# Patient Record
Sex: Female | Born: 1945
Health system: Southern US, Community
[De-identification: ages and names within clinical notes are randomized; demographics above are authoritative.]

## PROBLEM LIST (undated history)

## (undated) DIAGNOSIS — N051 Unspecified nephritic syndrome with focal and segmental glomerular lesions: Secondary | ICD-10-CM

## (undated) DIAGNOSIS — Z720 Tobacco use: Secondary | ICD-10-CM

## (undated) DIAGNOSIS — K5792 Diverticulitis of intestine, part unspecified, without perforation or abscess without bleeding: Secondary | ICD-10-CM

## (undated) DIAGNOSIS — C801 Malignant (primary) neoplasm, unspecified: Secondary | ICD-10-CM

## (undated) DIAGNOSIS — I10 Essential (primary) hypertension: Secondary | ICD-10-CM

## (undated) DIAGNOSIS — N289 Disorder of kidney and ureter, unspecified: Secondary | ICD-10-CM

## (undated) HISTORY — PX: TUBAL LIGATION: SHX77

---

## 2016-08-07 ENCOUNTER — Encounter (HOSPITAL_BASED_OUTPATIENT_CLINIC_OR_DEPARTMENT_OTHER): Payer: Self-pay | Admitting: Emergency Medicine

## 2016-08-07 ENCOUNTER — Emergency Department (HOSPITAL_BASED_OUTPATIENT_CLINIC_OR_DEPARTMENT_OTHER): Payer: Medicare Other

## 2016-08-07 ENCOUNTER — Inpatient Hospital Stay (HOSPITAL_BASED_OUTPATIENT_CLINIC_OR_DEPARTMENT_OTHER)
Admission: EM | Admit: 2016-08-07 | Discharge: 2016-08-20 | DRG: 675 | Disposition: A | Discharge status: Home / Self Care | Payer: Medicare Other | Attending: Internal Medicine | Admitting: Internal Medicine

## 2016-08-07 DIAGNOSIS — R140 Abdominal distension (gaseous): Secondary | ICD-10-CM | POA: Diagnosis not present

## 2016-08-07 DIAGNOSIS — R059 Cough, unspecified: Secondary | ICD-10-CM

## 2016-08-07 DIAGNOSIS — N269 Renal sclerosis, unspecified: Secondary | ICD-10-CM | POA: Diagnosis present

## 2016-08-07 DIAGNOSIS — E669 Obesity, unspecified: Secondary | ICD-10-CM | POA: Diagnosis present

## 2016-08-07 DIAGNOSIS — R05 Cough: Secondary | ICD-10-CM

## 2016-08-07 DIAGNOSIS — N049 Nephrotic syndrome with unspecified morphologic changes: Secondary | ICD-10-CM | POA: Diagnosis present

## 2016-08-07 DIAGNOSIS — Z720 Tobacco use: Secondary | ICD-10-CM | POA: Diagnosis present

## 2016-08-07 DIAGNOSIS — N179 Acute kidney failure, unspecified: Secondary | ICD-10-CM | POA: Diagnosis not present

## 2016-08-07 DIAGNOSIS — I10 Essential (primary) hypertension: Secondary | ICD-10-CM | POA: Diagnosis present

## 2016-08-07 DIAGNOSIS — R0609 Other forms of dyspnea: Secondary | ICD-10-CM

## 2016-08-07 DIAGNOSIS — Z6829 Body mass index (BMI) 29.0-29.9, adult: Secondary | ICD-10-CM | POA: Diagnosis not present

## 2016-08-07 DIAGNOSIS — R601 Generalized edema: Secondary | ICD-10-CM

## 2016-08-07 DIAGNOSIS — D649 Anemia, unspecified: Secondary | ICD-10-CM | POA: Diagnosis present

## 2016-08-07 DIAGNOSIS — R0600 Dyspnea, unspecified: Secondary | ICD-10-CM | POA: Diagnosis present

## 2016-08-07 DIAGNOSIS — F17210 Nicotine dependence, cigarettes, uncomplicated: Secondary | ICD-10-CM | POA: Diagnosis present

## 2016-08-07 DIAGNOSIS — R0602 Shortness of breath: Secondary | ICD-10-CM | POA: Diagnosis present

## 2016-08-07 DIAGNOSIS — I361 Nonrheumatic tricuspid (valve) insufficiency: Secondary | ICD-10-CM | POA: Diagnosis not present

## 2016-08-07 DIAGNOSIS — N19 Unspecified kidney failure: Secondary | ICD-10-CM

## 2016-08-07 DIAGNOSIS — Z79899 Other long term (current) drug therapy: Secondary | ICD-10-CM

## 2016-08-07 DIAGNOSIS — N170 Acute kidney failure with tubular necrosis: Principal | ICD-10-CM | POA: Diagnosis present

## 2016-08-07 HISTORY — DX: Tobacco use: Z72.0

## 2016-08-07 HISTORY — DX: Essential (primary) hypertension: I10

## 2016-08-07 LAB — HEPATIC FUNCTION PANEL
ALBUMIN: 1.9 g/dL — AB (ref 3.5–5.0)
ALT: 15 U/L (ref 14–54)
AST: 25 U/L (ref 15–41)
Alkaline Phosphatase: 84 U/L (ref 38–126)
BILIRUBIN DIRECT: 0.1 mg/dL (ref 0.1–0.5)
Indirect Bilirubin: 0.4 mg/dL (ref 0.3–0.9)
Total Bilirubin: 0.5 mg/dL (ref 0.3–1.2)
Total Protein: 5.4 g/dL — ABNORMAL LOW (ref 6.5–8.1)

## 2016-08-07 LAB — TROPONIN I

## 2016-08-07 LAB — CBC
HCT: 40.8 % (ref 36.0–46.0)
HEMOGLOBIN: 13.9 g/dL (ref 12.0–15.0)
MCH: 32.1 pg (ref 26.0–34.0)
MCHC: 34.1 g/dL (ref 30.0–36.0)
MCV: 94.2 fL (ref 78.0–100.0)
PLATELETS: 298 10*3/uL (ref 150–400)
RBC: 4.33 MIL/uL (ref 3.87–5.11)
RDW: 13.1 % (ref 11.5–15.5)
WBC: 7 10*3/uL (ref 4.0–10.5)

## 2016-08-07 LAB — BASIC METABOLIC PANEL
ANION GAP: 6 (ref 5–15)
BUN: 47 mg/dL — ABNORMAL HIGH (ref 6–20)
CO2: 23 mmol/L (ref 22–32)
CREATININE: 1.9 mg/dL — AB (ref 0.44–1.00)
Calcium: 7.7 mg/dL — ABNORMAL LOW (ref 8.9–10.3)
Chloride: 107 mmol/L (ref 101–111)
GFR, EST AFRICAN AMERICAN: 30 mL/min — AB (ref >60)
GFR, EST NON AFRICAN AMERICAN: 26 mL/min — AB (ref >60)
Glucose, Bld: 106 mg/dL — ABNORMAL HIGH (ref 65–99)
Potassium: 4.1 mmol/L (ref 3.5–5.1)
SODIUM: 136 mmol/L (ref 135–145)

## 2016-08-07 LAB — BRAIN NATRIURETIC PEPTIDE: B NATRIURETIC PEPTIDE 5: 42 pg/mL (ref 0.0–100.0)

## 2016-08-07 MED ORDER — CEFTRIAXONE SODIUM 1 G IJ SOLR
1.0000 g | Freq: Once | INTRAMUSCULAR | Status: AC
  Administered 2016-08-07: 1 g via INTRAVENOUS
  Filled 2016-08-07: qty 10

## 2016-08-07 MED ORDER — FUROSEMIDE 10 MG/ML IJ SOLN
40.0000 mg | Freq: Once | INTRAMUSCULAR | Status: AC
  Administered 2016-08-07: 40 mg via INTRAVENOUS
  Filled 2016-08-07: qty 4

## 2016-08-07 MED ORDER — AZITHROMYCIN 500 MG IV SOLR
INTRAVENOUS | Status: AC
  Filled 2016-08-07: qty 500

## 2016-08-07 MED ORDER — LEVOFLOXACIN IN D5W 500 MG/100ML IV SOLN: 500.0000 mg | Freq: Once | INTRAVENOUS | Status: DC

## 2016-08-07 MED ORDER — IPRATROPIUM-ALBUTEROL 0.5-2.5 (3) MG/3ML IN SOLN
3.0000 mL | Freq: Four times a day (QID) | RESPIRATORY_TRACT | Status: DC
  Administered 2016-08-07: 3 mL via RESPIRATORY_TRACT
  Filled 2016-08-07: qty 3

## 2016-08-07 MED ORDER — DEXTROSE 5 % IV SOLN
500.0000 mg | Freq: Once | INTRAVENOUS | Status: AC
  Administered 2016-08-07: 500 mg via INTRAVENOUS

## 2016-08-07 NOTE — ED Triage Notes (Addendum)
Patient reports that she has had swelling to her bilateral lower extremities x 1 week. Now has chest congestion into her throat and cough with nausea. Reports that she is more short of breath with laying down

## 2016-08-07 NOTE — ED Notes (Signed)
Patient transported to X-ray 

## 2016-08-07 NOTE — ED Provider Notes (Signed)
[facility] Provider Note   CSN: [id number] Arrival date & time: 08/07/16  1754   By signing my name below, I, Charolotte Eke, attest that this documentation has been prepared under the direction and in the presence of Charlesetta Shanks, MD. Electronically Signed: Charolotte Eke, Scribe. 08/07/16. 7:50 PM.    History   Chief Complaint Chief Complaint  Patient presents with  . Shortness of Breath  . Leg Swelling    HPI Mary Chapman is a 71 y.o. female with h/o HTN who presents to the Emergency Department complaining of moderate, persistent swelling in her ankle that began 7 days ago. Pt has never experienced anything like this before. Pt has associated SOB today, cough (multiple day, produces white foamy phlegm), congestion. Shortness of breath is exacerbated by walking. Pt states that she propped herself up on the sofa last night. PCP prescribed her 20 mg of Lasix 5 days ago and she was on it for 2 days, and then was increased to 40 for the last 3 days. Pt states that her SOB is affecting her daily activity. Pt denies CP.   The history is provided by the patient. No language interpreter was used.    Past Medical History:  Diagnosis Date  . Hypertension   . Tobacco abuse     Patient Active Problem List   Diagnosis Date Noted  . Essential hypertension 08/08/2016  . Dyspnea 08/08/2016  . Tobacco abuse   . AKI (acute kidney injury) ([place])   . Anasarca 08/07/2016    Past Surgical History:  Procedure Laterality Date  . TUBAL LIGATION      OB History    No data available       Home Medications    Prior to Admission medications   Medication Sig Start Date End Date Taking? Authorizing Provider  furosemide (LASIX) 40 MG tablet Take 40 mg by mouth 2 (two) times daily.    Yes Historical Provider, MD  hydrochlorothiazide (HYDRODIURIL) 25 MG tablet Take 25 mg by mouth daily.   Yes Historical Provider, MD    Family History Family History  Problem Relation Age of Onset    . Hypertension Mother   . Diabetes Mother   . Hypertension Father   . Diabetes Father   . Hypertension Sister   . Hypertension Brother     Social History Social History  Substance Use Topics  . Smoking status: Current Some Day Smoker    Packs/day: 0.50    Types: Cigarettes  . Smokeless tobacco: Never Used  . Alcohol use No     Allergies   Patient has no known allergies.   Review of Systems Review of Systems  Respiratory: Positive for shortness of breath.     A complete 10 system review of systems was obtained and all systems are negative except as noted in the HPI and PMH.   Physical Exam Updated Vital Signs BP 126/74 (BP Location: Right Arm)   Pulse 72   Temp 98.8 F (37.1 C) (Oral)   Resp 18   Ht 5\' 6"  (1.676 m)   Wt 190 lb 7.6 oz (86.4 kg)   SpO2 100%   BMI 30.74 kg/m   Physical Exam  Constitutional: She is oriented to person, place, and time. She appears well-developed and well-nourished.  HENT:  Head: Normocephalic and atraumatic.  Neck:  Positive hepatojugular reflex.  Cardiovascular: Normal rate, regular rhythm, normal heart sounds and intact distal pulses.  Exam reveals no gallop and no friction rub.  No murmur heard. Pulmonary/Chest: Effort normal. She has wheezes. She has rales.  Expiratory bilaterally wheezes. Rales at the bases.  Musculoskeletal: She exhibits edema.  Feet and legs 2-3+ pitting edema.  Neurological: She is alert and oriented to person, place, and time. No cranial nerve deficit. She exhibits normal muscle tone. Coordination normal.  Skin: Skin is warm and dry.  No signs of cellulitis.  Psychiatric: She has a normal mood and affect.  Nursing note and vitals reviewed.    ED Treatments / Results   DIAGNOSTIC STUDIES: Oxygen Saturation is 96% on room air, adequate by my interpretation.    COORDINATION OF CARE: 7:49 PM Discussed treatment plan with pt at bedside and pt agreed to plan, which includes labwork, potential  follow up with cardiologist.  9:49 PM Consult called to Dr Hal Hope.   Labs (all labs ordered are listed, but only abnormal results are displayed) Labs Reviewed  BASIC METABOLIC PANEL - Abnormal; Notable for the following:       Result Value   Glucose, Bld 106 (*)    BUN 47 (*)    Creatinine, Ser 1.90 (*)    Calcium 7.7 (*)    GFR calc non Af Amer 26 (*)    GFR calc Af Amer 30 (*)    All other components within normal limits  HEPATIC FUNCTION PANEL - Abnormal; Notable for the following:    Total Protein 5.4 (*)    Albumin 1.9 (*)    All other components within normal limits  BASIC METABOLIC PANEL - Abnormal; Notable for the following:    Glucose, Bld 129 (*)    BUN 45 (*)    Creatinine, Ser 1.83 (*)    Calcium 7.6 (*)    GFR calc non Af Amer 27 (*)    GFR calc Af Amer 31 (*)    All other components within normal limits  LIPID PANEL - Abnormal; Notable for the following:    Cholesterol 281 (*)    Triglycerides 196 (*)    LDL Cholesterol 185 (*)    All other components within normal limits  URINALYSIS, ROUTINE W REFLEX MICROSCOPIC - Abnormal; Notable for the following:    APPearance HAZY (*)    Glucose, UA 50 (*)    Hgb urine dipstick MODERATE (*)    Protein, ur >=300 (*)    Squamous Epithelial / LPF 0-5 (*)    All other components within normal limits  CBC - Abnormal; Notable for the following:    RBC 3.26 (*)    Hemoglobin 10.4 (*)    HCT 31.1 (*)    All other components within normal limits  COMPREHENSIVE METABOLIC PANEL - Abnormal; Notable for the following:    Glucose, Bld 104 (*)    BUN 43 (*)    Creatinine, Ser 1.68 (*)    Calcium 7.6 (*)    Total Protein 4.6 (*)    Albumin 2.3 (*)    ALT 10 (*)    Total Bilirubin 0.1 (*)    GFR calc non Af Amer 30 (*)    GFR calc Af Amer 34 (*)    All other components within normal limits  RENAL FUNCTION PANEL - Abnormal; Notable for the following:    Potassium 3.3 (*)    Glucose, Bld 100 (*)    BUN 34 (*)     Creatinine, Ser 1.22 (*)    Calcium 7.8 (*)    Phosphorus 5.0 (*)    Albumin 2.1 (*)  GFR calc non Af Amer 44 (*)    GFR calc Af Amer 51 (*)    All other components within normal limits  CBC - Abnormal; Notable for the following:    RBC 3.81 (*)    All other components within normal limits  PROTEIN ELECTROPHORESIS, SERUM - Abnormal; Notable for the following:    Total Protein ELP 4.5 (*)    Albumin ELP 2.0 (*)    All other components within normal limits  IRON AND TIBC - Abnormal; Notable for the following:    TIBC 115 (*)    All other components within normal limits  RETICULOCYTES - Abnormal; Notable for the following:    RBC. 3.81 (*)    All other components within normal limits  BASIC METABOLIC PANEL - Abnormal; Notable for the following:    BUN 32 (*)    Creatinine, Ser 1.36 (*)    Calcium 7.7 (*)    GFR calc non Af Amer 38 (*)    GFR calc Af Amer 45 (*)    All other components within normal limits  CBC - Abnormal; Notable for the following:    RBC 3.77 (*)    Hemoglobin 11.9 (*)    All other components within normal limits  RENAL FUNCTION PANEL - Abnormal; Notable for the following:    Glucose, Bld 110 (*)    BUN 42 (*)    Creatinine, Ser 1.87 (*)    Calcium 7.8 (*)    Phosphorus 5.3 (*)    Albumin 1.8 (*)    GFR calc non Af Amer 26 (*)    GFR calc Af Amer 30 (*)    All other components within normal limits  CULTURE, EXPECTORATED SPUTUM-ASSESSMENT  CULTURE, RESPIRATORY (NON-EXPECTORATED)  CBC  TROPONIN I  BRAIN NATRIURETIC PEPTIDE  CBC WITH DIFFERENTIAL/PLATELET  RAPID URINE DRUG SCREEN, HOSP PERFORMED  HIV ANTIBODY (ROUTINE TESTING)  PROTIME-INR  GLUCOSE, CAPILLARY  PROTEIN / CREATININE RATIO, URINE  ANTINUCLEAR ANTIBODIES, IFA  HEPATITIS PANEL, ACUTE  VITAMIN B12  FOLATE  FERRITIN  SURGICAL PATHOLOGY    EKG  EKG Interpretation  Date/Time:  Thursday August 07 2016 18:11:40 EDT Ventricular Rate:  83 PR Interval:  160 QRS Duration: 74 QT  Interval:  366 QTC Calculation: 430 R Axis:   33 Text Interpretation:  Normal sinus rhythm Normal ECG Confirmed by Johnney Killian, MD, Jeannie Done [phone]) on 08/07/2016 7:37:41 PM Also confirmed by Johnney Killian, [place] [phone]), editor [place]  CCT, [place] ([id number])  on 08/08/2016 6:41:21 AM       Radiology US Biopsy  Result Date: 08/11/2016 INDICATION: Necrotic center EXAM: ULTRASOUND-GUIDED BIOPSY RANDOM RENAL CORTEX CORE MEDICATIONS: None. ANESTHESIA/SEDATION: Fentanyl 50 mcg IV; Versed 1 mg IV Moderate Sedation Time:  10 The patient was continuously monitored during the procedure by the interventional radiology nurse under my direct supervision. FLUOROSCOPY TIME:  None. COMPLICATIONS: None immediate. PROCEDURE: Informed written consent was obtained from the patient after a thorough discussion of the procedural risks, benefits and alternatives. All questions were addressed. Maximal Sterile Barrier Technique was utilized including caps, mask, sterile gowns, sterile gloves, sterile drape, hand hygiene and skin antiseptic. A timeout was performed prior to the initiation of the procedure. The back was prepped with ChloraPrep in a sterile fashion, and a sterile drape was applied covering the operative field. A sterile gown and sterile gloves were used for the procedure. Under sonographic guidance, 2 16 gauge core biopsies of the left lower pole renal cortex were obtained. Final imaging was performed.  Patient tolerated the procedure well without complication. Vital sign monitoring by nursing staff during the procedure will continue as patient is in the special procedures unit for post procedure observation. FINDINGS: The images document guide needle placement within the left lower pole renal cortex. Post biopsy images demonstrate no hemorrhage. IMPRESSION: Successful ultrasound-guided core biopsy of the left lower pole renal cortex. Electronically Signed   By: Marybelle Killings M.D.   On: 08/11/2016 12:13     Procedures Procedures (including critical care time)  Medications Ordered in ED Medications  azithromycin (ZITHROMAX) 500 MG injection (not administered)  hydrALAZINE (APRESOLINE) injection 5 mg (not administered)  amLODipine (NORVASC) tablet 10 mg (10 mg Oral Given 08/12/16 0855)  albuterol (PROVENTIL) (2.5 MG/3ML) 0.083% nebulizer solution 2.5 mg (not administered)  enoxaparin (LOVENOX) injection 40 mg (40 mg Subcutaneous Given 08/12/16 0855)  sodium chloride flush (NS) 0.9 % injection 3 mL (3 mLs Intravenous Given 08/11/16 2143)  acetaminophen (TYLENOL) tablet 650 mg (650 mg Oral Given 08/09/16 1430)    Or  acetaminophen (TYLENOL) suppository 650 mg ( Rectal See Alternative 08/09/16 1430)  ondansetron (ZOFRAN) tablet 4 mg (not administered)    Or  ondansetron (ZOFRAN) injection 4 mg (not administered)  zolpidem (AMBIEN) tablet 5 mg (not administered)  nicotine (NICODERM CQ - dosed in mg/24 hours) patch 21 mg (21 mg Transdermal Patch Applied 08/12/16 1424)  azithromycin (ZITHROMAX) tablet 250 mg (250 mg Oral Given 08/12/16 1003)  ipratropium-albuterol (DUONEB) 0.5-2.5 (3) MG/3ML nebulizer solution 3 mL (3 mLs Nebulization Given 08/12/16 1320)  atorvastatin (LIPITOR) tablet 20 mg (20 mg Oral Given 08/11/16 1757)  metoprolol tartrate (LOPRESSOR) tablet 25 mg (25 mg Oral Given 08/12/16 0854)  fentaNYL (SUBLIMAZE) 100 MCG/2ML injection (not administered)  midazolam (VERSED) 2 MG/2ML injection (not administered)  lidocaine (XYLOCAINE) 1 % (with pres) injection (not administered)  torsemide (DEMADEX) tablet 100 mg (100 mg Oral Given 08/12/16 0854)  metolazone (ZAROXOLYN) tablet 5 mg (5 mg Oral Given 08/12/16 1422)  furosemide (LASIX) injection 40 mg (40 mg Intravenous Given 08/07/16 2219)  cefTRIAXone (ROCEPHIN) 1 g in dextrose 5 % 50 mL IVPB (0 g Intravenous Stopped 08/07/16 2307)  azithromycin (ZITHROMAX) 500 mg in dextrose 5 % 250 mL IVPB (0 mg Intravenous Stopped 08/08/16 0010)  potassium  chloride SA (K-DUR,KLOR-CON) CR tablet 40 mEq (40 mEq Oral Given 08/10/16 0938)  midazolam (VERSED) injection (1 mg Intravenous Given 08/11/16 1122)  fentaNYL (SUBLIMAZE) injection (50 mcg Intravenous Given 08/11/16 1122)     Initial Impression / Assessment and Plan / ED Course  I have reviewed the triage vital signs and the nursing notes.  Pertinent labs & imaging results that were available during my care of the patient were reviewed by me and considered in my medical decision making (see chart for details).     Consultation: [facility] hospitalist for admission. Dr. Rozann Lesches requests UA and IV lasix for probable nephrotic syndrome of unclear etiology.  Final Clinical Impressions(s) / ED Diagnoses   Final diagnoses:  AKI (acute kidney injury) ([place])  Anasarca  Dyspnea on exertion  Patient has renal insufficiency and diffuse edema consistent with anasarca. She has not had fever or chills. Chest x-ray and BMP do not suggest congestive heart failure. Patient is however experiencing exertional dyspnea and fatigue that are worsening despite treatment with Lasix as outpatient. Patient will be admitted for further diagnostic evaluation and increased diuresis as per discussion with Dr.Kakracandy and diagnostic evaluation for nephrotic syndrome.  New Prescriptions Current Discharge Medication List  Charlesetta Shanks, MD 08/12/16 [phone]

## 2016-08-08 ENCOUNTER — Inpatient Hospital Stay (HOSPITAL_COMMUNITY): Payer: Medicare Other

## 2016-08-08 ENCOUNTER — Encounter (HOSPITAL_COMMUNITY): Payer: Self-pay | Admitting: Internal Medicine

## 2016-08-08 DIAGNOSIS — R0600 Dyspnea, unspecified: Secondary | ICD-10-CM | POA: Diagnosis present

## 2016-08-08 DIAGNOSIS — R601 Generalized edema: Secondary | ICD-10-CM

## 2016-08-08 DIAGNOSIS — I10 Essential (primary) hypertension: Secondary | ICD-10-CM

## 2016-08-08 DIAGNOSIS — I361 Nonrheumatic tricuspid (valve) insufficiency: Secondary | ICD-10-CM

## 2016-08-08 DIAGNOSIS — N179 Acute kidney failure, unspecified: Secondary | ICD-10-CM | POA: Diagnosis present

## 2016-08-08 DIAGNOSIS — Z720 Tobacco use: Secondary | ICD-10-CM

## 2016-08-08 DIAGNOSIS — R0602 Shortness of breath: Secondary | ICD-10-CM

## 2016-08-08 LAB — EXPECTORATED SPUTUM ASSESSMENT W REFEX TO RESP CULTURE

## 2016-08-08 LAB — URINALYSIS, ROUTINE W REFLEX MICROSCOPIC
Bacteria, UA: NONE SEEN
Bilirubin Urine: NEGATIVE
Glucose, UA: 50 mg/dL — AB
KETONES UR: NEGATIVE mg/dL
Leukocytes, UA: NEGATIVE
Nitrite: NEGATIVE
Specific Gravity, Urine: 1.029 (ref 1.005–1.030)
pH: 5 (ref 5.0–8.0)

## 2016-08-08 LAB — BASIC METABOLIC PANEL
Anion gap: 9 (ref 5–15)
BUN: 45 mg/dL — AB (ref 6–20)
CHLORIDE: 102 mmol/L (ref 101–111)
CO2: 25 mmol/L (ref 22–32)
CREATININE: 1.83 mg/dL — AB (ref 0.44–1.00)
Calcium: 7.6 mg/dL — ABNORMAL LOW (ref 8.9–10.3)
GFR calc Af Amer: 31 mL/min — ABNORMAL LOW (ref >60)
GFR calc non Af Amer: 27 mL/min — ABNORMAL LOW (ref >60)
Glucose, Bld: 129 mg/dL — ABNORMAL HIGH (ref 65–99)
Potassium: 3.8 mmol/L (ref 3.5–5.1)
SODIUM: 136 mmol/L (ref 135–145)

## 2016-08-08 LAB — LIPID PANEL
CHOL/HDL RATIO: 4.9 ratio
Cholesterol: 281 mg/dL — ABNORMAL HIGH (ref 0–200)
HDL: 57 mg/dL (ref >40)
LDL CALC: 185 mg/dL — AB (ref 0–99)
Triglycerides: 196 mg/dL — ABNORMAL HIGH (ref <150)
VLDL: 39 mg/dL (ref 0–40)

## 2016-08-08 LAB — CBC WITH DIFFERENTIAL/PLATELET
Basophils Absolute: 0 10*3/uL (ref 0.0–0.1)
Basophils Relative: 0 %
EOS PCT: 2 %
Eosinophils Absolute: 0.1 10*3/uL (ref 0.0–0.7)
HCT: 39 % (ref 36.0–46.0)
Hemoglobin: 13 g/dL (ref 12.0–15.0)
LYMPHS PCT: 22 %
Lymphs Abs: 1.4 10*3/uL (ref 0.7–4.0)
MCH: 31.6 pg (ref 26.0–34.0)
MCHC: 33.3 g/dL (ref 30.0–36.0)
MCV: 94.9 fL (ref 78.0–100.0)
MONO ABS: 0.8 10*3/uL (ref 0.1–1.0)
Monocytes Relative: 12 %
Neutro Abs: 4.2 10*3/uL (ref 1.7–7.7)
Neutrophils Relative %: 64 %
PLATELETS: 292 10*3/uL (ref 150–400)
RBC: 4.11 MIL/uL (ref 3.87–5.11)
RDW: 13.3 % (ref 11.5–15.5)
WBC: 6.5 10*3/uL (ref 4.0–10.5)

## 2016-08-08 LAB — PROTEIN / CREATININE RATIO, URINE: CREATININE, URINE: 147.64 mg/dL

## 2016-08-08 LAB — EXPECTORATED SPUTUM ASSESSMENT W GRAM STAIN, RFLX TO RESP C

## 2016-08-08 LAB — RAPID URINE DRUG SCREEN, HOSP PERFORMED
AMPHETAMINES: NOT DETECTED
Barbiturates: NOT DETECTED
Benzodiazepines: NOT DETECTED
Cocaine: NOT DETECTED
Opiates: NOT DETECTED
Tetrahydrocannabinol: NOT DETECTED

## 2016-08-08 LAB — ECHOCARDIOGRAM COMPLETE
Height: 66 in
WEIGHTICAEL: 3156.99 [oz_av]

## 2016-08-08 LAB — GLUCOSE, CAPILLARY: Glucose-Capillary: 98 mg/dL (ref 65–99)

## 2016-08-08 LAB — PROTIME-INR
INR: 1.07
PROTHROMBIN TIME: 13.9 s (ref 11.4–15.2)

## 2016-08-08 LAB — HIV ANTIBODY (ROUTINE TESTING W REFLEX): HIV SCREEN 4TH GENERATION: NONREACTIVE

## 2016-08-08 MED ORDER — ATORVASTATIN CALCIUM 20 MG PO TABS
20.0000 mg | ORAL_TABLET | Freq: Every day | ORAL | Status: DC
  Administered 2016-08-08 – 2016-08-19 (×11): 20 mg via ORAL
  Filled 2016-08-08 (×11): qty 1

## 2016-08-08 MED ORDER — AZITHROMYCIN 250 MG PO TABS
250.0000 mg | ORAL_TABLET | Freq: Every day | ORAL | Status: DC
  Administered 2016-08-08 – 2016-08-14 (×7): 250 mg via ORAL
  Filled 2016-08-08 (×7): qty 1

## 2016-08-08 MED ORDER — ALBUMIN HUMAN 25 % IV SOLN
100.0000 g | Freq: Once | INTRAVENOUS | Status: DC
  Filled 2016-08-08: qty 400

## 2016-08-08 MED ORDER — IPRATROPIUM-ALBUTEROL 0.5-2.5 (3) MG/3ML IN SOLN
3.0000 mL | Freq: Four times a day (QID) | RESPIRATORY_TRACT | Status: DC
  Administered 2016-08-08 – 2016-08-14 (×23): 3 mL via RESPIRATORY_TRACT
  Filled 2016-08-08 (×25): qty 3

## 2016-08-08 MED ORDER — ACETAMINOPHEN 650 MG RE SUPP: 650.0000 mg | Freq: Four times a day (QID) | RECTAL | Status: DC | PRN

## 2016-08-08 MED ORDER — FUROSEMIDE 10 MG/ML IJ SOLN
160.0000 mg | Freq: Two times a day (BID) | INTRAMUSCULAR | Status: DC
  Administered 2016-08-08 – 2016-08-09 (×2): 160 mg via INTRAVENOUS
  Filled 2016-08-08 (×2): qty 16

## 2016-08-08 MED ORDER — ZOLPIDEM TARTRATE 5 MG PO TABS: 5.0000 mg | ORAL_TABLET | Freq: Every evening | ORAL | Status: DC | PRN

## 2016-08-08 MED ORDER — ALBUTEROL SULFATE (2.5 MG/3ML) 0.083% IN NEBU: 2.5000 mg | INHALATION_SOLUTION | RESPIRATORY_TRACT | Status: DC | PRN

## 2016-08-08 MED ORDER — ACETAMINOPHEN 325 MG PO TABS
650.0000 mg | ORAL_TABLET | Freq: Four times a day (QID) | ORAL | Status: DC | PRN
  Administered 2016-08-08 – 2016-08-16 (×6): 650 mg via ORAL
  Filled 2016-08-08 (×6): qty 2

## 2016-08-08 MED ORDER — ENOXAPARIN SODIUM 40 MG/0.4ML ~~LOC~~ SOLN
40.0000 mg | SUBCUTANEOUS | Status: DC
  Administered 2016-08-08 – 2016-08-14 (×6): 40 mg via SUBCUTANEOUS
  Filled 2016-08-08 (×6): qty 0.4

## 2016-08-08 MED ORDER — ONDANSETRON HCL 4 MG PO TABS: 4.0000 mg | ORAL_TABLET | Freq: Four times a day (QID) | ORAL | Status: DC | PRN

## 2016-08-08 MED ORDER — ONDANSETRON HCL 4 MG/2ML IJ SOLN: 4.0000 mg | Freq: Four times a day (QID) | INTRAMUSCULAR | Status: DC | PRN

## 2016-08-08 MED ORDER — NICOTINE 21 MG/24HR TD PT24
21.0000 mg | MEDICATED_PATCH | Freq: Every day | TRANSDERMAL | Status: DC
  Administered 2016-08-08 – 2016-08-15 (×7): 21 mg via TRANSDERMAL
  Filled 2016-08-08 (×10): qty 1

## 2016-08-08 MED ORDER — AMLODIPINE BESYLATE 10 MG PO TABS
10.0000 mg | ORAL_TABLET | Freq: Every day | ORAL | Status: DC
  Administered 2016-08-08 – 2016-08-15 (×8): 10 mg via ORAL
  Filled 2016-08-08 (×8): qty 1

## 2016-08-08 MED ORDER — HYDRALAZINE HCL 20 MG/ML IJ SOLN: 5.0000 mg | INTRAMUSCULAR | Status: DC | PRN

## 2016-08-08 MED ORDER — SODIUM CHLORIDE 0.9% FLUSH
3.0000 mL | Freq: Two times a day (BID) | INTRAVENOUS | Status: DC
  Administered 2016-08-09 – 2016-08-19 (×13): 3 mL via INTRAVENOUS

## 2016-08-08 NOTE — ED Notes (Signed)
Report given to Butch Penny, Therapist, sports at [facility].

## 2016-08-08 NOTE — Progress Notes (Signed)
  Echocardiogram 2D Echocardiogram has been performed.  Jennette Dubin 08/08/2016, 10:48 AM

## 2016-08-08 NOTE — Consult Note (Signed)
[facility] Renal Consultation Note  Requesting MD: Mardella Layman Indication for Consultation:  nephrotic syndrome  HPI:  Mary Chapman is a 71 y.o. female with past medical history significant for hypertension on the on HCTZ and tobacco use who apparently is quite healthy as has no records in the system including no lab work. She comes to the hospital with a one-week history of worsening lower extremity edema and shortness of breath. She also has a productive cough of clear sputum. She was seen by her PCP 4 days prior to admission and was started on low-dose Lasix without help at 20 mg, was then increase to 40 mg with still no help. Upon presentation she was found to have a creatinine level of 1.9 as well as an albumin of 1.9- again do not have any historical data. Urinalysis and protein creatinine ratio suggest significant proteinuria. She said that it really came on over night. She denies any new meds. Was able to get from her primary care physician creatinine of 0.9 in August 2017, creatinine 1.3 when she was seen on 08/02/16.  Albumin in September 2017 was 3.8 and then on 08/02/16 was 2.6  Creatinine, Ser  Date/Time Value Ref Range Status  08/08/2016 01:59 AM 1.83 (H) 0.44 - 1.00 mg/dL Final  08/07/2016 06:50 PM 1.90 (H) 0.44 - 1.00 mg/dL Final     PMHx:   Past Medical History:  Diagnosis Date  . Hypertension   . Tobacco abuse     Past Surgical History:  Procedure Laterality Date  . TUBAL LIGATION      Family Hx:  Family History  Problem Relation Age of Onset  . Hypertension Mother   . Diabetes Mother   . Hypertension Father   . Diabetes Father   . Hypertension Sister   . Hypertension Brother     Social History:  reports that she has been smoking Cigarettes.  She has been smoking about 0.50 packs per day. She has never used smokeless tobacco. She reports that she does not drink alcohol or use drugs.  Allergies: No Known Allergies  Medications: Prior to  Admission medications   Medication Sig Start Date End Date Taking? Authorizing Provider  furosemide (LASIX) 40 MG tablet Take 40 mg by mouth 2 (two) times daily.    Yes Historical Provider, MD  hydrochlorothiazide (HYDRODIURIL) 25 MG tablet Take 25 mg by mouth daily.   Yes Historical Provider, MD    I have reviewed the patient's current medications.  Labs:  Results for orders placed or performed during the hospital encounter of 08/07/16 (from the past 48 hour(s))  Basic metabolic panel     Status: Abnormal   Collection Time: 08/07/16  6:50 PM  Result Value Ref Range   Sodium 136 135 - 145 mmol/L   Potassium 4.1 3.5 - 5.1 mmol/L   Chloride 107 101 - 111 mmol/L   CO2 23 22 - 32 mmol/L   Glucose, Bld 106 (H) 65 - 99 mg/dL   BUN 47 (H) 6 - 20 mg/dL   Creatinine, Ser 1.90 (H) 0.44 - 1.00 mg/dL   Calcium 7.7 (L) 8.9 - 10.3 mg/dL   GFR calc non Af Amer 26 (L) >60 mL/min   GFR calc Af Amer 30 (L) >60 mL/min    Comment: (NOTE) The eGFR has been calculated using the CKD EPI equation. This calculation has not been validated in all clinical situations. eGFR's persistently <60 mL/min signify possible Chronic Kidney Disease.    Anion gap 6 5 -  15  CBC     Status: None   Collection Time: 08/07/16  6:50 PM  Result Value Ref Range   WBC 7.0 4.0 - 10.5 K/uL   RBC 4.33 3.87 - 5.11 MIL/uL   Hemoglobin 13.9 12.0 - 15.0 g/dL   HCT 40.8 36.0 - 46.0 %   MCV 94.2 78.0 - 100.0 fL   MCH 32.1 26.0 - 34.0 pg   MCHC 34.1 30.0 - 36.0 g/dL   RDW 13.1 11.5 - 15.5 %   Platelets 298 150 - 400 K/uL  Troponin I     Status: None   Collection Time: 08/07/16  6:50 PM  Result Value Ref Range   Troponin I <0.03 <0.03 ng/mL  Brain natriuretic peptide     Status: None   Collection Time: 08/07/16  6:50 PM  Result Value Ref Range   B Natriuretic Peptide 42.0 0.0 - 100.0 pg/mL  Hepatic function panel     Status: Abnormal   Collection Time: 08/07/16  6:50 PM  Result Value Ref Range   Total Protein 5.4 (L) 6.5 -  8.1 g/dL   Albumin 1.9 (L) 3.5 - 5.0 g/dL   AST 25 15 - 41 U/L   ALT 15 14 - 54 U/L   Alkaline Phosphatase 84 38 - 126 U/L   Total Bilirubin 0.5 0.3 - 1.2 mg/dL   Bilirubin, Direct 0.1 0.1 - 0.5 mg/dL   Indirect Bilirubin 0.4 0.3 - 0.9 mg/dL  CBC with Differential/Platelet     Status: None   Collection Time: 08/08/16  1:59 AM  Result Value Ref Range   WBC 6.5 4.0 - 10.5 K/uL   RBC 4.11 3.87 - 5.11 MIL/uL   Hemoglobin 13.0 12.0 - 15.0 g/dL   HCT 39.0 36.0 - 46.0 %   MCV 94.9 78.0 - 100.0 fL   MCH 31.6 26.0 - 34.0 pg   MCHC 33.3 30.0 - 36.0 g/dL   RDW 13.3 11.5 - 15.5 %   Platelets 292 150 - 400 K/uL   Neutrophils Relative % 64 %   Neutro Abs 4.2 1.7 - 7.7 K/uL   Lymphocytes Relative 22 %   Lymphs Abs 1.4 0.7 - 4.0 K/uL   Monocytes Relative 12 %   Monocytes Absolute 0.8 0.1 - 1.0 K/uL   Eosinophils Relative 2 %   Eosinophils Absolute 0.1 0.0 - 0.7 K/uL   Basophils Relative 0 %   Basophils Absolute 0.0 0.0 - 0.1 K/uL  HIV antibody     Status: None   Collection Time: 08/08/16  1:59 AM  Result Value Ref Range   HIV Screen 4th Generation wRfx Non Reactive Non Reactive    Comment: (NOTE) Performed At: [facility] [place], [place] [id number] Lindon Romp MD LG:[id number]   Basic metabolic panel     Status: Abnormal   Collection Time: 08/08/16  1:59 AM  Result Value Ref Range   Sodium 136 135 - 145 mmol/L   Potassium 3.8 3.5 - 5.1 mmol/L   Chloride 102 101 - 111 mmol/L   CO2 25 22 - 32 mmol/L   Glucose, Bld 129 (H) 65 - 99 mg/dL   BUN 45 (H) 6 - 20 mg/dL   Creatinine, Ser 1.83 (H) 0.44 - 1.00 mg/dL   Calcium 7.6 (L) 8.9 - 10.3 mg/dL   GFR calc non Af Amer 27 (L) >60 mL/min   GFR calc Af Amer 31 (L) >60 mL/min    Comment: (NOTE) The eGFR has been  calculated using the CKD EPI equation. This calculation has not been validated in all clinical situations. eGFR's persistently <60 mL/min signify possible Chronic Kidney Disease.    Anion gap 9 5 -  15  Protime-INR     Status: None   Collection Time: 08/08/16  1:59 AM  Result Value Ref Range   Prothrombin Time 13.9 11.4 - 15.2 seconds   INR 1.07   Lipid panel     Status: Abnormal   Collection Time: 08/08/16  1:59 AM  Result Value Ref Range   Cholesterol 281 (H) 0 - 200 mg/dL   Triglycerides 196 (H) <150 mg/dL   HDL 57 >40 mg/dL   Total CHOL/HDL Ratio 4.9 RATIO   VLDL 39 0 - 40 mg/dL   LDL Cholesterol 185 (H) 0 - 99 mg/dL    Comment:        Total Cholesterol/HDL:CHD Risk Coronary Heart Disease Risk Table                     Men   Women  1/2 Average Risk   3.4   3.3  Average Risk       5.0   4.4  2 X Average Risk   9.6   7.1  3 X Average Risk  23.4   11.0        Use the calculated Patient Ratio above and the CHD Risk Table to determine the patient's CHD Risk.        ATP III CLASSIFICATION (LDL):  <100     mg/dL   Optimal  100-129  mg/dL   Near or Above                    Optimal  130-159  mg/dL   Borderline  160-189  mg/dL   High  >190     mg/dL   Very High   Culture, expectorated sputum-assessment     Status: None   Collection Time: 08/08/16  4:02 AM  Result Value Ref Range   Specimen Description EXPECTORATED SPUTUM    Special Requests NONE    Sputum evaluation THIS SPECIMEN IS ACCEPTABLE FOR SPUTUM CULTURE    Report Status 08/08/2016 FINAL   Culture, respiratory (NON-Expectorated)     Status: None (Preliminary result)   Collection Time: 08/08/16  4:02 AM  Result Value Ref Range   Specimen Description EXPECTORATED SPUTUM    Special Requests NONE Reflexed from [id number]    Gram Stain      FEW SQUAMOUS EPITHELIAL CELLS PRESENT FEW WBC PRESENT, PREDOMINANTLY MONONUCLEAR RARE GRAM POSITIVE COCCI IN PAIRS    Culture PENDING    Report Status PENDING   Glucose, capillary     Status: None   Collection Time: 08/08/16  6:23 AM  Result Value Ref Range   Glucose-Capillary 98 65 - 99 mg/dL   Comment 1 Notify RN    Comment 2 Document in Chart   Rapid urine drug screen  (hospital performed)     Status: None   Collection Time: 08/08/16 11:28 AM  Result Value Ref Range   Opiates NONE DETECTED NONE DETECTED   Cocaine NONE DETECTED NONE DETECTED   Benzodiazepines NONE DETECTED NONE DETECTED   Amphetamines NONE DETECTED NONE DETECTED   Tetrahydrocannabinol NONE DETECTED NONE DETECTED   Barbiturates NONE DETECTED NONE DETECTED    Comment:        DRUG SCREEN FOR MEDICAL PURPOSES ONLY.  IF CONFIRMATION IS NEEDED FOR ANY PURPOSE, NOTIFY LAB WITHIN  5 DAYS.        LOWEST DETECTABLE LIMITS FOR URINE DRUG SCREEN Drug Class       Cutoff (ng/mL) Amphetamine      1000 Barbiturate      200 Benzodiazepine   419 Tricyclics       622 Opiates          300 Cocaine          300 THC              50   Protein / creatinine ratio, urine     Status: None   Collection Time: 08/08/16 11:28 AM  Result Value Ref Range   Creatinine, Urine 147.64 mg/dL   Total Protein, Urine >3,000.0 mg/dL    Comment: RESULTS CONFIRMED BY MANUAL DILUTION NO NORMAL RANGE ESTABLISHED FOR THIS TEST    Protein Creatinine Ratio        0.00 - 0.15 mg/mg[Cre]    Comment: RESULT BELOW REPORTABLE RANGE, UNABLE TO CALCULATE.   Urinalysis, Routine w reflex microscopic     Status: Abnormal   Collection Time: 08/08/16 11:28 AM  Result Value Ref Range   Color, Urine YELLOW YELLOW   APPearance HAZY (A) CLEAR   Specific Gravity, Urine 1.029 1.005 - 1.030   pH 5.0 5.0 - 8.0   Glucose, UA 50 (A) NEGATIVE mg/dL   Hgb urine dipstick MODERATE (A) NEGATIVE   Bilirubin Urine NEGATIVE NEGATIVE   Ketones, ur NEGATIVE NEGATIVE mg/dL   Protein, ur >=300 (A) NEGATIVE mg/dL   Nitrite NEGATIVE NEGATIVE   Leukocytes, UA NEGATIVE NEGATIVE   RBC / HPF 6-30 0 - 5 RBC/hpf   WBC, UA 0-5 0 - 5 WBC/hpf   Bacteria, UA NONE SEEN NONE SEEN   Squamous Epithelial / LPF 0-5 (A) NONE SEEN   Mucous PRESENT    Hyaline Casts, UA PRESENT    Granular Casts, UA PRESENT      ROS:  A comprehensive review of systems was  negative except for: Constitutional: positive for fatigue Respiratory: positive for dyspnea on exertion and wheezing Cardiovascular: positive for lower extremity edema  Physical Exam: Vitals:   08/08/16 0544 08/08/16 1430  BP: (!) 150/78 (!) 142/74  Pulse: 84 (!) 101  Resp: 18 18  Temp: 98.3 F (36.8 C) 99 F (37.2 C)     General: pleasant, c/o headache HEENT: Pupils are equally round and reactive to light, extraocular motions are intact, mucous membranes are moist Neck: Positive for JVD Heart: Tachycardic Lungs: Inspiratory and expiratory wheezes with also crackles bilaterally Abdomen: Obese, soft, nontender, nondistended Extremities: Pitting edema up to the level of the thighs Skin: Warm and dry Neuro: Alert and nonfocal  Assessment/Plan: 71 year old black female with very little past medical history presenting with what appears to be nephrotic syndrome with some acute kidney injury 1.Renal- normal creatinine in August 2017, creatinine of 1.3 last week, now creatinine of 1.9. Most likely result of this intrarenal process. Also with proteinuria which appears to be nephrotic associated with hypoalbuminemia. Patient will need a percutaneous kidney biopsy to make a diagnosis of the etiology of this.  Will also do a little bit of a serologic workup including hepatitis, HIV (was neg), ANA.  Also in the differential would be membranous GN which can sometimes be associated with malignancy so I did discuss that with the patient that she needs to have age appropriate cancer screening- she said she was behind on her mammogram, actually has a colonoscopy scheduled for next month but  has not had a Pap smear in quite some time.  Chest x-ray did not show any obvious abnormalities 2. Hypertension/volume  - volume overloaded in the setting of nephrotic syndrome. We'll give high-dose IV Lasix 3. Anemia  - not an issue at this time   Alica Shellhammer A 08/08/2016, 3:39 PM

## 2016-08-08 NOTE — Progress Notes (Signed)
[facility] PROGRESS NOTE  Mary Chapman ZDG:[id number] DOB: April 09, 1946 DOA: 08/07/2016  PCP: [facility]  Brief History/Interval Summary: 71 year old African-American female with a past medical history of hypertension who presented with complaints of shortness of breath and leg swelling. Her symptoms started about a week ago with leg swelling. She was given diuretics by her primary care provider with no improvement. She subsequently decided to come into the hospital. She was found to have elevated creatinine. She was hospitalized for further management.  Reason for Visit: Anasarca and possible acute renal failure.  Consultants: Nephrology  Procedures:   Transthoracic echocardiogram Study Conclusions  - Left ventricle: The cavity size was normal. Systolic function was   vigorous. The estimated ejection fraction was in the range of 65%   to 70%. Wall motion was normal; there were no regional wall   motion abnormalities. Doppler parameters are consistent with   abnormal left ventricular relaxation (grade 1 diastolic   dysfunction). There was no evidence of elevated ventricular   filling pressure by Doppler parameters. - Aortic valve: There was no regurgitation. - Aortic root: The aortic root was normal in size. - Mitral valve: There was mild regurgitation. - Right ventricle: Systolic function was normal. - Right atrium: The atrium was normal in size. - Tricuspid valve: There was mild regurgitation. - Pulmonic valve: There was no regurgitation. - Pulmonary arteries: Systolic pressure was within the normal   range. - Inferior vena cava: The vessel was dilated. The respirophasic   diameter changes were blunted (< 50%), consistent with elevated   central venous pressure. - Pericardium, extracardiac: There was no pericardial effusion.  Impressions: - Consider non-cardiac causes for anasarca and dyspnea.  Antibiotics: None  Subjective/Interval  History: Patient states that the leg edema has not improved any. Breathing is better. Denies any chest pain.  ROS: Denies any nausea or vomiting.  Objective:  Vital Signs  Vitals:   08/07/16 2236 08/08/16 0000 08/08/16 0116 08/08/16 0544  BP: (!) 159/86 (!) 150/84 (!) 157/76 (!) 150/78  Pulse: 83 86 82 84  Resp: 14 17 18 18   Temp: 98.5 F (36.9 C)  98.6 F (37 C) 98.3 F (36.8 C)  TempSrc: Oral  Oral Oral  SpO2: 97% 98% 97% 100%  Weight:   89.6 kg (197 lb 8 oz) 89.5 kg (197 lb 5 oz)  Height:   5\' 6"  (1.676 m)     Intake/Output Summary (Last 24 hours) at 08/08/16 1421 Last data filed at 08/08/16 1102  Gross per 24 hour  Intake              420 ml  Output                0 ml  Net              420 ml   Filed Weights   08/07/16 1804 08/08/16 0116 08/08/16 0544  Weight: 83.9 kg (185 lb) 89.6 kg (197 lb 8 oz) 89.5 kg (197 lb 5 oz)    General appearance: alert, cooperative, appears stated age and no distress Resp: Somewhat decreased air entry at the bases but no crackles, wheezing or rhonchi. Cardio: regular rate and rhythm, S1, S2 normal, no murmur, click, rub or gallop GI: soft, non-tender; bowel sounds normal; no masses,  no organomegaly Extremities: Pitting edema noted bilateral lower extremities Neurologic: No focal deficits.  Lab Results:  Data Reviewed: I have personally reviewed following labs and imaging studies  CBC:  Recent Labs  Lab 08/07/16 1850 08/08/16 0159  WBC 7.0 6.5  NEUTROABS  --  4.2  HGB 13.9 13.0  HCT 40.8 39.0  MCV 94.2 94.9  PLT 298 960    Basic Metabolic Panel:  Recent Labs Lab 08/07/16 1850 08/08/16 0159  NA 136 136  K 4.1 3.8  CL 107 102  CO2 23 25  GLUCOSE 106* 129*  BUN 47* 45*  CREATININE 1.90* 1.83*  CALCIUM 7.7* 7.6*    GFR: Estimated Creatinine Clearance: 32.2 mL/min (A) (by C-G formula based on SCr of 1.83 mg/dL (H)).  Liver Function Tests:  Recent Labs Lab 08/07/16 1850  AST 25  ALT 15  ALKPHOS 84   BILITOT 0.5  PROT 5.4*  ALBUMIN 1.9*    Coagulation Profile:  Recent Labs Lab 08/08/16 0159  INR 1.07    Cardiac Enzymes:  Recent Labs Lab 08/07/16 1850  TROPONINI <0.03    CBG:  Recent Labs Lab 08/08/16 0623  GLUCAP 98    Lipid Profile:  Recent Labs  08/08/16 0159  CHOL 281*  HDL 57  LDLCALC 185*  TRIG 196*  CHOLHDL 4.9     Recent Results (from the past 240 hour(s))  Culture, expectorated sputum-assessment     Status: None   Collection Time: 08/08/16  4:02 AM  Result Value Ref Range Status   Specimen Description EXPECTORATED SPUTUM  Final   Special Requests NONE  Final   Sputum evaluation THIS SPECIMEN IS ACCEPTABLE FOR SPUTUM CULTURE  Final   Report Status 08/08/2016 FINAL  Final  Culture, respiratory (NON-Expectorated)     Status: None (Preliminary result)   Collection Time: 08/08/16  4:02 AM  Result Value Ref Range Status   Specimen Description EXPECTORATED SPUTUM  Final   Special Requests NONE Reflexed from [id number]  Final   Gram Stain   Final    FEW SQUAMOUS EPITHELIAL CELLS PRESENT FEW WBC PRESENT, PREDOMINANTLY MONONUCLEAR RARE GRAM POSITIVE COCCI IN PAIRS    Culture PENDING  Incomplete   Report Status PENDING  Incomplete      Radiology Studies: Ct Abdomen Pelvis Wo Contrast  Result Date: 08/07/2016 CLINICAL DATA:  Swelling in the bilateral lower extremities with chest congestion EXAM: CT CHEST, ABDOMEN AND PELVIS WITHOUT CONTRAST TECHNIQUE: Multidetector CT imaging of the chest, abdomen and pelvis was performed following the standard protocol without IV contrast. COMPARISON:  Chest x-ray 08/07/2016 FINDINGS: CT CHEST FINDINGS Cardiovascular: Limited evaluation without intravenous contrast. Atherosclerotic calcifications. Non aneurysmal aorta. Coronary artery calcifications. The heart is nonenlarged. Trace pericardial effusion. Mediastinum/Nodes: Midline trachea. Thyroid grossly normal. The esophagus is within normal limits. Lungs/Pleura:  Mild to moderate emphysema. Small right-sided pleural effusion with suspected infiltrate in the right lower lobe. No pneumothorax. Musculoskeletal: No acute or suspicious bone lesion. CT ABDOMEN PELVIS FINDINGS Hepatobiliary: No focal hepatic abnormality. Calcified gallstones. No biliary dilatation Pancreas: Unremarkable. No pancreatic ductal dilatation or surrounding inflammatory changes. Spleen: Normal in size without focal abnormality. Adrenals/Urinary Tract: Adrenal glands are within normal limits. No hydronephrosis. Two adjacent stones in the mid left kidney, both measuring 6 mm. The bladder is unremarkable. Stomach/Bowel: Stomach is within normal limits. Appendix appears normal. No evidence of bowel wall thickening, distention, or inflammatory changes. Numerous colon diverticula without acute inflammation. Vascular/Lymphatic: Aortic atherosclerosis. No enlarged abdominal or pelvic lymph nodes. Reproductive: Uterus and bilateral adnexa are unremarkable. Other: Small amount of free fluid within the pelvis. Musculoskeletal: No acute or suspicious bone lesion. Degenerative changes. Mild diffuse subcutaneous edema. IMPRESSION: 1. Small right-sided pleural effusion with  suspected right lower lobe pneumonia. 2. Gallstones 3. Nonobstructing stones in the left kidney 4. Colon diverticular disease without acute inflammation 5. Small amount of free fluid in the pelvis. Mild diffuse subcutaneous edema. Electronically Signed   By: Donavan Foil M.D.   On: 08/07/2016 21:11   Dg Chest 2 View  Result Date: 08/07/2016 CLINICAL DATA:  Acute onset of shortness of breath, cough and chest congestion. Nausea. Bilateral lower extremity swelling. Initial encounter. EXAM: CHEST  2 VIEW COMPARISON:  None. FINDINGS: The lungs are well-aerated. A small right pleural effusion is noted. Mild bibasilar opacities could reflect minimal interstitial edema. No pneumothorax is seen. The heart is normal in size; the mediastinal contour is  within normal limits. No acute osseous abnormalities are seen. IMPRESSION: Small right pleural effusion. Mild bibasilar opacities could reflect minimal interstitial edema. Electronically Signed   By: Garald Balding M.D.   On: 08/07/2016 18:50   Ct Chest Wo Contrast  Result Date: 08/07/2016 CLINICAL DATA:  Swelling in the bilateral lower extremities with chest congestion EXAM: CT CHEST, ABDOMEN AND PELVIS WITHOUT CONTRAST TECHNIQUE: Multidetector CT imaging of the chest, abdomen and pelvis was performed following the standard protocol without IV contrast. COMPARISON:  Chest x-ray 08/07/2016 FINDINGS: CT CHEST FINDINGS Cardiovascular: Limited evaluation without intravenous contrast. Atherosclerotic calcifications. Non aneurysmal aorta. Coronary artery calcifications. The heart is nonenlarged. Trace pericardial effusion. Mediastinum/Nodes: Midline trachea. Thyroid grossly normal. The esophagus is within normal limits. Lungs/Pleura: Mild to moderate emphysema. Small right-sided pleural effusion with suspected infiltrate in the right lower lobe. No pneumothorax. Musculoskeletal: No acute or suspicious bone lesion. CT ABDOMEN PELVIS FINDINGS Hepatobiliary: No focal hepatic abnormality. Calcified gallstones. No biliary dilatation Pancreas: Unremarkable. No pancreatic ductal dilatation or surrounding inflammatory changes. Spleen: Normal in size without focal abnormality. Adrenals/Urinary Tract: Adrenal glands are within normal limits. No hydronephrosis. Two adjacent stones in the mid left kidney, both measuring 6 mm. The bladder is unremarkable. Stomach/Bowel: Stomach is within normal limits. Appendix appears normal. No evidence of bowel wall thickening, distention, or inflammatory changes. Numerous colon diverticula without acute inflammation. Vascular/Lymphatic: Aortic atherosclerosis. No enlarged abdominal or pelvic lymph nodes. Reproductive: Uterus and bilateral adnexa are unremarkable. Other: Small amount of free  fluid within the pelvis. Musculoskeletal: No acute or suspicious bone lesion. Degenerative changes. Mild diffuse subcutaneous edema. IMPRESSION: 1. Small right-sided pleural effusion with suspected right lower lobe pneumonia. 2. Gallstones 3. Nonobstructing stones in the left kidney 4. Colon diverticular disease without acute inflammation 5. Small amount of free fluid in the pelvis. Mild diffuse subcutaneous edema. Electronically Signed   By: Donavan Foil M.D.   On: 08/07/2016 21:11   US Renal  Result Date: 08/08/2016 CLINICAL DATA:  Acute renal injury. EXAM: RENAL / URINARY TRACT ULTRASOUND COMPLETE COMPARISON:  CT abdomen dated 08/07/2016. FINDINGS: Right Kidney: Length: 12.4 cm. Echogenicity within normal limits. No mass or hydronephrosis visualized. Left Kidney: Length: 12.4 cm. Echogenicity within normal limits. No mass or hydronephrosis visualized. 4 mm left renal stone, compatible with 1 of the 2 similar size stones seen within the left renal pelvis on a recent CT. Bladder: Bladder is decompressed. IMPRESSION: 1. Left nephrolithiasis. Left kidney is otherwise unremarkable. No hydronephrosis. 2. Right kidney appears normal. 3. Bladder is decompressed. Electronically Signed   By: Franki Cabot M.D.   On: 08/08/2016 12:23     Medications:  Scheduled: . albumin human  100 g Intravenous Once  . amLODipine  10 mg Oral Daily  . azithromycin  250 mg Oral Daily  .  enoxaparin (LOVENOX) injection  40 mg Subcutaneous Q24H  . ipratropium-albuterol  3 mL Nebulization QID  . nicotine  21 mg Transdermal Daily  . sodium chloride flush  3 mL Intravenous Q12H   Continuous:  JSE:[id number] **OR** acetaminophen, albuterol, hydrALAZINE, ondansetron **OR** ondansetron (ZOFRAN) IV, zolpidem  Assessment/Plan:  Principal Problem:   Anasarca Active Problems:   Tobacco abuse   Essential hypertension   AKI (acute kidney injury) ([place])   Dyspnea    Anasarca Patient's echocardiogram shows normal  systolic function. LFTs are normal. No history of liver disease. No history of alcohol intake. UA does show significant proteinuria. Anasarca could be due to renal disease. Nephrology has been consulted. She was given a dose of Lasix last night. Further decisions have been red for now. She was given albumin. Defer diuretics with nephrology at this time. Strict ins and outs. Daily weights.   AKI (acute kidney injury) No previously known history of kidney disease. She does have a history of hypertension but is on only one medication at home. Is followed regularly by her primary care provider. Creatinine noted to be elevated. UA shows proteinuria. Nephrology to see patient.  Essential hypertension  Monitor blood pressure. Started on amlodipine since her diuretics are being held.  Tobacco abuse Patient was counseled. Nicotine patch.   Dyspnea Etiology is not clear. Likely due to anasarca. Chest x-ray and CT of chest that showed possible right lower lobe infiltration, but patient does not have fever or leukocytosis. Clinically does not seem to have pneumonia. Patient received one dose of Rocephin and azithromycin by IV in ED. hold off on further doses and follow clinically for now. Dyspnea could be due to some element of fluid overload.  DVT Prophylaxis: Lovenox    Code Status: Full code  Family Communication: Discussed with the patient and her daughter  Disposition Plan: Management as outlined above.    LOS: 1 day   [facility] Pager [phone] 08/08/2016, 2:21 PM  If 7PM-7AM, please contact night-coverage at [email or web address], password [facility]

## 2016-08-08 NOTE — H&P (Signed)
History and Physical    Mary Chapman ION:[id number] DOB: 11-30-1945 DOA: 08/07/2016  Referring MD/NP/PA:   PCP: [facility]   Patient coming from:  The patient is coming from home.  At baseline, pt is independent for most of ADL  Chief Complaint: Shortness of breath, leg edema  HPI: Mary Chapman is a 71 y.o. female with medical history significant of hypertension, tobacco abuse, who presents with shortness of breath and leg edema.  Patient states that her shortness of breath started one week ago. She has cough with white and foamy mucus production. Denies chest pain, fever or chills. Patient states that she also has bilateral leg edema, which has been progressively getting worse, now extended to abdomen. She was seen by PCP on Saturday, started with low-dose Lasix 20 mg daily without help, then increase to 40 mg on Tuesday, still without significant help. Patient states she has decreased her urine output. No dysuria, burning on urination. No hematuria. Patient has nausea, but no vomiting, diarrhea, abdominal pain. No unilateral weakness. No yellow skin or eyes.  ED Course: pt was found to have BNP 42.0, negative troponin, normal liver transaminases, albeit take 1.9, acute renal injury with creatinine 1.90, pending urinalysis.  CXR: small right pleural effusion. Mild bibasilar opacities could reflect minimal interstitial edema  CT-abd/pelvis and CT-chest: 1. Small right-sided pleural effusion with suspected right lower lobe pneumonia. 2. Gallstones 3. Nonobstructing stones in the left kidney 4. Colon diverticular disease without acute inflammation 5. Small amount of free fluid in the pelvis. Mild diffuse subcutaneous edema.  Review of Systems:   General: no fevers, chills, no changes in body weight, has poor appetite, has fatigue HEENT: no blurry vision, hearing changes or sore throat Respiratory: has dyspnea, coughing, no wheezing CV: no chest pain, no palpitations GI: has  nausea, no vomiting, abdominal pain, diarrhea, constipation GU: no dysuria, burning on urination, increased urinary frequency, hematuria  Ext: has leg edema Neuro: no unilateral weakness, numbness, or tingling, no vision change or hearing loss Skin: no rash, no skin tear. MSK: No muscle spasm, no deformity, no limitation of range of movement in spin Heme: No easy bruising.  Travel history: No recent long distant travel.  Allergy: No Known Allergies  Past Medical History:  Diagnosis Date  . Hypertension   . Tobacco abuse     Past Surgical History:  Procedure Laterality Date  . TUBAL LIGATION      Social History:  reports that she has been smoking.  She has never used smokeless tobacco. She reports that she does not use drugs. Her alcohol history is not on file.  Family History:  Family History  Problem Relation Age of Onset  . Hypertension Mother   . Diabetes Mother   . Hypertension Father   . Diabetes Father   . Hypertension Sister   . Hypertension Brother      Prior to Admission medications   Medication Sig Start Date End Date Taking? Authorizing Provider  furosemide (LASIX) 40 MG tablet Take 40 mg by mouth.   Yes Historical Provider, MD  hydrochlorothiazide (HYDRODIURIL) 25 MG tablet Take 25 mg by mouth daily.   Yes Historical Provider, MD    Physical Exam: Vitals:   08/07/16 2007 08/07/16 2236 08/08/16 0000 08/08/16 0116  BP:  (!) 159/86 (!) 150/84 (!) 157/76  Pulse:  83 86 82  Resp:  14 17 18   Temp:  98.5 F (36.9 C)  98.6 F (37 C)  TempSrc:  Oral  Oral  SpO2: 98% 97% 98% 97%  Weight:    89.6 kg (197 lb 8 oz)  Height:    5\' 6"  (1.676 m)   General: Not in acute distress. Dry mucus and membrane. HEENT:       Eyes: PERRL, EOMI, no scleral icterus.       ENT: No discharge from the ears and nose, no pharynx injection, no tonsillar enlargement.        Neck: No JVD, no bruit, no mass felt. Heme: No neck lymph node enlargement. Cardiac: S1/S2, RRR, No  murmurs, No gallops or rubs. Respiratory: has rhonchi. No rales or rubs. GI: Soft, nondistended, nontender, no rebound pain, no organomegaly, BS present. GU: No hematuria Ext: 3+ pitting leg edema bilaterally. 2+DP/PT pulse bilaterally. Musculoskeletal: No joint deformities, No joint redness or warmth, no limitation of ROM in spin. Skin: No rashes.  Neuro: Alert, oriented X3, cranial nerves II-XII grossly intact, moves all extremities normally. Psych: Patient is not psychotic, no suicidal or hemocidal ideation.  Labs on Admission: I have personally reviewed following labs and imaging studies  CBC:  Recent Labs Lab 08/07/16 1850  WBC 7.0  HGB 13.9  HCT 40.8  MCV 94.2  PLT 782   Basic Metabolic Panel:  Recent Labs Lab 08/07/16 1850  NA 136  K 4.1  CL 107  CO2 23  GLUCOSE 106*  BUN 47*  CREATININE 1.90*  CALCIUM 7.7*   GFR: Estimated Creatinine Clearance: 31.1 mL/min (A) (by C-G formula based on SCr of 1.9 mg/dL (H)). Liver Function Tests:  Recent Labs Lab 08/07/16 1850  AST 25  ALT 15  ALKPHOS 84  BILITOT 0.5  PROT 5.4*  ALBUMIN 1.9*   No results for input(s): LIPASE, AMYLASE in the last 168 hours. No results for input(s): AMMONIA in the last 168 hours. Coagulation Profile: No results for input(s): INR, PROTIME in the last 168 hours. Cardiac Enzymes:  Recent Labs Lab 08/07/16 1850  TROPONINI <0.03   BNP (last 3 results) No results for input(s): PROBNP in the last 8760 hours. HbA1C: No results for input(s): HGBA1C in the last 72 hours. CBG: No results for input(s): GLUCAP in the last 168 hours. Lipid Profile: No results for input(s): CHOL, HDL, LDLCALC, TRIG, CHOLHDL, LDLDIRECT in the last 72 hours. Thyroid Function Tests: No results for input(s): TSH, T4TOTAL, FREET4, T3FREE, THYROIDAB in the last 72 hours. Anemia Panel: No results for input(s): VITAMINB12, FOLATE, FERRITIN, TIBC, IRON, RETICCTPCT in the last 72 hours. Urine analysis: No  results found for: COLORURINE, APPEARANCEUR, LABSPEC, PHURINE, GLUCOSEU, HGBUR, BILIRUBINUR, KETONESUR, PROTEINUR, UROBILINOGEN, NITRITE, LEUKOCYTESUR Sepsis Labs: @LABRCNTIP (procalcitonin:4,lacticidven:4) )No results found for this or any previous visit (from the past 240 hour(s)).   Radiological Exams on Admission: Ct Abdomen Pelvis Wo Contrast  Result Date: 08/07/2016 CLINICAL DATA:  Swelling in the bilateral lower extremities with chest congestion EXAM: CT CHEST, ABDOMEN AND PELVIS WITHOUT CONTRAST TECHNIQUE: Multidetector CT imaging of the chest, abdomen and pelvis was performed following the standard protocol without IV contrast. COMPARISON:  Chest x-ray 08/07/2016 FINDINGS: CT CHEST FINDINGS Cardiovascular: Limited evaluation without intravenous contrast. Atherosclerotic calcifications. Non aneurysmal aorta. Coronary artery calcifications. The heart is nonenlarged. Trace pericardial effusion. Mediastinum/Nodes: Midline trachea. Thyroid grossly normal. The esophagus is within normal limits. Lungs/Pleura: Mild to moderate emphysema. Small right-sided pleural effusion with suspected infiltrate in the right lower lobe. No pneumothorax. Musculoskeletal: No acute or suspicious bone lesion. CT ABDOMEN PELVIS FINDINGS Hepatobiliary: No focal hepatic abnormality. Calcified gallstones. No biliary dilatation Pancreas: Unremarkable. No  pancreatic ductal dilatation or surrounding inflammatory changes. Spleen: Normal in size without focal abnormality. Adrenals/Urinary Tract: Adrenal glands are within normal limits. No hydronephrosis. Two adjacent stones in the mid left kidney, both measuring 6 mm. The bladder is unremarkable. Stomach/Bowel: Stomach is within normal limits. Appendix appears normal. No evidence of bowel wall thickening, distention, or inflammatory changes. Numerous colon diverticula without acute inflammation. Vascular/Lymphatic: Aortic atherosclerosis. No enlarged abdominal or pelvic lymph nodes.  Reproductive: Uterus and bilateral adnexa are unremarkable. Other: Small amount of free fluid within the pelvis. Musculoskeletal: No acute or suspicious bone lesion. Degenerative changes. Mild diffuse subcutaneous edema. IMPRESSION: 1. Small right-sided pleural effusion with suspected right lower lobe pneumonia. 2. Gallstones 3. Nonobstructing stones in the left kidney 4. Colon diverticular disease without acute inflammation 5. Small amount of free fluid in the pelvis. Mild diffuse subcutaneous edema. Electronically Signed   By: Donavan Foil M.D.   On: 08/07/2016 21:11   Dg Chest 2 View  Result Date: 08/07/2016 CLINICAL DATA:  Acute onset of shortness of breath, cough and chest congestion. Nausea. Bilateral lower extremity swelling. Initial encounter. EXAM: CHEST  2 VIEW COMPARISON:  None. FINDINGS: The lungs are well-aerated. A small right pleural effusion is noted. Mild bibasilar opacities could reflect minimal interstitial edema. No pneumothorax is seen. The heart is normal in size; the mediastinal contour is within normal limits. No acute osseous abnormalities are seen. IMPRESSION: Small right pleural effusion. Mild bibasilar opacities could reflect minimal interstitial edema. Electronically Signed   By: Garald Balding M.D.   On: 08/07/2016 18:50   Ct Chest Wo Contrast  Result Date: 08/07/2016 CLINICAL DATA:  Swelling in the bilateral lower extremities with chest congestion EXAM: CT CHEST, ABDOMEN AND PELVIS WITHOUT CONTRAST TECHNIQUE: Multidetector CT imaging of the chest, abdomen and pelvis was performed following the standard protocol without IV contrast. COMPARISON:  Chest x-ray 08/07/2016 FINDINGS: CT CHEST FINDINGS Cardiovascular: Limited evaluation without intravenous contrast. Atherosclerotic calcifications. Non aneurysmal aorta. Coronary artery calcifications. The heart is nonenlarged. Trace pericardial effusion. Mediastinum/Nodes: Midline trachea. Thyroid grossly normal. The esophagus is  within normal limits. Lungs/Pleura: Mild to moderate emphysema. Small right-sided pleural effusion with suspected infiltrate in the right lower lobe. No pneumothorax. Musculoskeletal: No acute or suspicious bone lesion. CT ABDOMEN PELVIS FINDINGS Hepatobiliary: No focal hepatic abnormality. Calcified gallstones. No biliary dilatation Pancreas: Unremarkable. No pancreatic ductal dilatation or surrounding inflammatory changes. Spleen: Normal in size without focal abnormality. Adrenals/Urinary Tract: Adrenal glands are within normal limits. No hydronephrosis. Two adjacent stones in the mid left kidney, both measuring 6 mm. The bladder is unremarkable. Stomach/Bowel: Stomach is within normal limits. Appendix appears normal. No evidence of bowel wall thickening, distention, or inflammatory changes. Numerous colon diverticula without acute inflammation. Vascular/Lymphatic: Aortic atherosclerosis. No enlarged abdominal or pelvic lymph nodes. Reproductive: Uterus and bilateral adnexa are unremarkable. Other: Small amount of free fluid within the pelvis. Musculoskeletal: No acute or suspicious bone lesion. Degenerative changes. Mild diffuse subcutaneous edema. IMPRESSION: 1. Small right-sided pleural effusion with suspected right lower lobe pneumonia. 2. Gallstones 3. Nonobstructing stones in the left kidney 4. Colon diverticular disease without acute inflammation 5. Small amount of free fluid in the pelvis. Mild diffuse subcutaneous edema. Electronically Signed   By: Donavan Foil M.D.   On: 08/07/2016 21:11     EKG: Independently reviewed. Sinus rhythm, no ischemic change.  Assessment/Plan Principal Problem:   Anasarca Active Problems:   Tobacco abuse   Essential hypertension   AKI (acute kidney injury) ([place])  Dyspnea   Anasarca: Etiology is not clear. Patient's transaminases are normal, less likely to have liver failure. BNP 42, making congestive heart failure less likely the etiology though cannot  completely rule out this possibility since patient has obesity, which may cause falsely low BNP. Potential differential diagnosis is nephrotic syndrome given patient has acute renal injury and low albumin 1.9.   - will admit to tele bed - check UA, FLP - check UDS and HIV ab - get 2d echo - will hold diuretics, and f/u renal Fx in morning to decide if to continue direuretic - give 100 g of albumin now  AKI (acute kidney injury) ([place]): likely due to nephrotic syndrome as discussed above. -f/u Urinalysis -get renal US -check FeUrea -please consult renal in AM  HTN: -Hold HCTZ and lasix now. -start amlodipine 10 mg daily -IV hydralazine when necessary  Tobacco abuse: -Did counseling about importance of quitting smoking -Nicotine patch  Dyspnea: Etiology is not clear. Likely due to anasarca. Chest x-ray and CT of chest that showed possible right lower lobe infiltration, but patient does not have fever or leukocytosis. Clinically does not seem to have typical pneumonia.  -pt received one dose of Rocephin and azithromycin by IV in ED, will discontinue Rocephin, and continue oral azithromycin. -Sputum culture -DuoNebs, prn albuterol nebs -When necessary Mucinex for cough  DVT ppx: SQ Lovenox Code Status: Full code Family Communication: None at bed side.   Disposition Plan:  Anticipate discharge back to previous home environment Consults called:  none Admission status:  Inpatient/tele   Date of Service 08/08/2016    Ivor Costa [facility] Pager [phone]  If 7PM-7AM, please contact night-coverage [email or web address] Password [email or web address] 08/08/2016, 1:55 AM

## 2016-08-09 ENCOUNTER — Inpatient Hospital Stay (HOSPITAL_COMMUNITY): Payer: Medicare Other

## 2016-08-09 DIAGNOSIS — N049 Nephrotic syndrome with unspecified morphologic changes: Secondary | ICD-10-CM

## 2016-08-09 DIAGNOSIS — R0600 Dyspnea, unspecified: Secondary | ICD-10-CM

## 2016-08-09 LAB — COMPREHENSIVE METABOLIC PANEL
ALT: 10 U/L — ABNORMAL LOW (ref 14–54)
ANION GAP: 6 (ref 5–15)
AST: 16 U/L (ref 15–41)
Albumin: 2.3 g/dL — ABNORMAL LOW (ref 3.5–5.0)
Alkaline Phosphatase: 58 U/L (ref 38–126)
BUN: 43 mg/dL — ABNORMAL HIGH (ref 6–20)
CHLORIDE: 107 mmol/L (ref 101–111)
CO2: 25 mmol/L (ref 22–32)
Calcium: 7.6 mg/dL — ABNORMAL LOW (ref 8.9–10.3)
Creatinine, Ser: 1.68 mg/dL — ABNORMAL HIGH (ref 0.44–1.00)
GFR, EST AFRICAN AMERICAN: 34 mL/min — AB (ref >60)
GFR, EST NON AFRICAN AMERICAN: 30 mL/min — AB (ref >60)
Glucose, Bld: 104 mg/dL — ABNORMAL HIGH (ref 65–99)
POTASSIUM: 3.7 mmol/L (ref 3.5–5.1)
Sodium: 138 mmol/L (ref 135–145)
TOTAL PROTEIN: 4.6 g/dL — AB (ref 6.5–8.1)
Total Bilirubin: 0.1 mg/dL — ABNORMAL LOW (ref 0.3–1.2)

## 2016-08-09 LAB — CBC
HCT: 31.1 % — ABNORMAL LOW (ref 36.0–46.0)
Hemoglobin: 10.4 g/dL — ABNORMAL LOW (ref 12.0–15.0)
MCH: 31.9 pg (ref 26.0–34.0)
MCHC: 33.4 g/dL (ref 30.0–36.0)
MCV: 95.4 fL (ref 78.0–100.0)
PLATELETS: 225 10*3/uL (ref 150–400)
RBC: 3.26 MIL/uL — AB (ref 3.87–5.11)
RDW: 13.5 % (ref 11.5–15.5)
WBC: 6.4 10*3/uL (ref 4.0–10.5)

## 2016-08-09 MED ORDER — FUROSEMIDE 10 MG/ML IJ SOLN
160.0000 mg | Freq: Four times a day (QID) | INTRAVENOUS | Status: DC
  Administered 2016-08-09 – 2016-08-11 (×9): 160 mg via INTRAVENOUS
  Filled 2016-08-09 (×10): qty 16

## 2016-08-09 NOTE — Progress Notes (Signed)
[facility] PROGRESS NOTE  [name] YQM:[id number] DOB: Mar 25, 1946 DOA: 08/07/2016  PCP: [facility]  Brief History/Interval Summary: 71 year old African-American female with a past medical history of hypertension who presented with complaints of shortness of breath and leg swelling. Her symptoms started about a week ago with leg swelling. She was given diuretics by her primary care provider with no improvement. She subsequently decided to come into the hospital. She was found to have elevated creatinine. She was hospitalized for further management.  Reason for Visit: Nephrotic syndrome  Consultants: Nephrology. Interventional radiology.  Procedures:   Transthoracic echocardiogram Study Conclusions  - Left ventricle: The cavity size was normal. Systolic function was   vigorous. The estimated ejection fraction was in the range of 65%   to 70%. Wall motion was normal; there were no regional wall   motion abnormalities. Doppler parameters are consistent with   abnormal left ventricular relaxation (grade 1 diastolic   dysfunction). There was no evidence of elevated ventricular   filling pressure by Doppler parameters. - Aortic valve: There was no regurgitation. - Aortic root: The aortic root was normal in size. - Mitral valve: There was mild regurgitation. - Right ventricle: Systolic function was normal. - Right atrium: The atrium was normal in size. - Tricuspid valve: There was mild regurgitation. - Pulmonic valve: There was no regurgitation. - Pulmonary arteries: Systolic pressure was within the normal   range. - Inferior vena cava: The vessel was dilated. The respirophasic   diameter changes were blunted (< 50%), consistent with elevated   central venous pressure. - Pericardium, extracardiac: There was no pericardial effusion.  Impressions: - Consider non-cardiac causes for anasarca and dyspnea.  Renal biopsy is planned for  3/26  Antibiotics: None  Subjective/Interval History: Patient complains of shortness of breath this morning. She has been coughing up clear sputum. Denies any chest pain. Has not noted any improvement in leg swelling.   ROS: Denies any nausea or vomiting.  Objective:  Vital Signs  Vitals:   08/08/16 2017 08/08/16 2119 08/09/16 0455 08/09/16 0809  BP:  135/70 (!) 141/70 (!) 151/77  Pulse:  100 88 84  Resp:  18 18 20   Temp:  99.1 F (37.3 C) 99.8 F (37.7 C) 99.1 F (37.3 C)  TempSrc:  Oral Oral Oral  SpO2: 95% 99% 99% 95%  Weight:   89.2 kg (196 lb 9.6 oz)   Height:        Intake/Output Summary (Last 24 hours) at 08/09/16 1001 Last data filed at 08/09/16 0813  Gross per 24 hour  Intake              600 ml  Output             1370 ml  Net             -770 ml   Filed Weights   08/08/16 0116 08/08/16 0544 08/09/16 0455  Weight: 89.6 kg (197 lb 8 oz) 89.5 kg (197 lb 5 oz) 89.2 kg (196 lb 9.6 oz)    General appearance: alert, cooperative, appears stated age and no distress Resp: Decreased air entry bilaterally. Few crackles at the bases. No wheezing. Coarse breath sounds. Cardio: regular rate and rhythm, S1, S2 normal, no murmur, click, rub or gallop GI: soft, non-tender; bowel sounds normal; no masses,  no organomegaly Extremities: Pitting edema noted bilateral lower extremities. No improvement compared to yesterday. Neurologic: No focal deficits.  Lab Results:  Data Reviewed: I have personally reviewed  following labs and imaging studies  CBC:  Recent Labs Lab 08/07/16 1850 08/08/16 0159 08/09/16 0132  WBC 7.0 6.5 6.4  NEUTROABS  --  4.2  --   HGB 13.9 13.0 10.4*  HCT 40.8 39.0 31.1*  MCV 94.2 94.9 95.4  PLT 298 292 086    Basic Metabolic Panel:  Recent Labs Lab 08/07/16 1850 08/08/16 0159 08/09/16 0132  NA 136 136 138  K 4.1 3.8 3.7  CL 107 102 107  CO2 23 25 25   GLUCOSE 106* 129* 104*  BUN 47* 45* 43*  CREATININE 1.90* 1.83* 1.68*  CALCIUM  7.7* 7.6* 7.6*    GFR: Estimated Creatinine Clearance: 35.1 mL/min (A) (by C-G formula based on SCr of 1.68 mg/dL (H)).  Liver Function Tests:  Recent Labs Lab 08/07/16 1850 08/09/16 0132  AST 25 16  ALT 15 10*  ALKPHOS 84 58  BILITOT 0.5 0.1*  PROT 5.4* 4.6*  ALBUMIN 1.9* 2.3*    Coagulation Profile:  Recent Labs Lab 08/08/16 0159  INR 1.07    Cardiac Enzymes:  Recent Labs Lab 08/07/16 1850  TROPONINI <0.03    CBG:  Recent Labs Lab 08/08/16 0623  GLUCAP 98    Lipid Profile:  Recent Labs  08/08/16 0159  CHOL 281*  HDL 57  LDLCALC 185*  TRIG 196*  CHOLHDL 4.9     Recent Results (from the past 240 hour(s))  Culture, expectorated sputum-assessment     Status: None   Collection Time: 08/08/16  4:02 AM  Result Value Ref Range Status   Specimen Description EXPECTORATED SPUTUM  Final   Special Requests NONE  Final   Sputum evaluation THIS SPECIMEN IS ACCEPTABLE FOR SPUTUM CULTURE  Final   Report Status 08/08/2016 FINAL  Final  Culture, respiratory (NON-Expectorated)     Status: None (Preliminary result)   Collection Time: 08/08/16  4:02 AM  Result Value Ref Range Status   Specimen Description EXPECTORATED SPUTUM  Final   Special Requests NONE Reflexed from [id number]  Final   Gram Stain   Final    FEW SQUAMOUS EPITHELIAL CELLS PRESENT FEW WBC PRESENT, PREDOMINANTLY MONONUCLEAR RARE GRAM POSITIVE COCCI IN PAIRS    Culture PENDING  Incomplete   Report Status PENDING  Incomplete      Radiology Studies: Ct Abdomen Pelvis Wo Contrast  Result Date: 08/07/2016 CLINICAL DATA:  Swelling in the bilateral lower extremities with chest congestion EXAM: CT CHEST, ABDOMEN AND PELVIS WITHOUT CONTRAST TECHNIQUE: Multidetector CT imaging of the chest, abdomen and pelvis was performed following the standard protocol without IV contrast. COMPARISON:  Chest x-ray 08/07/2016 FINDINGS: CT CHEST FINDINGS Cardiovascular: Limited evaluation without intravenous contrast.  Atherosclerotic calcifications. Non aneurysmal aorta. Coronary artery calcifications. The heart is nonenlarged. Trace pericardial effusion. Mediastinum/Nodes: Midline trachea. Thyroid grossly normal. The esophagus is within normal limits. Lungs/Pleura: Mild to moderate emphysema. Small right-sided pleural effusion with suspected infiltrate in the right lower lobe. No pneumothorax. Musculoskeletal: No acute or suspicious bone lesion. CT ABDOMEN PELVIS FINDINGS Hepatobiliary: No focal hepatic abnormality. Calcified gallstones. No biliary dilatation Pancreas: Unremarkable. No pancreatic ductal dilatation or surrounding inflammatory changes. Spleen: Normal in size without focal abnormality. Adrenals/Urinary Tract: Adrenal glands are within normal limits. No hydronephrosis. Two adjacent stones in the mid left kidney, both measuring 6 mm. The bladder is unremarkable. Stomach/Bowel: Stomach is within normal limits. Appendix appears normal. No evidence of bowel wall thickening, distention, or inflammatory changes. Numerous colon diverticula without acute inflammation. Vascular/Lymphatic: Aortic atherosclerosis. No enlarged abdominal or pelvic  lymph nodes. Reproductive: Uterus and bilateral adnexa are unremarkable. Other: Small amount of free fluid within the pelvis. Musculoskeletal: No acute or suspicious bone lesion. Degenerative changes. Mild diffuse subcutaneous edema. IMPRESSION: 1. Small right-sided pleural effusion with suspected right lower lobe pneumonia. 2. Gallstones 3. Nonobstructing stones in the left kidney 4. Colon diverticular disease without acute inflammation 5. Small amount of free fluid in the pelvis. Mild diffuse subcutaneous edema. Electronically Signed   By: Donavan Foil M.D.   On: 08/07/2016 21:11   Dg Chest 2 View  Result Date: 08/07/2016 CLINICAL DATA:  Acute onset of shortness of breath, cough and chest congestion. Nausea. Bilateral lower extremity swelling. Initial encounter. EXAM: CHEST  2  VIEW COMPARISON:  None. FINDINGS: The lungs are well-aerated. A small right pleural effusion is noted. Mild bibasilar opacities could reflect minimal interstitial edema. No pneumothorax is seen. The heart is normal in size; the mediastinal contour is within normal limits. No acute osseous abnormalities are seen. IMPRESSION: Small right pleural effusion. Mild bibasilar opacities could reflect minimal interstitial edema. Electronically Signed   By: Garald Balding M.D.   On: 08/07/2016 18:50   Ct Chest Wo Contrast  Result Date: 08/07/2016 CLINICAL DATA:  Swelling in the bilateral lower extremities with chest congestion EXAM: CT CHEST, ABDOMEN AND PELVIS WITHOUT CONTRAST TECHNIQUE: Multidetector CT imaging of the chest, abdomen and pelvis was performed following the standard protocol without IV contrast. COMPARISON:  Chest x-ray 08/07/2016 FINDINGS: CT CHEST FINDINGS Cardiovascular: Limited evaluation without intravenous contrast. Atherosclerotic calcifications. Non aneurysmal aorta. Coronary artery calcifications. The heart is nonenlarged. Trace pericardial effusion. Mediastinum/Nodes: Midline trachea. Thyroid grossly normal. The esophagus is within normal limits. Lungs/Pleura: Mild to moderate emphysema. Small right-sided pleural effusion with suspected infiltrate in the right lower lobe. No pneumothorax. Musculoskeletal: No acute or suspicious bone lesion. CT ABDOMEN PELVIS FINDINGS Hepatobiliary: No focal hepatic abnormality. Calcified gallstones. No biliary dilatation Pancreas: Unremarkable. No pancreatic ductal dilatation or surrounding inflammatory changes. Spleen: Normal in size without focal abnormality. Adrenals/Urinary Tract: Adrenal glands are within normal limits. No hydronephrosis. Two adjacent stones in the mid left kidney, both measuring 6 mm. The bladder is unremarkable. Stomach/Bowel: Stomach is within normal limits. Appendix appears normal. No evidence of bowel wall thickening, distention, or  inflammatory changes. Numerous colon diverticula without acute inflammation. Vascular/Lymphatic: Aortic atherosclerosis. No enlarged abdominal or pelvic lymph nodes. Reproductive: Uterus and bilateral adnexa are unremarkable. Other: Small amount of free fluid within the pelvis. Musculoskeletal: No acute or suspicious bone lesion. Degenerative changes. Mild diffuse subcutaneous edema. IMPRESSION: 1. Small right-sided pleural effusion with suspected right lower lobe pneumonia. 2. Gallstones 3. Nonobstructing stones in the left kidney 4. Colon diverticular disease without acute inflammation 5. Small amount of free fluid in the pelvis. Mild diffuse subcutaneous edema. Electronically Signed   By: Donavan Foil M.D.   On: 08/07/2016 21:11   US Renal  Result Date: 08/08/2016 CLINICAL DATA:  Acute renal injury. EXAM: RENAL / URINARY TRACT ULTRASOUND COMPLETE COMPARISON:  CT abdomen dated 08/07/2016. FINDINGS: Right Kidney: Length: 12.4 cm. Echogenicity within normal limits. No mass or hydronephrosis visualized. Left Kidney: Length: 12.4 cm. Echogenicity within normal limits. No mass or hydronephrosis visualized. 4 mm left renal stone, compatible with 1 of the 2 similar size stones seen within the left renal pelvis on a recent CT. Bladder: Bladder is decompressed. IMPRESSION: 1. Left nephrolithiasis. Left kidney is otherwise unremarkable. No hydronephrosis. 2. Right kidney appears normal. 3. Bladder is decompressed. Electronically Signed   By: Cherlynn Kaiser  Enriqueta Shutter M.D.   On: 08/08/2016 12:23     Medications:  Scheduled: . albumin human  100 g Intravenous Once  . amLODipine  10 mg Oral Daily  . atorvastatin  20 mg Oral q1800  . azithromycin  250 mg Oral Daily  . enoxaparin (LOVENOX) injection  40 mg Subcutaneous Q24H  . furosemide  160 mg Intravenous Q6H  . ipratropium-albuterol  3 mL Nebulization QID  . nicotine  21 mg Transdermal Daily  . sodium chloride flush  3 mL Intravenous Q12H    Continuous:  IWO:EHOZYYQMGNOIB **OR** acetaminophen, albuterol, hydrALAZINE, ondansetron **OR** ondansetron (ZOFRAN) IV, zolpidem  Assessment/Plan:  Principal Problem:   Anasarca Active Problems:   Tobacco abuse   Essential hypertension   AKI (acute kidney injury) ([place])   Dyspnea    Anasarca Secondary to nephrotic syndrome Patient's echocardiogram shows normal systolic function. LFTs are normal. No history of liver disease. No history of alcohol intake. UA does show significant proteinuria. Patient started on high dose Lasix by nephrology. Continue to monitor ins and outs. Daily weights. Has had some diuresis, but not significantly so. Lasix dose has been increased.   AKI (acute kidney injury)/nephrotic syndrome No previously known history of kidney disease. She does have a history of hypertension but is on only one medication at home. Is followed regularly by her primary care provider. Nephrology has been consulted. Appreciate their input. Plan is for renal biopsy on Monday. Lasix as discussed above. Creatinine shows improvement. SPEP has been ordered.  Dyspnea Dyspnea is likely due to fluid overload. Chest x-ray, repeat this morning does not show any pulmonary edema or infiltrates. Use oxygen as needed. Lasix as discussed above. She was given ceftriaxone and azithromycin in the emergency Department. WBC is normal. She is noted to have low-grade fever. Could have some form of upper respiratory tract infection. Currently on oral azithromycin.  Essential hypertension  Started on amlodipine since her diuretics are being held. Blood pressure not yet optimally controlled. Continue current dose of amlodipine for now.  Tobacco abuse Patient was counseled. Nicotine patch.    DVT Prophylaxis: Lovenox    Code Status: Full code  Family Communication: Discussed with the patient Disposition Plan: Management as outlined above. Await improvement in lower extremity edema and further  evaluation of nephrotic syndrome.    LOS: 2 days   [facility] Pager [phone] 08/09/2016, 10:01 AM  If 7PM-7AM, please contact night-coverage at [email or web address], password [email or web address]

## 2016-08-09 NOTE — Progress Notes (Signed)
Subjective:  Had 1300 of urine- crt down some but still quite edematous Objective Vital signs in last 24 hours: Vitals:   08/08/16 2017 08/08/16 2119 08/09/16 0455 08/09/16 0809  BP:  135/70 (!) 141/70 (!) 151/77  Pulse:  100 88 84  Resp:  18 18 20   Temp:  99.1 F (37.3 C) 99.8 F (37.7 C) 99.1 F (37.3 C)  TempSrc:  Oral Oral Oral  SpO2: 95% 99% 99% 95%  Weight:   89.2 kg (196 lb 9.6 oz)   Height:       Weight change: 5.262 kg (11 lb 9.6 oz)  Intake/Output Summary (Last 24 hours) at 08/09/16 0843 Last data filed at 08/09/16 0813  Gross per 24 hour  Intake              360 ml  Output             1470 ml  Net            -1110 ml    Assessment/Plan: 71 year old black female with very little past medical history presenting with what appears to be nephrotic syndrome with some acute kidney injury 1.Renal- normal creatinine in August 2017, creatinine of 1.3 last week, now creatinine of 1.9-->1.6. Most likely result of this intrarenal process. Also with proteinuria which appears to be nephrotic associated with hypoalbuminemia. Patient will need a percutaneous kidney biopsy to make a diagnosis of the etiology of this.  Will also do a little bit of a serologic workup including hepatitis, HIV (was neg), ANA.  Also in the differential would be membranous GN which can sometimes be associated with malignancy so I did discuss that with the patient that she needs to have age appropriate cancer screening- she said she was behind on her mammogram, actually has a colonoscopy scheduled for next month but has not had a Pap smear in quite some time.  Chest x-ray did not show any obvious abnormalities 2. Hypertension/volume  - volume overloaded in the setting of nephrotic syndrome. We'll give high-dose IV Lasix- had some response but not that robust, will inc IV lasix to 160 q 6 3. Anemia  - not an issue at this time- looks like has dropped- will follow    Canon: Basic  Metabolic Panel:  Recent Labs Lab 08/07/16 1850 08/08/16 0159 08/09/16 0132  NA 136 136 138  K 4.1 3.8 3.7  CL 107 102 107  CO2 23 25 25   GLUCOSE 106* 129* 104*  BUN 47* 45* 43*  CREATININE 1.90* 1.83* 1.68*  CALCIUM 7.7* 7.6* 7.6*   Liver Function Tests:  Recent Labs Lab 08/07/16 1850 08/09/16 0132  AST 25 16  ALT 15 10*  ALKPHOS 84 58  BILITOT 0.5 0.1*  PROT 5.4* 4.6*  ALBUMIN 1.9* 2.3*   No results for input(s): LIPASE, AMYLASE in the last 168 hours. No results for input(s): AMMONIA in the last 168 hours. CBC:  Recent Labs Lab 08/07/16 1850 08/08/16 0159 08/09/16 0132  WBC 7.0 6.5 6.4  NEUTROABS  --  4.2  --   HGB 13.9 13.0 10.4*  HCT 40.8 39.0 31.1*  MCV 94.2 94.9 95.4  PLT 298 292 225   Cardiac Enzymes:  Recent Labs Lab 08/07/16 1850  TROPONINI <0.03   CBG:  Recent Labs Lab 08/08/16 0623  GLUCAP 98    Iron Studies: No results for input(s): IRON, TIBC, TRANSFERRIN, FERRITIN in the last 72 hours. Studies/Results: Ct Abdomen Pelvis Wo Contrast  Result Date: 08/07/2016 CLINICAL DATA:  Swelling in the bilateral lower extremities with chest congestion EXAM: CT CHEST, ABDOMEN AND PELVIS WITHOUT CONTRAST TECHNIQUE: Multidetector CT imaging of the chest, abdomen and pelvis was performed following the standard protocol without IV contrast. COMPARISON:  Chest x-ray 08/07/2016 FINDINGS: CT CHEST FINDINGS Cardiovascular: Limited evaluation without intravenous contrast. Atherosclerotic calcifications. Non aneurysmal aorta. Coronary artery calcifications. The heart is nonenlarged. Trace pericardial effusion. Mediastinum/Nodes: Midline trachea. Thyroid grossly normal. The esophagus is within normal limits. Lungs/Pleura: Mild to moderate emphysema. Small right-sided pleural effusion with suspected infiltrate in the right lower lobe. No pneumothorax. Musculoskeletal: No acute or suspicious bone lesion. CT ABDOMEN PELVIS FINDINGS Hepatobiliary: No focal hepatic  abnormality. Calcified gallstones. No biliary dilatation Pancreas: Unremarkable. No pancreatic ductal dilatation or surrounding inflammatory changes. Spleen: Normal in size without focal abnormality. Adrenals/Urinary Tract: Adrenal glands are within normal limits. No hydronephrosis. Two adjacent stones in the mid left kidney, both measuring 6 mm. The bladder is unremarkable. Stomach/Bowel: Stomach is within normal limits. Appendix appears normal. No evidence of bowel wall thickening, distention, or inflammatory changes. Numerous colon diverticula without acute inflammation. Vascular/Lymphatic: Aortic atherosclerosis. No enlarged abdominal or pelvic lymph nodes. Reproductive: Uterus and bilateral adnexa are unremarkable. Other: Small amount of free fluid within the pelvis. Musculoskeletal: No acute or suspicious bone lesion. Degenerative changes. Mild diffuse subcutaneous edema. IMPRESSION: 1. Small right-sided pleural effusion with suspected right lower lobe pneumonia. 2. Gallstones 3. Nonobstructing stones in the left kidney 4. Colon diverticular disease without acute inflammation 5. Small amount of free fluid in the pelvis. Mild diffuse subcutaneous edema. Electronically Signed   By: Donavan Foil M.D.   On: 08/07/2016 21:11   Dg Chest 2 View  Result Date: 08/07/2016 CLINICAL DATA:  Acute onset of shortness of breath, cough and chest congestion. Nausea. Bilateral lower extremity swelling. Initial encounter. EXAM: CHEST  2 VIEW COMPARISON:  None. FINDINGS: The lungs are well-aerated. A small right pleural effusion is noted. Mild bibasilar opacities could reflect minimal interstitial edema. No pneumothorax is seen. The heart is normal in size; the mediastinal contour is within normal limits. No acute osseous abnormalities are seen. IMPRESSION: Small right pleural effusion. Mild bibasilar opacities could reflect minimal interstitial edema. Electronically Signed   By: Garald Balding M.D.   On: 08/07/2016 18:50    Ct Chest Wo Contrast  Result Date: 08/07/2016 CLINICAL DATA:  Swelling in the bilateral lower extremities with chest congestion EXAM: CT CHEST, ABDOMEN AND PELVIS WITHOUT CONTRAST TECHNIQUE: Multidetector CT imaging of the chest, abdomen and pelvis was performed following the standard protocol without IV contrast. COMPARISON:  Chest x-ray 08/07/2016 FINDINGS: CT CHEST FINDINGS Cardiovascular: Limited evaluation without intravenous contrast. Atherosclerotic calcifications. Non aneurysmal aorta. Coronary artery calcifications. The heart is nonenlarged. Trace pericardial effusion. Mediastinum/Nodes: Midline trachea. Thyroid grossly normal. The esophagus is within normal limits. Lungs/Pleura: Mild to moderate emphysema. Small right-sided pleural effusion with suspected infiltrate in the right lower lobe. No pneumothorax. Musculoskeletal: No acute or suspicious bone lesion. CT ABDOMEN PELVIS FINDINGS Hepatobiliary: No focal hepatic abnormality. Calcified gallstones. No biliary dilatation Pancreas: Unremarkable. No pancreatic ductal dilatation or surrounding inflammatory changes. Spleen: Normal in size without focal abnormality. Adrenals/Urinary Tract: Adrenal glands are within normal limits. No hydronephrosis. Two adjacent stones in the mid left kidney, both measuring 6 mm. The bladder is unremarkable. Stomach/Bowel: Stomach is within normal limits. Appendix appears normal. No evidence of bowel wall thickening, distention, or inflammatory changes. Numerous colon diverticula without acute inflammation. Vascular/Lymphatic: Aortic atherosclerosis. No enlarged  abdominal or pelvic lymph nodes. Reproductive: Uterus and bilateral adnexa are unremarkable. Other: Small amount of free fluid within the pelvis. Musculoskeletal: No acute or suspicious bone lesion. Degenerative changes. Mild diffuse subcutaneous edema. IMPRESSION: 1. Small right-sided pleural effusion with suspected right lower lobe pneumonia. 2. Gallstones 3.  Nonobstructing stones in the left kidney 4. Colon diverticular disease without acute inflammation 5. Small amount of free fluid in the pelvis. Mild diffuse subcutaneous edema. Electronically Signed   By: Donavan Foil M.D.   On: 08/07/2016 21:11   US Renal  Result Date: 08/08/2016 CLINICAL DATA:  Acute renal injury. EXAM: RENAL / URINARY TRACT ULTRASOUND COMPLETE COMPARISON:  CT abdomen dated 08/07/2016. FINDINGS: Right Kidney: Length: 12.4 cm. Echogenicity within normal limits. No mass or hydronephrosis visualized. Left Kidney: Length: 12.4 cm. Echogenicity within normal limits. No mass or hydronephrosis visualized. 4 mm left renal stone, compatible with 1 of the 2 similar size stones seen within the left renal pelvis on a recent CT. Bladder: Bladder is decompressed. IMPRESSION: 1. Left nephrolithiasis. Left kidney is otherwise unremarkable. No hydronephrosis. 2. Right kidney appears normal. 3. Bladder is decompressed. Electronically Signed   By: Franki Cabot M.D.   On: 08/08/2016 12:23   Medications: Infusions:   Scheduled Medications: . albumin human  100 g Intravenous Once  . amLODipine  10 mg Oral Daily  . atorvastatin  20 mg Oral q1800  . azithromycin  250 mg Oral Daily  . enoxaparin (LOVENOX) injection  40 mg Subcutaneous Q24H  . furosemide  160 mg Intravenous Q12H  . ipratropium-albuterol  3 mL Nebulization QID  . nicotine  21 mg Transdermal Daily  . sodium chloride flush  3 mL Intravenous Q12H    have reviewed scheduled and prn medications.  Physical Exam: General: NAD- still c/o congestion Heart: RRR Lungs: dec BS at bases Abdomen: soft, non tender Extremities: 2-3+ pitting edema    08/09/2016,8:43 AM  LOS: 2 days

## 2016-08-09 NOTE — Plan of Care (Signed)
Problem: Fluid Volume: Goal: Ability to maintain a balanced intake and output will improve Outcome: Progressing Patient continues on IV Lasix and producing moderate amount of urine. LE edema decreasing. Will continue to monitor.

## 2016-08-09 NOTE — Progress Notes (Signed)
Chief Complaint: Patient was seen in consultation today for request for renal biopsy at the request of Dr. Corliss Parish  Referring Physician(s):  Dr. Corliss Parish  Supervising Physician: Markus Daft  Patient Status: [facility] - In-pt  History of Present Illness: Mary Chapman is a 71 y.o. female who was admitted with SOB, edema, and renal insufficiency. Her workup is concerning for nephrotic syndrome. Nephrology has been consulted and requests random renal biopsy. Chart, PMHx, meds, labs, imaging reviewed. She is feeling some better since admission. No family present at the time of this consult, but pt states daughter is well aware of plan.  Past Medical History:  Diagnosis Date  . Hypertension   . Tobacco abuse     Past Surgical History:  Procedure Laterality Date  . TUBAL LIGATION      Allergies: Patient has no known allergies.  Medications:  Current Facility-Administered Medications:  .  acetaminophen (TYLENOL) tablet 650 mg, 650 mg, Oral, Q6H PRN, 650 mg at 08/08/16 1756 **OR** acetaminophen (TYLENOL) suppository 650 mg, 650 mg, Rectal, Q6H PRN, Ivor Costa, MD .  albumin human 25 % solution 100 g, 100 g, Intravenous, Once, Ivor Costa, MD .  albuterol (PROVENTIL) (2.5 MG/3ML) 0.083% nebulizer solution 2.5 mg, 2.5 mg, Nebulization, Q4H PRN, Ivor Costa, MD .  amLODipine (NORVASC) tablet 10 mg, 10 mg, Oral, Daily, Ivor Costa, MD, 10 mg at 08/09/16 1034 .  atorvastatin (LIPITOR) tablet 20 mg, 20 mg, Oral, q1800, Corliss Parish, MD, 20 mg at 08/08/16 1732 .  azithromycin [facility]) tablet 250 mg, 250 mg, Oral, Daily, Ivor Costa, MD, 250 mg at 08/09/16 1034 .  enoxaparin (LOVENOX) injection 40 mg, 40 mg, Subcutaneous, Q24H, Ivor Costa, MD, 40 mg at 08/09/16 0751 .  furosemide (LASIX) 160 mg in dextrose 5 % 50 mL IVPB, 160 mg, Intravenous, Q6H, Corliss Parish, MD .  hydrALAZINE (APRESOLINE) injection 5 mg, 5 mg, Intravenous, Q2H PRN, Ivor Costa, MD .   ipratropium-albuterol (DUONEB) 0.5-2.5 (3) MG/3ML nebulizer solution 3 mL, 3 mL, Nebulization, QID, Ivor Costa, MD, 3 mL at 08/09/16 0820 .  nicotine (NICODERM CQ - dosed in mg/24 hours) patch 21 mg, 21 mg, Transdermal, Daily, Ivor Costa, MD, 21 mg at 08/09/16 1037 .  ondansetron (ZOFRAN) tablet 4 mg, 4 mg, Oral, Q6H PRN **OR** ondansetron (ZOFRAN) injection 4 mg, 4 mg, Intravenous, Q6H PRN, Ivor Costa, MD .  sodium chloride flush (NS) 0.9 % injection 3 mL, 3 mL, Intravenous, Q12H, Ivor Costa, MD .  zolpidem (AMBIEN) tablet 5 mg, 5 mg, Oral, QHS PRN, Ivor Costa, MD    Family History  Problem Relation Age of Onset  . Hypertension Mother   . Diabetes Mother   . Hypertension Father   . Diabetes Father   . Hypertension Sister   . Hypertension Brother     Social History   Social History  . Marital status: Widowed    Spouse name: N/A  . Number of children: N/A  . Years of education: N/A   Social History Main Topics  . Smoking status: Current Some Day Smoker    Packs/day: 0.50    Types: Cigarettes  . Smokeless tobacco: Never Used  . Alcohol use No  . Drug use: No  . Sexual activity: No   Other Topics Concern  . None   Social History Narrative  . None    Review of Systems: A 12 point ROS discussed and pertinent positives are indicated in the HPI above.  All other systems are  negative.  Review of Systems  Vital Signs: BP (!) 151/77 (BP Location: Left Arm)   Pulse 84   Temp 99.1 F (37.3 C) (Oral)   Resp 20   Ht 5\' 6"  (1.676 m)   Wt 196 lb 9.6 oz (89.2 kg)   SpO2 95%   BMI 31.73 kg/m   Physical Exam  Constitutional: She is oriented to person, place, and time. She appears well-developed and well-nourished. No distress.  HENT:  Head: Normocephalic.  Mouth/Throat: Oropharynx is clear and moist.  Neck: Normal range of motion. No JVD present. No tracheal deviation present.  Cardiovascular: Normal rate, regular rhythm and normal heart sounds.   Pulmonary/Chest: Effort  normal and breath sounds normal. No respiratory distress.  Abdominal: Soft. She exhibits no distension. There is no tenderness.  Musculoskeletal: She exhibits edema.  LE edema bilaterally  Neurological: She is alert and oriented to person, place, and time.  Skin: Skin is warm and dry. She is not diaphoretic.  Psychiatric: She has a normal mood and affect. Judgment normal.    Mallampati Score:  MD Evaluation Airway: WNL Heart: WNL Abdomen: WNL Chest/ Lungs: WNL ASA  Classification: 3 Mallampati/Airway Score: Two  Imaging: Ct Abdomen Pelvis Wo Contrast  Result Date: 08/07/2016 CLINICAL DATA:  Swelling in the bilateral lower extremities with chest congestion EXAM: CT CHEST, ABDOMEN AND PELVIS WITHOUT CONTRAST TECHNIQUE: Multidetector CT imaging of the chest, abdomen and pelvis was performed following the standard protocol without IV contrast. COMPARISON:  Chest x-ray 08/07/2016 FINDINGS: CT CHEST FINDINGS Cardiovascular: Limited evaluation without intravenous contrast. Atherosclerotic calcifications. Non aneurysmal aorta. Coronary artery calcifications. The heart is nonenlarged. Trace pericardial effusion. Mediastinum/Nodes: Midline trachea. Thyroid grossly normal. The esophagus is within normal limits. Lungs/Pleura: Mild to moderate emphysema. Small right-sided pleural effusion with suspected infiltrate in the right lower lobe. No pneumothorax. Musculoskeletal: No acute or suspicious bone lesion. CT ABDOMEN PELVIS FINDINGS Hepatobiliary: No focal hepatic abnormality. Calcified gallstones. No biliary dilatation Pancreas: Unremarkable. No pancreatic ductal dilatation or surrounding inflammatory changes. Spleen: Normal in size without focal abnormality. Adrenals/Urinary Tract: Adrenal glands are within normal limits. No hydronephrosis. Two adjacent stones in the mid left kidney, both measuring 6 mm. The bladder is unremarkable. Stomach/Bowel: Stomach is within normal limits. Appendix appears normal.  No evidence of bowel wall thickening, distention, or inflammatory changes. Numerous colon diverticula without acute inflammation. Vascular/Lymphatic: Aortic atherosclerosis. No enlarged abdominal or pelvic lymph nodes. Reproductive: Uterus and bilateral adnexa are unremarkable. Other: Small amount of free fluid within the pelvis. Musculoskeletal: No acute or suspicious bone lesion. Degenerative changes. Mild diffuse subcutaneous edema. IMPRESSION: 1. Small right-sided pleural effusion with suspected right lower lobe pneumonia. 2. Gallstones 3. Nonobstructing stones in the left kidney 4. Colon diverticular disease without acute inflammation 5. Small amount of free fluid in the pelvis. Mild diffuse subcutaneous edema. Electronically Signed   By: Donavan Foil M.D.   On: 08/07/2016 21:11   Dg Chest 2 View  Result Date: 08/07/2016 CLINICAL DATA:  Acute onset of shortness of breath, cough and chest congestion. Nausea. Bilateral lower extremity swelling. Initial encounter. EXAM: CHEST  2 VIEW COMPARISON:  None. FINDINGS: The lungs are well-aerated. A small right pleural effusion is noted. Mild bibasilar opacities could reflect minimal interstitial edema. No pneumothorax is seen. The heart is normal in size; the mediastinal contour is within normal limits. No acute osseous abnormalities are seen. IMPRESSION: Small right pleural effusion. Mild bibasilar opacities could reflect minimal interstitial edema. Electronically Signed   By: Garald Balding  M.D.   On: 08/07/2016 18:50   Ct Chest Wo Contrast  Result Date: 08/07/2016 CLINICAL DATA:  Swelling in the bilateral lower extremities with chest congestion EXAM: CT CHEST, ABDOMEN AND PELVIS WITHOUT CONTRAST TECHNIQUE: Multidetector CT imaging of the chest, abdomen and pelvis was performed following the standard protocol without IV contrast. COMPARISON:  Chest x-ray 08/07/2016 FINDINGS: CT CHEST FINDINGS Cardiovascular: Limited evaluation without intravenous contrast.  Atherosclerotic calcifications. Non aneurysmal aorta. Coronary artery calcifications. The heart is nonenlarged. Trace pericardial effusion. Mediastinum/Nodes: Midline trachea. Thyroid grossly normal. The esophagus is within normal limits. Lungs/Pleura: Mild to moderate emphysema. Small right-sided pleural effusion with suspected infiltrate in the right lower lobe. No pneumothorax. Musculoskeletal: No acute or suspicious bone lesion. CT ABDOMEN PELVIS FINDINGS Hepatobiliary: No focal hepatic abnormality. Calcified gallstones. No biliary dilatation Pancreas: Unremarkable. No pancreatic ductal dilatation or surrounding inflammatory changes. Spleen: Normal in size without focal abnormality. Adrenals/Urinary Tract: Adrenal glands are within normal limits. No hydronephrosis. Two adjacent stones in the mid left kidney, both measuring 6 mm. The bladder is unremarkable. Stomach/Bowel: Stomach is within normal limits. Appendix appears normal. No evidence of bowel wall thickening, distention, or inflammatory changes. Numerous colon diverticula without acute inflammation. Vascular/Lymphatic: Aortic atherosclerosis. No enlarged abdominal or pelvic lymph nodes. Reproductive: Uterus and bilateral adnexa are unremarkable. Other: Small amount of free fluid within the pelvis. Musculoskeletal: No acute or suspicious bone lesion. Degenerative changes. Mild diffuse subcutaneous edema. IMPRESSION: 1. Small right-sided pleural effusion with suspected right lower lobe pneumonia. 2. Gallstones 3. Nonobstructing stones in the left kidney 4. Colon diverticular disease without acute inflammation 5. Small amount of free fluid in the pelvis. Mild diffuse subcutaneous edema. Electronically Signed   By: Donavan Foil M.D.   On: 08/07/2016 21:11   US Renal  Result Date: 08/08/2016 CLINICAL DATA:  Acute renal injury. EXAM: RENAL / URINARY TRACT ULTRASOUND COMPLETE COMPARISON:  CT abdomen dated 08/07/2016. FINDINGS: Right Kidney: Length: 12.4 cm.  Echogenicity within normal limits. No mass or hydronephrosis visualized. Left Kidney: Length: 12.4 cm. Echogenicity within normal limits. No mass or hydronephrosis visualized. 4 mm left renal stone, compatible with 1 of the 2 similar size stones seen within the left renal pelvis on a recent CT. Bladder: Bladder is decompressed. IMPRESSION: 1. Left nephrolithiasis. Left kidney is otherwise unremarkable. No hydronephrosis. 2. Right kidney appears normal. 3. Bladder is decompressed. Electronically Signed   By: Franki Cabot M.D.   On: 08/08/2016 12:23   Dg Chest Port 1 View  Result Date: 08/09/2016 CLINICAL DATA:  Shortness of breath and cough for 1 week EXAM: PORTABLE CHEST 1 VIEW COMPARISON:  August 07, 2016 chest radiograph and chest CT FINDINGS: There is no edema or consolidation. The heart size and pulmonary vascularity are normal. No adenopathy. No bone lesions. IMPRESSION: No edema or consolidation. Electronically Signed   By: [name] III M.D.   On: 08/09/2016 10:30    Labs:  CBC:  Recent Labs  08/07/16 1850 08/08/16 0159 08/09/16 0132  WBC 7.0 6.5 6.4  HGB 13.9 13.0 10.4*  HCT 40.8 39.0 31.1*  PLT 298 292 225    COAGS:  Recent Labs  08/08/16 0159  INR 1.07    BMP:  Recent Labs  08/07/16 1850 08/08/16 0159 08/09/16 0132  NA 136 136 138  K 4.1 3.8 3.7  CL 107 102 107  CO2 23 25 25   GLUCOSE 106* 129* 104*  BUN 47* 45* 43*  CALCIUM 7.7* 7.6* 7.6*  CREATININE 1.90* 1.83* 1.68*  GFRNONAA 26* 27* 30*  GFRAA 30* 31* 34*    LIVER FUNCTION TESTS:  Recent Labs  08/07/16 1850 08/09/16 0132  BILITOT 0.5 0.1*  AST 25 16  ALT 15 10*  ALKPHOS 84 58  PROT 5.4* 4.6*  ALBUMIN 1.9* 2.3*    TUMOR MARKERS: No results for input(s): AFPTM, CEA, CA199, CHROMGRNA in the last 8760 hours.  Assessment and Plan: AKI, possible nephrotic syndrome Plan for US guided random renal biopsy on Mon 3/26 Labs reviewed, ok SBP slightly high, hopefully will improve by Mon  am. Hold 3/26 Lovenox, NPO p MN for Mon am Risks and Benefits discussed with the patient including, but not limited to bleeding, infection, damage to adjacent structures or low yield requiring additional tests. All of the patient's questions were answered, patient is agreeable to proceed. Consent signed and in chart.     Thank you for this interesting consult.  I greatly enjoyed meeting Mary Chapman and look forward to participating in their care.  A copy of this report was sent to the requesting provider on this date.  Electronically Signed: Ascencion Dike 08/09/2016, 10:36 AM   I spent a total of 20 minutes in face to face in clinical consultation, greater than 50% of which was counseling/coordinating care for renal biopsy

## 2016-08-10 LAB — IRON AND TIBC
Iron: 29 ug/dL (ref 28–170)
SATURATION RATIOS: 25 % (ref 10.4–31.8)
TIBC: 115 ug/dL — ABNORMAL LOW (ref 250–450)
UIBC: 86 ug/dL

## 2016-08-10 LAB — CULTURE, RESPIRATORY W GRAM STAIN: Culture: NORMAL

## 2016-08-10 LAB — HEPATITIS PANEL, ACUTE
HEP A IGM: NEGATIVE
HEP B C IGM: NEGATIVE
HEP B S AG: NEGATIVE

## 2016-08-10 LAB — RETICULOCYTES
RBC.: 3.81 MIL/uL — AB (ref 3.87–5.11)
Retic Count, Absolute: 30.5 10*3/uL (ref 19.0–186.0)
Retic Ct Pct: 0.8 % (ref 0.4–3.1)

## 2016-08-10 LAB — RENAL FUNCTION PANEL
Albumin: 2.1 g/dL — ABNORMAL LOW (ref 3.5–5.0)
Anion gap: 7 (ref 5–15)
BUN: 34 mg/dL — AB (ref 6–20)
CALCIUM: 7.8 mg/dL — AB (ref 8.9–10.3)
CO2: 28 mmol/L (ref 22–32)
CREATININE: 1.22 mg/dL — AB (ref 0.44–1.00)
Chloride: 106 mmol/L (ref 101–111)
GFR calc Af Amer: 51 mL/min — ABNORMAL LOW (ref >60)
GFR calc non Af Amer: 44 mL/min — ABNORMAL LOW (ref >60)
GLUCOSE: 100 mg/dL — AB (ref 65–99)
Phosphorus: 5 mg/dL — ABNORMAL HIGH (ref 2.5–4.6)
Potassium: 3.3 mmol/L — ABNORMAL LOW (ref 3.5–5.1)
Sodium: 141 mmol/L (ref 135–145)

## 2016-08-10 LAB — CBC
HCT: 36.4 % (ref 36.0–46.0)
Hemoglobin: 12.1 g/dL (ref 12.0–15.0)
MCH: 31.8 pg (ref 26.0–34.0)
MCHC: 33.2 g/dL (ref 30.0–36.0)
MCV: 95.5 fL (ref 78.0–100.0)
PLATELETS: 281 10*3/uL (ref 150–400)
RBC: 3.81 MIL/uL — ABNORMAL LOW (ref 3.87–5.11)
RDW: 13.5 % (ref 11.5–15.5)
WBC: 5.5 10*3/uL (ref 4.0–10.5)

## 2016-08-10 LAB — FERRITIN: FERRITIN: 206 ng/mL (ref 11–307)

## 2016-08-10 LAB — FOLATE: Folate: 6.5 ng/mL (ref >5.9)

## 2016-08-10 LAB — VITAMIN B12: VITAMIN B 12: 272 pg/mL (ref 180–914)

## 2016-08-10 MED ORDER — METOPROLOL TARTRATE 25 MG PO TABS
25.0000 mg | ORAL_TABLET | Freq: Two times a day (BID) | ORAL | Status: DC
  Administered 2016-08-10 – 2016-08-20 (×19): 25 mg via ORAL
  Filled 2016-08-10 (×19): qty 1

## 2016-08-10 MED ORDER — POTASSIUM CHLORIDE CRYS ER 20 MEQ PO TBCR
40.0000 meq | EXTENDED_RELEASE_TABLET | Freq: Once | ORAL | Status: AC
  Administered 2016-08-10: 40 meq via ORAL
  Filled 2016-08-10: qty 2

## 2016-08-10 NOTE — Progress Notes (Signed)
[facility] PROGRESS NOTE  Mary Chapman QMV:[id number] DOB: Aug 19, 1945 DOA: 08/07/2016  PCP: [facility]  Brief History/Interval Summary: 71 year old African-American female with a past medical history of hypertension who presented with complaints of shortness of breath and leg swelling. Her symptoms started about a week ago with leg swelling. She was given diuretics by her primary care provider with no improvement. She subsequently decided to come into the hospital. She was found to have elevated creatinine. She was hospitalized for further management.  Reason for Visit: Nephrotic syndrome  Consultants: Nephrology. Interventional radiology.  Procedures:   Transthoracic echocardiogram Study Conclusions  - Left ventricle: The cavity size was normal. Systolic function was   vigorous. The estimated ejection fraction was in the range of 65%   to 70%. Wall motion was normal; there were no regional wall   motion abnormalities. Doppler parameters are consistent with   abnormal left ventricular relaxation (grade 1 diastolic   dysfunction). There was no evidence of elevated ventricular   filling pressure by Doppler parameters. - Aortic valve: There was no regurgitation. - Aortic root: The aortic root was normal in size. - Mitral valve: There was mild regurgitation. - Right ventricle: Systolic function was normal. - Right atrium: The atrium was normal in size. - Tricuspid valve: There was mild regurgitation. - Pulmonic valve: There was no regurgitation. - Pulmonary arteries: Systolic pressure was within the normal   range. - Inferior vena cava: The vessel was dilated. The respirophasic   diameter changes were blunted (< 50%), consistent with elevated   central venous pressure. - Pericardium, extracardiac: There was no pericardial effusion.  Impressions: - Consider non-cardiac causes for anasarca and dyspnea.  Renal biopsy is planned for  3/26  Antibiotics: None  Subjective/Interval History: Patient states that she is feeling much better this morning. Breathing has improved. She feels that that leg swelling is improving as well. She is making more urine.   ROS: Denies any nausea or vomiting.  Objective:  Vital Signs  Vitals:   08/09/16 1916 08/09/16 1959 08/10/16 0438 08/10/16 0544  BP: (!) 148/78   (!) 159/86  Pulse: 92   90  Resp: 18   16  Temp: 98.4 F (36.9 C)   98.7 F (37.1 C)  TempSrc: Oral   Oral  SpO2: 97% 99%  97%  Weight:   86.9 kg (191 lb 9.6 oz)   Height:        Intake/Output Summary (Last 24 hours) at 08/10/16 0853 Last data filed at 08/10/16 0446  Gross per 24 hour  Intake              360 ml  Output             2070 ml  Net            -1710 ml   Filed Weights   08/08/16 0544 08/09/16 0455 08/10/16 0438  Weight: 89.5 kg (197 lb 5 oz) 89.2 kg (196 lb 9.6 oz) 86.9 kg (191 lb 9.6 oz)    General appearance: alert, cooperative, appears stated age and no distress Resp: Improved air entry. Continues to have crackles at the bases. No wheezing.  Cardio: regular rate and rhythm, S1, S2 normal, no murmur, click, rub or gallop GI: soft, non-tender; bowel sounds normal; no masses,  no organomegaly Extremities: Pitting edema noted bilateral lower extremities. Not much improvement compared to yesterday. Neurologic: No focal deficits.  Lab Results:  Data Reviewed: I have personally reviewed following labs and  imaging studies  CBC:  Recent Labs Lab 08/07/16 1850 08/08/16 0159 08/09/16 0132 08/10/16 0204  WBC 7.0 6.5 6.4 5.5  NEUTROABS  --  4.2  --   --   HGB 13.9 13.0 10.4* 12.1  HCT 40.8 39.0 31.1* 36.4  MCV 94.2 94.9 95.4 95.5  PLT 298 292 225 081    Basic Metabolic Panel:  Recent Labs Lab 08/07/16 1850 08/08/16 0159 08/09/16 0132 08/10/16 0204  NA 136 136 138 141  K 4.1 3.8 3.7 3.3*  CL 107 102 107 106  CO2 23 25 25 28   GLUCOSE 106* 129* 104* 100*  BUN 47* 45* 43* 34*   CREATININE 1.90* 1.83* 1.68* 1.22*  CALCIUM 7.7* 7.6* 7.6* 7.8*  PHOS  --   --   --  5.0*    GFR: Estimated Creatinine Clearance: 47.6 mL/min (A) (by C-G formula based on SCr of 1.22 mg/dL (H)).  Liver Function Tests:  Recent Labs Lab 08/07/16 1850 08/09/16 0132 08/10/16 0204  AST 25 16  --   ALT 15 10*  --   ALKPHOS 84 58  --   BILITOT 0.5 0.1*  --   PROT 5.4* 4.6*  --   ALBUMIN 1.9* 2.3* 2.1*    Coagulation Profile:  Recent Labs Lab 08/08/16 0159  INR 1.07    Cardiac Enzymes:  Recent Labs Lab 08/07/16 1850  TROPONINI <0.03    CBG:  Recent Labs Lab 08/08/16 0623  GLUCAP 98    Lipid Profile:  Recent Labs  08/08/16 0159  CHOL 281*  HDL 57  LDLCALC 185*  TRIG 196*  CHOLHDL 4.9     Recent Results (from the past 240 hour(s))  Culture, expectorated sputum-assessment     Status: None   Collection Time: 08/08/16  4:02 AM  Result Value Ref Range Status   Specimen Description EXPECTORATED SPUTUM  Final   Special Requests NONE  Final   Sputum evaluation THIS SPECIMEN IS ACCEPTABLE FOR SPUTUM CULTURE  Final   Report Status 08/08/2016 FINAL  Final  Culture, respiratory (NON-Expectorated)     Status: None (Preliminary result)   Collection Time: 08/08/16  4:02 AM  Result Value Ref Range Status   Specimen Description EXPECTORATED SPUTUM  Final   Special Requests NONE Reflexed from [id number]  Final   Gram Stain   Final    FEW SQUAMOUS EPITHELIAL CELLS PRESENT FEW WBC PRESENT, PREDOMINANTLY MONONUCLEAR RARE GRAM POSITIVE COCCI IN PAIRS    Culture CULTURE REINCUBATED FOR BETTER GROWTH  Final   Report Status PENDING  Incomplete      Radiology Studies: US Renal  Result Date: 08/08/2016 CLINICAL DATA:  Acute renal injury. EXAM: RENAL / URINARY TRACT ULTRASOUND COMPLETE COMPARISON:  CT abdomen dated 08/07/2016. FINDINGS: Right Kidney: Length: 12.4 cm. Echogenicity within normal limits. No mass or hydronephrosis visualized. Left Kidney: Length: 12.4 cm.  Echogenicity within normal limits. No mass or hydronephrosis visualized. 4 mm left renal stone, compatible with 1 of the 2 similar size stones seen within the left renal pelvis on a recent CT. Bladder: Bladder is decompressed. IMPRESSION: 1. Left nephrolithiasis. Left kidney is otherwise unremarkable. No hydronephrosis. 2. Right kidney appears normal. 3. Bladder is decompressed. Electronically Signed   By: Franki Cabot M.D.   On: 08/08/2016 12:23   Dg Chest Port 1 View  Result Date: 08/09/2016 CLINICAL DATA:  Shortness of breath and cough for 1 week EXAM: PORTABLE CHEST 1 VIEW COMPARISON:  August 07, 2016 chest radiograph and chest CT FINDINGS: There  is no edema or consolidation. The heart size and pulmonary vascularity are normal. No adenopathy. No bone lesions. IMPRESSION: No edema or consolidation. Electronically Signed   By: [name] III M.D.   On: 08/09/2016 10:30     Medications:  Scheduled: . albumin human  100 g Intravenous Once  . amLODipine  10 mg Oral Daily  . atorvastatin  20 mg Oral q1800  . azithromycin  250 mg Oral Daily  . enoxaparin (LOVENOX) injection  40 mg Subcutaneous Q24H  . furosemide  160 mg Intravenous Q6H  . ipratropium-albuterol  3 mL Nebulization QID  . nicotine  21 mg Transdermal Daily  . sodium chloride flush  3 mL Intravenous Q12H   Continuous:  QMG:[id number] **OR** acetaminophen, albuterol, hydrALAZINE, ondansetron **OR** ondansetron (ZOFRAN) IV, zolpidem  Assessment/Plan:  Principal Problem:   Anasarca Active Problems:   Tobacco abuse   Essential hypertension   AKI (acute kidney injury) ([employer])   Dyspnea    Anasarca Secondary to nephrotic syndrome Patient's echocardiogram shows normal systolic function. LFTs are normal. No history of liver disease. No history of alcohol intake. UA does show significant proteinuria. Patient started on high dose Lasix by nephrology. Continue to monitor ins and outs. Daily weights. Urine output has picked  up.   AKI (acute kidney injury)/nephrotic syndrome No previously known history of kidney disease. She does have a history of hypertension but is on only one medication at home. Is followed regularly by her primary care provider. Nephrology is following. Plan is for renal biopsy on Monday. Lasix as discussed above. Creatinine shows improvement. SPEP has been ordered.  Dyspnea Dyspnea is likely due to fluid overload. Chest x-ray did not show any pulmonary edema or infiltrates. Dyspnea has improved this morning. She was given ceftriaxone and azithromycin in the emergency Department. WBC is normal. She is noted to have low-grade fever. Could have some form of upper respiratory tract infection. Currently on oral azithromycin.  Essential hypertension  Started on amlodipine. Blood pressure not yet optimally controlled. Add beta blocker. Continue current dose of amlodipine for now.  Tobacco abuse Patient was counseled. Nicotine patch.    DVT Prophylaxis: Lovenox    Code Status: Full code  Family Communication: Discussed with the patient Disposition Plan: Management as outlined above. Await improvement in lower extremity edema and further evaluation of nephrotic syndrome.    LOS: 3 days   [facility] Pager [phone] 08/10/2016, 8:53 AM  If 7PM-7AM, please contact night-coverage at [email or web address], password [email or web address]

## 2016-08-10 NOTE — Progress Notes (Signed)
Subjective:  Had 2100 of urine- crt down more but still quite edematous "I feel so much better"  Objective Vital signs in last 24 hours: Vitals:   08/09/16 1916 08/09/16 1959 08/10/16 0438 08/10/16 0544  BP: (!) 148/78   (!) 159/86  Pulse: 92   90  Resp: 18   16  Temp: 98.4 F (36.9 C)   98.7 F (37.1 C)  TempSrc: Oral   Oral  SpO2: 97% 99%  97%  Weight:   86.9 kg (191 lb 9.6 oz)   Height:       Weight change: -2.268 kg (-5 lb)  Intake/Output Summary (Last 24 hours) at 08/10/16 7867 Last data filed at 08/10/16 0446  Gross per 24 hour  Intake              360 ml  Output             2070 ml  Net            -1710 ml    Assessment/Plan: 71 year old black female with very little past medical history presenting with what appears to be nephrotic syndrome with some acute kidney injury 1.Renal- normal creatinine in August 2017, creatinine of 1.3 last week, now creatinine of 1.9-->1.6-->1.22. Most likely result of this intrarenal process. Also with proteinuria which appears to be nephrotic associated with hypoalbuminemia. Patient will need a percutaneous kidney biopsy to make a diagnosis of the etiology of this- tentatively scheduled for tomorrow.  Will also do a little bit of a serologic workup including hepatitis (neg), HIV (was neg), ANA and SPEP pending.  Also in the differential would be membranous GN which can sometimes be associated with malignancy so I did discuss that with the patient that she needs to have age appropriate cancer screening- she said she was behind on her mammogram, actually has a colonoscopy scheduled for next month but has not had a Pap smear in quite some time.  Chest x-ray did not show any obvious abnormalities 2. Hypertension/volume  - volume overloaded in the setting of nephrotic syndrome. Giving high-dose IV Lasix- better on 160 q 6 3. Anemia  - not an issue at this time-   Pharrah Rottman A    Labs: Basic Metabolic Panel:  Recent Labs Lab  08/08/16 0159 08/09/16 0132 08/10/16 0204  NA 136 138 141  K 3.8 3.7 3.3*  CL 102 107 106  CO2 25 25 28   GLUCOSE 129* 104* 100*  BUN 45* 43* 34*  CREATININE 1.83* 1.68* 1.22*  CALCIUM 7.6* 7.6* 7.8*  PHOS  --   --  5.0*   Liver Function Tests:  Recent Labs Lab 08/07/16 1850 08/09/16 0132 08/10/16 0204  AST 25 16  --   ALT 15 10*  --   ALKPHOS 84 58  --   BILITOT 0.5 0.1*  --   PROT 5.4* 4.6*  --   ALBUMIN 1.9* 2.3* 2.1*   No results for input(s): LIPASE, AMYLASE in the last 168 hours. No results for input(s): AMMONIA in the last 168 hours. CBC:  Recent Labs Lab 08/07/16 1850 08/08/16 0159 08/09/16 0132 08/10/16 0204  WBC 7.0 6.5 6.4 5.5  NEUTROABS  --  4.2  --   --   HGB 13.9 13.0 10.4* 12.1  HCT 40.8 39.0 31.1* 36.4  MCV 94.2 94.9 95.4 95.5  PLT 298 292 225 281   Cardiac Enzymes:  Recent Labs Lab 08/07/16 1850  TROPONINI <0.03   CBG:  Recent Labs Lab 08/08/16 [phone]  GLUCAP 98    Iron Studies:   Recent Labs  08/10/16 0204  IRON 29  TIBC 115*  FERRITIN 206   Studies/Results: US Renal  Result Date: 08/08/2016 CLINICAL DATA:  Acute renal injury. EXAM: RENAL / URINARY TRACT ULTRASOUND COMPLETE COMPARISON:  CT abdomen dated 08/07/2016. FINDINGS: Right Kidney: Length: 12.4 cm. Echogenicity within normal limits. No mass or hydronephrosis visualized. Left Kidney: Length: 12.4 cm. Echogenicity within normal limits. No mass or hydronephrosis visualized. 4 mm left renal stone, compatible with 1 of the 2 similar size stones seen within the left renal pelvis on a recent CT. Bladder: Bladder is decompressed. IMPRESSION: 1. Left nephrolithiasis. Left kidney is otherwise unremarkable. No hydronephrosis. 2. Right kidney appears normal. 3. Bladder is decompressed. Electronically Signed   By: Franki Cabot M.D.   On: 08/08/2016 12:23   Dg Chest Port 1 View  Result Date: 08/09/2016 CLINICAL DATA:  Shortness of breath and cough for 1 week EXAM: PORTABLE CHEST 1  VIEW COMPARISON:  August 07, 2016 chest radiograph and chest CT FINDINGS: There is no edema or consolidation. The heart size and pulmonary vascularity are normal. No adenopathy. No bone lesions. IMPRESSION: No edema or consolidation. Electronically Signed   By: [name] III M.D.   On: 08/09/2016 10:30   Medications: Infusions:   Scheduled Medications: . albumin human  100 g Intravenous Once  . amLODipine  10 mg Oral Daily  . atorvastatin  20 mg Oral q1800  . azithromycin  250 mg Oral Daily  . enoxaparin (LOVENOX) injection  40 mg Subcutaneous Q24H  . furosemide  160 mg Intravenous Q6H  . ipratropium-albuterol  3 mL Nebulization QID  . nicotine  21 mg Transdermal Daily  . sodium chloride flush  3 mL Intravenous Q12H    have reviewed scheduled and prn medications.  Physical Exam: General: NAD- still c/o congestion Heart: RRR Lungs: dec BS at bases Abdomen: soft, non tender Extremities: 2-3+ pitting edema    08/10/2016,8:26 AM  LOS: 3 days

## 2016-08-11 ENCOUNTER — Inpatient Hospital Stay (HOSPITAL_COMMUNITY): Payer: Medicare Other

## 2016-08-11 LAB — BASIC METABOLIC PANEL
Anion gap: 7 (ref 5–15)
BUN: 32 mg/dL — ABNORMAL HIGH (ref 6–20)
CALCIUM: 7.7 mg/dL — AB (ref 8.9–10.3)
CO2: 27 mmol/L (ref 22–32)
CREATININE: 1.36 mg/dL — AB (ref 0.44–1.00)
Chloride: 106 mmol/L (ref 101–111)
GFR, EST AFRICAN AMERICAN: 45 mL/min — AB (ref >60)
GFR, EST NON AFRICAN AMERICAN: 38 mL/min — AB (ref >60)
Glucose, Bld: 98 mg/dL (ref 65–99)
Potassium: 3.7 mmol/L (ref 3.5–5.1)
Sodium: 140 mmol/L (ref 135–145)

## 2016-08-11 LAB — PROTEIN ELECTROPHORESIS, SERUM
A/G RATIO SPE: 0.8 (ref 0.7–1.7)
ALBUMIN ELP: 2 g/dL — AB (ref 2.9–4.4)
ALPHA-2-GLOBULIN: 0.9 g/dL (ref 0.4–1.0)
Alpha-1-Globulin: 0.2 g/dL (ref 0.0–0.4)
BETA GLOBULIN: 0.7 g/dL (ref 0.7–1.3)
Gamma Globulin: 0.7 g/dL (ref 0.4–1.8)
Globulin, Total: 2.5 g/dL (ref 2.2–3.9)
Total Protein ELP: 4.5 g/dL — ABNORMAL LOW (ref 6.0–8.5)

## 2016-08-11 LAB — ANTINUCLEAR ANTIBODIES, IFA: ANA Ab, IFA: NEGATIVE

## 2016-08-11 MED ORDER — MIDAZOLAM HCL 2 MG/2ML IJ SOLN
INTRAMUSCULAR | Status: AC | PRN
  Administered 2016-08-11: 1 mg via INTRAVENOUS

## 2016-08-11 MED ORDER — FENTANYL CITRATE (PF) 100 MCG/2ML IJ SOLN
INTRAMUSCULAR | Status: AC
  Filled 2016-08-11: qty 2

## 2016-08-11 MED ORDER — TORSEMIDE 20 MG PO TABS
100.0000 mg | ORAL_TABLET | Freq: Two times a day (BID) | ORAL | Status: DC
  Administered 2016-08-11 – 2016-08-13 (×4): 100 mg via ORAL
  Filled 2016-08-11 (×4): qty 5

## 2016-08-11 MED ORDER — MIDAZOLAM HCL 2 MG/2ML IJ SOLN
INTRAMUSCULAR | Status: AC
  Filled 2016-08-11: qty 2

## 2016-08-11 MED ORDER — LIDOCAINE HCL 1 % IJ SOLN
INTRAMUSCULAR | Status: AC
  Filled 2016-08-11: qty 20

## 2016-08-11 MED ORDER — FENTANYL CITRATE (PF) 100 MCG/2ML IJ SOLN
INTRAMUSCULAR | Status: AC | PRN
  Administered 2016-08-11: 50 ug via INTRAVENOUS

## 2016-08-11 NOTE — Sedation Documentation (Signed)
Patient is resting comfortably. 

## 2016-08-11 NOTE — Care Management Important Message (Signed)
Important Message  Patient Details  Name: Mary Chapman MRN: [id number] Date of Birth: 04-Jan-1946   Medicare Important Message Given:  Yes    Nathen May 08/11/2016, 2:24 PM

## 2016-08-11 NOTE — Plan of Care (Signed)
Problem: Safety: Goal: Ability to remain free from injury will improve Outcome: Progressing Pt  Remained free of injury during shift.   Problem: Pain Managment: Goal: General experience of comfort will improve Outcome: Progressing Pt complained of no pain during shift.

## 2016-08-11 NOTE — Progress Notes (Signed)
[facility] PROGRESS NOTE  Mary Chapman TGG:[id number] DOB: Jan 29, 1946 DOA: 08/07/2016  PCP: [facility]  Brief History/Interval Summary: 71 year old African-American female with a past medical history of hypertension who presented with complaints of shortness of breath and leg swelling. Her symptoms started about a week ago with leg swelling. She was given diuretics by her primary care provider with no improvement. She subsequently decided to come into the hospital. She was found to have elevated creatinine. She was hospitalized for further management.  Reason for Visit: Nephrotic syndrome  Consultants: Nephrology. Interventional radiology.  Procedures:   Transthoracic echocardiogram Study Conclusions  - Left ventricle: The cavity size was normal. Systolic function was   vigorous. The estimated ejection fraction was in the range of 65%   to 70%. Wall motion was normal; there were no regional wall   motion abnormalities. Doppler parameters are consistent with   abnormal left ventricular relaxation (grade 1 diastolic   dysfunction). There was no evidence of elevated ventricular   filling pressure by Doppler parameters. - Aortic valve: There was no regurgitation. - Aortic root: The aortic root was normal in size. - Mitral valve: There was mild regurgitation. - Right ventricle: Systolic function was normal. - Right atrium: The atrium was normal in size. - Tricuspid valve: There was mild regurgitation. - Pulmonic valve: There was no regurgitation. - Pulmonary arteries: Systolic pressure was within the normal   range. - Inferior vena cava: The vessel was dilated. The respirophasic   diameter changes were blunted (< 50%), consistent with elevated   central venous pressure. - Pericardium, extracardiac: There was no pericardial effusion.  Impressions: - Consider non-cardiac causes for anasarca and dyspnea.  Renal biopsy is planned for  3/26  Antibiotics: None  Subjective/Interval History: Patient feels well. Denies any worsening in her breathing. No cough. Denies any chest pain. Feels that her leg swelling has improved.   ROS: Denies any nausea or vomiting.  Objective:  Vital Signs  Vitals:   08/10/16 2025 08/10/16 2041 08/11/16 0523 08/11/16 0720  BP: 138/76  (!) 146/77   Pulse: 97  69   Resp: 18  18   Temp: 99.1 F (37.3 C)  98.1 F (36.7 C)   TempSrc: Oral  Oral   SpO2: 98% 98% 98% 94%  Weight:   86 kg (189 lb 11.2 oz)   Height:        Intake/Output Summary (Last 24 hours) at 08/11/16 0928 Last data filed at 08/11/16 0521  Gross per 24 hour  Intake             1008 ml  Output              501 ml  Net              507 ml   Filed Weights   08/09/16 0455 08/10/16 0438 08/11/16 0523  Weight: 89.2 kg (196 lb 9.6 oz) 86.9 kg (191 lb 9.6 oz) 86 kg (189 lb 11.2 oz)    General appearance: alert, cooperative, appears stated age and no distress Resp: Improved air entry. Continues to have crackles at the bases. No wheezing.  Cardio: regular rate and rhythm, S1, S2 normal, no murmur, click, rub or gallop GI: soft, non-tender; bowel sounds normal; no masses,  no organomegaly Extremities: Pitting edema noted bilateral lower extremities. Perhaps a slight improvement. Neurologic: No focal deficits.  Lab Results:  Data Reviewed: I have personally reviewed following labs and imaging studies  CBC:  Recent Labs  Lab 08/07/16 1850 08/08/16 0159 08/09/16 0132 08/10/16 0204  WBC 7.0 6.5 6.4 5.5  NEUTROABS  --  4.2  --   --   HGB 13.9 13.0 10.4* 12.1  HCT 40.8 39.0 31.1* 36.4  MCV 94.2 94.9 95.4 95.5  PLT 298 292 225 222    Basic Metabolic Panel:  Recent Labs Lab 08/07/16 1850 08/08/16 0159 08/09/16 0132 08/10/16 0204 08/11/16 0218  NA 136 136 138 141 140  K 4.1 3.8 3.7 3.3* 3.7  CL 107 102 107 106 106  CO2 23 25 25 28 27   GLUCOSE 106* 129* 104* 100* 98  BUN 47* 45* 43* 34* 32*  CREATININE  1.90* 1.83* 1.68* 1.22* 1.36*  CALCIUM 7.7* 7.6* 7.6* 7.8* 7.7*  PHOS  --   --   --  5.0*  --     GFR: Estimated Creatinine Clearance: 42.5 mL/min (A) (by C-G formula based on SCr of 1.36 mg/dL (H)).  Liver Function Tests:  Recent Labs Lab 08/07/16 1850 08/09/16 0132 08/10/16 0204  AST 25 16  --   ALT 15 10*  --   ALKPHOS 84 58  --   BILITOT 0.5 0.1*  --   PROT 5.4* 4.6*  --   ALBUMIN 1.9* 2.3* 2.1*    Coagulation Profile:  Recent Labs Lab 08/08/16 0159  INR 1.07    Cardiac Enzymes:  Recent Labs Lab 08/07/16 1850  TROPONINI <0.03    CBG:  Recent Labs Lab 08/08/16 0623  GLUCAP 98    Lipid Profile: No results for input(s): CHOL, HDL, LDLCALC, TRIG, CHOLHDL, LDLDIRECT in the last 72 hours.   Recent Results (from the past 240 hour(s))  Culture, expectorated sputum-assessment     Status: None   Collection Time: 08/08/16  4:02 AM  Result Value Ref Range Status   Specimen Description EXPECTORATED SPUTUM  Final   Special Requests NONE  Final   Sputum evaluation THIS SPECIMEN IS ACCEPTABLE FOR SPUTUM CULTURE  Final   Report Status 08/08/2016 FINAL  Final  Culture, respiratory (NON-Expectorated)     Status: None   Collection Time: 08/08/16  4:02 AM  Result Value Ref Range Status   Specimen Description EXPECTORATED SPUTUM  Final   Special Requests NONE Reflexed from [id number]  Final   Gram Stain   Final    FEW SQUAMOUS EPITHELIAL CELLS PRESENT FEW WBC PRESENT, PREDOMINANTLY MONONUCLEAR RARE GRAM POSITIVE COCCI IN PAIRS    Culture Consistent with normal respiratory flora.  Final   Report Status 08/10/2016 FINAL  Final      Radiology Studies: Dg Chest Port 1 View  Result Date: 08/09/2016 CLINICAL DATA:  Shortness of breath and cough for 1 week EXAM: PORTABLE CHEST 1 VIEW COMPARISON:  August 07, 2016 chest radiograph and chest CT FINDINGS: There is no edema or consolidation. The heart size and pulmonary vascularity are normal. No adenopathy. No bone lesions.  IMPRESSION: No edema or consolidation. Electronically Signed   By: [name] III M.D.   On: 08/09/2016 10:30     Medications:  Scheduled: . amLODipine  10 mg Oral Daily  . atorvastatin  20 mg Oral q1800  . azithromycin  250 mg Oral Daily  . enoxaparin (LOVENOX) injection  40 mg Subcutaneous Q24H  . furosemide  160 mg Intravenous Q6H  . ipratropium-albuterol  3 mL Nebulization QID  . metoprolol tartrate  25 mg Oral BID  . nicotine  21 mg Transdermal Daily  . sodium chloride flush  3 mL Intravenous Q12H  Continuous:  BJS:[id number] **OR** acetaminophen, albuterol, hydrALAZINE, ondansetron **OR** ondansetron (ZOFRAN) IV, zolpidem  Assessment/Plan:  Principal Problem:   Anasarca Active Problems:   Tobacco abuse   Essential hypertension   AKI (acute kidney injury) ([place])   Dyspnea    Anasarca Secondary to nephrotic syndrome Patient's echocardiogram shows normal systolic function. LFTs are normal. No history of liver disease. No history of alcohol intake. UA does show significant proteinuria. Patient started on high dose Lasix by nephrology. Continue to monitor ins and outs. Daily weights. Urine output has picked up. She is diuresing. Weight is decreasing.  AKI (acute kidney injury)/nephrotic syndrome No previously known history of kidney disease. She does have a history of hypertension but is on only one medication at home. Followed regularly by her primary care provider. Nephrology is following. Plan is for renal biopsy on Monday. Lasix as discussed above. Creatinine is stable. SPEP has been ordered.  Dyspnea Dyspnea was likely due to fluid overload. Chest x-ray did not show any pulmonary edema or infiltrates. Dyspnea has improved. She was given ceftriaxone and azithromycin in the emergency Department. WBC is normal. She is noted to have low-grade fever. Could have some form of upper respiratory tract infection. Currently on oral azithromycin.  Essential hypertension   Continue amlodipine and metoprolol. Blood pressure has improved. Continue to monitor for now.  Tobacco abuse Patient was counseled. Nicotine patch.    DVT Prophylaxis: Lovenox    Code Status: Full code  Family Communication: Discussed with the patient Disposition Plan: Management as outlined above.    LOS: 4 days   [facility] Pager [phone] 08/11/2016, 9:28 AM  If 7PM-7AM, please contact night-coverage at [email or web address], password [facility]

## 2016-08-11 NOTE — Progress Notes (Addendum)
[facility] Rounding Note  Subjective:   Had renal biopsy this AM Denies any back or flank pain or bloody urine Still has rather impressive anasarca Says fluid situation is better (legs, breathing) though UOP not fully recorded, weight not changing much Not requiring O2 Says would like to be able to go home, followup as outpt  Objective Vital signs in last 24 hours: Vitals:   08/11/16 1123 08/11/16 1126 08/11/16 1131 08/11/16 1138  BP: (!) 142/82 (!) 142/75 (!) 141/77 140/80  Pulse: 77 76 85 85  Resp: 18 18 16 18   Temp:      TempSrc:      SpO2: 97% 100% 97% 95%  Weight:      Height:       Weight change: -0.862 kg (-1 lb 14.4 oz)  Intake/Output Summary (Last 24 hours) at 08/11/16 1343 Last data filed at 08/11/16 0521  Gross per 24 hour  Intake              768 ml  Output              300 ml  Net              468 ml    Physical Exam: Very nice older AAF VS as noted + 6 cm JVD Bilat soft exp wheezes, few crackles esp L Abd no focal tenderness 2-3+ pitting edema to the hips   Recent Labs Lab 08/09/16 0132 08/10/16 0204 08/11/16 0218  NA 138 141 140  K 3.7 3.3* 3.7  CL 107 106 106  CO2 25 28 27   GLUCOSE 104* 100* 98  BUN 43* 34* 32*  CREATININE 1.68* 1.22* 1.36*  CALCIUM 7.6* 7.8* 7.7*  PHOS  --  5.0*  --     Recent Labs Lab 08/07/16 1850 08/09/16 0132 08/10/16 0204  AST 25 16  --   ALT 15 10*  --   ALKPHOS 84 58  --   BILITOT 0.5 0.1*  --   PROT 5.4* 4.6*  --   ALBUMIN 1.9* 2.3* 2.1*    Recent Labs Lab 08/07/16 1850 08/08/16 0159 08/09/16 0132 08/10/16 0204  WBC 7.0 6.5 6.4 5.5  NEUTROABS  --  4.2  --   --   HGB 13.9 13.0 10.4* 12.1  HCT 40.8 39.0 31.1* 36.4  MCV 94.2 94.9 95.4 95.5  PLT 298 292 225 281    Recent Labs Lab 08/07/16 1850  TROPONINI <0.03    Recent Labs Lab 08/08/16 0623  GLUCAP 98    Recent Labs  08/10/16 0204  IRON 29  TIBC 115*  FERRITIN 206   Studies/Results: US Biopsy  Result Date: 08/11/2016 INDICATION:  Necrotic center EXAM: ULTRASOUND-GUIDED BIOPSY RANDOM RENAL CORTEX CORE MEDICATIONS: None. ANESTHESIA/SEDATION: Fentanyl 50 mcg IV; Versed 1 mg IV Moderate Sedation Time:  10 The patient was continuously monitored during the procedure by the interventional radiology nurse under my direct supervision. FLUOROSCOPY TIME:  None. COMPLICATIONS: None immediate. PROCEDURE: Informed written consent was obtained from the patient after a thorough discussion of the procedural risks, benefits and alternatives. All questions were addressed. Maximal Sterile Barrier Technique was utilized including caps, mask, sterile gowns, sterile gloves, sterile drape, hand hygiene and skin antiseptic. A timeout was performed prior to the initiation of the procedure. The back was prepped with ChloraPrep in a sterile fashion, and a sterile drape was applied covering the operative field. A sterile gown and sterile gloves were used for the procedure. Under sonographic guidance, 2 16  gauge core biopsies of the left lower pole renal cortex were obtained. Final imaging was performed. Patient tolerated the procedure well without complication. Vital sign monitoring by nursing staff during the procedure will continue as patient is in the special procedures unit for post procedure observation. FINDINGS: The images document guide needle placement within the left lower pole renal cortex. Post biopsy images demonstrate no hemorrhage. IMPRESSION: Successful ultrasound-guided core biopsy of the left lower pole renal cortex. Electronically Signed   By: Marybelle Killings M.D.   On: 08/11/2016 12:13    Scheduled Medications: . amLODipine  10 mg Oral Daily  . atorvastatin  20 mg Oral q1800  . azithromycin  250 mg Oral Daily  . enoxaparin (LOVENOX) injection  40 mg Subcutaneous Q24H  . fentaNYL      . furosemide  160 mg Intravenous Q6H  . ipratropium-albuterol  3 mL Nebulization QID  . lidocaine      . metoprolol tartrate  25 mg Oral BID  . midazolam       . nicotine  21 mg Transdermal Daily  . sodium chloride flush  3 mL Intravenous Q12H   Assessment/Plan:   71 year old black female with very little past medical history presenting with what appears to be nephrotic syndrome with some acute kidney injury. Outpt eval notable for neg HIV, neg ANA, Hep serologies all neg, UPC ?>20 gm proteinuria, UA with 6-10 RBC's. Kidneys 12.4 cm, normal echogenicity. SPEP pending. Renal bx done 08/11/16  1. Renal- normal creatinine in August 2017, creatinine of 1.3 last week, now creatinine of 1.9->1.6->1.22->1.36. Most likely result of this intrarenal process. Also with proteinuria - nephrotic range by UPC (and low albumin). Diagnostic renal biopsy was done today. In the DDX - membranous (can be assoc w/malig - no PAP or mammo in some time, due for colonoscopy next month, CXR neg), adult minimal change, FSG. 2. Hypertension/volume  - volume overloaded in the setting of nephrotic syndrome. Giving high-dose IV Lasix- on 160 q 6 hr. Unclear how well diuresing as UOP not getting recorded and weights not changing much.  I have no problem changing to a po diuretic regimen. For absorption purposes torsemide probably superior to furosemide - will stop IV->transition to torsemide 100 po BID.  If volume and resp at least remain stable, then could prob f/u with Dr. Moshe Cipro as out pt. Reassess in the AM. 3. Anemia  - not an issue at this time   Jamal Maes, MD [place] Pager 08/11/2016, 2:06 PM

## 2016-08-11 NOTE — Procedures (Signed)
Random Renal Core Bx 16 g L renal lower pole cortex No comp/EBL

## 2016-08-12 LAB — CBC
HCT: 36.8 % (ref 36.0–46.0)
Hemoglobin: 11.9 g/dL — ABNORMAL LOW (ref 12.0–15.0)
MCH: 31.6 pg (ref 26.0–34.0)
MCHC: 32.3 g/dL (ref 30.0–36.0)
MCV: 97.6 fL (ref 78.0–100.0)
PLATELETS: 309 10*3/uL (ref 150–400)
RBC: 3.77 MIL/uL — ABNORMAL LOW (ref 3.87–5.11)
RDW: 14 % (ref 11.5–15.5)
WBC: 6.4 10*3/uL (ref 4.0–10.5)

## 2016-08-12 LAB — RENAL FUNCTION PANEL
ALBUMIN: 1.8 g/dL — AB (ref 3.5–5.0)
Anion gap: 10 (ref 5–15)
BUN: 42 mg/dL — ABNORMAL HIGH (ref 6–20)
CHLORIDE: 102 mmol/L (ref 101–111)
CO2: 28 mmol/L (ref 22–32)
CREATININE: 1.87 mg/dL — AB (ref 0.44–1.00)
Calcium: 7.8 mg/dL — ABNORMAL LOW (ref 8.9–10.3)
GFR calc Af Amer: 30 mL/min — ABNORMAL LOW (ref >60)
GFR, EST NON AFRICAN AMERICAN: 26 mL/min — AB (ref >60)
Glucose, Bld: 110 mg/dL — ABNORMAL HIGH (ref 65–99)
Phosphorus: 5.3 mg/dL — ABNORMAL HIGH (ref 2.5–4.6)
Potassium: 3.7 mmol/L (ref 3.5–5.1)
SODIUM: 140 mmol/L (ref 135–145)

## 2016-08-12 MED ORDER — METOLAZONE 5 MG PO TABS
5.0000 mg | ORAL_TABLET | Freq: Two times a day (BID) | ORAL | Status: DC
  Administered 2016-08-12 – 2016-08-13 (×3): 5 mg via ORAL
  Filled 2016-08-12 (×3): qty 1

## 2016-08-12 NOTE — Progress Notes (Signed)
[facility] PROGRESS NOTE  Mary Chapman ZYS:[id number] DOB: [birth year] DOA: 08/07/2016  PCP: [place] [facility]  Brief History/Interval Summary: 71 year old African-American female with a past medical history of hypertension who presented with complaints of shortness of breath and leg swelling. Her symptoms started about a week ago with leg swelling. She was given diuretics by her primary care provider with no improvement. She subsequently decided to come into the hospital. She was found to have elevated creatinine. She was hospitalized for further management.  Reason for Visit: Nephrotic syndrome  Consultants: Nephrology. Interventional radiology.  Procedures:   Transthoracic echocardiogram Study Conclusions  - Left ventricle: The cavity size was normal. Systolic function was   vigorous. The estimated ejection fraction was in the range of 65%   to 70%. Wall motion was normal; there were no regional wall   motion abnormalities. Doppler parameters are consistent with   abnormal left ventricular relaxation (grade 1 diastolic   dysfunction). There was no evidence of elevated ventricular   filling pressure by Doppler parameters. - Aortic valve: There was no regurgitation. - Aortic root: The aortic root was normal in size. - Mitral valve: There was mild regurgitation. - Right ventricle: Systolic function was normal. - Right atrium: The atrium was normal in size. - Tricuspid valve: There was mild regurgitation. - Pulmonic valve: There was no regurgitation. - Pulmonary arteries: Systolic pressure was within the normal   range. - Inferior vena cava: The vessel was dilated. The respirophasic   diameter changes were blunted (< 50%), consistent with elevated   central venous pressure. - Pericardium, extracardiac: There was no pericardial effusion.  Impressions: - Consider non-cardiac causes for anasarca and dyspnea.  Renal biopsy  3/26  Antibiotics: None  Subjective/Interval History: Patient feels poorly this morning. Complains of more shortness of breath today. Leg swelling is about the same. She didn't make much urine yesterday.   ROS: Denies any nausea or vomiting.  Objective:  Vital Signs  Vitals:   08/11/16 2055 08/12/16 0331 08/12/16 0801 08/12/16 0854  BP: (!) 143/76 122/68  125/68  Pulse: 79   78  Resp: 18 18    Temp: 98.5 F (36.9 C) 98.8 F (37.1 C)    TempSrc: Oral Oral    SpO2: 96% 95% 93%   Weight:  86.4 kg (190 lb 7.6 oz)    Height:        Intake/Output Summary (Last 24 hours) at 08/12/16 0953 Last data filed at 08/12/16 0331  Gross per 24 hour  Intake                0 ml  Output              700 ml  Net             -700 ml   Filed Weights   08/10/16 0438 08/11/16 0523 08/12/16 0331  Weight: 86.9 kg (191 lb 9.6 oz) 86 kg (189 lb 11.2 oz) 86.4 kg (190 lb 7.6 oz)    General appearance: alert, cooperative, appears stated age and no distress Resp: Worsening breath sounds today. Few crackles at the bases. Mild tachypnea.   Cardio: regular rate and rhythm, S1, S2 normal, no murmur, click, rub or gallop GI: soft, non-tender; bowel sounds normal; no masses,  no organomegaly Extremities: Pitting edema noted bilateral lower extremities. Hasn't had any significant improvement over the last 2 days. Neurologic: No focal deficits.  Lab Results:  Data Reviewed: I have personally reviewed following labs and  imaging studies  CBC:  Recent Labs Lab 08/07/16 1850 08/08/16 0159 08/09/16 0132 08/10/16 0204 08/12/16 0422  WBC 7.0 6.5 6.4 5.5 6.4  NEUTROABS  --  4.2  --   --   --   HGB 13.9 13.0 10.4* 12.1 11.9*  HCT 40.8 39.0 31.1* 36.4 36.8  MCV 94.2 94.9 95.4 95.5 97.6  PLT 298 292 225 281 710    Basic Metabolic Panel:  Recent Labs Lab 08/08/16 0159 08/09/16 0132 08/10/16 0204 August 27, 2016 0218 08/12/16 0423  NA 136 138 141 140 140  K 3.8 3.7 3.3* 3.7 3.7  CL 102 107 106 106  102  CO2 25 25 28 27 28   GLUCOSE 129* 104* 100* 98 110*  BUN 45* 43* 34* 32* 42*  CREATININE 1.83* 1.68* 1.22* 1.36* 1.87*  CALCIUM 7.6* 7.6* 7.8* 7.7* 7.8*  PHOS  --   --  5.0*  --  5.3*    GFR: Estimated Creatinine Clearance: 31 mL/min (A) (by C-G formula based on SCr of 1.87 mg/dL (H)).  Liver Function Tests:  Recent Labs Lab 08/07/16 1850 08/09/16 0132 08/10/16 0204 08/12/16 0423  AST 25 16  --   --   ALT 15 10*  --   --   ALKPHOS 84 58  --   --   BILITOT 0.5 0.1*  --   --   PROT 5.4* 4.6*  --   --   ALBUMIN 1.9* 2.3* 2.1* 1.8*    Coagulation Profile:  Recent Labs Lab 08/08/16 0159  INR 1.07    Cardiac Enzymes:  Recent Labs Lab 08/07/16 1850  TROPONINI <0.03    CBG:  Recent Labs Lab 08/08/16 0623  GLUCAP 98    Lipid Profile: No results for input(s): CHOL, HDL, LDLCALC, TRIG, CHOLHDL, LDLDIRECT in the last 72 hours.   Recent Results (from the past 240 hour(s))  Culture, expectorated sputum-assessment     Status: None   Collection Time: 08/08/16  4:02 AM  Result Value Ref Range Status   Specimen Description EXPECTORATED SPUTUM  Final   Special Requests NONE  Final   Sputum evaluation THIS SPECIMEN IS ACCEPTABLE FOR SPUTUM CULTURE  Final   Report Status 08/08/2016 FINAL  Final  Culture, respiratory (NON-Expectorated)     Status: None   Collection Time: 08/08/16  4:02 AM  Result Value Ref Range Status   Specimen Description EXPECTORATED SPUTUM  Final   Special Requests NONE Reflexed from [id number]  Final   Gram Stain   Final    FEW SQUAMOUS EPITHELIAL CELLS PRESENT FEW WBC PRESENT, PREDOMINANTLY MONONUCLEAR RARE GRAM POSITIVE COCCI IN PAIRS    Culture Consistent with normal respiratory flora.  Final   Report Status 08/10/2016 FINAL  Final      Radiology Studies: US Biopsy  Result Date: 08/27/2016 INDICATION: Necrotic center EXAM: ULTRASOUND-GUIDED BIOPSY RANDOM RENAL CORTEX CORE MEDICATIONS: None. ANESTHESIA/SEDATION: Fentanyl 50 mcg IV;  Versed 1 mg IV Moderate Sedation Time:  10 The patient was continuously monitored during the procedure by the interventional radiology nurse under my direct supervision. FLUOROSCOPY TIME:  None. COMPLICATIONS: None immediate. PROCEDURE: Informed written consent was obtained from the patient after a thorough discussion of the procedural risks, benefits and alternatives. All questions were addressed. Maximal Sterile Barrier Technique was utilized including caps, mask, sterile gowns, sterile gloves, sterile drape, hand hygiene and skin antiseptic. A timeout was performed prior to the initiation of the procedure. The back was prepped with ChloraPrep in a sterile fashion, and a sterile drape  was applied covering the operative field. A sterile gown and sterile gloves were used for the procedure. Under sonographic guidance, 2 16 gauge core biopsies of the left lower pole renal cortex were obtained. Final imaging was performed. Patient tolerated the procedure well without complication. Vital sign monitoring by nursing staff during the procedure will continue as patient is in the special procedures unit for post procedure observation. FINDINGS: The images document guide needle placement within the left lower pole renal cortex. Post biopsy images demonstrate no hemorrhage. IMPRESSION: Successful ultrasound-guided core biopsy of the left lower pole renal cortex. Electronically Signed   By: Marybelle Killings M.D.   On: 08/11/2016 12:13     Medications:  Scheduled: . amLODipine  10 mg Oral Daily  . atorvastatin  20 mg Oral q1800  . azithromycin  250 mg Oral Daily  . enoxaparin (LOVENOX) injection  40 mg Subcutaneous Q24H  . ipratropium-albuterol  3 mL Nebulization QID  . metoprolol tartrate  25 mg Oral BID  . nicotine  21 mg Transdermal Daily  . sodium chloride flush  3 mL Intravenous Q12H  . torsemide  100 mg Oral BID   Continuous:  LKG:[id number] **OR** acetaminophen, albuterol, hydrALAZINE, ondansetron **OR**  ondansetron (ZOFRAN) IV, zolpidem  Assessment/Plan:  Principal Problem:   Anasarca Active Problems:   Tobacco abuse   Essential hypertension   AKI (acute kidney injury) ([place])   Dyspnea    Anasarca Secondary to nephrotic syndrome Patient's echocardiogram shows normal systolic function. LFTs are normal. No history of liver disease. No history of alcohol intake. UA does show significant proteinuria. Patient started on high dose Lasix by nephrology. She did diurese some. She was changed over to torsemide yesterday. Urine output has dropped significantly. She has gained some weight as well. Discussed with Dr. Lorrene Reid today. Recommends adding metolazone for now. Continue to monitor ins and outs. Daily weights.  AKI (acute kidney injury)/nephrotic syndrome No previously known history of kidney disease. She does have a history of hypertension but is on only one medication at home. Followed regularly by her primary care provider. Patient underwent renal biopsy on 3/26. Her results are pending. Currently on torsemide and metolazone. Nephrology is following. Rise in creatinine noted. SPEP does not show any monoclonal protein.   Dyspnea Dyspnea was likely due to fluid overload. Chest x-ray did not show any pulmonary edema or infiltrates. She was given diuretics. Dyspnea has improved slightly. She was given ceftriaxone and azithromycin in the emergency Department. WBC is normal. She was noted to have low-grade fever. Could have some form of upper respiratory tract infection. Currently on oral azithromycin. She likely has an element of chronic lung disease considering history of smoking.  Essential hypertension  Continue amlodipine and metoprolol. Blood pressure has improved. Continue to monitor for now.  Tobacco abuse Patient was counseled. Nicotine patch.    DVT Prophylaxis: Lovenox    Code Status: Full code  Family Communication: Discussed with the patient Disposition Plan: Management as  outlined above.    LOS: 5 days   [facility] Pager [phone] 08/12/2016, 9:53 AM  If 7PM-7AM, please contact night-coverage at [email or web address], password [facility]

## 2016-08-12 NOTE — Progress Notes (Signed)
[facility] Rounding Note  Subjective:   Had renal biopsy 3/26 - no results as of yet Denies any back or flank pain or bloody urine Still has rather impressive anasarca Breathing/swelling no better (in reality worse - getting breathing TMT now) Changed IV lasix to po torsemide yesterday Weight is up 0.4 kg, UOP down past 2 days, even before the change from IV to po  Objective Vital signs in last 24 hours: Vitals:   08/11/16 2055 08/12/16 0331 08/12/16 0801 08/12/16 0854  BP: (!) 143/76 122/68  125/68  Pulse: 79   78  Resp: 18 18    Temp: 98.5 F (36.9 C) 98.8 F (37.1 C)    TempSrc: Oral Oral    SpO2: 96% 95% 93%   Weight:  86.4 kg (190 lb 7.6 oz)    Height:       Weight change: 0.353 kg (12.4 oz)  Intake/Output Summary (Last 24 hours) at 08/12/16 1315 Last data filed at 08/12/16 0331  Gross per 24 hour  Intake                0 ml  Output              700 ml  Net             -700 ml    Physical Exam: Very nice older AAF VS as noted + 6 cm JVD Bilat soft exp wheezes, few crackles esp L Abd no focal tenderness 2-3+ pitting edema to the hips   Recent Labs Lab 08/10/16 0204 08/11/16 0218 08/12/16 0423  NA 141 140 140  K 3.3* 3.7 3.7  CL 106 106 102  CO2 28 27 28   GLUCOSE 100* 98 110*  BUN 34* 32* 42*  CREATININE 1.22* 1.36* 1.87*  CALCIUM 7.8* 7.7* 7.8*  PHOS 5.0*  --  5.3*    Recent Labs Lab 08/07/16 1850 08/09/16 0132 08/10/16 0204 08/12/16 0423  AST 25 16  --   --   ALT 15 10*  --   --   ALKPHOS 84 58  --   --   BILITOT 0.5 0.1*  --   --   PROT 5.4* 4.6*  --   --   ALBUMIN 1.9* 2.3* 2.1* 1.8*    Recent Labs Lab 08/07/16 1850 08/08/16 0159 08/09/16 0132 08/10/16 0204 08/12/16 0422  WBC 7.0 6.5 6.4 5.5 6.4  NEUTROABS  --  4.2  --   --   --   HGB 13.9 13.0 10.4* 12.1 11.9*  HCT 40.8 39.0 31.1* 36.4 36.8  MCV 94.2 94.9 95.4 95.5 97.6  PLT 298 292 225 281 309    Recent Labs Lab 08/07/16 1850  TROPONINI <0.03    Recent Labs Lab  08/08/16 0623  GLUCAP 98    Recent Labs  08/10/16 0204  IRON 29  TIBC 115*  FERRITIN 206   Studies/Results: US Biopsy  Result Date: 08/11/2016 INDICATION: Necrotic center EXAM: ULTRASOUND-GUIDED BIOPSY RANDOM RENAL CORTEX CORE MEDICATIONS: None. ANESTHESIA/SEDATION: Fentanyl 50 mcg IV; Versed 1 mg IV Moderate Sedation Time:  10 The patient was continuously monitored during the procedure by the interventional radiology nurse under my direct supervision. FLUOROSCOPY TIME:  None. COMPLICATIONS: None immediate. PROCEDURE: Informed written consent was obtained from the patient after a thorough discussion of the procedural risks, benefits and alternatives. All questions were addressed. Maximal Sterile Barrier Technique was utilized including caps, mask, sterile gowns, sterile gloves, sterile drape, hand hygiene and skin antiseptic. A timeout was  performed prior to the initiation of the procedure. The back was prepped with ChloraPrep in a sterile fashion, and a sterile drape was applied covering the operative field. A sterile gown and sterile gloves were used for the procedure. Under sonographic guidance, 2 16 gauge core biopsies of the left lower pole renal cortex were obtained. Final imaging was performed. Patient tolerated the procedure well without complication. Vital sign monitoring by nursing staff during the procedure will continue as patient is in the special procedures unit for post procedure observation. FINDINGS: The images document guide needle placement within the left lower pole renal cortex. Post biopsy images demonstrate no hemorrhage. IMPRESSION: Successful ultrasound-guided core biopsy of the left lower pole renal cortex. Electronically Signed   By: Marybelle Killings M.D.   On: 08/11/2016 12:13    Scheduled Medications: . amLODipine  10 mg Oral Daily  . atorvastatin  20 mg Oral q1800  . azithromycin  250 mg Oral Daily  . enoxaparin (LOVENOX) injection  40 mg Subcutaneous Q24H  .  ipratropium-albuterol  3 mL Nebulization QID  . metolazone  5 mg Oral BID  . metoprolol tartrate  25 mg Oral BID  . nicotine  21 mg Transdermal Daily  . sodium chloride flush  3 mL Intravenous Q12H  . torsemide  100 mg Oral BID   Assessment/Plan:   71 year old black female with very little past medical history presenting with what appears to be nephrotic syndrome with some acute kidney injury. Outpt eval notable for neg HIV, neg ANA, Hep serologies all neg, UPC ?>20 gm proteinuria, UA with 6-10 RBC's. Kidneys 12.4 cm, normal echogenicity. SPEP no M-spike. Renal bx done 08/11/16  1. Renal- normal creatinine in August 2017, creatinine of 1.3 1 week PTA, and since hospitalization - creatinines:  1.9->1.6->1.22->1.36->1.8. Nephrotic range by UPC - >20 gm (w/ low albumin). Diagnostic renal biopsy was done 3/26. In the DDX - membranous (can be assoc w/malig - no PAP or mammo in some time, due for colonoscopy next month, CXR neg), adult minimal change, FSG. Hope for prelim result in next 24 hours from the light microscopy 2. Hypertension/volume  - volume overloaded in the setting of nephrotic syndrome. Changed from IV lasix to po torsemide yesterday. UOP not great. Add metolazone 5 mg BID. Anticipate there will be further worsening of kidney function if we are able to adequately diurese her... 3. Anemia  - not an issue at this time and Hb stable post renal bx.   Jamal Maes, MD [facility] [phone] Pager 08/12/2016, 1:15 PM

## 2016-08-13 DIAGNOSIS — R140 Abdominal distension (gaseous): Secondary | ICD-10-CM

## 2016-08-13 DIAGNOSIS — R05 Cough: Secondary | ICD-10-CM

## 2016-08-13 DIAGNOSIS — R0609 Other forms of dyspnea: Secondary | ICD-10-CM

## 2016-08-13 LAB — BASIC METABOLIC PANEL
Anion gap: 10 (ref 5–15)
BUN: 52 mg/dL — AB (ref 6–20)
CALCIUM: 7.7 mg/dL — AB (ref 8.9–10.3)
CO2: 27 mmol/L (ref 22–32)
Chloride: 103 mmol/L (ref 101–111)
Creatinine, Ser: 2.07 mg/dL — ABNORMAL HIGH (ref 0.44–1.00)
GFR calc Af Amer: 27 mL/min — ABNORMAL LOW (ref >60)
GFR, EST NON AFRICAN AMERICAN: 23 mL/min — AB (ref >60)
GLUCOSE: 105 mg/dL — AB (ref 65–99)
POTASSIUM: 3.8 mmol/L (ref 3.5–5.1)
Sodium: 140 mmol/L (ref 135–145)

## 2016-08-13 LAB — CBC
HEMATOCRIT: 33.7 % — AB (ref 36.0–46.0)
Hemoglobin: 11 g/dL — ABNORMAL LOW (ref 12.0–15.0)
MCH: 31.3 pg (ref 26.0–34.0)
MCHC: 32.6 g/dL (ref 30.0–36.0)
MCV: 95.7 fL (ref 78.0–100.0)
PLATELETS: 313 10*3/uL (ref 150–400)
RBC: 3.52 MIL/uL — ABNORMAL LOW (ref 3.87–5.11)
RDW: 13.5 % (ref 11.5–15.5)
WBC: 7.2 10*3/uL (ref 4.0–10.5)

## 2016-08-13 MED ORDER — DEXTROSE 5 % IV SOLN
160.0000 mg | Freq: Four times a day (QID) | INTRAVENOUS | Status: DC
  Administered 2016-08-13 – 2016-08-15 (×8): 160 mg via INTRAVENOUS
  Filled 2016-08-13 (×10): qty 16

## 2016-08-13 MED ORDER — METOLAZONE 5 MG PO TABS
10.0000 mg | ORAL_TABLET | Freq: Two times a day (BID) | ORAL | Status: DC
  Administered 2016-08-13 – 2016-08-15 (×4): 10 mg via ORAL
  Filled 2016-08-13 (×3): qty 2
  Filled 2016-08-13 (×2): qty 4

## 2016-08-13 MED ORDER — SODIUM CHLORIDE 0.9 % IV SOLN
500.0000 mg | INTRAVENOUS | Status: DC
  Administered 2016-08-13: 500 mg via INTRAVENOUS
  Filled 2016-08-13 (×2): qty 4

## 2016-08-13 NOTE — Progress Notes (Addendum)
[facility] Rounding Note  Subjective:   Had renal biopsy 3/26 - no results as of yet Denies any back or flank pain or bloody urine Poor diuretic response to torsemide + metolazone No bx results yet (I have called)  Objective Vital signs in last 24 hours: Vitals:   08/13/16 0522 08/13/16 0824 08/13/16 0855 08/13/16 1129  BP: 125/70  136/71   Pulse: 72  81   Resp: 20     Temp: 99.1 F (37.3 C)     TempSrc: Oral     SpO2: 100% 94%  93%  Weight: 87.5 kg (193 lb)     Height:       Weight change: 1.144 kg (2 lb 8.4 oz)  Intake/Output Summary (Last 24 hours) at 08/13/16 1243 Last data filed at 08/13/16 0830  Gross per 24 hour  Intake              240 ml  Output              100 ml  Net              140 ml    Physical Exam: Very nice older AAF VS as noted + 6 cm JVD Bilat soft exp wheezes, few crackles esp L Abd no focal tenderness 2-3+ pitting edema to the hips   Recent Labs Lab 08/10/16 0204 08/11/16 0218 08/12/16 0423 08/13/16 0237  NA 141 140 140 140  K 3.3* 3.7 3.7 3.8  CL 106 106 102 103  CO2 28 27 28 27   GLUCOSE 100* 98 110* 105*  BUN 34* 32* 42* 52*  CREATININE 1.22* 1.36* 1.87* 2.07*  CALCIUM 7.8* 7.7* 7.8* 7.7*  PHOS 5.0*  --  5.3*  --     Recent Labs Lab 08/07/16 1850 08/09/16 0132 08/10/16 0204 08/12/16 0423  AST 25 16  --   --   ALT 15 10*  --   --   ALKPHOS 84 58  --   --   BILITOT 0.5 0.1*  --   --   PROT 5.4* 4.6*  --   --   ALBUMIN 1.9* 2.3* 2.1* 1.8*    Recent Labs Lab 08/08/16 0159 08/09/16 0132 08/10/16 0204 08/12/16 0422 08/13/16 0237  WBC 6.5 6.4 5.5 6.4 7.2  NEUTROABS 4.2  --   --   --   --   HGB 13.0 10.4* 12.1 11.9* 11.0*  HCT 39.0 31.1* 36.4 36.8 33.7*  MCV 94.9 95.4 95.5 97.6 95.7  PLT 292 225 281 309 313    Recent Labs Lab 08/07/16 1850  TROPONINI <0.03    Recent Labs Lab 08/08/16 0623  GLUCAP 98    Scheduled Medications: . amLODipine  10 mg Oral Daily  . atorvastatin  20 mg Oral q1800  . azithromycin   250 mg Oral Daily  . enoxaparin (LOVENOX) injection  40 mg Subcutaneous Q24H  . ipratropium-albuterol  3 mL Nebulization QID  . metolazone  5 mg Oral BID  . metoprolol tartrate  25 mg Oral BID  . nicotine  21 mg Transdermal Daily  . sodium chloride flush  3 mL Intravenous Q12H  . torsemide  100 mg Oral BID   Assessment/Plan:   71 year old black female with very little past medical history presenting with what appears to be nephrotic syndrome with some acute kidney injury. Outpt eval notable for neg HIV, neg ANA, Hep serologies all neg, UPC ?>20 gm proteinuria, UA with 6-10 RBC's. Kidneys 12.4 cm, normal echogenicity. SPEP  no M-spike. Renal bx done 08/11/16  1. Nephrotic syndrome with acute kidney injury 1. Normal creatinine in August 2017, creatinine of 1.3 1 week PTA, and since hospitalization - creatinines:  1.9->1.6->1.22->1.36->1.8->2.  2. Nephrotic range proteinuria by UPC - >20 gm (w/ low albumin).  3. Diagnostic renal biopsy done 3/26 - still no results (I called) 4. In the DDX - membranous (can be assoc w/malig - no PAP or mammo in some time, due for colonoscopy next month, CXR neg), adult minimal change, FSG.  5. With continued worsening of renal function and no dx yet will start empiric high dose steroids 2. Hypertension/volume  1. Poor response to torsemide + metolazone 2. Change back to IV lasix 3. Increase metolazone to 10 mg BID 4. If renal function continues to worsen and/or remains diuretic unresponsive will have no option other that proceeding with HD  3. Anemia  - not an issue at this time and Hb stable post renal bx.   Jamal Maes, MD [facility] [phone] Pager 08/13/2016, 12:43 PM

## 2016-08-13 NOTE — Care Management Note (Signed)
Case Management Note Marvetta Gibbons RN, BSN Unit 2W-Case Manager [phone]  Patient Details  Name: Mary Chapman MRN: [id number] Date of Birth: [birth year]  Subjective/Objective:  Pt admitted with anasarca, nephrotic syndrome- s/p renal bx                  Action/Plan: PTA pt lived at home- independent, plan to return home- CM to follow  Expected Discharge Date:                  Expected Discharge Plan:  Home/Self Care  In-House Referral:     Discharge planning Services  CM Consult  Post Acute Care Choice:  NA Choice offered to:  NA  DME Arranged:    DME Agency:     HH Arranged:    [place] Agency:     Status of Service:  In process, will continue to follow  If discussed at Long Length of Stay Meetings, dates discussed:    Additional Comments:  Dawayne Patricia, RN 08/13/2016, 12:13 PM

## 2016-08-13 NOTE — Progress Notes (Signed)
[facility] PROGRESS NOTE  Mary Chapman TDH:[id number] DOB: [birth year] DOA: 08/07/2016  PCP: [facility]  Subjective/Interval History: Denies shortness of breath, reported not much of urine output this morning but this before she was taken her Demadex.  Urine output charted only had 100 mL/yesterday, weight increased from 190 yesterday to 193 pounds  Brief History/Interval Summary: 71 year old African-American female with a past medical history of hypertension who presented with complaints of shortness of breath and leg swelling. Her symptoms started about a week ago with leg swelling. She was given diuretics by her primary care provider with no improvement. She subsequently decided to come into the hospital. She was found to have elevated creatinine. She was hospitalized for further management.  Reason for Visit: Nephrotic syndrome  Consultants: Nephrology. Interventional radiology.  Procedures:   Transthoracic echocardiogram Study Conclusions  - Left ventricle: The cavity size was normal. Systolic function was   vigorous. The estimated ejection fraction was in the range of 65%   to 70%. Wall motion was normal; there were no regional wall   motion abnormalities. Doppler parameters are consistent with   abnormal left ventricular relaxation (grade 1 diastolic   dysfunction). There was no evidence of elevated ventricular   filling pressure by Doppler parameters. - Aortic valve: There was no regurgitation. - Aortic root: The aortic root was normal in size. - Mitral valve: There was mild regurgitation. - Right ventricle: Systolic function was normal. - Right atrium: The atrium was normal in size. - Tricuspid valve: There was mild regurgitation. - Pulmonic valve: There was no regurgitation. - Pulmonary arteries: Systolic pressure was within the normal   range. - Inferior vena cava: The vessel was dilated. The respirophasic   diameter changes were blunted (< 50%),  consistent with elevated   central venous pressure. - Pericardium, extracardiac: There was no pericardial effusion.  Impressions: - Consider non-cardiac causes for anasarca and dyspnea.  Renal biopsy 3/26  Antibiotics: None   ROS: Denies any nausea or vomiting.  Objective:  Vital Signs  Vitals:   08/12/16 2117 08/13/16 0522 08/13/16 0824 08/13/16 0855  BP:  125/70  136/71  Pulse:  72  81  Resp:  20    Temp:  99.1 F (37.3 C)    TempSrc:  Oral    SpO2: 95% 100% 94%   Weight:  87.5 kg (193 lb)    Height:        Intake/Output Summary (Last 24 hours) at 08/13/16 0954 Last data filed at 08/13/16 0830  Gross per 24 hour  Intake              240 ml  Output              100 ml  Net              140 ml   Filed Weights   08/11/16 0523 08/12/16 0331 08/13/16 0522  Weight: 86 kg (189 lb 11.2 oz) 86.4 kg (190 lb 7.6 oz) 87.5 kg (193 lb)    General appearance: alert, cooperative, appears stated age and no distress Resp: Worsening breath sounds today. Few crackles at the bases. Mild tachypnea.   Cardio: regular rate and rhythm, S1, S2 normal, no murmur, click, rub or gallop GI: soft, non-tender; bowel sounds normal; no masses,  no organomegaly Extremities: Pitting edema noted bilateral lower extremities. Hasn't had any significant improvement over the last 2 days. Neurologic: No focal deficits.  Lab Results:  Data Reviewed: I have personally reviewed  following labs and imaging studies  CBC:  Recent Labs Lab 08/08/16 0159 08/09/16 0132 08/10/16 0204 08/12/16 0422 08/13/16 0237  WBC 6.5 6.4 5.5 6.4 7.2  NEUTROABS 4.2  --   --   --   --   HGB 13.0 10.4* 12.1 11.9* 11.0*  HCT 39.0 31.1* 36.4 36.8 33.7*  MCV 94.9 95.4 95.5 97.6 95.7  PLT 292 225 281 309 166    Basic Metabolic Panel:  Recent Labs Lab 08/09/16 0132 08/10/16 0204 Sep 07, 2016 0218 08/12/16 0423 08/13/16 0237  NA 138 141 140 140 140  K 3.7 3.3* 3.7 3.7 3.8  CL 107 106 106 102 103  CO2 25 28 27  28 27   GLUCOSE 104* 100* 98 110* 105*  BUN 43* 34* 32* 42* 52*  CREATININE 1.68* 1.22* 1.36* 1.87* 2.07*  CALCIUM 7.6* 7.8* 7.7* 7.8* 7.7*  PHOS  --  5.0*  --  5.3*  --     GFR: Estimated Creatinine Clearance: 28.2 mL/min (A) (by C-G formula based on SCr of 2.07 mg/dL (H)).  Liver Function Tests:  Recent Labs Lab 08/07/16 1850 08/09/16 0132 08/10/16 0204 08/12/16 0423  AST 25 16  --   --   ALT 15 10*  --   --   ALKPHOS 84 58  --   --   BILITOT 0.5 0.1*  --   --   PROT 5.4* 4.6*  --   --   ALBUMIN 1.9* 2.3* 2.1* 1.8*    Coagulation Profile:  Recent Labs Lab 08/08/16 0159  INR 1.07    Cardiac Enzymes:  Recent Labs Lab 08/07/16 1850  TROPONINI <0.03    CBG:  Recent Labs Lab 08/08/16 0623  GLUCAP 98    Lipid Profile: No results for input(s): CHOL, HDL, LDLCALC, TRIG, CHOLHDL, LDLDIRECT in the last 72 hours.   Recent Results (from the past 240 hour(s))  Culture, expectorated sputum-assessment     Status: None   Collection Time: 08/08/16  4:02 AM  Result Value Ref Range Status   Specimen Description EXPECTORATED SPUTUM  Final   Special Requests NONE  Final   Sputum evaluation THIS SPECIMEN IS ACCEPTABLE FOR SPUTUM CULTURE  Final   Report Status 08/08/2016 FINAL  Final  Culture, respiratory (NON-Expectorated)     Status: None   Collection Time: 08/08/16  4:02 AM  Result Value Ref Range Status   Specimen Description EXPECTORATED SPUTUM  Final   Special Requests NONE Reflexed from [id number]  Final   Gram Stain   Final    FEW SQUAMOUS EPITHELIAL CELLS PRESENT FEW WBC PRESENT, PREDOMINANTLY MONONUCLEAR RARE GRAM POSITIVE COCCI IN PAIRS    Culture Consistent with normal respiratory flora.  Final   Report Status 08/10/2016 FINAL  Final      Radiology Studies: US Biopsy  Result Date: 07-Sep-2016 INDICATION: Necrotic center EXAM: ULTRASOUND-GUIDED BIOPSY RANDOM RENAL CORTEX CORE MEDICATIONS: None. ANESTHESIA/SEDATION: Fentanyl 50 mcg IV; Versed 1 mg IV  Moderate Sedation Time:  10 The patient was continuously monitored during the procedure by the interventional radiology nurse under my direct supervision. FLUOROSCOPY TIME:  None. COMPLICATIONS: None immediate. PROCEDURE: Informed written consent was obtained from the patient after a thorough discussion of the procedural risks, benefits and alternatives. All questions were addressed. Maximal Sterile Barrier Technique was utilized including caps, mask, sterile gowns, sterile gloves, sterile drape, hand hygiene and skin antiseptic. A timeout was performed prior to the initiation of the procedure. The back was prepped with ChloraPrep in a sterile fashion, and  a sterile drape was applied covering the operative field. A sterile gown and sterile gloves were used for the procedure. Under sonographic guidance, 2 16 gauge core biopsies of the left lower pole renal cortex were obtained. Final imaging was performed. Patient tolerated the procedure well without complication. Vital sign monitoring by nursing staff during the procedure will continue as patient is in the special procedures unit for post procedure observation. FINDINGS: The images document guide needle placement within the left lower pole renal cortex. Post biopsy images demonstrate no hemorrhage. IMPRESSION: Successful ultrasound-guided core biopsy of the left lower pole renal cortex. Electronically Signed   By: Marybelle Killings M.D.   On: 08/11/2016 12:13     Medications:  Scheduled: . amLODipine  10 mg Oral Daily  . atorvastatin  20 mg Oral q1800  . azithromycin  250 mg Oral Daily  . enoxaparin (LOVENOX) injection  40 mg Subcutaneous Q24H  . ipratropium-albuterol  3 mL Nebulization QID  . metolazone  5 mg Oral BID  . metoprolol tartrate  25 mg Oral BID  . nicotine  21 mg Transdermal Daily  . sodium chloride flush  3 mL Intravenous Q12H  . torsemide  100 mg Oral BID   Continuous:  [id number] **OR** acetaminophen, albuterol, hydrALAZINE,  ondansetron **OR** ondansetron (ZOFRAN) IV, zolpidem  Assessment/Plan:  Principal Problem:   Anasarca Active Problems:   Tobacco abuse   Essential hypertension   AKI (acute kidney injury) ([place])   Dyspnea    Anasarca Secondary to nephrotic syndrome Patient's echocardiogram shows normal systolic function. LFTs are normal. No history of liver disease. No history of alcohol intake. UA does show significant proteinuria. She was on high dose of Lasix, switched to 100 mg of Demadex, urine output dropped, weight increased. Metolazone added on 3/27 without much of urine output. Likely needs higher dose of diuretics, await nephrology recommendation.  AKI (acute kidney injury)/nephrotic syndrome No previously known history of kidney disease. She does have a history of hypertension but is on only one medication at home. Followed regularly by her primary care provider. Patient underwent renal biopsy on 3/26. Her results are pending. Currently on torsemide and metolazone. Nephrology is following. Rise in creatinine noted. SPEP does not show any monoclonal protein.  Biopsy results pending, await nephrology recommendations.  Dyspnea Dyspnea was likely due to fluid overload. Chest x-ray did not show any pulmonary edema or infiltrates. She was given diuretics. Dyspnea has improved slightly. She was given ceftriaxone and azithromycin in the emergency Department. WBC is normal. She was noted to have low-grade fever. Could have some form of upper respiratory tract infection. Currently on oral azithromycin. She likely has an element of chronic lung disease considering history of smoking.  Essential hypertension  Continue amlodipine and metoprolol. Blood pressure has improved. Continue to monitor for now.  Tobacco abuse Patient was counseled. Nicotine patch.    DVT Prophylaxis: Lovenox    Code Status: Full code  Family Communication: Discussed with the patient Disposition Plan: Management as  outlined above.    LOS: 6 days   [facility] Pager [phone] 08/13/2016, 9:54 AM  If 7PM-7AM, please contact night-coverage at [email or web address], password [email or web address]

## 2016-08-14 ENCOUNTER — Encounter (HOSPITAL_COMMUNITY): Payer: Self-pay | Admitting: General Surgery

## 2016-08-14 LAB — RENAL FUNCTION PANEL
ANION GAP: 12 (ref 5–15)
Albumin: 1.6 g/dL — ABNORMAL LOW (ref 3.5–5.0)
BUN: 60 mg/dL — ABNORMAL HIGH (ref 6–20)
CO2: 24 mmol/L (ref 22–32)
Calcium: 7.9 mg/dL — ABNORMAL LOW (ref 8.9–10.3)
Chloride: 102 mmol/L (ref 101–111)
Creatinine, Ser: 2.73 mg/dL — ABNORMAL HIGH (ref 0.44–1.00)
GFR calc non Af Amer: 17 mL/min — ABNORMAL LOW (ref >60)
GFR, EST AFRICAN AMERICAN: 19 mL/min — AB (ref >60)
GLUCOSE: 212 mg/dL — AB (ref 65–99)
Phosphorus: 6.1 mg/dL — ABNORMAL HIGH (ref 2.5–4.6)
Potassium: 3.4 mmol/L — ABNORMAL LOW (ref 3.5–5.1)
Sodium: 138 mmol/L (ref 135–145)

## 2016-08-14 MED ORDER — PREDNISONE 20 MG PO TABS
60.0000 mg | ORAL_TABLET | Freq: Every day | ORAL | Status: DC
  Administered 2016-08-14 – 2016-08-20 (×7): 60 mg via ORAL
  Filled 2016-08-14 (×8): qty 3

## 2016-08-14 MED ORDER — IPRATROPIUM-ALBUTEROL 0.5-2.5 (3) MG/3ML IN SOLN
3.0000 mL | Freq: Three times a day (TID) | RESPIRATORY_TRACT | Status: DC
  Administered 2016-08-14 – 2016-08-15 (×3): 3 mL via RESPIRATORY_TRACT
  Filled 2016-08-14 (×3): qty 3

## 2016-08-14 MED ORDER — TACROLIMUS 1 MG PO CAPS
3.0000 mg | ORAL_CAPSULE | Freq: Two times a day (BID) | ORAL | Status: DC
  Administered 2016-08-14 – 2016-08-20 (×12): 3 mg via ORAL
  Filled 2016-08-14 (×12): qty 3

## 2016-08-14 MED ORDER — ENOXAPARIN SODIUM 30 MG/0.3ML ~~LOC~~ SOLN: 30.0000 mg | SUBCUTANEOUS | Status: DC

## 2016-08-14 NOTE — Care Management Important Message (Signed)
Important Message  Patient Details  Name: Mary Chapman MRN: [id number] Date of Birth: [birth year]   Medicare Important Message Given:  Yes    Nathen May 08/14/2016, 3:27 PM

## 2016-08-14 NOTE — Progress Notes (Signed)
[facility] PROGRESS NOTE  Mary Chapman ZOX:[id number] DOB: 1945-06-11 DOA: 08/07/2016  PCP: [facility]  Subjective/Interval History: I appreciate Dr. Lorrene Reid held, appears to be collapsing FSGS, doses of diuretics. Continues to have swelling, complained about hand swelling today.  Brief History/Interval Summary: 71 year old African-American female with a past medical history of hypertension who presented with complaints of shortness of breath and leg swelling. Her symptoms started about a week ago with leg swelling. She was given diuretics by her primary care provider with no improvement. She subsequently decided to come into the hospital. She was found to have elevated creatinine. She was hospitalized for further management.  Reason for Visit: Nephrotic syndrome  Consultants: Nephrology. Interventional radiology.  Procedures:   Transthoracic echocardiogram Study Conclusions  - Left ventricle: The cavity size was normal. Systolic function was   vigorous. The estimated ejection fraction was in the range of 65%   to 70%. Wall motion was normal; there were no regional wall   motion abnormalities. Doppler parameters are consistent with   abnormal left ventricular relaxation (grade 1 diastolic   dysfunction). There was no evidence of elevated ventricular   filling pressure by Doppler parameters. - Aortic valve: There was no regurgitation. - Aortic root: The aortic root was normal in size. - Mitral valve: There was mild regurgitation. - Right ventricle: Systolic function was normal. - Right atrium: The atrium was normal in size. - Tricuspid valve: There was mild regurgitation. - Pulmonic valve: There was no regurgitation. - Pulmonary arteries: Systolic pressure was within the normal   range. - Inferior vena cava: The vessel was dilated. The respirophasic   diameter changes were blunted (< 50%), consistent with elevated   central venous pressure. - Pericardium,  extracardiac: There was no pericardial effusion.  Impressions: - Consider non-cardiac causes for anasarca and dyspnea.  Renal biopsy 3/26  Antibiotics: None   ROS: Denies any nausea or vomiting.  Objective:  Vital Signs  Vitals:   08/13/16 2048 08/14/16 0300 08/14/16 0845 08/14/16 0932  BP:  135/81  131/78  Pulse: 78 78    Resp: 18 18    Temp:  97.7 F (36.5 C)    TempSrc:  Oral    SpO2: 96% 98% 95%   Weight:  88.2 kg (194 lb 7.1 oz)    Height:        Intake/Output Summary (Last 24 hours) at 08/14/16 1056 Last data filed at 08/14/16 0900  Gross per 24 hour  Intake              918 ml  Output              450 ml  Net              468 ml   Filed Weights   08/12/16 0331 08/13/16 0522 08/14/16 0300  Weight: 86.4 kg (190 lb 7.6 oz) 87.5 kg (193 lb) 88.2 kg (194 lb 7.1 oz)    General appearance: alert, cooperative, appears stated age and no distress Resp: Worsening breath sounds today. Few crackles at the bases. Mild tachypnea.   Cardio: regular rate and rhythm, S1, S2 normal, no murmur, click, rub or gallop GI: soft, non-tender; bowel sounds normal; no masses,  no organomegaly Extremities: Pitting edema noted bilateral lower extremities. Hasn't had any significant improvement over the last 2 days. Neurologic: No focal deficits.  Lab Results:  Data Reviewed: I have personally reviewed following labs and imaging studies  CBC:  Recent Labs Lab 08/08/16  2725 08/09/16 0132 08/10/16 0204 08/12/16 0422 08/13/16 0237  WBC 6.5 6.4 5.5 6.4 7.2  NEUTROABS 4.2  --   --   --   --   HGB 13.0 10.4* 12.1 11.9* 11.0*  HCT 39.0 31.1* 36.4 36.8 33.7*  MCV 94.9 95.4 95.5 97.6 95.7  PLT 292 225 281 309 366    Basic Metabolic Panel:  Recent Labs Lab 08/10/16 0204 08/11/16 0218 08/12/16 0423 08/13/16 0237 08/14/16 0313  NA 141 140 140 140 138  K 3.3* 3.7 3.7 3.8 3.4*  CL 106 106 102 103 102  CO2 28 27 28 27 24   GLUCOSE 100* 98 110* 105* 212*  BUN 34* 32* 42*  52* 60*  CREATININE 1.22* 1.36* 1.87* 2.07* 2.73*  CALCIUM 7.8* 7.7* 7.8* 7.7* 7.9*  PHOS 5.0*  --  5.3*  --  6.1*    GFR: Estimated Creatinine Clearance: 21.5 mL/min (A) (by C-G formula based on SCr of 2.73 mg/dL (H)).  Liver Function Tests:  Recent Labs Lab 08/07/16 1850 08/09/16 0132 08/10/16 0204 08/12/16 0423 08/14/16 0313  AST 25 16  --   --   --   ALT 15 10*  --   --   --   ALKPHOS 84 58  --   --   --   BILITOT 0.5 0.1*  --   --   --   PROT 5.4* 4.6*  --   --   --   ALBUMIN 1.9* 2.3* 2.1* 1.8* 1.6*    Coagulation Profile:  Recent Labs Lab 08/08/16 0159  INR 1.07    Cardiac Enzymes:  Recent Labs Lab 08/07/16 1850  TROPONINI <0.03    CBG:  Recent Labs Lab 08/08/16 0623  GLUCAP 98    Lipid Profile: No results for input(s): CHOL, HDL, LDLCALC, TRIG, CHOLHDL, LDLDIRECT in the last 72 hours.   Recent Results (from the past 240 hour(s))  Culture, expectorated sputum-assessment     Status: None   Collection Time: 08/08/16  4:02 AM  Result Value Ref Range Status   Specimen Description EXPECTORATED SPUTUM  Final   Special Requests NONE  Final   Sputum evaluation THIS SPECIMEN IS ACCEPTABLE FOR SPUTUM CULTURE  Final   Report Status 08/08/2016 FINAL  Final  Culture, respiratory (NON-Expectorated)     Status: None   Collection Time: 08/08/16  4:02 AM  Result Value Ref Range Status   Specimen Description EXPECTORATED SPUTUM  Final   Special Requests NONE Reflexed from [id number]  Final   Gram Stain   Final    FEW SQUAMOUS EPITHELIAL CELLS PRESENT FEW WBC PRESENT, PREDOMINANTLY MONONUCLEAR RARE GRAM POSITIVE COCCI IN PAIRS    Culture Consistent with normal respiratory flora.  Final   Report Status 08/10/2016 FINAL  Final      Radiology Studies: No results found.   Medications:  Scheduled: . amLODipine  10 mg Oral Daily  . atorvastatin  20 mg Oral q1800  . azithromycin  250 mg Oral Daily  . furosemide  160 mg Intravenous Q6H  .  ipratropium-albuterol  3 mL Nebulization QID  . metolazone  10 mg Oral BID  . metoprolol tartrate  25 mg Oral BID  . nicotine  21 mg Transdermal Daily  . predniSONE  60 mg Oral Q breakfast  . sodium chloride flush  3 mL Intravenous Q12H  . tacrolimus  3 mg Oral BID   Continuous:  QQV:[id number] **OR** acetaminophen, albuterol, hydrALAZINE, ondansetron **OR** ondansetron (ZOFRAN) IV, zolpidem  Assessment/Plan:  Principal  Problem:   Anasarca Active Problems:   Tobacco abuse   Essential hypertension   AKI (acute kidney injury) ([place])   Dyspnea    Anasarca Secondary to nephrotic syndrome Patient's echocardiogram shows normal systolic function. LFTs are normal. No history of liver disease. No history of alcohol intake. UA does show significant proteinuria. She was on high dose of Lasix, switched to 100 mg of Demadex, urine output dropped, weight increased. On high dose of Lasix, volume is not controlled despite high doses, per nephrology will need HD. She will be switched to prednisone, started on Prograf  AKI (acute kidney injury)/nephrotic syndrome No previously known history of kidney disease. She does have a history of hypertension but is on only one medication at home. Followed regularly by her primary care provider. Patient underwent renal biopsy on 3/26. Her results are pending. Currently on torsemide and metolazone. Nephrology is following. Rise in creatinine noted. SPEP does not show any monoclonal protein.  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 Chest x-ray did not show any pulmonary edema or infiltrates. She was given diuretics. Dyspnea has improved slightly. She was given ceftriaxone and azithromycin in the emergency Department. WBC is normal. She was noted to have low-grade fever. Could have some form of upper respiratory tract infection.  Currently on oral azithromycin. She likely has an element of  chronic lung disease considering history of smoking.  Essential hypertension  Continue amlodipine and metoprolol. Blood pressure has improved. Continue to monitor for now.  Tobacco abuse Patient was counseled. Nicotine patch.    DVT Prophylaxis: Lovenox    Code Status: Full code  Family Communication: Discussed with the patient Disposition Plan: Management as outlined above.    LOS: 7 days   [facility]  [facility] Pager [phone] 08/14/2016, 10:56 AM  If 7PM-7AM, please contact night-coverage at [email or web address], password [facility]

## 2016-08-14 NOTE — Progress Notes (Addendum)
[facility] Rounding Note  Subjective:   Preliminary on renal biopsy is collapsing FSGS (very aggressive, no proven treatments) Remains diuretic unresponsive (UOP 100 cc) and creatinine rapidly rising Remains SOB, edematous, weight continues to go up despite ceiling doses of diuretics  Objective Vital signs in last 24 hours: Vitals:   08/13/16 2048 08/14/16 0300 08/14/16 0845 08/14/16 0932  BP:  135/81  131/78  Pulse: 78 78    Resp: 18 18    Temp:  97.7 F (36.5 C)    TempSrc:  Oral    SpO2: 96% 98% 95%   Weight:  88.2 kg (194 lb 7.1 oz)    Height:       Weight change: 0.656 kg (1 lb 7.1 oz)  Intake/Output Summary (Last 24 hours) at 08/14/16 1025 Last data filed at 08/14/16 0300  Gross per 24 hour  Intake              678 ml  Output              200 ml  Net              478 ml    Physical Exam: Very nice older AAF VS as noted + 6 cm JVD Bilat soft exp wheezes, few crackles esp L Abd no focal tenderness 2-3+ pitting edema to the hips   Recent Labs Lab 08/10/16 0204  08/12/16 0423 08/13/16 0237 08/14/16 0313  NA 141  < > 140 140 138  K 3.3*  < > 3.7 3.8 3.4*  CL 106  < > 102 103 102  CO2 28  < > 28 27 24   GLUCOSE 100*  < > 110* 105* 212*  BUN 34*  < > 42* 52* 60*  CREATININE 1.22*  < > 1.87* 2.07* 2.73*  CALCIUM 7.8*  < > 7.8* 7.7* 7.9*  PHOS 5.0*  --  5.3*  --  6.1*  < > = values in this interval not displayed.  Recent Labs Lab 08/07/16 1850 08/09/16 0132 08/10/16 0204 08/12/16 0423 08/14/16 0313  AST 25 16  --   --   --   ALT 15 10*  --   --   --   ALKPHOS 84 58  --   --   --   BILITOT 0.5 0.1*  --   --   --   PROT 5.4* 4.6*  --   --   --   ALBUMIN 1.9* 2.3* 2.1* 1.8* 1.6*    Recent Labs Lab 08/08/16 0159 08/09/16 0132 08/10/16 0204 08/12/16 0422 08/13/16 0237  WBC 6.5 6.4 5.5 6.4 7.2  NEUTROABS 4.2  --   --   --   --   HGB 13.0 10.4* 12.1 11.9* 11.0*  HCT 39.0 31.1* 36.4 36.8 33.7*  MCV 94.9 95.4 95.5 97.6 95.7  PLT 292 225 281 309 313     Recent Labs Lab 08/07/16 1850  TROPONINI <0.03    Recent Labs Lab 08/08/16 0623  GLUCAP 98    Scheduled Medications: . amLODipine  10 mg Oral Daily  . atorvastatin  20 mg Oral q1800  . azithromycin  250 mg Oral Daily  . [START ON 08/16/2016] enoxaparin (LOVENOX) injection  30 mg Subcutaneous Q24H  . furosemide  160 mg Intravenous Q6H  . ipratropium-albuterol  3 mL Nebulization QID  . methylPREDNISolone (SOLU-MEDROL) injection  500 mg Intravenous Q24H  . metolazone  10 mg Oral BID  . metoprolol tartrate  25 mg Oral BID  .  nicotine  21 mg Transdermal Daily  . sodium chloride flush  3 mL Intravenous Q12H   Assessment/Plan:   71 year old black female with very little past medical history presenting with what appears to be nephrotic syndrome with some acute kidney injury. Outpt eval notable for neg HIV, neg ANA, Hep serologies all neg, UPC ?>20 gm proteinuria, UA with 6-10 RBC's. Kidneys 12.4 cm, normal echogenicity. SPEP no M-spike. Renal bx done 08/11/16  1. Nephrotic syndrome with acute kidney injury 2/2 biopsy proven collapsing FSGS which is very aggressive and there are no proven treatments (some random case reports of successful rx with steroids, prograf) 1. She is losing GFR rapidly and has completely failed ceiling doses of diuretics. 2. Will need to move ahead with dialysis 3. For [facility] in IR tomorrow and then initiate dialysis  4. Can try and add prograf but at least for now will need HD to control volume and progressive renal failure  5. Keep diuretics on until HD starts 6. Stop solumedrol, use prednisone 60mg /day and add prograf 3 mg BID (sl less that the 0.1 mg/kg dosing recommended in some case reports - b/c I think her EDW is much lower than actual - so as not to overdo it) 7. Trial of prograf  2. Hypertension/volume - no response at all to ceiling dose diuretics 3. Anemia  - not an issue at this time and Hb stable post renal bx. 4. DVT prophylaxis - stop Lovenox  (for procedure, and b/c just had renal bx so bleeding risk). SCD's   Jamal Maes, MD [place] [phone] Pager 08/14/2016, 10:25 AM

## 2016-08-14 NOTE — Progress Notes (Signed)
Patient ID: Mary Chapman, female   DOB: 1945/10/27, 71 y.o.   MRN: [id number]    Referring Physician(s): Dr. Jamal Maes  Supervising Physician: Corrie Mckusick  Patient Status: [facility] - In-pt  Chief Complaint: Renal failure  Subjective: Patient is known to IR service recently for a renal biopsy.  She presented with some leg swelling and SOB.  She was given diuretics with no improvement.  She was ultimately admitted.  She underwent a renal biopsy of which pathology is still pending.  Her urine output has not responded to diuretic therapy and her creatinine remains relatively unchanged.  Renal has requested a tunneled HD cath for possible HD if she does not respond soon to current therapies.   Allergies: Patient has no known allergies.  Medications: Prior to Admission medications   Medication Sig Start Date End Date Taking? Authorizing Provider  furosemide (LASIX) 40 MG tablet Take 40 mg by mouth 2 (two) times daily.    Yes Historical Provider, MD  hydrochlorothiazide (HYDRODIURIL) 25 MG tablet Take 25 mg by mouth daily.   Yes Historical Provider, MD    Vital Signs: BP 131/78   Pulse 78   Temp 97.7 F (36.5 C) (Oral)   Resp 18   Ht 5\' 6"  (1.676 m)   Wt 194 lb 7.1 oz (88.2 kg)   SpO2 95%   BMI 31.38 kg/m   Physical Exam: Gen: Pleasant, NAD Heart: regular LungS: CTAB Abd: soft, Nt, ND, +BS  Imaging: US Biopsy  Result Date: 08/11/2016 INDICATION: Necrotic center EXAM: ULTRASOUND-GUIDED BIOPSY RANDOM RENAL CORTEX CORE MEDICATIONS: None. ANESTHESIA/SEDATION: Fentanyl 50 mcg IV; Versed 1 mg IV Moderate Sedation Time:  10 The patient was continuously monitored during the procedure by the interventional radiology nurse under my direct supervision. FLUOROSCOPY TIME:  None. COMPLICATIONS: None immediate. PROCEDURE: Informed written consent was obtained from the patient after a thorough discussion of the procedural risks, benefits and alternatives. All questions were addressed.  Maximal Sterile Barrier Technique was utilized including caps, mask, sterile gowns, sterile gloves, sterile drape, hand hygiene and skin antiseptic. A timeout was performed prior to the initiation of the procedure. The back was prepped with ChloraPrep in a sterile fashion, and a sterile drape was applied covering the operative field. A sterile gown and sterile gloves were used for the procedure. Under sonographic guidance, 2 16 gauge core biopsies of the left lower pole renal cortex were obtained. Final imaging was performed. Patient tolerated the procedure well without complication. Vital sign monitoring by nursing staff during the procedure will continue as patient is in the special procedures unit for post procedure observation. FINDINGS: The images document guide needle placement within the left lower pole renal cortex. Post biopsy images demonstrate no hemorrhage. IMPRESSION: Successful ultrasound-guided core biopsy of the left lower pole renal cortex. Electronically Signed   By: Marybelle Killings M.D.   On: 08/11/2016 12:13    Labs:  CBC:  Recent Labs  08/09/16 0132 08/10/16 0204 08/12/16 0422 08/13/16 0237  WBC 6.4 5.5 6.4 7.2  HGB 10.4* 12.1 11.9* 11.0*  HCT 31.1* 36.4 36.8 33.7*  PLT 225 281 309 313    COAGS:  Recent Labs  08/08/16 0159  INR 1.07    BMP:  Recent Labs  08/11/16 0218 08/12/16 0423 08/13/16 0237 08/14/16 0313  NA 140 140 140 138  K 3.7 3.7 3.8 3.4*  CL 106 102 103 102  CO2 27 28 27 24   GLUCOSE 98 110* 105* 212*  BUN 32* 42* 52* 60*  CALCIUM 7.7* 7.8* 7.7* 7.9*  CREATININE 1.36* 1.87* 2.07* 2.73*  GFRNONAA 38* 26* 23* 17*  GFRAA 45* 30* 27* 19*    LIVER FUNCTION TESTS:  Recent Labs  08/07/16 1850 08/09/16 0132 08/10/16 0204 08/12/16 0423 08/14/16 0313  BILITOT 0.5 0.1*  --   --   --   AST 25 16  --   --   --   ALT 15 10*  --   --   --   ALKPHOS 84 58  --   --   --   PROT 5.4* 4.6*  --   --   --   ALBUMIN 1.9* 2.3* 2.1* 1.8* 1.6*     Assessment and Plan: 1.  Renal failure We will plan to proceed with tunneled HD cath placement tomorrow as she has eaten today and received lovenox at 0930.  She will be NPO p MN and lovenox has actually been stopped after today's dose.  Labs and vitals have been reviewed. Risks and Benefits discussed with the patient including, but not limited to bleeding, infection, vascular injury, pneumothorax which may require chest tube placement, air embolism or even death All of the patient's questions were answered, patient is agreeable to proceed. Consent signed and in chart.  Electronically Signed: Henreitta Cea 08/14/2016, 11:16 AM   I spent a total of 25 Minutes at the the patient's bedside AND on the patient's hospital floor or unit, greater than 50% of which was counseling/coordinating care for renal failure

## 2016-08-15 ENCOUNTER — Inpatient Hospital Stay (HOSPITAL_COMMUNITY): Payer: Medicare Other

## 2016-08-15 ENCOUNTER — Encounter (HOSPITAL_COMMUNITY): Payer: Self-pay | Admitting: Interventional Radiology

## 2016-08-15 HISTORY — PX: IR GENERIC HISTORICAL: IMG1180011

## 2016-08-15 LAB — RENAL FUNCTION PANEL
Albumin: 1.8 g/dL — ABNORMAL LOW (ref 3.5–5.0)
Anion gap: 10 (ref 5–15)
BUN: 79 mg/dL — AB (ref 6–20)
CALCIUM: 7.7 mg/dL — AB (ref 8.9–10.3)
CHLORIDE: 104 mmol/L (ref 101–111)
CO2: 24 mmol/L (ref 22–32)
CREATININE: 3.39 mg/dL — AB (ref 0.44–1.00)
GFR calc non Af Amer: 13 mL/min — ABNORMAL LOW (ref >60)
GFR, EST AFRICAN AMERICAN: 15 mL/min — AB (ref >60)
Glucose, Bld: 150 mg/dL — ABNORMAL HIGH (ref 65–99)
PHOSPHORUS: 6.6 mg/dL — AB (ref 2.5–4.6)
Potassium: 3.9 mmol/L (ref 3.5–5.1)
Sodium: 138 mmol/L (ref 135–145)

## 2016-08-15 LAB — ALT: ALT: 35 U/L (ref 14–54)

## 2016-08-15 LAB — CBC
HEMATOCRIT: 32.3 % — AB (ref 36.0–46.0)
HEMOGLOBIN: 10.8 g/dL — AB (ref 12.0–15.0)
MCH: 31.2 pg (ref 26.0–34.0)
MCHC: 33.4 g/dL (ref 30.0–36.0)
MCV: 93.4 fL (ref 78.0–100.0)
Platelets: 323 10*3/uL (ref 150–400)
RBC: 3.46 MIL/uL — ABNORMAL LOW (ref 3.87–5.11)
RDW: 13.1 % (ref 11.5–15.5)
WBC: 16.1 10*3/uL — ABNORMAL HIGH (ref 4.0–10.5)

## 2016-08-15 MED ORDER — ALBUMIN HUMAN 25 % IV SOLN
25.0000 g | Freq: Once | INTRAVENOUS | Status: AC
  Administered 2016-08-15: 25 g via INTRAVENOUS
  Administered 2016-08-16: 12.5 g via INTRAVENOUS

## 2016-08-15 MED ORDER — MIDAZOLAM HCL 2 MG/2ML IJ SOLN
INTRAMUSCULAR | Status: AC | PRN
  Administered 2016-08-15: 0.5 mg via INTRAVENOUS
  Administered 2016-08-15: 1 mg via INTRAVENOUS

## 2016-08-15 MED ORDER — AMLODIPINE BESYLATE 5 MG PO TABS: 5.0000 mg | ORAL_TABLET | Freq: Every day | ORAL | Status: DC

## 2016-08-15 MED ORDER — ALBUMIN HUMAN 25 % IV SOLN
INTRAVENOUS | Status: AC
Start: 2016-08-15 — End: 2016-08-15
  Filled 2016-08-15: qty 100

## 2016-08-15 MED ORDER — MIDAZOLAM HCL 2 MG/2ML IJ SOLN
INTRAMUSCULAR | Status: AC
  Filled 2016-08-15: qty 2

## 2016-08-15 MED ORDER — LIDOCAINE HCL 1 % IJ SOLN
INTRAMUSCULAR | Status: AC
Start: 2016-08-15 — End: 2016-08-16
  Filled 2016-08-15: qty 20

## 2016-08-15 MED ORDER — ALBUMIN HUMAN 25 % IV SOLN
25.0000 g | Freq: Once | INTRAVENOUS | Status: AC
  Administered 2016-08-15: 25 g via INTRAVENOUS

## 2016-08-15 MED ORDER — FENTANYL CITRATE (PF) 100 MCG/2ML IJ SOLN
INTRAMUSCULAR | Status: AC
  Filled 2016-08-15: qty 2

## 2016-08-15 MED ORDER — HEPARIN SODIUM (PORCINE) 1000 UNIT/ML IJ SOLN
INTRAMUSCULAR | Status: AC
  Filled 2016-08-15: qty 1

## 2016-08-15 MED ORDER — SODIUM CHLORIDE 0.9 % IV SOLN
INTRAVENOUS | Status: AC | PRN
  Administered 2016-08-15: 10 mL/h via INTRAVENOUS

## 2016-08-15 MED ORDER — CEFAZOLIN SODIUM-DEXTROSE 2-4 GM/100ML-% IV SOLN
INTRAVENOUS | Status: AC
Start: 2016-08-15 — End: 2016-08-16
  Filled 2016-08-15: qty 100

## 2016-08-15 MED ORDER — CEFAZOLIN SODIUM-DEXTROSE 2-4 GM/100ML-% IV SOLN
2.0000 g | Freq: Once | INTRAVENOUS | Status: AC
  Administered 2016-08-15: 2 g via INTRAVENOUS

## 2016-08-15 MED ORDER — IPRATROPIUM-ALBUTEROL 0.5-2.5 (3) MG/3ML IN SOLN
3.0000 mL | Freq: Two times a day (BID) | RESPIRATORY_TRACT | Status: DC
  Administered 2016-08-16 – 2016-08-20 (×8): 3 mL via RESPIRATORY_TRACT
  Filled 2016-08-15 (×9): qty 3

## 2016-08-15 MED ORDER — ALBUMIN HUMAN 25 % IV SOLN
INTRAVENOUS | Status: AC
  Filled 2016-08-15: qty 100

## 2016-08-15 MED ORDER — FENTANYL CITRATE (PF) 100 MCG/2ML IJ SOLN
INTRAMUSCULAR | Status: AC | PRN
  Administered 2016-08-15: 25 ug via INTRAVENOUS
  Administered 2016-08-15: 50 ug via INTRAVENOUS

## 2016-08-15 NOTE — Procedures (Signed)
R IJ tunneled HD catheter placement No complication No blood loss. See complete dictation in [facility].

## 2016-08-15 NOTE — Progress Notes (Signed)
[facility] PROGRESS NOTE  Mary Chapman WGN:[id number] DOB: November 05, 1945 DOA: 08/07/2016  PCP: [facility]  Subjective/Interval History: I appreciate Dr. Sanda Klein help, plans for HD catheter and to start hemodialysis noted. Biopsy back with collapsing FSGS.  Brief History/Interval Summary: 72 year old African-American female with a past medical history of hypertension who presented with complaints of shortness of breath and leg swelling. Her symptoms started about a week ago with leg swelling. She was given diuretics by her primary care provider with no improvement. She subsequently decided to come into the hospital. She was found to have elevated creatinine. She was hospitalized for further management.  Reason for Visit: Nephrotic syndrome  Consultants: Nephrology. Interventional radiology.  Procedures:   Transthoracic echocardiogram Study Conclusions  - Left ventricle: The cavity size was normal. Systolic function was   vigorous. The estimated ejection fraction was in the range of 65%   to 70%. Wall motion was normal; there were no regional wall   motion abnormalities. Doppler parameters are consistent with   abnormal left ventricular relaxation (grade 1 diastolic   dysfunction). There was no evidence of elevated ventricular   filling pressure by Doppler parameters. - Aortic valve: There was no regurgitation. - Aortic root: The aortic root was normal in size. - Mitral valve: There was mild regurgitation. - Right ventricle: Systolic function was normal. - Right atrium: The atrium was normal in size. - Tricuspid valve: There was mild regurgitation. - Pulmonic valve: There was no regurgitation. - Pulmonary arteries: Systolic pressure was within the normal   range. - Inferior vena cava: The vessel was dilated. The respirophasic   diameter changes were blunted (< 50%), consistent with elevated   central venous pressure. - Pericardium, extracardiac: There was no  pericardial effusion.  Impressions: - Consider non-cardiac causes for anasarca and dyspnea.  Renal biopsy 3/26  Antibiotics: None   ROS: Denies any nausea or vomiting.  Objective:  Vital Signs  Vitals:   08/14/16 2042 08/14/16 2108 08/15/16 0428 08/15/16 0725  BP: 137/75  127/72   Pulse: 87  76   Resp: 18  18   Temp: 98.5 F (36.9 C)  97.7 F (36.5 C)   TempSrc: Oral  Oral   SpO2: 96% 94% 95% 95%  Weight:   89.1 kg (196 lb 8 oz)   Height:        Intake/Output Summary (Last 24 hours) at 08/15/16 1114 Last data filed at 08/15/16 0730  Gross per 24 hour  Intake              760 ml  Output              400 ml  Net              360 ml   Filed Weights   08/13/16 0522 08/14/16 0300 08/15/16 0428  Weight: 87.5 kg (193 lb) 88.2 kg (194 lb 7.1 oz) 89.1 kg (196 lb 8 oz)    General appearance: alert, cooperative, appears stated age and no distress Resp: Worsening breath sounds today. Few crackles at the bases. Mild tachypnea.   Cardio: regular rate and rhythm, S1, S2 normal, no murmur, click, rub or gallop GI: soft, non-tender; bowel sounds normal; no masses,  no organomegaly Extremities: Pitting edema noted bilateral lower extremities. Hasn't had any significant improvement over the last 2 days. Neurologic: No focal deficits.  Lab Results:  Data Reviewed: I have personally reviewed following labs and imaging studies  CBC:  Recent Labs Lab 08/09/16  3790 08/10/16 2409 08/12/16 0422 08/13/16 0237 08/15/16 0651  WBC 6.4 5.5 6.4 7.2 16.1*  HGB 10.4* 12.1 11.9* 11.0* 10.8*  HCT 31.1* 36.4 36.8 33.7* 32.3*  MCV 95.4 95.5 97.6 95.7 93.4  PLT 225 281 309 313 735    Basic Metabolic Panel:  Recent Labs Lab 08/10/16 0204 08/11/16 0218 08/12/16 0423 08/13/16 0237 08/14/16 0313 08/15/16 0224  NA 141 140 140 140 138 138  K 3.3* 3.7 3.7 3.8 3.4* 3.9  CL 106 106 102 103 102 104  CO2 28 27 28 27 24 24   GLUCOSE 100* 98 110* 105* 212* 150*  BUN 34* 32* 42* 52*  60* 79*  CREATININE 1.22* 1.36* 1.87* 2.07* 2.73* 3.39*  CALCIUM 7.8* 7.7* 7.8* 7.7* 7.9* 7.7*  PHOS 5.0*  --  5.3*  --  6.1* 6.6*    GFR: Estimated Creatinine Clearance: 17.4 mL/min (A) (by C-G formula based on SCr of 3.39 mg/dL (H)).  Liver Function Tests:  Recent Labs Lab 08/09/16 0132 08/10/16 0204 08/12/16 0423 08/14/16 0313 08/15/16 0224  AST 16  --   --   --   --   ALT 10*  --   --   --   --   ALKPHOS 58  --   --   --   --   BILITOT 0.1*  --   --   --   --   PROT 4.6*  --   --   --   --   ALBUMIN 2.3* 2.1* 1.8* 1.6* 1.8*    Coagulation Profile: No results for input(s): INR, PROTIME in the last 168 hours.  Cardiac Enzymes: No results for input(s): CKTOTAL, CKMB, CKMBINDEX, TROPONINI in the last 168 hours.  CBG: No results for input(s): GLUCAP in the last 168 hours.  Lipid Profile: No results for input(s): CHOL, HDL, LDLCALC, TRIG, CHOLHDL, LDLDIRECT in the last 72 hours.   Recent Results (from the past 240 hour(s))  Culture, expectorated sputum-assessment     Status: None   Collection Time: 08/08/16  4:02 AM  Result Value Ref Range Status   Specimen Description EXPECTORATED SPUTUM  Final   Special Requests NONE  Final   Sputum evaluation THIS SPECIMEN IS ACCEPTABLE FOR SPUTUM CULTURE  Final   Report Status 08/08/2016 FINAL  Final  Culture, respiratory (NON-Expectorated)     Status: None   Collection Time: 08/08/16  4:02 AM  Result Value Ref Range Status   Specimen Description EXPECTORATED SPUTUM  Final   Special Requests NONE Reflexed from [id number]  Final   Gram Stain   Final    FEW SQUAMOUS EPITHELIAL CELLS PRESENT FEW WBC PRESENT, PREDOMINANTLY MONONUCLEAR RARE GRAM POSITIVE COCCI IN PAIRS    Culture Consistent with normal respiratory flora.  Final   Report Status 08/10/2016 FINAL  Final      Radiology Studies: No results found.   Medications:  Scheduled: . amLODipine  10 mg Oral Daily  . atorvastatin  20 mg Oral q1800  . furosemide  160 mg  Intravenous Q6H  . ipratropium-albuterol  3 mL Nebulization TID  . metolazone  10 mg Oral BID  . metoprolol tartrate  25 mg Oral BID  . nicotine  21 mg Transdermal Daily  . predniSONE  60 mg Oral Q breakfast  . sodium chloride flush  3 mL Intravenous Q12H  . tacrolimus  3 mg Oral BID   Continuous:  QAS:[id number] **OR** acetaminophen, albuterol, hydrALAZINE, ondansetron **OR** ondansetron (ZOFRAN) IV, zolpidem  Assessment/Plan:  Principal Problem:  Anasarca Active Problems:   Tobacco abuse   Essential hypertension   AKI (acute kidney injury) ([place])   Dyspnea    Anasarca Secondary to nephrotic syndrome Patient's echocardiogram shows normal systolic function. LFTs are normal. No history of liver disease. No history of alcohol intake. UA does show significant proteinuria. She was on high dose of Lasix, switched to 100 mg of Demadex, urine output dropped, weight increased. Back on high dose of Lasix, volume is not controlled despite high doses, per nephrology will need HD. Continue prednisone and Prograf. Head HD catheter to be placed and hemodialysis to be started ASAP.  AKI (acute kidney injury)/nephrotic syndrome No previously known history of kidney disease. She does have a history of hypertension but is on only one medication at home. Followed regularly by her primary care provider. Patient underwent renal biopsy on 3/26. Her results are pending. Currently on torsemide and metolazone. Nephrology is following. Rise in creatinine noted. SPEP does not show any monoclonal protein.  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  Dyspnea Dyspnea was likely due to fluid overload. Chest x-ray did not show any pulmonary edema or infiltrates. She was given diuretics. Dyspnea has improved slightly. She was given ceftriaxone and azithromycin in the emergency Department. WBC is normal. She was noted to have low-grade fever. Could have some form  of upper respiratory tract infection.  Completed 8 days course of azithromycin.  Essential hypertension  Continue amlodipine and metoprolol. Blood pressure has improved. Continue to monitor for now.  Tobacco abuse Patient was counseled. Nicotine patch.    DVT Prophylaxis: Lovenox    Code Status: Full code  Family Communication: Discussed with the patient Disposition Plan: Management as outlined above.    LOS: 8 days   [facility]  [facility] Pager [phone] 08/15/2016, 11:14 AM  If 7PM-7AM, please contact night-coverage at [email or web address], password [facility]

## 2016-08-15 NOTE — Progress Notes (Signed)
[facility] Rounding Note  Subjective:   Preliminary on renal biopsy is collapsing FSGS (very aggressive, no proven treatments, case reports of response with steroids/prograf) Waiting on the final 650 cc UOP past 24 hours on ceiling doses of diuretics Continued rapid rise in creatinine up to 3.3 today Says "just feel so swollen"  Says lives in [place] - reassured her that if chronic HD required could go to the [facility]  Objective Vital signs in last 24 hours: Vitals:   08/14/16 2042 08/14/16 2108 08/15/16 0428 08/15/16 0725  BP: 137/75  127/72   Pulse: 87  76   Resp: 18  18   Temp: 98.5 F (36.9 C)  97.7 F (36.5 C)   TempSrc: Oral  Oral   SpO2: 96% 94% 95% 95%  Weight:   89.1 kg (196 lb 8 oz)   Height:       Weight change: 0.932 kg (2 lb 0.9 oz)  Intake/Output Summary (Last 24 hours) at 08/15/16 1120 Last data filed at 08/15/16 0730  Gross per 24 hour  Intake              760 ml  Output              400 ml  Net              360 ml    Physical Exam: Very nice older AAF Sitting by the window Lots of questions about her kidney disease VS as noted + 6 cm JVD Bilat soft exp wheezes, few crackles esp L Abd no focal tenderness 2-3+ pitting edema to the hips   Recent Labs Lab 08/12/16 0423 08/13/16 0237 08/14/16 0313 08/15/16 0224  NA 140 140 138 138  K 3.7 3.8 3.4* 3.9  CL 102 103 102 104  CO2 28 27 24 24   GLUCOSE 110* 105* 212* 150*  BUN 42* 52* 60* 79*  CREATININE 1.87* 2.07* 2.73* 3.39*  CALCIUM 7.8* 7.7* 7.9* 7.7*  PHOS 5.3*  --  6.1* 6.6*    Recent Labs Lab 08/09/16 0132  08/12/16 0423 08/14/16 0313 08/15/16 0224  AST 16  --   --   --   --   ALT 10*  --   --   --   --   ALKPHOS 58  --   --   --   --   BILITOT 0.1*  --   --   --   --   PROT 4.6*  --   --   --   --   ALBUMIN 2.3*  < > 1.8* 1.6* 1.8*  < > = values in this interval not displayed.  Recent Labs Lab 08/09/16 0132 08/10/16 0204 08/12/16 0422 08/13/16 0237 08/15/16 0651   WBC 6.4 5.5 6.4 7.2 16.1*  HGB 10.4* 12.1 11.9* 11.0* 10.8*  HCT 31.1* 36.4 36.8 33.7* 32.3*  MCV 95.4 95.5 97.6 95.7 93.4  PLT 225 281 309 313 323    Scheduled Medications: . amLODipine  10 mg Oral Daily  . atorvastatin  20 mg Oral q1800  . furosemide  160 mg Intravenous Q6H  . ipratropium-albuterol  3 mL Nebulization TID  . metolazone  10 mg Oral BID  . metoprolol tartrate  25 mg Oral BID  . nicotine  21 mg Transdermal Daily  . predniSONE  60 mg Oral Q breakfast  . sodium chloride flush  3 mL Intravenous Q12H  . tacrolimus  3 mg Oral BID   Assessment/Plan:  71 year old black female with very little past medical history presenting with what appears to be nephrotic syndrome with some acute kidney injury. Outpt eval notable for neg HIV, neg ANA, Hep serologies all neg, UPC ?>20 gm proteinuria, UA with 6-10 RBC's. Kidneys 12.4 cm, normal echogenicity. SPEP no M-spike. Renal bx done 08/11/16 shows collapsing FSGS  1. Nephrotic syndrome with acute kidney injury 2/2 biopsy proven collapsing FSGS which is very aggressive and there are no proven treatments (some random case reports of successful rx with steroids, prograf).  She is losing GFR rapidly and has completely failed ceiling doses of diuretics.  Will need to move ahead with dialysis  For [facility] in IR today, orders in for first treatment today, 2nd tomorrow. IV albumin with HD.  Initiated steroids and prograf but skeptical will have any impact. Needs HD for volume control more urgently, and ultimately for renal failure 2. Hypertension/volume - no response to ceiling dose diuretics. Dialysis. Stop diuretics. Back off on BP meds to allow for UF.  3. Anemia  - not an issue at this time and Hb overall stable post renal bx (no acute fall but drifting down some). 4. DVT prophylaxis - stopped Lovenox (for procedure, and b/c just had renal bx so bleeding risk). SCD's   Jamal Maes, MD [facility] [phone] Pager 08/15/2016,  11:20 AM

## 2016-08-15 NOTE — Procedures (Signed)
I have personally attended this patient's dialysis session.   1st HD today primarily for volume overload refractory to diuretics 3 liter goal tolerating so far. Plan another tmt tomorrow  Jamal Maes, MD [place] Pager 08/15/2016, 3:44 PM

## 2016-08-16 DIAGNOSIS — N170 Acute kidney failure with tubular necrosis: Principal | ICD-10-CM

## 2016-08-16 LAB — RENAL FUNCTION PANEL
ALBUMIN: 2.5 g/dL — AB (ref 3.5–5.0)
Albumin: 2.4 g/dL — ABNORMAL LOW (ref 3.5–5.0)
Anion gap: 10 (ref 5–15)
Anion gap: 13 (ref 5–15)
BUN: 66 mg/dL — ABNORMAL HIGH (ref 6–20)
BUN: 69 mg/dL — ABNORMAL HIGH (ref 6–20)
CALCIUM: 7.7 mg/dL — AB (ref 8.9–10.3)
CHLORIDE: 102 mmol/L (ref 101–111)
CO2: 26 mmol/L (ref 22–32)
CO2: 24 mmol/L (ref 22–32)
CREATININE: 3.03 mg/dL — AB (ref 0.44–1.00)
CREATININE: 3.26 mg/dL — AB (ref 0.44–1.00)
Calcium: 7.4 mg/dL — ABNORMAL LOW (ref 8.9–10.3)
Chloride: 100 mmol/L — ABNORMAL LOW (ref 101–111)
GFR calc Af Amer: 15 mL/min — ABNORMAL LOW (ref >60)
GFR calc non Af Amer: 13 mL/min — ABNORMAL LOW (ref >60)
GFR, EST AFRICAN AMERICAN: 17 mL/min — AB (ref >60)
GFR, EST NON AFRICAN AMERICAN: 15 mL/min — AB (ref >60)
GLUCOSE: 129 mg/dL — AB (ref 65–99)
Glucose, Bld: 105 mg/dL — ABNORMAL HIGH (ref 65–99)
PHOSPHORUS: 5.6 mg/dL — AB (ref 2.5–4.6)
POTASSIUM: 4 mmol/L (ref 3.5–5.1)
Phosphorus: 5.9 mg/dL — ABNORMAL HIGH (ref 2.5–4.6)
Potassium: 3.7 mmol/L (ref 3.5–5.1)
SODIUM: 137 mmol/L (ref 135–145)
Sodium: 138 mmol/L (ref 135–145)

## 2016-08-16 LAB — CBC
HCT: 31.4 % — ABNORMAL LOW (ref 36.0–46.0)
HEMOGLOBIN: 10.5 g/dL — AB (ref 12.0–15.0)
MCH: 31.5 pg (ref 26.0–34.0)
MCHC: 33.4 g/dL (ref 30.0–36.0)
MCV: 94.3 fL (ref 78.0–100.0)
PLATELETS: 317 10*3/uL (ref 150–400)
RBC: 3.33 MIL/uL — AB (ref 3.87–5.11)
RDW: 13.2 % (ref 11.5–15.5)
WBC: 12.5 10*3/uL — ABNORMAL HIGH (ref 4.0–10.5)

## 2016-08-16 LAB — HEPATITIS B SURFACE ANTIGEN: Hepatitis B Surface Ag: NEGATIVE

## 2016-08-16 LAB — HEPATITIS B SURFACE ANTIBODY,QUALITATIVE: HEP B S AB: REACTIVE

## 2016-08-16 LAB — HEPATITIS B CORE ANTIBODY, TOTAL: Hep B Core Total Ab: NEGATIVE

## 2016-08-16 MED ORDER — SODIUM CHLORIDE 0.9 % IV SOLN
510.0000 mg | INTRAVENOUS | Status: AC
  Administered 2016-08-16: 510 mg via INTRAVENOUS
  Filled 2016-08-16: qty 17

## 2016-08-16 MED ORDER — ALBUMIN HUMAN 25 % IV SOLN
INTRAVENOUS | Status: AC
Start: 2016-08-16 — End: 2016-08-16
  Administered 2016-08-16: 12.5 g via INTRAVENOUS
  Filled 2016-08-16: qty 50

## 2016-08-16 MED ORDER — ALBUMIN HUMAN 25 % IV SOLN
INTRAVENOUS | Status: AC
  Administered 2016-08-16: 12.5 g via INTRAVENOUS
  Filled 2016-08-16: qty 50

## 2016-08-16 MED ORDER — ALBUMIN HUMAN 25 % IV SOLN
12.5000 g | Freq: Once | INTRAVENOUS | Status: AC
  Administered 2016-08-16: 12.5 g via INTRAVENOUS

## 2016-08-16 MED ORDER — DARBEPOETIN ALFA 60 MCG/0.3ML IJ SOSY
PREFILLED_SYRINGE | INTRAMUSCULAR | Status: AC
  Filled 2016-08-16: qty 0.3

## 2016-08-16 MED ORDER — DARBEPOETIN ALFA 60 MCG/0.3ML IJ SOSY
60.0000 ug | PREFILLED_SYRINGE | INTRAMUSCULAR | Status: DC
  Administered 2016-08-16: 60 ug via INTRAVENOUS

## 2016-08-16 NOTE — Procedures (Signed)
I have personally attended this patient's dialysis session.  2nd HD today UF goal 3 liters if BP allows Albumin for BP support 4K bath  Jamal Maes, MD [facility] [phone] Pager 08/16/2016, 11:08 AM

## 2016-08-16 NOTE — Progress Notes (Signed)
[facility] PROGRESS NOTE  Signe Tackitt WJX:[id number] DOB: 06-22-1945 DOA: 08/07/2016  PCP: [facility]  Subjective/Interval History: Patient feels better, had dialysis yesterday with removal of 3 L of fluids. Plans for dialysis again today, with UF goal of removal of 3 L of fluids..  Brief History/Interval Summary: 71 year old African-American female with a past medical history of hypertension who presented with complaints of shortness of breath and leg swelling. Her symptoms started about a week ago with leg swelling. She was given diuretics by her primary care provider with no improvement. She subsequently decided to come into the hospital. She was found to have elevated creatinine. She was hospitalized for further management.  Reason for Visit: Nephrotic syndrome  Consultants: Nephrology. Interventional radiology.  Procedures:   Transthoracic echocardiogram Study Conclusions  - Left ventricle: The cavity size was normal. Systolic function was   vigorous. The estimated ejection fraction was in the range of 65%   to 70%. Wall motion was normal; there were no regional wall   motion abnormalities. Doppler parameters are consistent with   abnormal left ventricular relaxation (grade 1 diastolic   dysfunction). There was no evidence of elevated ventricular   filling pressure by Doppler parameters. - Aortic valve: There was no regurgitation. - Aortic root: The aortic root was normal in size. - Mitral valve: There was mild regurgitation. - Right ventricle: Systolic function was normal. - Right atrium: The atrium was normal in size. - Tricuspid valve: There was mild regurgitation. - Pulmonic valve: There was no regurgitation. - Pulmonary arteries: Systolic pressure was within the normal   range. - Inferior vena cava: The vessel was dilated. The respirophasic   diameter changes were blunted (< 50%), consistent with elevated   central venous pressure. -  Pericardium, extracardiac: There was no pericardial effusion.  Impressions: - Consider non-cardiac causes for anasarca and dyspnea.  Renal biopsy 3/26  Antibiotics: None   ROS: Denies any nausea or vomiting.  Objective:  Vital Signs  Vitals:   08/16/16 1030 08/16/16 1100 08/16/16 1130 08/16/16 1200  BP: 125/83 124/78 129/77 128/80  Pulse: 70 68 71 70  Resp:      Temp:      TempSrc:      SpO2:      Weight:      Height:        Intake/Output Summary (Last 24 hours) at 08/16/16 1222 Last data filed at 08/15/16 1759  Gross per 24 hour  Intake                0 ml  Output             3000 ml  Net            -3000 ml   Filed Weights   08/15/16 1759 08/16/16 0427 08/16/16 0900  Weight: 86.6 kg (190 lb 14.7 oz) 86.5 kg (190 lb 12.8 oz) 87.2 kg (192 lb 3.9 oz)    General appearance: alert, cooperative, appears stated age and no distress Resp: Worsening breath sounds today. Few crackles at the bases. Mild tachypnea.   Cardio: regular rate and rhythm, S1, S2 normal, no murmur, click, rub or gallop GI: soft, non-tender; bowel sounds normal; no masses,  no organomegaly Extremities: Pitting edema noted bilateral lower extremities. Hasn't had any significant improvement over the last 2 days. Neurologic: No focal deficits.  Lab Results:  Data Reviewed: I have personally reviewed following labs and imaging studies  CBC:  Recent Labs Lab 08/10/16 0204  08/12/16 0422 08/13/16 0237 08/15/16 0651 08/16/16 0938  WBC 5.5 6.4 7.2 16.1* 12.5*  HGB 12.1 11.9* 11.0* 10.8* 10.5*  HCT 36.4 36.8 33.7* 32.3* 31.4*  MCV 95.5 97.6 95.7 93.4 94.3  PLT 281 309 313 323 440    Basic Metabolic Panel:  Recent Labs Lab 08/12/16 0423 08/13/16 0237 08/14/16 0313 08/15/16 0224 08/16/16 0213 08/16/16 0939  NA 140 140 138 138 138 137  K 3.7 3.8 3.4* 3.9 4.0 3.7  CL 102 103 102 104 102 100*  CO2 28 27 24 24 26 24   GLUCOSE 110* 105* 212* 150* 105* 129*  BUN 42* 52* 60* 79* 66* 69*    CREATININE 1.87* 2.07* 2.73* 3.39* 3.03* 3.26*  CALCIUM 7.8* 7.7* 7.9* 7.7* 7.4* 7.7*  PHOS 5.3*  --  6.1* 6.6* 5.9* 5.6*    GFR: Estimated Creatinine Clearance: 17.9 mL/min (A) (by C-G formula based on SCr of 3.26 mg/dL (H)).  Liver Function Tests:  Recent Labs Lab 08/12/16 0423 08/14/16 0313 08/15/16 0224 08/15/16 1601 08/16/16 0213 08/16/16 0939  ALT  --   --   --  35  --   --   ALBUMIN 1.8* 1.6* 1.8*  --  2.4* 2.5*    Coagulation Profile: No results for input(s): INR, PROTIME in the last 168 hours.  Cardiac Enzymes: No results for input(s): CKTOTAL, CKMB, CKMBINDEX, TROPONINI in the last 168 hours.  CBG: No results for input(s): GLUCAP in the last 168 hours.  Lipid Profile: No results for input(s): CHOL, HDL, LDLCALC, TRIG, CHOLHDL, LDLDIRECT in the last 72 hours.   Recent Results (from the past 240 hour(s))  Culture, expectorated sputum-assessment     Status: None   Collection Time: 08/08/16  4:02 AM  Result Value Ref Range Status   Specimen Description EXPECTORATED SPUTUM  Final   Special Requests NONE  Final   Sputum evaluation THIS SPECIMEN IS ACCEPTABLE FOR SPUTUM CULTURE  Final   Report Status 08/08/2016 FINAL  Final  Culture, respiratory (NON-Expectorated)     Status: None   Collection Time: 08/08/16  4:02 AM  Result Value Ref Range Status   Specimen Description EXPECTORATED SPUTUM  Final   Special Requests NONE Reflexed from [id number]  Final   Gram Stain   Final    FEW SQUAMOUS EPITHELIAL CELLS PRESENT FEW WBC PRESENT, PREDOMINANTLY MONONUCLEAR RARE GRAM POSITIVE COCCI IN PAIRS    Culture Consistent with normal respiratory flora.  Final   Report Status 08/10/2016 FINAL  Final      Radiology Studies: Ir Fluoro Guide Cv Line Right  Result Date: 08/15/2016 CLINICAL DATA:  Renal failure, needs durable venous access for hemodialysis EXAM: TUNNELED HEMODIALYSIS CATHETER PLACEMENT WITH ULTRASOUND AND FLUOROSCOPIC GUIDANCE TECHNIQUE: The procedure,  risks, benefits, and alternatives were explained to the patient. Questions regarding the procedure were encouraged and answered. The patient understands and consents to the procedure. As antibiotic prophylaxis, cefazolin 2 g was ordered pre-procedure and administered intravenously within one hour of incision.Patency of the right IJ vein was confirmed with ultrasound with image documentation. An appropriate skin site was determined. Region was prepped using maximum barrier technique including cap and mask, sterile gown, sterile gloves, large sterile sheet, and Chlorhexidine as cutaneous antisepsis. The region was infiltrated locally with 1% lidocaine. Intravenous Fentanyl and Versed were administered as conscious sedation during continuous monitoring of the patient's level of consciousness and physiological / cardiorespiratory status by the radiology RN, with a total moderate sedation time of 15 minutes. Under real-time ultrasound guidance,  the right IJ vein was accessed with a 21 gauge micropuncture needle; the needle tip within the vein was confirmed with ultrasound image documentation. Needle exchanged over the 018 guidewire for transitional dilator, which allowed advancement of a Benson wire into the IVC. Over this, an MPA catheter was advanced. A Palindrome 23 hemodialysis catheter was tunneled from the right anterior chest wall approach to the right IJ dermatotomy site. The MPA catheter was exchanged over an Amplatz wire for serial vascular dilators which allow placement of a peel-away sheath, through which the catheter was advanced under intermittent fluoroscopy, positioned with its tips in the proximal and midright atrium. Spot chest radiograph confirms good catheter position. No pneumothorax. Catheter was flushed and primed per protocol. Catheter secured externally with O Prolene sutures. The right IJ dermatotomy site was closed with Dermabond. COMPLICATIONS: COMPLICATIONS None immediate FLUOROSCOPY TIME:   0.2 minute (86 uGym2 DAP) COMPARISON:  None IMPRESSION: 1. Technically successful placement of tunneled right IJ hemodialysis catheter with ultrasound and fluoroscopic guidance. Ready for routine use. ACCESS: Remains approachable for percutaneous intervention as needed. Electronically Signed   By: Lucrezia Europe M.D.   On: 08/15/2016 15:45   Ir US Guide Vasc Access Right  Result Date: 08/15/2016 CLINICAL DATA:  Renal failure, needs durable venous access for hemodialysis EXAM: TUNNELED HEMODIALYSIS CATHETER PLACEMENT WITH ULTRASOUND AND FLUOROSCOPIC GUIDANCE TECHNIQUE: The procedure, risks, benefits, and alternatives were explained to the patient. Questions regarding the procedure were encouraged and answered. The patient understands and consents to the procedure. As antibiotic prophylaxis, cefazolin 2 g was ordered pre-procedure and administered intravenously within one hour of incision.Patency of the right IJ vein was confirmed with ultrasound with image documentation. An appropriate skin site was determined. Region was prepped using maximum barrier technique including cap and mask, sterile gown, sterile gloves, large sterile sheet, and Chlorhexidine as cutaneous antisepsis. The region was infiltrated locally with 1% lidocaine. Intravenous Fentanyl and Versed were administered as conscious sedation during continuous monitoring of the patient's level of consciousness and physiological / cardiorespiratory status by the radiology RN, with a total moderate sedation time of 15 minutes. Under real-time ultrasound guidance, the right IJ vein was accessed with a 21 gauge micropuncture needle; the needle tip within the vein was confirmed with ultrasound image documentation. Needle exchanged over the 018 guidewire for transitional dilator, which allowed advancement of a Benson wire into the IVC. Over this, an MPA catheter was advanced. A Palindrome 23 hemodialysis catheter was tunneled from the right anterior chest wall  approach to the right IJ dermatotomy site. The MPA catheter was exchanged over an Amplatz wire for serial vascular dilators which allow placement of a peel-away sheath, through which the catheter was advanced under intermittent fluoroscopy, positioned with its tips in the proximal and midright atrium. Spot chest radiograph confirms good catheter position. No pneumothorax. Catheter was flushed and primed per protocol. Catheter secured externally with O Prolene sutures. The right IJ dermatotomy site was closed with Dermabond. COMPLICATIONS: COMPLICATIONS None immediate FLUOROSCOPY TIME:  0.2 minute (86 uGym2 DAP) COMPARISON:  None IMPRESSION: 1. Technically successful placement of tunneled right IJ hemodialysis catheter with ultrasound and fluoroscopic guidance. Ready for routine use. ACCESS: Remains approachable for percutaneous intervention as needed. Electronically Signed   By: Lucrezia Europe M.D.   On: 08/15/2016 15:45     Medications:  Scheduled: . atorvastatin  20 mg Oral q1800  . Darbepoetin Alfa      . darbepoetin (ARANESP) injection - DIALYSIS  60 mcg Intravenous Q Sat-HD  .  ipratropium-albuterol  3 mL Nebulization BID  . metoprolol tartrate  25 mg Oral BID  . nicotine  21 mg Transdermal Daily  . predniSONE  60 mg Oral Q breakfast  . sodium chloride flush  3 mL Intravenous Q12H  . tacrolimus  3 mg Oral BID   Continuous:  GHW:EXHBZJIRCVELF **OR** acetaminophen, albuterol, hydrALAZINE, ondansetron **OR** ondansetron (ZOFRAN) IV, zolpidem  Assessment/Plan:  Principal Problem:   Anasarca Active Problems:   Tobacco abuse   Essential hypertension   AKI (acute kidney injury) ([place])   Dyspnea    Anasarca Secondary to nephrotic syndrome Patient's echocardiogram shows normal systolic function. LFTs are normal. No history of liver disease. No history of alcohol intake. UA does show significant proteinuria. She was on high dose of Lasix, switched to 100 mg of Demadex, urine output dropped,  weight increased. Back on high dose of Lasix, volume is not controlled despite high doses, per nephrology will need HD. Continue prednisone and Prograf. Started on dialysis, this is her second day.  AKI (acute kidney injury)/nephrotic syndrome No previously known history of kidney disease. She does have a history of hypertension but is on only one medication at home. Followed regularly by her primary care provider. Patient underwent renal biopsy on 3/26. Her results are pending. Currently on torsemide and metolazone. Nephrology is following. Rise in creatinine noted. SPEP does not show any monoclonal protein.  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  Dyspnea Dyspnea was likely due to fluid overload. Chest x-ray did not show any pulmonary edema or infiltrates. She was given diuretics. Dyspnea has improved slightly. She was given ceftriaxone and azithromycin in the emergency Department. WBC is normal. She was noted to have low-grade fever. Could have some form of upper respiratory tract infection.  Completed 8 days course of azithromycin.  Essential hypertension  Continue amlodipine and metoprolol. Blood pressure has improved. Continue to monitor for now.  Tobacco abuse Patient was counseled. Nicotine patch.    DVT Prophylaxis: Lovenox    Code Status: Full code  Family Communication: Discussed with the patient Disposition Plan: Management as outlined above.    LOS: 9 days   [facility] Pager [phone] 08/16/2016, 12:22 PM  If 7PM-7AM, please contact night-coverage at [email or web address], password [facility]

## 2016-08-16 NOTE — Progress Notes (Signed)
[facility] Rounding Note  Subjective:   Preliminary on renal biopsy is collapsing FSGS (very aggressive, no proven treatments, case reports of response with steroids/prograf) Waiting on the final Max 650 cc UOP on ceiling doses of diuretics/continued rapid creatinine rise->TDC placed 3/30 and had HD/UF last PM with 3 kg off Getting 2nd TMT this AM  Lives in [facility] - reassured her that if chronic HD required could go to the [facility]  Objective Vital signs in last 24 hours: Vitals:   08/15/16 2035 08/16/16 0427 08/16/16 0900 08/16/16 0910  BP: 121/61 126/76 134/75 127/74  Pulse: 80 70 78 75  Resp: 18 18 20    Temp: 98.6 F (37 C) 98.6 F (37 C) 97.9 F (36.6 C)   TempSrc: Oral Oral Oral   SpO2: 98% 99% 96%   Weight:  86.5 kg (190 lb 12.8 oz) 87.2 kg (192 lb 3.9 oz)   Height:       Weight change: 0.468 kg (1 lb 0.5 oz)  Intake/Output Summary (Last 24 hours) at 08/16/16 1057 Last data filed at 08/15/16 1759  Gross per 24 hour  Intake                0 ml  Output             3000 ml  Net            -3000 ml    Physical Exam: Very nice older AAF Seen on dialysis Comfortable/NAD VS as noted + 6 cm JVD Anteriorly clear lung fields Abd no focal tenderness 2-3+ pitting edema to the hips   Recent Labs Lab 08/15/16 0224 08/16/16 0213 08/16/16 0939  NA 138 138 137  K 3.9 4.0 3.7  CL 104 102 100*  CO2 24 26 24   GLUCOSE 150* 105* 129*  BUN 79* 66* 69*  CREATININE 3.39* 3.03* 3.26*  CALCIUM 7.7* 7.4* 7.7*  PHOS 6.6* 5.9* 5.6*    Recent Labs Lab 08/15/16 0224 08/15/16 1601 08/16/16 0213 08/16/16 0939  ALT  --  35  --   --   ALBUMIN 1.8*  --  2.4* 2.5*    Recent Labs Lab 08/10/16 0204 08/12/16 0422 08/13/16 0237 08/15/16 0651 08/16/16 0938  WBC 5.5 6.4 7.2 16.1* 12.5*  HGB 12.1 11.9* 11.0* 10.8* 10.5*  HCT 36.4 36.8 33.7* 32.3* 31.4*  MCV 95.5 97.6 95.7 93.4 94.3  PLT 281 309 313 323 317    Scheduled Medications: . amLODipine  5 mg Oral QHS  .  atorvastatin  20 mg Oral q1800  . ipratropium-albuterol  3 mL Nebulization BID  . metoprolol tartrate  25 mg Oral BID  . nicotine  21 mg Transdermal Daily  . predniSONE  60 mg Oral Q breakfast  . sodium chloride flush  3 mL Intravenous Q12H  . tacrolimus  3 mg Oral BID   Assessment/Plan:   71 year old black female with very little past medical history presenting with what appears to be nephrotic syndrome with some acute kidney injury. Outpt eval notable for neg HIV, neg ANA, Hep serologies all neg, UPC ?>20 gm proteinuria, UA with 6-10 RBC's. Kidneys 12.4 cm, normal echogenicity. SPEP no M-spike. Renal bx 08/11/16 shows collapsing FSGS.  Failed ceiling doses of diuretics and with rapidly rising creatinine ->dialysis initiated 08/15/16 via [place] placed by IR.   1. Nephrotic syndrome with AKI 2/2 biopsy proven collapsing FSGS which is very aggressive and there are no proven treatments (some random case reports of successful rx  with steroids, prograf).  Had rapid loss GFR and failed ceiling doses of diuretics. [place] placed in IR 3/30 and HD initiated. 2nd tmt today. Using albumin for BP support and to mobilize 3rd spp fluids. Initiated steroids and prograf but skeptical will have any impact.  2. Hypertension/volume - no response to ceiling dose diuretics. Dialysis. Stopped diuretics. Backing off on BP meds to allow for UF. Stop amlodipine 3. Anemia  - Hb has drifted from 12->10.5. TSat 25% but total serum iron only 29. Will dose w/Feraheme X 1. Add low dose of Aranesp. No heparin with HD (or otherwise) for at least a month post biopsy 4. DVT prophylaxis - stopped Lovenox (for procedure, and b/c just had renal bx so bleeding risk). SCD's   Jamal Maes, MD [place] Pager 08/16/2016, 10:57 AM

## 2016-08-17 LAB — RENAL FUNCTION PANEL
Albumin: 2.5 g/dL — ABNORMAL LOW (ref 3.5–5.0)
Anion gap: 9 (ref 5–15)
BUN: 44 mg/dL — ABNORMAL HIGH (ref 6–20)
CHLORIDE: 99 mmol/L — AB (ref 101–111)
CO2: 27 mmol/L (ref 22–32)
CREATININE: 2.64 mg/dL — AB (ref 0.44–1.00)
Calcium: 7.7 mg/dL — ABNORMAL LOW (ref 8.9–10.3)
GFR, EST AFRICAN AMERICAN: 20 mL/min — AB (ref >60)
GFR, EST NON AFRICAN AMERICAN: 17 mL/min — AB (ref >60)
Glucose, Bld: 102 mg/dL — ABNORMAL HIGH (ref 65–99)
PHOSPHORUS: 4 mg/dL (ref 2.5–4.6)
Potassium: 3.8 mmol/L (ref 3.5–5.1)
Sodium: 135 mmol/L (ref 135–145)

## 2016-08-17 NOTE — Progress Notes (Signed)
[facility] PROGRESS NOTE  Nayelis Bonito BTD:[id number] DOB: [birth year] DOA: 08/07/2016  PCP: [facility]  Subjective/Interval History: No complaints today, overall feels much better after dialysis started.  Brief History/Interval Summary: 71 year old African-American female with a past medical history of hypertension who presented with complaints of shortness of breath and leg swelling. Her symptoms started about a week ago with leg swelling. She was given diuretics by her primary care provider with no improvement. She subsequently decided to come into the hospital. She was found to have elevated creatinine. She was hospitalized for further management.  Reason for Visit: Nephrotic syndrome  Consultants: Nephrology. Interventional radiology.  Procedures:   Transthoracic echocardiogram Study Conclusions  - Left ventricle: The cavity size was normal. Systolic function was   vigorous. The estimated ejection fraction was in the range of 65%   to 70%. Wall motion was normal; there were no regional wall   motion abnormalities. Doppler parameters are consistent with   abnormal left ventricular relaxation (grade 1 diastolic   dysfunction). There was no evidence of elevated ventricular   filling pressure by Doppler parameters. - Aortic valve: There was no regurgitation. - Aortic root: The aortic root was normal in size. - Mitral valve: There was mild regurgitation. - Right ventricle: Systolic function was normal. - Right atrium: The atrium was normal in size. - Tricuspid valve: There was mild regurgitation. - Pulmonic valve: There was no regurgitation. - Pulmonary arteries: Systolic pressure was within the normal   range. - Inferior vena cava: The vessel was dilated. The respirophasic   diameter changes were blunted (< 50%), consistent with elevated   central venous pressure. - Pericardium, extracardiac: There was no pericardial effusion.  Impressions: - Consider  non-cardiac causes for anasarca and dyspnea.  Renal biopsy 3/26  Antibiotics: None   ROS: Denies any nausea or vomiting.  Objective:  Vital Signs  Vitals:   08/16/16 2049 08/16/16 2110 08/17/16 0513 08/17/16 1008  BP: 133/67 134/61 132/65   Pulse: 85 86 71   Resp: 17 17 17    Temp: 99 F (37.2 C) 98.2 F (36.8 C) 98.9 F (37.2 C)   TempSrc: Oral Oral Oral   SpO2: 94% 98% 95% 95%  Weight:      Height:        Intake/Output Summary (Last 24 hours) at 08/17/16 1114 Last data filed at 08/16/16 2111  Gross per 24 hour  Intake              118 ml  Output             4000 ml  Net            -3882 ml   Filed Weights   08/16/16 0427 08/16/16 0900 08/16/16 1244  Weight: 86.5 kg (190 lb 12.8 oz) 87.2 kg (192 lb 3.9 oz) 83.8 kg (184 lb 11.9 oz)    General appearance: alert, cooperative, appears stated age and no distress Resp: Worsening breath sounds today. Few crackles at the bases. Mild tachypnea.   Cardio: regular rate and rhythm, S1, S2 normal, no murmur, click, rub or gallop GI: soft, non-tender; bowel sounds normal; no masses,  no organomegaly Extremities: Pitting edema noted bilateral lower extremities. Hasn't had any significant improvement over the last 2 days. Neurologic: No focal deficits.  Lab Results:  Data Reviewed: I have personally reviewed following labs and imaging studies  CBC:  Recent Labs Lab 08/12/16 0422 08/13/16 0237 08/15/16 0651 08/16/16 0938  WBC 6.4 7.2 16.1*  12.5*  HGB 11.9* 11.0* 10.8* 10.5*  HCT 36.8 33.7* 32.3* 31.4*  MCV 97.6 95.7 93.4 94.3  PLT 309 313 323 161    Basic Metabolic Panel:  Recent Labs Lab 08/14/16 0313 08/15/16 0224 08/16/16 0213 08/16/16 0939 08/17/16 0151  NA 138 138 138 137 135  K 3.4* 3.9 4.0 3.7 3.8  CL 102 104 102 100* 99*  CO2 24 24 26 24 27   GLUCOSE 212* 150* 105* 129* 102*  BUN 60* 79* 66* 69* 44*  CREATININE 2.73* 3.39* 3.03* 3.26* 2.64*  CALCIUM 7.9* 7.7* 7.4* 7.7* 7.7*  PHOS 6.1* 6.6*  5.9* 5.6* 4.0    GFR: Estimated Creatinine Clearance: 21.6 mL/min (A) (by C-G formula based on SCr of 2.64 mg/dL (H)).  Liver Function Tests:  Recent Labs Lab 08/14/16 0313 08/15/16 0224 08/15/16 1601 08/16/16 0213 08/16/16 0939 08/17/16 0151  ALT  --   --  35  --   --   --   ALBUMIN 1.6* 1.8*  --  2.4* 2.5* 2.5*    Coagulation Profile: No results for input(s): INR, PROTIME in the last 168 hours.  Cardiac Enzymes: No results for input(s): CKTOTAL, CKMB, CKMBINDEX, TROPONINI in the last 168 hours.  CBG: No results for input(s): GLUCAP in the last 168 hours.  Lipid Profile: No results for input(s): CHOL, HDL, LDLCALC, TRIG, CHOLHDL, LDLDIRECT in the last 72 hours.   Recent Results (from the past 240 hour(s))  Culture, expectorated sputum-assessment     Status: None   Collection Time: 08/08/16  4:02 AM  Result Value Ref Range Status   Specimen Description EXPECTORATED SPUTUM  Final   Special Requests NONE  Final   Sputum evaluation THIS SPECIMEN IS ACCEPTABLE FOR SPUTUM CULTURE  Final   Report Status 08/08/2016 FINAL  Final  Culture, respiratory (NON-Expectorated)     Status: None   Collection Time: 08/08/16  4:02 AM  Result Value Ref Range Status   Specimen Description EXPECTORATED SPUTUM  Final   Special Requests NONE Reflexed from [id number]  Final   Gram Stain   Final    FEW SQUAMOUS EPITHELIAL CELLS PRESENT FEW WBC PRESENT, PREDOMINANTLY MONONUCLEAR RARE GRAM POSITIVE COCCI IN PAIRS    Culture Consistent with normal respiratory flora.  Final   Report Status 08/10/2016 FINAL  Final      Radiology Studies: Ir Fluoro Guide Cv Line Right  Result Date: 08/15/2016 CLINICAL DATA:  Renal failure, needs durable venous access for hemodialysis EXAM: TUNNELED HEMODIALYSIS CATHETER PLACEMENT WITH ULTRASOUND AND FLUOROSCOPIC GUIDANCE TECHNIQUE: The procedure, risks, benefits, and alternatives were explained to the patient. Questions regarding the procedure were encouraged  and answered. The patient understands and consents to the procedure. As antibiotic prophylaxis, cefazolin 2 g was ordered pre-procedure and administered intravenously within one hour of incision.Patency of the right IJ vein was confirmed with ultrasound with image documentation. An appropriate skin site was determined. Region was prepped using maximum barrier technique including cap and mask, sterile gown, sterile gloves, large sterile sheet, and Chlorhexidine as cutaneous antisepsis. The region was infiltrated locally with 1% lidocaine. Intravenous Fentanyl and Versed were administered as conscious sedation during continuous monitoring of the patient's level of consciousness and physiological / cardiorespiratory status by the radiology RN, with a total moderate sedation time of 15 minutes. Under real-time ultrasound guidance, the right IJ vein was accessed with a 21 gauge micropuncture needle; the needle tip within the vein was confirmed with ultrasound image documentation. Needle exchanged over the 018 guidewire for transitional  dilator, which allowed advancement of a Benson wire into the IVC. Over this, an MPA catheter was advanced. A Palindrome 23 hemodialysis catheter was tunneled from the right anterior chest wall approach to the right IJ dermatotomy site. The MPA catheter was exchanged over an Amplatz wire for serial vascular dilators which allow placement of a peel-away sheath, through which the catheter was advanced under intermittent fluoroscopy, positioned with its tips in the proximal and midright atrium. Spot chest radiograph confirms good catheter position. No pneumothorax. Catheter was flushed and primed per protocol. Catheter secured externally with O Prolene sutures. The right IJ dermatotomy site was closed with Dermabond. COMPLICATIONS: COMPLICATIONS None immediate FLUOROSCOPY TIME:  0.2 minute (86 uGym2 DAP) COMPARISON:  None IMPRESSION: 1. Technically successful placement of tunneled right IJ  hemodialysis catheter with ultrasound and fluoroscopic guidance. Ready for routine use. ACCESS: Remains approachable for percutaneous intervention as needed. Electronically Signed   By: Lucrezia Europe M.D.   On: 08/15/2016 15:45   Ir US Guide Vasc Access Right  Result Date: 08/15/2016 CLINICAL DATA:  Renal failure, needs durable venous access for hemodialysis EXAM: TUNNELED HEMODIALYSIS CATHETER PLACEMENT WITH ULTRASOUND AND FLUOROSCOPIC GUIDANCE TECHNIQUE: The procedure, risks, benefits, and alternatives were explained to the patient. Questions regarding the procedure were encouraged and answered. The patient understands and consents to the procedure. As antibiotic prophylaxis, cefazolin 2 g was ordered pre-procedure and administered intravenously within one hour of incision.Patency of the right IJ vein was confirmed with ultrasound with image documentation. An appropriate skin site was determined. Region was prepped using maximum barrier technique including cap and mask, sterile gown, sterile gloves, large sterile sheet, and Chlorhexidine as cutaneous antisepsis. The region was infiltrated locally with 1% lidocaine. Intravenous Fentanyl and Versed were administered as conscious sedation during continuous monitoring of the patient's level of consciousness and physiological / cardiorespiratory status by the radiology RN, with a total moderate sedation time of 15 minutes. Under real-time ultrasound guidance, the right IJ vein was accessed with a 21 gauge micropuncture needle; the needle tip within the vein was confirmed with ultrasound image documentation. Needle exchanged over the 018 guidewire for transitional dilator, which allowed advancement of a Benson wire into the IVC. Over this, an MPA catheter was advanced. A Palindrome 23 hemodialysis catheter was tunneled from the right anterior chest wall approach to the right IJ dermatotomy site. The MPA catheter was exchanged over an Amplatz wire for serial vascular  dilators which allow placement of a peel-away sheath, through which the catheter was advanced under intermittent fluoroscopy, positioned with its tips in the proximal and midright atrium. Spot chest radiograph confirms good catheter position. No pneumothorax. Catheter was flushed and primed per protocol. Catheter secured externally with O Prolene sutures. The right IJ dermatotomy site was closed with Dermabond. COMPLICATIONS: COMPLICATIONS None immediate FLUOROSCOPY TIME:  0.2 minute (86 uGym2 DAP) COMPARISON:  None IMPRESSION: 1. Technically successful placement of tunneled right IJ hemodialysis catheter with ultrasound and fluoroscopic guidance. Ready for routine use. ACCESS: Remains approachable for percutaneous intervention as needed. Electronically Signed   By: Lucrezia Europe M.D.   On: 08/15/2016 15:45     Medications:  Scheduled: . atorvastatin  20 mg Oral q1800  . darbepoetin (ARANESP) injection - DIALYSIS  60 mcg Intravenous Q Sat-HD  . ipratropium-albuterol  3 mL Nebulization BID  . metoprolol tartrate  25 mg Oral BID  . nicotine  21 mg Transdermal Daily  . predniSONE  60 mg Oral Q breakfast  . sodium chloride flush  3 mL Intravenous Q12H  . tacrolimus  3 mg Oral BID   Continuous:  NKN:[id number] **OR** acetaminophen, albuterol, hydrALAZINE, ondansetron **OR** ondansetron (ZOFRAN) IV, zolpidem  Assessment/Plan:  Principal Problem:   Anasarca Active Problems:   Tobacco abuse   Essential hypertension   AKI (acute kidney injury) ([place])   Dyspnea    Anasarca Secondary to nephrotic syndrome Patient's echocardiogram shows normal systolic function. LFTs are normal. No history of liver disease. No history of alcohol intake. UA does show significant proteinuria. She was on high dose of Lasix, switched to 100 mg of Demadex, urine output dropped, weight increased. Back on high dose of Lasix, volume is not controlled despite high doses, per nephrology will need HD. Continue prednisone  and Prograf. No dialysis today.  AKI (acute kidney injury)/nephrotic syndrome No previously known history of kidney disease. She does have a history of hypertension but is on only one medication at home. Followed regularly by her primary care provider. Patient underwent renal biopsy on 3/26. Her results are pending. Currently on torsemide and metolazone. Nephrology is following. Rise in creatinine noted. SPEP does not show any monoclonal protein.  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 This is now changing into ESRD, currently on dialysis.  Dyspnea Dyspnea was likely due to fluid overload. Chest x-ray did not show any pulmonary edema or infiltrates. She was given diuretics. Dyspnea has improved slightly. She was given ceftriaxone and azithromycin in the emergency Department. WBC is normal. She was noted to have low-grade fever. Could have some form of upper respiratory tract infection.  Completed 8 days course of azithromycin.  Essential hypertension  Continue amlodipine and metoprolol. Blood pressure has improved. Continue to monitor for now.  Tobacco abuse Patient was counseled. Nicotine patch.    DVT Prophylaxis: Lovenox    Code Status: Full code  Family Communication: Discussed with the patient Disposition Plan: Management as outlined above.    LOS: 10 days   [facility]  [facility] Pager [phone] 08/17/2016, 11:14 AM  If 7PM-7AM, please contact night-coverage at [email or web address], password [facility]

## 2016-08-17 NOTE — Progress Notes (Signed)
[facility] Rounding Note  Subjective:    s/p HD #2 on 3/31 with 3 more liters off (6 kg down total) Well tolerated and feels much better, still some SOB  Put out 1000 cc urine yesterday SPONTANEOUSLY!!  Daughter in with her today - course of hosp reviewed with her    Objective Vital signs in last 24 hours: Vitals:   08/16/16 2049 08/16/16 2110 08/17/16 0513 08/17/16 1008  BP: 133/67 134/61 132/65   Pulse: 85 86 71   Resp: 17 17 17    Temp: 99 F (37.2 C) 98.2 F (36.8 C) 98.9 F (37.2 C)   TempSrc: Oral Oral Oral   SpO2: 94% 98% 95% 95%  Weight:      Height:       Weight change: -2.4 kg (-5 lb 4.7 oz)  Intake/Output Summary (Last 24 hours) at 08/17/16 1031 Last data filed at 08/16/16 2111  Gross per 24 hour  Intake              118 ml  Output             4000 ml  Net            -3882 ml    Physical Exam: Very nice older AAF Weight down from 197-->184 post HD X2 Comfortable/NAD VS as noted JVD still present but down 5 cm or so Anteriorly coarse BS, some wheezing Abd no focal tenderness 2+ LE edema still to hips but some improvement   Recent Labs Lab 08/16/16 0213 08/16/16 0939 08/17/16 0151  NA 138 137 135  K 4.0 3.7 3.8  CL 102 100* 99*  CO2 26 24 27   GLUCOSE 105* 129* 102*  BUN 66* 69* 44*  CREATININE 3.03* 3.26* 2.64*  CALCIUM 7.4* 7.7* 7.7*  PHOS 5.9* 5.6* 4.0    Recent Labs Lab 08/15/16 1601 08/16/16 0213 08/16/16 0939 08/17/16 0151  ALT 35  --   --   --   ALBUMIN  --  2.4* 2.5* 2.5*    Recent Labs Lab 08/12/16 0422 08/13/16 0237 08/15/16 0651 08/16/16 0938  WBC 6.4 7.2 16.1* 12.5*  HGB 11.9* 11.0* 10.8* 10.5*  HCT 36.8 33.7* 32.3* 31.4*  MCV 97.6 95.7 93.4 94.3  PLT 309 313 323 317    Scheduled Medications: . atorvastatin  20 mg Oral q1800  . darbepoetin (ARANESP) injection - DIALYSIS  60 mcg Intravenous Q Sat-HD  . ipratropium-albuterol  3 mL Nebulization BID  . metoprolol tartrate  25 mg Oral BID  . nicotine  21 mg  Transdermal Daily  . predniSONE  60 mg Oral Q breakfast  . sodium chloride flush  3 mL Intravenous Q12H  . tacrolimus  3 mg Oral BID   Assessment/Plan:   71 year old black female with very little past medical history presenting with what appears to be nephrotic syndrome with some acute kidney injury. Outpt eval notable for neg HIV, neg ANA, Hep serologies all neg, UPC ?>20 gm proteinuria, UA with 6-10 RBC's. Kidneys 12.4 cm, normal echogenicity. SPEP no M-spike. Renal bx 08/11/16 -> collapsing FSGS.  Failed ceiling doses of diuretics and with rapidly rising creatinine ->dialysis initiated 08/15/16 via [place] placed by IR.   1. Nephrotic syndrome with AKI 2/2 biopsy ->collapsing FSGS ( very aggressive; no proven treatments; some random case reports of successful rx with steroids, prograf so started those).  Rapid loss GFR and failed ceiling doses of diuretics. [place] placed in IR 3/30 and s/p HD X 2 with 6 kg  total off. Albumin for BP support and to mobilize 3rd spp fluids. Initiated steroids and prograf on 3/29. Trend labs. Follow [facility]OP. Reassess dialysis need in AM. MADE A LITER OF URINE SPONTANEOUSLY PAST 24 HOURS - MOST SHE HAS MADE SINCE ADMISSION 2. HTN/volume - had no response to high dose lasix/metolazone. Dialysis/vol off. Stopped diuretics. On less BP meds 3. Anemia  - Hb drifted 12->10.5. TSat 25% but total serum iron only 29. s/p Feraheme X 1. Added low dose of Aranesp. No heparin with HD (or otherwise) for at least a month post renal biopsy 4. DVT prophylaxis - stopped Lovenox (for procedure, and b/c just had renal bx so bleeding risk). SCD's  (Lives in [place] - reassured her that if chronic HD required could go to our [facility])  Jamal Maes, MD [place] Pager 08/17/2016, 10:31 AM

## 2016-08-18 LAB — RENAL FUNCTION PANEL
Albumin: 2.2 g/dL — ABNORMAL LOW (ref 3.5–5.0)
Anion gap: 7 (ref 5–15)
BUN: 69 mg/dL — ABNORMAL HIGH (ref 6–20)
CO2: 27 mmol/L (ref 22–32)
Calcium: 7.8 mg/dL — ABNORMAL LOW (ref 8.9–10.3)
Chloride: 102 mmol/L (ref 101–111)
Creatinine, Ser: 2.73 mg/dL — ABNORMAL HIGH (ref 0.44–1.00)
GFR calc Af Amer: 19 mL/min — ABNORMAL LOW
GFR calc non Af Amer: 17 mL/min — ABNORMAL LOW
Glucose, Bld: 106 mg/dL — ABNORMAL HIGH (ref 65–99)
Phosphorus: 4.2 mg/dL (ref 2.5–4.6)
Potassium: 3.6 mmol/L (ref 3.5–5.1)
Sodium: 136 mmol/L (ref 135–145)

## 2016-08-18 MED ORDER — FUROSEMIDE 80 MG PO TABS
160.0000 mg | ORAL_TABLET | Freq: Three times a day (TID) | ORAL | Status: DC
  Administered 2016-08-18 – 2016-08-20 (×7): 160 mg via ORAL
  Filled 2016-08-18: qty 4
  Filled 2016-08-18 (×8): qty 2

## 2016-08-18 MED ORDER — RENA-VITE PO TABS
1.0000 | ORAL_TABLET | Freq: Every day | ORAL | Status: DC
  Administered 2016-08-18 – 2016-08-19 (×2): 1 via ORAL
  Filled 2016-08-18 (×2): qty 1

## 2016-08-18 NOTE — Progress Notes (Signed)
[facility] PROGRESS NOTE  Mary Chapman WHQ:[id number] DOB: 1945/09/22 DOA: 08/07/2016  PCP: [facility]  Subjective/Interval History: Denies complaints this morning, output not charted that she reported she urinated 3-4 times yesterday. I appreciate nephrology's help, hold on dialysis for now restarted on Lasix.  Brief History/Interval Summary: 71 year old African-American female with a past medical history of hypertension who presented with complaints of shortness of breath and leg swelling. Her symptoms started about a week ago with leg swelling. She was given diuretics by her primary care provider with no improvement. She subsequently decided to come into the hospital. She was found to have elevated creatinine. She was hospitalized for further management.  Reason for Visit: Nephrotic syndrome  Consultants: Nephrology. Interventional radiology.  Procedures:   Transthoracic echocardiogram Study Conclusions  - Left ventricle: The cavity size was normal. Systolic function was   vigorous. The estimated ejection fraction was in the range of 65%   to 70%. Wall motion was normal; there were no regional wall   motion abnormalities. Doppler parameters are consistent with   abnormal left ventricular relaxation (grade 1 diastolic   dysfunction). There was no evidence of elevated ventricular   filling pressure by Doppler parameters. - Aortic valve: There was no regurgitation. - Aortic root: The aortic root was normal in size. - Mitral valve: There was mild regurgitation. - Right ventricle: Systolic function was normal. - Right atrium: The atrium was normal in size. - Tricuspid valve: There was mild regurgitation. - Pulmonic valve: There was no regurgitation. - Pulmonary arteries: Systolic pressure was within the normal   range. - Inferior vena cava: The vessel was dilated. The respirophasic   diameter changes were blunted (< 50%), consistent with elevated   central  venous pressure. - Pericardium, extracardiac: There was no pericardial effusion.  Impressions: - Consider non-cardiac causes for anasarca and dyspnea.  Renal biopsy 3/26  Antibiotics: None   ROS: Denies any nausea or vomiting.  Objective:  Vital Signs  Vitals:   08/17/16 2051 08/18/16 0418 08/18/16 0731 08/18/16 0956  BP: 134/68 (!) 148/77  135/70  Pulse: 79 67  72  Resp: 17 17    Temp: 98.7 F (37.1 C) 98.5 F (36.9 C)    TempSrc: Oral Oral    SpO2: 94% 96% 92%   Weight:      Height:        Intake/Output Summary (Last 24 hours) at 08/18/16 1132 Last data filed at 08/18/16 1000  Gross per 24 hour  Intake              300 ml  Output              600 ml  Net             -300 ml   Filed Weights   08/16/16 0427 08/16/16 0900 08/16/16 1244  Weight: 86.5 kg (190 lb 12.8 oz) 87.2 kg (192 lb 3.9 oz) 83.8 kg (184 lb 11.9 oz)    General appearance: alert, cooperative, appears stated age and no distress Resp: Worsening breath sounds today. Few crackles at the bases. Mild tachypnea.   Cardio: regular rate and rhythm, S1, S2 normal, no murmur, click, rub or gallop GI: soft, non-tender; bowel sounds normal; no masses,  no organomegaly Extremities: Pitting edema noted bilateral lower extremities. Hasn't had any significant improvement over the last 2 days. Neurologic: No focal deficits.  Lab Results:  Data Reviewed: I have personally reviewed following labs and imaging studies  CBC:  Recent Labs Lab 08/12/16 0422 08/13/16 0237 08/15/16 0651 08/16/16 0938  WBC 6.4 7.2 16.1* 12.5*  HGB 11.9* 11.0* 10.8* 10.5*  HCT 36.8 33.7* 32.3* 31.4*  MCV 97.6 95.7 93.4 94.3  PLT 309 313 323 782    Basic Metabolic Panel:  Recent Labs Lab 08/15/16 0224 08/16/16 0213 08/16/16 0939 08/17/16 0151 08/18/16 0159  NA 138 138 137 135 136  K 3.9 4.0 3.7 3.8 3.6  CL 104 102 100* 99* 102  CO2 24 26 24 27 27   GLUCOSE 150* 105* 129* 102* 106*  BUN 79* 66* 69* 44* 69*    CREATININE 3.39* 3.03* 3.26* 2.64* 2.73*  CALCIUM 7.7* 7.4* 7.7* 7.7* 7.8*  PHOS 6.6* 5.9* 5.6* 4.0 4.2    GFR: Estimated Creatinine Clearance: 20.9 mL/min (A) (by C-G formula based on SCr of 2.73 mg/dL (H)).  Liver Function Tests:  Recent Labs Lab 08/15/16 0224 08/15/16 1601 08/16/16 0213 08/16/16 0939 08/17/16 0151 08/18/16 0159  ALT  --  35  --   --   --   --   ALBUMIN 1.8*  --  2.4* 2.5* 2.5* 2.2*    Coagulation Profile: No results for input(s): INR, PROTIME in the last 168 hours.  Cardiac Enzymes: No results for input(s): CKTOTAL, CKMB, CKMBINDEX, TROPONINI in the last 168 hours.  CBG: No results for input(s): GLUCAP in the last 168 hours.  Lipid Profile: No results for input(s): CHOL, HDL, LDLCALC, TRIG, CHOLHDL, LDLDIRECT in the last 72 hours.   No results found for this or any previous visit (from the past 240 hour(s)).    Radiology Studies: No results found.   Medications:  Scheduled: . atorvastatin  20 mg Oral q1800  . darbepoetin (ARANESP) injection - DIALYSIS  60 mcg Intravenous Q Sat-HD  . furosemide  160 mg Oral TID  . ipratropium-albuterol  3 mL Nebulization BID  . metoprolol tartrate  25 mg Oral BID  . multivitamin  1 tablet Oral QHS  . nicotine  21 mg Transdermal Daily  . predniSONE  60 mg Oral Q breakfast  . sodium chloride flush  3 mL Intravenous Q12H  . tacrolimus  3 mg Oral BID   Continuous:  UMP:[id number] **OR** acetaminophen, albuterol, hydrALAZINE, ondansetron **OR** ondansetron (ZOFRAN) IV, zolpidem  Assessment/Plan:  Principal Problem:   Anasarca Active Problems:   Tobacco abuse   Essential hypertension   AKI (acute kidney injury) ([place])   Dyspnea    Anasarca Secondary to nephrotic syndrome Patient's echo shows normal systolic function. LFTs are normal. No history of liver disease. No history of alcohol intake.  UA does show significant proteinuria. Continue prednisone and Prograf. No dialysis today. Diuretics  restarted.  AKI (acute kidney injury)/nephrotic syndrome No previously known history of kidney disease. She does have a history of hypertension but is on only one medication at home. Followed regularly by her primary care provider. Patient underwent renal biopsy on 3/26. Her results are pending. Currently on torsemide and metolazone. Nephrology is following. Rise in creatinine noted. SPEP does not show any monoclonal protein.  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  Dyspnea Dyspnea was likely due to fluid overload. Chest x-ray did not show any pulmonary edema or infiltrates. She was given diuretics. Dyspnea has improved slightly. She was given ceftriaxone and azithromycin in the emergency Department. WBC is normal. She was noted to have low-grade fever. Could have some form of upper respiratory tract infection.  Completed 8 days course of azithromycin.  Essential hypertension  Continue amlodipine and metoprolol. Blood pressure has improved. Continue to monitor for now.  Tobacco abuse Patient was counseled. Nicotine patch.    DVT Prophylaxis: Lovenox    Code Status: Full code  Family Communication: Discussed with the patient Disposition Plan: Management as outlined above.    LOS: 11 days   [facility] Pager [phone] 08/18/2016, 11:32 AM  If 7PM-7AM, please contact night-coverage at [email or web address], password [facility]

## 2016-08-18 NOTE — Progress Notes (Signed)
Subjective: Interval History: voided x 4 yest, and once this am.  Fair amts  Objective: Vital signs in last 24 hours: Temp:  [98.5 F (36.9 C)-98.7 F (37.1 C)] 98.5 F (36.9 C) (04/02 0418) Pulse Rate:  [67-79] 67 (04/02 0418) Resp:  [17] 17 (04/02 0418) BP: (134-148)/(68-77) 148/77 (04/02 0418) SpO2:  [92 %-96 %] 92 % (04/02 0731) Weight change:   Intake/Output from previous day: 04/01 0701 - 04/02 0700 In: 300 [P.O.:300] Out: -  Intake/Output this shift: No intake/output data recorded.  General appearance: alert, cooperative, no distress and mildly obese Resp: rales bibasilar Cardio: S1, S2 normal and systolic murmur: holosystolic 2/6, blowing at apex GI: obese, pos bs, liver down 5 cm Extremities: edema 4+  Lab Results:  Recent Labs  08/16/16 0938  WBC 12.5*  HGB 10.5*  HCT 31.4*  PLT 317   BMET:  Recent Labs  08/17/16 0151 08/18/16 0159  NA 135 136  K 3.8 3.6  CL 99* 102  CO2 27 27  GLUCOSE 102* 106*  BUN 44* 69*  CREATININE 2.64* 2.73*  CALCIUM 7.7* 7.8*   No results for input(s): PTH in the last 72 hours. Iron Studies: No results for input(s): IRON, TIBC, TRANSFERRIN, FERRITIN in the last 72 hours.  Studies/Results: No results found.  I have reviewed the patient's current medications.  Assessment/Plan: 1 AKI with collapsing FSG. Presumably ATN.  Now making urine ?vol.  Will restart Lasix and see if can get by with out HD. IF not HD in am 2 Anemia check Fe 3 FSG 4 Edema 5 HTn  P Lasix , follow vol,     LOS: 11 days   Camron Essman L 08/18/2016,8:39 AM

## 2016-08-18 NOTE — Care Management Important Message (Signed)
Important Message  Patient Details  Name: Mary Chapman MRN: [id number] Date of Birth: 1946-03-31   Medicare Important Message Given:  Yes    Orbie Pyo 08/18/2016, 4:05 PM

## 2016-08-19 LAB — CBC
HEMATOCRIT: 32.1 % — AB (ref 36.0–46.0)
HEMOGLOBIN: 10.6 g/dL — AB (ref 12.0–15.0)
MCH: 31.1 pg (ref 26.0–34.0)
MCHC: 33 g/dL (ref 30.0–36.0)
MCV: 94.1 fL (ref 78.0–100.0)
Platelets: 287 10*3/uL (ref 150–400)
RBC: 3.41 MIL/uL — ABNORMAL LOW (ref 3.87–5.11)
RDW: 13.1 % (ref 11.5–15.5)
WBC: 13.7 10*3/uL — ABNORMAL HIGH (ref 4.0–10.5)

## 2016-08-19 LAB — RENAL FUNCTION PANEL
ALBUMIN: 2 g/dL — AB (ref 3.5–5.0)
Anion gap: 7 (ref 5–15)
BUN: 69 mg/dL — AB (ref 6–20)
CO2: 27 mmol/L (ref 22–32)
CREATININE: 1.93 mg/dL — AB (ref 0.44–1.00)
Calcium: 8.1 mg/dL — ABNORMAL LOW (ref 8.9–10.3)
Chloride: 104 mmol/L (ref 101–111)
GFR calc Af Amer: 29 mL/min — ABNORMAL LOW (ref >60)
GFR, EST NON AFRICAN AMERICAN: 25 mL/min — AB (ref >60)
GLUCOSE: 111 mg/dL — AB (ref 65–99)
PHOSPHORUS: 4.2 mg/dL (ref 2.5–4.6)
Potassium: 3.2 mmol/L — ABNORMAL LOW (ref 3.5–5.1)
SODIUM: 138 mmol/L (ref 135–145)

## 2016-08-19 MED ORDER — POTASSIUM CHLORIDE CRYS ER 20 MEQ PO TBCR
40.0000 meq | EXTENDED_RELEASE_TABLET | Freq: Two times a day (BID) | ORAL | Status: DC
  Administered 2016-08-19 – 2016-08-20 (×3): 40 meq via ORAL
  Filled 2016-08-19 (×3): qty 2

## 2016-08-19 MED ORDER — ENOXAPARIN SODIUM 30 MG/0.3ML ~~LOC~~ SOLN
30.0000 mg | SUBCUTANEOUS | Status: DC
  Administered 2016-08-19: 30 mg via SUBCUTANEOUS
  Filled 2016-08-19: qty 0.3

## 2016-08-19 MED ORDER — METOLAZONE 5 MG PO TABS
5.0000 mg | ORAL_TABLET | Freq: Every day | ORAL | Status: DC
  Administered 2016-08-19 – 2016-08-20 (×2): 5 mg via ORAL
  Filled 2016-08-19 (×2): qty 1

## 2016-08-19 NOTE — Progress Notes (Signed)
Subjective: Interval History: has no complaint , feels much better.  Objective: Vital signs in last 24 hours: Temp:  [98.1 F (36.7 C)-98.3 F (36.8 C)] 98.3 F (36.8 C) (04/03 0550) Pulse Rate:  [71-73] 73 (04/03 0550) Resp:  [18] 18 (04/03 0550) BP: (135-151)/(70-90) 150/90 (04/03 0550) SpO2:  [94 %-98 %] 98 % (04/03 0550) Weight:  [82.7 kg (182 lb 6.4 oz)] 82.7 kg (182 lb 6.4 oz) (04/03 0550) Weight change:   Intake/Output from previous day: 04/02 0701 - 04/03 0700 In: 600 [P.O.:600] Out: 1500 [Urine:1500] Intake/Output this shift: No intake/output data recorded.  General appearance: alert, cooperative and no distress Resp: diminished breath sounds bilaterally Chest wall: RIJ PC Cardio: S1, S2 normal and systolic murmur: holosystolic 2/6, blowing at apex GI: pos bs, liver down 6 cm Extremities: edema 3+  Lab Results:  Recent Labs  08/16/16 0938 08/19/16 0327  WBC 12.5* 13.7*  HGB 10.5* 10.6*  HCT 31.4* 32.1*  PLT 317 287   BMET:  Recent Labs  08/18/16 0159 08/19/16 0327  NA 136 138  K 3.6 3.2*  CL 102 104  CO2 27 27  GLUCOSE 106* 111*  BUN 69* 69*  CREATININE 2.73* 1.93*  CALCIUM 7.8* 8.1*   No results for input(s): PTH in the last 72 hours. Iron Studies: No results for input(s): IRON, TIBC, TRANSFERRIN, FERRITIN in the last 72 hours.  Studies/Results: No results found.  I have reviewed the patient's current medications.  Assessment/Plan: 1 AKI/CKD diuresing, Cr falling off HD.  Suspect had ATN with FSG and now better.  Will cont off HD, and cont diuretics 2 FSG  Prograf/Pred consider anticoag 3 HTN lower 4 Anemia esa P lasix, metol, K, Prograf, pred    LOS: 12 days   Maicy Filip L 08/19/2016,8:32 AM

## 2016-08-19 NOTE — Progress Notes (Signed)
[facility] PROGRESS NOTE  Mary Chapman IRC:[id number] DOB: [birth year] DOA: 08/07/2016  PCP: [facility]  Subjective/Interval History: She is feeling much better, 1.5 L of urine output charted for yesterday. Her creatinine is improving as well.  Brief History/Interval Summary: 71 year old African-American female with a past medical history of hypertension who presented with complaints of shortness of breath and leg swelling. Her symptoms started about a week ago with leg swelling. She was given diuretics by her primary care provider with no improvement. She subsequently decided to come into the hospital. She was found to have elevated creatinine. Treated initially with high-dose of Lasix, then she underwent 2 sessions of dialysis, currently off of dialysis her creatinine is improving and started to make urine, will follow nephrology recommendation  Reason for Visit: Nephrotic syndrome  Consultants: Nephrology. Interventional radiology.  Procedures:   Transthoracic echocardiogram Study Conclusions  - Left ventricle: The cavity size was normal. Systolic function was   vigorous. The estimated ejection fraction was in the range of 65%   to 70%. Wall motion was normal; there were no regional wall   motion abnormalities. Doppler parameters are consistent with   abnormal left ventricular relaxation (grade 1 diastolic   dysfunction). There was no evidence of elevated ventricular   filling pressure by Doppler parameters. - Aortic valve: There was no regurgitation. - Aortic root: The aortic root was normal in size. - Mitral valve: There was mild regurgitation. - Right ventricle: Systolic function was normal. - Right atrium: The atrium was normal in size. - Tricuspid valve: There was mild regurgitation. - Pulmonic valve: There was no regurgitation. - Pulmonary arteries: Systolic pressure was within the normal   range. - Inferior vena cava: The vessel was dilated. The  respirophasic   diameter changes were blunted (< 50%), consistent with elevated   central venous pressure. - Pericardium, extracardiac: There was no pericardial effusion.  Impressions: - Consider non-cardiac causes for anasarca and dyspnea.  Renal biopsy 3/26  Antibiotics: None   ROS: Denies any nausea or vomiting.  Objective:  Vital Signs  Vitals:   08/18/16 2026 08/19/16 0550 08/19/16 0845 08/19/16 0954  BP: (!) 151/71 (!) 150/90  (!) 141/72  Pulse: 73 73  71  Resp: 18 18    Temp: 98.1 F (36.7 C) 98.3 F (36.8 C)    TempSrc: Oral Oral    SpO2: 94% 98% 95%   Weight:  82.7 kg (182 lb 6.4 oz)    Height:        Intake/Output Summary (Last 24 hours) at 08/19/16 1141 Last data filed at 08/19/16 0800  Gross per 24 hour  Intake              600 ml  Output              900 ml  Net             -300 ml   Filed Weights   08/16/16 0900 08/16/16 1244 08/19/16 0550  Weight: 87.2 kg (192 lb 3.9 oz) 83.8 kg (184 lb 11.9 oz) 82.7 kg (182 lb 6.4 oz)    General appearance: alert, cooperative, appears stated age and no distress Resp: Worsening breath sounds today. Few crackles at the bases. Mild tachypnea.   Cardio: regular rate and rhythm, S1, S2 normal, no murmur, click, rub or gallop GI: soft, non-tender; bowel sounds normal; no masses,  no organomegaly Extremities: Pitting edema noted bilateral lower extremities. Hasn't had any significant improvement over the last  2 days. Neurologic: No focal deficits.  Lab Results:  Data Reviewed: I have personally reviewed following labs and imaging studies  CBC:  Recent Labs Lab 08/13/16 0237 08/15/16 0651 08/16/16 0938 08/19/16 0327  WBC 7.2 16.1* 12.5* 13.7*  HGB 11.0* 10.8* 10.5* 10.6*  HCT 33.7* 32.3* 31.4* 32.1*  MCV 95.7 93.4 94.3 94.1  PLT 313 323 317 295    Basic Metabolic Panel:  Recent Labs Lab 08/16/16 0213 08/16/16 0939 08/17/16 0151 08/18/16 0159 08/19/16 0327  NA 138 137 135 136 138  K 4.0 3.7 3.8  3.6 3.2*  CL 102 100* 99* 102 104  CO2 26 24 27 27 27   GLUCOSE 105* 129* 102* 106* 111*  BUN 66* 69* 44* 69* 69*  CREATININE 3.03* 3.26* 2.64* 2.73* 1.93*  CALCIUM 7.4* 7.7* 7.7* 7.8* 8.1*  PHOS 5.9* 5.6* 4.0 4.2 4.2    GFR: Estimated Creatinine Clearance: 29.4 mL/min (A) (by C-G formula based on SCr of 1.93 mg/dL (H)).  Liver Function Tests:  Recent Labs Lab 08/15/16 1601 08/16/16 0213 08/16/16 0939 08/17/16 0151 08/18/16 0159 08/19/16 0327  ALT 35  --   --   --   --   --   ALBUMIN  --  2.4* 2.5* 2.5* 2.2* 2.0*    Coagulation Profile: No results for input(s): INR, PROTIME in the last 168 hours.  Cardiac Enzymes: No results for input(s): CKTOTAL, CKMB, CKMBINDEX, TROPONINI in the last 168 hours.  CBG: No results for input(s): GLUCAP in the last 168 hours.  Lipid Profile: No results for input(s): CHOL, HDL, LDLCALC, TRIG, CHOLHDL, LDLDIRECT in the last 72 hours.   No results found for this or any previous visit (from the past 240 hour(s)).    Radiology Studies: No results found.   Medications:  Scheduled: . atorvastatin  20 mg Oral q1800  . darbepoetin (ARANESP) injection - DIALYSIS  60 mcg Intravenous Q Sat-HD  . furosemide  160 mg Oral TID  . ipratropium-albuterol  3 mL Nebulization BID  . metolazone  5 mg Oral Daily  . metoprolol tartrate  25 mg Oral BID  . multivitamin  1 tablet Oral QHS  . nicotine  21 mg Transdermal Daily  . potassium chloride  40 mEq Oral BID  . predniSONE  60 mg Oral Q breakfast  . sodium chloride flush  3 mL Intravenous Q12H  . tacrolimus  3 mg Oral BID   Continuous:  AOZ:HYQMVHQIONGEX **OR** acetaminophen, albuterol, hydrALAZINE, ondansetron **OR** ondansetron (ZOFRAN) IV, zolpidem  Assessment/Plan:  Principal Problem:   Anasarca Active Problems:   Tobacco abuse   Essential hypertension   AKI (acute kidney injury) ([place])   Dyspnea    Anasarca Secondary to nephrotic syndrome Patient's echo shows normal systolic  function. LFTs are normal. No history of liver disease. No history of alcohol intake.  UA does show significant proteinuria. Continue prednisone and Prograf. Had HD catheter in place, 2 sessions of dialysis on March 30/31, currently off of dialysis. On Lasix 160 mg TID and metolazone per nephrology, urine output improving and creatinine as well.  AKI (acute kidney injury)/nephrotic syndrome No previously known history of kidney disease. She does have a history of hypertension but is on only one medication at home. Followed regularly by her primary care provider. Patient underwent renal biopsy on 3/26.  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  Dyspnea Dyspnea was likely due to fluid overload. Chest x-ray did not show any pulmonary edema or infiltrates. She  was given diuretics. Dyspnea has improved slightly. She was given ceftriaxone and azithromycin in the emergency Department. WBC is normal. She was noted to have low-grade fever. Could have some form of upper respiratory tract infection.  Completed 8 days course of azithromycin.  Essential hypertension  Continue amlodipine and metoprolol. Blood pressure has improved. Continue to monitor for now.  Tobacco abuse Patient was counseled. Nicotine patch.    DVT Prophylaxis: Lovenox    Code Status: Full code  Family Communication: Discussed with the patient Disposition Plan: Management as outlined above.    LOS: 12 days   [facility] Pager [phone] 08/19/2016, 11:41 AM  If 7PM-7AM, please contact night-coverage at [email or web address], password [facility]

## 2016-08-20 DIAGNOSIS — N049 Nephrotic syndrome with unspecified morphologic changes: Secondary | ICD-10-CM

## 2016-08-20 LAB — RENAL FUNCTION PANEL
ALBUMIN: 1.9 g/dL — AB (ref 3.5–5.0)
ANION GAP: 7 (ref 5–15)
BUN: 68 mg/dL — AB (ref 6–20)
CHLORIDE: 108 mmol/L (ref 101–111)
CO2: 26 mmol/L (ref 22–32)
Calcium: 8.1 mg/dL — ABNORMAL LOW (ref 8.9–10.3)
Creatinine, Ser: 1.87 mg/dL — ABNORMAL HIGH (ref 0.44–1.00)
GFR calc Af Amer: 30 mL/min — ABNORMAL LOW (ref >60)
GFR calc non Af Amer: 26 mL/min — ABNORMAL LOW (ref >60)
GLUCOSE: 122 mg/dL — AB (ref 65–99)
PHOSPHORUS: 3.6 mg/dL (ref 2.5–4.6)
POTASSIUM: 3.8 mmol/L (ref 3.5–5.1)
Sodium: 141 mmol/L (ref 135–145)

## 2016-08-20 MED ORDER — METOLAZONE 5 MG PO TABS: 5.0000 mg | ORAL_TABLET | Freq: Every day | ORAL | 30 refills | Status: DC

## 2016-08-20 MED ORDER — POTASSIUM CHLORIDE CRYS ER 20 MEQ PO TBCR: 40.0000 meq | EXTENDED_RELEASE_TABLET | Freq: Two times a day (BID) | ORAL | 0 refills | Status: DC

## 2016-08-20 MED ORDER — PREDNISONE 20 MG PO TABS: 60.0000 mg | ORAL_TABLET | Freq: Every day | ORAL | 0 refills | Status: DC

## 2016-08-20 MED ORDER — FUROSEMIDE 80 MG PO TABS: 160.0000 mg | ORAL_TABLET | Freq: Three times a day (TID) | ORAL | 0 refills | Status: DC

## 2016-08-20 MED ORDER — ATORVASTATIN CALCIUM 20 MG PO TABS: 20.0000 mg | ORAL_TABLET | Freq: Every day | ORAL | Status: DC

## 2016-08-20 MED ORDER — METOPROLOL TARTRATE 25 MG PO TABS: 25.0000 mg | ORAL_TABLET | Freq: Two times a day (BID) | ORAL | 60 refills | Status: DC

## 2016-08-20 MED ORDER — TACROLIMUS 1 MG PO CAPS: 3.0000 mg | ORAL_CAPSULE | Freq: Two times a day (BID) | ORAL | 0 refills | Status: DC

## 2016-08-20 MED ORDER — NICOTINE 21 MG/24HR TD PT24: 21.0000 mg | MEDICATED_PATCH | Freq: Every day | TRANSDERMAL | 0 refills | Status: DC

## 2016-08-20 MED ORDER — RENA-VITE PO TABS: 1.0000 | ORAL_TABLET | Freq: Every day | ORAL | 3 refills | Status: DC

## 2016-08-20 NOTE — Progress Notes (Signed)
Subjective: Interval History: has complaints wants to go home.  Objective: Vital signs in last 24 hours: Temp:  [98.2 F (36.8 C)-98.3 F (36.8 C)] 98.3 F (36.8 C) (04/04 0554) Pulse Rate:  [71-78] 73 (04/04 0554) Resp:  [18] 18 (04/04 0554) BP: (141-156)/(66-96) 156/96 (04/04 0554) SpO2:  [93 %-98 %] 94 % (04/04 0723) Weight:  [82.6 kg (182 lb)] 82.6 kg (182 lb) (04/04 0554) Weight change: -0.181 kg (-6.4 oz)  Intake/Output from previous day: 04/03 0701 - 04/04 0700 In: 840 [P.O.:840] Out: 1551 [Urine:1550; Stool:1] Intake/Output this shift: No intake/output data recorded.  General appearance: alert, cooperative and no distress Resp: diminished breath sounds bilaterally Chest wall: RIJ PC Cardio: S1, S2 normal and systolic murmur: holosystolic 2/6, blowing at apex GI: pos bs, liver down 6 cm Extremities: edema 3-4+  Lab Results:  Recent Labs  08/19/16 0327  WBC 13.7*  HGB 10.6*  HCT 32.1*  PLT 287   BMET:  Recent Labs  08/19/16 0327 08/20/16 0401  NA 138 141  K 3.2* 3.8  CL 104 108  CO2 27 26  GLUCOSE 111* 122*  BUN 69* 68*  CREATININE 1.93* 1.87*  CALCIUM 8.1* 8.1*   No results for input(s): PTH in the last 72 hours. Iron Studies: No results for input(s): IRON, TIBC, TRANSFERRIN, FERRITIN in the last 72 hours.  Studies/Results: No results found.  I have reviewed the patient's current medications.  Assessment/Plan: 1AKI/ anasarca FSG with ATN.  Improving vol and Cr.  Will cont diuretics and f/u outpatient. 2 FSG  On Prograf /Pred 3 Anemia 4 Smoking off P cont diuretics, f/u outpatient in 5-7 D , diet, cont Prograf and Pred.  Keep PC in , care in Short stay, remove if ok in 2 wk    LOS: 13 days   Josalin Carneiro L 08/20/2016,8:36 AM

## 2016-08-20 NOTE — Care Management Note (Signed)
Case Management Note Marvetta Gibbons RN, BSN Unit 2W-Case Manager [phone]  Patient Details  Name: Mary Chapman MRN: [id number] Date of Birth: 1945-12-21  Subjective/Objective:  Pt admitted with anasarca, nephrotic syndrome- s/p renal bx                  Action/Plan: PTA pt lived at home- independent, plan to return home- CM to follow  Expected Discharge Date:  08/20/16               Expected Discharge Plan:  Home/Self Care  In-House Referral:     Discharge planning Services  CM Consult  Post Acute Care Choice:  NA Choice offered to:  NA  DME Arranged:    DME Agency:     HH Arranged:    [place] Agency:     Status of Service:  In process, will continue to follow  If discussed at Long Length of Stay Meetings, dates discussed:  4/3  Discharge Disposition: home/self care   Additional Comments:  08/20/16- 1130- Marvetta Gibbons RN, CM- pt for d/c home today- no CM needs noted for discharge.   Dawayne Patricia, RN 08/20/2016, 11:36 AM

## 2016-08-20 NOTE — Discharge Summary (Signed)
Physician Discharge Summary  Mary Chapman TDD:[id number] DOB: 03/06/1946 DOA: 08/07/2016  PCP: [place] date: 08/07/2016 Discharge date: 08/20/2016  Admitted From: Home Discharge disposition: Home   Recommendations for Outpatient Follow-Up:   1. The patient has close follow-up scheduled with nephrology. Repeat labs will be drawn within one week including a renal profile. She will follow-up at the day center for care of her dialysis catheter.   Discharge Diagnosis:   Principal Problem:    AKI (acute kidney injury) ([name]) Active Problems:    Anasarca    Tobacco abuse    Essential hypertension    Dyspnea    Nephrotic syndrome  Discharge Condition: Improved.  Diet recommendation: Low sodium, heart healthy.    History of Present Illness:   Mary Chapman is an 71 y.o. female with a past medical history of hypertension who was admitted 08/07/16 for evaluation of a one-week history of complaints of shortness of breath and leg swelling. She was given diuretics by her primary care provider with no improvement. She subsequently decided to come into the hospital. She was found to have elevated creatinine. Treated initially with high-dose of Lasix, then she underwent 2 sessions of dialysis, currently off of dialysis her creatinine is improving and started to make urine, will follow nephrology recommendation.   Hospital Course by Problem:   Principal Problem:   AKI (acute kidney injury) ([name]) with anasarca/nephrotic syndrome secondary to collapsing FSG with ATN Creatinine was 1.9 on admission. Baseline creatinine was 0.9 in August 2017. Albumin was also 1.9 on admission. Noted to have nephrotic range proteinuria and nephrology consultation suggested percutaneous kidney biopsy which was done 08/11/16 (showed collapsing FSGS) and also recommended a serologic workup including hepatitis, HIV, and ANA studies. Anasarca was treated with high-dose Lasix. She did undergo  hemodialysis 2. ATN with FSG is the current working diagnosis. Placed on Prograf/prednisone per nephrology recommendations, And close follow-up arranged.  Active Problems:   Tobacco abuse Counseled, continue nicotine patch.    Essential hypertension Continue amlodipine and metoprolol.    Dyspnea Secondary to volume overload secondary to acute renal process. Improved with dialysis and diuretics.    Medical Consultants:    Nephrology  Interventional Radiology   Discharge Exam:   Vitals:   08/20/16 0554 08/20/16 1017  BP: (!) 156/96 135/71  Pulse: 73 70  Resp: 18   Temp: 98.3 F (36.8 C)    Vitals:   08/19/16 2153 08/20/16 0554 08/20/16 0723 08/20/16 1017  BP: (!) 156/74 (!) 156/96  135/71  Pulse: 74 73  70  Resp: 18 18    Temp: 98.2 F (36.8 C) 98.3 F (36.8 C)    TempSrc: Oral Oral    SpO2: 96% 98% 94%   Weight:  82.6 kg (182 lb)    Height:        General exam: Appears calm and comfortable.  Respiratory system: Clear to auscultation. Respiratory effort normal. Cardiovascular system: S1 & S2 heard, RRR. No JVD,  rubs, gallops or clicks. No murmurs. Gastrointestinal system: Abdomen is nondistended, soft and nontender. No organomegaly or masses felt. Normal bowel sounds heard. Central nervous system: Alert and oriented. No focal neurological deficits. Extremities: No clubbing,  or cyanosis. 3+ edema. Skin: No rashes, lesions or ulcers. Psychiatry: Judgement and insight appear normal. Mood & affect appropriate.    The results of significant diagnostics from this hospitalization (including imaging, microbiology, ancillary and laboratory) are listed below for reference.     Procedures and Diagnostic Studies:  Ct Abdomen Pelvis Wo Contrast  Result Date: 08/07/2016 CLINICAL DATA:  Swelling in the bilateral lower extremities with chest congestion EXAM: CT CHEST, ABDOMEN AND PELVIS WITHOUT CONTRAST TECHNIQUE: Multidetector CT imaging of the chest, abdomen and  pelvis was performed following the standard protocol without IV contrast. COMPARISON:  Chest x-ray 08/07/2016 FINDINGS: CT CHEST FINDINGS Cardiovascular: Limited evaluation without intravenous contrast. Atherosclerotic calcifications. Non aneurysmal aorta. Coronary artery calcifications. The heart is nonenlarged. Trace pericardial effusion. Mediastinum/Nodes: Midline trachea. Thyroid grossly normal. The esophagus is within normal limits. Lungs/Pleura: Mild to moderate emphysema. Small right-sided pleural effusion with suspected infiltrate in the right lower lobe. No pneumothorax. Musculoskeletal: No acute or suspicious bone lesion. CT ABDOMEN PELVIS FINDINGS Hepatobiliary: No focal hepatic abnormality. Calcified gallstones. No biliary dilatation Pancreas: Unremarkable. No pancreatic ductal dilatation or surrounding inflammatory changes. Spleen: Normal in size without focal abnormality. Adrenals/Urinary Tract: Adrenal glands are within normal limits. No hydronephrosis. Two adjacent stones in the mid left kidney, both measuring 6 mm. The bladder is unremarkable. Stomach/Bowel: Stomach is within normal limits. Appendix appears normal. No evidence of bowel wall thickening, distention, or inflammatory changes. Numerous colon diverticula without acute inflammation. Vascular/Lymphatic: Aortic atherosclerosis. No enlarged abdominal or pelvic lymph nodes. Reproductive: Uterus and bilateral adnexa are unremarkable. Other: Small amount of free fluid within the pelvis. Musculoskeletal: No acute or suspicious bone lesion. Degenerative changes. Mild diffuse subcutaneous edema. IMPRESSION: 1. Small right-sided pleural effusion with suspected right lower lobe pneumonia. 2. Gallstones 3. Nonobstructing stones in the left kidney 4. Colon diverticular disease without acute inflammation 5. Small amount of free fluid in the pelvis. Mild diffuse subcutaneous edema. Electronically Signed   By: Donavan Foil M.D.   On: 08/07/2016 21:11    Dg Chest 2 View  Result Date: 08/07/2016 CLINICAL DATA:  Acute onset of shortness of breath, cough and chest congestion. Nausea. Bilateral lower extremity swelling. Initial encounter. EXAM: CHEST  2 VIEW COMPARISON:  None. FINDINGS: The lungs are well-aerated. A small right pleural effusion is noted. Mild bibasilar opacities could reflect minimal interstitial edema. No pneumothorax is seen. The heart is normal in size; the mediastinal contour is within normal limits. No acute osseous abnormalities are seen. IMPRESSION: Small right pleural effusion. Mild bibasilar opacities could reflect minimal interstitial edema. Electronically Signed   By: Garald Balding M.D.   On: 08/07/2016 18:50   Ct Chest Wo Contrast  Result Date: 08/07/2016 CLINICAL DATA:  Swelling in the bilateral lower extremities with chest congestion EXAM: CT CHEST, ABDOMEN AND PELVIS WITHOUT CONTRAST TECHNIQUE: Multidetector CT imaging of the chest, abdomen and pelvis was performed following the standard protocol without IV contrast. COMPARISON:  Chest x-ray 08/07/2016 FINDINGS: CT CHEST FINDINGS Cardiovascular: Limited evaluation without intravenous contrast. Atherosclerotic calcifications. Non aneurysmal aorta. Coronary artery calcifications. The heart is nonenlarged. Trace pericardial effusion. Mediastinum/Nodes: Midline trachea. Thyroid grossly normal. The esophagus is within normal limits. Lungs/Pleura: Mild to moderate emphysema. Small right-sided pleural effusion with suspected infiltrate in the right lower lobe. No pneumothorax. Musculoskeletal: No acute or suspicious bone lesion. CT ABDOMEN PELVIS FINDINGS Hepatobiliary: No focal hepatic abnormality. Calcified gallstones. No biliary dilatation Pancreas: Unremarkable. No pancreatic ductal dilatation or surrounding inflammatory changes. Spleen: Normal in size without focal abnormality. Adrenals/Urinary Tract: Adrenal glands are within normal limits. No hydronephrosis. Two adjacent stones  in the mid left kidney, both measuring 6 mm. The bladder is unremarkable. Stomach/Bowel: Stomach is within normal limits. Appendix appears normal. No evidence of bowel wall thickening, distention, or inflammatory changes. Numerous colon diverticula without  acute inflammation. Vascular/Lymphatic: Aortic atherosclerosis. No enlarged abdominal or pelvic lymph nodes. Reproductive: Uterus and bilateral adnexa are unremarkable. Other: Small amount of free fluid within the pelvis. Musculoskeletal: No acute or suspicious bone lesion. Degenerative changes. Mild diffuse subcutaneous edema. IMPRESSION: 1. Small right-sided pleural effusion with suspected right lower lobe pneumonia. 2. Gallstones 3. Nonobstructing stones in the left kidney 4. Colon diverticular disease without acute inflammation 5. Small amount of free fluid in the pelvis. Mild diffuse subcutaneous edema. Electronically Signed   By: Donavan Foil M.D.   On: 08/07/2016 21:11   US Renal  Result Date: 08/08/2016 CLINICAL DATA:  Acute renal injury. EXAM: RENAL / URINARY TRACT ULTRASOUND COMPLETE COMPARISON:  CT abdomen dated 08/07/2016. FINDINGS: Right Kidney: Length: 12.4 cm. Echogenicity within normal limits. No mass or hydronephrosis visualized. Left Kidney: Length: 12.4 cm. Echogenicity within normal limits. No mass or hydronephrosis visualized. 4 mm left renal stone, compatible with 1 of the 2 similar size stones seen within the left renal pelvis on a recent CT. Bladder: Bladder is decompressed. IMPRESSION: 1. Left nephrolithiasis. Left kidney is otherwise unremarkable. No hydronephrosis. 2. Right kidney appears normal. 3. Bladder is decompressed. Electronically Signed   By: Franki Cabot M.D.   On: 08/08/2016 12:23     Labs:   Basic Metabolic Panel:  Recent Labs Lab 08/16/16 0939 08/17/16 0151 08/18/16 0159 08/19/16 0327 08/20/16 0401  NA 137 135 136 138 141  K 3.7 3.8 3.6 3.2* 3.8  CL 100* 99* 102 104 108  CO2 24 27 27 27 26   GLUCOSE  129* 102* 106* 111* 122*  BUN 69* 44* 69* 69* 68*  CREATININE 3.26* 2.64* 2.73* 1.93* 1.87*  CALCIUM 7.7* 7.7* 7.8* 8.1* 8.1*  PHOS 5.6* 4.0 4.2 4.2 3.6   GFR Estimated Creatinine Clearance: 30.3 mL/min (A) (by C-G formula based on SCr of 1.87 mg/dL (H)). Liver Function Tests:  Recent Labs Lab 08/15/16 1601  08/16/16 0939 08/17/16 0151 08/18/16 0159 08/19/16 0327 08/20/16 0401  ALT 35  --   --   --   --   --   --   ALBUMIN  --   < > 2.5* 2.5* 2.2* 2.0* 1.9*  < > = values in this interval not displayed. No results for input(s): LIPASE, AMYLASE in the last 168 hours. No results for input(s): AMMONIA in the last 168 hours. Coagulation profile No results for input(s): INR, PROTIME in the last 168 hours.  CBC:  Recent Labs Lab 08/15/16 0651 08/16/16 0938 08/19/16 0327  WBC 16.1* 12.5* 13.7*  HGB 10.8* 10.5* 10.6*  HCT 32.3* 31.4* 32.1*  MCV 93.4 94.3 94.1  PLT 323 317 287   Cardiac Enzymes: No results for input(s): CKTOTAL, CKMB, CKMBINDEX, TROPONINI in the last 168 hours. BNP: Invalid input(s): POCBNP CBG: No results for input(s): GLUCAP in the last 168 hours. D-Dimer No results for input(s): DDIMER in the last 72 hours. Hgb A1c No results for input(s): HGBA1C in the last 72 hours. Lipid Profile No results for input(s): CHOL, HDL, LDLCALC, TRIG, CHOLHDL, LDLDIRECT in the last 72 hours. Thyroid function studies No results for input(s): TSH, T4TOTAL, T3FREE, THYROIDAB in the last 72 hours.  Invalid input(s): FREET3 Anemia work up No results for input(s): VITAMINB12, FOLATE, FERRITIN, TIBC, IRON, RETICCTPCT in the last 72 hours. Microbiology No results found for this or any previous visit (from the past 240 hour(s)).   Discharge Instructions:   Discharge Instructions    Call MD for:  extreme fatigue  Complete by:  As directed    Call MD for:  persistant dizziness or light-headedness    Complete by:  As directed    Call MD for:  persistant nausea and  vomiting    Complete by:  As directed    Diet - low sodium heart healthy    Complete by:  As directed    Limit fluids to less than 1.5 liters.   Increase activity slowly    Complete by:  As directed      Allergies as of 08/20/2016   No Known Allergies     Medication List    STOP taking these medications   hydrochlorothiazide 25 MG tablet Commonly known as:  HYDRODIURIL     TAKE these medications   atorvastatin 20 MG tablet Commonly known as:  LIPITOR Take 1 tablet (20 mg total) by mouth daily at 6 PM.   furosemide 80 MG tablet Commonly known as:  LASIX Take 2 tablets (160 mg total) by mouth 3 (three) times daily. What changed:  medication strength  how much to take  when to take this   metolazone 5 MG tablet Commonly known as:  ZAROXOLYN Take 1 tablet (5 mg total) by mouth daily. Start taking on:  08/21/2016   metoprolol tartrate 25 MG tablet Commonly known as:  LOPRESSOR Take 1 tablet (25 mg total) by mouth 2 (two) times daily.   multivitamin Tabs tablet Take 1 tablet by mouth at bedtime.   nicotine 21 mg/24hr patch Commonly known as:  NICODERM CQ - dosed in mg/24 hours Place 1 patch (21 mg total) onto the skin daily.   potassium chloride SA 20 MEQ tablet Commonly known as:  K-DUR,KLOR-CON Take 2 tablets (40 mEq total) by mouth 2 (two) times daily.   predniSONE 20 MG tablet Commonly known as:  DELTASONE Take 3 tablets (60 mg total) by mouth daily with breakfast. Start taking on:  08/21/2016   tacrolimus 1 MG capsule Commonly known as:  PROGRAF Take 3 capsules (3 mg total) by mouth 2 (two) times daily.      Follow-up Information    DETERDING,JAMES L, MD. Schedule an appointment as soon as possible for a visit in 1 week(s).   Specialty:  Nephrology Contact information: [place] [address] [phone]        [facility]. Schedule an appointment as soon as possible for a visit in 2 week(s).   Contact information: [place] [phone]            Time coordinating discharge: 35 minutes.  Signed:  Augustus Zurawski  Pager [phone] [facility] 08/20/2016, 4:07 PM

## 2016-09-03 ENCOUNTER — Encounter (HOSPITAL_COMMUNITY): Payer: Self-pay

## 2016-09-05 ENCOUNTER — Encounter (HOSPITAL_COMMUNITY): Payer: Self-pay

## 2016-09-23 ENCOUNTER — Emergency Department (HOSPITAL_COMMUNITY): Payer: Medicare Other

## 2016-09-23 ENCOUNTER — Emergency Department (HOSPITAL_COMMUNITY)
Admission: EM | Admit: 2016-09-23 | Discharge: 2016-09-23 | Disposition: A | Discharge status: Home / Self Care | Payer: Medicare Other | Attending: Emergency Medicine | Admitting: Emergency Medicine

## 2016-09-23 ENCOUNTER — Encounter (HOSPITAL_COMMUNITY): Payer: Self-pay | Admitting: Emergency Medicine

## 2016-09-23 DIAGNOSIS — R55 Syncope and collapse: Secondary | ICD-10-CM | POA: Diagnosis present

## 2016-09-23 DIAGNOSIS — E876 Hypokalemia: Secondary | ICD-10-CM

## 2016-09-23 DIAGNOSIS — F17210 Nicotine dependence, cigarettes, uncomplicated: Secondary | ICD-10-CM | POA: Diagnosis not present

## 2016-09-23 DIAGNOSIS — Z79899 Other long term (current) drug therapy: Secondary | ICD-10-CM | POA: Diagnosis not present

## 2016-09-23 DIAGNOSIS — R42 Dizziness and giddiness: Secondary | ICD-10-CM | POA: Insufficient documentation

## 2016-09-23 DIAGNOSIS — I10 Essential (primary) hypertension: Secondary | ICD-10-CM | POA: Insufficient documentation

## 2016-09-23 HISTORY — DX: Unspecified nephritic syndrome with focal and segmental glomerular lesions: N05.1

## 2016-09-23 LAB — URINALYSIS, ROUTINE W REFLEX MICROSCOPIC
Bilirubin Urine: NEGATIVE
GLUCOSE, UA: 150 mg/dL — AB
Hgb urine dipstick: NEGATIVE
Ketones, ur: NEGATIVE mg/dL
LEUKOCYTES UA: NEGATIVE
Nitrite: NEGATIVE
Specific Gravity, Urine: 1.015 (ref 1.005–1.030)
pH: 7 (ref 5.0–8.0)

## 2016-09-23 LAB — CBC WITH DIFFERENTIAL/PLATELET
Basophils Absolute: 0 10*3/uL (ref 0.0–0.1)
Basophils Relative: 0 %
EOS PCT: 1 %
Eosinophils Absolute: 0.1 10*3/uL (ref 0.0–0.7)
HEMATOCRIT: 37.4 % (ref 36.0–46.0)
HEMOGLOBIN: 12.3 g/dL (ref 12.0–15.0)
LYMPHS ABS: 1.4 10*3/uL (ref 0.7–4.0)
LYMPHS PCT: 16 %
MCH: 32.2 pg (ref 26.0–34.0)
MCHC: 32.9 g/dL (ref 30.0–36.0)
MCV: 97.9 fL (ref 78.0–100.0)
Monocytes Absolute: 0.4 10*3/uL (ref 0.1–1.0)
Monocytes Relative: 5 %
NEUTROS ABS: 6.8 10*3/uL (ref 1.7–7.7)
Neutrophils Relative %: 78 %
PLATELETS: 174 10*3/uL (ref 150–400)
RBC: 3.82 MIL/uL — AB (ref 3.87–5.11)
RDW: 14.6 % (ref 11.5–15.5)
WBC: 8.8 10*3/uL (ref 4.0–10.5)

## 2016-09-23 LAB — COMPREHENSIVE METABOLIC PANEL
ALBUMIN: 1.8 g/dL — AB (ref 3.5–5.0)
ALT: 24 U/L (ref 14–54)
AST: 27 U/L (ref 15–41)
Alkaline Phosphatase: 47 U/L (ref 38–126)
Anion gap: 9 (ref 5–15)
BUN: 22 mg/dL — AB (ref 6–20)
CHLORIDE: 98 mmol/L — AB (ref 101–111)
CO2: 32 mmol/L (ref 22–32)
Calcium: 7.8 mg/dL — ABNORMAL LOW (ref 8.9–10.3)
Creatinine, Ser: 1.53 mg/dL — ABNORMAL HIGH (ref 0.44–1.00)
GFR calc Af Amer: 39 mL/min — ABNORMAL LOW (ref >60)
GFR, EST NON AFRICAN AMERICAN: 33 mL/min — AB (ref >60)
GLUCOSE: 144 mg/dL — AB (ref 65–99)
POTASSIUM: 2.7 mmol/L — AB (ref 3.5–5.1)
Sodium: 139 mmol/L (ref 135–145)
Total Bilirubin: 0.4 mg/dL (ref 0.3–1.2)
Total Protein: 4.9 g/dL — ABNORMAL LOW (ref 6.5–8.1)

## 2016-09-23 LAB — APTT: APTT: 31 s (ref 24–36)

## 2016-09-23 LAB — PROTIME-INR
INR: 1.04
Prothrombin Time: 13.7 seconds (ref 11.4–15.2)

## 2016-09-23 LAB — TROPONIN I: Troponin I: <0.03 ng/mL (ref <0.03)

## 2016-09-23 MED ORDER — SODIUM CHLORIDE 0.9 % IV SOLN
100.0000 mL/h | INTRAVENOUS | Status: DC
  Administered 2016-09-23: 100 mL/h via INTRAVENOUS

## 2016-09-23 MED ORDER — SODIUM CHLORIDE 0.9 % IV BOLUS (SEPSIS)
500.0000 mL | Freq: Once | INTRAVENOUS | Status: AC
  Administered 2016-09-23: 500 mL via INTRAVENOUS

## 2016-09-23 MED ORDER — MECLIZINE HCL 25 MG PO TABS: 25.0000 mg | ORAL_TABLET | Freq: Once | ORAL | Status: DC

## 2016-09-23 MED ORDER — POTASSIUM CHLORIDE CRYS ER 20 MEQ PO TBCR
60.0000 meq | EXTENDED_RELEASE_TABLET | Freq: Once | ORAL | Status: AC
  Administered 2016-09-23: 60 meq via ORAL
  Filled 2016-09-23: qty 3

## 2016-09-23 NOTE — ED Triage Notes (Addendum)
Patient from doctors office for near syncope, patient states she was seated on exam table when she suddenly felt very weak.  Facility reports systolic BP of 70 during episode.  Patient normotensive on EMS arrival to doctor's office.  No complaints at this time, patient alert and oriented and in no apparent distress. Non-pitting edema to bilateral lower extremities, patient states present edema is better than normal.

## 2016-09-23 NOTE — Discharge Instructions (Signed)
As discussed, your evaluation today has been largely reassuring.  But, it is important that you monitor your condition carefully, and do not hesitate to return to the ED if you develop new, or concerning changes in your condition.  Otherwise, please follow-up with your physician for appropriate ongoing care.  Please be sure to have your electrolytes checked within the next 2 or 3 days.

## 2016-09-23 NOTE — ED Notes (Signed)
Pt ambulated well to the RR 

## 2016-09-23 NOTE — ED Provider Notes (Signed)
[facility] DEPT Provider Note   CSN: [id number] Arrival date & time: 09/23/16  0950     History   Chief Complaint Chief Complaint  Patient presents with  . Near Syncope    HPI Mary Chapman is a 71 y.o. female.  HPI Patient presents with after an episode of near-syncope.  She acknowledges multiple medical issues including ongoing therapy for nephrotic syndrome for which she takes prednisone, Prograf. Today the patient was at a evaluation with her nephrologist. Patient recalls being flushed, warm, lightheaded, almost losing consciousness. She recovered with no complete syncope, no pain, and on my evaluation states that she feels better, both in regards to earlier today, as well as in regards to her overall condition. She denies any ongoing pain anywhere, lightheadedness, fever, nausea. No recent medication changes. I discussed the patient's event with her nephrologist via telephone prior to arrival of the patient. Reportedly the patient was also hypotensive during the episode.   Past Medical History:  Diagnosis Date  . Focal segmental glomerulosclerosis   . Hypertension   . Tobacco abuse     Patient Active Problem List   Diagnosis Date Noted  . Nephrotic syndrome 08/20/2016  . Essential hypertension 08/08/2016  . Dyspnea 08/08/2016  . Tobacco abuse   . AKI (acute kidney injury) ([place])   . Anasarca 08/07/2016    Past Surgical History:  Procedure Laterality Date  . IR GENERIC HISTORICAL  08/15/2016   IR US GUIDE VASC ACCESS RIGHT 08/15/2016 Arne Cleveland, MD [facility]  . IR GENERIC HISTORICAL  08/15/2016   IR FLUORO GUIDE CV LINE RIGHT 08/15/2016 Arne Cleveland, MD [facility]  . TUBAL LIGATION      OB History    No data available       Home Medications    Prior to Admission medications   Medication Sig Start Date End Date Taking? Authorizing Provider  atorvastatin (LIPITOR) 20 MG tablet Take 1 tablet (20 mg total) by mouth daily at 6 PM. 08/20/16  Yes  Rama, Venetia Maxon, MD  furosemide (LASIX) 80 MG tablet Take 2 tablets (160 mg total) by mouth 3 (three) times daily. 08/20/16  Yes Rama, Venetia Maxon, MD  metolazone (ZAROXOLYN) 5 MG tablet Take 1 tablet (5 mg total) by mouth daily. 08/21/16  Yes Rama, Venetia Maxon, MD  metoprolol tartrate (LOPRESSOR) 25 MG tablet Take 1 tablet (25 mg total) by mouth 2 (two) times daily. 08/20/16  Yes Rama, Venetia Maxon, MD  multivitamin (RENA-VIT) TABS tablet Take 1 tablet by mouth at bedtime. 08/20/16  Yes Rama, Venetia Maxon, MD  nicotine (NICODERM CQ - DOSED IN MG/24 HOURS) 21 mg/24hr patch Place 1 patch (21 mg total) onto the skin daily. 08/20/16  Yes Rama, Venetia Maxon, MD  potassium chloride SA (K-DUR,KLOR-CON) 20 MEQ tablet Take 2 tablets (40 mEq total) by mouth 2 (two) times daily. 08/20/16  Yes Rama, Venetia Maxon, MD  predniSONE (DELTASONE) 20 MG tablet Take 3 tablets (60 mg total) by mouth daily with breakfast. 08/21/16  Yes Rama, Venetia Maxon, MD  rosuvastatin (CRESTOR) 20 MG tablet Take 20 mg by mouth daily. 09/11/16  Yes [provider]  tacrolimus (PROGRAF) 1 MG capsule Take 3 capsules (3 mg total) by mouth 2 (two) times daily. 08/20/16  Yes Rama, Venetia Maxon, MD    Family History Family History  Problem Relation Age of Onset  . Hypertension Mother   . Diabetes Mother   . Hypertension Father   . Diabetes Father   . Hypertension  Sister   . Hypertension Brother     Social History Social History  Substance Use Topics  . Smoking status: Current Some Day Smoker    Packs/day: 0.50    Types: Cigarettes  . Smokeless tobacco: Never Used  . Alcohol use No     Allergies   Patient has no known allergies.   Review of Systems Review of Systems  Constitutional:       Per HPI, otherwise negative  HENT:       Per HPI, otherwise negative  Respiratory:       Per HPI, otherwise negative  Cardiovascular:       Per HPI, otherwise negative  Gastrointestinal: Negative for vomiting.  Endocrine:       Negative  aside from HPI  Genitourinary:       Neg aside from HPI   Musculoskeletal:       Per HPI, otherwise negative  Skin: Negative.   Allergic/Immunologic: Positive for immunocompromised state.  Neurological: Positive for light-headedness. Negative for syncope.     Physical Exam Updated Vital Signs BP 124/72   Pulse 88   Temp 99.4 F (37.4 C) (Oral)   Resp 19   SpO2 96%   Physical Exam  Constitutional: She is oriented to person, place, and time. She appears well-developed and well-nourished. No distress.  HENT:  Head: Normocephalic and atraumatic.  Eyes: Conjunctivae and EOM are normal.  Cardiovascular: Normal rate and regular rhythm.   Pulmonary/Chest: Effort normal and breath sounds normal. No stridor. No respiratory distress.  Abdominal: She exhibits no distension.  Musculoskeletal:  Diffuse, mild LE edema - patient notes that is slightly better than it has been  Neurological: She is alert and oriented to person, place, and time. No cranial nerve deficit.  Skin: Skin is warm and dry.  Psychiatric: She has a normal mood and affect.  Nursing note and vitals reviewed.    ED Treatments / Results  Labs (all labs ordered are listed, but only abnormal results are displayed) Labs Reviewed  COMPREHENSIVE METABOLIC PANEL - Abnormal; Notable for the following:       Result Value   Potassium 2.7 (*)    Chloride 98 (*)    Glucose, Bld 144 (*)    BUN 22 (*)    Creatinine, Ser 1.53 (*)    Calcium 7.8 (*)    Total Protein 4.9 (*)    Albumin 1.8 (*)    GFR calc non Af Amer 33 (*)    GFR calc Af Amer 39 (*)    All other components within normal limits  CBC WITH DIFFERENTIAL/PLATELET - Abnormal; Notable for the following:    RBC 3.82 (*)    All other components within normal limits  URINALYSIS, ROUTINE W REFLEX MICROSCOPIC - Abnormal; Notable for the following:    APPearance HAZY (*)    Glucose, UA 150 (*)    Protein, ur >=300 (*)    Bacteria, UA FEW (*)    Squamous Epithelial  / LPF 0-5 (*)    All other components within normal limits  APTT  TROPONIN I  PROTIME-INR    EKG  EKG Interpretation  Date/Time:  Tuesday Sep 23 2016 09:55:29 EDT Ventricular Rate:  91 PR Interval:    QRS Duration: 88 QT Interval:  361 QTC Calculation: 445 R Axis:   48 Text Interpretation:  Sinus rhythm Ventricular premature complex T wave abnormality Artifact Abnormal ekg Confirmed by Carmin Muskrat  MD ([id number]) on 09/23/2016 10:39:22 AM  Radiology Dg Chest 2 View  Result Date: 09/23/2016 CLINICAL DATA:  Dyspnea and near syncopal episode today. EXAM: CHEST  2 VIEW COMPARISON:  None. FINDINGS: The heart size and mediastinal contours are within normal limits. Both lungs are clear. The visualized skeletal structures are unremarkable. IMPRESSION: No active cardiopulmonary disease. Electronically Signed   By: Earle Gell M.D.   On: 09/23/2016 11:24    Procedures Procedures (including critical care time)  Medications Ordered in ED Medications  sodium chloride 0.9 % bolus 500 mL (500 mLs Intravenous New Bag/Given 09/23/16 1053)    Followed by  0.9 %  sodium chloride infusion (not administered)     Initial Impression / Assessment and Plan / ED Course  I have reviewed the triage vital signs and the nursing notes.  Pertinent labs & imaging results that were available during my care of the patient were reviewed by me and considered in my medical decision making (see chart for details).     Initial labs notable for hypokalemia.  12:22 PM Patient awake and alert, states that she feels better, and after lengthy conversation about all findings, including generally reassuring results, with no evidence for ongoing arrhythmia, ACS, no evidence for infection, pneumonia, and with repletion of her hypokalemia, the patient was discharged in stable condition to follow-up with her primary care physician within the next 2 or 3 days for repeat laboratory evaluation, follow-up.    Final  Clinical Impressions(s) / ED Diagnoses  Near-syncope Hypokalemia   Carmin Muskrat, MD 09/23/16 1223

## 2017-03-10 ENCOUNTER — Other Ambulatory Visit: Payer: Self-pay

## 2017-03-10 DIAGNOSIS — N185 Chronic kidney disease, stage 5: Secondary | ICD-10-CM

## 2017-03-10 DIAGNOSIS — Z01810 Encounter for preprocedural cardiovascular examination: Secondary | ICD-10-CM

## 2017-03-14 ENCOUNTER — Encounter (HOSPITAL_BASED_OUTPATIENT_CLINIC_OR_DEPARTMENT_OTHER): Payer: Self-pay | Admitting: Emergency Medicine

## 2017-03-14 ENCOUNTER — Emergency Department (HOSPITAL_BASED_OUTPATIENT_CLINIC_OR_DEPARTMENT_OTHER): Payer: Medicare Other

## 2017-03-14 ENCOUNTER — Inpatient Hospital Stay (HOSPITAL_BASED_OUTPATIENT_CLINIC_OR_DEPARTMENT_OTHER)
Admission: EM | Admit: 2017-03-14 | Discharge: 2017-03-21 | DRG: 674 | Disposition: A | Discharge status: Home / Self Care | Payer: Medicare Other | Attending: Internal Medicine | Admitting: Internal Medicine

## 2017-03-14 DIAGNOSIS — Z9889 Other specified postprocedural states: Secondary | ICD-10-CM

## 2017-03-14 DIAGNOSIS — E8770 Fluid overload, unspecified: Secondary | ICD-10-CM | POA: Diagnosis present

## 2017-03-14 DIAGNOSIS — I120 Hypertensive chronic kidney disease with stage 5 chronic kidney disease or end stage renal disease: Secondary | ICD-10-CM | POA: Diagnosis present

## 2017-03-14 DIAGNOSIS — E785 Hyperlipidemia, unspecified: Secondary | ICD-10-CM

## 2017-03-14 DIAGNOSIS — Z79899 Other long term (current) drug therapy: Secondary | ICD-10-CM

## 2017-03-14 DIAGNOSIS — E669 Obesity, unspecified: Secondary | ICD-10-CM | POA: Diagnosis present

## 2017-03-14 DIAGNOSIS — Z8249 Family history of ischemic heart disease and other diseases of the circulatory system: Secondary | ICD-10-CM

## 2017-03-14 DIAGNOSIS — D539 Nutritional anemia, unspecified: Secondary | ICD-10-CM | POA: Diagnosis present

## 2017-03-14 DIAGNOSIS — E875 Hyperkalemia: Secondary | ICD-10-CM | POA: Diagnosis present

## 2017-03-14 DIAGNOSIS — R748 Abnormal levels of other serum enzymes: Secondary | ICD-10-CM | POA: Diagnosis present

## 2017-03-14 DIAGNOSIS — Z9851 Tubal ligation status: Secondary | ICD-10-CM

## 2017-03-14 DIAGNOSIS — R0600 Dyspnea, unspecified: Secondary | ICD-10-CM | POA: Diagnosis present

## 2017-03-14 DIAGNOSIS — Z09 Encounter for follow-up examination after completed treatment for conditions other than malignant neoplasm: Secondary | ICD-10-CM

## 2017-03-14 DIAGNOSIS — N179 Acute kidney failure, unspecified: Secondary | ICD-10-CM | POA: Diagnosis not present

## 2017-03-14 DIAGNOSIS — Z6833 Body mass index (BMI) 33.0-33.9, adult: Secondary | ICD-10-CM

## 2017-03-14 DIAGNOSIS — Z992 Dependence on renal dialysis: Secondary | ICD-10-CM

## 2017-03-14 DIAGNOSIS — R7989 Other specified abnormal findings of blood chemistry: Secondary | ICD-10-CM

## 2017-03-14 DIAGNOSIS — N049 Nephrotic syndrome with unspecified morphologic changes: Secondary | ICD-10-CM

## 2017-03-14 DIAGNOSIS — I10 Essential (primary) hypertension: Secondary | ICD-10-CM | POA: Diagnosis present

## 2017-03-14 DIAGNOSIS — R0602 Shortness of breath: Secondary | ICD-10-CM

## 2017-03-14 DIAGNOSIS — E8809 Other disorders of plasma-protein metabolism, not elsewhere classified: Secondary | ICD-10-CM | POA: Diagnosis present

## 2017-03-14 DIAGNOSIS — Z452 Encounter for adjustment and management of vascular access device: Secondary | ICD-10-CM

## 2017-03-14 DIAGNOSIS — R601 Generalized edema: Secondary | ICD-10-CM | POA: Diagnosis present

## 2017-03-14 DIAGNOSIS — Z833 Family history of diabetes mellitus: Secondary | ICD-10-CM

## 2017-03-14 DIAGNOSIS — R778 Other specified abnormalities of plasma proteins: Secondary | ICD-10-CM

## 2017-03-14 DIAGNOSIS — F17210 Nicotine dependence, cigarettes, uncomplicated: Secondary | ICD-10-CM | POA: Diagnosis present

## 2017-03-14 DIAGNOSIS — E8889 Other specified metabolic disorders: Secondary | ICD-10-CM | POA: Diagnosis present

## 2017-03-14 DIAGNOSIS — N186 End stage renal disease: Secondary | ICD-10-CM | POA: Diagnosis present

## 2017-03-14 DIAGNOSIS — Z720 Tobacco use: Secondary | ICD-10-CM | POA: Diagnosis present

## 2017-03-14 DIAGNOSIS — N269 Renal sclerosis, unspecified: Secondary | ICD-10-CM | POA: Diagnosis present

## 2017-03-14 DIAGNOSIS — N051 Unspecified nephritic syndrome with focal and segmental glomerular lesions: Secondary | ICD-10-CM | POA: Diagnosis present

## 2017-03-14 DIAGNOSIS — D631 Anemia in chronic kidney disease: Secondary | ICD-10-CM | POA: Diagnosis present

## 2017-03-14 DIAGNOSIS — N189 Chronic kidney disease, unspecified: Secondary | ICD-10-CM

## 2017-03-14 LAB — COMPREHENSIVE METABOLIC PANEL
ALK PHOS: 58 U/L (ref 38–126)
ALT: 11 U/L — AB (ref 14–54)
AST: 19 U/L (ref 15–41)
Albumin: 1.7 g/dL — ABNORMAL LOW (ref 3.5–5.0)
Anion gap: 4 — ABNORMAL LOW (ref 5–15)
BUN: 79 mg/dL — AB (ref 6–20)
CALCIUM: 8 mg/dL — AB (ref 8.9–10.3)
CHLORIDE: 117 mmol/L — AB (ref 101–111)
CO2: 14 mmol/L — AB (ref 22–32)
CREATININE: 4.31 mg/dL — AB (ref 0.44–1.00)
GFR, EST AFRICAN AMERICAN: 11 mL/min — AB (ref >60)
GFR, EST NON AFRICAN AMERICAN: 10 mL/min — AB (ref >60)
Glucose, Bld: 99 mg/dL (ref 65–99)
Potassium: 6.7 mmol/L (ref 3.5–5.1)
SODIUM: 135 mmol/L (ref 135–145)
Total Bilirubin: 0.2 mg/dL — ABNORMAL LOW (ref 0.3–1.2)
Total Protein: 4.7 g/dL — ABNORMAL LOW (ref 6.5–8.1)

## 2017-03-14 LAB — CBC WITH DIFFERENTIAL/PLATELET
BASOS ABS: 0 10*3/uL (ref 0.0–0.1)
Basophils Relative: 0 %
Eosinophils Absolute: 0.2 10*3/uL (ref 0.0–0.7)
Eosinophils Relative: 2 %
HCT: 31 % — ABNORMAL LOW (ref 36.0–46.0)
HEMOGLOBIN: 10 g/dL — AB (ref 12.0–15.0)
LYMPHS ABS: 1.1 10*3/uL (ref 0.7–4.0)
LYMPHS PCT: 11 %
MCH: 32.7 pg (ref 26.0–34.0)
MCHC: 32.3 g/dL (ref 30.0–36.0)
MCV: 101.3 fL — ABNORMAL HIGH (ref 78.0–100.0)
Monocytes Absolute: 0.9 10*3/uL (ref 0.1–1.0)
Monocytes Relative: 9 %
NEUTROS ABS: 8.1 10*3/uL — AB (ref 1.7–7.7)
Neutrophils Relative %: 78 %
Platelets: 279 10*3/uL (ref 150–400)
RBC: 3.06 MIL/uL — AB (ref 3.87–5.11)
RDW: 13.5 % (ref 11.5–15.5)
WBC: 10.3 10*3/uL (ref 4.0–10.5)

## 2017-03-14 LAB — URINALYSIS, ROUTINE W REFLEX MICROSCOPIC
Bilirubin Urine: NEGATIVE
GLUCOSE, UA: 100 mg/dL — AB
Ketones, ur: NEGATIVE mg/dL
Leukocytes, UA: NEGATIVE
Nitrite: NEGATIVE
Protein, ur: >300 mg/dL — AB
SPECIFIC GRAVITY, URINE: 1.02 (ref 1.005–1.030)
pH: 6 (ref 5.0–8.0)

## 2017-03-14 LAB — URINALYSIS, MICROSCOPIC (REFLEX)

## 2017-03-14 LAB — TROPONIN I: TROPONIN I: 0.03 ng/mL — AB (ref <0.03)

## 2017-03-14 LAB — BRAIN NATRIURETIC PEPTIDE: B Natriuretic Peptide: 141.1 pg/mL — ABNORMAL HIGH (ref 0.0–100.0)

## 2017-03-14 MED ORDER — SODIUM CHLORIDE 0.9 % IV SOLN
1.0000 g | Freq: Once | INTRAVENOUS | Status: AC
  Administered 2017-03-14: 1 g via INTRAVENOUS
  Filled 2017-03-14: qty 10

## 2017-03-14 MED ORDER — FUROSEMIDE 10 MG/ML IJ SOLN
INTRAMUSCULAR | Status: AC
  Filled 2017-03-14: qty 16

## 2017-03-14 MED ORDER — FUROSEMIDE 10 MG/ML IJ SOLN
160.0000 mg | Freq: Once | INTRAVENOUS | Status: DC
  Filled 2017-03-14: qty 16

## 2017-03-14 NOTE — ED Triage Notes (Signed)
PT presents with c/o shortness of breath  Since Thursday night. PT thinks her abdomin is swelling with fluid. PT has history of renal failure . Noticeable swelling to feet and ankle.

## 2017-03-14 NOTE — ED Notes (Signed)
Troponin level 0.03 called by Clare Gandy in the lab. Dr. Thomasene Lot aware

## 2017-03-14 NOTE — ED Notes (Signed)
K level is 6.7 per Ted in the lab. Dr. Thomasene Lot aware

## 2017-03-14 NOTE — ED Provider Notes (Signed)
[facility] Provider Note   CSN: [id number] Arrival date & time: 03/14/17  2028     History   Chief Complaint Chief Complaint  Patient presents with  . Shortness of Breath    HPI Mary Chapman is a 71 y.o. female.  HPI  Patient 71 year old female presenting with anasarca.  Patient had increase in swelling.  Patient was recently saw her renal physician Dr. Moshe Salisbury on the 18th of this month.  Was switched from her 80 mg 3 times a day of furosemide 200 mg twice a day of torsemide.  Is noticed increased swelling from her legs all the way into her abdomen.  Has noticed increased shortness of breath.  She is noticed that she is urinating less frequently than usual.  She reports that her renal physician thinks that she may have to go on dialysis soon.  No dietary indiscretions.   Past Medical History:  Diagnosis Date  . Focal segmental glomerulosclerosis   . Hypertension   . Tobacco abuse     Patient Active Problem List   Diagnosis Date Noted  . Nephrotic syndrome 08/20/2016  . Essential hypertension 08/08/2016  . Dyspnea 08/08/2016  . Tobacco abuse   . AKI (acute kidney injury) ([place])   . Anasarca 08/07/2016    Past Surgical History:  Procedure Laterality Date  . IR GENERIC HISTORICAL  08/15/2016   IR US GUIDE VASC ACCESS RIGHT 08/15/2016 Arne Cleveland, MD [facility]  . IR GENERIC HISTORICAL  08/15/2016   IR FLUORO GUIDE CV LINE RIGHT 08/15/2016 Arne Cleveland, MD [facility]  . TUBAL LIGATION      OB History    No data available       Home Medications    Prior to Admission medications   Medication Sig Start Date End Date Taking? Authorizing Provider  atorvastatin (LIPITOR) 20 MG tablet Take 1 tablet (20 mg total) by mouth daily at 6 PM. 08/20/16   Rama, Venetia Maxon, MD  furosemide (LASIX) 80 MG tablet Take 2 tablets (160 mg total) by mouth 3 (three) times daily. 08/20/16   Rama, Venetia Maxon, MD  metolazone (ZAROXOLYN) 5 MG tablet  Take 1 tablet (5 mg total) by mouth daily. 08/21/16   Rama, Venetia Maxon, MD  metoprolol tartrate (LOPRESSOR) 25 MG tablet Take 1 tablet (25 mg total) by mouth 2 (two) times daily. 08/20/16   Rama, Venetia Maxon, MD  multivitamin (RENA-VIT) TABS tablet Take 1 tablet by mouth at bedtime. 08/20/16   Rama, Venetia Maxon, MD  nicotine (NICODERM CQ - DOSED IN MG/24 HOURS) 21 mg/24hr patch Place 1 patch (21 mg total) onto the skin daily. 08/20/16   Rama, Venetia Maxon, MD  potassium chloride SA (K-DUR,KLOR-CON) 20 MEQ tablet Take 2 tablets (40 mEq total) by mouth 2 (two) times daily. 08/20/16   Rama, Venetia Maxon, MD  predniSONE (DELTASONE) 20 MG tablet Take 3 tablets (60 mg total) by mouth daily with breakfast. 08/21/16   Rama, Venetia Maxon, MD  rosuvastatin (CRESTOR) 20 MG tablet Take 20 mg by mouth daily. 09/11/16   [provider]  tacrolimus (PROGRAF) 1 MG capsule Take 3 capsules (3 mg total) by mouth 2 (two) times daily. 08/20/16   Rama, Venetia Maxon, MD    Family History Family History  Problem Relation Age of Onset  . Hypertension Mother   . Diabetes Mother   . Hypertension Father   . Diabetes Father   . Hypertension Sister   . Hypertension Brother  Social History Social History  Substance Use Topics  . Smoking status: Current Some Day Smoker    Packs/day: 0.50    Types: Cigarettes  . Smokeless tobacco: Never Used  . Alcohol use No     Allergies   Patient has no known allergies.   Review of Systems Review of Systems  Constitutional: Positive for fatigue. Negative for activity change and fever.  Respiratory: Positive for shortness of breath.   Cardiovascular: Negative for chest pain.  Gastrointestinal: Negative for abdominal pain.     Physical Exam Updated Vital Signs BP (!) 167/87 (BP Location: Left Arm)   Pulse 79   Temp 98.7 F (37.1 C) (Oral)   Resp (!) 30   SpO2 100%   Physical Exam  Constitutional: She is oriented to person, place, and time. She appears well-developed  and well-nourished.  HENT:  Head: Normocephalic and atraumatic.  Eyes: Right eye exhibits no discharge. Left eye exhibits no discharge.  Cardiovascular: Normal rate.   Pulmonary/Chest: No respiratory distress. She has no wheezes.  Mild crackles bilaterally.  Tachypnea.  Abdominal: Soft. She exhibits no distension. There is no tenderness.  +2 pitting edema to mid abdomen.  Musculoskeletal: She exhibits edema.  +3-4 pitting edema bilateral lower extremity's.  Neurological: She is oriented to person, place, and time.  Skin: Skin is warm and dry. She is not diaphoretic.  Psychiatric: She has a normal mood and affect.  Nursing note and vitals reviewed.    ED Treatments / Results  Labs (all labs ordered are listed, but only abnormal results are displayed) Labs Reviewed  CBC WITH DIFFERENTIAL/PLATELET  COMPREHENSIVE METABOLIC PANEL  BRAIN NATRIURETIC PEPTIDE  URINALYSIS, ROUTINE W REFLEX MICROSCOPIC  TROPONIN I    EKG  EKG Interpretation  Date/Time:  Saturday March 14 2017 20:43:28 EDT Ventricular Rate:  80 PR Interval:  160 QRS Duration: 68 QT Interval:  346 QTC Calculation: 399 R Axis:   36 Text Interpretation:  Normal sinus rhythm t waves more pronounced than previous tracings, question hyperkalemia Reconfirmed by Virgel Manifold [phone]) on 03/14/2017 11:21:58 PM       Radiology Dg Chest 2 View  Result Date: 03/14/2017 CLINICAL DATA:  71 year old female with shortness of breath. EXAM: CHEST  2 VIEW COMPARISON:  Chest radiograph dated 09/23/2016 FINDINGS: Minimal bibasilar densities may represent atelectatic changes. Clinical correlation is recommended. There is no focal consolidation. No large pleural effusion or pneumothorax. The cardiac silhouette is within normal limits. No acute osseous pathology. IMPRESSION: Minimal bibasilar atelectasis.  No focal consolidation. Electronically Signed   By: Anner Crete M.D.   On: 03/14/2017 21:41    Procedures Procedures  (including critical care time)  CRITICAL CARE Performed by: Gardiner Sleeper Total critical care time: 60 minutes Critical care time was exclusive of separately billable procedures and treating other patients. Critical care was necessary to treat or prevent imminent or life-threatening deterioration. Critical care was time spent personally by me on the following activities: development of treatment plan with patient and/or surrogate as well as nursing, discussions with consultants, evaluation of patient's response to treatment, examination of patient, obtaining history from patient or surrogate, ordering and performing treatments and interventions, ordering and review of laboratory studies, ordering and review of radiographic studies, pulse oximetry and re-evaluation of patient's condition.   Medications Ordered in ED Medications  furosemide (LASIX) 160 mg in dextrose 5 % 50 mL IVPB (not administered)     Initial Impression / Assessment and Plan / ED Course  I have  reviewed the triage vital signs and the nursing notes.  Pertinent labs & imaging results that were available during my care of the patient were reviewed by me and considered in my medical decision making (see chart for details).    Patient 71 year old female presenting with anasarca.  Patient had increase in swelling.  Patient was recently saw her renal physician Dr. Gwenyth Allegra on the 18th of this month.  Was switched from her 80 mg 3 times a day of furosemide 200 mg twice a day of torsemide.  Is noticed increased swelling from her legs all the way into her abdomen.  Has noticed increased shortness of breath.  She is noticed that she is urinating less frequently than usual.  She reports that her renal physician thinks that she may have to go on dialysis soon.  No dietary indiscretions.  12:13 AM   Patient has elevated potassium.  As well as evidence of anasarca.  Her creatinine is very much elevated from her last.  Discussed  with renal. Will admit. Give kaceylate, turosemide, and continue to evaluation.    Final Clinical Impressions(s) / ED Diagnoses   Final diagnoses:  None    New Prescriptions New Prescriptions   No medications on file     Macarthur Critchley, MD 03/15/17 [phone]

## 2017-03-15 ENCOUNTER — Other Ambulatory Visit: Payer: Self-pay

## 2017-03-15 DIAGNOSIS — R0602 Shortness of breath: Secondary | ICD-10-CM | POA: Diagnosis not present

## 2017-03-15 DIAGNOSIS — R601 Generalized edema: Secondary | ICD-10-CM | POA: Diagnosis not present

## 2017-03-15 DIAGNOSIS — Z992 Dependence on renal dialysis: Secondary | ICD-10-CM | POA: Diagnosis not present

## 2017-03-15 DIAGNOSIS — D539 Nutritional anemia, unspecified: Secondary | ICD-10-CM | POA: Diagnosis present

## 2017-03-15 DIAGNOSIS — N178 Other acute kidney failure: Secondary | ICD-10-CM

## 2017-03-15 DIAGNOSIS — N179 Acute kidney failure, unspecified: Secondary | ICD-10-CM | POA: Diagnosis present

## 2017-03-15 DIAGNOSIS — E875 Hyperkalemia: Secondary | ICD-10-CM | POA: Diagnosis present

## 2017-03-15 DIAGNOSIS — E785 Hyperlipidemia, unspecified: Secondary | ICD-10-CM | POA: Diagnosis present

## 2017-03-15 DIAGNOSIS — E8809 Other disorders of plasma-protein metabolism, not elsewhere classified: Secondary | ICD-10-CM | POA: Diagnosis present

## 2017-03-15 DIAGNOSIS — N186 End stage renal disease: Secondary | ICD-10-CM | POA: Diagnosis present

## 2017-03-15 DIAGNOSIS — F17210 Nicotine dependence, cigarettes, uncomplicated: Secondary | ICD-10-CM | POA: Diagnosis present

## 2017-03-15 DIAGNOSIS — Z6833 Body mass index (BMI) 33.0-33.9, adult: Secondary | ICD-10-CM | POA: Diagnosis not present

## 2017-03-15 DIAGNOSIS — Z9851 Tubal ligation status: Secondary | ICD-10-CM | POA: Diagnosis not present

## 2017-03-15 DIAGNOSIS — R748 Abnormal levels of other serum enzymes: Secondary | ICD-10-CM | POA: Diagnosis present

## 2017-03-15 DIAGNOSIS — Z452 Encounter for adjustment and management of vascular access device: Secondary | ICD-10-CM | POA: Diagnosis not present

## 2017-03-15 DIAGNOSIS — N189 Chronic kidney disease, unspecified: Secondary | ICD-10-CM | POA: Diagnosis not present

## 2017-03-15 DIAGNOSIS — Z8249 Family history of ischemic heart disease and other diseases of the circulatory system: Secondary | ICD-10-CM | POA: Diagnosis not present

## 2017-03-15 DIAGNOSIS — N051 Unspecified nephritic syndrome with focal and segmental glomerular lesions: Secondary | ICD-10-CM | POA: Diagnosis not present

## 2017-03-15 DIAGNOSIS — N269 Renal sclerosis, unspecified: Secondary | ICD-10-CM | POA: Diagnosis present

## 2017-03-15 DIAGNOSIS — Z79899 Other long term (current) drug therapy: Secondary | ICD-10-CM | POA: Diagnosis not present

## 2017-03-15 DIAGNOSIS — Z9889 Other specified postprocedural states: Secondary | ICD-10-CM | POA: Diagnosis not present

## 2017-03-15 DIAGNOSIS — D631 Anemia in chronic kidney disease: Secondary | ICD-10-CM | POA: Diagnosis present

## 2017-03-15 DIAGNOSIS — I10 Essential (primary) hypertension: Secondary | ICD-10-CM | POA: Diagnosis not present

## 2017-03-15 DIAGNOSIS — Z833 Family history of diabetes mellitus: Secondary | ICD-10-CM | POA: Diagnosis not present

## 2017-03-15 DIAGNOSIS — I120 Hypertensive chronic kidney disease with stage 5 chronic kidney disease or end stage renal disease: Secondary | ICD-10-CM | POA: Diagnosis present

## 2017-03-15 DIAGNOSIS — N049 Nephrotic syndrome with unspecified morphologic changes: Secondary | ICD-10-CM | POA: Diagnosis present

## 2017-03-15 DIAGNOSIS — Z720 Tobacco use: Secondary | ICD-10-CM | POA: Diagnosis not present

## 2017-03-15 DIAGNOSIS — R0600 Dyspnea, unspecified: Secondary | ICD-10-CM | POA: Diagnosis not present

## 2017-03-15 DIAGNOSIS — N185 Chronic kidney disease, stage 5: Secondary | ICD-10-CM | POA: Diagnosis not present

## 2017-03-15 DIAGNOSIS — Z09 Encounter for follow-up examination after completed treatment for conditions other than malignant neoplasm: Secondary | ICD-10-CM | POA: Diagnosis not present

## 2017-03-15 DIAGNOSIS — E669 Obesity, unspecified: Secondary | ICD-10-CM | POA: Diagnosis present

## 2017-03-15 DIAGNOSIS — E8889 Other specified metabolic disorders: Secondary | ICD-10-CM | POA: Diagnosis present

## 2017-03-15 DIAGNOSIS — E8770 Fluid overload, unspecified: Secondary | ICD-10-CM | POA: Diagnosis present

## 2017-03-15 LAB — BASIC METABOLIC PANEL
Anion gap: 8 (ref 5–15)
BUN: 81 mg/dL — ABNORMAL HIGH (ref 6–20)
CHLORIDE: 119 mmol/L — AB (ref 101–111)
CO2: 14 mmol/L — AB (ref 22–32)
Calcium: 8.3 mg/dL — ABNORMAL LOW (ref 8.9–10.3)
Creatinine, Ser: 4.08 mg/dL — ABNORMAL HIGH (ref 0.44–1.00)
GFR calc non Af Amer: 10 mL/min — ABNORMAL LOW (ref >60)
GFR, EST AFRICAN AMERICAN: 12 mL/min — AB (ref >60)
Glucose, Bld: 102 mg/dL — ABNORMAL HIGH (ref 65–99)
POTASSIUM: 7 mmol/L — AB (ref 3.5–5.1)
SODIUM: 141 mmol/L (ref 135–145)

## 2017-03-15 LAB — CBC
HEMATOCRIT: 29.6 % — AB (ref 36.0–46.0)
HEMOGLOBIN: 9.9 g/dL — AB (ref 12.0–15.0)
MCH: 33.1 pg (ref 26.0–34.0)
MCHC: 33.4 g/dL (ref 30.0–36.0)
MCV: 99 fL (ref 78.0–100.0)
Platelets: 292 10*3/uL (ref 150–400)
RBC: 2.99 MIL/uL — ABNORMAL LOW (ref 3.87–5.11)
RDW: 13.9 % (ref 11.5–15.5)
WBC: 8.8 10*3/uL (ref 4.0–10.5)

## 2017-03-15 LAB — IRON AND TIBC
IRON: 92 ug/dL (ref 28–170)
Saturation Ratios: 53 % — ABNORMAL HIGH (ref 10.4–31.8)
TIBC: 175 ug/dL — ABNORMAL LOW (ref 250–450)
UIBC: 83 ug/dL

## 2017-03-15 LAB — GLUCOSE, CAPILLARY: GLUCOSE-CAPILLARY: 88 mg/dL (ref 65–99)

## 2017-03-15 LAB — TROPONIN I: Troponin I: <0.03 ng/mL (ref <0.03)

## 2017-03-15 LAB — TSH: TSH: 2.893 u[IU]/mL (ref 0.350–4.500)

## 2017-03-15 MED ORDER — METOLAZONE 10 MG PO TABS
10.0000 mg | ORAL_TABLET | Freq: Two times a day (BID) | ORAL | Status: DC
  Administered 2017-03-15 – 2017-03-21 (×12): 10 mg via ORAL
  Filled 2017-03-15 (×2): qty 1
  Filled 2017-03-15: qty 2
  Filled 2017-03-15 (×4): qty 1
  Filled 2017-03-15: qty 2
  Filled 2017-03-15 (×7): qty 1

## 2017-03-15 MED ORDER — ACETAMINOPHEN 650 MG RE SUPP: 650.0000 mg | Freq: Four times a day (QID) | RECTAL | Status: DC | PRN

## 2017-03-15 MED ORDER — PREDNISONE 5 MG PO TABS
5.0000 mg | ORAL_TABLET | Freq: Every day | ORAL | Status: DC
  Administered 2017-03-15 – 2017-03-21 (×7): 5 mg via ORAL
  Filled 2017-03-15 (×7): qty 1

## 2017-03-15 MED ORDER — FUROSEMIDE 10 MG/ML IJ SOLN
INTRAMUSCULAR | Status: AC
  Filled 2017-03-15: qty 16

## 2017-03-15 MED ORDER — CALCIUM GLUCONATE 10 % IV SOLN: 1.0000 g | Freq: Once | INTRAVENOUS | Status: DC

## 2017-03-15 MED ORDER — DEXTROSE 50 % IV SOLN
1.0000 | Freq: Once | INTRAVENOUS | Status: AC
  Administered 2017-03-15: 50 mL via INTRAVENOUS
  Filled 2017-03-15: qty 50

## 2017-03-15 MED ORDER — SODIUM POLYSTYRENE SULFONATE 15 GM/60ML PO SUSP
30.0000 g | Freq: Once | ORAL | Status: AC
  Administered 2017-03-15: 30 g via ORAL
  Filled 2017-03-15: qty 120

## 2017-03-15 MED ORDER — ACETAMINOPHEN 325 MG PO TABS
650.0000 mg | ORAL_TABLET | Freq: Four times a day (QID) | ORAL | Status: DC | PRN
  Administered 2017-03-17 – 2017-03-18 (×2): 650 mg via ORAL
  Filled 2017-03-15 (×2): qty 2

## 2017-03-15 MED ORDER — ROSUVASTATIN CALCIUM 20 MG PO TABS
20.0000 mg | ORAL_TABLET | Freq: Every day | ORAL | Status: DC
Start: 2017-03-15 — End: 2017-03-15

## 2017-03-15 MED ORDER — ALBUTEROL SULFATE (2.5 MG/3ML) 0.083% IN NEBU
10.0000 mg | INHALATION_SOLUTION | Freq: Once | RESPIRATORY_TRACT | Status: DC
  Filled 2017-03-15: qty 12

## 2017-03-15 MED ORDER — METOPROLOL TARTRATE 50 MG PO TABS
50.0000 mg | ORAL_TABLET | Freq: Two times a day (BID) | ORAL | Status: DC
  Administered 2017-03-15 – 2017-03-20 (×12): 50 mg via ORAL
  Filled 2017-03-15 (×12): qty 1

## 2017-03-15 MED ORDER — FUROSEMIDE 10 MG/ML IJ SOLN
160.0000 mg | Freq: Once | INTRAVENOUS | Status: AC
  Administered 2017-03-15: 160 mg via INTRAVENOUS
  Filled 2017-03-15: qty 16

## 2017-03-15 MED ORDER — SODIUM BICARBONATE 650 MG PO TABS
1950.0000 mg | ORAL_TABLET | Freq: Three times a day (TID) | ORAL | Status: DC
  Administered 2017-03-15 – 2017-03-21 (×14): 1950 mg via ORAL
  Filled 2017-03-15 (×14): qty 3

## 2017-03-15 MED ORDER — IPRATROPIUM-ALBUTEROL 0.5-2.5 (3) MG/3ML IN SOLN
3.0000 mL | RESPIRATORY_TRACT | Status: DC | PRN
  Administered 2017-03-15: 3 mL via RESPIRATORY_TRACT
  Filled 2017-03-15: qty 3

## 2017-03-15 MED ORDER — INSULIN ASPART 100 UNIT/ML IV SOLN
10.0000 [IU] | Freq: Once | INTRAVENOUS | Status: AC
  Administered 2017-03-15: 10 [IU] via INTRAVENOUS

## 2017-03-15 MED ORDER — FUROSEMIDE 10 MG/ML IJ SOLN
160.0000 mg | Freq: Four times a day (QID) | INTRAVENOUS | Status: DC
  Administered 2017-03-15 – 2017-03-20 (×19): 160 mg via INTRAVENOUS
  Filled 2017-03-15 (×3): qty 16
  Filled 2017-03-15: qty 10
  Filled 2017-03-15: qty 16
  Filled 2017-03-15: qty 10
  Filled 2017-03-15 (×2): qty 16
  Filled 2017-03-15: qty 10
  Filled 2017-03-15: qty 16
  Filled 2017-03-15 (×2): qty 10
  Filled 2017-03-15: qty 16
  Filled 2017-03-15: qty 10
  Filled 2017-03-15 (×3): qty 16
  Filled 2017-03-15: qty 10
  Filled 2017-03-15 (×3): qty 16
  Filled 2017-03-15: qty 10
  Filled 2017-03-15: qty 16
  Filled 2017-03-15 (×2): qty 10
  Filled 2017-03-15: qty 4
  Filled 2017-03-15: qty 16

## 2017-03-15 MED ORDER — TACROLIMUS 1 MG PO CAPS
3.0000 mg | ORAL_CAPSULE | Freq: Two times a day (BID) | ORAL | Status: DC
  Administered 2017-03-15 – 2017-03-21 (×10): 3 mg via ORAL
  Filled 2017-03-15 (×11): qty 3

## 2017-03-15 MED ORDER — ONDANSETRON HCL 4 MG/2ML IJ SOLN: 4.0000 mg | Freq: Four times a day (QID) | INTRAMUSCULAR | Status: DC | PRN

## 2017-03-15 MED ORDER — ATORVASTATIN CALCIUM 20 MG PO TABS
20.0000 mg | ORAL_TABLET | Freq: Every day | ORAL | Status: DC
  Administered 2017-03-15 – 2017-03-21 (×6): 20 mg via ORAL
  Filled 2017-03-15 (×6): qty 1

## 2017-03-15 MED ORDER — ONDANSETRON HCL 4 MG PO TABS: 4.0000 mg | ORAL_TABLET | Freq: Four times a day (QID) | ORAL | Status: DC | PRN

## 2017-03-15 MED ORDER — SODIUM CHLORIDE 0.9 % IV SOLN
1.0000 g | INTRAVENOUS | Status: AC
  Administered 2017-03-15: 1 g via INTRAVENOUS
  Filled 2017-03-15: qty 10

## 2017-03-15 MED ORDER — SODIUM POLYSTYRENE SULFONATE 15 GM/60ML PO SUSP
60.0000 g | Freq: Once | ORAL | Status: AC
  Administered 2017-03-15: 60 g via ORAL
  Filled 2017-03-15: qty 240

## 2017-03-15 MED ORDER — HEPARIN SODIUM (PORCINE) 5000 UNIT/ML IJ SOLN
5000.0000 [IU] | Freq: Three times a day (TID) | INTRAMUSCULAR | Status: DC
  Administered 2017-03-15 – 2017-03-17 (×7): 5000 [IU] via SUBCUTANEOUS
  Filled 2017-03-15 (×7): qty 1

## 2017-03-15 MED ORDER — TACROLIMUS 1 MG PO CAPS
5.0000 mg | ORAL_CAPSULE | Freq: Two times a day (BID) | ORAL | Status: DC
  Administered 2017-03-15: 5 mg via ORAL
  Filled 2017-03-15: qty 5

## 2017-03-15 NOTE — Progress Notes (Signed)
Pt admitted after midnight. Please refer to admission note done 03/15/2017. Pt admitted with anasarca. Has history of FSGN, has had 3 HD treatments back in 07/2016. Now comes in with worsening Cr, anasarca and hyperkalemia. She has been given calcium gluconate, kayexalate, insulin for potassium of 7 and next potassium level is pending. Will transfer to [facility] and spoke with nephrology who agrees with transfer.  Leisa Lenz [facility] [phone]

## 2017-03-15 NOTE — Consult Note (Signed)
Reason for Consult:CKD4, AKI, volume overload Referring Physician: Dr. Felicity Pellegrini is an 71 y.o. female.  HPI: 72 yr female with hx of FSGS , collapsing variant dx in 3/18 when she presented with anasarca, AKI from ATN of NS, required transient HD then recovered.  Followed by Dr. Lorrene Reid and maintained on Prograf, Cellcept, and Pred.  Has been worsening.  Prob getting meds recurrently and with diet/fluid intake.  Taking high dose diuretics at home.Clarnce Flock Dr. Lorrene Reid on 10/18 with worsening edema and adjustments were made to meds. To get HD access on 11/1 for eval.  Still now with progressive LE edema, abdm swelling , PND, DOE, orthop. Worse since her office visit but started the past 4-6 wk.  Cr baseline on 10/18 was 2.8, and now 4.3.  K ^ and has been taking her K replacement tid.  No hematuria, flank pain, but N, D.  No NSAIDs or ACEI Constitutional: as above, doesn"t feel good Eyes: negative Ears, nose, mouth, throat, and face: drymouth Respiratory: SOB, no cough or sputum, has been wheezing Cardiovascular: as above Gastrointestinal: V about 1x/d, D about 4 x/d Genitourinary:less Noct,  Integument/breast: negative Musculoskeletal:negative Neurological: negative Endocrine: negative Allergic/Immunologic: negative    Primary Nephrologist Lorrene Reid.   Past Medical History:  Diagnosis Date  . Focal segmental glomerulosclerosis   . Hypertension   . Tobacco abuse     Past Surgical History:  Procedure Laterality Date  . IR GENERIC HISTORICAL  08/15/2016   IR US GUIDE VASC ACCESS RIGHT 08/15/2016 Arne Cleveland, MD [facility]  . IR GENERIC HISTORICAL  08/15/2016   IR FLUORO GUIDE CV LINE RIGHT 08/15/2016 Arne Cleveland, MD [facility]  . TUBAL LIGATION      Family History  Problem Relation Age of Onset  . Hypertension Mother   . Diabetes Mother   . Hypertension Father   . Diabetes Father   . Hypertension Sister   . Hypertension Brother     Social History:  reports that  she has been smoking Cigarettes.  She has been smoking about 0.50 packs per day. She has never used smokeless tobacco. She reports that she does not drink alcohol or use drugs.  Allergies: No Known Allergies  Medications:  I have reviewed the patient's current medications. Prior to Admission:  Prescriptions Prior to Admission  Medication Sig Dispense Refill Last Dose  . atorvastatin (LIPITOR) 20 MG tablet Take 1 tablet (20 mg total) by mouth daily at 6 PM.   03/13/2017  . metolazone (ZAROXOLYN) 10 MG tablet Take 10 mg by mouth 2 (two) times daily.  5 03/14/2017 at Unknown time  . metoprolol tartrate (LOPRESSOR) 50 MG tablet Take 50 mg by mouth 2 (two) times daily.  5 03/14/2017 at 1100  . multivitamin (RENA-VIT) TABS tablet Take 1 tablet by mouth at bedtime. 30 tablet 3 03/13/2017  . potassium chloride SA (K-DUR,KLOR-CON) 20 MEQ tablet Take 2 tablets (40 mEq total) by mouth 2 (two) times daily. 120 tablet 0 03/13/2017 at Unknown time  . predniSONE (DELTASONE) 5 MG tablet Take 5 mg by mouth daily.  5 03/14/2017 at Unknown time  . rosuvastatin (CRESTOR) 20 MG tablet Take 20 mg by mouth at bedtime.    03/13/2017 at Unknown time  . tacrolimus (PROGRAF) 5 MG capsule Take 5 mg by mouth 2 (two) times daily.  5 03/14/2017 at Unknown time  . torsemide (DEMADEX) 100 MG tablet Take 100 mg by mouth 2 (two) times daily.  5 03/14/2017 at  Unknown time  . Vitamin D, Ergocalciferol, (DRISDOL) 50000 units CAPS capsule Take 50,000 Units by mouth once a week.  5 03/12/2017  . furosemide (LASIX) 80 MG tablet Take 2 tablets (160 mg total) by mouth 3 (three) times daily. (Patient not taking: Reported on 03/15/2017) 60 tablet 0 Not Taking at Unknown time  . metolazone (ZAROXOLYN) 5 MG tablet Take 1 tablet (5 mg total) by mouth daily. (Patient not taking: Reported on 03/15/2017) 30 tablet 30 Not Taking at Unknown time  . metoprolol tartrate (LOPRESSOR) 25 MG tablet Take 1 tablet (25 mg total) by mouth 2 (two) times  daily. (Patient not taking: Reported on 03/15/2017) 60 tablet 60 Not Taking at Unknown time  . nicotine (NICODERM CQ - DOSED IN MG/24 HOURS) 21 mg/24hr patch Place 1 patch (21 mg total) onto the skin daily. (Patient not taking: Reported on 03/15/2017) 28 patch 0 Not Taking at Unknown time  . predniSONE (DELTASONE) 20 MG tablet Take 3 tablets (60 mg total) by mouth daily with breakfast. (Patient not taking: Reported on 03/15/2017) 90 tablet 0 Completed Course at Unknown time  . tacrolimus (PROGRAF) 1 MG capsule Take 3 capsules (3 mg total) by mouth 2 (two) times daily. (Patient not taking: Reported on 03/15/2017) 60 capsule 0 Not Taking at Unknown time    Results for orders placed or performed during the hospital encounter of 03/14/17 (from the past 48 hour(s))  CBC with Differential/Platelet     Status: Abnormal   Collection Time: 03/14/17 10:00 PM  Result Value Ref Range   WBC 10.3 4.0 - 10.5 K/uL   RBC 3.06 (L) 3.87 - 5.11 MIL/uL   Hemoglobin 10.0 (L) 12.0 - 15.0 g/dL   HCT 31.0 (L) 36.0 - 46.0 %   MCV 101.3 (H) 78.0 - 100.0 fL   MCH 32.7 26.0 - 34.0 pg   MCHC 32.3 30.0 - 36.0 g/dL   RDW 13.5 11.5 - 15.5 %   Platelets 279 150 - 400 K/uL   Neutrophils Relative % 78 %   Lymphocytes Relative 11 %   Monocytes Relative 9 %   Eosinophils Relative 2 %   Basophils Relative 0 %   Neutro Abs 8.1 (H) 1.7 - 7.7 K/uL   Lymphs Abs 1.1 0.7 - 4.0 K/uL   Monocytes Absolute 0.9 0.1 - 1.0 K/uL   Eosinophils Absolute 0.2 0.0 - 0.7 K/uL   Basophils Absolute 0.0 0.0 - 0.1 K/uL   Smear Review MORPHOLOGY UNREMARKABLE   Comprehensive metabolic panel     Status: Abnormal   Collection Time: 03/14/17 10:00 PM  Result Value Ref Range   Sodium 135 135 - 145 mmol/L   Potassium 6.7 (HH) 3.5 - 5.1 mmol/L    Comment: NO VISIBLE HEMOLYSIS CRITICAL RESULT CALLED TO, READ BACK BY AND VERIFIED WITH: MAYNARD,C AT 2336 ON [id number] BY CHERESNOWSKY,T    Chloride 117 (H) 101 - 111 mmol/L   CO2 14 (L) 22 - 32 mmol/L    Glucose, Bld 99 65 - 99 mg/dL   BUN 79 (H) 6 - 20 mg/dL   Creatinine, Ser 4.31 (H) 0.44 - 1.00 mg/dL   Calcium 8.0 (L) 8.9 - 10.3 mg/dL   Total Protein 4.7 (L) 6.5 - 8.1 g/dL   Albumin 1.7 (L) 3.5 - 5.0 g/dL   AST 19 15 - 41 U/L   ALT 11 (L) 14 - 54 U/L   Alkaline Phosphatase 58 38 - 126 U/L   Total Bilirubin 0.2 (L) 0.3 - 1.2 mg/dL  GFR calc non Af Amer 10 (L) >60 mL/min   GFR calc Af Amer 11 (L) >60 mL/min    Comment: (NOTE) The eGFR has been calculated using the CKD EPI equation. This calculation has not been validated in all clinical situations. eGFR's persistently <60 mL/min signify possible Chronic Kidney Disease.    Anion gap 4 (L) 5 - 15  Brain natriuretic peptide     Status: Abnormal   Collection Time: 03/14/17 10:00 PM  Result Value Ref Range   B Natriuretic Peptide 141.1 (H) 0.0 - 100.0 pg/mL  Troponin I     Status: Abnormal   Collection Time: 03/14/17 10:00 PM  Result Value Ref Range   Troponin I 0.03 (HH) <0.03 ng/mL    Comment: CRITICAL RESULT CALLED TO, READ BACK BY AND VERIFIED WITH: MAYNARD,C AT 2348 ON [id number] BY CHERESNOWSKY,T   Urinalysis, Routine w reflex microscopic     Status: Abnormal   Collection Time: 03/14/17 11:30 PM  Result Value Ref Range   Color, Urine YELLOW YELLOW   APPearance CLOUDY (A) CLEAR   Specific Gravity, Urine 1.020 1.005 - 1.030   pH 6.0 5.0 - 8.0   Glucose, UA 100 (A) NEGATIVE mg/dL   Hgb urine dipstick MODERATE (A) NEGATIVE   Bilirubin Urine NEGATIVE NEGATIVE   Ketones, ur NEGATIVE NEGATIVE mg/dL   Protein, ur >300 (A) NEGATIVE mg/dL   Nitrite NEGATIVE NEGATIVE   Leukocytes, UA NEGATIVE NEGATIVE  Urinalysis, Microscopic (reflex)     Status: Abnormal   Collection Time: 03/14/17 11:30 PM  Result Value Ref Range   RBC / HPF 0-5 0 - 5 RBC/hpf   WBC, UA 0-5 0 - 5 WBC/hpf   Bacteria, UA FEW (A) NONE SEEN   Squamous Epithelial / LPF 6-30 (A) NONE SEEN   Hyaline Casts, UA PRESENT    Granular Casts, UA PRESENT   Troponin I      Status: None   Collection Time: 03/15/17  5:13 AM  Result Value Ref Range   Troponin I <0.03 <0.03 ng/mL  CBC     Status: Abnormal   Collection Time: 03/15/17  5:13 AM  Result Value Ref Range   WBC 8.8 4.0 - 10.5 K/uL   RBC 2.99 (L) 3.87 - 5.11 MIL/uL   Hemoglobin 9.9 (L) 12.0 - 15.0 g/dL   HCT 29.6 (L) 36.0 - 46.0 %   MCV 99.0 78.0 - 100.0 fL   MCH 33.1 26.0 - 34.0 pg   MCHC 33.4 30.0 - 36.0 g/dL   RDW 13.9 11.5 - 15.5 %   Platelets 292 150 - 400 K/uL  TSH     Status: None   Collection Time: 03/15/17  5:13 AM  Result Value Ref Range   TSH 2.893 0.350 - 4.500 uIU/mL    Comment: Performed by a 3rd Generation assay with a functional sensitivity of <=0.01 uIU/mL.  Basic metabolic panel     Status: Abnormal   Collection Time: 03/15/17  5:13 AM  Result Value Ref Range   Sodium 141 135 - 145 mmol/L   Potassium 7.0 (HH) 3.5 - 5.1 mmol/L    Comment: CRITICAL RESULT CALLED TO, READ BACK BY AND VERIFIED WITH: S KLESH,RN _0  03/15/17 MKELLY    Chloride 119 (H) 101 - 111 mmol/L   CO2 14 (L) 22 - 32 mmol/L   Glucose, Bld 102 (H) 65 - 99 mg/dL   BUN 81 (H) 6 - 20 mg/dL   Creatinine, Ser 4.08 (H) 0.44 - 1.00 mg/dL  Calcium 8.3 (L) 8.9 - 10.3 mg/dL   GFR calc non Af Amer 10 (L) >60 mL/min   GFR calc Af Amer 12 (L) >60 mL/min    Comment: (NOTE) The eGFR has been calculated using the CKD EPI equation. This calculation has not been validated in all clinical situations. eGFR's persistently <60 mL/min signify possible Chronic Kidney Disease.    Anion gap 8 5 - 15  Glucose, capillary     Status: None   Collection Time: 03/15/17  8:32 AM  Result Value Ref Range   Glucose-Capillary 88 65 - 99 mg/dL  Troponin I     Status: None   Collection Time: 03/15/17 11:50 AM  Result Value Ref Range   Troponin I <0.03 <0.03 ng/mL    Dg Chest 2 View  Result Date: 03/14/2017 CLINICAL DATA:  71 year old female with shortness of breath. EXAM: CHEST  2 VIEW COMPARISON:  Chest radiograph dated  09/23/2016 FINDINGS: Minimal bibasilar densities may represent atelectatic changes. Clinical correlation is recommended. There is no focal consolidation. No large pleural effusion or pneumothorax. The cardiac silhouette is within normal limits. No acute osseous pathology. IMPRESSION: Minimal bibasilar atelectasis.  No focal consolidation. Electronically Signed   By: Anner Crete M.D.   On: 03/14/2017 21:41    ROS Blood pressure (!) 156/94, pulse 79, temperature 98.8 F (37.1 C), temperature source Oral, resp. rate 20, height _0  (1.676 m), weight 93 kg (205 lb 0.4 oz), SpO2 95 %. Physical Exam Physical Examination: General appearance - alert, well appearing, and in no distress, overweight and ill-appearing Mental status - alert, oriented to person, place, and time Eyes - funduscopic exam normal, discs flat and sharp Mouth - mucous membranes moist, pharynx normal without lesions Neck - adenopathy noted PCL Lymphatics - posterior cervical nodes Chest - diffuse wheezes, rales to mid lungs Heart - S1 and S2 normal, S4 present, systolic murmur OI7/1 at 2nd left intercostal space Abdomen - obese, pos bs,liver down 4 cm Musculoskeletal - no joint tenderness, deformity or swelling Extremities - pedal edema 3-4+ + Skin - moles, acrocorsons, scars, shiny on legs  Assessment/Plan: 1 CKD 4 from FSGS 2 AKI from Vol xs, ? Hypoperfusion, ? ATN form NS , ? Prograf.   Needs diuresis, control of K, acid base  Will try diuretics high dose and if fails, HD.  3 Hypertension: vol xs 4. Anemia of ESRD: eval 5. Metabolic Bone Disease: eval 6 Vol xs 7 Obesity  P High dose Lasix, move to [facility], PTH, Fe , lower Prograf , check level,   Ladanian Kelter L 03/15/2017, 1:20 PM

## 2017-03-15 NOTE — Progress Notes (Signed)
CRITICAL VALUE ALERT  Critical Value:  K+ = 7.0  Date & Time Notied: 03/15/17 0740  Provider Notified: Charlies Silvers, A  Orders Received/Actions taken:  Ca+ gluconate 1 G, kayexalate 30

## 2017-03-15 NOTE — ED Notes (Signed)
Pt placed on cardiac monitor 

## 2017-03-15 NOTE — Progress Notes (Signed)
Patient ID: Mary Chapman, female   DOB: [birth year], 71 y.o.   MRN: [id number]   71 yo female with h/o FSGS, HTN, CKD being transferred from [facility] to [facility] with AKI on CKD and anasarca which is worsening despite being changed from Lasix 80 TID to torsemide BID.  Additionally while at [facility] she was treated with kayexalate and calcium for hyperkalemia. EDP spoke with renal who will consult.

## 2017-03-15 NOTE — H&P (Signed)
History and Physical    Mary Chapman ASN:[id number] DOB: 05-10-1946 DOA: 03/14/2017  Referring MD/NP/PA: Dr. Thomasene Lot PCP: [facility] [place]  Patient coming from: Transfer from [facility].  Chief Complaint: Shortness of breath on exertion  HPI: Mary Chapman is a 71 y.o. female with medical history significant of HTN, tobacco abuse, and focal segmental glomerularsclerosis; who presents with complaints of shortness of breath on exertion and worsening lower extremity swelling. Back in 07/2016 the patient was admitted to the hospital with anasarca found to be related to collapsing focal segmental glomerular sclerosis after kidney biopsy and ATN. She was treated with high-dose Lasix and required two sessions of hemodialysis before return of her kidney function. Since that time she had been being followed by Dr.  Waynette Buttery of nephrology. Patient notes that her Torsemide dose was decreased from 80 mg TID down to 100 mg twice daily on October 18 due to with the patient reported as decreased kidney function. Since her change in dosage patient notes being significantly short of breath with any kind of activity. Associated symptoms include Colace, wheezing, nausea, and loose watery stools.  Her and her nephrologist had recently discussed her likely need of being placed on hemodialysis for which she was scheduled to have an appointment with vascular surgery on November 1 for vascular access.   ED Course: Upon admission to the emergency department patient was noted to be afebrile, pulse 7985, respirations 16-30, and all other vital signs maintained. Labs revealed WBC 10.3, hemoglobin 10, sodium 135, potassium 6.7, chloride 117, CO2 14, BUN 75, 24.31, calcium 8, and troponin 0.03. Patient's case was discussed with renal who recommended TRH to admit and they will consult. Patient was given 30 g of Kayexalate, 1 g of calcium gluconate, and 160 mg of Lasix IV.  Review of Systems  Constitutional: Positive for  malaise/fatigue.  HENT: Negative for ear pain and tinnitus.   Eyes: Negative for photophobia and pain.  Respiratory: Positive for shortness of breath.   Cardiovascular: Positive for leg swelling. Negative for chest pain.  Gastrointestinal: Negative for abdominal pain, diarrhea and vomiting.  Genitourinary: Negative for dysuria and frequency.  Musculoskeletal: Positive for back pain. Negative for falls.  Skin: Negative for itching and rash.  Neurological: Positive for weakness. Negative for sensory change and speech change.  Endo/Heme/Allergies: Negative for polydipsia.  Psychiatric/Behavioral: Negative for substance abuse and suicidal ideas.    Past Medical History:  Diagnosis Date  . Focal segmental glomerulosclerosis   . Hypertension   . Tobacco abuse     Past Surgical History:  Procedure Laterality Date  . IR GENERIC HISTORICAL  08/15/2016   IR US GUIDE VASC ACCESS RIGHT 08/15/2016 Arne Cleveland, MD [facility]  . IR GENERIC HISTORICAL  08/15/2016   IR FLUORO GUIDE CV LINE RIGHT 08/15/2016 Arne Cleveland, MD [facility]  . TUBAL LIGATION       reports that she has been smoking Cigarettes.  She has been smoking about 0.50 packs per day. She has never used smokeless tobacco. She reports that she does not drink alcohol or use drugs.  No Known Allergies  Family History  Problem Relation Age of Onset  . Hypertension Mother   . Diabetes Mother   . Hypertension Father   . Diabetes Father   . Hypertension Sister   . Hypertension Brother     Prior to Admission medications   Medication Sig Start Date End Date Taking? Authorizing Provider  atorvastatin (LIPITOR) 20 MG tablet Take 1 tablet (20 mg total)  by mouth daily at 6 PM. 08/20/16   Rama, Venetia Maxon, MD  furosemide (LASIX) 80 MG tablet Take 2 tablets (160 mg total) by mouth 3 (three) times daily. 08/20/16   Rama, Venetia Maxon, MD  metolazone (ZAROXOLYN) 5 MG tablet Take 1 tablet (5 mg total) by mouth daily. 08/21/16   Rama,  Venetia Maxon, MD  metoprolol tartrate (LOPRESSOR) 25 MG tablet Take 1 tablet (25 mg total) by mouth 2 (two) times daily. 08/20/16   Rama, Venetia Maxon, MD  multivitamin (RENA-VIT) TABS tablet Take 1 tablet by mouth at bedtime. 08/20/16   Rama, Venetia Maxon, MD  nicotine (NICODERM CQ - DOSED IN MG/24 HOURS) 21 mg/24hr patch Place 1 patch (21 mg total) onto the skin daily. 08/20/16   Rama, Venetia Maxon, MD  potassium chloride SA (K-DUR,KLOR-CON) 20 MEQ tablet Take 2 tablets (40 mEq total) by mouth 2 (two) times daily. 08/20/16   Rama, Venetia Maxon, MD  predniSONE (DELTASONE) 20 MG tablet Take 3 tablets (60 mg total) by mouth daily with breakfast. 08/21/16   Rama, Venetia Maxon, MD  rosuvastatin (CRESTOR) 20 MG tablet Take 20 mg by mouth daily. 09/11/16   [provider]  tacrolimus (PROGRAF) 1 MG capsule Take 3 capsules (3 mg total) by mouth 2 (two) times daily. 08/20/16   Rama, Venetia Maxon, MD    Physical Exam:  Constitutional: Elderly female who appears to be in mild discomfort Vitals:   03/15/17 0030 03/15/17 0100 03/15/17 0130 03/15/17 0233  BP: (!) 165/98 (!) 159/94 (!) 146/80 (!) 142/88  Pulse: 84 84 85 80  Resp: 18 (!) 23 16   Temp:    98.7 F (37.1 C)  TempSrc:    Oral  SpO2: 99% 100% 100% 100%  Weight:    93 kg (205 lb 0.4 oz)  Height:    5\' 6"  (1.676 m)   Eyes: PERRL, lids and conjunctivae normal ENMT: Mucous membranes are moist. Posterior pharynx clear of any exudate or lesions.  Neck: normal, supple, no masses, no thyromegaly Respiratory: Normal respiratory effort with expiratory wheeze present. Cardiovascular: Regular rate and rhythm, no murmurs / rubs / gallops. 3+ pitting lower extremity edema. 2+ pedal pulses. No carotid bruits.  Abdomen: Abdominal swelling with positive fluid wave, no masses palpated. No hepatosplenomegaly. Bowel sounds positive.  Musculoskeletal: no clubbing / cyanosis. No joint deformity upper and lower extremities. Good ROM, no contractures. Normal muscle tone.    Skin: no rashes, lesions, ulcers. No induration Neurologic: CN 2-12 grossly intact. Sensation intact, DTR normal. Strength 5/5 in all 4.  Psychiatric: Normal judgment and insight. Alert and oriented x 3. Normal mood.     Labs on Admission: I have personally reviewed following labs and imaging studies  CBC:  Recent Labs Lab 03/14/17 2200  WBC 10.3  NEUTROABS 8.1*  HGB 10.0*  HCT 31.0*  MCV 101.3*  PLT 742   Basic Metabolic Panel:  Recent Labs Lab 03/14/17 2200  NA 135  K 6.7*  CL 117*  CO2 14*  GLUCOSE 99  BUN 79*  CREATININE 4.31*  CALCIUM 8.0*   GFR: Estimated Creatinine Clearance: 14 mL/min (A) (by C-G formula based on SCr of 4.31 mg/dL (H)). Liver Function Tests:  Recent Labs Lab 03/14/17 2200  AST 19  ALT 11*  ALKPHOS 58  BILITOT 0.2*  PROT 4.7*  ALBUMIN 1.7*   No results for input(s): LIPASE, AMYLASE in the last 168 hours. No results for input(s): AMMONIA in the last 168 hours. Coagulation Profile:  No results for input(s): INR, PROTIME in the last 168 hours. Cardiac Enzymes:  Recent Labs Lab 03/14/17 2200  TROPONINI 0.03*   BNP (last 3 results) No results for input(s): PROBNP in the last 8760 hours. HbA1C: No results for input(s): HGBA1C in the last 72 hours. CBG: No results for input(s): GLUCAP in the last 168 hours. Lipid Profile: No results for input(s): CHOL, HDL, LDLCALC, TRIG, CHOLHDL, LDLDIRECT in the last 72 hours. Thyroid Function Tests: No results for input(s): TSH, T4TOTAL, FREET4, T3FREE, THYROIDAB in the last 72 hours. Anemia Panel: No results for input(s): VITAMINB12, FOLATE, FERRITIN, TIBC, IRON, RETICCTPCT in the last 72 hours. Urine analysis:    Component Value Date/Time   COLORURINE YELLOW 03/14/2017 2330   APPEARANCEUR CLOUDY (A) 03/14/2017 2330   LABSPEC 1.020 03/14/2017 2330   PHURINE 6.0 03/14/2017 2330   GLUCOSEU 100 (A) 03/14/2017 2330   HGBUR MODERATE (A) 03/14/2017 2330   BILIRUBINUR NEGATIVE 03/14/2017  2330   KETONESUR NEGATIVE 03/14/2017 2330   PROTEINUR >300 (A) 03/14/2017 2330   NITRITE NEGATIVE 03/14/2017 2330   LEUKOCYTESUR NEGATIVE 03/14/2017 2330   Sepsis Labs: No results found for this or any previous visit (from the past 240 hour(s)).   Radiological Exams on Admission: Dg Chest 2 View  Result Date: 03/14/2017 CLINICAL DATA:  71 year old female with shortness of breath. EXAM: CHEST  2 VIEW COMPARISON:  Chest radiograph dated 09/23/2016 FINDINGS: Minimal bibasilar densities may represent atelectatic changes. Clinical correlation is recommended. There is no focal consolidation. No large pleural effusion or pneumothorax. The cardiac silhouette is within normal limits. No acute osseous pathology. IMPRESSION: Minimal bibasilar atelectasis.  No focal consolidation. Electronically Signed   By: Anner Crete M.D.   On: 03/14/2017 21:41    EKG: Independently reviewed. Normal sinus rhythm with peaked T waves  Assessment/Plan Anasarca 2/2 Focal segmental glomerulosclerosis, Acute renal failure superimposed on chronic kidney disease: Patient presents with complaints of lower extremity edema and shortness of breath with exertion. On physical exam patient with 3+ pitting edema lab work revealed creatinine 4.31 and BUN 75. Patient followed by Dr. Ebbie Latus nephrology and noted on the need to likely go on hemodialysis in the near future. - Admit to telemetry bed - Strict ins and outs and daily weights - Continue Prograf, metolazone, and prednisone  - Appreciate nephrology consultative services, follow-up for further recommendations   Hyperkalemia: Acute. Potassium noted. Elevated up to 6.7 on admission with EKG changes noted. Patient given Kayexalate, calcium gluconate, and Lasix in the ED. - Hold potassium supplementation - Recheck BMP this a.m. and treat needed  Elevated troponin: Acute. Initial troponin noted to be 0.03. - Trend cardiac troponins  Macrocytic anemia: Hemoglobin  noted to be 10 on admission with elevated MCV and MCH.. - Check vitamin B12 and folate   Essential hypertension - Continue metoprolol  Hyperlipidemia: Patient appears to be on 2 statin medications at this time. She notes that she may have been prescribed one from her nephrologist and one from her primary doctor. - Address which medication patient is supposed to be continued on  Tobacco abuse: Patient reports quitting smoking at some point following her last hospitalization.  DVT prophylaxis: heparin Code Status: Full Family Communication: no family present  Disposition Plan: Likely discharged home in 2-4 days.  Consults called: Nephrology  Admission status: inpatient  Norval Morton MD [facility] Pager [phone]   If 7PM-7AM, please contact night-coverage [email or web address] Password [email or web address]  03/15/2017, 2:42 AM

## 2017-03-16 DIAGNOSIS — R778 Other specified abnormalities of plasma proteins: Secondary | ICD-10-CM

## 2017-03-16 DIAGNOSIS — Z720 Tobacco use: Secondary | ICD-10-CM

## 2017-03-16 DIAGNOSIS — E785 Hyperlipidemia, unspecified: Secondary | ICD-10-CM

## 2017-03-16 DIAGNOSIS — R7989 Other specified abnormal findings of blood chemistry: Secondary | ICD-10-CM

## 2017-03-16 DIAGNOSIS — R0600 Dyspnea, unspecified: Secondary | ICD-10-CM

## 2017-03-16 DIAGNOSIS — N049 Nephrotic syndrome with unspecified morphologic changes: Secondary | ICD-10-CM

## 2017-03-16 DIAGNOSIS — N179 Acute kidney failure, unspecified: Principal | ICD-10-CM

## 2017-03-16 DIAGNOSIS — R0602 Shortness of breath: Secondary | ICD-10-CM

## 2017-03-16 DIAGNOSIS — R748 Abnormal levels of other serum enzymes: Secondary | ICD-10-CM

## 2017-03-16 LAB — RENAL FUNCTION PANEL
Albumin: 1.4 g/dL — ABNORMAL LOW (ref 3.5–5.0)
Anion gap: 8 (ref 5–15)
BUN: 68 mg/dL — AB (ref 6–20)
CALCIUM: 7.7 mg/dL — AB (ref 8.9–10.3)
CO2: 15 mmol/L — AB (ref 22–32)
CREATININE: 3.97 mg/dL — AB (ref 0.44–1.00)
Chloride: 117 mmol/L — ABNORMAL HIGH (ref 101–111)
GFR calc Af Amer: 12 mL/min — ABNORMAL LOW (ref >60)
GFR calc non Af Amer: 11 mL/min — ABNORMAL LOW (ref >60)
GLUCOSE: 102 mg/dL — AB (ref 65–99)
PHOSPHORUS: 10.2 mg/dL — AB (ref 2.5–4.6)
Potassium: 4.2 mmol/L (ref 3.5–5.1)
SODIUM: 140 mmol/L (ref 135–145)

## 2017-03-16 LAB — CBC WITH DIFFERENTIAL/PLATELET
BASOS ABS: 0 10*3/uL (ref 0.0–0.1)
BASOS PCT: 0 %
Eosinophils Absolute: 0.2 10*3/uL (ref 0.0–0.7)
Eosinophils Relative: 3 %
HEMATOCRIT: 28.6 % — AB (ref 36.0–46.0)
Hemoglobin: 9.3 g/dL — ABNORMAL LOW (ref 12.0–15.0)
Lymphocytes Relative: 21 %
Lymphs Abs: 1.4 10*3/uL (ref 0.7–4.0)
MCH: 32.3 pg (ref 26.0–34.0)
MCHC: 32.5 g/dL (ref 30.0–36.0)
MCV: 99.3 fL (ref 78.0–100.0)
MONO ABS: 0.9 10*3/uL (ref 0.1–1.0)
Monocytes Relative: 13 %
NEUTROS ABS: 4.2 10*3/uL (ref 1.7–7.7)
NEUTROS PCT: 63 %
PLATELETS: 247 10*3/uL (ref 150–400)
RBC: 2.88 MIL/uL — ABNORMAL LOW (ref 3.87–5.11)
RDW: 14.3 % (ref 11.5–15.5)
WBC: 6.7 10*3/uL (ref 4.0–10.5)

## 2017-03-16 LAB — HEPATIC FUNCTION PANEL
ALBUMIN: 1.4 g/dL — AB (ref 3.5–5.0)
ALK PHOS: 54 U/L (ref 38–126)
ALT: 12 U/L — ABNORMAL LOW (ref 14–54)
AST: 15 U/L (ref 15–41)
BILIRUBIN TOTAL: 0.2 mg/dL — AB (ref 0.3–1.2)
Total Protein: 4 g/dL — ABNORMAL LOW (ref 6.5–8.1)

## 2017-03-16 LAB — HEPATITIS C ANTIBODY (REFLEX)

## 2017-03-16 LAB — MAGNESIUM: Magnesium: 1.9 mg/dL (ref 1.7–2.4)

## 2017-03-16 LAB — HCV COMMENT:

## 2017-03-16 LAB — HEPATITIS B SURFACE ANTIBODY,QUALITATIVE: Hep B S Ab: REACTIVE

## 2017-03-16 LAB — HEPATITIS B SURFACE ANTIGEN: Hepatitis B Surface Ag: NEGATIVE

## 2017-03-16 LAB — PARATHYROID HORMONE, INTACT (NO CA): PTH: 61 pg/mL (ref 15–65)

## 2017-03-16 NOTE — Progress Notes (Signed)
  [facility] Progress Note    Assessment/ Plan:   1 CKD 4 from FSGS- followed by Dr Lorrene Reid.  Was having talks to get vasc access and prep for HD per pt. Checking prograf level.  Ordering vein mapping today. 2 AKI from Vol xs, ? Hypoperfusion, ? ATN form NS , ? Prograf.  On Lasix 160 IV q 6.  Labs pending. 3 Hypertension: vol xs 4. Anemia of ESRD: eval 5. Metabolic Bone Disease: eval 6 Vol xs 7 Obesity  Subjective:    Got Kayexelate yesterday and was "running to the bathroom" all day yesterday.  No repeat labs yet.     Objective:   BP (!) 155/80 (BP Location: Right Arm)   Pulse 75   Temp 98.6 F (37 C) (Oral)   Resp 17   Ht 5\' 6"  (1.676 m)   Wt 93 kg (205 lb 0.4 oz)   SpO2 100%   BMI 33.09 kg/m   Intake/Output Summary (Last 24 hours) at 03/16/17 1018 Last data filed at 03/16/17 0305  Gross per 24 hour  Intake              546 ml  Output              100 ml  Net              446 ml   Weight change:   Physical Exam: GEN NAD, lying in bed HEENT sclerae anicteric NECK no JVD PULM normal WOB, bibasilar crackles CV RRR systolic murmur ABD + mildly distended EXT 3+ LE edema NEURO AAO x 3  Imaging: Dg Chest 2 View  Result Date: 03/14/2017 CLINICAL DATA:  71 year old female with shortness of breath. EXAM: CHEST  2 VIEW COMPARISON:  Chest radiograph dated 09/23/2016 FINDINGS: Minimal bibasilar densities may represent atelectatic changes. Clinical correlation is recommended. There is no focal consolidation. No large pleural effusion or pneumothorax. The cardiac silhouette is within normal limits. No acute osseous pathology. IMPRESSION: Minimal bibasilar atelectasis.  No focal consolidation. Electronically Signed   By: Anner Crete M.D.   On: 03/14/2017 21:41    Labs: BMET  Recent Labs Lab 03/14/17 2200 03/15/17 0513  NA 135 141  K 6.7* 7.0*  CL 117* 119*  CO2 14* 14*  GLUCOSE 99 102*  BUN 79* 81*  CREATININE 4.31* 4.08*  CALCIUM 8.0* 8.3*    CBC  Recent Labs Lab 03/14/17 2200 03/15/17 0513  WBC 10.3 8.8  NEUTROABS 8.1*  --   HGB 10.0* 9.9*  HCT 31.0* 29.6*  MCV 101.3* 99.0  PLT 279 292    Medications:    . atorvastatin  20 mg Oral q1800  . heparin  5,000 Units Subcutaneous Q8H  . metolazone  10 mg Oral BID  . metoprolol tartrate  50 mg Oral BID  . predniSONE  5 mg Oral Q breakfast  . sodium bicarbonate  1,950 mg Oral TID  . tacrolimus  3 mg Oral BID      Madelon Lips MD [facility] pgr [phone] 03/16/2017, 10:18 AM

## 2017-03-16 NOTE — Progress Notes (Signed)
PROGRESS NOTE    Mary Chapman  XIP:[id number] DOB: [birth year] DOA: 03/14/2017 PCP: [facility]   Brief Narrative:  Mary Chapman is a 71 y.o. female with medical history significant of HTN, tobacco abuse, and focal segmental glomerularsclerosis; who presents with complaints of shortness of breath on exertion and worsening lower extremity swelling. Back in 07/2016 the patient was admitted to the hospital with anasarca found to be related to collapsing focal segmental glomerular sclerosis after kidney biopsy and ATN. She was treated with high-dose Lasix and required two sessions of hemodialysis before return of her kidney function. Since that time she had been being followed by Dr. Waynette Buttery of nephrology. Patient notes that her Torsemide dose was decreased from 80 mg TID down to 100 mg twice daily on October 18 due to with the patient reported as decreased kidney function. Since her change in dosage patient notes being significantly short of breath with any kind of activity. Associated symptoms include wheezing, nausea, and loose watery stools.  Her and her Nephrologist had recently discussed her likely need of being placed on hemodialysis for which she was scheduled to have an appointment with vascular surgery on November 1 for vascular access. Patient was given 30 g of Kayexalate, 1 g of calcium gluconate, and 160 mg of Lasix IV on admission and transferred to [facility] for further evaluation. Nephrology consulted and are aggressively diuresing the patient.  Assessment & Plan:   Principal Problem:   Acute renal failure superimposed on chronic kidney disease ([place]) Active Problems:   Anasarca   Tobacco abuse   Essential hypertension   Dyspnea   Nephrotic syndrome   Acute kidney injury superimposed on chronic kidney disease ([facility])   Focal segmental glomerulosclerosis   Elevated troponin   Hyperlipidemia   SOB (shortness of breath)  Acute Kidney Injury on CKD Stage 4 in the setting of  FSGS, Collapsing Varient but unclear Etiology  -Has Hx of 3 Hemodialysis Treatments -Had Worsening Cr and Anasarca in the setting of Hypoalbuminemia -BUN/Cr went from 79/4.31 -> 81/4.08 -> 68/3.97 -Nephrology Consulted Appreciate Recc's -Nephrology ordering Vein Mapping and Checking Prograf Level -C/w Furosemide IV 160 mg q6h and with Metolazone 10 mg po BID -C/w Tacrolimuss 3 mg po BID, Prednisone 5 mg po Daily,  -Repeat CMP in AM  -Strict I's/O's, Daily Weights -Hepatitis B and C Negative -Intact PTH 61 -Urinalysis showing Cloudy Urine, Moderate Hgb, >300 Protein, Few Bacteria, Granular and Hyaline Casts -Nephrology having Dialysis talks with the patient  -Sees Dr. Buelah Manis and will need to follow up with her after Discharge  SOB 2/2 Volume Overload and Above -CXR shows Minimal bibasilar densities may represent atelectatic changes. Clinical correlation is recommended. There is no focal consolidation. No large pleural effusion or pneumothorax. The cardiac silhouette is within normal limits. No acute osseous pathology. -C/w IV Diuresis as above  -C/w DuoNeb 3 mL q4hprn for Wheezing/SOB  Anasarca -See Above -C/w Diuresis  Hyperkalemia, improved -Patient's K+ was 7.0 in the setting of Renal Failure -Given Sodium Polystyrene 60 grams po once yesterday along with IV Calcium Gluconate and Insulin 10 units IV -Hold Potassium Supplementation  -Repeat K+ was 4.2 -Continue to Monitor and Repeat CMP in AM  Elevated Troponin -Acute. Initial troponin noted to be 0.03. -Trended cardiac Troponin's and repeat were <0.03 x2 -Denies Chest Pain  Macrocytic Anemia and Anemia of Chronic Kidney Disease -Anemia Panel showed Iron of 92, UIBC of 83, TIBC of 175, Saturations of 53 -Hb/Hct went from 10.0/31.0 ->  9.9/29.6 -> 9.3/28.6 -Hemoglobin noted to be 10 on admission with elevated MCV and MCH. -Check Vtamin B12 and Folate   Essential Hypertension -Continue Metoprolol 50 mg po BID -When  Necessary Add IV Hydralazine prn  Hyperlipidemia -Check Lipid Panel in AM -Patient appears to be on 2 statin medications at this time. She notes that she may have been prescribed one from her nephrologist and one from her primary doctor. -C/w Atorvastatin 20 mg po qHS  Hx of Tobacco Abuse -Patient reports quitting smoking at some point following her last hospitalization.  DVT prophylaxis: Heparin 5,000 units sq q8h Code Status: FULL CODE Family Communication: No famil Disposition Plan:   Consultants:   Nephrology Dr. Deterding/Dr. Hollie Salk   Procedures:  Vein Mapping    Antimicrobials:  Anti-infectives    None     Subjective: Seen and examined and states SOB was slightly better. Still feels swollen and states after the Kayexalate she had to use the bathroom multiple times.   Objective: Vitals:   03/15/17 2123 03/16/17 0431 03/16/17 1018 03/16/17 1326  BP:  (!) 155/80 (!) 144/72 (!) 156/88  Pulse:  75 88 83  Resp: 16 17 18    Temp:  98.6 F (37 C) 98.4 F (36.9 C) 98.5 F (36.9 C)  TempSrc:  Oral Oral Oral  SpO2:  100% 100% 100%  Weight:      Height:        Intake/Output Summary (Last 24 hours) at 03/16/17 1741 Last data filed at 03/16/17 1400  Gross per 24 hour  Intake              266 ml  Output              100 ml  Net              166 ml   Filed Weights   03/15/17 0233  Weight: 93 kg (205 lb 0.4 oz)   Examination: Physical Exam:  Constitutional: WN/WD obese AAF in NAD and appears calm  Eyes: Lids and conjunctivae normal, sclerae anicteric  ENMT: External Ears, Nose appear normal. Grossly normal hearing. Mucous membranes are moist.  Neck: Appears normal, supple, no cervical masses, normal ROM, no appreciable thyromegaly; No appreciable JVD Respiratory: Diminished to auscultation bilaterally especially at the bases and has some crackles. No wheezing, rales, rhonchi  Normal respiratory effort and patient is not tachypenic. No accessory muscle use.    Cardiovascular: RRR, Has 2/6 Systolic Murmur.. S1 and S2 auscultated. 3+  LE edema.  Abdomen: Soft, non-tender, Distended due to body habitus and swelling. No masses palpated. No appreciable hepatosplenomegaly. Bowel sounds positive.  GU: Deferred. Musculoskeletal: No clubbing / cyanosis of digits/nails. No joint deformity upper and lower extremities. Skin: No rashes, lesions, ulcers on a limited skin evaluation. No induration; Warm and dry.  Neurologic: CN 2-12 grossly intact with no focal deficits. Strength 5/5 in all 4. Romberg sign cerebellar reflexes not assessed.  Psychiatric: Normal judgment and insight. Alert and oriented x 3. Normal mood and appropriate affect.   Data Reviewed: I have personally reviewed following labs and imaging studies  CBC:  Recent Labs Lab 03/14/17 2200 03/15/17 0513 03/16/17 1025  WBC 10.3 8.8 6.7  NEUTROABS 8.1*  --  4.2  HGB 10.0* 9.9* 9.3*  HCT 31.0* 29.6* 28.6*  MCV 101.3* 99.0 99.3  PLT 279 292 947   Basic Metabolic Panel:  Recent Labs Lab 03/14/17 2200 03/15/17 0513 03/16/17 1025  NA 135 141 140  K 6.7*  7.0* 4.2  CL 117* 119* 117*  CO2 14* 14* 15*  GLUCOSE 99 102* 102*  BUN 79* 81* 68*  CREATININE 4.31* 4.08* 3.97*  CALCIUM 8.0* 8.3* 7.7*  MG  --   --  1.9  PHOS  --   --  10.2*   GFR: Estimated Creatinine Clearance: 15.2 mL/min (A) (by C-G formula based on SCr of 3.97 mg/dL (H)). Liver Function Tests:  Recent Labs Lab 03/14/17 2200 03/16/17 1025  AST 19 15  ALT 11* 12*  ALKPHOS 58 54  BILITOT 0.2* 0.2*  PROT 4.7* 4.0*  ALBUMIN 1.7* 1.4*  1.4*   No results for input(s): LIPASE, AMYLASE in the last 168 hours. No results for input(s): AMMONIA in the last 168 hours. Coagulation Profile: No results for input(s): INR, PROTIME in the last 168 hours. Cardiac Enzymes:  Recent Labs Lab 03/14/17 2200 03/15/17 0513 03/15/17 1150  TROPONINI 0.03* <0.03 <0.03   BNP (last 3 results) No results for input(s): PROBNP in the  last 8760 hours. HbA1C: No results for input(s): HGBA1C in the last 72 hours. CBG:  Recent Labs Lab 03/15/17 0832  GLUCAP 88   Lipid Profile: No results for input(s): CHOL, HDL, LDLCALC, TRIG, CHOLHDL, LDLDIRECT in the last 72 hours. Thyroid Function Tests:  Recent Labs  03/15/17 0513  TSH 2.893   Anemia Panel:  Recent Labs  03/15/17 1331  TIBC 175*  IRON 92   Sepsis Labs: No results for input(s): PROCALCITON, LATICACIDVEN in the last 168 hours.  No results found for this or any previous visit (from the past 240 hour(s)).   Radiology Studies: Dg Chest 2 View  Result Date: 03/14/2017 CLINICAL DATA:  71 year old female with shortness of breath. EXAM: CHEST  2 VIEW COMPARISON:  Chest radiograph dated 09/23/2016 FINDINGS: Minimal bibasilar densities may represent atelectatic changes. Clinical correlation is recommended. There is no focal consolidation. No large pleural effusion or pneumothorax. The cardiac silhouette is within normal limits. No acute osseous pathology. IMPRESSION: Minimal bibasilar atelectasis.  No focal consolidation. Electronically Signed   By: Anner Crete M.D.   On: 03/14/2017 21:41   Scheduled Meds: . atorvastatin  20 mg Oral q1800  . heparin  5,000 Units Subcutaneous Q8H  . metolazone  10 mg Oral BID  . metoprolol tartrate  50 mg Oral BID  . predniSONE  5 mg Oral Q breakfast  . sodium bicarbonate  1,950 mg Oral TID  . tacrolimus  3 mg Oral BID   Continuous Infusions: . furosemide Stopped (03/16/17 1655)    LOS: 1 day   Kerney Elbe, DO [facility] Pager [phone]  If 7PM-7AM, please contact night-coverage [email or web address] Password [email or web address] 03/16/2017, 5:41 PM

## 2017-03-17 ENCOUNTER — Inpatient Hospital Stay (HOSPITAL_COMMUNITY): Payer: Medicare Other

## 2017-03-17 DIAGNOSIS — N179 Acute kidney failure, unspecified: Secondary | ICD-10-CM

## 2017-03-17 DIAGNOSIS — N185 Chronic kidney disease, stage 5: Secondary | ICD-10-CM

## 2017-03-17 DIAGNOSIS — I10 Essential (primary) hypertension: Secondary | ICD-10-CM

## 2017-03-17 LAB — CBC WITH DIFFERENTIAL/PLATELET
Basophils Absolute: 0 10*3/uL (ref 0.0–0.1)
Basophils Relative: 0 %
EOS PCT: 2 %
Eosinophils Absolute: 0.2 10*3/uL (ref 0.0–0.7)
HEMATOCRIT: 31.8 % — AB (ref 36.0–46.0)
Hemoglobin: 10.5 g/dL — ABNORMAL LOW (ref 12.0–15.0)
LYMPHS ABS: 2.8 10*3/uL (ref 0.7–4.0)
LYMPHS PCT: 34 %
MCH: 32.3 pg (ref 26.0–34.0)
MCHC: 33 g/dL (ref 30.0–36.0)
MCV: 97.8 fL (ref 78.0–100.0)
MONO ABS: 1 10*3/uL (ref 0.1–1.0)
Monocytes Relative: 12 %
NEUTROS ABS: 4.3 10*3/uL (ref 1.7–7.7)
Neutrophils Relative %: 52 %
Platelets: 265 10*3/uL (ref 150–400)
RBC: 3.25 MIL/uL — ABNORMAL LOW (ref 3.87–5.11)
RDW: 13.7 % (ref 11.5–15.5)
WBC: 8.2 10*3/uL (ref 4.0–10.5)

## 2017-03-17 LAB — COMPREHENSIVE METABOLIC PANEL
ALT: 12 U/L — AB (ref 14–54)
AST: 18 U/L (ref 15–41)
Albumin: 1.6 g/dL — ABNORMAL LOW (ref 3.5–5.0)
Alkaline Phosphatase: 57 U/L (ref 38–126)
Anion gap: 11 (ref 5–15)
BILIRUBIN TOTAL: 0.6 mg/dL (ref 0.3–1.2)
BUN: 66 mg/dL — ABNORMAL HIGH (ref 6–20)
CHLORIDE: 115 mmol/L — AB (ref 101–111)
CO2: 14 mmol/L — ABNORMAL LOW (ref 22–32)
CREATININE: 3.89 mg/dL — AB (ref 0.44–1.00)
Calcium: 7.5 mg/dL — ABNORMAL LOW (ref 8.9–10.3)
GFR, EST AFRICAN AMERICAN: 13 mL/min — AB (ref >60)
GFR, EST NON AFRICAN AMERICAN: 11 mL/min — AB (ref >60)
Glucose, Bld: 122 mg/dL — ABNORMAL HIGH (ref 65–99)
POTASSIUM: 3.9 mmol/L (ref 3.5–5.1)
Sodium: 140 mmol/L (ref 135–145)
TOTAL PROTEIN: 4.5 g/dL — AB (ref 6.5–8.1)

## 2017-03-17 LAB — PHOSPHORUS: PHOSPHORUS: 10.2 mg/dL — AB (ref 2.5–4.6)

## 2017-03-17 LAB — MAGNESIUM: MAGNESIUM: 1.8 mg/dL (ref 1.7–2.4)

## 2017-03-17 MED ORDER — LIDOCAINE-PRILOCAINE 2.5-2.5 % EX CREA: 1.0000 "application " | TOPICAL_CREAM | CUTANEOUS | Status: DC | PRN

## 2017-03-17 MED ORDER — SODIUM CHLORIDE 0.9 % IV SOLN: 100.0000 mL | INTRAVENOUS | Status: DC | PRN

## 2017-03-17 MED ORDER — CEFAZOLIN SODIUM-DEXTROSE 1-4 GM/50ML-% IV SOLN
1.0000 g | INTRAVENOUS | Status: AC
  Administered 2017-03-18: 1 g via INTRAVENOUS
  Filled 2017-03-17 (×2): qty 50

## 2017-03-17 MED ORDER — HEPARIN SODIUM (PORCINE) 1000 UNIT/ML DIALYSIS: 1000.0000 [IU] | INTRAMUSCULAR | Status: DC | PRN

## 2017-03-17 MED ORDER — HEPARIN SODIUM (PORCINE) 5000 UNIT/ML IJ SOLN
5000.0000 [IU] | Freq: Three times a day (TID) | INTRAMUSCULAR | Status: AC
  Administered 2017-03-17: 5000 [IU] via SUBCUTANEOUS
  Filled 2017-03-17 (×2): qty 1

## 2017-03-17 MED ORDER — PENTAFLUOROPROP-TETRAFLUOROETH EX AERO: 1.0000 "application " | INHALATION_SPRAY | CUTANEOUS | Status: DC | PRN

## 2017-03-17 MED ORDER — ALTEPLASE 2 MG IJ SOLR: 2.0000 mg | Freq: Once | INTRAMUSCULAR | Status: DC | PRN

## 2017-03-17 MED ORDER — LIDOCAINE HCL (PF) 1 % IJ SOLN: 5.0000 mL | INTRAMUSCULAR | Status: DC | PRN

## 2017-03-17 MED ORDER — HEPARIN SODIUM (PORCINE) 5000 UNIT/ML IJ SOLN
5000.0000 [IU] | Freq: Three times a day (TID) | INTRAMUSCULAR | Status: DC
  Administered 2017-03-19 – 2017-03-21 (×6): 5000 [IU] via SUBCUTANEOUS
  Filled 2017-03-17 (×6): qty 1

## 2017-03-17 NOTE — Progress Notes (Signed)
  Bilateral upper extremity vein mapping complete, please seen CV Proc or Results Review tab for preliminary results. Incidentally noted: Superficial vein thrombosis throughout the right cephalic vein.  Preliminary results called to patients nurse @ 10:20.  Everrett Coombe 03/17/2017, 10:23 AM

## 2017-03-17 NOTE — Consult Note (Signed)
Hospital Consult    Reason for Consult:  [facility] and permanent HD access Requesting Physician:  Hollie Salk MRN #:  [id number]  History of Present Illness: This is a 71 y.o. female who presents with c/o shortness of breath and worsening of BLE swelling.  In March 2018, she was admitted to the hospital with anasarca and found to be related to collapsing focal segmental glomerular sclerosis after kidney biopsy and ATN.  She was treated with lasix and two dialysis sessions before return of her kidney function.  She is followed by Dr. Lorrene Reid.  The pt states she was placed on Prograf to help her kidney function and does not have a transplant.    The pt is on a statin for cholesterol management.  She is on a beta blocker for blood pressure management.  She does smoke 1/2 ppd of cigarettes.   Past Medical History:  Diagnosis Date  . Focal segmental glomerulosclerosis   . Hypertension   . Tobacco abuse     Past Surgical History:  Procedure Laterality Date  . IR GENERIC HISTORICAL  08/15/2016   IR US GUIDE VASC ACCESS RIGHT 08/15/2016 Arne Cleveland, MD [facility]  . IR GENERIC HISTORICAL  08/15/2016   IR FLUORO GUIDE CV LINE RIGHT 08/15/2016 Arne Cleveland, MD [facility]  . TUBAL LIGATION      No Known Allergies  Prior to Admission medications   Medication Sig Start Date End Date Taking? Authorizing Provider  metolazone (ZAROXOLYN) 10 MG tablet Take 10 mg by mouth 2 (two) times daily. 03/05/17  Yes [provider]  metoprolol tartrate (LOPRESSOR) 50 MG tablet Take 50 mg by mouth 2 (two) times daily. 03/05/17  Yes [provider]  multivitamin (RENA-VIT) TABS tablet Take 1 tablet by mouth at bedtime. 08/20/16  Yes Rama, Venetia Maxon, MD  potassium chloride SA (K-DUR,KLOR-CON) 20 MEQ tablet Take 2 tablets (40 mEq total) by mouth 2 (two) times daily. 08/20/16  Yes Rama, Venetia Maxon, MD  predniSONE (DELTASONE) 5 MG tablet Take 5 mg by mouth daily. 03/05/17  Yes [provider]    rosuvastatin (CRESTOR) 20 MG tablet Take 20 mg by mouth at bedtime.  09/11/16  Yes [provider]  tacrolimus (PROGRAF) 5 MG capsule Take 5 mg by mouth 2 (two) times daily. 02/06/17  Yes [provider]  torsemide (DEMADEX) 100 MG tablet Take 100 mg by mouth 2 (two) times daily. 02/27/17  Yes [provider]  Vitamin D, Ergocalciferol, (DRISDOL) 50000 units CAPS capsule Take 50,000 Units by mouth once a week. 01/16/17  Yes [provider]  furosemide (LASIX) 80 MG tablet Take 2 tablets (160 mg total) by mouth 3 (three) times daily. Patient not taking: Reported on 03/15/2017 08/20/16   Rama, Venetia Maxon, MD  nicotine (NICODERM CQ - DOSED IN MG/24 HOURS) 21 mg/24hr patch Place 1 patch (21 mg total) onto the skin daily. Patient not taking: Reported on 03/15/2017 08/20/16   Rama, Venetia Maxon, MD    Social History   Social History  . Marital status: Widowed    Spouse name: N/A  . Number of children: N/A  . Years of education: N/A   Occupational History  . Not on file.   Social History Main Topics  . Smoking status: Current Some Day Smoker    Packs/day: 0.50    Types: Cigarettes  . Smokeless tobacco: Never Used  . Alcohol use No  . Drug use: No  . Sexual activity: No   Other Topics  Concern  . Not on file   Social History Narrative  . No narrative on file     Family History  Problem Relation Age of Onset  . Hypertension Mother   . Diabetes Mother   . Hypertension Father   . Diabetes Father   . Hypertension Sister   . Hypertension Brother     ROS: [x]  Positive   [ ]  Negative   [ ]  All sytems reviewed and are negative  Cardiac: []  chest pain/pressure []  palpitations []  SOB lying flat []  DOE  Vascular: []  pain in legs while walking []  pain in legs at rest []  pain in legs at night []  non-healing ulcers []  hx of DVT [x]  swelling in legs  Pulmonary: []  productive cough []  asthma/wheezing []  home O2  Neurologic: []  weakness in []   arms []  legs []  numbness in []  arms []  legs []  hx of CVA []  mini stroke [] difficulty speaking or slurred speech []  temporary loss of vision in one eye []  dizziness  Hematologic: []  hx of cancer []  bleeding problems []  problems with blood clotting easily [x]  anemia of chronic disease  Endocrine:   []  diabetes []  thyroid disease  GI []  vomiting blood []  blood in stool  GU: [x]  CKD/renal failure []  HD--[]  M/W/F or []  T/T/S []  burning with urination []  blood in urine  Psychiatric: []  anxiety []  depression  Musculoskeletal: []  arthritis []  joint pain  Integumentary: []  rashes []  ulcers  Constitutional: []  fever []  chills   Physical Examination  Vitals:   03/17/17 0421 03/17/17 0900  BP: (!) 150/84 137/90  Pulse: 78 76  Resp: 15 18  Temp: 98 F (36.7 C) 98 F (36.7 C)  SpO2: 100% 100%   Body mass index is 33.34 kg/m.  General:  WDWN in NAD Gait: Not observed HENT: WNL, normocephalic Pulmonary: normal non-labored breathing Cardiac: regular Skin: without rashes Vascular Exam/Pulses: 2+ left radial pulse Extremities:  Swelling BLE Musculoskeletal: no muscle wasting or atrophy  Neurologic: A&O X 3;  No focal weakness or paresthesias are detected; speech is fluent/normal Psychiatric:  The pt has Normal affect.   CBC    Component Value Date/Time   WBC 8.2 03/17/2017 0730   RBC 3.25 (L) 03/17/2017 0730   HGB 10.5 (L) 03/17/2017 0730   HCT 31.8 (L) 03/17/2017 0730   PLT 265 03/17/2017 0730   MCV 97.8 03/17/2017 0730   MCH 32.3 03/17/2017 0730   MCHC 33.0 03/17/2017 0730   RDW 13.7 03/17/2017 0730   LYMPHSABS 2.8 03/17/2017 0730   MONOABS 1.0 03/17/2017 0730   EOSABS 0.2 03/17/2017 0730   BASOSABS 0.0 03/17/2017 0730    BMET    Component Value Date/Time   NA 140 03/17/2017 0730   K 3.9 03/17/2017 0730   CL 115 (H) 03/17/2017 0730   CO2 14 (L) 03/17/2017 0730   GLUCOSE 122 (H) 03/17/2017 0730   BUN 66 (H) 03/17/2017 0730   CREATININE  3.89 (H) 03/17/2017 0730   CALCIUM 7.5 (L) 03/17/2017 0730   GFRNONAA 11 (L) 03/17/2017 0730   GFRAA 13 (L) 03/17/2017 0730    COAGS: Lab Results  Component Value Date   INR 1.04 09/23/2016   INR 1.07 08/08/2016     Non-Invasive Vascular Imaging:   Vein mapping 03/17/17:  Adequate for left arm cephalic vein fistula  Statin:  Yes.   Beta Blocker:  Yes.   Aspirin:  No. ACEI:  No. ARB:  No. CCB use:  No Other antiplatelets/anticoagulants:  Yes.  SQ heparin   ASSESSMENT/PLAN: This is a 70 y.o. female with AKI on CKD 4 in need of dialysis access (permanent and TDC).   -vein mapping reveals left arm cephalic vein is adequate for fistula.  Restrict left arm. -will schedule for tomorrow afternoon, there is a chance that this will need to be rescheduled for later in the week.  Pt understands and wishes to proceed.  Npo/consent/labs. -hold SQ heparin after this evening's dose.   Leontine Locket, PA-C [facility] [phone]   I agree with the above.  I have seen and evaluated the patient.  She is scheduled to start dialysis.  We discussed placing a tunneled catheter.  I have also reviewed her vein mapping.  She appears to have an adequate left cephalic vein.  She is right-handed.  She has a palpable left brachial and radial pulse.I discussed proceeding with a left radiocephalic or brachiocephalic fistula.  I discussed the risks and benefits of the operation with the patient including the risk of non-maturity, the need for future interventions, as well as the risk of steal syndrome.  She understands this and is willing to proceed.  This was scheduled for tomorrow October 31.  Annamarie Major

## 2017-03-17 NOTE — Progress Notes (Addendum)
PROGRESS NOTE    Mary Chapman  OZH:[id number] DOB: [birth year] DOA: 03/14/2017 PCP: [facility]   Brief Narrative:  Mary Chapman is a 71 y.o. female with medical history significant of HTN, tobacco abuse, and focal segmental glomerularsclerosis; who presents with complaints of shortness of breath on exertion and worsening lower extremity swelling. Back in 07/2016 the patient was admitted to the hospital with anasarca found to be related to collapsing focal segmental glomerular sclerosis after kidney biopsy and ATN. She was treated with high-dose Lasix and required two sessions of hemodialysis before return of her kidney function. Since that time she had been being followed by Dr. Waynette Buttery of nephrology. Patient notes that her Torsemide dose was decreased from 80 mg TID down to 100 mg twice daily on October 18 due to with the patient reported as decreased kidney function. Since her change in dosage patient notes being significantly short of breath with any kind of activity. Associated symptoms include wheezing, nausea, and loose watery stools.  Her and her Nephrologist had recently discussed her likely need of being placed on hemodialysis for which she was scheduled to have an appointment with vascular surgery on November 1 for vascular access. Patient was given 30 g of Kayexalate, 1 g of calcium gluconate, and 160 mg of Lasix IV on admission and transferred to [facility] for further evaluation.   Nephrology consulted and are aggressively diuresing the patient. Patient's Labs not improving much so patient will need Dialysis Access [facility] and permanent HD Access) so Vascular Surgery consulted. Patient underwent vein mapping and patient was found to have Superficial Vein Thrombus throughout the Right Cephalic Vein so Left Arm Cephalic Vein will be used for fistula. Plan is for Dialysis after [facility] and Fistula placement.   Assessment & Plan:   Principal Problem:   Acute renal failure superimposed on  chronic kidney disease (HCC) Active Problems:   Anasarca   Tobacco abuse   Essential hypertension   AKI (acute kidney injury) ([place])   Dyspnea   Nephrotic syndrome   Acute kidney injury superimposed on chronic kidney disease (HCC)   Focal segmental glomerulosclerosis   Elevated troponin   Hyperlipidemia   SOB (shortness of breath)  Acute Kidney Injury on CKD Stage 4 in the setting of FSGS, Collapsing Varient but unclear Etiology now likely requiring Dialysis  -Has Hx of 3 Hemodialysis Treatments -Had Worsening Cr and Anasarca in the setting of Hypoalbuminemia -BUN/Cr went from 79/4.31 -> 81/4.08 -> 68/3.97 -> 66/3.89 -Nephrology Consulted Appreciate Recc's -Nephrology ordering Vein Mapping and Checking Prograf Level -C/w Furosemide IV 160 mg q6h and with Metolazone 10 mg po BID -C/w Tacrolimuss 3 mg po BID, Prednisone 5 mg po Daily,  -Repeat CMP in AM  -Strict I's/O's, Daily Weights -Hepatitis B and C Negative -Intact PTH 61 -Urinalysis showing Cloudy Urine, Moderate Hgb, >300 Protein, Few Bacteria, Granular and Hyaline Casts -Nephrology having Dialysis talks with the patient and because not improving will likely need it -Patient underwent Vein Mapping and was found to have Superficial Vein Thrombus throughout the Right Cephalic Vein so Left Arm Cephalic Vein will be used for fistula.  -Vascular Surgery consulted and have scheduled permanent and TDC for tomorrow but per them there could be a chance that this will be rescheduled for later in the week; Patient will be NPO after midnight and will hold sq heparin after tonight's dose -Sees Dr. Buelah Manis and will need to follow up with her after Discharge  SOB 2/2 Volume Overload and Above -  Continues to complain of SOB -CXR shows Minimal bibasilar densities may represent atelectatic changes. Clinical correlation is recommended. There is no focal consolidation. No large pleural effusion or pneumothorax. The cardiac silhouette is within  normal limits. No acute osseous pathology. -C/w IV Diuresis as above; Patient to likely start diuresis after TDC and Fistula Placement.  -C/w DuoNeb 3 mL q4hprn for Wheezing/SOB  Anasarca -See Above; Still remains quite edematous  -C/w Diuresis  Hyperkalemia, improved -Patient's K+ was 7.0 in the setting of Renal Failure -Given Sodium Polystyrene 60 grams po once yesterday along with IV Calcium Gluconate and Insulin 10 units IV -Hold Potassium Supplementation  -Repeat K+ was 3.9 -Continue to Monitor and Repeat CMP in AM  Elevated Troponin -Acute. Initial troponin noted to be 0.03. -Trended cardiac Troponin's and repeat were <0.03 x2 -Denies Chest Pain  Macrocytic Anemia and Anemia of Chronic Kidney Disease -Anemia Panel showed Iron of 92, UIBC of 83, TIBC of 175, Saturations of 53 -Hb/Hct went from 10.0/31.0 -> 9.9/29.6 -> 9.3/28.6 -> 10.5/31.8 -Hemoglobin noted to be 10 on admission with elevated MCV and MCH. -Check Vtamin B12 and Folate   Essential Hypertension -Continue Metoprolol 50 mg po BID -When Necessary Add IV Hydralazine prn  Hyperlipidemia -Check Lipid Panel in AM -Patient appears to be on 2 statin medications at this time. She notes that she may have been prescribed one from her nephrologist and one from her primary doctor. -C/w Atorvastatin 20 mg po qHS  Hx of Tobacco Abuse -Patient reports quitting smoking at some point following her last hospitalization.  Hyperphosphatemia -Patient's Phos was 10.2 this AM -Continue to Monitor and will likely be corrected in Dialysis   DVT prophylaxis: Heparin 5,000 units sq q8h Code Status: FULL CODE Family Communication: No family present at bedside Disposition Plan: Remain Inpatient for Dialysis Access  Consultants:   Nephrology Dr. Deterding/Dr. Hollie Salk  Vascular Surgery    Procedures:  Vein Mapping    Antimicrobials:  Anti-infectives    Start     Dose/Rate Route Frequency Ordered Stop   03/18/17 0600   ceFAZolin (ANCEF) IVPB 1 g/50 mL premix    Comments:  Send with pt to OR   1 g 100 mL/hr over 30 Minutes Intravenous To Surgery 03/17/17 1436 03/19/17 0600     Subjective: Seen and examined at beside after Vein Mapping and stated she was still SOB. No N/V. States she is still quite swollen. No CP. No other concerns or complaints at this time.   Objective: Vitals:   03/16/17 2207 03/17/17 0332 03/17/17 0421 03/17/17 0900  BP:   (!) 150/84 137/90  Pulse:   78 76  Resp:   15 18  Temp:   98 F (36.7 C) 98 F (36.7 C)  TempSrc:    Oral  SpO2:   100% 100%  Weight: 93.7 kg (206 lb 9.6 oz) 93.7 kg (206 lb 9.1 oz)    Height:        Intake/Output Summary (Last 24 hours) at 03/17/17 1526 Last data filed at 03/17/17 1359  Gross per 24 hour  Intake              756 ml  Output              100 ml  Net              656 ml   Filed Weights   03/15/17 0233 03/16/17 2207 03/17/17 0332  Weight: 93 kg (205 lb 0.4 oz) 93.7 kg (  206 lb 9.6 oz) 93.7 kg (206 lb 9.1 oz)   Examination: Physical Exam:  Constitutional: WN/WD obese AAF in NAD and appears calm.  Eyes: Sclerae anicteric. Lids normal ENMT: External Ears and nose appear normal Neck: Appears supple with no appreciable masses. No JVD Respiratory: Diminished to auscultation bibasilar breath sounds. Has some crackles but no appreciable wheezing/rales/rhonchi. Patient was not tachypenic or using any accessory muscles to breathe Cardiovascular: RRR; Has a 2/6 Systolic Murmur. S1 S2. Has 3+ LE Edema Abdomen: Soft, NT, Distended due to body habitus. Has some abdominal swelling. No appreciable masses GU: Deferred Musculoskeletal: No contractures. No cyanosis Skin: No rashes or lesions on a limited skin eval. Has significant 3+ LE edema Neurologic: CN 2-12 grossly intact. No appreciable focal deficits.  Psychiatric: Normal mood and affect. Intact judgement and insight.  Data Reviewed: I have personally reviewed following labs and imaging  studies  CBC:  Recent Labs Lab 03/14/17 2200 03/15/17 0513 03/16/17 1025 03/17/17 0730  WBC 10.3 8.8 6.7 8.2  NEUTROABS 8.1*  --  4.2 4.3  HGB 10.0* 9.9* 9.3* 10.5*  HCT 31.0* 29.6* 28.6* 31.8*  MCV 101.3* 99.0 99.3 97.8  PLT 279 292 247 341   Basic Metabolic Panel:  Recent Labs Lab 03/14/17 2200 03/15/17 0513 03/16/17 1025 03/17/17 0730  NA 135 141 140 140  K 6.7* 7.0* 4.2 3.9  CL 117* 119* 117* 115*  CO2 14* 14* 15* 14*  GLUCOSE 99 102* 102* 122*  BUN 79* 81* 68* 66*  CREATININE 4.31* 4.08* 3.97* 3.89*  CALCIUM 8.0* 8.3* 7.7* 7.5*  MG  --   --  1.9 1.8  PHOS  --   --  10.2* 10.2*   GFR: Estimated Creatinine Clearance: 15.5 mL/min (A) (by C-G formula based on SCr of 3.89 mg/dL (H)). Liver Function Tests:  Recent Labs Lab 03/14/17 2200 03/16/17 1025 03/17/17 0730  AST 19 15 18   ALT 11* 12* 12*  ALKPHOS 58 54 57  BILITOT 0.2* 0.2* 0.6  PROT 4.7* 4.0* 4.5*  ALBUMIN 1.7* 1.4*  1.4* 1.6*   No results for input(s): LIPASE, AMYLASE in the last 168 hours. No results for input(s): AMMONIA in the last 168 hours. Coagulation Profile: No results for input(s): INR, PROTIME in the last 168 hours. Cardiac Enzymes:  Recent Labs Lab 03/14/17 2200 03/15/17 0513 03/15/17 1150  TROPONINI 0.03* <0.03 <0.03   BNP (last 3 results) No results for input(s): PROBNP in the last 8760 hours. HbA1C: No results for input(s): HGBA1C in the last 72 hours. CBG:  Recent Labs Lab 03/15/17 0832  GLUCAP 88   Lipid Profile: No results for input(s): CHOL, HDL, LDLCALC, TRIG, CHOLHDL, LDLDIRECT in the last 72 hours. Thyroid Function Tests:  Recent Labs  03/15/17 0513  TSH 2.893   Anemia Panel:  Recent Labs  03/15/17 1331  TIBC 175*  IRON 92   Sepsis Labs: No results for input(s): PROCALCITON, LATICACIDVEN in the last 168 hours.  No results found for this or any previous visit (from the past 240 hour(s)).   Radiology Studies: Dg Chest Port 1 View  Result  Date: 03/17/2017 CLINICAL DATA:  71 year old female with shortness of Breath on exertion. Anasarca, glomerulosclerosis, acute on chronic renal failure. EXAM: PORTABLE CHEST 1 VIEW COMPARISON:  03/14/2017 and earlier. FINDINGS: Portable AP semi upright view at 0630 hours. Stable mild cardiomegaly and mediastinal contours. Mildly lower lung volumes. No pneumothorax. Stable pulmonary vascular congestion without overt edema. Possible trace fluid along the right minor fissure,  versus mild atelectasis. Small if any layering pleural effusions. No other confluent opacity. Visualized tracheal air column is within normal limits. IMPRESSION: Mildly lower lung volumes. Stable vascular congestion without overt edema. Possible small effusions. Electronically Signed   By: Genevie Ann M.D.   On: 03/17/2017 08:52   Scheduled Meds: . atorvastatin  20 mg Oral q1800  . heparin  5,000 Units Subcutaneous Q8H  . [START ON 03/19/2017] heparin subcutaneous  5,000 Units Subcutaneous Q8H  . metolazone  10 mg Oral BID  . metoprolol tartrate  50 mg Oral BID  . predniSONE  5 mg Oral Q breakfast  . sodium bicarbonate  1,950 mg Oral TID  . tacrolimus  3 mg Oral BID   Continuous Infusions: . [START ON 03/18/2017]  ceFAZolin (ANCEF) IV    . furosemide 160 mg (03/17/17 1457)    LOS: 2 days   Kerney Elbe, DO [facility] Pager [phone]  If 7PM-7AM, please contact night-coverage [email or web address] Password [email or web address] 03/17/2017, 3:26 PM

## 2017-03-17 NOTE — Care Management Note (Signed)
Case Management Note  Patient Details  Name: Mary Chapman MRN: [id number] Date of Birth: 1945/10/25  Subjective/Objective:                 Spoke w patient at the bedside. Discussed CM consult for trouble affording medications. Patient clarified that she does not have trouble affording medications, she thought she was accepting resources admission nurse was offering to lower cost of medications. Upon reviewing medications and insurance there are none available and this was explained to patient. Patient independent pta. No CM needs identified at this time. Will continue to follow.    Action/Plan:   Expected Discharge Date:  03/21/17               Expected Discharge Plan:  Home/Self Care  In-House Referral:     Discharge planning Services  CM Consult  Post Acute Care Choice:    Choice offered to:     DME Arranged:    DME Agency:     HH Arranged:    HH Agency:     Status of Service:  In process, will continue to follow  If discussed at Long Length of Stay Meetings, dates discussed:    Additional Comments:  Carles Collet, RN 03/17/2017, 11:21 AM

## 2017-03-17 NOTE — Progress Notes (Signed)
[facility] Progress Note    Assessment/ Plan:   1. AKI on CKD 4 from FSGS- followed by Dr Lorrene Reid.  Was having talks to get vasc access and prep for HD per pt. Vein mapping complete, c/s VVS, will need to initiate HD for vol removal. AKI possibly hemodyamically mediated vs progression of disease.  Continue prednisone 5 mg and prograf 3 mg BID.     2. Hypertension: improving; on lopressor and high-dose Lasix with metolazone  3. Anemia of ESRD: Hgb 10.5, will initiate ESA when appropriate  4. Metabolic Bone Disease: PTH 61  Subjective:    High-dose Lasix not helping vol overload.  Discussed dialysis- willing to proceed.     Objective:   BP 137/90 (BP Location: Right Wrist)   Pulse 76   Temp 98 F (36.7 C) (Oral)   Resp 18   Ht 5\' 6"  (1.676 m)   Wt 93.7 kg (206 lb 9.1 oz)   SpO2 100%   BMI 33.34 kg/m   Intake/Output Summary (Last 24 hours) at 03/17/17 1602 Last data filed at 03/17/17 1359  Gross per 24 hour  Intake              756 ml  Output              100 ml  Net              656 ml   Weight change:   Physical Exam: GEN NAD, lying in bed HEENT sclerae anicteric NECK no JVD PULM normal WOB, bibasilar crackles CV RRR systolic murmur ABD + mildly distended EXT 3+ LE edema NEURO AAO x 3  Imaging: Dg Chest Port 1 View  Result Date: 03/17/2017 CLINICAL DATA:  71 year old female with shortness of Breath on exertion. Anasarca, glomerulosclerosis, acute on chronic renal failure. EXAM: PORTABLE CHEST 1 VIEW COMPARISON:  03/14/2017 and earlier. FINDINGS: Portable AP semi upright view at 0630 hours. Stable mild cardiomegaly and mediastinal contours. Mildly lower lung volumes. No pneumothorax. Stable pulmonary vascular congestion without overt edema. Possible trace fluid along the right minor fissure, versus mild atelectasis. Small if any layering pleural effusions. No other confluent opacity. Visualized tracheal air column is within normal limits. IMPRESSION:  Mildly lower lung volumes. Stable vascular congestion without overt edema. Possible small effusions. Electronically Signed   By: Genevie Ann M.D.   On: 03/17/2017 08:52    Labs: BMET  Recent Labs Lab 03/14/17 2200 03/15/17 0513 03/16/17 1025 03/17/17 0730  NA 135 141 140 140  K 6.7* 7.0* 4.2 3.9  CL 117* 119* 117* 115*  CO2 14* 14* 15* 14*  GLUCOSE 99 102* 102* 122*  BUN 79* 81* 68* 66*  CREATININE 4.31* 4.08* 3.97* 3.89*  CALCIUM 8.0* 8.3* 7.7* 7.5*  PHOS  --   --  10.2* 10.2*   CBC  Recent Labs Lab 03/14/17 2200 03/15/17 0513 03/16/17 1025 03/17/17 0730  WBC 10.3 8.8 6.7 8.2  NEUTROABS 8.1*  --  4.2 4.3  HGB 10.0* 9.9* 9.3* 10.5*  HCT 31.0* 29.6* 28.6* 31.8*  MCV 101.3* 99.0 99.3 97.8  PLT 279 292 247 265    Medications:    . atorvastatin  20 mg Oral q1800  . heparin  5,000 Units Subcutaneous Q8H  . [START ON 03/19/2017] heparin subcutaneous  5,000 Units Subcutaneous Q8H  . metolazone  10 mg Oral BID  . metoprolol tartrate  50 mg Oral BID  . predniSONE  5 mg Oral Q breakfast  . sodium  bicarbonate  1,950 mg Oral TID  . tacrolimus  3 mg Oral BID      Madelon Lips MD [facility] pgr [phone] 03/17/2017, 4:02 PM

## 2017-03-17 NOTE — Progress Notes (Signed)
Text paged M.D about the superficial thrombus.

## 2017-03-18 ENCOUNTER — Encounter (HOSPITAL_COMMUNITY): Payer: Self-pay | Admitting: Certified Registered"

## 2017-03-18 ENCOUNTER — Inpatient Hospital Stay (HOSPITAL_COMMUNITY): Payer: Medicare Other

## 2017-03-18 ENCOUNTER — Telehealth: Payer: Self-pay | Admitting: Vascular Surgery

## 2017-03-18 ENCOUNTER — Inpatient Hospital Stay (HOSPITAL_COMMUNITY): Payer: Medicare Other | Admitting: Anesthesiology

## 2017-03-18 ENCOUNTER — Encounter (HOSPITAL_COMMUNITY)
Admission: EM | Disposition: A | Discharge status: Home / Self Care | Payer: Self-pay | Source: Home / Self Care | Attending: Internal Medicine

## 2017-03-18 DIAGNOSIS — Z09 Encounter for follow-up examination after completed treatment for conditions other than malignant neoplasm: Secondary | ICD-10-CM

## 2017-03-18 HISTORY — PX: AV FISTULA PLACEMENT: SHX1204

## 2017-03-18 HISTORY — PX: INSERTION OF DIALYSIS CATHETER: SHX1324

## 2017-03-18 LAB — CBC WITH DIFFERENTIAL/PLATELET
BASOS PCT: 0 %
Basophils Absolute: 0 10*3/uL (ref 0.0–0.1)
Eosinophils Absolute: 0.1 10*3/uL (ref 0.0–0.7)
Eosinophils Relative: 2 %
HEMATOCRIT: 26.7 % — AB (ref 36.0–46.0)
HEMOGLOBIN: 9 g/dL — AB (ref 12.0–15.0)
LYMPHS PCT: 34 %
Lymphs Abs: 2.2 10*3/uL (ref 0.7–4.0)
MCH: 32.3 pg (ref 26.0–34.0)
MCHC: 33.7 g/dL (ref 30.0–36.0)
MCV: 95.7 fL (ref 78.0–100.0)
MONO ABS: 0.7 10*3/uL (ref 0.1–1.0)
MONOS PCT: 10 %
NEUTROS ABS: 3.6 10*3/uL (ref 1.7–7.7)
NEUTROS PCT: 54 %
Platelets: 230 10*3/uL (ref 150–400)
RBC: 2.79 MIL/uL — ABNORMAL LOW (ref 3.87–5.11)
RDW: 13.8 % (ref 11.5–15.5)
WBC: 6.7 10*3/uL (ref 4.0–10.5)

## 2017-03-18 LAB — MAGNESIUM: Magnesium: 1.7 mg/dL (ref 1.7–2.4)

## 2017-03-18 LAB — COMPREHENSIVE METABOLIC PANEL
ALK PHOS: 48 U/L (ref 38–126)
ALT: 10 U/L — ABNORMAL LOW (ref 14–54)
ANION GAP: 10 (ref 5–15)
AST: 15 U/L (ref 15–41)
Albumin: 1.4 g/dL — ABNORMAL LOW (ref 3.5–5.0)
BILIRUBIN TOTAL: 0.4 mg/dL (ref 0.3–1.2)
BUN: 68 mg/dL — ABNORMAL HIGH (ref 6–20)
CALCIUM: 7.1 mg/dL — AB (ref 8.9–10.3)
CO2: 15 mmol/L — ABNORMAL LOW (ref 22–32)
CREATININE: 3.84 mg/dL — AB (ref 0.44–1.00)
Chloride: 115 mmol/L — ABNORMAL HIGH (ref 101–111)
GFR, EST AFRICAN AMERICAN: 13 mL/min — AB (ref >60)
GFR, EST NON AFRICAN AMERICAN: 11 mL/min — AB (ref >60)
Glucose, Bld: 94 mg/dL (ref 65–99)
Potassium: 3.7 mmol/L (ref 3.5–5.1)
SODIUM: 140 mmol/L (ref 135–145)
TOTAL PROTEIN: 3.9 g/dL — AB (ref 6.5–8.1)

## 2017-03-18 LAB — PHOSPHORUS: PHOSPHORUS: 9.7 mg/dL — AB (ref 2.5–4.6)

## 2017-03-18 LAB — LIPID PANEL
CHOLESTEROL: 219 mg/dL — AB (ref 0–200)
HDL: 63 mg/dL (ref >40)
LDL Cholesterol: 110 mg/dL — ABNORMAL HIGH (ref 0–99)
TRIGLYCERIDES: 229 mg/dL — AB (ref <150)
Total CHOL/HDL Ratio: 3.5 RATIO
VLDL: 46 mg/dL — AB (ref 0–40)

## 2017-03-18 LAB — PROTIME-INR
INR: 0.97
PROTHROMBIN TIME: 12.8 s (ref 11.4–15.2)

## 2017-03-18 LAB — SURGICAL PCR SCREEN
MRSA, PCR: NEGATIVE
Staphylococcus aureus: NEGATIVE

## 2017-03-18 LAB — TACROLIMUS LEVEL: TACROLIMUS (FK506) - LABCORP: 1.4 ng/mL — AB (ref 2.0–20.0)

## 2017-03-18 SURGERY — ARTERIOVENOUS (AV) FISTULA CREATION
Anesthesia: Monitor Anesthesia Care | Site: Arm Upper

## 2017-03-18 MED ORDER — SODIUM CHLORIDE 0.9 % IV SOLN
INTRAVENOUS | Status: DC | PRN
  Administered 2017-03-18: 500 mL

## 2017-03-18 MED ORDER — LIDOCAINE HCL (PF) 1 % IJ SOLN
INTRAMUSCULAR | Status: DC | PRN
  Administered 2017-03-18: 20 mL

## 2017-03-18 MED ORDER — DEXAMETHASONE SODIUM PHOSPHATE 10 MG/ML IJ SOLN
INTRAMUSCULAR | Status: AC
  Filled 2017-03-18: qty 1

## 2017-03-18 MED ORDER — HEPARIN SODIUM (PORCINE) 1000 UNIT/ML IJ SOLN
INTRAMUSCULAR | Status: DC | PRN
  Administered 2017-03-18: 3000 [IU] via INTRAVENOUS

## 2017-03-18 MED ORDER — HEPARIN SODIUM (PORCINE) 1000 UNIT/ML IJ SOLN
INTRAMUSCULAR | Status: AC
  Filled 2017-03-18: qty 1

## 2017-03-18 MED ORDER — 0.9 % SODIUM CHLORIDE (POUR BTL) OPTIME
TOPICAL | Status: DC | PRN
  Administered 2017-03-18: 1000 mL

## 2017-03-18 MED ORDER — HEPARIN SODIUM (PORCINE) 1000 UNIT/ML IJ SOLN
INTRAMUSCULAR | Status: DC | PRN
  Administered 2017-03-18: 1000 [IU]

## 2017-03-18 MED ORDER — DEXAMETHASONE SODIUM PHOSPHATE 10 MG/ML IJ SOLN
INTRAMUSCULAR | Status: DC | PRN
  Administered 2017-03-18: 10 mg via INTRAVENOUS

## 2017-03-18 MED ORDER — FENTANYL CITRATE (PF) 100 MCG/2ML IJ SOLN: 25.0000 ug | INTRAMUSCULAR | Status: DC | PRN

## 2017-03-18 MED ORDER — FENTANYL CITRATE (PF) 250 MCG/5ML IJ SOLN
INTRAMUSCULAR | Status: AC
  Filled 2017-03-18: qty 5

## 2017-03-18 MED ORDER — ONDANSETRON HCL 4 MG/2ML IJ SOLN: 4.0000 mg | Freq: Once | INTRAMUSCULAR | Status: DC | PRN

## 2017-03-18 MED ORDER — GLYCOPYRROLATE 0.2 MG/ML IV SOSY
PREFILLED_SYRINGE | INTRAVENOUS | Status: DC | PRN
  Administered 2017-03-18: .2 mg via INTRAVENOUS

## 2017-03-18 MED ORDER — ONDANSETRON HCL 4 MG/2ML IJ SOLN
INTRAMUSCULAR | Status: AC
  Filled 2017-03-18: qty 2

## 2017-03-18 MED ORDER — PHENYLEPHRINE HCL 10 MG/ML IJ SOLN
INTRAVENOUS | Status: DC | PRN
  Administered 2017-03-18: 5 ug/min via INTRAVENOUS

## 2017-03-18 MED ORDER — LIDOCAINE 2% (20 MG/ML) 5 ML SYRINGE
INTRAMUSCULAR | Status: DC | PRN
  Administered 2017-03-18: 80 mg via INTRAVENOUS

## 2017-03-18 MED ORDER — FENTANYL CITRATE (PF) 100 MCG/2ML IJ SOLN
INTRAMUSCULAR | Status: DC | PRN
  Administered 2017-03-18: 25 ug via INTRAVENOUS

## 2017-03-18 MED ORDER — LIDOCAINE HCL (PF) 1 % IJ SOLN
INTRAMUSCULAR | Status: AC
  Filled 2017-03-18: qty 30

## 2017-03-18 MED ORDER — PROPOFOL 500 MG/50ML IV EMUL
INTRAVENOUS | Status: DC | PRN
  Administered 2017-03-18: 150 ug/kg/min via INTRAVENOUS

## 2017-03-18 MED ORDER — ONDANSETRON HCL 4 MG/2ML IJ SOLN
INTRAMUSCULAR | Status: DC | PRN
  Administered 2017-03-18: 4 mg via INTRAVENOUS

## 2017-03-18 MED ORDER — SODIUM CHLORIDE 0.9 % IV SOLN
INTRAVENOUS | Status: DC
  Administered 2017-03-18: 10:00:00 via INTRAVENOUS

## 2017-03-18 MED ORDER — LIDOCAINE 2% (20 MG/ML) 5 ML SYRINGE
INTRAMUSCULAR | Status: AC
  Filled 2017-03-18: qty 5

## 2017-03-18 SURGICAL SUPPLY — 54 items
ARMBAND PINK RESTRICT EXTREMIT (MISCELLANEOUS) ×6 IMPLANT
BAG DECANTER FOR FLEXI CONT (MISCELLANEOUS) ×3 IMPLANT
BIOPATCH RED 1 DISK 7.0 (GAUZE/BANDAGES/DRESSINGS) ×3 IMPLANT
CANISTER SUCT 3000ML PPV (MISCELLANEOUS) ×3 IMPLANT
CANNULA VESSEL 3MM 2 BLNT TIP (CANNULA) ×3 IMPLANT
CATH PALINDROME RT-P 15FX19CM (CATHETERS) IMPLANT
CATH PALINDROME RT-P 15FX23CM (CATHETERS) ×3 IMPLANT
CATH PALINDROME RT-P 15FX28CM (CATHETERS) IMPLANT
CATH PALINDROME RT-P 15FX55CM (CATHETERS) IMPLANT
CATH STRAIGHT 5FR 65CM (CATHETERS) IMPLANT
CHLORAPREP W/TINT 26ML (MISCELLANEOUS) ×3 IMPLANT
CLIP VESOCCLUDE MED 6/CT (CLIP) ×3 IMPLANT
CLIP VESOCCLUDE SM WIDE 6/CT (CLIP) ×3 IMPLANT
COVER PROBE W GEL 5X96 (DRAPES) IMPLANT
COVER SURGICAL LIGHT HANDLE (MISCELLANEOUS) ×3 IMPLANT
DECANTER SPIKE VIAL GLASS SM (MISCELLANEOUS) ×3 IMPLANT
DERMABOND ADVANCED (GAUZE/BANDAGES/DRESSINGS) ×2
DERMABOND ADVANCED .7 DNX12 (GAUZE/BANDAGES/DRESSINGS) ×4 IMPLANT
DRAIN PENROSE 1/4X12 LTX STRL (WOUND CARE) ×3 IMPLANT
DRAPE C-ARM 42X72 X-RAY (DRAPES) ×3 IMPLANT
DRAPE CHEST BREAST 15X10 FENES (DRAPES) ×3 IMPLANT
DRSG COVADERM 4X6 (GAUZE/BANDAGES/DRESSINGS) ×3 IMPLANT
ELECT REM PT RETURN 9FT ADLT (ELECTROSURGICAL) ×3
ELECTRODE REM PT RTRN 9FT ADLT (ELECTROSURGICAL) ×2 IMPLANT
GAUZE SPONGE 4X4 16PLY XRAY LF (GAUZE/BANDAGES/DRESSINGS) ×3 IMPLANT
GLOVE BIO SURGEON STRL SZ7.5 (GLOVE) ×3 IMPLANT
GOWN STRL REUS W/ TWL LRG LVL3 (GOWN DISPOSABLE) ×6 IMPLANT
GOWN STRL REUS W/TWL LRG LVL3 (GOWN DISPOSABLE) ×3
KIT BASIN OR (CUSTOM PROCEDURE TRAY) ×3 IMPLANT
KIT ROOM TURNOVER OR (KITS) ×3 IMPLANT
LOOP VESSEL MINI RED (MISCELLANEOUS) IMPLANT
NEEDLE 18GX1X1/2 (RX/OR ONLY) (NEEDLE) ×3 IMPLANT
NEEDLE HYPO 25GX1X1/2 BEV (NEEDLE) ×3 IMPLANT
NS IRRIG 1000ML POUR BTL (IV SOLUTION) ×3 IMPLANT
PACK CV ACCESS (CUSTOM PROCEDURE TRAY) ×3 IMPLANT
PACK SURGICAL SETUP 50X90 (CUSTOM PROCEDURE TRAY) ×3 IMPLANT
PAD ARMBOARD 7.5X6 YLW CONV (MISCELLANEOUS) ×6 IMPLANT
SET MICROPUNCTURE 5F STIFF (MISCELLANEOUS) IMPLANT
SPONGE SURGIFOAM ABS GEL 100 (HEMOSTASIS) IMPLANT
SUT ETHILON 3 0 PS 1 (SUTURE) ×3 IMPLANT
SUT PROLENE 6 0 BV (SUTURE) ×3 IMPLANT
SUT PROLENE 7 0 BV 1 (SUTURE) ×3 IMPLANT
SUT VIC AB 3-0 SH 27 (SUTURE) ×1
SUT VIC AB 3-0 SH 27X BRD (SUTURE) ×2 IMPLANT
SUT VICRYL 4-0 PS2 18IN ABS (SUTURE) ×3 IMPLANT
SYR 10ML LL (SYRINGE) ×3 IMPLANT
SYR 20CC LL (SYRINGE) ×6 IMPLANT
SYR 5ML LL (SYRINGE) ×3 IMPLANT
SYR CONTROL 10ML LL (SYRINGE) ×3 IMPLANT
TOWEL GREEN STERILE (TOWEL DISPOSABLE) ×3 IMPLANT
TOWEL GREEN STERILE FF (TOWEL DISPOSABLE) ×3 IMPLANT
UNDERPAD 30X30 (UNDERPADS AND DIAPERS) ×3 IMPLANT
WATER STERILE IRR 1000ML POUR (IV SOLUTION) ×3 IMPLANT
WIRE AMPLATZ SS-J .035X180CM (WIRE) IMPLANT

## 2017-03-18 NOTE — Progress Notes (Signed)
Pt stable in PACU No hematoma + bruit in fistula Chest xray catheter in good position  Ruta Hinds, MD [facility] [place] Office: [phone] Pager: [phone]

## 2017-03-18 NOTE — Anesthesia Preprocedure Evaluation (Addendum)
Anesthesia Evaluation  Patient identified by MRN, date of birth, ID band Patient awake    Reviewed: Allergy & Precautions, NPO status , Patient's Chart, lab work & pertinent test results, reviewed documented beta blocker date and time   Airway Mallampati: II  TM Distance: >3 FB Neck ROM: Full    Dental  (+) Dental Advisory Given, Partial Upper, Partial Lower   Pulmonary former smoker,    Pulmonary exam normal breath sounds clear to auscultation       Cardiovascular hypertension, Pt. on home beta blockers Normal cardiovascular exam+ Valvular Problems/Murmurs MR  Rhythm:Regular Rate:Normal  TTE 2018 - Ejection fraction was in the range of 65%   to 70%. Grade 1 diastolic dysfunction. Mild MR and TR.    Neuro/Psych negative neurological ROS  negative psych ROS   GI/Hepatic negative GI ROS, Neg liver ROS,   Endo/Other  Obesity  Renal/GU ESRFRenal disease  negative genitourinary   Musculoskeletal negative musculoskeletal ROS (+)   Abdominal (+) + obese,   Peds  Hematology  (+) anemia ,   Anesthesia Other Findings   Reproductive/Obstetrics                            Anesthesia Physical Anesthesia Plan  ASA: III  Anesthesia Plan: MAC   Post-op Pain Management:    Induction: Intravenous  PONV Risk Score and Plan: Propofol infusion and Treatment may vary due to age or medical condition  Airway Management Planned: Simple Face Mask  Additional Equipment: None  Intra-op Plan:   Post-operative Plan:   Informed Consent: I have reviewed the patients History and Physical, chart, labs and discussed the procedure including the risks, benefits and alternatives for the proposed anesthesia with the patient or authorized representative who has indicated his/her understanding and acceptance.   Dental advisory given  Plan Discussed with: CRNA  Anesthesia Plan Comments:         Anesthesia  Quick Evaluation

## 2017-03-18 NOTE — Discharge Instructions (Signed)
° °  [facility]  Discharge Instructions  AV Fistula or Graft Surgery for Dialysis Access  Please refer to the following instructions for your post-procedure care. Your surgeon or physician assistant will discuss any changes with you.  Activity  You may drive the day following your surgery, if you are comfortable and no longer taking prescription pain medication. Resume full activity as the soreness in your incision resolves.  Bathing/Showering  You may shower after you go home. Keep your incision dry for 48 hours. Do not soak in a bathtub, hot tub, or swim until the incision heals completely. You may not shower if you have a hemodialysis catheter.  Incision Care  Clean your incision with mild soap and water after 48 hours. Pat the area dry with a clean towel. You do not need a bandage unless otherwise instructed. Do not apply any ointments or creams to your incision. You may have skin glue on your incision. Do not peel it off. It will come off on its own in about one week. Your arm may swell a bit after surgery. To reduce swelling use pillows to elevate your arm so it is above your heart. Your doctor will tell you if you need to lightly wrap your arm with an ACE bandage.  Diet  Resume your normal diet. There are not special food restrictions following this procedure. In order to heal from your surgery, it is CRITICAL to get adequate nutrition. Your body requires vitamins, minerals, and protein. Vegetables are the best source of vitamins and minerals. Vegetables also provide the perfect balance of protein. Processed food has little nutritional value, so try to avoid this.  Medications  Resume taking all of your medications. If your incision is causing pain, you may take over-the counter pain relievers such as acetaminophen (Tylenol). If you were prescribed a stronger pain medication, please be aware these medications can cause nausea and constipation. Prevent  nausea by taking the medication with a snack or meal. Avoid constipation by drinking plenty of fluids and eating foods with high amount of fiber, such as fruits, vegetables, and grains. Do not take Tylenol if you are taking prescription pain medications.  Follow up Your surgeon may want to see you in the office following your access surgery. If so, this will be arranged at the time of your surgery.  Please call us immediately for any of the following conditions:  Increased pain, redness, drainage (pus) from your incision site Fever of 101 degrees or higher Severe or worsening pain at your incision site Hand pain or numbness.  Reduce your risk of vascular disease:  Stop smoking. If you would like help, call [facility] at 1-800-QUIT-NOW 979-225-7108) or [place] [facility] [place] your cholesterol Maintain a desired weight Control your diabetes Keep your blood pressure down  Dialysis  It will take several weeks to several months for your new dialysis access to be ready for use. Your surgeon will determine when it is OK to use it. Your nephrologist will continue to direct your dialysis. You can continue to use your Permcath until your new access is ready for use.   03/18/2017 Mary Chapman [id number] 1946-01-11  Surgeon(s): Fields, Jessy Oto, MD  Procedure(s): Brachiocephalic ARTERIOVENOUS (AV) FISTULA CREATION left arm INSERTION OF DIALYSIS CATHETER  x Do not stick fistula for 12 weeks    If you have any questions, please call the office at [phone].

## 2017-03-18 NOTE — H&P (View-Only) (Signed)
Hospital Consult    Reason for Consult:  [facility] and permanent HD access Requesting Physician:  Hollie Salk MRN #:  [id number]  History of Present Illness: This is a 71 y.o. female who presents with c/o shortness of breath and worsening of BLE swelling.  In March 2018, she was admitted to the hospital with anasarca and found to be related to collapsing focal segmental glomerular sclerosis after kidney biopsy and ATN.  She was treated with lasix and two dialysis sessions before return of her kidney function.  She is followed by Dr. Lorrene Reid.  The pt states she was placed on Prograf to help her kidney function and does not have a transplant.    The pt is on a statin for cholesterol management.  She is on a beta blocker for blood pressure management.  She does smoke 1/2 ppd of cigarettes.   Past Medical History:  Diagnosis Date  . Focal segmental glomerulosclerosis   . Hypertension   . Tobacco abuse     Past Surgical History:  Procedure Laterality Date  . IR GENERIC HISTORICAL  08/15/2016   IR US GUIDE VASC ACCESS RIGHT 08/15/2016 Arne Cleveland, MD [facility]  . IR GENERIC HISTORICAL  08/15/2016   IR FLUORO GUIDE CV LINE RIGHT 08/15/2016 Arne Cleveland, MD [facility]  . TUBAL LIGATION      No Known Allergies  Prior to Admission medications   Medication Sig Start Date End Date Taking? Authorizing Provider  metolazone (ZAROXOLYN) 10 MG tablet Take 10 mg by mouth 2 (two) times daily. 03/05/17  Yes [provider]  metoprolol tartrate (LOPRESSOR) 50 MG tablet Take 50 mg by mouth 2 (two) times daily. 03/05/17  Yes [provider]  multivitamin (RENA-VIT) TABS tablet Take 1 tablet by mouth at bedtime. 08/20/16  Yes Rama, Venetia Maxon, MD  potassium chloride SA (K-DUR,KLOR-CON) 20 MEQ tablet Take 2 tablets (40 mEq total) by mouth 2 (two) times daily. 08/20/16  Yes Rama, Venetia Maxon, MD  predniSONE (DELTASONE) 5 MG tablet Take 5 mg by mouth daily. 03/05/17  Yes [provider]    rosuvastatin (CRESTOR) 20 MG tablet Take 20 mg by mouth at bedtime.  09/11/16  Yes [provider]  tacrolimus (PROGRAF) 5 MG capsule Take 5 mg by mouth 2 (two) times daily. 02/06/17  Yes [provider]  torsemide (DEMADEX) 100 MG tablet Take 100 mg by mouth 2 (two) times daily. 02/27/17  Yes [provider]  Vitamin D, Ergocalciferol, (DRISDOL) 50000 units CAPS capsule Take 50,000 Units by mouth once a week. 01/16/17  Yes [provider]  furosemide (LASIX) 80 MG tablet Take 2 tablets (160 mg total) by mouth 3 (three) times daily. Patient not taking: Reported on 03/15/2017 08/20/16   Rama, Venetia Maxon, MD  nicotine (NICODERM CQ - DOSED IN MG/24 HOURS) 21 mg/24hr patch Place 1 patch (21 mg total) onto the skin daily. Patient not taking: Reported on 03/15/2017 08/20/16   Rama, Venetia Maxon, MD    Social History   Social History  . Marital status: Widowed    Spouse name: N/A  . Number of children: N/A  . Years of education: N/A   Occupational History  . Not on file.   Social History Main Topics  . Smoking status: Current Some Day Smoker    Packs/day: 0.50    Types: Cigarettes  . Smokeless tobacco: Never Used  . Alcohol use No  . Drug use: No  . Sexual activity: No   Other Topics  Concern  . Not on file   Social History Narrative  . No narrative on file     Family History  Problem Relation Age of Onset  . Hypertension Mother   . Diabetes Mother   . Hypertension Father   . Diabetes Father   . Hypertension Sister   . Hypertension Brother     ROS: [x]  Positive   [ ]  Negative   [ ]  All sytems reviewed and are negative  Cardiac: []  chest pain/pressure []  palpitations []  SOB lying flat []  DOE  Vascular: []  pain in legs while walking []  pain in legs at rest []  pain in legs at night []  non-healing ulcers []  hx of DVT [x]  swelling in legs  Pulmonary: []  productive cough []  asthma/wheezing []  home O2  Neurologic: []  weakness in []   arms []  legs []  numbness in []  arms []  legs []  hx of CVA []  mini stroke [] difficulty speaking or slurred speech []  temporary loss of vision in one eye []  dizziness  Hematologic: []  hx of cancer []  bleeding problems []  problems with blood clotting easily [x]  anemia of chronic disease  Endocrine:   []  diabetes []  thyroid disease  GI []  vomiting blood []  blood in stool  GU: [x]  CKD/renal failure []  HD--[]  M/W/F or []  T/T/S []  burning with urination []  blood in urine  Psychiatric: []  anxiety []  depression  Musculoskeletal: []  arthritis []  joint pain  Integumentary: []  rashes []  ulcers  Constitutional: []  fever []  chills   Physical Examination  Vitals:   03/17/17 0421 03/17/17 0900  BP: (!) 150/84 137/90  Pulse: 78 76  Resp: 15 18  Temp: 98 F (36.7 C) 98 F (36.7 C)  SpO2: 100% 100%   Body mass index is 33.34 kg/m.  General:  WDWN in NAD Gait: Not observed HENT: WNL, normocephalic Pulmonary: normal non-labored breathing Cardiac: regular Skin: without rashes Vascular Exam/Pulses: 2+ left radial pulse Extremities:  Swelling BLE Musculoskeletal: no muscle wasting or atrophy  Neurologic: A&O X 3;  No focal weakness or paresthesias are detected; speech is fluent/normal Psychiatric:  The pt has Normal affect.   CBC    Component Value Date/Time   WBC 8.2 03/17/2017 0730   RBC 3.25 (L) 03/17/2017 0730   HGB 10.5 (L) 03/17/2017 0730   HCT 31.8 (L) 03/17/2017 0730   PLT 265 03/17/2017 0730   MCV 97.8 03/17/2017 0730   MCH 32.3 03/17/2017 0730   MCHC 33.0 03/17/2017 0730   RDW 13.7 03/17/2017 0730   LYMPHSABS 2.8 03/17/2017 0730   MONOABS 1.0 03/17/2017 0730   EOSABS 0.2 03/17/2017 0730   BASOSABS 0.0 03/17/2017 0730    BMET    Component Value Date/Time   NA 140 03/17/2017 0730   K 3.9 03/17/2017 0730   CL 115 (H) 03/17/2017 0730   CO2 14 (L) 03/17/2017 0730   GLUCOSE 122 (H) 03/17/2017 0730   BUN 66 (H) 03/17/2017 0730   CREATININE  3.89 (H) 03/17/2017 0730   CALCIUM 7.5 (L) 03/17/2017 0730   GFRNONAA 11 (L) 03/17/2017 0730   GFRAA 13 (L) 03/17/2017 0730    COAGS: Lab Results  Component Value Date   INR 1.04 09/23/2016   INR 1.07 08/08/2016     Non-Invasive Vascular Imaging:   Vein mapping 03/17/17:  Adequate for left arm cephalic vein fistula  Statin:  Yes.   Beta Blocker:  Yes.   Aspirin:  No. ACEI:  No. ARB:  No. CCB use:  No Other antiplatelets/anticoagulants:  Yes.  SQ heparin   ASSESSMENT/PLAN: This is a 71 y.o. female with AKI on CKD 4 in need of dialysis access (permanent and TDC).   -vein mapping reveals left arm cephalic vein is adequate for fistula.  Restrict left arm. -will schedule for tomorrow afternoon, there is a chance that this will need to be rescheduled for later in the week.  Pt understands and wishes to proceed.  Npo/consent/labs. -hold SQ heparin after this evening's dose.   Leontine Locket, PA-C [facility] [phone]   I agree with the above.  I have seen and evaluated the patient.  She is scheduled to start dialysis.  We discussed placing a tunneled catheter.  I have also reviewed her vein mapping.  She appears to have an adequate left cephalic vein.  She is right-handed.  She has a palpable left brachial and radial pulse.I discussed proceeding with a left radiocephalic or brachiocephalic fistula.  I discussed the risks and benefits of the operation with the patient including the risk of non-maturity, the need for future interventions, as well as the risk of steal syndrome.  She understands this and is willing to proceed.  This was scheduled for tomorrow October 31.  Annamarie Major

## 2017-03-18 NOTE — Anesthesia Postprocedure Evaluation (Signed)
Anesthesia Post Note  Patient: Mary Chapman  Procedure(s) Performed: Brachiocephalic ARTERIOVENOUS (AV) FISTULA CREATION left arm (Left Arm Upper) INSERTION OF DIALYSIS CATHETER (N/A )     Patient location during evaluation: PACU Anesthesia Type: MAC Level of consciousness: awake and alert Pain management: pain level controlled Vital Signs Assessment: post-procedure vital signs reviewed and stable Respiratory status: spontaneous breathing, nonlabored ventilation and respiratory function stable Cardiovascular status: stable and blood pressure returned to baseline Anesthetic complications: no    Last Vitals:  Vitals:   03/18/17 1341 03/18/17 1345  BP: (!) 149/95   Pulse: 78   Resp: 12   Temp:  (!) 36.3 C  SpO2: 98%     Last Pain:  Vitals:   03/18/17 1345  TempSrc:   PainSc: 0-No pain                 Audry Pili

## 2017-03-18 NOTE — Op Note (Signed)
Procedure: US guided placement of right Palindrome catheter, Left Brachial Cephalic AV fistula  Preop: ESRD  Postop: ESRD  Anesthesia: Local with IV sedation  Assistant: Linus Orn SA  Findings: 3.5 mm cephalic vein, 23 cm Palindrome catheter right internal jugular vein  After obtaining informed consent, the patient was taken to the operating room. The patient was placed in supine position on the operating room table. After adequate sedation the patient's entire neck and chest were prepped and draped in usual sterile fashion. The patient was placed in Trendelenburg position. Ultrasound was used to identify the patient's right internal jugular vein. This had normal compressibility and respiratory variation. Local anesthesia was infiltrated over the right jugular vein.  Using ultrasound guidance, the right internal jugular vein was successfully cannulated.  A 0.035 J-tipped guidewire was threaded into the right internal jugular vein and into the superior vena cava followed by the inferior vena cava under fluoroscopic guidance.   Next sequential 12 and 14 dilators were placed over the guidewire into the right atrium.  A 16 French dilator with a peel-away sheath was then placed over the guidewire into the right atrium.   The guidewire and dilator were removed. A 23 cm Palindrome catheter was then placed through the peel away sheath into the right atrium.  The catheter was then tunneled subcutaneously, cut to length, and the hub attached. The catheter was noted to flush and draw easily. The catheter was inspected under fluoroscopy and found with its tip to be in the right atrium without any kinks throughout its course. The catheter was sutured to the skin with nylon sutures. The neck insertion site was closed with Vicryl stitch. The catheter was then loaded with concentrated Heparin solution. A dry sterile dressing was applied.  Next, the left upper extremity was prepped and draped in usual sterile  fashion.  A transverse incision was then made near the antecubital crease the left arm. The incision was carried into the subcutaneous tissues down to level of the cephalic vein. The cephalic vein was approximately 3.5 mm in diameter. It was of good quality. This was dissected free circumferentially and small side branches ligated and divided between silk ties or clips. Next the brachial artery was dissected free in the medial portion of the incision. The artery was  3-4 mm in diameter. The vessel loops were placed proximal and distal to the planned site of arteriotomy. The patient was given 3000 units of intravenous heparin. After appropriate circulation time, the vessel loops were used to control the artery. A longitudinal opening was made in the brachial artery.  The vein was ligated distally with a 2-0 silk tie. The vein was controlled proximally with a fine bulldog clamp. The vein was then swung over to the artery and sewn end of vein to side of artery using a running 7-0 Prolene suture. Just prior to completion of the anastomosis, everything was fore bled back bled and thoroughly flushed. The anastomosis was secured, vessel loops released, and there was a palpable thrill in the fistula immediately. After hemostasis was obtained, the subcutaneous tissues were reapproximated using a running 3-0 Vicryl suture. The skin was then closed with a 4 0 Vicryl subcuticular stitch. Dermabond was applied to the skin incision.    Ruta Hinds, MD [facility] [place] Office: [phone] Pager: [phone]

## 2017-03-18 NOTE — Anesthesia Procedure Notes (Addendum)
Procedure Name: MAC Date/Time: 03/18/2017 11:05 AM Performed by: Imagene Riches Pre-anesthesia Checklist: Patient identified, Emergency Drugs available, Suction available and Patient being monitored Patient Re-evaluated:Patient Re-evaluated prior to induction Oxygen Delivery Method: Simple face mask Preoxygenation: Pre-oxygenation with 100% oxygen Induction Type: IV induction Placement Confirmation: positive ETCO2 and breath sounds checked- equal and bilateral Dental Injury: Teeth and Oropharynx as per pre-operative assessment

## 2017-03-18 NOTE — Telephone Encounter (Signed)
Called by [facility] regarding pt complaining of coolness in left foot.  She has had prior fem fem bypass but has chronic history of several years of complex regional pain issues left foot.  She was last seen in our office about 4 months ago with ABI of 0.7 on the left.  She has a DP doppler signal.  Will schedule her to be seen in office tomorrow with ABIs  Ruta Hinds, MD [facility] [place]: [phone] Pager: [phone]

## 2017-03-18 NOTE — Transfer of Care (Signed)
Immediate Anesthesia Transfer of Care Note  Patient: Mary Chapman  Procedure(s) Performed: Brachiocephalic ARTERIOVENOUS (AV) FISTULA CREATION left arm (Left Arm Upper) INSERTION OF DIALYSIS CATHETER (N/A )  Patient Location: PACU  Anesthesia Type:MAC  Level of Consciousness: awake, alert  and oriented  Airway & Oxygen Therapy: Patient Spontanous Breathing and Patient connected to face mask oxygen  Post-op Assessment: Report given to RN and Post -op Vital signs reviewed and stable  Post vital signs: Reviewed and stable  Last Vitals:  Vitals:   03/18/17 0440 03/18/17 0945  BP: (!) 152/82 (!) 142/88  Pulse: 75 79  Resp: 17 18  Temp: 37.1 C 36.9 C  SpO2: 98% 98%    Last Pain:  Vitals:   03/18/17 0945  TempSrc: Oral  PainSc:          Complications: No apparent anesthesia complications

## 2017-03-18 NOTE — Progress Notes (Signed)
PROGRESS NOTE    Mary Chapman  OAC:[id number] DOB: Dec 31, 1945 DOA: 03/14/2017 PCP: Center, [facility]   Chief Complaint  Patient presents with  . Shortness of Breath    Brief Narrative:  HPI on 03/15/2017 by Dr. Fuller Plan Mary Chapman is a 71 y.o. female with medical history significant of HTN, tobacco abuse, and focal segmental glomerularsclerosis; who presents with complaints of shortness of breath on exertion and worsening lower extremity swelling. Back in 07/2016 the patient was admitted to the hospital with anasarca found to be related to collapsing focal segmental glomerular sclerosis after kidney biopsy and ATN. She was treated with high-dose Lasix and required two sessions of hemodialysis before return of her kidney function. Since that time she had been being followed by Dr.  Waynette Buttery of nephrology. Patient notes that her Torsemide dose was decreased from 80 mg TID down to 100 mg twice daily on October 18 due to with the patient reported as decreased kidney function. Since her change in dosage patient notes being significantly short of breath with any kind of activity. Associated symptoms include Colace, wheezing, nausea, and loose watery stools.  Her and her nephrologist had recently discussed her likely need of being placed on hemodialysis for which she was scheduled to have an appointment with vascular surgery on November 1 for vascular access.  Interim history Nephrology consulted and are aggressively diuresing the patient. Patient's Labs not improving much so patient will need Dialysis Access [facility] and permanent HD Access) so Vascular Surgery consulted. Patient underwent vein mapping and patient was found to have Superficial Vein Thrombus throughout the Right Cephalic Vein so Left Arm Cephalic Vein will be used for fistula. Plan is for Dialysis after [facility] and Fistula placement.  Assessment & Plan   Acute Kidney Injury on Chronic kidney disease, Stage IV -In the setting of FSGS    -Nephrology consulted and appreciated, planning for further dialysis -Vascular surgery consulted and appreciated, s/p access placement today -Continue IV lasix, metolazone, tacrolimus, prednisone  -Continue to monitor intake/output -Hepatitis B/C negative -UA showed Cloudy Urine, Moderate Hgb, >300 Protein, Few Bacteria, Granular and Hyaline Casts  Dyspnea secondary to hypervolemia -CXR negative for infection -Continue IV diuresis and volume control with HD -Continue neb treatments   Anasarca -Treatment and plan as above   Hyperkalemia -resolved, continue to monitor BMP  Elevated Troponin -Likely due to renal failure -Currently chest pain free  Macrocytic Anemia and Anemia of Chronic Kidney Disease -Anemia Panel showed Iron of 92, UIBC of 83, TIBC of 175, Saturations of 53 -hemoglobin currently 9 -Vitamin B12: 272, Folate 6.5 -Will give Vitamin B12 replacement  Essential Hypertension -Continue metoprolol and PRN hydralazine -continues to be on diuretics   Hyperlipidemia -lipid panel: TC 219, HDL 63, LDL 110, TG 229 -Continue statin   History of Tobacco Abuse -Patient reports quitting smoking at some point following her last hospitalization.  Hyperphosphatemia -phos 9.7, continue to monitor, hopefully will correct with hemodialysis   DVT Prophylaxis  heparin  Code Status: Full  Family Communication: None at bedside  Disposition Plan: admitted, pending access placement/HD today  Consultants Nephrology Vascular surgery   Procedures  Vein Mapping US guided placement of Right Palindrome catheter, Left brachial cephalic AV fistula  Antibiotics   Anti-infectives    Start     Dose/Rate Route Frequency Ordered Stop   03/18/17 0600  ceFAZolin (ANCEF) IVPB 1 g/50 mL premix    Comments:  Send with pt to OR   1 g 100 mL/hr over  30 Minutes Intravenous To Surgery 03/17/17 1436 03/18/17 1110      Subjective:   Mary Chapman seen and examined today.   Feels tired after surgery. Denies chest pain, shortness of breath, abdominal pain, nausea, vomiting, diarrhea, constipation, headache, dizziness.  Objective:   Vitals:   03/18/17 1326 03/18/17 1330 03/18/17 1341 03/18/17 1345  BP: (!) 130/97  (!) 149/95   Pulse: 83 81 78   Resp: 14 13 12    Temp:    (!) 97.3 F (36.3 C)  TempSrc:      SpO2: 99% 98% 98%   Weight:      Height:        Intake/Output Summary (Last 24 hours) at 03/18/17 1419 Last data filed at 03/18/17 1346  Gross per 24 hour  Intake              684 ml  Output              470 ml  Net              214 ml   Filed Weights   03/17/17 2055 03/18/17 0219 03/18/17 0927  Weight: 94.8 kg (209 lb) 94.8 kg (208 lb 15.9 oz) 94.3 kg (208 lb)    Exam  General: Well developed, well nourished, NAD, appears stated age  74: NCAT,mucous membranes moist.   Cardiovascular: S1 S2 auscultated, 2/6 SEM, RRR  Respiratory: diminished breath sounds  Abdomen: Soft, nontender, nondistended, + bowel sounds  Extremities: warm dry without cyanosis clubbing. 2-3+LE edema   Neuro: AAOx3, nonfocal  Psych: Normal affect and demeanor with intact judgement and insight   Data Reviewed: I have personally reviewed following labs and imaging studies  CBC:  Recent Labs Lab 03/14/17 2200 03/15/17 0513 03/16/17 1025 03/17/17 0730 03/18/17 0308  WBC 10.3 8.8 6.7 8.2 6.7  NEUTROABS 8.1*  --  4.2 4.3 3.6  HGB 10.0* 9.9* 9.3* 10.5* 9.0*  HCT 31.0* 29.6* 28.6* 31.8* 26.7*  MCV 101.3* 99.0 99.3 97.8 95.7  PLT 279 292 247 265 297   Basic Metabolic Panel:  Recent Labs Lab 03/14/17 2200 03/15/17 0513 03/16/17 1025 03/17/17 0730 03/18/17 0308  NA 135 141 140 140 140  K 6.7* 7.0* 4.2 3.9 3.7  CL 117* 119* 117* 115* 115*  CO2 14* 14* 15* 14* 15*  GLUCOSE 99 102* 102* 122* 94  BUN 79* 81* 68* 66* 68*  CREATININE 4.31* 4.08* 3.97* 3.89* 3.84*  CALCIUM 8.0* 8.3* 7.7* 7.5* 7.1*  MG  --   --  1.9 1.8 1.7  PHOS  --   --  10.2*  10.2* 9.7*   GFR: Estimated Creatinine Clearance: 15.8 mL/min (A) (by C-G formula based on SCr of 3.84 mg/dL (H)). Liver Function Tests:  Recent Labs Lab 03/14/17 2200 03/16/17 1025 03/17/17 0730 03/18/17 0308  AST 19 15 18 15   ALT 11* 12* 12* 10*  ALKPHOS 58 54 57 48  BILITOT 0.2* 0.2* 0.6 0.4  PROT 4.7* 4.0* 4.5* 3.9*  ALBUMIN 1.7* 1.4*  1.4* 1.6* 1.4*   No results for input(s): LIPASE, AMYLASE in the last 168 hours. No results for input(s): AMMONIA in the last 168 hours. Coagulation Profile:  Recent Labs Lab 03/18/17 0308  INR 0.97   Cardiac Enzymes:  Recent Labs Lab 03/14/17 2200 03/15/17 0513 03/15/17 1150  TROPONINI 0.03* <0.03 <0.03   BNP (last 3 results) No results for input(s): PROBNP in the last 8760 hours. HbA1C: No results for input(s): HGBA1C in the last  72 hours. CBG:  Recent Labs Lab 03/15/17 0832  GLUCAP 88   Lipid Profile:  Recent Labs  03/18/17 0309  CHOL 219*  HDL 63  LDLCALC 110*  TRIG 229*  CHOLHDL 3.5   Thyroid Function Tests: No results for input(s): TSH, T4TOTAL, FREET4, T3FREE, THYROIDAB in the last 72 hours. Anemia Panel: No results for input(s): VITAMINB12, FOLATE, FERRITIN, TIBC, IRON, RETICCTPCT in the last 72 hours. Urine analysis:    Component Value Date/Time   COLORURINE YELLOW 03/14/2017 2330   APPEARANCEUR CLOUDY (A) 03/14/2017 2330   LABSPEC 1.020 03/14/2017 2330   PHURINE 6.0 03/14/2017 2330   GLUCOSEU 100 (A) 03/14/2017 2330   HGBUR MODERATE (A) 03/14/2017 2330   BILIRUBINUR NEGATIVE 03/14/2017 2330   KETONESUR NEGATIVE 03/14/2017 2330   PROTEINUR >300 (A) 03/14/2017 2330   NITRITE NEGATIVE 03/14/2017 2330   LEUKOCYTESUR NEGATIVE 03/14/2017 2330   Sepsis Labs: @LABRCNTIP (procalcitonin:4,lacticidven:4)  ) Recent Results (from the past 240 hour(s))  Surgical pcr screen     Status: None   Collection Time: 03/17/17  9:58 PM  Result Value Ref Range Status   MRSA, PCR NEGATIVE NEGATIVE Final    Staphylococcus aureus NEGATIVE NEGATIVE Final    Comment: (NOTE) The Xpert SA Assay (FDA approved for NASAL specimens in patients 32 years of age and older), is one component of a comprehensive surveillance program. It is not intended to diagnose infection nor to guide or monitor treatment.       Radiology Studies: Dg Chest Port 1 View  Result Date: 03/18/2017 CLINICAL DATA:  Central line placement EXAM: PORTABLE CHEST 1 VIEW COMPARISON:  03/17/2017 FINDINGS: Right internal jugular venous catheter has its tip in the lower right atrium. No pneumothorax. The lungs are clear. No edema. IMPRESSION: Central line tip in the lower right atrium.  No pneumothorax. Electronically Signed   By: Nelson Chimes M.D.   On: 03/18/2017 13:34   Dg Chest Port 1 View  Result Date: 03/17/2017 CLINICAL DATA:  71 year old female with shortness of Breath on exertion. Anasarca, glomerulosclerosis, acute on chronic renal failure. EXAM: PORTABLE CHEST 1 VIEW COMPARISON:  03/14/2017 and earlier. FINDINGS: Portable AP semi upright view at 0630 hours. Stable mild cardiomegaly and mediastinal contours. Mildly lower lung volumes. No pneumothorax. Stable pulmonary vascular congestion without overt edema. Possible trace fluid along the right minor fissure, versus mild atelectasis. Small if any layering pleural effusions. No other confluent opacity. Visualized tracheal air column is within normal limits. IMPRESSION: Mildly lower lung volumes. Stable vascular congestion without overt edema. Possible small effusions. Electronically Signed   By: Genevie Ann M.D.   On: 03/17/2017 08:52   Dg Fluoro Guide Cv Line-no Report  Result Date: 03/18/2017 Fluoroscopy was utilized by the requesting physician.  No radiographic interpretation.     Scheduled Meds: . atorvastatin  20 mg Oral q1800  . [START ON 03/19/2017] heparin subcutaneous  5,000 Units Subcutaneous Q8H  . metolazone  10 mg Oral BID  . metoprolol tartrate  50 mg Oral BID  .  predniSONE  5 mg Oral Q breakfast  . sodium bicarbonate  1,950 mg Oral TID  . tacrolimus  3 mg Oral BID   Continuous Infusions: . sodium chloride    . sodium chloride    . sodium chloride 10 mL/hr at 03/18/17 0959  . furosemide Stopped (03/18/17 0945)     LOS: 3 days   Time Spent in minutes   30 minutes  Mary Chapman D.O. on 03/18/2017 at 2:19 PM  Between  7am to 7pm - Pager - [phone]  After 7pm go to [email or web address] - password [email or web address]  And look for the night coverage person covering for me after hours  [facility] Office  [phone]

## 2017-03-18 NOTE — Progress Notes (Signed)
[facility] Progress Note    Assessment/ Plan:   1. AKI on CKD 4 from FSGS- followed by Dr Lorrene Reid.  Continue prednisone 5 mg and prograf 3 mg BID for now, will need to taper down.  TDC and perm access with VVS today, followed by HD #1.  CLIP initiated     2. Hypertension: improving; on lopressor and high-dose Lasix with metolazone, expect to improve with UF  3. Anemia of ESRD: Hgb 10.5, will initiate ESA when appropriate  4. Metabolic Bone Disease: PTH 61  Subjective:    For [facility] and fistula today and HD #1   Objective:   BP (!) 142/88 (BP Location: Right Wrist)   Pulse 79   Temp 98.5 F (36.9 C) (Oral)   Resp 18   Ht 5\' 6"  (1.676 m)   Wt 94.3 kg (208 lb)   SpO2 98%   BMI 33.57 kg/m   Intake/Output Summary (Last 24 hours) at 03/18/17 1258 Last data filed at 03/18/17 1249  Gross per 24 hour  Intake              904 ml  Output              470 ml  Net              434 ml   Weight change: 1.089 kg (2 lb 6.4 oz)  Physical Exam: GEN NAD, lying in bed HEENT sclerae anicteric NECK no JVD PULM normal WOB, bibasilar crackles CV RRR systolic murmur ABD + mildly distended EXT 3+ LE edema NEURO AAO x 3  Imaging: Dg Chest Port 1 View  Result Date: 03/17/2017 CLINICAL DATA:  71 year old female with shortness of Breath on exertion. Anasarca, glomerulosclerosis, acute on chronic renal failure. EXAM: PORTABLE CHEST 1 VIEW COMPARISON:  03/14/2017 and earlier. FINDINGS: Portable AP semi upright view at 0630 hours. Stable mild cardiomegaly and mediastinal contours. Mildly lower lung volumes. No pneumothorax. Stable pulmonary vascular congestion without overt edema. Possible trace fluid along the right minor fissure, versus mild atelectasis. Small if any layering pleural effusions. No other confluent opacity. Visualized tracheal air column is within normal limits. IMPRESSION: Mildly lower lung volumes. Stable vascular congestion without overt edema. Possible small  effusions. Electronically Signed   By: Genevie Ann M.D.   On: 03/17/2017 08:52   Dg Fluoro Guide Cv Line-no Report  Result Date: 03/18/2017 Fluoroscopy was utilized by the requesting physician.  No radiographic interpretation.    Labs: BMET  Recent Labs Lab 03/14/17 2200 03/15/17 0513 03/16/17 1025 03/17/17 0730 03/18/17 0308  NA 135 141 140 140 140  K 6.7* 7.0* 4.2 3.9 3.7  CL 117* 119* 117* 115* 115*  CO2 14* 14* 15* 14* 15*  GLUCOSE 99 102* 102* 122* 94  BUN 79* 81* 68* 66* 68*  CREATININE 4.31* 4.08* 3.97* 3.89* 3.84*  CALCIUM 8.0* 8.3* 7.7* 7.5* 7.1*  PHOS  --   --  10.2* 10.2* 9.7*   CBC  Recent Labs Lab 03/14/17 2200 03/15/17 0513 03/16/17 1025 03/17/17 0730 03/18/17 0308  WBC 10.3 8.8 6.7 8.2 6.7  NEUTROABS 8.1*  --  4.2 4.3 3.6  HGB 10.0* 9.9* 9.3* 10.5* 9.0*  HCT 31.0* 29.6* 28.6* 31.8* 26.7*  MCV 101.3* 99.0 99.3 97.8 95.7  PLT 279 292 247 265 230    Medications:    . [MAR Hold] atorvastatin  20 mg Oral q1800  . [MAR Hold] heparin subcutaneous  5,000 Units Subcutaneous Q8H  . [facility] Hold]  metolazone  10 mg Oral BID  . [MAR Hold] metoprolol tartrate  50 mg Oral BID  . [MAR Hold] predniSONE  5 mg Oral Q breakfast  . [MAR Hold] sodium bicarbonate  1,950 mg Oral TID  . [MAR Hold] tacrolimus  3 mg Oral BID      Madelon Lips MD [facility] pgr [phone] 03/18/2017, 12:58 PM

## 2017-03-18 NOTE — Interval H&P Note (Signed)
History and Physical Interval Note:  03/18/2017 10:45 AM  Mary Chapman  has presented today for surgery, with the diagnosis of end stage renal disease  The various methods of treatment have been discussed with the patient and family. After consideration of risks, benefits and other options for treatment, the patient has consented to  Procedure(s): ARTERIOVENOUS (AV) FISTULA CREATION (Left) INSERTION OF DIALYSIS CATHETER (N/A) as a surgical intervention .  The patient's history has been reviewed, patient examined, no change in status, stable for surgery.  I have reviewed the patient's chart and labs.  Questions were answered to the patient's satisfaction.     Ruta Hinds

## 2017-03-19 ENCOUNTER — Other Ambulatory Visit (HOSPITAL_COMMUNITY): Payer: Medicare Other

## 2017-03-19 ENCOUNTER — Encounter (HOSPITAL_COMMUNITY): Payer: Medicare Other

## 2017-03-19 ENCOUNTER — Encounter (HOSPITAL_COMMUNITY): Payer: Self-pay | Admitting: Vascular Surgery

## 2017-03-19 DIAGNOSIS — Z452 Encounter for adjustment and management of vascular access device: Secondary | ICD-10-CM

## 2017-03-19 LAB — CBC
HCT: 27 % — ABNORMAL LOW (ref 36.0–46.0)
HEMATOCRIT: 26 % — AB (ref 36.0–46.0)
HEMOGLOBIN: 8.9 g/dL — AB (ref 12.0–15.0)
Hemoglobin: 9.1 g/dL — ABNORMAL LOW (ref 12.0–15.0)
MCH: 32 pg (ref 26.0–34.0)
MCH: 32.4 pg (ref 26.0–34.0)
MCHC: 33.7 g/dL (ref 30.0–36.0)
MCHC: 34.2 g/dL (ref 30.0–36.0)
MCV: 95.1 fL (ref 78.0–100.0)
MCV: 94.5 fL (ref 78.0–100.0)
PLATELETS: 237 10*3/uL (ref 150–400)
Platelets: 215 10*3/uL (ref 150–400)
RBC: 2.84 MIL/uL — AB (ref 3.87–5.11)
RBC: 2.75 MIL/uL — ABNORMAL LOW (ref 3.87–5.11)
RDW: 13.4 % (ref 11.5–15.5)
RDW: 13.5 % (ref 11.5–15.5)
WBC: 10.1 10*3/uL (ref 4.0–10.5)
WBC: 11 10*3/uL — AB (ref 4.0–10.5)

## 2017-03-19 LAB — RENAL FUNCTION PANEL
ALBUMIN: 1.4 g/dL — AB (ref 3.5–5.0)
ANION GAP: 10 (ref 5–15)
BUN: 51 mg/dL — AB (ref 6–20)
CHLORIDE: 109 mmol/L (ref 101–111)
CO2: 20 mmol/L — ABNORMAL LOW (ref 22–32)
Calcium: 6.9 mg/dL — ABNORMAL LOW (ref 8.9–10.3)
Creatinine, Ser: 3.48 mg/dL — ABNORMAL HIGH (ref 0.44–1.00)
GFR calc Af Amer: 14 mL/min — ABNORMAL LOW (ref >60)
GFR, EST NON AFRICAN AMERICAN: 12 mL/min — AB (ref >60)
Glucose, Bld: 132 mg/dL — ABNORMAL HIGH (ref 65–99)
PHOSPHORUS: 7.1 mg/dL — AB (ref 2.5–4.6)
POTASSIUM: 3.4 mmol/L — AB (ref 3.5–5.1)
Sodium: 139 mmol/L (ref 135–145)

## 2017-03-19 MED ORDER — CYANOCOBALAMIN 1000 MCG/ML IJ SOLN
1000.0000 ug | Freq: Once | INTRAMUSCULAR | Status: AC
  Administered 2017-03-19: 1000 ug via INTRAMUSCULAR
  Filled 2017-03-19: qty 1

## 2017-03-19 NOTE — Progress Notes (Signed)
PROGRESS NOTE    Mary Chapman  SNK:[id number] DOB: 1945/06/23 DOA: 03/14/2017 PCP: [facility], [facility]   Chief Complaint  Patient presents with  . Shortness of Breath    Brief Narrative:  HPI on 03/15/2017 by Dr. Fuller Plan [name] is a 71 y.o. female with medical history significant of HTN, tobacco abuse, and focal segmental glomerularsclerosis; who presents with complaints of shortness of breath on exertion and worsening lower extremity swelling. Back in 07/2016 the patient was admitted to the hospital with anasarca found to be related to collapsing focal segmental glomerular sclerosis after kidney biopsy and ATN. She was treated with high-dose Lasix and required two sessions of hemodialysis before return of her kidney function. Since that time she had been being followed by Dr.  Waynette Buttery of nephrology. Patient notes that her Torsemide dose was decreased from 80 mg TID down to 100 mg twice daily on October 18 due to with the patient reported as decreased kidney function. Since her change in dosage patient notes being significantly short of breath with any kind of activity. Associated symptoms include Colace, wheezing, nausea, and loose watery stools.  Her and her nephrologist had recently discussed her likely need of being placed on hemodialysis for which she was scheduled to have an appointment with vascular surgery on November 1 for vascular access.  Interim history Nephrology consulted and are aggressively diuresing the patient. Patient's Labs not improving much so patient will need Dialysis Access [facility] and permanent HD Access) so Vascular Surgery consulted. Patient underwent vein mapping and patient was found to have Superficial Vein Thrombus throughout the Right Cephalic Vein so Left Arm Cephalic Vein will be used for fistula. S/p TDC and Fistula placement. Started HD on 11/1 Assessment & Plan   Acute Kidney Injury on Chronic kidney disease, Stage IV -In the setting of FSGS    -Nephrology consulted and appreciated -Vascular surgery consulted and appreciated, s/p access placement  -Continue IV lasix, metolazone, tacrolimus, prednisone  -Continue to monitor intake/output -Hepatitis B/C negative -UA showed Cloudy Urine, Moderate Hgb, >300 Protein, Few Bacteria, Granular and Hyaline Casts -continue hemodialysis, clipping process started  Dyspnea secondary to hypervolemia -CXR negative for infection -Continue IV diuresis and volume control with HD -Continue neb treatments   Anasarca -Treatment and plan as above   Hyperkalemia -resolved, continue to monitor BMP  Elevated Troponin -Likely due to renal failure -Currently chest pain free  Macrocytic Anemia and Anemia of Chronic Kidney Disease -Anemia Panel showed Iron of 92, UIBC of 83, TIBC of 175, Saturations of 53 -hemoglobin currently 9.1 -Vitamin B12: 272, Folate 6.5 -Vitamin B12 replacement given  Essential Hypertension -Continue metoprolol and PRN hydralazine -continues to be on diuretics   Hyperlipidemia -lipid panel: TC 219, HDL 63, LDL 110, TG 229 -Continue statin   History of Tobacco Abuse -Patient reports quitting smoking at some point following her last hospitalization.  Hyperphosphatemia -last phos 9.7, continue to monitor, hopefully will correct with hemodialysis  -pending labs today  DVT Prophylaxis  heparin  Code Status: Full  Family Communication: None at bedside  Disposition Plan: admitted, pending access placement/HD today  Consultants Nephrology Vascular surgery   Procedures  Vein Mapping US guided placement of Right Palindrome catheter, Left brachial cephalic AV fistula  Antibiotics   Anti-infectives    Start     Dose/Rate Route Frequency Ordered Stop   03/18/17 0600  ceFAZolin (ANCEF) IVPB 1 g/50 mL premix    Comments:  Send with pt to OR   1 g  100 mL/hr over 30 Minutes Intravenous To Surgery 03/17/17 1436 03/18/17 1110      Subjective:    [name] seen and examined today.  Has no complaints today, was able to tolerate dialysis earlier today (3am). Denies chest pain, shortness of breath, abdominal pain, nausea, vomiting, diarrhea, constipation, headache, dizziness.  Objective:   Vitals:   03/19/17 0500 03/19/17 0530 03/19/17 0545 03/19/17 0920  BP: 127/69 (!) 143/82 (!) 146/78 (!) 143/91  Pulse: 69 69 68 86  Resp:   18 20  Temp:   98.5 F (36.9 C) 98.9 F (37.2 C)  TempSrc:   Oral Oral  SpO2:   99% 100%  Weight:   94.8 kg (208 lb 15.9 oz)   Height:        Intake/Output Summary (Last 24 hours) at 03/19/17 1240 Last data filed at 03/19/17 1108  Gross per 24 hour  Intake              900 ml  Output             1320 ml  Net             -420 ml   Filed Weights   03/18/17 2200 03/19/17 0300 03/19/17 0545  Weight: 95.7 kg (211 lb) 96.1 kg (211 lb 13.8 oz) 94.8 kg (208 lb 15.9 oz)   Exam  General: Well developed, well nourished, NAD, appears stated age  HEENT: NCAT, mucous membranes moist.   Cardiovascular: S1 S2 auscultated, 2/6SEM, RRR  Respiratory: Clear to auscultation bilaterally with equal chest rise  Abdomen: Soft, nontender, nondistended, + bowel sounds  Extremities: warm dry without cyanosis clubbing. 2-3+ LE edema  Neuro: AAOx3, nonfocal  Psych: Normal affect and demeanor with intact judgement and insight, pleasant  Data Reviewed: I have personally reviewed following labs and imaging studies  CBC:  Recent Labs Lab 03/14/17 2200 03/15/17 0513 03/16/17 1025 03/17/17 0730 03/18/17 0308 03/19/17 0327  WBC 10.3 8.8 6.7 8.2 6.7 10.1  NEUTROABS 8.1*  --  4.2 4.3 3.6  --   HGB 10.0* 9.9* 9.3* 10.5* 9.0* 9.1*  HCT 31.0* 29.6* 28.6* 31.8* 26.7* 27.0*  MCV 101.3* 99.0 99.3 97.8 95.7 95.1  PLT 279 292 247 265 230 299   Basic Metabolic Panel:  Recent Labs Lab 03/14/17 2200 03/15/17 0513 03/16/17 1025 03/17/17 0730 03/18/17 0308  NA 135 141 140 140 140  K 6.7* 7.0* 4.2 3.9 3.7  CL  117* 119* 117* 115* 115*  CO2 14* 14* 15* 14* 15*  GLUCOSE 99 102* 102* 122* 94  BUN 79* 81* 68* 66* 68*  CREATININE 4.31* 4.08* 3.97* 3.89* 3.84*  CALCIUM 8.0* 8.3* 7.7* 7.5* 7.1*  MG  --   --  1.9 1.8 1.7  PHOS  --   --  10.2* 10.2* 9.7*   GFR: Estimated Creatinine Clearance: 15.8 mL/min (A) (by C-G formula based on SCr of 3.84 mg/dL (H)). Liver Function Tests:  Recent Labs Lab 03/14/17 2200 03/16/17 1025 03/17/17 0730 03/18/17 0308  AST 19 15 18 15   ALT 11* 12* 12* 10*  ALKPHOS 58 54 57 48  BILITOT 0.2* 0.2* 0.6 0.4  PROT 4.7* 4.0* 4.5* 3.9*  ALBUMIN 1.7* 1.4*  1.4* 1.6* 1.4*   No results for input(s): LIPASE, AMYLASE in the last 168 hours. No results for input(s): AMMONIA in the last 168 hours. Coagulation Profile:  Recent Labs Lab 03/18/17 0308  INR 0.97   Cardiac Enzymes:  Recent Labs Lab 03/14/17 2200 03/15/17  3300 03/15/17 1150  TROPONINI 0.03* <0.03 <0.03   BNP (last 3 results) No results for input(s): PROBNP in the last 8760 hours. HbA1C: No results for input(s): HGBA1C in the last 72 hours. CBG:  Recent Labs Lab 03/15/17 0832  GLUCAP 88   Lipid Profile:  Recent Labs  03/18/17 0309  CHOL 219*  HDL 63  LDLCALC 110*  TRIG 229*  CHOLHDL 3.5   Thyroid Function Tests: No results for input(s): TSH, T4TOTAL, FREET4, T3FREE, THYROIDAB in the last 72 hours. Anemia Panel: No results for input(s): VITAMINB12, FOLATE, FERRITIN, TIBC, IRON, RETICCTPCT in the last 72 hours. Urine analysis:    Component Value Date/Time   COLORURINE YELLOW 03/14/2017 2330   APPEARANCEUR CLOUDY (A) 03/14/2017 2330   LABSPEC 1.020 03/14/2017 2330   PHURINE 6.0 03/14/2017 2330   GLUCOSEU 100 (A) 03/14/2017 2330   HGBUR MODERATE (A) 03/14/2017 2330   BILIRUBINUR NEGATIVE 03/14/2017 2330   KETONESUR NEGATIVE 03/14/2017 2330   PROTEINUR >300 (A) 03/14/2017 2330   NITRITE NEGATIVE 03/14/2017 2330   LEUKOCYTESUR NEGATIVE 03/14/2017 2330   Sepsis  Labs: @LABRCNTIP (procalcitonin:4,lacticidven:4)  ) Recent Results (from the past 240 hour(s))  Surgical pcr screen     Status: None   Collection Time: 03/17/17  9:58 PM  Result Value Ref Range Status   MRSA, PCR NEGATIVE NEGATIVE Final   Staphylococcus aureus NEGATIVE NEGATIVE Final    Comment: (NOTE) The Xpert SA Assay (FDA approved for NASAL specimens in patients 23 years of age and older), is one component of a comprehensive surveillance program. It is not intended to diagnose infection nor to guide or monitor treatment.       Radiology Studies: Dg Chest Port 1 View  Result Date: 03/18/2017 CLINICAL DATA:  Central line placement EXAM: PORTABLE CHEST 1 VIEW COMPARISON:  03/17/2017 FINDINGS: Right internal jugular venous catheter has its tip in the lower right atrium. No pneumothorax. The lungs are clear. No edema. IMPRESSION: Central line tip in the lower right atrium.  No pneumothorax. Electronically Signed   By: Nelson Chimes M.D.   On: 03/18/2017 13:34   Dg Fluoro Guide Cv Line-no Report  Result Date: 03/18/2017 Fluoroscopy was utilized by the requesting physician.  No radiographic interpretation.     Scheduled Meds: . atorvastatin  20 mg Oral q1800  . heparin subcutaneous  5,000 Units Subcutaneous Q8H  . metolazone  10 mg Oral BID  . metoprolol tartrate  50 mg Oral BID  . predniSONE  5 mg Oral Q breakfast  . sodium bicarbonate  1,950 mg Oral TID  . tacrolimus  3 mg Oral BID   Continuous Infusions: . sodium chloride    . sodium chloride    . sodium chloride 10 mL/hr at 03/18/17 0959  . furosemide Stopped (03/19/17 0911)     LOS: 4 days   Time Spent in minutes   30 minutes  Janyah Singleterry D.O. on 03/19/2017 at 12:40 PM  Between 7am to 7pm - Pager - [phone]  After 7pm go to [email or web address] - password [email or web address]  And look for the night coverage person covering for me after hours  [facility] Office  [phone]

## 2017-03-19 NOTE — Progress Notes (Addendum)
[name]  Subjective  - doing well without new complaints.   Objective (!) 146/78 68 98.5 F (36.9 C) (Oral) 18 99%  Intake/Output Summary (Last 24 hours) at 03/19/17 0925 Last data filed at 03/19/17 0545  Gross per 24 hour  Intake              660 ml  Output             1320 ml  Net             -660 ml    Left anti cubital incision minimal edema and ecchymosis.  Palpable radial pulse. Right tunneled catheter functioning fine s/p HD this am.  Assessment/Planning: POD # 1 ight Palindrome catheter, Left Brachial Cephalic AV fistula  We will schedule her for a follow up appointment in 6-7 weeks with duplex study of the left av fistula.  Do not stick fistula for at least 12 weeks.  Laurence Slate [facility] 03/19/2017 9:25 AM --  Agree with above.  Will sign off call if questions  Ruta Hinds, MD [facility] [place] Office: [phone] Pager: [phone]  Laboratory Lab Results:  Recent Labs  03/18/17 0308 03/19/17 0327  WBC 6.7 10.1  HGB 9.0* 9.1*  HCT 26.7* 27.0*  PLT 230 237   BMET  Recent Labs  03/17/17 0730 03/18/17 0308  NA 140 140  K 3.9 3.7  CL 115* 115*  CO2 14* 15*  GLUCOSE 122* 94  BUN 66* 68*  CREATININE 3.89* 3.84*  CALCIUM 7.5* 7.1*    COAG Lab Results  Component Value Date   INR 0.97 03/18/2017   INR 1.04 09/23/2016   INR 1.07 08/08/2016   No results found for: PTT

## 2017-03-19 NOTE — Progress Notes (Signed)
[facility] Progress Note    Assessment/ Plan:   1. 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  Continue prednisone 5 mg and prograf 3 mg BID for now, will need to taper down eventually.  S/p TDC and AVF, tolerated dialysis.  CLIP in process.  2. Hypertension: improving; on lopressor and high-dose Lasix with metolazone, expect to improve with UF  3. Anemia of ESRD: Hgb 10.5, will initiate ESA when appropriate  4. Metabolic Bone Disease: PTH 61  Subjective:    Tolerated HD #1 well yesterday.  For HD #2 today.  Objective:   BP (!) 143/91 (BP Location: Left Arm)   Pulse 86   Temp 98.9 F (37.2 C) (Oral)   Resp 20   Ht 5\' 6"  (1.676 m)   Wt 94.8 kg (208 lb 15.9 oz)   SpO2 100%   BMI 33.73 kg/m   Intake/Output Summary (Last 24 hours) at 03/19/17 1244 Last data filed at 03/19/17 1108  Gross per 24 hour  Intake              900 ml  Output             1320 ml  Net             -420 ml   Weight change: -0.454 kg (-1 lb)  Physical Exam: GEN NAD, lying in bed, awake and alert HEENT sclerae anicteric NECK no JVD PULM normal WOB muffled at bases CV RRR systolic murmur ABD + mildly distended EXT 3+ LE edema, slightly improved NEURO AAO x 3 ACCESS: LUE AVF +T/B  Imaging: Dg Chest Port 1 View  Result Date: 03/18/2017 CLINICAL DATA:  Central line placement EXAM: PORTABLE CHEST 1 VIEW COMPARISON:  03/17/2017 FINDINGS: Right internal jugular venous catheter has its tip in the lower right atrium. No pneumothorax. The lungs are clear. No edema. IMPRESSION: Central line tip in the lower right atrium.  No pneumothorax. Electronically Signed   By: Nelson Chimes M.D.   On: 03/18/2017 13:34   Dg Fluoro Guide Cv Line-no Report  Result Date: 03/18/2017 Fluoroscopy was utilized by the requesting physician.  No radiographic interpretation.    Labs: BMET  Recent Labs Lab 03/14/17 2200 03/15/17 0513 03/16/17 1025 03/17/17 0730 03/18/17 0308  NA 135  141 140 140 140  K 6.7* 7.0* 4.2 3.9 3.7  CL 117* 119* 117* 115* 115*  CO2 14* 14* 15* 14* 15*  GLUCOSE 99 102* 102* 122* 94  BUN 79* 81* 68* 66* 68*  CREATININE 4.31* 4.08* 3.97* 3.89* 3.84*  CALCIUM 8.0* 8.3* 7.7* 7.5* 7.1*  PHOS  --   --  10.2* 10.2* 9.7*   CBC  Recent Labs Lab 03/14/17 2200  03/16/17 1025 03/17/17 0730 03/18/17 0308 03/19/17 0327  WBC 10.3  < > 6.7 8.2 6.7 10.1  NEUTROABS 8.1*  --  4.2 4.3 3.6  --   HGB 10.0*  < > 9.3* 10.5* 9.0* 9.1*  HCT 31.0*  < > 28.6* 31.8* 26.7* 27.0*  MCV 101.3*  < > 99.3 97.8 95.7 95.1  PLT 279  < > 247 265 230 237  < > = values in this interval not displayed.  Medications:    . atorvastatin  20 mg Oral q1800  . cyanocobalamin  1,000 mcg Intramuscular Once  . heparin subcutaneous  5,000 Units Subcutaneous Q8H  . metolazone  10 mg Oral BID  . metoprolol tartrate  50 mg Oral BID  .  predniSONE  5 mg Oral Q breakfast  . sodium bicarbonate  1,950 mg Oral TID  . tacrolimus  3 mg Oral BID      Madelon Lips MD [facility] pgr [phone] 03/19/2017, 12:44 PM

## 2017-03-20 ENCOUNTER — Telehealth: Payer: Self-pay | Admitting: Vascular Surgery

## 2017-03-20 LAB — RENAL FUNCTION PANEL
ANION GAP: 9 (ref 5–15)
Albumin: 1.4 g/dL — ABNORMAL LOW (ref 3.5–5.0)
Albumin: 1.6 g/dL — ABNORMAL LOW (ref 3.5–5.0)
Anion gap: 9 (ref 5–15)
BUN: 33 mg/dL — AB (ref 6–20)
BUN: 15 mg/dL (ref 6–20)
CHLORIDE: 104 mmol/L (ref 101–111)
CHLORIDE: 99 mmol/L — AB (ref 101–111)
CO2: 24 mmol/L (ref 22–32)
CO2: 25 mmol/L (ref 22–32)
CREATININE: 3.09 mg/dL — AB (ref 0.44–1.00)
Calcium: 6.7 mg/dL — ABNORMAL LOW (ref 8.9–10.3)
Calcium: 6.8 mg/dL — ABNORMAL LOW (ref 8.9–10.3)
Creatinine, Ser: 2.18 mg/dL — ABNORMAL HIGH (ref 0.44–1.00)
GFR calc Af Amer: 17 mL/min — ABNORMAL LOW (ref >60)
GFR calc non Af Amer: 22 mL/min — ABNORMAL LOW (ref >60)
GFR, EST AFRICAN AMERICAN: 25 mL/min — AB (ref >60)
GFR, EST NON AFRICAN AMERICAN: 14 mL/min — AB (ref >60)
GLUCOSE: 97 mg/dL (ref 65–99)
GLUCOSE: 122 mg/dL — AB (ref 65–99)
POTASSIUM: 3.6 mmol/L (ref 3.5–5.1)
POTASSIUM: 3.2 mmol/L — AB (ref 3.5–5.1)
Phosphorus: 5.8 mg/dL — ABNORMAL HIGH (ref 2.5–4.6)
Phosphorus: 3.1 mg/dL (ref 2.5–4.6)
Sodium: 137 mmol/L (ref 135–145)
Sodium: 133 mmol/L — ABNORMAL LOW (ref 135–145)

## 2017-03-20 MED ORDER — FUROSEMIDE 80 MG PO TABS
160.0000 mg | ORAL_TABLET | Freq: Once | ORAL | Status: AC
  Administered 2017-03-21: 160 mg via ORAL
  Filled 2017-03-20: qty 2

## 2017-03-20 NOTE — Progress Notes (Signed)
PROGRESS NOTE    Mary Chapman  KCL:[id number] DOB: July 13, 1945 DOA: 03/14/2017 PCP: [facility]   Chief Complaint  Patient presents with  . Shortness of Breath    Brief Narrative:  HPI on 03/15/2017 by Dr. Fuller Plan Mary Chapman is a 71 y.o. female with medical history significant of HTN, tobacco abuse, and focal segmental glomerularsclerosis; who presents with complaints of shortness of breath on exertion and worsening lower extremity swelling. Back in 07/2016 the patient was admitted to the hospital with anasarca found to be related to collapsing focal segmental glomerular sclerosis after kidney biopsy and ATN. She was treated with high-dose Lasix and required two sessions of hemodialysis before return of her kidney function. Since that time she had been being followed by Dr.  Waynette Buttery of nephrology. Patient notes that her Torsemide dose was decreased from 80 mg TID down to 100 mg twice daily on October 18 due to with the patient reported as decreased kidney function. Since her change in dosage patient notes being significantly short of breath with any kind of activity. Associated symptoms include Colace, wheezing, nausea, and loose watery stools.  Her and her nephrologist had recently discussed her likely need of being placed on hemodialysis for which she was scheduled to have an appointment with vascular surgery on November 1 for vascular access.  Interim history Nephrology consulted and are aggressively diuresing the patient. Patient's Labs not improving much so patient will need Dialysis Access [facility] and permanent HD Access) so Vascular Surgery consulted. Patient underwent vein mapping and patient was found to have Superficial Vein Thrombus throughout the Right Cephalic Vein so Left Arm Cephalic Vein will be used for fistula. S/p TDC and Fistula placement. Started HD on 11/1 Assessment & Plan   Acute Kidney Injury on Chronic kidney disease, Stage IV -In the setting of FSGS    -Nephrology consulted and appreciated -Vascular surgery consulted and appreciated, s/p access placement  -Continue IV lasix, metolazone, tacrolimus, prednisone  -Continue to monitor intake/output -Hepatitis B/C negative -UA showed Cloudy Urine, Moderate Hgb, >300 Protein, Few Bacteria, Granular and Hyaline Casts -continue hemodialysis, CLIP in process  Dyspnea secondary to hypervolemia -CXR negative for infection -Continue IV diuresis and volume control with HD -Continue neb treatments   Anasarca -Treatment and plan as above   Hyperkalemia -resolved, continue to monitor BMP  Elevated Troponin -Likely due to renal failure -Currently chest pain free  Macrocytic Anemia and Anemia of Chronic Kidney Disease -Anemia Panel showed Iron of 92, UIBC of 83, TIBC of 175, Saturations of 53 -hemoglobin currently 9.1 -Vitamin B12: 272, Folate 6.5 -Vitamin B12 replacement given  Essential Hypertension -Continue metoprolol and PRN hydralazine -continues to be on diuretics   Hyperlipidemia -lipid panel: TC 219, HDL 63, LDL 110, TG 229 -Continue statin   History of Tobacco Abuse -Patient reports quitting smoking at some point following her last hospitalization.  Hyperphosphatemia -Phos down to 5.8.  -continue to monitor, hopefully will correct with hemodialysis   DVT Prophylaxis  heparin  Code Status: Full  Family Communication: None at bedside  Disposition Plan: admitted, pending access placement/HD today  Consultants Nephrology Vascular surgery   Procedures  Vein Mapping US guided placement of Right Palindrome catheter, Left brachial cephalic AV fistula  Antibiotics   Anti-infectives    Start     Dose/Rate Route Frequency Ordered Stop   03/18/17 0600  ceFAZolin (ANCEF) IVPB 1 g/50 mL premix    Comments:  Send with pt to OR   1 g 100  mL/hr over 30 Minutes Intravenous To Surgery 03/17/17 1436 03/18/17 1110      Subjective:   Mary Chapman seen and  examined today.  Patient tolerated dialysis well yesterday. Has no complaints today. Denies chest pain, shortness of breath, abdominal pain, N/V/D/C. Feels her leg swelling has improved.   Objective:   Vitals:   03/19/17 1901 03/19/17 2215 03/20/17 0500 03/20/17 0538  BP: (!) 152/95 120/68  126/67  Pulse: 90 89  87  Resp: 17 16  18   Temp: 98.4 F (36.9 C) 99.1 F (37.3 C)  98.5 F (36.9 C)  TempSrc: Oral Oral  Oral  SpO2: 100% 97%  97%  Weight:   94 kg (207 lb 3.7 oz)   Height:        Intake/Output Summary (Last 24 hours) at 03/20/17 1325 Last data filed at 03/20/17 0601  Gross per 24 hour  Intake            972.5 ml  Output             1000 ml  Net            -27.5 ml   Filed Weights   03/19/17 1445 03/19/17 1815 03/20/17 0500  Weight: 95 kg (209 lb 7 oz) 94 kg (207 lb 3.7 oz) 94 kg (207 lb 3.7 oz)   Exam (no change from exam on 03/19/2017)  General: Well developed, well nourished, NAD, appears stated age  HEENT: NCAT, mucous membranes moist.   Cardiovascular: S1 S2 auscultated, 2/6SEM, RRR  Respiratory: Clear to auscultation bilaterally with equal chest rise  Abdomen: Soft, nontender, nondistended, + bowel sounds  Extremities: warm dry without cyanosis clubbing. 2-3+ LE edema  Neuro: AAOx3, nonfocal  Psych: Normal affect and demeanor with intact judgement and insight, pleasant  Data Reviewed: I have personally reviewed following labs and imaging studies  CBC:  Recent Labs Lab 03/14/17 2200  03/16/17 1025 03/17/17 0730 03/18/17 0308 03/19/17 0327 03/19/17 1403  WBC 10.3  < > 6.7 8.2 6.7 10.1 11.0*  NEUTROABS 8.1*  --  4.2 4.3 3.6  --   --   HGB 10.0*  < > 9.3* 10.5* 9.0* 9.1* 8.9*  HCT 31.0*  < > 28.6* 31.8* 26.7* 27.0* 26.0*  MCV 101.3*  < > 99.3 97.8 95.7 95.1 94.5  PLT 279  < > 247 265 230 237 215  < > = values in this interval not displayed. Basic Metabolic Panel:  Recent Labs Lab 03/16/17 1025 03/17/17 0730 03/18/17 0308 03/19/17 1403  03/20/17 0323  NA 140 140 140 139 137  K 4.2 3.9 3.7 3.4* 3.6  CL 117* 115* 115* 109 104  CO2 15* 14* 15* 20* 24  GLUCOSE 102* 122* 94 132* 97  BUN 68* 66* 68* 51* 33*  CREATININE 3.97* 3.89* 3.84* 3.48* 3.09*  CALCIUM 7.7* 7.5* 7.1* 6.9* 6.7*  MG 1.9 1.8 1.7  --   --   PHOS 10.2* 10.2* 9.7* 7.1* 5.8*   GFR: Estimated Creatinine Clearance: 19.6 mL/min (A) (by C-G formula based on SCr of 3.09 mg/dL (H)). Liver Function Tests:  Recent Labs Lab 03/14/17 2200 03/16/17 1025 03/17/17 0730 03/18/17 0308 03/19/17 1403 03/20/17 0323  AST 19 15 18 15   --   --   ALT 11* 12* 12* 10*  --   --   ALKPHOS 58 54 57 48  --   --   BILITOT 0.2* 0.2* 0.6 0.4  --   --   PROT 4.7* 4.0*  4.5* 3.9*  --   --   ALBUMIN 1.7* 1.4*  1.4* 1.6* 1.4* 1.4* 1.4*   No results for input(s): LIPASE, AMYLASE in the last 168 hours. No results for input(s): AMMONIA in the last 168 hours. Coagulation Profile:  Recent Labs Lab 03/18/17 0308  INR 0.97   Cardiac Enzymes:  Recent Labs Lab 03/14/17 2200 03/15/17 0513 03/15/17 1150  TROPONINI 0.03* <0.03 <0.03   BNP (last 3 results) No results for input(s): PROBNP in the last 8760 hours. HbA1C: No results for input(s): HGBA1C in the last 72 hours. CBG:  Recent Labs Lab 03/15/17 0832  GLUCAP 88   Lipid Profile:  Recent Labs  03/18/17 0309  CHOL 219*  HDL 63  LDLCALC 110*  TRIG 229*  CHOLHDL 3.5   Thyroid Function Tests: No results for input(s): TSH, T4TOTAL, FREET4, T3FREE, THYROIDAB in the last 72 hours. Anemia Panel: No results for input(s): VITAMINB12, FOLATE, FERRITIN, TIBC, IRON, RETICCTPCT in the last 72 hours. Urine analysis:    Component Value Date/Time   COLORURINE YELLOW 03/14/2017 2330   APPEARANCEUR CLOUDY (A) 03/14/2017 2330   LABSPEC 1.020 03/14/2017 2330   PHURINE 6.0 03/14/2017 2330   GLUCOSEU 100 (A) 03/14/2017 2330   HGBUR MODERATE (A) 03/14/2017 2330   BILIRUBINUR NEGATIVE 03/14/2017 2330   KETONESUR NEGATIVE  03/14/2017 2330   PROTEINUR >300 (A) 03/14/2017 2330   NITRITE NEGATIVE 03/14/2017 2330   LEUKOCYTESUR NEGATIVE 03/14/2017 2330   Sepsis Labs: @LABRCNTIP (procalcitonin:4,lacticidven:4)  ) Recent Results (from the past 240 hour(s))  Surgical pcr screen     Status: None   Collection Time: 03/17/17  9:58 PM  Result Value Ref Range Status   MRSA, PCR NEGATIVE NEGATIVE Final   Staphylococcus aureus NEGATIVE NEGATIVE Final    Comment: (NOTE) The Xpert SA Assay (FDA approved for NASAL specimens in patients 70 years of age and older), is one component of a comprehensive surveillance program. It is not intended to diagnose infection nor to guide or monitor treatment.       Radiology Studies: Dg Chest Port 1 View  Result Date: 03/18/2017 CLINICAL DATA:  Central line placement EXAM: PORTABLE CHEST 1 VIEW COMPARISON:  03/17/2017 FINDINGS: Right internal jugular venous catheter has its tip in the lower right atrium. No pneumothorax. The lungs are clear. No edema. IMPRESSION: Central line tip in the lower right atrium.  No pneumothorax. Electronically Signed   By: Nelson Chimes M.D.   On: 03/18/2017 13:34     Scheduled Meds: . atorvastatin  20 mg Oral q1800  . heparin subcutaneous  5,000 Units Subcutaneous Q8H  . metolazone  10 mg Oral BID  . metoprolol tartrate  50 mg Oral BID  . predniSONE  5 mg Oral Q breakfast  . sodium bicarbonate  1,950 mg Oral TID  . tacrolimus  3 mg Oral BID   Continuous Infusions: . sodium chloride    . sodium chloride    . sodium chloride 10 mL/hr at 03/18/17 0959  . furosemide Stopped (03/20/17 1149)     LOS: 5 days   Time Spent in minutes   30 minutes  Sahvanna Mcmanigal D.O. on 03/20/2017 at 1:25 PM  Between 7am to 7pm - Pager - [phone]  After 7pm go to [email or web address] - password [email or web address]  And look for the night coverage person covering for me after hours  [facility] Office  [phone]

## 2017-03-20 NOTE — Progress Notes (Signed)
Pt lost IV access. Pt states she is being discharged tomorrow after dialysis. She does not want another IV if she does not have to. This RN spoke with MD and was told order for Lasix would be switched to po.   Eleanora Neighbor, RN

## 2017-03-20 NOTE — Procedures (Signed)
Patient seen and examined on Hemodialysis. QB 400 mL/ min UF goal 1L  CLIP'd.  Treatment adjusted as needed.  Madelon Lips MD [facility] pgr [phone] 4:41 PM

## 2017-03-20 NOTE — Progress Notes (Signed)
Accepted at [place] 1st treatment is Tuesday 03/24/17 at 11:15 am .Schedule Tuesday,Thursday,Saturday 2nd shift .Chairtime 11:45 am

## 2017-03-20 NOTE — Telephone Encounter (Signed)
-----   Message from Mena Goes, RN sent at 03/18/2017  1:37 PM EDT ----- Regarding: 3-6 weeks with duplex   ----- Message ----- From: Elam Dutch, MD Sent: 03/18/2017   1:20 PM To: Vvs Charge Pool  Pt needs follow up appt with me in 3-6 weeks with duplex of fistula  Ruta Hinds

## 2017-03-20 NOTE — Progress Notes (Signed)
Subjective: Interval History: has no complaint of SOB, chest pain. Feels like LE edema has improved some. Tolerated HD well yesterday.  Objective: Vital signs in last 24 hours: Temp:  [97.3 F (36.3 C)-99.1 F (37.3 C)] 98.5 F (36.9 C) (11/02 0538) Pulse Rate:  [75-99] 87 (11/02 0538) Resp:  [16-18] 18 (11/02 0538) BP: (111-152)/(48-95) 126/67 (11/02 0538) SpO2:  [97 %-100 %] 97 % (11/02 0538) Weight:  [207 lb 3.7 oz (94 kg)-209 lb 7 oz (95 kg)] 207 lb 3.7 oz (94 kg) (11/02 0500) Weight change: 1 lb 7 oz (0.652 kg)  Intake/Output from previous day: 11/01 0701 - 11/02 0700 In: 1212.5 [P.O.:480; I.V.:402.5; IV Piggyback:330] Out: 1000  Intake/Output this shift: No intake/output data recorded.  General appearance: alert, cooperative and no distress Resp: clear to auscultation bilaterally Cardio: regular rate and rhythm, S1, S2 normal, no murmur, click, rub or gallop GI: soft, non-tender; bowel sounds normal; no masses,  no organomegaly Extremities: edema +3 bilaterally  Lab Results:  Recent Labs  03/19/17 0327 03/19/17 1403  WBC 10.1 11.0*  HGB 9.1* 8.9*  HCT 27.0* 26.0*  PLT 237 215   BMET:   Recent Labs  03/19/17 1403 03/20/17 0323  NA 139 137  K 3.4* 3.6  CL 109 104  CO2 20* 24  GLUCOSE 132* 97  BUN 51* 33*  CREATININE 3.48* 3.09*  CALCIUM 6.9* 6.7*   No results for input(s): PTH in the last 72 hours. Iron Studies: No results for input(s): IRON, TIBC, TRANSFERRIN, FERRITIN in the last 72 hours.  Studies/Results: Dg Chest Port 1 View  Result Date: 03/18/2017 CLINICAL DATA:  Central line placement EXAM: PORTABLE CHEST 1 VIEW COMPARISON:  03/17/2017 FINDINGS: Right internal jugular venous catheter has its tip in the lower right atrium. No pneumothorax. The lungs are clear. No edema. IMPRESSION: Central line tip in the lower right atrium.  No pneumothorax. Electronically Signed   By: Nelson Chimes M.D.   On: 03/18/2017 13:34    Scheduled: . atorvastatin   20 mg Oral q1800  . heparin subcutaneous  5,000 Units Subcutaneous Q8H  . metolazone  10 mg Oral BID  . metoprolol tartrate  50 mg Oral BID  . predniSONE  5 mg Oral Q breakfast  . sodium bicarbonate  1,950 mg Oral TID  . tacrolimus  3 mg Oral BID    Assessment/Plan: 1. 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  Continue prednisone 5 mg and prograf 3 mg BID for now, will need to taper down eventually.  S/p TDC and AVF, tolerated dialysis x2 sessions. CLIP in process. -Third HD session today  2. Hypertension: stable; well controlled on lopressor and high-dose Lasix with metolazone, improved with HD -continue current treatment  3. Anemia of ESRD: Hgb 8.9, will initiate ESA when appropriate -continue to monitor  4. Metabolic Bone Disease: PTH 61   Dispo: home likely 11/3 pending HD placement confirmation   LOS: 5 days   Steve Rattler 03/20/2017,11:57 AM

## 2017-03-20 NOTE — Care Management Important Message (Signed)
Important Message  Patient Details  Name: Mary Chapman MRN: [id number] Date of Birth: 1945/09/14   Medicare Important Message Given:  Yes    Nathen May 03/20/2017, 9:59 AM

## 2017-03-20 NOTE — Telephone Encounter (Signed)
Sched appt 04/30/17; lab at 11:00 and MD at 11:45. vm full, mailed appt letter.

## 2017-03-21 LAB — RENAL FUNCTION PANEL
ALBUMIN: 1.5 g/dL — AB (ref 3.5–5.0)
ANION GAP: 8 (ref 5–15)
BUN: 22 mg/dL — ABNORMAL HIGH (ref 6–20)
CALCIUM: 6.7 mg/dL — AB (ref 8.9–10.3)
CO2: 25 mmol/L (ref 22–32)
Chloride: 101 mmol/L (ref 101–111)
Creatinine, Ser: 3.18 mg/dL — ABNORMAL HIGH (ref 0.44–1.00)
GFR calc non Af Amer: 14 mL/min — ABNORMAL LOW (ref >60)
GFR, EST AFRICAN AMERICAN: 16 mL/min — AB (ref >60)
Glucose, Bld: 125 mg/dL — ABNORMAL HIGH (ref 65–99)
PHOSPHORUS: 4.3 mg/dL (ref 2.5–4.6)
POTASSIUM: 2.8 mmol/L — AB (ref 3.5–5.1)
SODIUM: 134 mmol/L — AB (ref 135–145)

## 2017-03-21 LAB — CBC
HEMATOCRIT: 25.5 % — AB (ref 36.0–46.0)
HEMOGLOBIN: 8.4 g/dL — AB (ref 12.0–15.0)
MCH: 32.1 pg (ref 26.0–34.0)
MCHC: 32.9 g/dL (ref 30.0–36.0)
MCV: 97.3 fL (ref 78.0–100.0)
Platelets: 165 10*3/uL (ref 150–400)
RBC: 2.62 MIL/uL — AB (ref 3.87–5.11)
RDW: 13.6 % (ref 11.5–15.5)
WBC: 10.5 10*3/uL (ref 4.0–10.5)

## 2017-03-21 LAB — POTASSIUM: Potassium: 3.3 mmol/L — ABNORMAL LOW (ref 3.5–5.1)

## 2017-03-21 MED ORDER — POTASSIUM CHLORIDE CRYS ER 20 MEQ PO TBCR
40.0000 meq | EXTENDED_RELEASE_TABLET | Freq: Once | ORAL | Status: AC
  Administered 2017-03-21: 40 meq via ORAL
  Filled 2017-03-21: qty 2

## 2017-03-21 MED ORDER — DEXTROSE 50 % IV SOLN
INTRAVENOUS | Status: AC
  Filled 2017-03-21: qty 50

## 2017-03-21 MED ORDER — DARBEPOETIN ALFA 200 MCG/0.4ML IJ SOSY
200.0000 ug | PREFILLED_SYRINGE | INTRAMUSCULAR | Status: DC
  Administered 2017-03-21: 200 ug via INTRAVENOUS
  Filled 2017-03-21: qty 0.4

## 2017-03-21 MED ORDER — DARBEPOETIN ALFA 200 MCG/0.4ML IJ SOSY
PREFILLED_SYRINGE | INTRAMUSCULAR | Status: AC
  Filled 2017-03-21: qty 0.4

## 2017-03-21 NOTE — Progress Notes (Signed)
Pt gone to dialysis via bed.   

## 2017-03-21 NOTE — Progress Notes (Signed)
Pt was discharged home at [place]. Daughter was taking her home. IV was already removed. Tele was removed. AVS was given to patient and reviewed. All questions were answered. Vitals were stable. Pt was accompanied by this RN via wheelchair to the car.   Eleanora Neighbor, RN

## 2017-03-21 NOTE — Progress Notes (Signed)
  Mary Chapman Progress Note    Assessment/ Plan:   1. 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  Continue prednisone 5 mg and prograf 3 mg BID for now, will need to taper down eventually.  S/p TDC and AVF, tolerating dialysis.  CLIP complete.  TTS [place].  May d/c from our perspective after HD today  2. Hypertension: improving; on lopressor, expect to improve with UF, high-dose diuretics d/c'd.  3. Anemia of ESRD: Hgb drifting downward, Aranesp with HD today.  % sat 53  4. Metabolic Bone Disease: PTH 61  5.  Dispo: OK from renal perspective to d/c after HD today  Subjective:    HD today.  Has been CLIP'd.    Objective:   BP 124/62 (BP Location: Right Arm)   Pulse 91   Temp 99.2 F (37.3 C) (Oral)   Resp 18   Ht 5\' 6"  (1.676 m)   Wt 93.3 kg (205 lb 11 oz)   SpO2 100%   BMI 33.20 kg/m   Intake/Output Summary (Last 24 hours) at 03/21/17 1046 Last data filed at 03/21/17 0641  Gross per 24 hour  Intake           781.83 ml  Output             1100 ml  Net          -318.17 ml   Weight change: 0.5 kg (1 lb 1.6 oz)  Physical Exam: GEN NAD, lying in bed, awake and alert HEENT sclerae anicteric NECK no JVD PULM normal WOB muffled at bases CV RRR systolic murmur ABD + mildly distended EXT 3+ LE edema, slightly improved NEURO AAO x 3 ACCESS: LUE AVF +T/B  Imaging: No results found.  Labs: BMET  Recent Labs Lab 03/16/17 1025 03/17/17 0730 03/18/17 0308 03/19/17 1403 03/20/17 0323 03/20/17 1955 03/21/17 0859  NA 140 140 140 139 137 133* 134*  K 4.2 3.9 3.7 3.4* 3.6 3.2* 2.8*  CL 117* 115* 115* 109 104 99* 101  CO2 15* 14* 15* 20* 24 25 25   GLUCOSE 102* 122* 94 132* 97 122* 125*  BUN 68* 66* 68* 51* 33* 15 22*  CREATININE 3.97* 3.89* 3.84* 3.48* 3.09* 2.18* 3.18*  CALCIUM 7.7* 7.5* 7.1* 6.9* 6.7* 6.8* 6.7*  PHOS 10.2* 10.2* 9.7* 7.1* 5.8* 3.1 4.3   CBC  Recent Labs Lab 03/14/17 2200  03/16/17 1025 03/17/17 0730  03/18/17 0308 03/19/17 0327 03/19/17 1403 03/21/17 0350  WBC 10.3  < > 6.7 8.2 6.7 10.1 11.0* 10.5  NEUTROABS 8.1*  --  4.2 4.3 3.6  --   --   --   HGB 10.0*  < > 9.3* 10.5* 9.0* 9.1* 8.9* 8.4*  HCT 31.0*  < > 28.6* 31.8* 26.7* 27.0* 26.0* 25.5*  MCV 101.3*  < > 99.3 97.8 95.7 95.1 94.5 97.3  PLT 279  < > 247 265 230 237 215 165  < > = values in this interval not displayed.  Medications:    . atorvastatin  20 mg Oral q1800  . heparin subcutaneous  5,000 Units Subcutaneous Q8H  . metolazone  10 mg Oral BID  . metoprolol tartrate  50 mg Oral BID  . predniSONE  5 mg Oral Q breakfast  . sodium bicarbonate  1,950 mg Oral TID  . tacrolimus  3 mg Oral BID      Madelon Lips MD [facility] pgr [phone] 03/21/2017, 10:46 AM

## 2017-03-21 NOTE — Discharge Summary (Signed)
Physician Discharge Summary  Mary Chapman WIO:[id number] DOB: 07/07/1945 DOA: 03/14/2017  PCP: [facility]  Admit date: 03/14/2017 Discharge date: 03/21/2017  Time spent: 45 minutes  Recommendations for Outpatient Follow-up:  Patient will be discharged to home.  Patient will need to follow up with primary care provider within one week of discharge. Continue hemodialysis as scheduled. Follow up with Dr. Oneida Alar, vascular surgery, in 3-6 weeks. Patient should continue medications as prescribed.  Patient should follow a renal diet with 1261ml fluid restriction.   Discharge Diagnoses:  Acute Kidney Injury on Chronic kidney disease, Stage IV Dyspnea secondary to hypervolemia Anasarca Hyperkalemia Elevated Troponin Macrocytic Anemia and Anemia of Chronic Kidney Disease Essential Hypertension Hyperlipidemia History of Tobacco Abuse Hyperphosphatemia  Discharge Condition: Stable  Diet recommendation: Renal diet with 1220ml fluid restriction  Filed Weights   03/20/17 1820 03/21/17 0128 03/21/17 1225  Weight: 93.3 kg (205 lb 11 oz) 93.3 kg (205 lb 11 oz) 93 kg (205 lb 0.4 oz)    History of present illness:  on 03/15/2017 by Dr. Rayburn Felt a 71 y.o.femalewith medical history significant of HTN, tobacco abuse, and focal segmental glomerularsclerosis; who presents withcomplaints of shortness of breath on exertion and worsening lower extremity swelling. Back in 07/2016 the patient was admitted to the hospital with anasarcafound to berelated to collapsing focal segmental glomerular sclerosis after kidney biopsy and ATN. She was treated with high-dose Lasix and required two sessions of hemodialysis before return of herkidney function. Since that time she had been being followed by Dr. Waynette Buttery of nephrology. Patient notes that her Torsemide dose was decreased from 80 mg TID down to 100 mg twice daily on October 18 due to with the patient reported as decreased kidney  function. Since her change in dosage patient notes being significantly short of breath with any kind of activity. Associated symptoms include Colace, wheezing, nausea, and loose watery stools. Her and her nephrologist had recently discussed her likely need of being placed on hemodialysis for which she was scheduled to have an appointment with vascular surgery on November 1 for vascular access.  Hospital Course:  Acute Kidney Injury on Chronic kidney disease, Stage IV -In the setting of FSGS , now ESRD -Nephrology consulted and appreciated -Vascular surgery consulted and appreciated, s/p access placement  -Continue IV lasix, metolazone, tacrolimus, prednisone  -Continue to monitor intake/output -Hepatitis B/C negative -UA showed Cloudy Urine, Moderate Hgb, >300 Protein, Few Bacteria, Granular and Hyaline Casts -continue hemodialysis TTS- [place]  Dyspnea secondary to hypervolemia -CXR negative for infection -Continue IV diuresis and volume control with HD -Continue neb treatments   Anasarca -Treatment and plan as above   Hyperkalemia -resolved, continue to monitor BMP  Elevated Troponin -Likely due to renal failure -Currently chest pain free  Macrocytic Anemia and Anemia of Chronic Kidney Disease -Anemia Panel showed Iron of 92, UIBC of 83, TIBC of 175, Saturations of 53 -hemoglobin currently 8.4 -Vitamin B12: 272, Folate 6.5 -Vitamin B12 replacement given  Essential Hypertension -Continue metoprolol and PRN hydralazine -continues to be on diuretics   Hyperlipidemia -lipid panel: TC 219, HDL 63, LDL 110, TG 229 -Continue statin   History of Tobacco Abuse -Patient reports quitting smoking at some point following her last hospitalization.  Hyperphosphatemia -Phos down to 4.3  -continue to monitor, hopefully will correct with hemodialysis   Consultants Nephrology Vascular surgery   Procedures  Vein Mapping US guided placement of Right Palindrome  catheter, Left brachial cephalic AV fistula  Discharge Exam: Vitals:  03/21/17 1300 03/21/17 1330  BP: 124/74 126/76  Pulse: 85 85  Resp:    Temp:    SpO2:     Patient states she is feeling better today. Denies chest pain, shortness of breath, abdominal pain, nausea, vomiting, diarrhea, constipation, dizziness, headache. Feels leg swelling has improved.    General: Well developed, well nourished, NAD, appears stated age  671: NCAT, mucous membranes moist.  Cardiovascular: S1 S2 auscultated, RRR, 2/6SEM  Respiratory: Clear to auscultation bilaterally with equal chest rise  Abdomen: Soft, nontender, nondistended, + bowel sounds  Extremities: warm dry without cyanosis clubbing. +2-3 LE edema  Neuro: AAOx3, nonfocal  Psych: Normal affect and demeanor with intact judgement and insight, pleasant  Discharge Instructions  Current Discharge Medication List    CONTINUE these medications which have NOT CHANGED   Details  metolazone (ZAROXOLYN) 10 MG tablet Take 10 mg by mouth 2 (two) times daily. Refills: 5    metoprolol tartrate (LOPRESSOR) 50 MG tablet Take 50 mg by mouth 2 (two) times daily. Refills: 5    multivitamin (RENA-VIT) TABS tablet Take 1 tablet by mouth at bedtime. Qty: 30 tablet, Refills: 3    potassium chloride SA (K-DUR,KLOR-CON) 20 MEQ tablet Take 2 tablets (40 mEq total) by mouth 2 (two) times daily. Qty: 120 tablet, Refills: 0    predniSONE (DELTASONE) 5 MG tablet Take 5 mg by mouth daily. Refills: 5    rosuvastatin (CRESTOR) 20 MG tablet Take 20 mg by mouth at bedtime.     tacrolimus (PROGRAF) 5 MG capsule Take 5 mg by mouth 2 (two) times daily. Refills: 5    torsemide (DEMADEX) 100 MG tablet Take 100 mg by mouth 2 (two) times daily. Refills: 5    Vitamin D, Ergocalciferol, (DRISDOL) 50000 units CAPS capsule Take 50,000 Units by mouth once a week. Refills: 5    furosemide (LASIX) 80 MG tablet Take 2 tablets (160 mg total) by mouth 3 (three)  times daily. Qty: 60 tablet, Refills: 0    nicotine (NICODERM CQ - DOSED IN MG/24 HOURS) 21 mg/24hr patch Place 1 patch (21 mg total) onto the skin daily. Qty: 28 patch, Refills: 0      STOP taking these medications     atorvastatin (LIPITOR) 20 MG tablet        No Known Allergies Follow-up Information    Elam Dutch, MD Follow up in 6 week(s).   Specialties:  Vascular Surgery, Cardiology Why:  Office will call you to arrange your appt (sent) Contact information: [place] [address] [place], [place] an appointment as soon as possible for a visit in 1 week(s).   Why:  Hospital follow up Contact information: [place] [address] [phone]            The results of significant diagnostics from this hospitalization (including imaging, microbiology, ancillary and laboratory) are listed below for reference.    Significant Diagnostic Studies: Dg Chest 2 View  Result Date: 03/14/2017 CLINICAL DATA:  71 year old female with shortness of breath. EXAM: CHEST  2 VIEW COMPARISON:  Chest radiograph dated 09/23/2016 FINDINGS: Minimal bibasilar densities may represent atelectatic changes. Clinical correlation is recommended. There is no focal consolidation. No large pleural effusion or pneumothorax. The cardiac silhouette is within normal limits. No acute osseous pathology. IMPRESSION: Minimal bibasilar atelectasis.  No focal consolidation. Electronically Signed   By: Anner Crete M.D.   On: 03/14/2017 21:41  Dg Chest Port 1 View  Result Date: 03/18/2017 CLINICAL DATA:  Central line placement EXAM: PORTABLE CHEST 1 VIEW COMPARISON:  03/17/2017 FINDINGS: Right internal jugular venous catheter has its tip in the lower right atrium. No pneumothorax. The lungs are clear. No edema. IMPRESSION: Central line tip in the lower right atrium.  No pneumothorax. Electronically Signed   By: Nelson Chimes M.D.    On: 03/18/2017 13:34   Dg Chest Port 1 View  Result Date: 03/17/2017 CLINICAL DATA:  71 year old female with shortness of Breath on exertion. Anasarca, glomerulosclerosis, acute on chronic renal failure. EXAM: PORTABLE CHEST 1 VIEW COMPARISON:  03/14/2017 and earlier. FINDINGS: Portable AP semi upright view at 0630 hours. Stable mild cardiomegaly and mediastinal contours. Mildly lower lung volumes. No pneumothorax. Stable pulmonary vascular congestion without overt edema. Possible trace fluid along the right minor fissure, versus mild atelectasis. Small if any layering pleural effusions. No other confluent opacity. Visualized tracheal air column is within normal limits. IMPRESSION: Mildly lower lung volumes. Stable vascular congestion without overt edema. Possible small effusions. Electronically Signed   By: Genevie Ann M.D.   On: 03/17/2017 08:52   Dg Fluoro Guide Cv Line-no Report  Result Date: 03/18/2017 Fluoroscopy was utilized by the requesting physician.  No radiographic interpretation.    Microbiology: Recent Results (from the past 240 hour(s))  Surgical pcr screen     Status: None   Collection Time: 03/17/17  9:58 PM  Result Value Ref Range Status   MRSA, PCR NEGATIVE NEGATIVE Final   Staphylococcus aureus NEGATIVE NEGATIVE Final    Comment: (NOTE) The Xpert SA Assay (FDA approved for NASAL specimens in patients 65 years of age and older), is one component of a comprehensive surveillance program. It is not intended to diagnose infection nor to guide or monitor treatment.      Labs: Basic Metabolic Panel:  Recent Labs Lab 03/16/17 1025 03/17/17 0730 03/18/17 0308 03/19/17 1403 03/20/17 0323 03/20/17 1955 03/21/17 0859  NA 140 140 140 139 137 133* 134*  K 4.2 3.9 3.7 3.4* 3.6 3.2* 2.8*  CL 117* 115* 115* 109 104 99* 101  CO2 15* 14* 15* 20* 24 25 25   GLUCOSE 102* 122* 94 132* 97 122* 125*  BUN 68* 66* 68* 51* 33* 15 22*  CREATININE 3.97* 3.89* 3.84* 3.48* 3.09* 2.18*  3.18*  CALCIUM 7.7* 7.5* 7.1* 6.9* 6.7* 6.8* 6.7*  MG 1.9 1.8 1.7  --   --   --   --   PHOS 10.2* 10.2* 9.7* 7.1* 5.8* 3.1 4.3   Liver Function Tests:  Recent Labs Lab 03/14/17 2200 03/16/17 1025 03/17/17 0730 03/18/17 0308 03/19/17 1403 03/20/17 0323 03/20/17 1955 03/21/17 0859  AST 19 15 18 15   --   --   --   --   ALT 11* 12* 12* 10*  --   --   --   --   ALKPHOS 58 54 57 48  --   --   --   --   BILITOT 0.2* 0.2* 0.6 0.4  --   --   --   --   PROT 4.7* 4.0* 4.5* 3.9*  --   --   --   --   ALBUMIN 1.7* 1.4*  1.4* 1.6* 1.4* 1.4* 1.4* 1.6* 1.5*   No results for input(s): LIPASE, AMYLASE in the last 168 hours. No results for input(s): AMMONIA in the last 168 hours. CBC:  Recent Labs Lab 03/14/17 2200  03/16/17 1025 03/17/17 0730 03/18/17  0308 03/19/17 0327 03/19/17 1403 03/21/17 0350  WBC 10.3  < > 6.7 8.2 6.7 10.1 11.0* 10.5  NEUTROABS 8.1*  --  4.2 4.3 3.6  --   --   --   HGB 10.0*  < > 9.3* 10.5* 9.0* 9.1* 8.9* 8.4*  HCT 31.0*  < > 28.6* 31.8* 26.7* 27.0* 26.0* 25.5*  MCV 101.3*  < > 99.3 97.8 95.7 95.1 94.5 97.3  PLT 279  < > 247 265 230 237 215 165  < > = values in this interval not displayed. Cardiac Enzymes:  Recent Labs Lab 03/14/17 2200 03/15/17 0513 03/15/17 1150  TROPONINI 0.03* <0.03 <0.03   BNP: BNP (last 3 results)  Recent Labs  08/07/16 1850 03/14/17 2200  BNP 42.0 141.1*    ProBNP (last 3 results) No results for input(s): PROBNP in the last 8760 hours.  CBG:  Recent Labs Lab 03/15/17 0832  GLUCAP 88       Signed:  Cristal Ford  [facility] 03/21/2017, 2:06 PM

## 2017-03-25 ENCOUNTER — Encounter: Payer: Medicare Other | Admitting: Vascular Surgery

## 2017-04-17 ENCOUNTER — Other Ambulatory Visit: Payer: Self-pay

## 2017-04-17 DIAGNOSIS — N179 Acute kidney failure, unspecified: Secondary | ICD-10-CM

## 2017-04-17 DIAGNOSIS — Z48812 Encounter for surgical aftercare following surgery on the circulatory system: Secondary | ICD-10-CM

## 2017-04-30 ENCOUNTER — Encounter: Payer: Self-pay | Admitting: Vascular Surgery

## 2017-04-30 ENCOUNTER — Ambulatory Visit (HOSPITAL_COMMUNITY)
Admit: 2017-04-30 | Discharge: 2017-04-30 | Disposition: A | Discharge status: Home / Self Care | Payer: Medicare Other | Attending: Vascular Surgery | Admitting: Vascular Surgery

## 2017-04-30 ENCOUNTER — Ambulatory Visit (INDEPENDENT_AMBULATORY_CARE_PROVIDER_SITE_OTHER): Payer: Medicare Other | Admitting: Vascular Surgery

## 2017-04-30 VITALS — BP 160/89 | HR 70 | Temp 98.9°F | Resp 20 | Ht 66.0 in | Wt 175.0 lb

## 2017-04-30 DIAGNOSIS — N179 Acute kidney failure, unspecified: Secondary | ICD-10-CM | POA: Diagnosis not present

## 2017-04-30 DIAGNOSIS — Z48812 Encounter for surgical aftercare following surgery on the circulatory system: Secondary | ICD-10-CM

## 2017-04-30 DIAGNOSIS — N186 End stage renal disease: Secondary | ICD-10-CM

## 2017-04-30 DIAGNOSIS — Z992 Dependence on renal dialysis: Secondary | ICD-10-CM

## 2017-04-30 NOTE — Progress Notes (Signed)
Patient is a 71 year old female who returns for postoperative follow-up today.  She had a left brachiocephalic AV fistula created on March 18, 2017.  Currently is using a right-sided catheter.  She reports no numbness or tingling in her hand.  She has no incisional drainage.  Physical exam:   Vitals:   04/30/17 1149  BP: (!) 160/89  Pulse: 70  Resp: 20  Temp: 98.9 F (37.2 C)  SpO2: 96%  Weight: 175 lb (79.4 kg)  Height: 5\' 6"  (1.676 m)    Fistula has an easily palpable thrill.  The fistula is palpable in the upper arm.  Left hand is pink warm well perfused.  Data: Patient had a duplex ultrasound of her AV fistula today.  Diameter was 7-1/2 mm.  Depth was 4-5 mm.  Assessment: Maturing AV fistula left arm.  Plan: The fistula should be ready for cannulation June 18, 2016.  The patient will follow up with [place] on an as-needed basis.  Ruta Hinds, MD [facility] Office: [phone] Pager: [phone]

## 2017-06-26 ENCOUNTER — Ambulatory Visit
Admission: RE | Admit: 2017-06-26 | Discharge: 2017-06-26 | Disposition: A | Discharge status: Home / Self Care | Payer: Medicare Other | Source: Ambulatory Visit | Attending: Nephrology | Admitting: Nephrology

## 2017-06-26 ENCOUNTER — Other Ambulatory Visit: Payer: Self-pay | Admitting: Nephrology

## 2017-06-26 DIAGNOSIS — R059 Cough, unspecified: Secondary | ICD-10-CM

## 2017-06-26 DIAGNOSIS — R05 Cough: Secondary | ICD-10-CM

## 2018-02-23 ENCOUNTER — Inpatient Hospital Stay (HOSPITAL_BASED_OUTPATIENT_CLINIC_OR_DEPARTMENT_OTHER)
Admission: EM | Admit: 2018-02-23 | Discharge: 2018-03-06 | DRG: 374 | Disposition: A | Discharge status: Home / Self Care | Payer: Medicare Other | Attending: Internal Medicine | Admitting: Internal Medicine

## 2018-02-23 ENCOUNTER — Emergency Department (HOSPITAL_BASED_OUTPATIENT_CLINIC_OR_DEPARTMENT_OTHER): Payer: Medicare Other

## 2018-02-23 ENCOUNTER — Other Ambulatory Visit: Payer: Self-pay

## 2018-02-23 ENCOUNTER — Encounter (HOSPITAL_BASED_OUTPATIENT_CLINIC_OR_DEPARTMENT_OTHER): Payer: Self-pay | Admitting: *Deleted

## 2018-02-23 ENCOUNTER — Inpatient Hospital Stay (HOSPITAL_COMMUNITY): Payer: Medicare Other

## 2018-02-23 DIAGNOSIS — K5792 Diverticulitis of intestine, part unspecified, without perforation or abscess without bleeding: Secondary | ICD-10-CM | POA: Diagnosis present

## 2018-02-23 DIAGNOSIS — K2970 Gastritis, unspecified, without bleeding: Secondary | ICD-10-CM | POA: Diagnosis not present

## 2018-02-23 DIAGNOSIS — I700 Atherosclerosis of aorta: Secondary | ICD-10-CM | POA: Diagnosis present

## 2018-02-23 DIAGNOSIS — K6389 Other specified diseases of intestine: Secondary | ICD-10-CM

## 2018-02-23 DIAGNOSIS — N2581 Secondary hyperparathyroidism of renal origin: Secondary | ICD-10-CM | POA: Diagnosis present

## 2018-02-23 DIAGNOSIS — K750 Abscess of liver: Secondary | ICD-10-CM

## 2018-02-23 DIAGNOSIS — K8020 Calculus of gallbladder without cholecystitis without obstruction: Secondary | ICD-10-CM | POA: Diagnosis present

## 2018-02-23 DIAGNOSIS — C180 Malignant neoplasm of cecum: Secondary | ICD-10-CM | POA: Diagnosis present

## 2018-02-23 DIAGNOSIS — K3189 Other diseases of stomach and duodenum: Secondary | ICD-10-CM | POA: Diagnosis present

## 2018-02-23 DIAGNOSIS — I120 Hypertensive chronic kidney disease with stage 5 chronic kidney disease or end stage renal disease: Secondary | ICD-10-CM | POA: Diagnosis present

## 2018-02-23 DIAGNOSIS — R932 Abnormal findings on diagnostic imaging of liver and biliary tract: Secondary | ICD-10-CM | POA: Diagnosis not present

## 2018-02-23 DIAGNOSIS — E875 Hyperkalemia: Secondary | ICD-10-CM | POA: Diagnosis present

## 2018-02-23 DIAGNOSIS — K449 Diaphragmatic hernia without obstruction or gangrene: Secondary | ICD-10-CM | POA: Diagnosis present

## 2018-02-23 DIAGNOSIS — K644 Residual hemorrhoidal skin tags: Secondary | ICD-10-CM | POA: Diagnosis not present

## 2018-02-23 DIAGNOSIS — Z720 Tobacco use: Secondary | ICD-10-CM | POA: Diagnosis present

## 2018-02-23 DIAGNOSIS — I10 Essential (primary) hypertension: Secondary | ICD-10-CM | POA: Diagnosis not present

## 2018-02-23 DIAGNOSIS — K625 Hemorrhage of anus and rectum: Secondary | ICD-10-CM | POA: Diagnosis not present

## 2018-02-23 DIAGNOSIS — K921 Melena: Secondary | ICD-10-CM | POA: Diagnosis not present

## 2018-02-23 DIAGNOSIS — C229 Malignant neoplasm of liver, not specified as primary or secondary: Secondary | ICD-10-CM | POA: Diagnosis present

## 2018-02-23 DIAGNOSIS — E876 Hypokalemia: Secondary | ICD-10-CM | POA: Diagnosis not present

## 2018-02-23 DIAGNOSIS — K633 Ulcer of intestine: Secondary | ICD-10-CM | POA: Diagnosis present

## 2018-02-23 DIAGNOSIS — D62 Acute posthemorrhagic anemia: Secondary | ICD-10-CM | POA: Diagnosis not present

## 2018-02-23 DIAGNOSIS — D631 Anemia in chronic kidney disease: Secondary | ICD-10-CM | POA: Diagnosis present

## 2018-02-23 DIAGNOSIS — K922 Gastrointestinal hemorrhage, unspecified: Secondary | ICD-10-CM | POA: Diagnosis present

## 2018-02-23 DIAGNOSIS — Z79899 Other long term (current) drug therapy: Secondary | ICD-10-CM

## 2018-02-23 DIAGNOSIS — N200 Calculus of kidney: Secondary | ICD-10-CM | POA: Diagnosis present

## 2018-02-23 DIAGNOSIS — E785 Hyperlipidemia, unspecified: Secondary | ICD-10-CM | POA: Diagnosis present

## 2018-02-23 DIAGNOSIS — R160 Hepatomegaly, not elsewhere classified: Secondary | ICD-10-CM

## 2018-02-23 DIAGNOSIS — F17210 Nicotine dependence, cigarettes, uncomplicated: Secondary | ICD-10-CM | POA: Diagnosis present

## 2018-02-23 DIAGNOSIS — K2980 Duodenitis without bleeding: Secondary | ICD-10-CM | POA: Diagnosis not present

## 2018-02-23 DIAGNOSIS — Z992 Dependence on renal dialysis: Secondary | ICD-10-CM

## 2018-02-23 DIAGNOSIS — R933 Abnormal findings on diagnostic imaging of other parts of digestive tract: Secondary | ICD-10-CM | POA: Diagnosis not present

## 2018-02-23 DIAGNOSIS — N186 End stage renal disease: Secondary | ICD-10-CM | POA: Diagnosis present

## 2018-02-23 DIAGNOSIS — K640 First degree hemorrhoids: Secondary | ICD-10-CM | POA: Diagnosis not present

## 2018-02-23 DIAGNOSIS — N051 Unspecified nephritic syndrome with focal and segmental glomerular lesions: Secondary | ICD-10-CM | POA: Diagnosis not present

## 2018-02-23 DIAGNOSIS — K5730 Diverticulosis of large intestine without perforation or abscess without bleeding: Secondary | ICD-10-CM | POA: Diagnosis not present

## 2018-02-23 DIAGNOSIS — K228 Other specified diseases of esophagus: Secondary | ICD-10-CM | POA: Diagnosis not present

## 2018-02-23 HISTORY — DX: Diverticulitis of intestine, part unspecified, without perforation or abscess without bleeding: K57.92

## 2018-02-23 LAB — CBC WITH DIFFERENTIAL/PLATELET
BASOS ABS: 0 10*3/uL (ref 0.0–0.1)
Basophils Relative: 0 %
Eosinophils Absolute: 0 10*3/uL (ref 0.0–0.5)
Eosinophils Relative: 0 %
HEMATOCRIT: 39.2 % (ref 36.0–46.0)
Hemoglobin: 13 g/dL (ref 12.0–15.0)
LYMPHS ABS: 1 10*3/uL (ref 0.7–4.0)
Lymphocytes Relative: 5 %
MCH: 31.6 pg (ref 26.0–34.0)
MCHC: 33.2 g/dL (ref 30.0–36.0)
MCV: 95.1 fL (ref 80.0–100.0)
MONOS PCT: 5 %
Monocytes Absolute: 1 10*3/uL (ref 0.1–1.0)
NEUTROS ABS: 18.3 10*3/uL — AB (ref 1.7–7.7)
Neutrophils Relative %: 90 %
Platelets: 201 10*3/uL (ref 150–400)
RBC: 4.12 MIL/uL (ref 3.87–5.11)
RDW: 17 % — AB (ref 11.5–15.5)
WBC: 20.3 10*3/uL — ABNORMAL HIGH (ref 4.0–10.5)

## 2018-02-23 LAB — COMPREHENSIVE METABOLIC PANEL
ALT: 36 U/L (ref 0–44)
AST: 50 U/L — ABNORMAL HIGH (ref 15–41)
Albumin: 2.8 g/dL — ABNORMAL LOW (ref 3.5–5.0)
Alkaline Phosphatase: 110 U/L (ref 38–126)
Anion gap: 15 (ref 5–15)
BILIRUBIN TOTAL: 0.5 mg/dL (ref 0.3–1.2)
BUN: 39 mg/dL — AB (ref 8–23)
CALCIUM: 7.6 mg/dL — AB (ref 8.9–10.3)
CHLORIDE: 92 mmol/L — AB (ref 98–111)
CO2: 28 mmol/L (ref 22–32)
CREATININE: 7.79 mg/dL — AB (ref 0.44–1.00)
GFR, EST AFRICAN AMERICAN: 5 mL/min — AB (ref >60)
GFR, EST NON AFRICAN AMERICAN: 5 mL/min — AB (ref >60)
Glucose, Bld: 95 mg/dL (ref 70–99)
Potassium: 6 mmol/L — ABNORMAL HIGH (ref 3.5–5.1)
Sodium: 135 mmol/L (ref 135–145)
TOTAL PROTEIN: 7 g/dL (ref 6.5–8.1)

## 2018-02-23 LAB — LIPASE, BLOOD: LIPASE: 35 U/L (ref 11–51)

## 2018-02-23 LAB — I-STAT CG4 LACTIC ACID, ED: LACTIC ACID, VENOUS: 1.76 mmol/L (ref 0.5–1.9)

## 2018-02-23 MED ORDER — MORPHINE SULFATE (PF) 2 MG/ML IV SOLN
2.0000 mg | Freq: Once | INTRAVENOUS | Status: AC
  Administered 2018-02-23: 2 mg via INTRAVENOUS
  Filled 2018-02-23: qty 1

## 2018-02-23 MED ORDER — ORAL CARE MOUTH RINSE
15.0000 mL | Freq: Two times a day (BID) | OROMUCOSAL | Status: DC
  Administered 2018-02-24 – 2018-03-06 (×13): 15 mL via OROMUCOSAL

## 2018-02-23 MED ORDER — ACETAMINOPHEN 325 MG PO TABS
650.0000 mg | ORAL_TABLET | Freq: Four times a day (QID) | ORAL | Status: DC | PRN
  Administered 2018-02-25 – 2018-03-02 (×4): 650 mg via ORAL
  Filled 2018-02-23 (×4): qty 2

## 2018-02-23 MED ORDER — NEPRO/CARBSTEADY PO LIQD
237.0000 mL | Freq: Three times a day (TID) | ORAL | Status: DC | PRN
  Filled 2018-02-23: qty 237

## 2018-02-23 MED ORDER — FENTANYL 40 MCG/ML IV SOLN
INTRAVENOUS | Status: DC
  Administered 2018-02-23: 20:00:00 via INTRAVENOUS
  Administered 2018-02-24: 2.5 mL via INTRAVENOUS
  Administered 2018-02-24: 3 mL via INTRAVENOUS
  Filled 2018-02-23 (×4): qty 25

## 2018-02-23 MED ORDER — NALOXONE HCL 0.4 MG/ML IJ SOLN: 0.4000 mg | INTRAMUSCULAR | Status: DC | PRN

## 2018-02-23 MED ORDER — DEXTROSE 50 % IV SOLN
1.0000 | Freq: Once | INTRAVENOUS | Status: AC
  Administered 2018-02-23: 50 mL via INTRAVENOUS
  Filled 2018-02-23: qty 50

## 2018-02-23 MED ORDER — SODIUM CHLORIDE 0.9% FLUSH: 9.0000 mL | INTRAVENOUS | Status: DC | PRN

## 2018-02-23 MED ORDER — ONDANSETRON HCL 4 MG/2ML IJ SOLN
4.0000 mg | Freq: Once | INTRAMUSCULAR | Status: AC
  Administered 2018-02-23: 4 mg via INTRAVENOUS
  Filled 2018-02-23: qty 2

## 2018-02-23 MED ORDER — CALCIUM CARBONATE ANTACID 1250 MG/5ML PO SUSP
500.0000 mg | Freq: Four times a day (QID) | ORAL | Status: DC | PRN
  Filled 2018-02-23: qty 5

## 2018-02-23 MED ORDER — RENA-VITE PO TABS
1.0000 | ORAL_TABLET | Freq: Every day | ORAL | Status: DC
  Administered 2018-02-23 – 2018-03-05 (×11): 1 via ORAL
  Filled 2018-02-23 (×11): qty 1

## 2018-02-23 MED ORDER — CAMPHOR-MENTHOL 0.5-0.5 % EX LOTN: 1.0000 "application " | TOPICAL_LOTION | Freq: Three times a day (TID) | CUTANEOUS | Status: DC | PRN

## 2018-02-23 MED ORDER — METOPROLOL TARTRATE 50 MG PO TABS
50.0000 mg | ORAL_TABLET | Freq: Two times a day (BID) | ORAL | Status: DC
  Administered 2018-02-23: 50 mg via ORAL
  Filled 2018-02-23: qty 1

## 2018-02-23 MED ORDER — FENTANYL CITRATE (PF) 100 MCG/2ML IJ SOLN
50.0000 ug | Freq: Once | INTRAMUSCULAR | Status: AC
  Administered 2018-02-23: 50 ug via INTRAVENOUS
  Filled 2018-02-23: qty 2

## 2018-02-23 MED ORDER — SODIUM CHLORIDE 0.9 % IV SOLN
INTRAVENOUS | Status: DC | PRN
  Administered 2018-02-23: 14:00:00 via INTRAVENOUS

## 2018-02-23 MED ORDER — PIPERACILLIN-TAZOBACTAM 3.375 G IVPB
3.3750 g | Freq: Two times a day (BID) | INTRAVENOUS | Status: DC
  Administered 2018-02-23 – 2018-03-04 (×19): 3.375 g via INTRAVENOUS
  Filled 2018-02-23 (×20): qty 50

## 2018-02-23 MED ORDER — PREDNISONE 5 MG PO TABS: 5.0000 mg | ORAL_TABLET | Freq: Every day | ORAL | Status: DC

## 2018-02-23 MED ORDER — ONDANSETRON HCL 4 MG/2ML IJ SOLN: 4.0000 mg | Freq: Four times a day (QID) | INTRAMUSCULAR | Status: DC | PRN

## 2018-02-23 MED ORDER — INSULIN REGULAR HUMAN 100 UNIT/ML IJ SOLN
5.0000 [IU] | Freq: Once | INTRAMUSCULAR | Status: DC
  Filled 2018-02-23: qty 1

## 2018-02-23 MED ORDER — INSULIN REGULAR HUMAN 100 UNIT/ML IJ SOLN
5.0000 [IU] | Freq: Once | INTRAMUSCULAR | Status: AC
  Administered 2018-02-23: 5 [IU] via INTRAVENOUS

## 2018-02-23 MED ORDER — NICOTINE 14 MG/24HR TD PT24
14.0000 mg | MEDICATED_PATCH | Freq: Every day | TRANSDERMAL | Status: DC
  Filled 2018-02-23 (×8): qty 1

## 2018-02-23 MED ORDER — DIPHENHYDRAMINE HCL 50 MG/ML IJ SOLN: 12.5000 mg | Freq: Four times a day (QID) | INTRAMUSCULAR | Status: DC | PRN

## 2018-02-23 MED ORDER — HYDROXYZINE HCL 25 MG PO TABS: 25.0000 mg | ORAL_TABLET | Freq: Three times a day (TID) | ORAL | Status: DC | PRN

## 2018-02-23 MED ORDER — DOCUSATE SODIUM 283 MG RE ENEM
1.0000 | ENEMA | RECTAL | Status: DC | PRN
  Filled 2018-02-23: qty 1

## 2018-02-23 MED ORDER — ZOLPIDEM TARTRATE 5 MG PO TABS: 5.0000 mg | ORAL_TABLET | Freq: Every evening | ORAL | Status: DC | PRN

## 2018-02-23 MED ORDER — TACROLIMUS 1 MG PO CAPS: 5.0000 mg | ORAL_CAPSULE | Freq: Two times a day (BID) | ORAL | Status: DC

## 2018-02-23 MED ORDER — ACETAMINOPHEN 650 MG RE SUPP: 650.0000 mg | Freq: Four times a day (QID) | RECTAL | Status: DC | PRN

## 2018-02-23 MED ORDER — ROSUVASTATIN CALCIUM 20 MG PO TABS: 20.0000 mg | ORAL_TABLET | Freq: Every day | ORAL | Status: DC

## 2018-02-23 MED ORDER — SORBITOL 70 % SOLN
30.0000 mL | Status: DC | PRN
  Filled 2018-02-23: qty 30

## 2018-02-23 MED ORDER — IOHEXOL 300 MG/ML  SOLN
100.0000 mL | Freq: Once | INTRAMUSCULAR | Status: AC | PRN
  Administered 2018-02-23: 100 mL via INTRAVENOUS

## 2018-02-23 MED ORDER — DIPHENHYDRAMINE HCL 12.5 MG/5ML PO ELIX: 12.5000 mg | ORAL_SOLUTION | Freq: Four times a day (QID) | ORAL | Status: DC | PRN

## 2018-02-23 NOTE — ED Provider Notes (Signed)
[facility] EMERGENCY DEPARTMENT Provider Note   CSN: [id number] Arrival date & time: 02/23/18  1158     History   Chief Complaint Chief Complaint  Patient presents with  . Abdominal Pain    HPI Shekita Boyden is a 72 y.o. female.  Patient with history of end-stage renal disease on hemodialysis (M,W,F), no previous abdominal surgeries, hypertension -- presents to the emergency department with complaint of abdominal pain.  Pain is described as sharp and generalized.  Pain is worse with movement and palpation.  She has had decreased appetite.  No nausea or vomiting.  She had one loose stool that was nonbloody yesterday.  She makes very little urine.  No chest pain or shortness of breath.  No treatments prior to arrival.  Last dialyzed yesterday. The onset of this condition was acute. The course is constant.       Past Medical History:  Diagnosis Date  . Focal segmental glomerulosclerosis   . Hypertension   . Tobacco abuse     Patient Active Problem List   Diagnosis Date Noted  . Hypophosphatemia 03/17/2017  . Hyperphosphatemia 03/17/2017  . Elevated troponin 03/16/2017  . Hyperlipidemia 03/16/2017  . SOB (shortness of breath) 03/16/2017  . Acute kidney injury superimposed on chronic kidney disease ([place]) 03/15/2017  . Acute renal failure (ARF) ([place]) 03/15/2017  . Focal segmental glomerulosclerosis 03/15/2017  . Acute renal failure superimposed on chronic kidney disease ([place]) 03/15/2017  . Nephrotic syndrome 08/20/2016  . Essential hypertension 08/08/2016  . Dyspnea 08/08/2016  . Tobacco abuse   . AKI (acute kidney injury) ([place])   . Anasarca 08/07/2016    Past Surgical History:  Procedure Laterality Date  . AV FISTULA PLACEMENT Left 03/18/2017   Procedure: Brachiocephalic ARTERIOVENOUS (AV) FISTULA CREATION left arm;  Surgeon: Elam Dutch, MD;  Location: [facility];  Service: Vascular;  Laterality: Left;  . INSERTION OF DIALYSIS CATHETER N/A 03/18/2017   Procedure: INSERTION OF DIALYSIS CATHETER;  Surgeon: Elam Dutch, MD;  Location: [facility];  Service: Vascular;  Laterality: N/A;  . IR GENERIC HISTORICAL  08/15/2016   IR US GUIDE VASC ACCESS RIGHT 08/15/2016 Arne Cleveland, MD [facility]INTER[facility]  . IR GENERIC HISTORICAL  08/15/2016   IR FLUORO GUIDE CV LINE RIGHT 08/15/2016 Arne Cleveland, MD [facility]  . TUBAL LIGATION       OB History   None      Home Medications    Prior to Admission medications   Medication Sig Start Date End Date Taking? Authorizing Provider  metoprolol tartrate (LOPRESSOR) 50 MG tablet Take 50 mg by mouth 2 (two) times daily. 03/05/17  Yes [provider]  multivitamin (RENA-VIT) TABS tablet Take 1 tablet by mouth at bedtime. 08/20/16  Yes Rama, Venetia Maxon, MD  nicotine (NICODERM CQ - DOSED IN MG/24 HOURS) 21 mg/24hr patch Place 1 patch (21 mg total) onto the skin daily. 08/20/16   Rama, Venetia Maxon, MD  predniSONE (DELTASONE) 5 MG tablet Take 5 mg by mouth daily. 03/05/17   [provider]  rosuvastatin (CRESTOR) 20 MG tablet Take 20 mg by mouth at bedtime.  09/11/16   [provider]  tacrolimus (PROGRAF) 5 MG capsule Take 5 mg by mouth 2 (two) times daily. 02/06/17   [provider]  Vitamin D, Ergocalciferol, (DRISDOL) 50000 units CAPS capsule Take 50,000 Units by mouth once a week. 01/16/17   [provider]    Family History Family History  Problem Relation Age of Onset  .  Hypertension Mother   . Diabetes Mother   . Hypertension Father   . Diabetes Father   . Hypertension Sister   . Hypertension Brother     Social History Social History   Tobacco Use  . Smoking status: Former Smoker    Types: Cigarettes    Last attempt to quit: 07/2016    Years since quitting: 1.6  . Smokeless tobacco: Never Used  Substance Use Topics  . Alcohol use: No  . Drug use: No     Allergies   Patient has no known allergies.   Review of Systems Review of Systems    Constitutional: Positive for appetite change. Negative for fever.  HENT: Negative for rhinorrhea and sore throat.   Eyes: Negative for redness.  Respiratory: Negative for cough.   Cardiovascular: Negative for chest pain.  Gastrointestinal: Positive for abdominal pain. Negative for diarrhea, nausea and vomiting.  Genitourinary: Negative for dysuria.  Musculoskeletal: Negative for myalgias.  Skin: Negative for rash.  Neurological: Negative for headaches.     Physical Exam Updated Vital Signs BP 95/64 (BP Location: Right Arm)   Pulse (!) 103   Temp 98.4 F (36.9 C) (Oral)   Resp 18   Ht 5\' 6"  (1.676 m)   Wt 79.4 kg   SpO2 99%   BMI 28.25 kg/m   Physical Exam  Constitutional: She appears well-developed and well-nourished.  HENT:  Head: Normocephalic and atraumatic.  Eyes: Conjunctivae are normal. Right eye exhibits no discharge. Left eye exhibits no discharge.  Neck: Normal range of motion. Neck supple.  Cardiovascular: Normal rate, regular rhythm and normal heart sounds.  Pulmonary/Chest: Effort normal and breath sounds normal.  Abdominal: Soft. There is tenderness.  Mild generalized pain, patient jumps with deep palpation in the right lower quadrant  Neurological: She is alert.  Skin: Skin is warm and dry.  LUE fistula  Psychiatric: She has a normal mood and affect.  Nursing note and vitals reviewed.    ED Treatments / Results  Labs (all labs ordered are listed, but only abnormal results are displayed) Labs Reviewed  CBC WITH DIFFERENTIAL/PLATELET - Abnormal; Notable for the following components:      Result Value   WBC 20.3 (*)    RDW 17.0 (*)    Neutro Abs 18.3 (*)    All other components within normal limits  COMPREHENSIVE METABOLIC PANEL - Abnormal; Notable for the following components:   Potassium 6.0 (*)    Chloride 92 (*)    BUN 39 (*)    Creatinine, Ser 7.79 (*)    Calcium 7.6 (*)    Albumin 2.8 (*)    AST 50 (*)    GFR calc non Af Amer 5 (*)     GFR calc Af Amer 5 (*)    All other components within normal limits  LIPASE, BLOOD  PATHOLOGIST SMEAR REVIEW  I-STAT CG4 LACTIC ACID, ED    EKG EKG Interpretation  Date/Time:  Tuesday February 23 2018 12:15:03 EDT Ventricular Rate:  87 PR Interval:    QRS Duration: 79 QT Interval:  405 QTC Calculation: 488 R Axis:   43 Text Interpretation:  Sinus rhythm Borderline prolonged QT interval Baseline wander in lead(s) V3 No significant change since last tracing Confirmed by Gareth Morgan [phone]) on 02/23/2018 12:42:08 PM Also confirmed by Gareth Morgan [phone]), editor Philomena Doheny [phone])  on 02/23/2018 2:26:45 PM   Radiology Ct Abdomen Pelvis Wo Contrast  Result Date: 02/23/2018 CLINICAL DATA:  Abdominal pain after dialysis.  Nausea and vomiting. EXAM: CT ABDOMEN AND PELVIS WITHOUT CONTRAST TECHNIQUE: Multidetector CT imaging of the abdomen and pelvis was performed following the standard protocol without IV contrast. COMPARISON:  08/07/2016 FINDINGS: Lower chest: Dependent atelectasis bilaterally. Hepatobiliary: 4.5 x 5.2 cm heterogeneous low-density mass is identified in the inferior liver, at the junction of the right and left hepatic lobes. This is right at the gallbladder fossa and contracted gallbladder with known calcified gallstones is seen immediately adjacent. Liver otherwise unremarkable on this noncontrast exam. No intrahepatic or extrahepatic biliary dilation. Pancreas: No focal mass lesion. No dilatation of the main duct. No intraparenchymal cyst. No peripancreatic edema. Spleen: No splenomegaly. No focal mass lesion. Adrenals/Urinary Tract: No adrenal nodule or mass. Right kidney unremarkable. Adjacent nonobstructing 7 mm stones are identified in the interpolar left kidney. Ureters unremarkable. Bladder is decompressed. Stomach/Bowel: Stomach is nondistended. No gastric wall thickening. No evidence of outlet obstruction. Duodenum is normally positioned as is the ligament of  Treitz. No small bowel wall thickening. No small bowel dilatation. The appendix is best seen on coronal and sagittal imaging and has normal features. There is edema/inflammation around the cecum with cecal wall thickening evident (59/2). Right-sided diverticuli are noted in the cecum and ascending colon. Diverticular changes are noted in the left colon without evidence of diverticulitis. Vascular/Lymphatic: There is abdominal aortic atherosclerosis without aneurysm. There is no gastrohepatic or hepatoduodenal ligament lymphadenopathy. No intraperitoneal or retroperitoneal lymphadenopathy. No pelvic sidewall lymphadenopathy. Reproductive: The uterus has normal CT imaging appearance. There is no adnexal mass. Other: No intraperitoneal free fluid. Musculoskeletal: No worrisome lytic or sclerotic osseous abnormality. IMPRESSION: 1. Wall thickening in the cecum with pericecal edema/inflammation on a background of right-sided diverticulosis. Imaging features could be related to right-sided diverticulitis. Infectious/inflammatory right colitis also a consideration and right-sided neoplasm cannot be entirely excluded based on this CT scan. 2. Normal terminal ileum and appendix. 3. 4.5 x 5.2 cm heterogeneous low-density lesion in the inferior liver is new since 08/07/2016. This is immediately contiguous with the gallbladder fossa. Given changes in the right colon, liver abscess would be a consideration. Primary/secondary liver neoplasm could also have this appearance and proximity to the contracted gallbladder raises the question of gallbladder origin. If renal function permits, follow-up CT or MRI with contrast would likely prove helpful to further evaluate. 4.  Aortic Atherosclerois (ICD10-170.0) Electronically Signed   By: Misty Stanley M.D.   On: 02/23/2018 15:26    Procedures Procedures (including critical care time)  Medications Ordered in ED Medications  fentaNYL (SUBLIMAZE) injection 50 mcg (has no  administration in time range)  ondansetron (ZOFRAN) injection 4 mg (has no administration in time range)     Initial Impression / Assessment and Plan / ED Course  I have reviewed the triage vital signs and the nursing notes.  Pertinent labs & imaging results that were available during my care of the patient were reviewed by me and considered in my medical decision making (see chart for details).     Patient seen and examined. Work-up initiated. Medications ordered.   Vital signs reviewed and are as follows: BP 95/64 (BP Location: Right Arm)   Pulse (!) 103   Temp 98.4 F (36.9 C) (Oral)   Resp 18   Ht 5\' 6"  (1.676 m)   Wt 79.4 kg   SpO2 99%   BMI 28.25 kg/m   Dose of Zosyn ordered given abdominal pain + elevated WBC count -- high concern for intraabdominal infection in this clinical scenario. Normal lactic  acid. Patient drinking oral contrast prior to CT.   3:54 PM Patient comfortable. Continues to be well-appearing.   I spoke with Dr. Lorin Mercy who accepts patient for admission.   BP (!) 100/56 (BP Location: Right Arm)   Pulse 86   Temp 98.5 F (36.9 C) (Oral)   Resp 18   Ht 5\' 6"  (1.676 m)   Wt 79.4 kg   SpO2 93%   BMI 28.25 kg/m      Final Clinical Impressions(s) / ED Diagnoses   Final diagnoses:  Diverticulitis  Liver mass  Hyperkalemia  End stage kidney disease ([place])   Admit.   ED Discharge Orders    None       Carlisle Cater, Hershal Coria 02/23/18 1556    Gareth Morgan, MD 02/23/18 [phone]

## 2018-02-23 NOTE — H&P (Signed)
History and Physical    Mary Chapman ZHG:[id number] DOB: 1946-03-12 DOA: 02/23/2018  PCP: [facility] [place] Consultants:  Sherryl Manges in [place] Patient coming from:  Home - lives alone; NOK: Daughter, Cecille Rubin, [place]  Chief Complaint: Abdominal pain  HPI: Mary Chapman is a 72 y.o. female with medical history significant of h/o ESRD on HD due to FSGS; HTN; and tobacco dependence presenting with abdominal pain.   She developed abdominal pain, she usually has a high pain tolerance and she couldn't stand it anymore.  It was painful all night.  The pain started yesterday about noon.  She went to HD and felt ok.  RLQ pain mostly, although the pain is also diffuse.  It came on acutely and worsened.  +nausea and vomiting, starting last night and also this AM.  Last emesis was about 2pm.  Last BM was last evening and was loose; she had a total of 3-4 loose BMs yesterday (but she often has loose stools while on HD).  No fevers, no chills.  Her last colonoscopy was 3-4 years ago at [facility]; it was normal, to her knowledge.   ED Course:  Pain started yesterday and worsened.  WBC 20k.  No fever, normal lactate.  CT with cecal inflammation and diverticular changes - ?R-sided diverticulitis.  Cannot r/o mass.  Also with liver changes - abscess, neoplasm on ddx.  Suggest MRI or CT with contrast.  She was given Zosyn.  Needs admission for intraabdominal infection.  Surgery has not been called.  Review of Systems: As per HPI; otherwise review of systems reviewed and negative.   Ambulatory Status:  Ambulates without assistance  Past Medical History:  Diagnosis Date  . Diverticulitis   . Focal segmental glomerulosclerosis    ESRD on MWF HD  . Hypertension   . Tobacco abuse     Past Surgical History:  Procedure Laterality Date  . AV FISTULA PLACEMENT Left 03/18/2017   Procedure: Brachiocephalic ARTERIOVENOUS (AV) FISTULA CREATION left arm;  Surgeon: Elam Dutch, MD;  Location: [facility];  Service: Vascular;  Laterality: Left;  . INSERTION OF DIALYSIS CATHETER N/A 03/18/2017   Procedure: INSERTION OF DIALYSIS CATHETER;  Surgeon: Elam Dutch, MD;  Location: [facility];  Service: Vascular;  Laterality: N/A;  . IR GENERIC HISTORICAL  08/15/2016   IR US GUIDE VASC ACCESS RIGHT 08/15/2016 Arne Cleveland, MD [facility]  . IR GENERIC HISTORICAL  08/15/2016   IR FLUORO GUIDE CV LINE RIGHT 08/15/2016 Arne Cleveland, MD [facility]  . TUBAL LIGATION      Social History   Socioeconomic History  . Marital status: Widowed    Spouse name: Not on file  . Number of children: Not on file  . Years of education: Not on file  . Highest education level: Not on file  Occupational History  . Occupation: retired  Scientific laboratory technician  . Financial resource strain: Not on file  . Food insecurity:    Worry: Not on file    Inability: Not on file  . Transportation needs:    Medical: Not on file    Non-medical: Not on file  Tobacco Use  . Smoking status: Current Some Day Smoker    Types: Cigarettes  . Smokeless tobacco: Never Used  . Tobacco comment: 3 cigs/day  Substance and Sexual Activity  . Alcohol use: No  . Drug use: No  . Sexual activity: Not Currently    Birth control/protection: Post-menopausal  Lifestyle  .  Physical activity:    Days per week: Not on file    Minutes per session: Not on file  . Stress: Not on file  Relationships  . Social connections:    Talks on phone: Not on file    Gets together: Not on file    Attends religious service: Not on file    Active member of club or organization: Not on file    Attends meetings of clubs or organizations: Not on file    Relationship status: Not on file  . Intimate partner violence:    Fear of current or ex partner: Not on file    Emotionally abused: Not on file    Physically abused: Not on file    Forced sexual activity: Not on file  Other Topics Concern  . Not on file  Social History Narrative  .  Not on file    No Known Allergies  Family History  Problem Relation Age of Onset  . Hypertension Mother   . Diabetes Mother   . Hypertension Father   . Diabetes Father   . Hypertension Sister   . Hypertension Brother     Prior to Admission medications   Medication Sig Start Date End Date Taking? Authorizing Provider  metoprolol tartrate (LOPRESSOR) 50 MG tablet Take 50 mg by mouth 2 (two) times daily. 03/05/17  Yes [provider]  multivitamin (RENA-VIT) TABS tablet Take 1 tablet by mouth at bedtime. 08/20/16  Yes Rama, Venetia Maxon, MD  nicotine (NICODERM CQ - DOSED IN MG/24 HOURS) 21 mg/24hr patch Place 1 patch (21 mg total) onto the skin daily. 08/20/16   Rama, Venetia Maxon, MD  predniSONE (DELTASONE) 5 MG tablet Take 5 mg by mouth daily. 03/05/17   [provider]  rosuvastatin (CRESTOR) 20 MG tablet Take 20 mg by mouth at bedtime.  09/11/16   [provider]  tacrolimus (PROGRAF) 5 MG capsule Take 5 mg by mouth 2 (two) times daily. 02/06/17   [provider]  Vitamin D, Ergocalciferol, (DRISDOL) 50000 units CAPS capsule Take 50,000 Units by mouth once a week. 01/16/17   [provider]    Physical Exam: Vitals:   02/23/18 1600 02/23/18 1630 02/23/18 1638 02/23/18 1808  BP: 106/62 115/63  120/60  Pulse: 93 98 98 (!) 102  Resp: 16 (!) 27 20 18   Temp:    (!) 97.5 F (36.4 C)  TempSrc:    Oral  SpO2: 90% (!) 87% 93% 99%  Weight:      Height:         General:  Appears calm and comfortable and is NAD Eyes:  PERRL, EOMI, normal lids, iris ENT:  grossly normal hearing, lips & tongue, mmm; appropriate dentition Neck:  no LAD, masses or thyromegaly Cardiovascular:  RRR, no m/r/g. No LE edema.  Respiratory:   CTA bilaterally with no wheezes/rales/rhonchi.  Normal respiratory effort. Abdomen:  NABS, marked RLQ TTP with generalized diffuse tenderness Skin:  no rash or induration seen on limited exam Musculoskeletal:  grossly normal tone  BUE/BLE, good ROM, no bony abnormality Psychiatric:  grossly normal mood and affect, speech fluent and appropriate, AOx3 Neurologic:  CN 2-12 grossly intact, moves all extremities in coordinated fashion, sensation intact    Radiological Exams on Admission: Ct Abdomen Pelvis Wo Contrast  Result Date: 02/23/2018 CLINICAL DATA:  Abdominal pain after dialysis.  Nausea and vomiting. EXAM: CT ABDOMEN AND PELVIS WITHOUT CONTRAST TECHNIQUE: Multidetector CT imaging of the abdomen and pelvis was performed following  the standard protocol without IV contrast. COMPARISON:  08/07/2016 FINDINGS: Lower chest: Dependent atelectasis bilaterally. Hepatobiliary: 4.5 x 5.2 cm heterogeneous low-density mass is identified in the inferior liver, at the junction of the right and left hepatic lobes. This is right at the gallbladder fossa and contracted gallbladder with known calcified gallstones is seen immediately adjacent. Liver otherwise unremarkable on this noncontrast exam. No intrahepatic or extrahepatic biliary dilation. Pancreas: No focal mass lesion. No dilatation of the main duct. No intraparenchymal cyst. No peripancreatic edema. Spleen: No splenomegaly. No focal mass lesion. Adrenals/Urinary Tract: No adrenal nodule or mass. Right kidney unremarkable. Adjacent nonobstructing 7 mm stones are identified in the interpolar left kidney. Ureters unremarkable. Bladder is decompressed. Stomach/Bowel: Stomach is nondistended. No gastric wall thickening. No evidence of outlet obstruction. Duodenum is normally positioned as is the ligament of Treitz. No small bowel wall thickening. No small bowel dilatation. The appendix is best seen on coronal and sagittal imaging and has normal features. There is edema/inflammation around the cecum with cecal wall thickening evident (59/2). Right-sided diverticuli are noted in the cecum and ascending colon. Diverticular changes are noted in the left colon without evidence of diverticulitis.  Vascular/Lymphatic: There is abdominal aortic atherosclerosis without aneurysm. There is no gastrohepatic or hepatoduodenal ligament lymphadenopathy. No intraperitoneal or retroperitoneal lymphadenopathy. No pelvic sidewall lymphadenopathy. Reproductive: The uterus has normal CT imaging appearance. There is no adnexal mass. Other: No intraperitoneal free fluid. Musculoskeletal: No worrisome lytic or sclerotic osseous abnormality. IMPRESSION: 1. Wall thickening in the cecum with pericecal edema/inflammation on a background of right-sided diverticulosis. Imaging features could be related to right-sided diverticulitis. Infectious/inflammatory right colitis also a consideration and right-sided neoplasm cannot be entirely excluded based on this CT scan. 2. Normal terminal ileum and appendix. 3. 4.5 x 5.2 cm heterogeneous low-density lesion in the inferior liver is new since 08/07/2016. This is immediately contiguous with the gallbladder fossa. Given changes in the right colon, liver abscess would be a consideration. Primary/secondary liver neoplasm could also have this appearance and proximity to the contracted gallbladder raises the question of gallbladder origin. If renal function permits, follow-up CT or MRI with contrast would likely prove helpful to further evaluate. 4.  Aortic Atherosclerois (ICD10-170.0) Electronically Signed   By: Misty Stanley M.D.   On: 02/23/2018 15:26    EKG: Independently reviewed.  NSR with rate 87; nonspecific ST changes with no evidence of acute ischemia   Labs on Admission: I have personally reviewed the available labs and imaging studies at the time of the admission.  Pertinent labs:   K+ 6.0 BUN 39/Creatinine 7.79/GFR 5 Albumin 2.8 AST 35/ALT 50 Lactate 1.76 WBC 20.3  Assessment/Plan Principal Problem:   Diverticulitis Active Problems:   Tobacco abuse   Essential hypertension   Focal segmental glomerulosclerosis   Hyperlipidemia   ESRD on dialysis ([place])     Intraabdominal infection - ?Diverticulitis -Patient developed acute onset of RLQ pain yesterday with n/v, no fever, +leukocytosis -Her imaging may be c/w diverticulitis, although neoplasm is also a consideration -She also has liver involvement which could be abscess or could be neoplasm -Her CT was performed without contrast; will repeat CT with IV contrast (she has ESRD and will be dialyzed tomorrow) -She is having significant pain; will place on a reduced-dose fentanyl PCA given her h/o ESRD -Strict NPO for now -Nausea control with Zofran -Will treat with Zosyn for intraabdominal infection -If not improving, she may need GI/surgery consultation -I discussed the patient with Dr. Rosendo Gros and he is  in agreement with this plan; surgery will not plan to consult on the patient at this time but is happy to be consulted pending results of the CT -She reports having had a recent (3-4ya) colonoscopy at [facility]; will request records  ESRD on HD, due to FSGS -Patient on chronic MWF HD -Nephrology prn order set utilized -She does not appear to be volume overloaded or otherwise in need of acute HD -She will need HD tomorrow -Dr. Justin Mend was informed of the admission and will add her to the list tomorrow  HTN -Continue Lopressor  Tobacco abuse -Encourage cessation.  This was discussed with the patient and should be reviewed on an ongoing basis.  -Patch ordered at patient request.   DVT prophylaxis: SCDs Code Status:  Full - confirmed with patient/family Family Communication: Daughter was present throughout evaluation  Disposition Plan:  Home once clinically improved Consults called: Surgery by telephone only; Nephrology  Admission status: Admit - It is my clinical opinion that admission to INPATIENT is reasonable and necessary because of the expectation that this patient will require hospital care that crosses at least 2 midnights to treat this condition based on the medical  complexity of the problems presented.  Given the aforementioned information, the predictability of an adverse outcome is felt to be significant.    Karmen Bongo MD [facility]  If note is complete, please contact covering daytime or nighttime physician. [email or web address] Password [email or web address]  02/23/2018, 7:06 PM

## 2018-02-23 NOTE — Progress Notes (Signed)
Called by [facility] for transfer of this patient.  She is a 72yo female with h/o ESRD on HD due to FSGS; HTN; and tobacco dependence presenting with abdominal pain.  Pain started yesterday and worsened.  WBC 20k.  No fever, normal lactate.  CT with cecal inflammation and diverticular changes - ?R-sided diverticulitis.  Cannot r/o mass.  Also with liver changes - abscess, neoplasm on ddx.  Suggest MRI or CT with contrast.  She was given Zosyn.  Needs admission for intraabdominal infection.  Surgery has not been called.  Will admit to Med Surg.  Carlyon Shadow, M.D.

## 2018-02-23 NOTE — ED Triage Notes (Signed)
Abdominal pain since yesterday. Pain is sharp and feels like indigestion. Pain started during dialysis.

## 2018-02-23 NOTE — ED Notes (Signed)
Called [facility] spoke to [place] about hospitalist at [facility]

## 2018-02-23 NOTE — ED Notes (Signed)
SpO2 noted to drop to 87% on R/A. Pt placed on 2L O2 via N/C

## 2018-02-23 NOTE — ED Notes (Signed)
ED Provider at bedside. 

## 2018-02-23 NOTE — Progress Notes (Addendum)
New Admission Note:   Arrival Method: [facility]  Mental Orientation: Alert and oriented  Telemetry: Box 12  Assessment: Completed Skin: Intact IV: Right forearm zosyn running  Pain: 5/10  Tubes: None Safety Measures: Safety Fall Prevention Plan has been discussed  Admission: transfer from [facility])  [facility] [place] Orientation: Patient has been orientated to the room, unit and staff.   Family: none   Orders to be reviewed and implemented. Will continue to monitor the patient. Call light has been placed within reach and bed alarm has been activated.   Dillon Bjork, RN [facility] [place]  Phone: [phone]

## 2018-02-23 NOTE — Progress Notes (Signed)
Pharmacy Antibiotic Note  Mary Chapman is a 72 y.o. female admitted on 02/23/2018 with Intra abdominal infection.  Pharmacy has been consulted for zosyn dosing.  ESRD (MWF).  Plan: Zosyn 3.375g IV every 12h (4h infusion)  F/u infectious workup, clinical progression and HD schedule  Height: 5\' 6"  (167.6 cm) Weight: 175 lb 0.7 oz (79.4 kg) IBW/kg (Calculated) : 59.3  Temp (24hrs), Avg:98.4 F (36.9 C), Min:98.4 F (36.9 C), Max:98.4 F (36.9 C)  Recent Labs  Lab 02/23/18 1224  WBC 20.3*  CREATININE 7.79*    Estimated Creatinine Clearance: 7 mL/min (A) (by C-G formula based on SCr of 7.79 mg/dL (H)).    No Known Allergies  Antimicrobials this admission: Zosyn 10/8>>  Bertis Ruddy, PharmD Clinical Pharmacist Please check AMION for all [place] numbers 02/23/2018 1:06 PM

## 2018-02-24 ENCOUNTER — Inpatient Hospital Stay (HOSPITAL_COMMUNITY): Payer: Medicare Other

## 2018-02-24 ENCOUNTER — Encounter (HOSPITAL_COMMUNITY): Payer: Self-pay | Admitting: Nephrology

## 2018-02-24 DIAGNOSIS — R933 Abnormal findings on diagnostic imaging of other parts of digestive tract: Secondary | ICD-10-CM

## 2018-02-24 DIAGNOSIS — N186 End stage renal disease: Secondary | ICD-10-CM

## 2018-02-24 DIAGNOSIS — K921 Melena: Secondary | ICD-10-CM

## 2018-02-24 DIAGNOSIS — R932 Abnormal findings on diagnostic imaging of liver and biliary tract: Secondary | ICD-10-CM

## 2018-02-24 DIAGNOSIS — Z992 Dependence on renal dialysis: Secondary | ICD-10-CM

## 2018-02-24 DIAGNOSIS — I10 Essential (primary) hypertension: Secondary | ICD-10-CM

## 2018-02-24 LAB — CBC
HCT: 36.1 % (ref 36.0–46.0)
HCT: 33.2 % — ABNORMAL LOW (ref 36.0–46.0)
Hemoglobin: 11.7 g/dL — ABNORMAL LOW (ref 12.0–15.0)
Hemoglobin: 9.9 g/dL — ABNORMAL LOW (ref 12.0–15.0)
MCH: 31.2 pg (ref 26.0–34.0)
MCH: 30 pg (ref 26.0–34.0)
MCHC: 32.4 g/dL (ref 30.0–36.0)
MCHC: 29.8 g/dL — AB (ref 30.0–36.0)
MCV: 96.3 fL (ref 80.0–100.0)
MCV: 100.6 fL — ABNORMAL HIGH (ref 80.0–100.0)
NRBC: 0 % (ref 0.0–0.2)
PLATELETS: 171 10*3/uL (ref 150–400)
PLATELETS: 162 10*3/uL (ref 150–400)
RBC: 3.75 MIL/uL — AB (ref 3.87–5.11)
RBC: 3.3 MIL/uL — AB (ref 3.87–5.11)
RDW: 17.1 % — AB (ref 11.5–15.5)
RDW: 17.4 % — ABNORMAL HIGH (ref 11.5–15.5)
WBC: 13.4 10*3/uL — ABNORMAL HIGH (ref 4.0–10.5)
WBC: 14.6 10*3/uL — ABNORMAL HIGH (ref 4.0–10.5)
nRBC: 0 % (ref 0.0–0.2)

## 2018-02-24 LAB — GLUCOSE, CAPILLARY
GLUCOSE-CAPILLARY: 47 mg/dL — AB (ref 70–99)
Glucose-Capillary: 114 mg/dL — ABNORMAL HIGH (ref 70–99)
Glucose-Capillary: 110 mg/dL — ABNORMAL HIGH (ref 70–99)
Glucose-Capillary: 95 mg/dL (ref 70–99)

## 2018-02-24 LAB — BASIC METABOLIC PANEL
ANION GAP: 15 (ref 5–15)
ANION GAP: 17 — AB (ref 5–15)
BUN: 53 mg/dL — ABNORMAL HIGH (ref 8–23)
BUN: 65 mg/dL — ABNORMAL HIGH (ref 8–23)
CALCIUM: 6.5 mg/dL — AB (ref 8.9–10.3)
CALCIUM: 6.6 mg/dL — AB (ref 8.9–10.3)
CHLORIDE: 96 mmol/L — AB (ref 98–111)
CO2: 23 mmol/L (ref 22–32)
CO2: 19 mmol/L — AB (ref 22–32)
Chloride: 92 mmol/L — ABNORMAL LOW (ref 98–111)
Creatinine, Ser: 9.07 mg/dL — ABNORMAL HIGH (ref 0.44–1.00)
Creatinine, Ser: 9.48 mg/dL — ABNORMAL HIGH (ref 0.44–1.00)
GFR calc non Af Amer: 4 mL/min — ABNORMAL LOW (ref >60)
GFR, EST AFRICAN AMERICAN: 4 mL/min — AB (ref >60)
GFR, EST AFRICAN AMERICAN: 4 mL/min — AB (ref >60)
GFR, EST NON AFRICAN AMERICAN: 4 mL/min — AB (ref >60)
GLUCOSE: 50 mg/dL — AB (ref 70–99)
Glucose, Bld: 96 mg/dL (ref 70–99)
Potassium: 6.4 mmol/L (ref 3.5–5.1)
Potassium: 6.3 mmol/L (ref 3.5–5.1)
Sodium: 130 mmol/L — ABNORMAL LOW (ref 135–145)
Sodium: 132 mmol/L — ABNORMAL LOW (ref 135–145)

## 2018-02-24 LAB — HEMOGLOBIN AND HEMATOCRIT, BLOOD
HEMATOCRIT: 27.3 % — AB (ref 36.0–46.0)
Hemoglobin: 8.9 g/dL — ABNORMAL LOW (ref 12.0–15.0)

## 2018-02-24 LAB — ABO/RH: ABO/RH(D): O POS

## 2018-02-24 LAB — MRSA PCR SCREENING: MRSA BY PCR: NEGATIVE

## 2018-02-24 MED ORDER — CHLORHEXIDINE GLUCONATE CLOTH 2 % EX PADS
6.0000 | MEDICATED_PAD | Freq: Every day | CUTANEOUS | Status: DC
  Administered 2018-02-24 – 2018-02-26 (×3): 6 via TOPICAL

## 2018-02-24 MED ORDER — SODIUM CHLORIDE 0.9 % IV SOLN: 100.0000 mL | INTRAVENOUS | Status: DC | PRN

## 2018-02-24 MED ORDER — TECHNETIUM TC 99M-LABELED RED BLOOD CELLS IV KIT
25.0000 | PACK | Freq: Once | INTRAVENOUS | Status: AC | PRN
  Administered 2018-02-24: 25 via INTRAVENOUS

## 2018-02-24 MED ORDER — PENTAFLUOROPROP-TETRAFLUOROETH EX AERO: 1.0000 "application " | INHALATION_SPRAY | CUTANEOUS | Status: DC | PRN

## 2018-02-24 MED ORDER — DEXTROSE 50 % IV SOLN
INTRAVENOUS | Status: AC
  Administered 2018-02-24: 12:00:00
  Filled 2018-02-24: qty 50

## 2018-02-24 MED ORDER — HYDROMORPHONE HCL 1 MG/ML IJ SOLN
1.0000 mg | INTRAMUSCULAR | Status: DC | PRN
  Administered 2018-02-24 – 2018-03-06 (×21): 1 mg via INTRAVENOUS
  Filled 2018-02-24 (×23): qty 1

## 2018-02-24 MED ORDER — SODIUM CHLORIDE 0.9 % IV SOLN: INTRAVENOUS | Status: DC

## 2018-02-24 MED ORDER — DEXTROSE 50 % IV SOLN
1.0000 | Freq: Once | INTRAVENOUS | Status: AC
  Administered 2018-02-24: 50 mL via INTRAVENOUS

## 2018-02-24 MED ORDER — INSULIN ASPART 100 UNIT/ML ~~LOC~~ SOLN: 5.0000 [IU] | Freq: Once | SUBCUTANEOUS | Status: DC

## 2018-02-24 MED ORDER — PANTOPRAZOLE SODIUM 40 MG IV SOLR
40.0000 mg | Freq: Two times a day (BID) | INTRAVENOUS | Status: DC
  Administered 2018-02-24 – 2018-02-26 (×5): 40 mg via INTRAVENOUS
  Filled 2018-02-24 (×8): qty 40

## 2018-02-24 MED ORDER — SODIUM CHLORIDE 0.9 % IV SOLN
1.0000 g | Freq: Once | INTRAVENOUS | Status: AC
  Administered 2018-02-24: 1 g via INTRAVENOUS
  Filled 2018-02-24: qty 10

## 2018-02-24 MED ORDER — ALBUMIN HUMAN 25 % IV SOLN
INTRAVENOUS | Status: AC
  Administered 2018-02-24: 25 g via INTRAVENOUS
  Filled 2018-02-24: qty 100

## 2018-02-24 MED ORDER — DEXTROSE-NACL 5-0.9 % IV SOLN
INTRAVENOUS | Status: DC
  Administered 2018-02-24 – 2018-02-26 (×4): via INTRAVENOUS

## 2018-02-24 MED ORDER — LIDOCAINE-PRILOCAINE 2.5-2.5 % EX CREA
1.0000 "application " | TOPICAL_CREAM | CUTANEOUS | Status: DC | PRN
  Filled 2018-02-24: qty 5

## 2018-02-24 MED ORDER — ALBUMIN HUMAN 25 % IV SOLN
25.0000 g | Freq: Once | INTRAVENOUS | Status: AC
  Administered 2018-02-24: 25 g via INTRAVENOUS

## 2018-02-24 MED ORDER — SODIUM CHLORIDE 0.9 % IV BOLUS
500.0000 mL | Freq: Once | INTRAVENOUS | Status: AC
  Administered 2018-02-24: 500 mL via INTRAVENOUS

## 2018-02-24 MED ORDER — INSULIN ASPART 100 UNIT/ML ~~LOC~~ SOLN: 0.0000 [IU] | SUBCUTANEOUS | Status: DC

## 2018-02-24 NOTE — Consult Note (Signed)
[facility] Consult: 11:48 AM 02/24/2018  LOS: 1 day    Referring Provider: Dr Rhetta Mura  Primary Care Physician:  [facility] [place] Primary Gastroenterologist:  [facility].     Reason for Consultation:  Dark bloody stool,   HPI: Mary Chapman is a 72 y.o. female.  PMH ESRD on MWF HD since 08/2016 .  HTN.  No problems per pt on past colonoscopy.   CT scan of 07/2016 with gallstones. Kidney stone.  Colon diverticulosis.  FF in pelvis and mild subQ edema.    Admitted with acute diverticulitis.  2 days right abd pain, N/V, diarrhea.  No chills, fevers.  Patient says the stools were dark, soft to liquid.  The emesis was also dark. WBCs 20K.  AST/ALT 50/36.  Normal alk phos ant T bili.  Albumin low at 2.8.   K 6.4.   CT scan showed  1. Wall thickening in the cecum with pericecal edema/inflammation on a background of right-sided diverticulosis. Imaging features could be related to right-sided diverticulitis. Infectious/inflammatory right colitis also a consideration and right-sided neoplasm cannot be entirely excluded based on this CT scan. 2. Normal terminal ileum and appendix. 3. 4.5 x 5.2 cm heterogeneous low-density lesion in the inferior liver is new since 08/07/2016. This is immediately contiguous with the gallbladder fossa. Given changes in the right colon, liver abscess would be a consideration. Primary/secondary liver neoplasm could also have this appearance and proximity to the contracted gallbladder raises the question of gallbladder origin. If renal function permits, follow-up CT or MRI with contrast would likely prove helpful to further evaluate. 4.  Aortic Atherosclerosis  Day 2 Zosyn.   Surgery and IR consulted, ? If liver abscesses can be drained.    ~ 11:30 AM today: passed  large, black/bloody stool and was hypotensive to 75/55.  HR in 70s.  Transferred to ICU and had 2nd episode of large bleeding looks like blackberry/currant jelly.   No emesis.   Hgb 13 yest >> 11.7 at 0520 >> 9.9 at 1140.   Has not received Lovenox etc.  No "thinners", ASA, at home.   Occasionally might use 200 mg of ibuprofen as needed, at most twice a month.  Past Medical History:  Diagnosis Date  . Diverticulitis   . Focal segmental glomerulosclerosis    ESRD on MWF HD  . Hypertension   . Tobacco abuse     Past Surgical History:  Procedure Laterality Date  . AV FISTULA PLACEMENT Left 03/18/2017   Procedure: Brachiocephalic ARTERIOVENOUS (AV) FISTULA CREATION left arm;  Surgeon: Elam Dutch, MD;  Location: [place];  Service: Vascular;  Laterality: Left;  . INSERTION OF DIALYSIS CATHETER N/A 03/18/2017   Procedure: INSERTION OF DIALYSIS CATHETER;  Surgeon: Elam Dutch, MD;  Location: [place];  Service: Vascular;  Laterality: N/A;  . IR GENERIC HISTORICAL  08/15/2016   IR US GUIDE VASC ACCESS RIGHT 08/15/2016 Arne Cleveland, MD [facility]  . IR GENERIC HISTORICAL  08/15/2016   IR FLUORO GUIDE CV LINE RIGHT 08/15/2016 Arne Cleveland, MD  [facility]  . TUBAL LIGATION      Prior to Admission medications   Medication Sig Start Date End Date Taking? Authorizing Provider  metoprolol tartrate (LOPRESSOR) 50 MG tablet Take 50 mg by mouth 2 (two) times daily. 03/05/17  Yes [provider]  multivitamin (RENA-VIT) TABS tablet Take 1 tablet by mouth at bedtime. 08/20/16  Yes Rama, Venetia Maxon, MD  nicotine (NICODERM CQ - DOSED IN MG/24 HOURS) 21 mg/24hr patch Place 1 patch (21 mg total) onto the skin daily. 08/20/16   Rama, Venetia Maxon, MD  Vitamin D, Ergocalciferol, (DRISDOL) 50000 units CAPS capsule Take 50,000 Units by mouth once a week. 01/16/17   [provider]    Scheduled Meds: . Chlorhexidine Gluconate Cloth  6 each Topical Q0600  . fentaNYL   Intravenous Q4H   . mouth rinse  15 mL Mouth Rinse BID  . multivitamin  1 tablet Oral QHS  . nicotine  14 mg Transdermal Q2000  . pantoprazole (PROTONIX) IV  40 mg Intravenous Q12H   Infusions: . sodium chloride    . piperacillin-tazobactam (ZOSYN)  IV 3.375 g (02/23/18 2150)  . sodium chloride 500 mL (02/24/18 1126)   PRN Meds: acetaminophen **OR** acetaminophen, calcium carbonate (dosed in mg elemental calcium), camphor-menthol **AND** hydrOXYzine, diphenhydrAMINE **OR** diphenhydrAMINE, docusate sodium, feeding supplement (NEPRO CARB STEADY), naloxone **AND** sodium chloride flush, ondansetron (ZOFRAN) IV, sorbitol, zolpidem   Allergies as of 02/23/2018  . (No Known Allergies)    Family History  Problem Relation Age of Onset  . Hypertension Mother   . Diabetes Mother   . Hypertension Father   . Diabetes Father   . Hypertension Sister   . Hypertension Brother     Social History   Socioeconomic History  . Marital status: Widowed    Spouse name: Not on file  . Number of children: Not on file  . Years of education: Not on file  . Highest education level: Not on file  Occupational History  . Occupation: retired  Scientific laboratory technician  . Financial resource strain: Not on file  . Food insecurity:    Worry: Not on file    Inability: Not on file  . Transportation needs:    Medical: Not on file    Non-medical: Not on file  Tobacco Use  . Smoking status: Current Some Day Smoker    Types: Cigarettes  . Smokeless tobacco: Never Used  . Tobacco comment: 3 cigs/day  Substance and Sexual Activity  . Alcohol use: No  . Drug use: No  . Sexual activity: Not Currently    Birth control/protection: Post-menopausal  Lifestyle  . Physical activity:    Days per week: Not on file    Minutes per session: Not on file  . Stress: Not on file  Relationships  . Social connections:    Talks on phone: Not on file    Gets together: Not on file    Attends religious service: Not on file    Active member of  club or organization: Not on file    Attends meetings of clubs or organizations: Not on file    Relationship status: Not on file  . Intimate partner violence:    Fear of current or ex partner: Not on file    Emotionally abused: Not on file    Physically abused: Not on file    Forced sexual activity: Not on file  Other Topics Concern  . Not on file  Social History Narrative  . Not  on file    REVIEW OF SYSTEMS: Constitutional: Normally patient is highly functional.  She is able to walk without any difficulty.  She drives herself to dialysis. ENT:  No nose bleeds Pulm: No trouble breathing or cough. CV:  No palpitations, no LE edema.  No chest pain. GU:  No hematuria, no frequency GI:  Per HPI.  Patient has never had GI bleeding before.  She does not get indigestion or heartburn.  He has a good appetite and eats well. Heme: Eyes history of unusual bleeding or bruising.  No history of needing iron supplements. Transfusions: None in the past. Neuro:  No headaches, no peripheral tingling or numbness.   No syncope. Derm:  No itching, no rash or sores.  Endocrine:  No sweats or chills.  No polyuria or dysuria Immunization: Not queried Travel:  None beyond local counties in last few months.    PHYSICAL EXAM: Vital signs in last 24 hours: Vitals:   02/24/18 1131 02/24/18 1147  BP: (!) 84/60 (!) 88/60  Pulse: 85 88  Resp:  14  Temp: 98.5 F (36.9 C)   SpO2: 98% 100%   Wt Readings from Last 3 Encounters:  02/23/18 79.4 kg  04/30/17 79.4 kg  03/21/17 91.3 kg    General: Patient looks a bit tired and slightly ill but overall looks in good health.  She is sitting comfortably in bed.  Alert and providing good history. Head: No facial asymmetry or swelling.  No signs of head trauma. Eyes: Slight conjunctival pallor.  No scleral icterus.  EOMI Ears: Not hard of hearing. Nose: No congestion or discharge. Mouth: Moist, pink, clear oral mucosa.  Tongue midline.  Good dentition. Neck:  No JVD, no masses, no thyromegaly. Lungs: Clear bilaterally.  No labored breathing or cough. Heart: RRR.  No MRG.  S1, S2 present Abdomen: Soft.  Normal but hypoactive bowel sounds.  No distention.  Tenderness along the entire right abdomen.  No guarding or rebound..   Rectal: Did not perform DRE but stained deep red blood with clots seen on discarded bed sheets. Musc/Skeltl: No joint redness, swelling, significant contractures or deformities. Extremities: No CCE.  Feet are warm and well-perfused. Neurologic: Alert.  Oriented x3.  Moves all 4 limbs without tremor.  Limb strength not tested. Skin: No rashes, no sores or suspicious lesions. Tattoos: None observed Nodes: No cervical adenopathy. Psych: Operative, pleasant, fluid speech.  Intake/Output from previous day: 10/08 0701 - 10/09 0700 In: 73.9 [I.V.:9.2; IV Piggyback:64.7] Out: 0  Intake/Output this shift: No intake/output data recorded.  LAB RESULTS: Recent Labs    02/23/18 1224 02/24/18 0521  WBC 20.3* 13.4*  HGB 13.0 11.7*  HCT 39.2 36.1  PLT 201 171   BMET Lab Results  Component Value Date   NA 130 (L) 02/24/2018   NA 135 02/23/2018   NA 134 (L) 03/21/2017   K 6.4 (HH) 02/24/2018   K 6.0 (H) 02/23/2018   K 3.3 (L) 03/21/2017   CL 92 (L) 02/24/2018   CL 92 (L) 02/23/2018   CL 101 03/21/2017   CO2 23 02/24/2018   CO2 28 02/23/2018   CO2 25 03/21/2017   GLUCOSE 50 (L) 02/24/2018   GLUCOSE 95 02/23/2018   GLUCOSE 125 (H) 03/21/2017   BUN 53 (H) 02/24/2018   BUN 39 (H) 02/23/2018   BUN 22 (H) 03/21/2017   CREATININE 9.07 (H) 02/24/2018   CREATININE 7.79 (H) 02/23/2018   CREATININE 3.18 (H) 03/21/2017   CALCIUM 6.5 (L)  02/24/2018   CALCIUM 7.6 (L) 02/23/2018   CALCIUM 6.7 (L) 03/21/2017   LFT Recent Labs    02/23/18 1224  PROT 7.0  ALBUMIN 2.8*  AST 50*  ALT 36  ALKPHOS 110  BILITOT 0.5   PT/INR Lab Results  Component Value Date   INR 0.97 03/18/2017   INR 1.04 09/23/2016   INR 1.07  08/08/2016   Hepatitis Panel No results for input(s): HEPBSAG, HCVAB, HEPAIGM, HEPBIGM in the last 72 hours. C-Diff No components found for: CDIFF Lipase     Component Value Date/Time   LIPASE 35 02/23/2018 1224    Drugs of Abuse     Component Value Date/Time   LABOPIA NONE DETECTED 08/08/2016 1128   COCAINSCRNUR NONE DETECTED 08/08/2016 1128   LABBENZ NONE DETECTED 08/08/2016 1128   AMPHETMU NONE DETECTED 08/08/2016 1128   THCU NONE DETECTED 08/08/2016 1128   LABBARB NONE DETECTED 08/08/2016 1128     RADIOLOGY STUDIES: Ct Abdomen Pelvis Wo Contrast  Result Date: 02/23/2018 CLINICAL DATA:  Abdominal pain after dialysis.  Nausea and vomiting. EXAM: CT ABDOMEN AND PELVIS WITHOUT CONTRAST TECHNIQUE: Multidetector CT imaging of the abdomen and pelvis was performed following the standard protocol without IV contrast. COMPARISON:  08/07/2016 FINDINGS: Lower chest: Dependent atelectasis bilaterally. Hepatobiliary: 4.5 x 5.2 cm heterogeneous low-density mass is identified in the inferior liver, at the junction of the right and left hepatic lobes. This is right at the gallbladder fossa and contracted gallbladder with known calcified gallstones is seen immediately adjacent. Liver otherwise unremarkable on this noncontrast exam. No intrahepatic or extrahepatic biliary dilation. Pancreas: No focal mass lesion. No dilatation of the main duct. No intraparenchymal cyst. No peripancreatic edema. Spleen: No splenomegaly. No focal mass lesion. Adrenals/Urinary Tract: No adrenal nodule or mass. Right kidney unremarkable. Adjacent nonobstructing 7 mm stones are identified in the interpolar left kidney. Ureters unremarkable. Bladder is decompressed. Stomach/Bowel: Stomach is nondistended. No gastric wall thickening. No evidence of outlet obstruction. Duodenum is normally positioned as is the ligament of Treitz. No small bowel wall thickening. No small bowel dilatation. The appendix is best seen on coronal and  sagittal imaging and has normal features. There is edema/inflammation around the cecum with cecal wall thickening evident (59/2). Right-sided diverticuli are noted in the cecum and ascending colon. Diverticular changes are noted in the left colon without evidence of diverticulitis. Vascular/Lymphatic: There is abdominal aortic atherosclerosis without aneurysm. There is no gastrohepatic or hepatoduodenal ligament lymphadenopathy. No intraperitoneal or retroperitoneal lymphadenopathy. No pelvic sidewall lymphadenopathy. Reproductive: The uterus has normal CT imaging appearance. There is no adnexal mass. Other: No intraperitoneal free fluid. Musculoskeletal: No worrisome lytic or sclerotic osseous abnormality. IMPRESSION: 1. Wall thickening in the cecum with pericecal edema/inflammation on a background of right-sided diverticulosis. Imaging features could be related to right-sided diverticulitis. Infectious/inflammatory right colitis also a consideration and right-sided neoplasm cannot be entirely excluded based on this CT scan. 2. Normal terminal ileum and appendix. 3. 4.5 x 5.2 cm heterogeneous low-density lesion in the inferior liver is new since 08/07/2016. This is immediately contiguous with the gallbladder fossa. Given changes in the right colon, liver abscess would be a consideration. Primary/secondary liver neoplasm could also have this appearance and proximity to the contracted gallbladder raises the question of gallbladder origin. If renal function permits, follow-up CT or MRI with contrast would likely prove helpful to further evaluate. 4.  Aortic Atherosclerois (ICD10-170.0) Electronically Signed   By: Misty Stanley M.D.   On: 02/23/2018 15:26   Ct Abdomen  Pelvis W Contrast  Result Date: 02/23/2018 CLINICAL DATA:  72 year old female with history of abdominal pain and nausea and vomiting since yesterday. EXAM: CT ABDOMEN AND PELVIS WITH CONTRAST TECHNIQUE: Multidetector CT imaging of the abdomen and  pelvis was performed using the standard protocol following bolus administration of intravenous contrast. CONTRAST:  146m OMNIPAQUE IOHEXOL 300 MG/ML  SOLN COMPARISON:  Noncontrast CT the abdomen and pelvis 02/23/2018. FINDINGS: Lower chest: Dependent areas of scarring and/or atelectasis in the lower lobes of the lungs bilaterally. Hepatobiliary: As noted on the prior study there is a large intermediate attenuation lesion in the central aspect of the liver adjacent to the gallbladder fossa. This is irregular in shape and therefore difficult to accurately measure, but this measures at least 5.2 x 4.4 cm (axial image 32 of series 3) predominantly involving segments 4B and 5. This lesion appears to have some thick internal septations. There is a smaller satellite lesion in the central aspect of segment 5 measuring 1.8 cm (axial image 34 of series 3). In addition, inferior to the lesion there are some dilated bile ducts, likely related to mass effect from the lesion centrally causing mild obstruction. In the posterior aspect of segment 6 of the liver (axial image 33 of series 3) there is an additional 11 mm lesion which is intermediate attenuation. Calcified gallstones in the gallbladder which appears nearly completely contracted. Pancreas: No pancreatic mass. No pancreatic ductal dilatation. No pancreatic or peripancreatic fluid or inflammatory changes. Spleen: Unremarkable. Adrenals/Urinary Tract: 2 nonobstructive calculi are noted in the collecting system of left kidney measuring up to 8 mm. Right kidney and bilateral adrenal glands are normal in appearance. No hydroureteronephrosis. Urinary bladder is normal in appearance. Stomach/Bowel: Normal appearance of the stomach. No pathologic dilatation of small bowel or colon. Numerous colonic diverticulae are noted, most evident in the descending colon. Mural thickening in the region of the cecum, concerning for focal area of colitis, or potentially right-sided  diverticulitis. The possibility of underlying mass is not excluded. Appendix is not confidently identified on today's examination. Vascular/Lymphatic: Aortic atherosclerosis, without evidence of aneurysm or dissection in the abdominal or pelvic vasculature. No lymphadenopathy noted in the abdomen or pelvis. Reproductive: Uterus and ovaries are unremarkable in appearance. Other: No significant volume of ascites.  No pneumoperitoneum. Musculoskeletal: There are no aggressive appearing lytic or blastic lesions noted in the visualized portions of the skeleton. IMPRESSION: 1. Large multi-cystic lesion in the central aspect of the liver adjacent to the gallbladder fossa, with several smaller satellite lesions. These are all new compared to prior CT the chest, abdomen and pelvis 08/07/2016, concerning for intrahepatic abscesses. 2. Extensive mural thickening and inflammatory changes in the region of the cecum. This is nonspecific, and could reflect either right-sided diverticulitis, focal area of colitis, or potentially even underlying neoplasm. 3. Cholelithiasis. Gallbladder is nearly completely contracted without surrounding inflammatory changes to suggest an acute cholecystitis at this time. 4. Left-sided nephrolithiasis measuring up to 8 mm in the upper pole collecting system of left kidney. No ureteral stones or findings of urinary tract obstruction. 5. Aortic atherosclerosis. Electronically Signed   By: DVinnie LangtonM.D.   On: 02/23/2018 21:23    IMPRESSION:   *    GI bleed.   By appearance, suspect lower GI Source.  However she did have dark emesis prior to admission so upper GI with rapid transit is also a possibility. IV BID Protonix initiated.    *    ESRD on HD.  MWF HD.  Parallel rise in BUN/creatinine in last 18 hours.    Hyperkalemia.    *    Cecal diverticulitis vs focal colitis.  Unable to r/o underlying neoplasia.    *     Cystic Liver lesions concerning for abscesses.       PLAN:       *    Will discuss the patient's case with Dr. Tarri Glenn.  *    Consider upper endoscopy to rule out upper GI source of the bleeding. Consider nuclear medicine tagged RBC scan.  If positive, aim would be to then perform CT NGO or direct angiography/embolization.  *    Hospitalist has ordered the IV Protonix  Hgb/hematocrit 6 PM this evening, CBC in the morning.  *    Dr. Melvia Heaps is holding off on hemodialysis for now, ordered a stat recheck of the potassium and will decide as to timing of HD.  *   Sips clears ok.     Azucena Freed  02/24/2018, 11:48 AM Phone [phone]

## 2018-02-24 NOTE — Progress Notes (Signed)
K+ still high, would like to try a short dialysis later tonight after bleeding scan to deal w/ the hyperkalemia. BP's have dropped though and may need pressor support.  Have d/w primary MD.   [name] MD pgr [phone]   02/24/2018, 4:41 PM

## 2018-02-24 NOTE — Consult Note (Addendum)
Renal Service Consult Note [facility]  Mary Chapman 02/24/2018 [place] Requesting Physician:  Dr Wyline Copas  Reason for Consult:  ESRD pt w/ abd pain HPI: The patient is a 72 y.o. year-old w/ 2 day history of R sided abd pain, N/V and diarrhea. Started on Monday, no fevers or chills, no hx of similar pain, gets worse w/ mashing on it or coughing.  In ED the CT scan showed cecal inflammation and diverticular changes, can't r/o mass.  Also liver changes, abscess vs neoplasm. Pt was admitted, started on IV abx. We are asked to see for ESRD.    Pt on HD for about 2 years since 2017, she's not sure of cause of ESRD.  Happened "all of a sudden".  Takes MWF HD, last HD Monday, no problems/ cramping at HD, no fistula problems.  Goes to [employer] point.  No leg swelling, no SOB.    No etoh, occ tobacco, widowed , lives alone.  Has a son in [place] and a daughter in [place].     ROS  denies CP  no joint pain   no HA  no blurry vision  no rash  no diarrhea  no nausea/ vomiting    Past Medical History  Past Medical History:  Diagnosis Date  . Diverticulitis   . Focal segmental glomerulosclerosis    ESRD on MWF HD  . Hypertension   . Tobacco abuse    Past Surgical History  Past Surgical History:  Procedure Laterality Date  . AV FISTULA PLACEMENT Left 03/18/2017   Procedure: Brachiocephalic ARTERIOVENOUS (AV) FISTULA CREATION left arm;  Surgeon: Elam Dutch, MD;  Location: [place];  Service: Vascular;  Laterality: Left;  . INSERTION OF DIALYSIS CATHETER N/A 03/18/2017   Procedure: INSERTION OF DIALYSIS CATHETER;  Surgeon: Elam Dutch, MD;  Location: [place];  Service: Vascular;  Laterality: N/A;  . IR GENERIC HISTORICAL  08/15/2016   IR US GUIDE VASC ACCESS RIGHT 08/15/2016 Arne Cleveland, MD [facility]  . IR GENERIC HISTORICAL  08/15/2016   IR FLUORO GUIDE CV LINE RIGHT 08/15/2016 Arne Cleveland, MD [facility]  . TUBAL LIGATION     Family History   Family History  Problem Relation Age of Onset  . Hypertension Mother   . Diabetes Mother   . Hypertension Father   . Diabetes Father   . Hypertension Sister   . Hypertension Brother    Social History  reports that she has been smoking cigarettes. She has never used smokeless tobacco. She reports that she does not drink alcohol or use drugs. Allergies No Known Allergies Home medications Prior to Admission medications   Medication Sig Start Date End Date Taking? Authorizing Provider  metoprolol tartrate (LOPRESSOR) 50 MG tablet Take 50 mg by mouth 2 (two) times daily. 03/05/17  Yes [provider]  multivitamin (RENA-VIT) TABS tablet Take 1 tablet by mouth at bedtime. 08/20/16  Yes Rama, Venetia Maxon, MD  nicotine (NICODERM CQ - DOSED IN MG/24 HOURS) 21 mg/24hr patch Place 1 patch (21 mg total) onto the skin daily. 08/20/16   Rama, Venetia Maxon, MD  Vitamin D, Ergocalciferol, (DRISDOL) 50000 units CAPS capsule Take 50,000 Units by mouth once a week. 01/16/17   [provider]   Liver Function Tests Recent Labs  Lab 02/23/18 1224  AST 50*  ALT 36  ALKPHOS 110  BILITOT 0.5  PROT 7.0  ALBUMIN 2.8*   Recent Labs  Lab 02/23/18 1224  LIPASE 35  CBC Recent Labs  Lab 02/23/18 1224 02/24/18 0521  WBC 20.3* 13.4*  NEUTROABS 18.3*  --   HGB 13.0 11.7*  HCT 39.2 36.1  MCV 95.1 96.3  PLT 201 251   Basic Metabolic Panel Recent Labs  Lab 02/23/18 1224 02/24/18 0521  NA 135 130*  K 6.0* 6.4*  CL 92* 92*  CO2 28 23  GLUCOSE 95 50*  BUN 39* 53*  CREATININE 7.79* 9.07*  CALCIUM 7.6* 6.5*   Iron/TIBC/Ferritin/ %Sat    Component Value Date/Time   IRON 92 03/15/2017 1331   TIBC 175 (L) 03/15/2017 1331   FERRITIN 206 08/10/2016 0204   IRONPCTSAT 53 (H) 03/15/2017 1331    Vitals:   02/24/18 0017 02/24/18 0403 02/24/18 0546 02/24/18 0755  BP:   109/72 103/62  Pulse:   78 75  Resp: 18 18 16 17   Temp:   98.7 F (37.1 C) 98.6 F (37 C)  TempSrc:    Oral   SpO2: 97% 97% 98% 98%  Weight:      Height:       Exam Gen alert, nasal O2, PCA pump No rash, cyanosis or gangrene Sclera anicteric, throat clear  No jvd or bruits Chest some rhonchi, BS ok, no rales RRR no MRG  Abd tender RLQ, normal BS, no ascites or mass GU defer MS no joint effusions or deformity Ext no edema, no wounds or ulcers Neuro is alert, Ox 3 , nf L arm AVF+bruit    Home meds:  - metoprolol 50 bid  - MVI/ nicotine patch/ vitamins   Dialysis: MWF [facility]  4h  64.7kg  2/2.5 bath   L AVF   Hep 4000 + 2000 midrun  - hect 2 ug  - venofer 50/wk     Impression/ Plan 1. RLQ abd pain/ N/V - per primary, possible diverticulitis vs other 2. Liver mass/ abscess - by imaging 3. ESRD on HD MWF> HD today, K+ up, vol depleted. Will give IVF's x 12 hrs.  4. HTN on metoprolol 5. Smoker 6. Anemia ckd - no esa, Hb 11 7. MBD ckd - cont meds, Ca low, phos pnd   [name] MD [place] pager [phone]   02/24/2018, 9:03 AM

## 2018-02-24 NOTE — Progress Notes (Signed)
Patient tried to get up to go to bathroom. She had a large bowel movement the stool was red brown Patient was diaphoretic .Charged Nurse was called Rapid was called they assess patient . BP 75/55, given NS 500 cc bolus, patient CBG 47, 50 of Dextrose was given. rechecked B/P 88/60.  Dr. Wyline Copas put order to transferred patient to ICU. RN Maudie Mercury was called and report given. Answered all her questions. SWOT nurses transferred the patient to 46M [id number].

## 2018-02-24 NOTE — Consult Note (Signed)
[facility] Surgery Consult/Admission Note  Deshanae Lindo 03/29/1946  [id number].    Requesting MD: Dr. Wyline Copas Chief Complaint/Reason for Consult: diverticulitis  HPI:   Pt is a 72 yo female with a hx of ESRD on HD, HTN, tobacco dependence who presented to the ED with abdominal pain. Pt states pain started Monday around 1230 after HD. Pain is in the RLQ, sharp, constant, severe, non radiating and nothing made it better. She has never had this pain before. Associated nausea and vomiting. No fever, chills, blood in stools, changes in bowels. Pt states she often gets loose stools with HD. Hx of tubal ligation. She states its been a "minute" since her last colonoscopy but she does not believe there were any issues. Done at [facility] in [facility].   CT scan showed large multi-cystic lesions in the central aspect of the liver, extensive mural thickening and inflammatory changes in the region of the cecum, could reflect diverticulitis, colitis, or underlying neoplasm.  Labs: WBC 13.4, K 6.4, Sodium 130, chloride 92, Hg 11.7  ROS:  Review of Systems  Constitutional: Negative for chills, diaphoresis and fever.  HENT: Negative for sore throat.   Respiratory: Negative for cough and shortness of breath.   Cardiovascular: Negative for chest pain.  Gastrointestinal: Positive for abdominal pain, nausea and vomiting. Negative for blood in stool, constipation and diarrhea.  Genitourinary: Negative for dysuria.  Skin: Negative for rash.  Neurological: Negative for dizziness and loss of consciousness.  All other systems reviewed and are negative.    Family History  Problem Relation Age of Onset  . Hypertension Mother   . Diabetes Mother   . Hypertension Father   . Diabetes Father   . Hypertension Sister   . Hypertension Brother     Past Medical History:  Diagnosis Date  . Diverticulitis   . Focal segmental glomerulosclerosis    ESRD on MWF HD  . Hypertension   . Tobacco abuse     Past  Surgical History:  Procedure Laterality Date  . AV FISTULA PLACEMENT Left 03/18/2017   Procedure: Brachiocephalic ARTERIOVENOUS (AV) FISTULA CREATION left arm;  Surgeon: Elam Dutch, MD;  Location: [facility];  Service: Vascular;  Laterality: Left;  . INSERTION OF DIALYSIS CATHETER N/A 03/18/2017   Procedure: INSERTION OF DIALYSIS CATHETER;  Surgeon: Elam Dutch, MD;  Location: [place];  Service: Vascular;  Laterality: N/A;  . IR GENERIC HISTORICAL  08/15/2016   IR US GUIDE VASC ACCESS RIGHT 08/15/2016 Arne Cleveland, MD [facility]  . IR GENERIC HISTORICAL  08/15/2016   IR FLUORO GUIDE CV LINE RIGHT 08/15/2016 Arne Cleveland, MD [facility]  . TUBAL LIGATION      Social History:  reports that she has been smoking cigarettes. She has never used smokeless tobacco. She reports that she does not drink alcohol or use drugs.  Allergies: No Known Allergies  Medications Prior to Admission  Medication Sig Dispense Refill  . metoprolol tartrate (LOPRESSOR) 50 MG tablet Take 50 mg by mouth 2 (two) times daily.  5  . multivitamin (RENA-VIT) TABS tablet Take 1 tablet by mouth at bedtime. 30 tablet 3  . nicotine (NICODERM CQ - DOSED IN MG/24 HOURS) 21 mg/24hr patch Place 1 patch (21 mg total) onto the skin daily. 28 patch 0  . Vitamin D, Ergocalciferol, (DRISDOL) 50000 units CAPS capsule Take 50,000 Units by mouth once a week.  5    Blood pressure 123/77, pulse 76, temperature 98.6 F (37 C), temperature source Oral,  resp. rate 19, height _0  (1.676 m), weight 79.4 kg, SpO2 99 %.  Physical Exam  Constitutional: She is oriented to person, place, and time. She appears well-developed and well-nourished.  Non-toxic appearance. She does not appear ill. No distress.  HENT:  Head: Normocephalic and atraumatic.  Nose: Nose normal.  Mouth/Throat: Oropharynx is clear and moist and mucous membranes are normal. No oropharyngeal exudate.  Eyes: Pupils are equal, round, and reactive to light.  Conjunctivae are normal. Right eye exhibits no discharge. Left eye exhibits no discharge. No scleral icterus.  Neck: Normal range of motion. Neck supple. No thyromegaly present.  Cardiovascular: Normal rate, regular rhythm, normal heart sounds and intact distal pulses.  No murmur heard. Pulses:      Radial pulses are 2+ on the right side, and 2+ on the left side.       Dorsalis pedis pulses are 2+ on the right side, and 2+ on the left side.  Pulmonary/Chest: Effort normal and breath sounds normal. No respiratory distress. She has no wheezes. She has no rhonchi. She has no rales.  Abdominal: Soft. Normal appearance and bowel sounds are normal. She exhibits no distension. There is no hepatosplenomegaly. There is tenderness in the right lower quadrant. There is guarding. There is no rigidity.  Musculoskeletal: Normal range of motion. She exhibits no edema, tenderness or deformity.  Lymphadenopathy:    She has no cervical adenopathy.  Neurological: She is alert and oriented to person, place, and time.  Skin: Skin is warm and dry. No rash noted. She is not diaphoretic.  Psychiatric: She has a normal mood and affect.  Nursing note and vitals reviewed.   Results for orders placed or performed during the hospital encounter of 02/23/18 (from the past 48 hour(s))  CBC with Differential/Platelet     Status: Abnormal   Collection Time: 02/23/18 12:24 PM  Result Value Ref Range   WBC 20.3 (H) 4.0 - 10.5 K/uL   RBC 4.12 3.87 - 5.11 MIL/uL   Hemoglobin 13.0 12.0 - 15.0 g/dL   HCT 39.2 36.0 - 46.0 %   MCV 95.1 80.0 - 100.0 fL   MCH 31.6 26.0 - 34.0 pg   MCHC 33.2 30.0 - 36.0 g/dL   RDW 17.0 (H) 11.5 - 15.5 %   Platelets 201 150 - 400 K/uL   Neutrophils Relative % 90 %   Lymphocytes Relative 5 %   Monocytes Relative 5 %   Eosinophils Relative 0 %   Basophils Relative 0 %   Neutro Abs 18.3 (H) 1.7 - 7.7 K/uL   Lymphs Abs 1.0 0.7 - 4.0 K/uL   Monocytes Absolute 1.0 0.1 - 1.0 K/uL   Eosinophils  Absolute 0.0 0.0 - 0.5 K/uL   Basophils Absolute 0.0 0.0 - 0.1 K/uL   WBC Morphology MILD LEFT SHIFT (1-5% METAS, OCC MYELO, OCC BANDS)     Comment: GIANT PLATELETS SEEN Performed at [facility], [address] [place], [place] [address]   Comprehensive metabolic panel     Status: Abnormal   Collection Time: 02/23/18 12:24 PM  Result Value Ref Range   Sodium 135 135 - 145 mmol/L   Potassium 6.0 (H) 3.5 - 5.1 mmol/L   Chloride 92 (L) 98 - 111 mmol/L   CO2 28 22 - 32 mmol/L   Glucose, Bld 95 70 - 99 mg/dL   BUN 39 (H) 8 - 23 mg/dL   Creatinine, Ser 7.79 (H) 0.44 - 1.00 mg/dL   Calcium  7.6 (L) 8.9 - 10.3 mg/dL   Total Protein 7.0 6.5 - 8.1 g/dL   Albumin 2.8 (L) 3.5 - 5.0 g/dL   AST 50 (H) 15 - 41 U/L   ALT 36 0 - 44 U/L   Alkaline Phosphatase 110 38 - 126 U/L   Total Bilirubin 0.5 0.3 - 1.2 mg/dL   GFR calc non Af Amer 5 (L) >60 mL/min   GFR calc Af Amer 5 (L) >60 mL/min    Comment: (NOTE) The eGFR has been calculated using the CKD EPI equation. This calculation has not been validated in all clinical situations. eGFR's persistently <60 mL/min signify possible Chronic Kidney Disease.    Anion gap 15 5 - 15    Comment: Performed at [facility], [address] [place], [place] [address]  Lipase, blood     Status: None   Collection Time: 02/23/18 12:24 PM  Result Value Ref Range   Lipase 35 11 - 51 U/L    Comment: Performed at [facility], [address] [place], [place] [address]  I-Stat CG4 Lactic Acid, ED     Status: None   Collection Time: 02/23/18  2:15 PM  Result Value Ref Range   Lactic Acid, Venous 1.76 0.5 - 1.9 mmol/L  MRSA PCR Screening     Status: None   Collection Time: 02/24/18 12:19 AM  Result Value Ref Range   MRSA by PCR NEGATIVE NEGATIVE    Comment:        The GeneXpert MRSA Assay (FDA approved for NASAL specimens only), is one component of a comprehensive MRSA colonization surveillance program. It is not intended  to diagnose MRSA infection nor to guide or monitor treatment for MRSA infections. Performed at [facility], [place] [address] [place], [place] [address]   Basic metabolic panel     Status: Abnormal   Collection Time: 02/24/18  5:21 AM  Result Value Ref Range   Sodium 130 (L) 135 - 145 mmol/L   Potassium 6.4 (HH) 3.5 - 5.1 mmol/L    Comment: CRITICAL RESULT CALLED TO, READ BACK BY AND VERIFIED WITH: E CASTRO,RN [id number] WILDERK    Chloride 92 (L) 98 - 111 mmol/L   CO2 23 22 - 32 mmol/L   Glucose, Bld 50 (L) 70 - 99 mg/dL   BUN 53 (H) 8 - 23 mg/dL   Creatinine, Ser 9.07 (H) 0.44 - 1.00 mg/dL   Calcium 6.5 (L) 8.9 - 10.3 mg/dL   GFR calc non Af Amer 4 (L) >60 mL/min   GFR calc Af Amer 4 (L) >60 mL/min    Comment: (NOTE) The eGFR has been calculated using the CKD EPI equation. This calculation has not been validated in all clinical situations. eGFR's persistently <60 mL/min signify possible Chronic Kidney Disease.    Anion gap 15 5 - 15    Comment: Performed at [facility] [address], [place], [place] [address]  CBC     Status: Abnormal   Collection Time: 02/24/18  5:21 AM  Result Value Ref Range   WBC 13.4 (H) 4.0 - 10.5 K/uL   RBC 3.75 (L) 3.87 - 5.11 MIL/uL   Hemoglobin 11.7 (L) 12.0 - 15.0 g/dL   HCT 36.1 36.0 - 46.0 %   MCV 96.3 80.0 - 100.0 fL   MCH 31.2 26.0 - 34.0 pg   MCHC 32.4 30.0 - 36.0 g/dL   RDW 17.1 (H) 11.5 - 15.5 %  Platelets 171 150 - 400 K/uL   nRBC 0.0 0.0 - 0.2 %    Comment: Performed at [facility], [place] [address], [place], [place] [address]   Ct Abdomen Pelvis Wo Contrast  Result Date: 02/23/2018 CLINICAL DATA:  Abdominal pain after dialysis.  Nausea and vomiting. EXAM: CT ABDOMEN AND PELVIS WITHOUT CONTRAST TECHNIQUE: Multidetector CT imaging of the abdomen and pelvis was performed following the standard protocol without IV contrast. COMPARISON:  08/07/2016 FINDINGS: Lower chest: Dependent atelectasis bilaterally.  Hepatobiliary: 4.5 x 5.2 cm heterogeneous low-density mass is identified in the inferior liver, at the junction of the right and left hepatic lobes. This is right at the gallbladder fossa and contracted gallbladder with known calcified gallstones is seen immediately adjacent. Liver otherwise unremarkable on this noncontrast exam. No intrahepatic or extrahepatic biliary dilation. Pancreas: No focal mass lesion. No dilatation of the main duct. No intraparenchymal cyst. No peripancreatic edema. Spleen: No splenomegaly. No focal mass lesion. Adrenals/Urinary Tract: No adrenal nodule or mass. Right kidney unremarkable. Adjacent nonobstructing 7 mm stones are identified in the interpolar left kidney. Ureters unremarkable. Bladder is decompressed. Stomach/Bowel: Stomach is nondistended. No gastric wall thickening. No evidence of outlet obstruction. Duodenum is normally positioned as is the ligament of Treitz. No small bowel wall thickening. No small bowel dilatation. The appendix is best seen on coronal and sagittal imaging and has normal features. There is edema/inflammation around the cecum with cecal wall thickening evident (59/2). Right-sided diverticuli are noted in the cecum and ascending colon. Diverticular changes are noted in the left colon without evidence of diverticulitis. Vascular/Lymphatic: There is abdominal aortic atherosclerosis without aneurysm. There is no gastrohepatic or hepatoduodenal ligament lymphadenopathy. No intraperitoneal or retroperitoneal lymphadenopathy. No pelvic sidewall lymphadenopathy. Reproductive: The uterus has normal CT imaging appearance. There is no adnexal mass. Other: No intraperitoneal free fluid. Musculoskeletal: No worrisome lytic or sclerotic osseous abnormality. IMPRESSION: 1. Wall thickening in the cecum with pericecal edema/inflammation on a background of right-sided diverticulosis. Imaging features could be related to right-sided diverticulitis. Infectious/inflammatory  right colitis also a consideration and right-sided neoplasm cannot be entirely excluded based on this CT scan. 2. Normal terminal ileum and appendix. 3. 4.5 x 5.2 cm heterogeneous low-density lesion in the inferior liver is new since 08/07/2016. This is immediately contiguous with the gallbladder fossa. Given changes in the right colon, liver abscess would be a consideration. Primary/secondary liver neoplasm could also have this appearance and proximity to the contracted gallbladder raises the question of gallbladder origin. If renal function permits, follow-up CT or MRI with contrast would likely prove helpful to further evaluate. 4.  Aortic Atherosclerois (ICD10-170.0) Electronically Signed   By: Misty Stanley M.D.   On: 02/23/2018 15:26   Ct Abdomen Pelvis W Contrast  Result Date: 02/23/2018 CLINICAL DATA:  72 year old female with history of abdominal pain and nausea and vomiting since yesterday. EXAM: CT ABDOMEN AND PELVIS WITH CONTRAST TECHNIQUE: Multidetector CT imaging of the abdomen and pelvis was performed using the standard protocol following bolus administration of intravenous contrast. CONTRAST:  158m OMNIPAQUE IOHEXOL 300 MG/ML  SOLN COMPARISON:  Noncontrast CT the abdomen and pelvis 02/23/2018. FINDINGS: Lower chest: Dependent areas of scarring and/or atelectasis in the lower lobes of the lungs bilaterally. Hepatobiliary: As noted on the prior study there is a large intermediate attenuation lesion in the central aspect of the liver adjacent to the gallbladder fossa. This is irregular in shape and therefore difficult to accurately measure, but this measures at least 5.2 x  4.4 cm (axial image 32 of series 3) predominantly involving segments 4B and 5. This lesion appears to have some thick internal septations. There is a smaller satellite lesion in the central aspect of segment 5 measuring 1.8 cm (axial image 34 of series 3). In addition, inferior to the lesion there are some dilated bile ducts,  likely related to mass effect from the lesion centrally causing mild obstruction. In the posterior aspect of segment 6 of the liver (axial image 33 of series 3) there is an additional 11 mm lesion which is intermediate attenuation. Calcified gallstones in the gallbladder which appears nearly completely contracted. Pancreas: No pancreatic mass. No pancreatic ductal dilatation. No pancreatic or peripancreatic fluid or inflammatory changes. Spleen: Unremarkable. Adrenals/Urinary Tract: 2 nonobstructive calculi are noted in the collecting system of left kidney measuring up to 8 mm. Right kidney and bilateral adrenal glands are normal in appearance. No hydroureteronephrosis. Urinary bladder is normal in appearance. Stomach/Bowel: Normal appearance of the stomach. No pathologic dilatation of small bowel or colon. Numerous colonic diverticulae are noted, most evident in the descending colon. Mural thickening in the region of the cecum, concerning for focal area of colitis, or potentially right-sided diverticulitis. The possibility of underlying mass is not excluded. Appendix is not confidently identified on today's examination. Vascular/Lymphatic: Aortic atherosclerosis, without evidence of aneurysm or dissection in the abdominal or pelvic vasculature. No lymphadenopathy noted in the abdomen or pelvis. Reproductive: Uterus and ovaries are unremarkable in appearance. Other: No significant volume of ascites.  No pneumoperitoneum. Musculoskeletal: There are no aggressive appearing lytic or blastic lesions noted in the visualized portions of the skeleton. IMPRESSION: 1. Large multi-cystic lesion in the central aspect of the liver adjacent to the gallbladder fossa, with several smaller satellite lesions. These are all new compared to prior CT the chest, abdomen and pelvis 08/07/2016, concerning for intrahepatic abscesses. 2. Extensive mural thickening and inflammatory changes in the region of the cecum. This is nonspecific, and  could reflect either right-sided diverticulitis, focal area of colitis, or potentially even underlying neoplasm. 3. Cholelithiasis. Gallbladder is nearly completely contracted without surrounding inflammatory changes to suggest an acute cholecystitis at this time. 4. Left-sided nephrolithiasis measuring up to 8 mm in the upper pole collecting system of left kidney. No ureteral stones or findings of urinary tract obstruction. 5. Aortic atherosclerosis. Electronically Signed   By: Vinnie Langton M.D.   On: 02/23/2018 21:23      Assessment/Plan Principal Problem:   Diverticulitis Active Problems:   Tobacco abuse   Essential hypertension   Focal segmental glomerulosclerosis   Hyperlipidemia   ESRD on dialysis ([place])  Diverticulitis vs colitis vs neoplasm of cecum Liver abscess? - recommend bowel rest, IV abx, NPO excepts chips and sips - rec IR consult for liver abscesses to see if abscesses can be drained  FEN: NPO, sips and chips VTE: SCD's, lovenox okay from surgical standpoint but will defer to medicine ID: Zosyn 10/08>> Foley: none Follow up: TBD  Plan: conservative management for likely colitis/diverticulitis. Thank you for the consult. We will follow.    Kalman Drape, [facility] Surgery 02/24/2018, 10:15 AM Pager: [phone] Consults: [phone] Mon-Fri 7:00 am-4:30 pm Sat-Sun 7:00 am-11:30 am

## 2018-02-24 NOTE — Progress Notes (Signed)
Report called to [place] on 5W

## 2018-02-24 NOTE — Significant Event (Signed)
Rapid Response Event Note RN called regarding large amount bloody of bloody stool and diaphoretic Overview:  On my arrival pt lying supine in bed, alert and oriented x3, skin cool and clammy, SBP 70's, CBG 49. Dr. Wyline Copas and Dr. Jonnie Finner called prior to my arrival. SBP 88 prior to transfer to progressive care. No progressive beds available will admit to ICU     Initial Focused Assessment:   Interventions: 1 L NS bolus started 1 amp D50 Transferred to ICU   Event Summary:   at      at          [facility], Sela Hua

## 2018-02-24 NOTE — Progress Notes (Signed)
Pt transfer via bed, on monitor, with 2L [place].  Handoff at bedside to [place], [place]

## 2018-02-24 NOTE — H&P (View-Only) (Signed)
Pt transfer via bed, on monitor, with 2L [place].  Handoff at bedside to [place], [place]

## 2018-02-24 NOTE — Progress Notes (Signed)
PROGRESS NOTE    Mary Chapman  BJS:[id number] DOB: Jun 28, 1945 DOA: 02/23/2018 PCP: [facility]    Brief Narrative:  72 y.o. female with medical history significant of h/o ESRD on HD due to FSGS; HTN; and tobacco dependence presenting with abdominal pain.   She developed abdominal pain, she usually has a high pain tolerance and she couldn't stand it anymore.  It was painful all night.  The pain started yesterday about noon.  She went to HD and felt ok.  RLQ pain mostly, although the pain is also diffuse.  It came on acutely and worsened.  +nausea and vomiting, starting last night and also this AM.  Last emesis was about 2pm.  Last BM was last evening and was loose; she had a total of 3-4 loose BMs yesterday (but she often has loose stools while on HD).  No fevers, no chills.  Her last colonoscopy was 3-4 years ago at [facility]; it was normal, to her knowledge.   ED Course: Pain started yesterday and worsened. WBC 20k. No fever, normal lactate. CT with cecal inflammation and diverticular changes - ?R-sided diverticulitis. Cannot r/o mass. Also with liver changes - abscess, neoplasm on ddx. Suggest MRI or CT with contrast. She was given Zosyn. Needs admission for intraabdominal infection. Surgery has not been called.  Assessment & Plan:   Principal Problem:   Diverticulitis Active Problems:   Tobacco abuse   Essential hypertension   Focal segmental glomerulosclerosis   Hyperlipidemia   ESRD on dialysis ([place])  Intraabdominal infection - Proximal diverticulitis, Liver abscess -Patient developed acute onset of RLQ pain prior to admit with n/v, no fever, +leukocytosis -CT personally reviewed. Findings suggestive of diverticulitis at level of cecum with findings of 4-5cm fluid collections in liver c/w liver abscess -General Surgery consulted, recs noted -Have consulted IR regarding liver abscess -GI consulted, see below  ESRD on HD, due to FSGS -Patient on  chronic MWF HD -Nephrology consulted -Given concerns of hemodynamic instability, HD currently oh hold. Discussed with Nephrology  HTN -Had been controlled this AM -Later in AM, patient developed acute blood loss anemia, suspected UGI bleed with resultant drop in SBP to 70's -Metoprolol d/c'd -Given IVF hydration  Acute blood loss anemia -New finding -Large dark stool this AM with resultant hemodynamic instability noted -Started on IVF hydration -Will order stat CBC and type, screen -Ordered IV protonix -Have consulted GI for assistance. Keep NPO -Will transfer to SDU for closer monitoring  Tobacco abuse -Encourage cessation.  This was discussed with the patient and should be reviewed on an ongoing basis.  DVT prophylaxis: SCD Code Status: Full Family Communication: Pt in room, family not at bedside Disposition Plan: Uncertain at this time  Consultants:   GI  General Surgery  IR  Procedures:     Antimicrobials: Anti-infectives (From admission, onward)   Start     Dose/Rate Route Frequency Ordered Stop   02/23/18 1319  piperacillin-tazobactam (ZOSYN) IVPB 3.375 g     3.375 g 12.5 mL/hr over 240 Minutes Intravenous Every 12 hours 02/23/18 1319         Subjective: Complains of abd pain  Objective: Vitals:   02/24/18 0935 02/24/18 1117 02/24/18 1131 02/24/18 1147  BP: 123/77 (!) 75/55 (!) 84/60 (!) 88/60  Pulse: 76 88 85 88  Resp: 19   14  Temp:  98.5 F (36.9 C) 98.5 F (36.9 C)   TempSrc:   Oral   SpO2: 99% 90% 98% 100%  Weight:  Height:        Intake/Output Summary (Last 24 hours) at 02/24/2018 1154 Last data filed at 02/24/2018 0900 Gross per 24 hour  Intake 73.87 ml  Output 0 ml  Net 73.87 ml   Filed Weights   02/23/18 1203  Weight: 79.4 kg    Examination:  General exam: Appears calm, occasionally grimaces to pain Respiratory system: Clear to auscultation. Respiratory effort normal. Cardiovascular system: S1 & S2 heard,  RRR Gastrointestinal system: Generally tender, pos BS, nondistended Central nervous system: Alert and oriented. No focal neurological deficits. Extremities: Symmetric 5 x 5 power. Skin: No rashes, lesions  Psychiatry: Judgement and insight appear normal. Mood & affect appropriate.   Data Reviewed: I have personally reviewed following labs and imaging studies  CBC: Recent Labs  Lab 02/23/18 1224 02/24/18 0521  WBC 20.3* 13.4*  NEUTROABS 18.3*  --   HGB 13.0 11.7*  HCT 39.2 36.1  MCV 95.1 96.3  PLT 201 297   Basic Metabolic Panel: Recent Labs  Lab 02/23/18 1224 02/24/18 0521  NA 135 130*  K 6.0* 6.4*  CL 92* 92*  CO2 28 23  GLUCOSE 95 50*  BUN 39* 53*  CREATININE 7.79* 9.07*  CALCIUM 7.6* 6.5*   GFR: Estimated Creatinine Clearance: 6 mL/min (A) (by C-G formula based on SCr of 9.07 mg/dL (H)). Liver Function Tests: Recent Labs  Lab 02/23/18 1224  AST 50*  ALT 36  ALKPHOS 110  BILITOT 0.5  PROT 7.0  ALBUMIN 2.8*   Recent Labs  Lab 02/23/18 1224  LIPASE 35   No results for input(s): AMMONIA in the last 168 hours. Coagulation Profile: No results for input(s): INR, PROTIME in the last 168 hours. Cardiac Enzymes: No results for input(s): CKTOTAL, CKMB, CKMBINDEX, TROPONINI in the last 168 hours. BNP (last 3 results) No results for input(s): PROBNP in the last 8760 hours. HbA1C: No results for input(s): HGBA1C in the last 72 hours. CBG: No results for input(s): GLUCAP in the last 168 hours. Lipid Profile: No results for input(s): CHOL, HDL, LDLCALC, TRIG, CHOLHDL, LDLDIRECT in the last 72 hours. Thyroid Function Tests: No results for input(s): TSH, T4TOTAL, FREET4, T3FREE, THYROIDAB in the last 72 hours. Anemia Panel: No results for input(s): VITAMINB12, FOLATE, FERRITIN, TIBC, IRON, RETICCTPCT in the last 72 hours. Sepsis Labs: Recent Labs  Lab 02/23/18 1415  LATICACIDVEN 1.76    Recent Results (from the past 240 hour(s))  MRSA PCR Screening      Status: None   Collection Time: 02/24/18 12:19 AM  Result Value Ref Range Status   MRSA by PCR NEGATIVE NEGATIVE Final    Comment:        The GeneXpert MRSA Assay (FDA approved for NASAL specimens only), is one component of a comprehensive MRSA colonization surveillance program. It is not intended to diagnose MRSA infection nor to guide or monitor treatment for MRSA infections. Performed at [facility], [place] [address] [place], [place] [address]      Radiology Studies: Ct Abdomen Pelvis Wo Contrast  Result Date: 02/23/2018 CLINICAL DATA:  Abdominal pain after dialysis.  Nausea and vomiting. EXAM: CT ABDOMEN AND PELVIS WITHOUT CONTRAST TECHNIQUE: Multidetector CT imaging of the abdomen and pelvis was performed following the standard protocol without IV contrast. COMPARISON:  08/07/2016 FINDINGS: Lower chest: Dependent atelectasis bilaterally. Hepatobiliary: 4.5 x 5.2 cm heterogeneous low-density mass is identified in the inferior liver, at the junction of the right and left hepatic lobes. This is right at the gallbladder  fossa and contracted gallbladder with known calcified gallstones is seen immediately adjacent. Liver otherwise unremarkable on this noncontrast exam. No intrahepatic or extrahepatic biliary dilation. Pancreas: No focal mass lesion. No dilatation of the main duct. No intraparenchymal cyst. No peripancreatic edema. Spleen: No splenomegaly. No focal mass lesion. Adrenals/Urinary Tract: No adrenal nodule or mass. Right kidney unremarkable. Adjacent nonobstructing 7 mm stones are identified in the interpolar left kidney. Ureters unremarkable. Bladder is decompressed. Stomach/Bowel: Stomach is nondistended. No gastric wall thickening. No evidence of outlet obstruction. Duodenum is normally positioned as is the ligament of Treitz. No small bowel wall thickening. No small bowel dilatation. The appendix is best seen on coronal and sagittal imaging and has normal features. There is  edema/inflammation around the cecum with cecal wall thickening evident (59/2). Right-sided diverticuli are noted in the cecum and ascending colon. Diverticular changes are noted in the left colon without evidence of diverticulitis. Vascular/Lymphatic: There is abdominal aortic atherosclerosis without aneurysm. There is no gastrohepatic or hepatoduodenal ligament lymphadenopathy. No intraperitoneal or retroperitoneal lymphadenopathy. No pelvic sidewall lymphadenopathy. Reproductive: The uterus has normal CT imaging appearance. There is no adnexal mass. Other: No intraperitoneal free fluid. Musculoskeletal: No worrisome lytic or sclerotic osseous abnormality. IMPRESSION: 1. Wall thickening in the cecum with pericecal edema/inflammation on a background of right-sided diverticulosis. Imaging features could be related to right-sided diverticulitis. Infectious/inflammatory right colitis also a consideration and right-sided neoplasm cannot be entirely excluded based on this CT scan. 2. Normal terminal ileum and appendix. 3. 4.5 x 5.2 cm heterogeneous low-density lesion in the inferior liver is new since 08/07/2016. This is immediately contiguous with the gallbladder fossa. Given changes in the right colon, liver abscess would be a consideration. Primary/secondary liver neoplasm could also have this appearance and proximity to the contracted gallbladder raises the question of gallbladder origin. If renal function permits, follow-up CT or MRI with contrast would likely prove helpful to further evaluate. 4.  Aortic Atherosclerois (ICD10-170.0) Electronically Signed   By: Misty Stanley M.D.   On: 02/23/2018 15:26   Ct Abdomen Pelvis W Contrast  Result Date: 02/23/2018 CLINICAL DATA:  72 year old female with history of abdominal pain and nausea and vomiting since yesterday. EXAM: CT ABDOMEN AND PELVIS WITH CONTRAST TECHNIQUE: Multidetector CT imaging of the abdomen and pelvis was performed using the standard protocol  following bolus administration of intravenous contrast. CONTRAST:  145mL OMNIPAQUE IOHEXOL 300 MG/ML  SOLN COMPARISON:  Noncontrast CT the abdomen and pelvis 02/23/2018. FINDINGS: Lower chest: Dependent areas of scarring and/or atelectasis in the lower lobes of the lungs bilaterally. Hepatobiliary: As noted on the prior study there is a large intermediate attenuation lesion in the central aspect of the liver adjacent to the gallbladder fossa. This is irregular in shape and therefore difficult to accurately measure, but this measures at least 5.2 x 4.4 cm (axial image 32 of series 3) predominantly involving segments 4B and 5. This lesion appears to have some thick internal septations. There is a smaller satellite lesion in the central aspect of segment 5 measuring 1.8 cm (axial image 34 of series 3). In addition, inferior to the lesion there are some dilated bile ducts, likely related to mass effect from the lesion centrally causing mild obstruction. In the posterior aspect of segment 6 of the liver (axial image 33 of series 3) there is an additional 11 mm lesion which is intermediate attenuation. Calcified gallstones in the gallbladder which appears nearly completely contracted. Pancreas: No pancreatic mass. No pancreatic ductal dilatation. No pancreatic or  peripancreatic fluid or inflammatory changes. Spleen: Unremarkable. Adrenals/Urinary Tract: 2 nonobstructive calculi are noted in the collecting system of left kidney measuring up to 8 mm. Right kidney and bilateral adrenal glands are normal in appearance. No hydroureteronephrosis. Urinary bladder is normal in appearance. Stomach/Bowel: Normal appearance of the stomach. No pathologic dilatation of small bowel or colon. Numerous colonic diverticulae are noted, most evident in the descending colon. Mural thickening in the region of the cecum, concerning for focal area of colitis, or potentially right-sided diverticulitis. The possibility of underlying mass is not  excluded. Appendix is not confidently identified on today's examination. Vascular/Lymphatic: Aortic atherosclerosis, without evidence of aneurysm or dissection in the abdominal or pelvic vasculature. No lymphadenopathy noted in the abdomen or pelvis. Reproductive: Uterus and ovaries are unremarkable in appearance. Other: No significant volume of ascites.  No pneumoperitoneum. Musculoskeletal: There are no aggressive appearing lytic or blastic lesions noted in the visualized portions of the skeleton. IMPRESSION: 1. Large multi-cystic lesion in the central aspect of the liver adjacent to the gallbladder fossa, with several smaller satellite lesions. These are all new compared to prior CT the chest, abdomen and pelvis 08/07/2016, concerning for intrahepatic abscesses. 2. Extensive mural thickening and inflammatory changes in the region of the cecum. This is nonspecific, and could reflect either right-sided diverticulitis, focal area of colitis, or potentially even underlying neoplasm. 3. Cholelithiasis. Gallbladder is nearly completely contracted without surrounding inflammatory changes to suggest an acute cholecystitis at this time. 4. Left-sided nephrolithiasis measuring up to 8 mm in the upper pole collecting system of left kidney. No ureteral stones or findings of urinary tract obstruction. 5. Aortic atherosclerosis. Electronically Signed   By: Vinnie Langton M.D.   On: 02/23/2018 21:23    Scheduled Meds: . Chlorhexidine Gluconate Cloth  6 each Topical Q0600  . fentaNYL   Intravenous Q4H  . mouth rinse  15 mL Mouth Rinse BID  . multivitamin  1 tablet Oral QHS  . nicotine  14 mg Transdermal Q2000  . pantoprazole (PROTONIX) IV  40 mg Intravenous Q12H   Continuous Infusions: . sodium chloride    . piperacillin-tazobactam (ZOSYN)  IV 3.375 g (02/23/18 2150)  . sodium chloride 500 mL (02/24/18 1126)     LOS: 1 day   Marylu Lund, MD [facility] Pager On Amion  If 7PM-7AM, please contact  night-coverage 02/24/2018, 11:54 AM

## 2018-02-24 NOTE — Progress Notes (Signed)
Results for Mary Chapman, Mary Chapman (MRN [id number]) as of 02/24/2018 06:20  Ref. Range 02/24/2018 05:21  Potassium Latest Ref Range: 3.5 - 5.1 mmol/L 6.4 (HH)  Will update MD on call.

## 2018-02-24 NOTE — Progress Notes (Signed)
Inpatient Diabetes Program Recommendations  AACE/ADA: New Consensus Statement on Inpatient Glycemic Control (2015)  Target Ranges:  Prepandial:   less than 140 mg/dL      Peak postprandial:   less than 180 mg/dL (1-2 hours)      Critically ill patients:  140 - 180 mg/dL   Lab Results  Component Value Date   GLUCAP 88 03/15/2017    Review of Glycemic Control Results for MONI, ROTHROCK (MRN [id number]) as of 02/24/2018 11:43  Ref. Range 02/24/2018 05:21  Glucose Latest Ref Range: 70 - 99 mg/dL 50 (L)   Diabetes history: No DM noted Outpatient Diabetes medications: none Current orders for Inpatient glycemic control: none  Inpatient Diabetes Program Recommendations:    Noted treatment for hyperkalemia and hypoglycemic event with Bs of 50 mg/dL without subsequent interventions or repeated CBG.  Contacted RN regarding POC. Informed that RR had been called on patient and she has not yet received HD. Encouraged to recheck CBG as appropriate, informed on AM serum.   MD: Consider adding Dextrose to IV fluids and monitoring CBGs Q4H.  Thanks, Bronson Curb, MSN, RNC-OB Diabetes Coordinator [phone] (8a-5p)

## 2018-02-24 NOTE — Progress Notes (Signed)
D/C PCA pump per MD order, 11 mL (40 mcg/ml) left in fentanyl syringe wasted with Sheliegh RN down sink.

## 2018-02-25 ENCOUNTER — Encounter (HOSPITAL_COMMUNITY)
Admission: EM | Disposition: A | Discharge status: Home / Self Care | Payer: Self-pay | Source: Home / Self Care | Attending: Internal Medicine

## 2018-02-25 ENCOUNTER — Inpatient Hospital Stay (HOSPITAL_COMMUNITY): Payer: Medicare Other

## 2018-02-25 ENCOUNTER — Inpatient Hospital Stay (HOSPITAL_COMMUNITY): Payer: Medicare Other | Admitting: Anesthesiology

## 2018-02-25 ENCOUNTER — Other Ambulatory Visit (HOSPITAL_COMMUNITY): Payer: Medicare Other

## 2018-02-25 ENCOUNTER — Encounter (HOSPITAL_COMMUNITY): Payer: Self-pay

## 2018-02-25 DIAGNOSIS — N051 Unspecified nephritic syndrome with focal and segmental glomerular lesions: Secondary | ICD-10-CM

## 2018-02-25 LAB — BASIC METABOLIC PANEL
ANION GAP: 13 (ref 5–15)
BUN: 34 mg/dL — ABNORMAL HIGH (ref 8–23)
CHLORIDE: 102 mmol/L (ref 98–111)
CO2: 25 mmol/L (ref 22–32)
Calcium: 7.2 mg/dL — ABNORMAL LOW (ref 8.9–10.3)
Creatinine, Ser: 5.71 mg/dL — ABNORMAL HIGH (ref 0.44–1.00)
GFR calc Af Amer: 8 mL/min — ABNORMAL LOW (ref >60)
GFR calc non Af Amer: 7 mL/min — ABNORMAL LOW (ref >60)
Glucose, Bld: 92 mg/dL (ref 70–99)
POTASSIUM: 4.3 mmol/L (ref 3.5–5.1)
Sodium: 140 mmol/L (ref 135–145)

## 2018-02-25 LAB — GLUCOSE, CAPILLARY
GLUCOSE-CAPILLARY: 92 mg/dL (ref 70–99)
GLUCOSE-CAPILLARY: 113 mg/dL — AB (ref 70–99)
GLUCOSE-CAPILLARY: 95 mg/dL (ref 70–99)
GLUCOSE-CAPILLARY: 102 mg/dL — AB (ref 70–99)
Glucose-Capillary: 97 mg/dL (ref 70–99)
Glucose-Capillary: 92 mg/dL (ref 70–99)

## 2018-02-25 LAB — CBC
HCT: 25.4 % — ABNORMAL LOW (ref 36.0–46.0)
Hemoglobin: 8.1 g/dL — ABNORMAL LOW (ref 12.0–15.0)
MCH: 30.9 pg (ref 26.0–34.0)
MCHC: 31.9 g/dL (ref 30.0–36.0)
MCV: 96.9 fL (ref 80.0–100.0)
Platelets: 147 10*3/uL — ABNORMAL LOW (ref 150–400)
RBC: 2.62 MIL/uL — ABNORMAL LOW (ref 3.87–5.11)
RDW: 17.3 % — ABNORMAL HIGH (ref 11.5–15.5)
WBC: 10.6 10*3/uL — ABNORMAL HIGH (ref 4.0–10.5)
nRBC: 0 % (ref 0.0–0.2)

## 2018-02-25 LAB — MAGNESIUM: Magnesium: 1.9 mg/dL (ref 1.7–2.4)

## 2018-02-25 LAB — PATHOLOGIST SMEAR REVIEW

## 2018-02-25 SURGERY — CANCELLED PROCEDURE

## 2018-02-25 MED ORDER — LABETALOL HCL 5 MG/ML IV SOLN: 5.0000 mg | INTRAVENOUS | Status: DC | PRN

## 2018-02-25 MED ORDER — CHLORHEXIDINE GLUCONATE CLOTH 2 % EX PADS: 6.0000 | MEDICATED_PAD | Freq: Every day | CUTANEOUS | Status: DC

## 2018-02-25 MED ORDER — IOPAMIDOL (ISOVUE-370) INJECTION 76%
INTRAVENOUS | Status: AC
  Filled 2018-02-25: qty 100

## 2018-02-25 MED ORDER — METOCLOPRAMIDE HCL 5 MG/ML IJ SOLN
10.0000 mg | Freq: Once | INTRAMUSCULAR | Status: AC
  Administered 2018-02-25: 10 mg via INTRAVENOUS
  Filled 2018-02-25: qty 2

## 2018-02-25 MED ORDER — IOPAMIDOL (ISOVUE-370) INJECTION 76%
100.0000 mL | Freq: Once | INTRAVENOUS | Status: AC | PRN
  Administered 2018-02-25: 100 mL via INTRAVENOUS

## 2018-02-25 NOTE — Progress Notes (Signed)
[facility] Progress Note  Subjective: pt states "I heard had one more bloody stool this morning", abd pain a little better, no n/v, no SOB. Weighed pt at 68.8 kg, 1.1 L in yest, BP's better this am 110/70.  Hb down to 8.3  Vitals:   02/25/18 0403 02/25/18 0534 02/25/18 0629 02/25/18 0812  BP:    (!) 110/57  Pulse:  (!) 108 (!) 104 (!) 104  Resp:  16 13 13   Temp: 98.5 F (36.9 C)   99.1 F (37.3 C)  TempSrc: Oral   Oral  SpO2:  100% 97% 99%  Weight:      Height:        Inpatient medications: . Chlorhexidine Gluconate Cloth  6 each Topical Q0600  . insulin aspart  0-9 Units Subcutaneous Q4H  . insulin aspart  5 Units Subcutaneous Once  . mouth rinse  15 mL Mouth Rinse BID  . multivitamin  1 tablet Oral QHS  . nicotine  14 mg Transdermal Q2000  . pantoprazole (PROTONIX) IV  40 mg Intravenous Q12H   . sodium chloride    . dextrose 5 % and 0.9% NaCl 75 mL/hr at 02/24/18 2213  . piperacillin-tazobactam (ZOSYN)  IV 3.375 g (02/25/18 0530)   sodium chloride, acetaminophen **OR** acetaminophen, calcium carbonate (dosed in mg elemental calcium), camphor-menthol **AND** hydrOXYzine, docusate sodium, feeding supplement (NEPRO CARB STEADY), HYDROmorphone (DILAUDID) injection, lidocaine-prilocaine, pentafluoroprop-tetrafluoroeth, sorbitol, zolpidem  Iron/TIBC/Ferritin/ %Sat    Component Value Date/Time   IRON 92 03/15/2017 1331   TIBC 175 (L) 03/15/2017 1331   FERRITIN 206 08/10/2016 0204   IRONPCTSAT 53 (H) 03/15/2017 1331    Exam: Gen alert, nasal O2 No jvd or bruits Chest clear bilat to bases RRR no MRG  Abd tender RLQ, normal BS, no ascites or hsm Ext no LE edema Neuro is alert, Ox 3 , nf L arm AVF+bruit    Home meds:  - metoprolol 50 bid  - MVI/ nicotine patch/ vitamins   Dialysis: MWF [facility]  4h  64.7kg  2/2.5 bath   L AVF   Hep 4000 + 2000 midrun  - hect 2 ug  - venofer 50/wk     Impression/ Plan 1. RLQ abd pain/ N/V -  per primary, possible diverticulitis vs other, on IV abx 2. GI bleed/ anemia of ABL - Hb 11 > 8's today. SP nuclear scan. Transfuse prn 3. Liver lesion - abscess vs mass, per GI/ IR / primary 4. ESRD - HD MWF.  Plan HD tomorrow. No heparin, UF 2-3 as tol 5. Volume - up 3-4 kg today, will dc IVF"s , avoid further IVF's  6. HTN - holding metop 7. Smoker 8. Anemia ckd - no esa, Hb 11 9. MBD ckd - cont meds, Ca low, phos pnd   [name] MD [place] pager [phone]   02/25/2018, 9:03 AM   Recent Labs  Lab 02/23/18 1224  02/24/18 1428 02/25/18 0348  NA 135   < > 132* 140  K 6.0*   < > 6.3* 4.3  CL 92*   < > 96* 102  CO2 28   < > 19* 25  GLUCOSE 95   < > 96 92  BUN 39*   < > 65* 34*  CREATININE 7.79*   < > 9.48* 5.71*  CALCIUM 7.6*   < > 6.6* 7.2*  ALBUMIN 2.8*  --   --   --    < > = values in this  interval not displayed.   Recent Labs  Lab 02/23/18 1224  AST 50*  ALT 36  ALKPHOS 110  BILITOT 0.5  PROT 7.0   Recent Labs  Lab 02/23/18 1224  02/24/18 1142 02/24/18 1924 02/25/18 0348  WBC 20.3*   < > 14.6*  --  10.6*  NEUTROABS 18.3*  --   --   --   --   HGB 13.0   < > 9.9* 8.9* 8.1*  HCT 39.2   < > 33.2* 27.3* 25.4*  MCV 95.1   < > 100.6*  --  96.9  PLT 201   < > 162  --  147*   < > = values in this interval not displayed.

## 2018-02-25 NOTE — Progress Notes (Signed)
Patient off unit for dialysis via bed. Ronalee Belts, RN gave Dialysis RN report.

## 2018-02-25 NOTE — Anesthesia Preprocedure Evaluation (Signed)
Anesthesia Evaluation  Patient identified by MRN, date of birth, ID band Patient awake    Reviewed: Allergy & Precautions, NPO status , Patient's Chart, lab work & pertinent test results  History of Anesthesia Complications Negative for: history of anesthetic complications  Airway        Dental   Pulmonary shortness of breath, Current Smoker,           Cardiovascular hypertension,      Neuro/Psych negative neurological ROS  negative psych ROS   GI/Hepatic negative GI ROS, Neg liver ROS,   Endo/Other    Renal/GU CRFRenal disease     Musculoskeletal   Abdominal   Peds  Hematology   Anesthesia Other Findings 2018 tte: Left ventricle: The cavity size was normal. Systolic function was   vigorous. The estimated ejection fraction was in the range of 65%   to 70%. Wall motion was normal; there were no regional wall   motion abnormalities. Doppler parameters are consistent with   abnormal left ventricular relaxation (grade 1 diastolic   dysfunction). There was no evidence of elevated ventricular   filling pressure by Doppler parameters. - Aortic valve: There was no regurgitation. - Aortic root: The aortic root was normal in size. - Mitral valve: There was mild regurgitation. - Right ventricle: Systolic function was normal. - Right atrium: The atrium was normal in size. - Tricuspid valve: There was mild regurgitation. - Pulmonic valve: There was no regurgitation. - Pulmonary arteries: Systolic pressure was within the normal   range. - Inferior vena cava: The vessel was dilated. The respirophasic   diameter changes were blunted (< 50%), consistent with elevated   central venous pressure. - Pericardium, extracardiac: There was no pericardial effusion.  Reproductive/Obstetrics                             Anesthesia Physical Anesthesia Plan Anesthesia Quick Evaluation

## 2018-02-25 NOTE — Progress Notes (Addendum)
Referring Physician(s): Chiu,S  Supervising Physician: Markus Daft  Patient Status:  [name] - In-pt  Chief Complaint:  Abdominal pain  Subjective: Patient familiar to IR service from prior random left renal biopsy on 08/11/2016 and hemodialysis catheter placement on 08/15/2016.  She has a history of end-stage renal disease, hypertension, tobacco abuse was admitted to [facility] on 10/8 with abdominal pain, nausea, vomiting.  She has also had some recent bloody stools.  Recent CT scan has revealed:  1. Large multi-cystic lesion in the central aspect of the liver adjacent to the gallbladder fossa, with several smaller satellite lesions. These are all new compared to prior CT the chest, abdomen and pelvis 08/07/2016, concerning for intrahepatic abscesses. 2. Extensive mural thickening and inflammatory changes in the region of the cecum. This is nonspecific, and could reflect either right-sided diverticulitis, focal area of colitis, or potentially even underlying neoplasm. 3. Cholelithiasis. Gallbladder is nearly completely contracted without surrounding inflammatory changes to suggest an acute cholecystitis at this time. 4. Left-sided nephrolithiasis measuring up to 8 mm in the upper pole collecting system of left kidney. No ureteral stones or findings of urinary tract obstruction. 5. Aortic atherosclerosis  Nuclear medicine bleeding scan yesterday revealed  bleeding in the left abdomen favoring small bowel source  Request now received for image guided aspiration/possible drainage/biopsy of large central lesion?abscess.  Current labs include WBC 10.6, hemoglobin 8.1, platelets 147k, PT/INR pending.  BP remains soft, temperature 99.8.  She denies fever, headache, chest pain, dyspnea, cough, back pain, or vomiting. She does have fatigue and abd pain persists, predominantly rt sided.   Past Medical History:  Diagnosis Date  . Diverticulitis   . Focal segmental glomerulosclerosis    ESRD on MWF HD  . Hypertension   . Tobacco abuse    Past Surgical History:  Procedure Laterality Date  . AV FISTULA PLACEMENT Left 03/18/2017   Procedure: Brachiocephalic ARTERIOVENOUS (AV) FISTULA CREATION left arm;  Surgeon: Elam Dutch, MD;  Location: [place];  Service: Vascular;  Laterality: Left;  . INSERTION OF DIALYSIS CATHETER N/A 03/18/2017   Procedure: INSERTION OF DIALYSIS CATHETER;  Surgeon: Elam Dutch, MD;  Location: [place];  Service: Vascular;  Laterality: N/A;  . IR GENERIC HISTORICAL  08/15/2016   IR US GUIDE VASC ACCESS RIGHT 08/15/2016 Arne Cleveland, MD [facility]-INTERV RAD  . IR GENERIC HISTORICAL  08/15/2016   IR FLUORO GUIDE CV LINE RIGHT 08/15/2016 Arne Cleveland, MD [facility]INTER[facility]  . TUBAL LIGATION         Allergies: Patient has no known allergies.  Medications: Prior to Admission medications   Medication Sig Start Date End Date Taking? Authorizing Provider  metoprolol tartrate (LOPRESSOR) 50 MG tablet Take 50 mg by mouth 2 (two) times daily. 03/05/17  Yes [provider]  multivitamin (RENA-VIT) TABS tablet Take 1 tablet by mouth at bedtime. 08/20/16  Yes Rama, Venetia Maxon, MD  nicotine (NICODERM CQ - DOSED IN MG/24 HOURS) 21 mg/24hr patch Place 1 patch (21 mg total) onto the skin daily. 08/20/16   Rama, Venetia Maxon, MD  Vitamin D, Ergocalciferol, (DRISDOL) 50000 units CAPS capsule Take 50,000 Units by mouth once a week. 01/16/17   [provider]     Vital Signs: BP (!) 94/56 (BP Location: Right Arm)   Pulse (!) 102   Temp 99.8 F (37.7 C) (Oral)   Resp 16   Ht 5\' 6"  (1.676 m)   Wt 151 lb 10.8 oz (68.8 kg)   SpO2 98%  BMI 24.48 kg/m   Physical Exam awake, alert.  Chest-slighly diminished breath sounds bases.  Heart with regular rate and rhythm.  Abdomen soft, few bowel sounds, mild to moderately tender right upper and lower abdominal regions /some mild tenderness on the left.  No lower extremity edema.LUE fistula with good  thrill/bruit  Imaging: Ct Abdomen Pelvis Wo Contrast  Result Date: 02/23/2018 CLINICAL DATA:  Abdominal pain after dialysis.  Nausea and vomiting. EXAM: CT ABDOMEN AND PELVIS WITHOUT CONTRAST TECHNIQUE: Multidetector CT imaging of the abdomen and pelvis was performed following the standard protocol without IV contrast. COMPARISON:  08/07/2016 FINDINGS: Lower chest: Dependent atelectasis bilaterally. Hepatobiliary: 4.5 x 5.2 cm heterogeneous low-density mass is identified in the inferior liver, at the junction of the right and left hepatic lobes. This is right at the gallbladder fossa and contracted gallbladder with known calcified gallstones is seen immediately adjacent. Liver otherwise unremarkable on this noncontrast exam. No intrahepatic or extrahepatic biliary dilation. Pancreas: No focal mass lesion. No dilatation of the main duct. No intraparenchymal cyst. No peripancreatic edema. Spleen: No splenomegaly. No focal mass lesion. Adrenals/Urinary Tract: No adrenal nodule or mass. Right kidney unremarkable. Adjacent nonobstructing 7 mm stones are identified in the interpolar left kidney. Ureters unremarkable. Bladder is decompressed. Stomach/Bowel: Stomach is nondistended. No gastric wall thickening. No evidence of outlet obstruction. Duodenum is normally positioned as is the ligament of Treitz. No small bowel wall thickening. No small bowel dilatation. The appendix is best seen on coronal and sagittal imaging and has normal features. There is edema/inflammation around the cecum with cecal wall thickening evident (59/2). Right-sided diverticuli are noted in the cecum and ascending colon. Diverticular changes are noted in the left colon without evidence of diverticulitis. Vascular/Lymphatic: There is abdominal aortic atherosclerosis without aneurysm. There is no gastrohepatic or hepatoduodenal ligament lymphadenopathy. No intraperitoneal or retroperitoneal lymphadenopathy. No pelvic sidewall lymphadenopathy.  Reproductive: The uterus has normal CT imaging appearance. There is no adnexal mass. Other: No intraperitoneal free fluid. Musculoskeletal: No worrisome lytic or sclerotic osseous abnormality. IMPRESSION: 1. Wall thickening in the cecum with pericecal edema/inflammation on a background of right-sided diverticulosis. Imaging features could be related to right-sided diverticulitis. Infectious/inflammatory right colitis also a consideration and right-sided neoplasm cannot be entirely excluded based on this CT scan. 2. Normal terminal ileum and appendix. 3. 4.5 x 5.2 cm heterogeneous low-density lesion in the inferior liver is new since 08/07/2016. This is immediately contiguous with the gallbladder fossa. Given changes in the right colon, liver abscess would be a consideration. Primary/secondary liver neoplasm could also have this appearance and proximity to the contracted gallbladder raises the question of gallbladder origin. If renal function permits, follow-up CT or MRI with contrast would likely prove helpful to further evaluate. 4.  Aortic Atherosclerois (ICD10-170.0) Electronically Signed   By: Misty Stanley M.D.   On: 02/23/2018 15:26   Nm Gi Blood Loss  Addendum Date: 02/24/2018   ADDENDUM REPORT: 02/24/2018 19:15 ADDENDUM: Study discussed by telephone with Dr. Silverio Decamp on 02/24/2018 at 1904 hours. Electronically Signed   By: Genevie Ann M.D.   On: 02/24/2018 19:15   Result Date: 02/24/2018 CLINICAL DATA:  72 year old female with right upper quadrant pain nausea and vomiting. Onset of dark bloody stools today. EXAM: NUCLEAR MEDICINE GASTROINTESTINAL BLEEDING SCAN TECHNIQUE: Sequential abdominal images were obtained following intravenous administration of Tc-80m labeled red blood cells. RADIOPHARMACEUTICALS:  26.1 mCi Tc-66m pertechnetate in-vitro labeled red cells. COMPARISON:  CT Abdomen and Pelvis 03/05/2018 and earlier. FINDINGS: Physiologic blood pool activity  demonstrated throughout the 1st hour of  imaging. Toward the end of the 1st hour there is questionable faint left abdominal small bowel type activity (image 53). During the 2nd hour there is definite abnormal radiotracer activity in the left mid abdomen which appears to move through multiple closely looped segments of bowel in that region. Comparison of the large and small bowel anatomy on the coronal CT images yesterday suggests small-bowel position. IMPRESSION: Positive GI bleeding is detected during the 2nd hour of the study in the left abdomen. The morphology favors a small-bowel bleeding source. Electronically Signed: By: Genevie Ann M.D. On: 02/24/2018 18:40   Ct Abdomen Pelvis W Contrast  Result Date: 02/23/2018 CLINICAL DATA:  72 year old female with history of abdominal pain and nausea and vomiting since yesterday. EXAM: CT ABDOMEN AND PELVIS WITH CONTRAST TECHNIQUE: Multidetector CT imaging of the abdomen and pelvis was performed using the standard protocol following bolus administration of intravenous contrast. CONTRAST:  153mL OMNIPAQUE IOHEXOL 300 MG/ML  SOLN COMPARISON:  Noncontrast CT the abdomen and pelvis 02/23/2018. FINDINGS: Lower chest: Dependent areas of scarring and/or atelectasis in the lower lobes of the lungs bilaterally. Hepatobiliary: As noted on the prior study there is a large intermediate attenuation lesion in the central aspect of the liver adjacent to the gallbladder fossa. This is irregular in shape and therefore difficult to accurately measure, but this measures at least 5.2 x 4.4 cm (axial image 32 of series 3) predominantly involving segments 4B and 5. This lesion appears to have some thick internal septations. There is a smaller satellite lesion in the central aspect of segment 5 measuring 1.8 cm (axial image 34 of series 3). In addition, inferior to the lesion there are some dilated bile ducts, likely related to mass effect from the lesion centrally causing mild obstruction. In the posterior aspect of segment 6 of the  liver (axial image 33 of series 3) there is an additional 11 mm lesion which is intermediate attenuation. Calcified gallstones in the gallbladder which appears nearly completely contracted. Pancreas: No pancreatic mass. No pancreatic ductal dilatation. No pancreatic or peripancreatic fluid or inflammatory changes. Spleen: Unremarkable. Adrenals/Urinary Tract: 2 nonobstructive calculi are noted in the collecting system of left kidney measuring up to 8 mm. Right kidney and bilateral adrenal glands are normal in appearance. No hydroureteronephrosis. Urinary bladder is normal in appearance. Stomach/Bowel: Normal appearance of the stomach. No pathologic dilatation of small bowel or colon. Numerous colonic diverticulae are noted, most evident in the descending colon. Mural thickening in the region of the cecum, concerning for focal area of colitis, or potentially right-sided diverticulitis. The possibility of underlying mass is not excluded. Appendix is not confidently identified on today's examination. Vascular/Lymphatic: Aortic atherosclerosis, without evidence of aneurysm or dissection in the abdominal or pelvic vasculature. No lymphadenopathy noted in the abdomen or pelvis. Reproductive: Uterus and ovaries are unremarkable in appearance. Other: No significant volume of ascites.  No pneumoperitoneum. Musculoskeletal: There are no aggressive appearing lytic or blastic lesions noted in the visualized portions of the skeleton. IMPRESSION: 1. Large multi-cystic lesion in the central aspect of the liver adjacent to the gallbladder fossa, with several smaller satellite lesions. These are all new compared to prior CT the chest, abdomen and pelvis 08/07/2016, concerning for intrahepatic abscesses. 2. Extensive mural thickening and inflammatory changes in the region of the cecum. This is nonspecific, and could reflect either right-sided diverticulitis, focal area of colitis, or potentially even underlying neoplasm. 3.  Cholelithiasis. Gallbladder is nearly completely contracted without  surrounding inflammatory changes to suggest an acute cholecystitis at this time. 4. Left-sided nephrolithiasis measuring up to 8 mm in the upper pole collecting system of left kidney. No ureteral stones or findings of urinary tract obstruction. 5. Aortic atherosclerosis. Electronically Signed   By: Vinnie Langton M.D.   On: 02/23/2018 21:23    Labs:  CBC: Recent Labs    02/23/18 1224 02/24/18 0521 02/24/18 1142 02/24/18 1924 02/25/18 0348  WBC 20.3* 13.4* 14.6*  --  10.6*  HGB 13.0 11.7* 9.9* 8.9* 8.1*  HCT 39.2 36.1 33.2* 27.3* 25.4*  PLT 201 171 162  --  147*    COAGS: Recent Labs    03/18/17 0308  INR 0.97    BMP: Recent Labs    02/23/18 1224 02/24/18 0521 02/24/18 1428 02/25/18 0348  NA 135 130* 132* 140  K 6.0* 6.4* 6.3* 4.3  CL 92* 92* 96* 102  CO2 28 23 19* 25  GLUCOSE 95 50* 96 92  BUN 39* 53* 65* 34*  CALCIUM 7.6* 6.5* 6.6* 7.2*  CREATININE 7.79* 9.07* 9.48* 5.71*  GFRNONAA 5* 4* 4* 7*  GFRAA 5* 4* 4* 8*    LIVER FUNCTION TESTS: Recent Labs    03/16/17 1025 03/17/17 0730 03/18/17 0308  03/20/17 0323 03/20/17 1955 03/21/17 0859 02/23/18 1224  BILITOT 0.2* 0.6 0.4  --   --   --   --  0.5  AST 15 18 15   --   --   --   --  50*  ALT 12* 12* 10*  --   --   --   --  36  ALKPHOS 54 57 48  --   --   --   --  110  PROT 4.0* 4.5* 3.9*  --   --   --   --  7.0  ALBUMIN 1.4*  1.4* 1.6* 1.4*   < > 1.4* 1.6* 1.5* 2.8*   < > = values in this interval not displayed.    Assessment and Plan: Pt with history of end-stage renal disease, hypertension, tobacco abuse ; admitted to [facility] on 10/8 with abdominal pain, nausea, vomiting.  She has also had some recent bloody stools.  Recent CT scan has revealed:  1. Large multi-cystic lesion in the central aspect of the liver adjacent to the gallbladder fossa, with several smaller satellite lesions. These are all new compared to prior CT the  chest, abdomen and pelvis 08/07/2016, concerning for intrahepatic abscesses. 2. Extensive mural thickening and inflammatory changes in the region of the cecum. This is nonspecific, and could reflect either right-sided diverticulitis, focal area of colitis, or potentially even underlying neoplasm. 3. Cholelithiasis. Gallbladder is nearly completely contracted without surrounding inflammatory changes to suggest an acute cholecystitis at this time. 4. Left-sided nephrolithiasis measuring up to 8 mm in the upper pole collecting system of left kidney. No ureteral stones or findings of urinary tract obstruction. 5. Aortic atherosclerosis  Nuclear medicine bleeding scan yesterday revealed  bleeding in the left abdomen favoring small bowel source  Request now received for image guided aspiration/possible drainage/biopsy of large central lesion?abscess.  Current labs include WBC 10.6, hemoglobin 8.1, platelets 147k, PT/INR pending.  BP remains soft, temperature 99.8. She states that she is scheduled for endoscopy/enteroscopy today. Imaging studies were reviewed by Dr. Barbie Banner.Risks and benefits discussed with the patient including bleeding, infection, damage to adjacent structures, bowel perforation/fistula connection, and sepsis.  All of the patient's questions were answered, patient is agreeable to proceed. Consent signed  and in chart.  Procedure tent scheduled for 10/11   Electronically Signed: D. Rowe Robert, PA-C 02/25/2018, 11:34 AM   I spent a total of 30 minutes at the the patient's bedside AND on the patient's hospital floor or unit, greater than 50% of which was counseling/coordinating care for image guided aspiration/drainage/possible biopsy of liver lesion?  abscess    Patient ID: Mary Chapman, female   DOB: 09/27/1945, 72 y.o.   MRN: [id number]

## 2018-02-25 NOTE — Interval H&P Note (Signed)
History and Physical Interval Note:  02/25/2018 1:06 PM  Mary Chapman  has presented today for surgery, with the diagnosis of ongoing GI bleed with a positive bleeding scan.  The various methods of treatment have been discussed with the patient and family. After consideration of risks, benefits and other options for treatment, the patient has consented to  Procedure(s): ENTEROSCOPY (N/A) as a surgical intervention .  The patient's history has been reviewed, patient examined, no change in status, stable for surgery.  I have reviewed the patient's chart and labs.  Questions were answered to the patient's satisfaction.     [place]

## 2018-02-25 NOTE — Progress Notes (Signed)
CC: Right lower quadrant pain, nausea and vomiting/bloody stools  Subjective: Still having Right sided and most severe RLQ pain.  She is not sure about any further stools or bleeding from her stools.  She is up in the chair but does not appear overly distended.    Objective: Vital signs in last 24 hours: Temp:  [98.2 F (36.8 C)-99.1 F (37.3 C)] 99.1 F (37.3 C) (10/10 0812) Pulse Rate:  [76-118] 104 (10/10 0812) Resp:  [10-21] 13 (10/10 0812) BP: (75-123)/(48-77) 110/57 (10/10 0812) SpO2:  [90 %-100 %] 99 % (10/10 0812) Weight:  [65.5 kg-68.8 kg] 68.8 kg (10/10 0859) Last BM Date: 02/24/18 30 PO 1100 IV Stool x 4 Afebrile, some mild tachycardia, Sats good on Victor K+ 6.3 >> 4.3 Creatinine 5.71 Mag 1.9 WBC 20.3 >> 10.6 H/H 13/39 >> 8.1/25.4 CT 10/8:  Large multi-cystic lesion in the central aspect of the liver adjacent to the gallbladder fossa, with several smaller satellite lesions. These are all new compared to prior CT the chest, abdomen and pelvis 08/07/2016, concerning for intrahepatic abscesses. 2. Extensive mural thickening and inflammatory changes in the region of the cecum. This is nonspecific, and could reflect either right-sided diverticulitis, focal area of colitis, or potentially even underlying neoplasm.  Bleeding scan 10/9:  Positive GI bleeding is detected during the 2nd hour of the study in the left abdomen.  The morphology favors a small-bowel bleeding source.   Intake/Output from previous day: 10/09 0701 - 10/10 0700 In: 1154.1 [P.O.:30; I.V.:954; IV Piggyback:170] Out: 0  Intake/Output this shift: No intake/output data recorded.  General appearance: alert, cooperative and no distress Resp: some end expiratory wheezing.   GI: soft, tender Right side, RLQ more than left.   Lab Results:  Recent Labs    02/24/18 1142 02/24/18 1924 02/25/18 0348  WBC 14.6*  --  10.6*  HGB 9.9* 8.9* 8.1*  HCT 33.2* 27.3* 25.4*  PLT 162  --  147*     BMET Recent Labs    02/24/18 1428 02/25/18 0348  NA 132* 140  K 6.3* 4.3  CL 96* 102  CO2 19* 25  GLUCOSE 96 92  BUN 65* 34*  CREATININE 9.48* 5.71*  CALCIUM 6.6* 7.2*   PT/INR No results for input(s): LABPROT, INR in the last 72 hours.  Recent Labs  Lab 02/23/18 1224  AST 50*  ALT 36  ALKPHOS 110  BILITOT 0.5  PROT 7.0  ALBUMIN 2.8*     Lipase     Component Value Date/Time   LIPASE 35 02/23/2018 1224     Medications: . Chlorhexidine Gluconate Cloth  6 each Topical Q0600  . Chlorhexidine Gluconate Cloth  6 each Topical Q0600  . insulin aspart  0-9 Units Subcutaneous Q4H  . insulin aspart  5 Units Subcutaneous Once  . mouth rinse  15 mL Mouth Rinse BID  . multivitamin  1 tablet Oral QHS  . nicotine  14 mg Transdermal Q2000  . pantoprazole (PROTONIX) IV  40 mg Intravenous Q12H   . sodium chloride    . dextrose 5 % and 0.9% NaCl 75 mL/hr at 02/24/18 2213  . piperacillin-tazobactam (ZOSYN)  IV 3.375 g (02/25/18 0530)   Anti-infectives (From admission, onward)   Start     Dose/Rate Route Frequency Ordered Stop   02/23/18 1319  piperacillin-tazobactam (ZOSYN) IVPB 3.375 g     3.375 g 12.5 mL/hr over 240 Minutes Intravenous Every 12 hours 02/23/18 1319       Assessment/Plan  End-stage renal disease on dialysis Hypertension Ongoing tobacco use Hyperlipidemia  Right lower quadrant pain Possible diverticulitis vs colitis vs neoplasm of the cecum - GI following here - Colonoscopy in [place] 3-4 years ago Cystic lesions of the liver/possible liver abscess - IR consult pending  FEN:  IV fluids/NPO ID:  Zosyn 10/8 =>> day 3 DVT:  SCD - Bloody stools Follow up:  TBD  Plan:  Continue medical Rx currently. Will follow with Medicine and GI.      LOS: 2 days    Damen Windsor 02/25/2018 [phone]

## 2018-02-25 NOTE — Progress Notes (Signed)
HD tx initiated via 15Gx2 w/o problem, pull/push/flush well w/o problem, bp low, will cont to monitor while on HD tx

## 2018-02-25 NOTE — Progress Notes (Signed)
HD tx completed @ 0225 w/o problem, UF goal of keep even met,blood rinsed back, VSS, report called to Wenda Overland, RN

## 2018-02-25 NOTE — Progress Notes (Addendum)
PROGRESS NOTE    Mary Chapman  CBJ:[id number] DOB: [birth year] DOA: 02/23/2018 PCP: [facility]    Brief Narrative:  72 y.o. female with medical history significant of h/o ESRD on HD due to FSGS; HTN; and tobacco dependence presenting with abdominal pain.   She developed abdominal pain, she usually has a high pain tolerance and she couldn't stand it anymore.  It was painful all night.  The pain started yesterday about noon.  She went to HD and felt ok.  RLQ pain mostly, although the pain is also diffuse.  It came on acutely and worsened.  +nausea and vomiting, starting last night and also this AM.  Last emesis was about 2pm.  Last BM was last evening and was loose; she had a total of 3-4 loose BMs yesterday (but she often has loose stools while on HD).  No fevers, no chills.  Her last colonoscopy was 3-4 years ago at [facility]; it was normal, to her knowledge.   ED Course: Pain started yesterday and worsened. WBC 20k. No fever, normal lactate. CT with cecal inflammation and diverticular changes - ?R-sided diverticulitis. Cannot r/o mass. Also with liver changes - abscess, neoplasm on ddx. Suggest MRI or CT with contrast. She was given Zosyn. Needs admission for intraabdominal infection. Surgery has not been called.  Assessment & Plan:   Principal Problem:   Diverticulitis Active Problems:   Tobacco abuse   Essential hypertension   Focal segmental glomerulosclerosis   Hyperlipidemia   ESRD on dialysis ([place])  Intraabdominal infection - Proximal diverticulitis, Liver abscess -Patient developed acute onset of RLQ pain prior to admit with n/v, no fever, +leukocytosis -CT personally reviewed. Findings suggestive of diverticulitis at level of cecum with findings of 4-5cm fluid collections in liver c/w liver abscess -General Surgery consulted, recs noted. Continue IV abx -Discussed with IR. Tentative plans for drainage of liver lesion on 10/11 -GI consulted,  see below  ESRD on HD, due to FSGS -Patient on chronic MWF HD -Nephrology following -Tolerated HD yesterday. Cont HD per Nephrology  HTN -BP stable off bp meds -fluid responsive  Acute blood loss anemia -Large dark stools noted with resultant drop in 2-3 g of hgb -Hgb seems to have stabilized overnight -GI following. Plans for endoscopy today -Bleeding scan overnight with evidence of bleeding in small bowels -Repeat cbc in AM  Tobacco abuse -Cessation was encouraged -Stable at this time  DVT prophylaxis: SCD Code Status: Full Family Communication: Pt in room, family not at bedside Disposition Plan: Uncertain at this time  Consultants:   GI  General Surgery  IR  Procedures:     Antimicrobials: Anti-infectives (From admission, onward)   Start     Dose/Rate Route Frequency Ordered Stop   02/23/18 1319  piperacillin-tazobactam (ZOSYN) IVPB 3.375 g     3.375 g 12.5 mL/hr over 240 Minutes Intravenous Every 12 hours 02/23/18 1319        Subjective: Still with abd discomfort  Objective: Vitals:   02/25/18 0534 02/25/18 0629 02/25/18 0812 02/25/18 0859  BP:   (!) 110/57   Pulse: (!) 108 (!) 104 (!) 104   Resp: 16 13 13    Temp:   99.1 F (37.3 C)   TempSrc:   Oral   SpO2: 100% 97% 99%   Weight:    68.8 kg  Height:        Intake/Output Summary (Last 24 hours) at 02/25/2018 0946 Last data filed at 02/25/2018 0656 Gross per 24 hour  Intake 1154.05 ml  Output 0 ml  Net 1154.05 ml   Filed Weights   02/24/18 2346 02/25/18 0245 02/25/18 0859  Weight: 65.5 kg 65.5 kg 68.8 kg    Examination: General exam: Awake, sitting in chair Respiratory system: Normal respiratory effort, no wheezing Cardiovascular system: regular rate, s1, s2 Gastrointestinal system: soft, pos BS, generally tender to mild palpation with grimacing Central nervous system: CN2-12 grossly intact, strength intact Extremities: Perfused, no clubbing Skin: Normal skin turgor, no  notable skin lesions seen Psychiatry: Mood normal // no visual hallucinations   Data Reviewed: I have personally reviewed following labs and imaging studies  CBC: Recent Labs  Lab 02/23/18 1224 02/24/18 0521 02/24/18 1142 02/24/18 1924 02/25/18 0348  WBC 20.3* 13.4* 14.6*  --  10.6*  NEUTROABS 18.3*  --   --   --   --   HGB 13.0 11.7* 9.9* 8.9* 8.1*  HCT 39.2 36.1 33.2* 27.3* 25.4*  MCV 95.1 96.3 100.6*  --  96.9  PLT 201 171 162  --  782*   Basic Metabolic Panel: Recent Labs  Lab 02/23/18 1224 02/24/18 0521 02/24/18 1428 02/25/18 0348  NA 135 130* 132* 140  K 6.0* 6.4* 6.3* 4.3  CL 92* 92* 96* 102  CO2 28 23 19* 25  GLUCOSE 95 50* 96 92  BUN 39* 53* 65* 34*  CREATININE 7.79* 9.07* 9.48* 5.71*  CALCIUM 7.6* 6.5* 6.6* 7.2*  MG  --   --   --  1.9   GFR: Estimated Creatinine Clearance: 8.5 mL/min (A) (by C-G formula based on SCr of 5.71 mg/dL (H)). Liver Function Tests: Recent Labs  Lab 02/23/18 1224  AST 50*  ALT 36  ALKPHOS 110  BILITOT 0.5  PROT 7.0  ALBUMIN 2.8*   Recent Labs  Lab 02/23/18 1224  LIPASE 35   No results for input(s): AMMONIA in the last 168 hours. Coagulation Profile: No results for input(s): INR, PROTIME in the last 168 hours. Cardiac Enzymes: No results for input(s): CKTOTAL, CKMB, CKMBINDEX, TROPONINI in the last 168 hours. BNP (last 3 results) No results for input(s): PROBNP in the last 8760 hours. HbA1C: No results for input(s): HGBA1C in the last 72 hours. CBG: Recent Labs  Lab 02/24/18 1249 02/24/18 1833 02/24/18 2025 02/25/18 0401 02/25/18 0809  GLUCAP 114* 110* 95 92 97   Lipid Profile: No results for input(s): CHOL, HDL, LDLCALC, TRIG, CHOLHDL, LDLDIRECT in the last 72 hours. Thyroid Function Tests: No results for input(s): TSH, T4TOTAL, FREET4, T3FREE, THYROIDAB in the last 72 hours. Anemia Panel: No results for input(s): VITAMINB12, FOLATE, FERRITIN, TIBC, IRON, RETICCTPCT in the last 72 hours. Sepsis  Labs: Recent Labs  Lab 02/23/18 1415  LATICACIDVEN 1.76    Recent Results (from the past 240 hour(s))  MRSA PCR Screening     Status: None   Collection Time: 02/24/18 12:19 AM  Result Value Ref Range Status   MRSA by PCR NEGATIVE NEGATIVE Final    Comment:        The GeneXpert MRSA Assay (FDA approved for NASAL specimens only), is one component of a comprehensive MRSA colonization surveillance program. It is not intended to diagnose MRSA infection nor to guide or monitor treatment for MRSA infections. Performed at [facility], [place] [address], [place], [place] [address]   Culture, blood (routine x 2)     Status: None (Preliminary result)   Collection Time: 02/24/18 10:15 AM  Result Value Ref Range Status   Specimen Description  BLOOD RIGHT HAND  Final   Special Requests   Final    BOTTLES DRAWN AEROBIC ONLY Blood Culture adequate volume   Culture   Final    NO GROWTH < 24 HOURS Performed at [facility], [address] [place], [place] [address]    Report Status PENDING  Incomplete  Culture, blood (routine x 2)     Status: None (Preliminary result)   Collection Time: 02/24/18 10:18 AM  Result Value Ref Range Status   Specimen Description BLOOD RIGHT HAND  Final   Special Requests   Final    BOTTLES DRAWN AEROBIC ONLY Blood Culture adequate volume   Culture   Final    NO GROWTH < 24 HOURS Performed at [facility], [place] [address] [place], [place] [address]    Report Status PENDING  Incomplete     Radiology Studies: Ct Abdomen Pelvis Wo Contrast  Result Date: 02/23/2018 CLINICAL DATA:  Abdominal pain after dialysis.  Nausea and vomiting. EXAM: CT ABDOMEN AND PELVIS WITHOUT CONTRAST TECHNIQUE: Multidetector CT imaging of the abdomen and pelvis was performed following the standard protocol without IV contrast. COMPARISON:  08/07/2016 FINDINGS: Lower chest: Dependent atelectasis bilaterally. Hepatobiliary: 4.5 x 5.2 cm heterogeneous low-density mass is  identified in the inferior liver, at the junction of the right and left hepatic lobes. This is right at the gallbladder fossa and contracted gallbladder with known calcified gallstones is seen immediately adjacent. Liver otherwise unremarkable on this noncontrast exam. No intrahepatic or extrahepatic biliary dilation. Pancreas: No focal mass lesion. No dilatation of the main duct. No intraparenchymal cyst. No peripancreatic edema. Spleen: No splenomegaly. No focal mass lesion. Adrenals/Urinary Tract: No adrenal nodule or mass. Right kidney unremarkable. Adjacent nonobstructing 7 mm stones are identified in the interpolar left kidney. Ureters unremarkable. Bladder is decompressed. Stomach/Bowel: Stomach is nondistended. No gastric wall thickening. No evidence of outlet obstruction. Duodenum is normally positioned as is the ligament of Treitz. No small bowel wall thickening. No small bowel dilatation. The appendix is best seen on coronal and sagittal imaging and has normal features. There is edema/inflammation around the cecum with cecal wall thickening evident (59/2). Right-sided diverticuli are noted in the cecum and ascending colon. Diverticular changes are noted in the left colon without evidence of diverticulitis. Vascular/Lymphatic: There is abdominal aortic atherosclerosis without aneurysm. There is no gastrohepatic or hepatoduodenal ligament lymphadenopathy. No intraperitoneal or retroperitoneal lymphadenopathy. No pelvic sidewall lymphadenopathy. Reproductive: The uterus has normal CT imaging appearance. There is no adnexal mass. Other: No intraperitoneal free fluid. Musculoskeletal: No worrisome lytic or sclerotic osseous abnormality. IMPRESSION: 1. Wall thickening in the cecum with pericecal edema/inflammation on a background of right-sided diverticulosis. Imaging features could be related to right-sided diverticulitis. Infectious/inflammatory right colitis also a consideration and right-sided neoplasm  cannot be entirely excluded based on this CT scan. 2. Normal terminal ileum and appendix. 3. 4.5 x 5.2 cm heterogeneous low-density lesion in the inferior liver is new since 08/07/2016. This is immediately contiguous with the gallbladder fossa. Given changes in the right colon, liver abscess would be a consideration. Primary/secondary liver neoplasm could also have this appearance and proximity to the contracted gallbladder raises the question of gallbladder origin. If renal function permits, follow-up CT or MRI with contrast would likely prove helpful to further evaluate. 4.  Aortic Atherosclerois (ICD10-170.0) Electronically Signed   By: Misty Stanley M.D.   On: 02/23/2018 15:26   Nm Gi Blood Loss  Addendum Date: 02/24/2018   ADDENDUM REPORT:  02/24/2018 19:15 ADDENDUM: Study discussed by telephone with Dr. Silverio Decamp on 02/24/2018 at 1904 hours. Electronically Signed   By: Genevie Ann M.D.   On: 02/24/2018 19:15   Result Date: 02/24/2018 CLINICAL DATA:  72 year old female with right upper quadrant pain nausea and vomiting. Onset of dark bloody stools today. EXAM: NUCLEAR MEDICINE GASTROINTESTINAL BLEEDING SCAN TECHNIQUE: Sequential abdominal images were obtained following intravenous administration of Tc-71m labeled red blood cells. RADIOPHARMACEUTICALS:  26.1 mCi Tc-66m pertechnetate in-vitro labeled red cells. COMPARISON:  CT Abdomen and Pelvis 03/05/2018 and earlier. FINDINGS: Physiologic blood pool activity demonstrated throughout the 1st hour of imaging. Toward the end of the 1st hour there is questionable faint left abdominal small bowel type activity (image 53). During the 2nd hour there is definite abnormal radiotracer activity in the left mid abdomen which appears to move through multiple closely looped segments of bowel in that region. Comparison of the large and small bowel anatomy on the coronal CT images yesterday suggests small-bowel position. IMPRESSION: Positive GI bleeding is detected during the  2nd hour of the study in the left abdomen. The morphology favors a small-bowel bleeding source. Electronically Signed: By: Genevie Ann M.D. On: 02/24/2018 18:40   Ct Abdomen Pelvis W Contrast  Result Date: 02/23/2018 CLINICAL DATA:  72 year old female with history of abdominal pain and nausea and vomiting since yesterday. EXAM: CT ABDOMEN AND PELVIS WITH CONTRAST TECHNIQUE: Multidetector CT imaging of the abdomen and pelvis was performed using the standard protocol following bolus administration of intravenous contrast. CONTRAST:  143mL OMNIPAQUE IOHEXOL 300 MG/ML  SOLN COMPARISON:  Noncontrast CT the abdomen and pelvis 02/23/2018. FINDINGS: Lower chest: Dependent areas of scarring and/or atelectasis in the lower lobes of the lungs bilaterally. Hepatobiliary: As noted on the prior study there is a large intermediate attenuation lesion in the central aspect of the liver adjacent to the gallbladder fossa. This is irregular in shape and therefore difficult to accurately measure, but this measures at least 5.2 x 4.4 cm (axial image 32 of series 3) predominantly involving segments 4B and 5. This lesion appears to have some thick internal septations. There is a smaller satellite lesion in the central aspect of segment 5 measuring 1.8 cm (axial image 34 of series 3). In addition, inferior to the lesion there are some dilated bile ducts, likely related to mass effect from the lesion centrally causing mild obstruction. In the posterior aspect of segment 6 of the liver (axial image 33 of series 3) there is an additional 11 mm lesion which is intermediate attenuation. Calcified gallstones in the gallbladder which appears nearly completely contracted. Pancreas: No pancreatic mass. No pancreatic ductal dilatation. No pancreatic or peripancreatic fluid or inflammatory changes. Spleen: Unremarkable. Adrenals/Urinary Tract: 2 nonobstructive calculi are noted in the collecting system of left kidney measuring up to 8 mm. Right kidney  and bilateral adrenal glands are normal in appearance. No hydroureteronephrosis. Urinary bladder is normal in appearance. Stomach/Bowel: Normal appearance of the stomach. No pathologic dilatation of small bowel or colon. Numerous colonic diverticulae are noted, most evident in the descending colon. Mural thickening in the region of the cecum, concerning for focal area of colitis, or potentially right-sided diverticulitis. The possibility of underlying mass is not excluded. Appendix is not confidently identified on today's examination. Vascular/Lymphatic: Aortic atherosclerosis, without evidence of aneurysm or dissection in the abdominal or pelvic vasculature. No lymphadenopathy noted in the abdomen or pelvis. Reproductive: Uterus and ovaries are unremarkable in appearance. Other: No significant volume of ascites.  No pneumoperitoneum.  Musculoskeletal: There are no aggressive appearing lytic or blastic lesions noted in the visualized portions of the skeleton. IMPRESSION: 1. Large multi-cystic lesion in the central aspect of the liver adjacent to the gallbladder fossa, with several smaller satellite lesions. These are all new compared to prior CT the chest, abdomen and pelvis 08/07/2016, concerning for intrahepatic abscesses. 2. Extensive mural thickening and inflammatory changes in the region of the cecum. This is nonspecific, and could reflect either right-sided diverticulitis, focal area of colitis, or potentially even underlying neoplasm. 3. Cholelithiasis. Gallbladder is nearly completely contracted without surrounding inflammatory changes to suggest an acute cholecystitis at this time. 4. Left-sided nephrolithiasis measuring up to 8 mm in the upper pole collecting system of left kidney. No ureteral stones or findings of urinary tract obstruction. 5. Aortic atherosclerosis. Electronically Signed   By: Vinnie Langton M.D.   On: 02/23/2018 21:23    Scheduled Meds: . Chlorhexidine Gluconate Cloth  6 each  Topical Q0600  . insulin aspart  0-9 Units Subcutaneous Q4H  . insulin aspart  5 Units Subcutaneous Once  . mouth rinse  15 mL Mouth Rinse BID  . multivitamin  1 tablet Oral QHS  . nicotine  14 mg Transdermal Q2000  . pantoprazole (PROTONIX) IV  40 mg Intravenous Q12H   Continuous Infusions: . sodium chloride    . dextrose 5 % and 0.9% NaCl 75 mL/hr at 02/24/18 2213  . piperacillin-tazobactam (ZOSYN)  IV 3.375 g (02/25/18 0530)     LOS: 2 days   Marylu Lund, MD [facility] Pager On Amion  If 7PM-7AM, please contact night-coverage 02/25/2018, 9:46 AM

## 2018-02-25 NOTE — Progress Notes (Signed)
Enteroscopy cancelled in consultation with anesthesia. Although the patient appeared to be an appropriate endoscopy candidate on arrival, while waiting, she developed altered mental status and tachycardia. Etiology of acute change in unclear. Appreciate the input of her primary team regarding further evaluation and management.  Given her positive bleeding scan last night, I would recommend a CTA for further localization +/- angio if a location is identified if she is stable enough to proceed with the exam. Continue serial hgb/hct with transfusion in the meantime.

## 2018-02-25 NOTE — Progress Notes (Signed)
When patient arrived to endoscopy department, patient was shivering and heart rate was elevated in the 120s-130s.  Temp was 97.7, blood pressure 168/84.  Patient was able to follow commands.  Glucose 95 when checked.  After further assessment from Dr. Ermalene Postin and Dr. Tarri Glenn, both agreed to reschedule when patient is back to baseline.  Informed Tory, RN.  Will continue to monitor.

## 2018-02-26 ENCOUNTER — Other Ambulatory Visit (HOSPITAL_COMMUNITY): Payer: Self-pay

## 2018-02-26 ENCOUNTER — Inpatient Hospital Stay (HOSPITAL_COMMUNITY): Payer: Medicare Other

## 2018-02-26 ENCOUNTER — Other Ambulatory Visit (HOSPITAL_COMMUNITY): Payer: Medicare Other

## 2018-02-26 DIAGNOSIS — K750 Abscess of liver: Secondary | ICD-10-CM

## 2018-02-26 DIAGNOSIS — K625 Hemorrhage of anus and rectum: Secondary | ICD-10-CM

## 2018-02-26 DIAGNOSIS — R160 Hepatomegaly, not elsewhere classified: Secondary | ICD-10-CM | POA: Insufficient documentation

## 2018-02-26 DIAGNOSIS — K5792 Diverticulitis of intestine, part unspecified, without perforation or abscess without bleeding: Secondary | ICD-10-CM

## 2018-02-26 LAB — CBC
HEMATOCRIT: 21.8 % — AB (ref 36.0–46.0)
HEMATOCRIT: 24.1 % — AB (ref 36.0–46.0)
Hemoglobin: 7 g/dL — ABNORMAL LOW (ref 12.0–15.0)
Hemoglobin: 7.4 g/dL — ABNORMAL LOW (ref 12.0–15.0)
MCH: 31.5 pg (ref 26.0–34.0)
MCH: 30.2 pg (ref 26.0–34.0)
MCHC: 32.1 g/dL (ref 30.0–36.0)
MCHC: 30.7 g/dL (ref 30.0–36.0)
MCV: 98.2 fL (ref 80.0–100.0)
MCV: 98.4 fL (ref 80.0–100.0)
NRBC: 0 % (ref 0.0–0.2)
Platelets: 139 10*3/uL — ABNORMAL LOW (ref 150–400)
Platelets: 125 10*3/uL — ABNORMAL LOW (ref 150–400)
RBC: 2.22 MIL/uL — AB (ref 3.87–5.11)
RBC: 2.45 MIL/uL — ABNORMAL LOW (ref 3.87–5.11)
RDW: 17.4 % — ABNORMAL HIGH (ref 11.5–15.5)
RDW: 17.6 % — AB (ref 11.5–15.5)
WBC: 9.7 10*3/uL (ref 4.0–10.5)
WBC: 9.7 10*3/uL (ref 4.0–10.5)
nRBC: 0 % (ref 0.0–0.2)

## 2018-02-26 LAB — RENAL FUNCTION PANEL
ANION GAP: 11 (ref 5–15)
Albumin: 1.7 g/dL — ABNORMAL LOW (ref 3.5–5.0)
BUN: 43 mg/dL — ABNORMAL HIGH (ref 8–23)
CALCIUM: 7 mg/dL — AB (ref 8.9–10.3)
CO2: 23 mmol/L (ref 22–32)
Chloride: 104 mmol/L (ref 98–111)
Creatinine, Ser: 7.68 mg/dL — ABNORMAL HIGH (ref 0.44–1.00)
GFR calc non Af Amer: 5 mL/min — ABNORMAL LOW (ref >60)
GFR, EST AFRICAN AMERICAN: 5 mL/min — AB (ref >60)
Glucose, Bld: 105 mg/dL — ABNORMAL HIGH (ref 70–99)
Phosphorus: 4.7 mg/dL — ABNORMAL HIGH (ref 2.5–4.6)
Potassium: 4.2 mmol/L (ref 3.5–5.1)
SODIUM: 138 mmol/L (ref 135–145)

## 2018-02-26 LAB — GLUCOSE, CAPILLARY
GLUCOSE-CAPILLARY: 87 mg/dL (ref 70–99)
GLUCOSE-CAPILLARY: 77 mg/dL (ref 70–99)
GLUCOSE-CAPILLARY: 107 mg/dL — AB (ref 70–99)
Glucose-Capillary: 116 mg/dL — ABNORMAL HIGH (ref 70–99)
Glucose-Capillary: 80 mg/dL (ref 70–99)
Glucose-Capillary: 81 mg/dL (ref 70–99)

## 2018-02-26 LAB — PROTIME-INR
INR: 1.17
Prothrombin Time: 14.8 seconds (ref 11.4–15.2)

## 2018-02-26 LAB — PREPARE RBC (CROSSMATCH)

## 2018-02-26 MED ORDER — IOPAMIDOL (ISOVUE-300) INJECTION 61%
INTRAVENOUS | Status: AC
  Filled 2018-02-26: qty 50

## 2018-02-26 MED ORDER — METOPROLOL TARTRATE 12.5 MG HALF TABLET
12.5000 mg | ORAL_TABLET | Freq: Two times a day (BID) | ORAL | Status: DC
  Administered 2018-02-26 – 2018-02-27 (×2): 12.5 mg via ORAL
  Filled 2018-02-26 (×3): qty 1

## 2018-02-26 MED ORDER — DEXTROSE 5 % IV SOLN
INTRAVENOUS | Status: DC
  Administered 2018-02-26 – 2018-02-28 (×3): via INTRAVENOUS

## 2018-02-26 MED ORDER — SODIUM CHLORIDE 0.9 % IV SOLN
INTRAVENOUS | Status: DC | PRN
  Administered 2018-02-26: 250 mL via INTRAVENOUS

## 2018-02-26 MED ORDER — LIDOCAINE HCL 1 % IJ SOLN
INTRAMUSCULAR | Status: AC
  Filled 2018-02-26: qty 20

## 2018-02-26 MED ORDER — MIDAZOLAM HCL 2 MG/2ML IJ SOLN
INTRAMUSCULAR | Status: AC
  Filled 2018-02-26: qty 2

## 2018-02-26 MED ORDER — SODIUM CHLORIDE 0.9% IV SOLUTION
Freq: Once | INTRAVENOUS | Status: AC
  Administered 2018-02-26: 12:00:00 via INTRAVENOUS

## 2018-02-26 MED ORDER — METOPROLOL TARTRATE 50 MG PO TABS: 50.0000 mg | ORAL_TABLET | Freq: Two times a day (BID) | ORAL | Status: DC

## 2018-02-26 MED ORDER — FENTANYL CITRATE (PF) 100 MCG/2ML IJ SOLN
INTRAMUSCULAR | Status: AC
  Filled 2018-02-26: qty 2

## 2018-02-26 MED ORDER — PANTOPRAZOLE SODIUM 40 MG PO TBEC
40.0000 mg | DELAYED_RELEASE_TABLET | Freq: Two times a day (BID) | ORAL | Status: DC
  Administered 2018-02-26 – 2018-03-06 (×16): 40 mg via ORAL
  Filled 2018-02-26 (×16): qty 1

## 2018-02-26 MED FILL — Insulin Regular (Human) Inj 100 Unit/ML: INTRAMUSCULAR | Qty: 0.05 | Status: AC

## 2018-02-26 NOTE — Progress Notes (Signed)
1 Day Post-Op    CC: Right lower quadrant pain, nausea and vomiting/bloody stools   Subjective: Events of yesterday noted, no egd, got cta that is negative and the rlq process does appear to be diverticular although not most common  Objective: Vital signs in last 24 hours: Temp:  [97.7 F (36.5 C)-101.4 F (38.6 C)] 99 F (37.2 C) (10/11 0341) Pulse Rate:  [90-119] 90 (10/11 0519) Resp:  [11-21] 16 (10/11 0519) BP: (89-168)/(47-84) 124/69 (10/11 0341) SpO2:  [89 %-100 %] 99 % (10/11 0519) Weight:  [68.8 kg] 68.8 kg (10/10 0859) Last BM Date: 02/26/18  NPO 827 IV Stool x 2 Temp up to 101.4 VSS WBC 9.7 H/H 7.4/24   Intake/Output from previous day: 10/10 0701 - 10/11 0700 In: 827.5 [I.V.:775.9; IV Piggyback:51.6] Out: -  Intake/Output this shift: No intake/output data recorded.  GI: soft tender rlq locally bs present  Lab Results:  Recent Labs    02/25/18 2316 02/26/18 0425  WBC 9.7 9.7  HGB 7.0* 7.4*  HCT 21.8* 24.1*  PLT 139* 125*    BMET Recent Labs    02/24/18 1428 02/25/18 0348  NA 132* 140  K 6.3* 4.3  CL 96* 102  CO2 19* 25  GLUCOSE 96 92  BUN 65* 34*  CREATININE 9.48* 5.71*  CALCIUM 6.6* 7.2*   PT/INR No results for input(s): LABPROT, INR in the last 72 hours.  Recent Labs  Lab 02/23/18 1224  AST 50*  ALT 36  ALKPHOS 110  BILITOT 0.5  PROT 7.0  ALBUMIN 2.8*     Lipase     Component Value Date/Time   LIPASE 35 02/23/2018 1224     Medications: . Chlorhexidine Gluconate Cloth  6 each Topical Q0600  . insulin aspart  0-9 Units Subcutaneous Q4H  . insulin aspart  5 Units Subcutaneous Once  . mouth rinse  15 mL Mouth Rinse BID  . multivitamin  1 tablet Oral QHS  . nicotine  14 mg Transdermal Q2000  . pantoprazole (PROTONIX) IV  40 mg Intravenous Q12H   Anti-infectives (From admission, onward)   Start     Dose/Rate Route Frequency Ordered Stop   02/23/18 1319  piperacillin-tazobactam (ZOSYN) IVPB 3.375 g     3.375 g 12.5  mL/hr over 240 Minutes Intravenous Every 12 hours 02/23/18 1319        Assessment/Plan  End-stage renal disease on dialysis Hypertension Ongoing tobacco use Hyperlipidemia  Right lower quadrant pain Possible diverticulitis vs colitis vs neoplasm of the cecum - GI following here - Colonoscopy in [place] 3-4 years ago Cystic lesions of the liver/possible liver abscess - IR consult pending I think reasonable to continue abx and follow with drainage of liver. Im not sure she isnt just going to end up needing surgery as not 100% sure this is diverticular. She is tender but much better than 48 hours ago.  She continues to have some bleeding that was small bowel on nm scan but cta negative. Will continue to follow also. FEN:  IV fluids/NPO ID:  Zosyn 10/8 =>> day 4 DVT:  SCD - Bloody stools   LOS: 3 days    JENNINGS,WILLARD 02/26/2018 [phone]

## 2018-02-26 NOTE — Progress Notes (Signed)
PROGRESS NOTE    Mary Chapman  WNU:[id number] DOB: Sep 29, 1945 DOA: 02/23/2018 PCP: [facility]    Brief Narrative:  72 y.o. female with medical history significant of h/o ESRD on HD due to FSGS; HTN; and tobacco dependence presenting with abdominal pain.   She developed abdominal pain, she usually has a high pain tolerance and she couldn't stand it anymore.  It was painful all night.  The pain started yesterday about noon.  She went to HD and felt ok.  RLQ pain mostly, although the pain is also diffuse.  It came on acutely and worsened.  +nausea and vomiting, starting last night and also this AM.  Last emesis was about 2pm.  Last BM was last evening and was loose; she had a total of 3-4 loose BMs yesterday (but she often has loose stools while on HD).  No fevers, no chills.  Her last colonoscopy was 3-4 years ago at [facility]; it was normal, to her knowledge.   ED Course: Pain started yesterday and worsened. WBC 20k. No fever, normal lactate. CT with cecal inflammation and diverticular changes - ?R-sided diverticulitis. Cannot r/o mass. Also with liver changes - abscess, neoplasm on ddx. Suggest MRI or CT with contrast. She was given Zosyn. Needs admission for intraabdominal infection. Surgery has not been called.  Assessment & Plan:   Principal Problem:   Diverticulitis Active Problems:   Tobacco abuse   Essential hypertension   Focal segmental glomerulosclerosis   Hyperlipidemia   ESRD on dialysis ([place])  Intraabdominal infection - Proximal diverticulitis, Liver abscess -Patient developed acute onset of RLQ pain prior to admit with n/v, no fever, +leukocytosis -CT personally reviewed. Findings suggestive of diverticulitis at level of cecum with findings of 4-5cm fluid collections in liver c/w liver abscess -General Surgery consulted, recs noted. Continue IV abx -Tentative plans for drainage of liver lesion on 10/11 noted -GI consulted and is  following  ESRD on HD, due to FSGS -Patient on chronic MWF HD -Nephrology following -Continuing HD per Nephrology  HTN -BP now improving off BP meds -Have resumed metoprolol at lower dose. Plan to increase dose as tolerated  Acute blood loss anemia -Earlier, patient with large dark stools with resultant drop in 2-3 g of hgb -Hgb down to 7.0 overnight, receiving 2 units PRBC on HD today -GI following. Did not do EGD 10/10 secondary to instability -Recent bleeding scan with evidence of bleeding in small bowels -CT with no acute bleed noted -Follow up on GI recs -Repeat CBC in AM  Tobacco abuse -Cessation was encouraged -Stable at this time  Tachycardia -Improved overnight -Suspect related to worsened anemia vs rebound tachycardia after holding metoprolol following recent hemorrhagic shock -SBP currently in the 150's. Will resume metoprolol, albeit at lower dose -Will continue on PRN IV labetalol for HR>120  DVT prophylaxis: SCD Code Status: Full Family Communication: Pt in room, family not at bedside Disposition Plan: Uncertain at this time  Consultants:   GI  General Surgery  IR  Procedures:     Antimicrobials: Anti-infectives (From admission, onward)   Start     Dose/Rate Route Frequency Ordered Stop   02/23/18 1319  piperacillin-tazobactam (ZOSYN) IVPB 3.375 g     3.375 g 12.5 mL/hr over 240 Minutes Intravenous Every 12 hours 02/23/18 1319        Subjective: Without complaints at this time. Seen on HD  Objective: Vitals:   02/26/18 1330 02/26/18 1345 02/26/18 1410 02/26/18 1417  BP: (!) 153/73 Marland Kitchen)  153/73 (!) 153/80 (!) 153/80  Pulse: (!) 111 (!) 111 (!) 110 (!) 110  Resp:  20 18 18   Temp: 98 F (36.7 C) 98 F (36.7 C) 99.1 F (37.3 C) 99.1 F (37.3 C)  TempSrc: Oral Oral Oral Oral  SpO2:  98% 100% 100%  Weight:      Height:        Intake/Output Summary (Last 24 hours) at 02/26/2018 1438 Last data filed at 02/26/2018 0645 Gross per 24  hour  Intake 827.48 ml  Output -  Net 827.48 ml   Filed Weights   02/25/18 0245 02/25/18 0859 02/26/18 1025  Weight: 65.5 kg 68.8 kg 67.1 kg    Examination: General exam: Conversant, in no acute distress Respiratory system: normal chest rise, clear, no audible wheezing Cardiovascular system: regular rhythm, s1-s2 Gastrointestinal system: generally tender, pos BS Central nervous system: No seizures, no tremors Extremities: No cyanosis, no joint deformities Skin: No rashes, no pallor Psychiatry: Affect normal // no auditory hallucinations   Data Reviewed: I have personally reviewed following labs and imaging studies  CBC: Recent Labs  Lab 02/23/18 1224 02/24/18 0521 02/24/18 1142 02/24/18 1924 02/25/18 0348 02/25/18 2316 02/26/18 0425  WBC 20.3* 13.4* 14.6*  --  10.6* 9.7 9.7  NEUTROABS 18.3*  --   --   --   --   --   --   HGB 13.0 11.7* 9.9* 8.9* 8.1* 7.0* 7.4*  HCT 39.2 36.1 33.2* 27.3* 25.4* 21.8* 24.1*  MCV 95.1 96.3 100.6*  --  96.9 98.2 98.4  PLT 201 171 162  --  147* 139* 676*   Basic Metabolic Panel: Recent Labs  Lab 02/23/18 1224 02/24/18 0521 02/24/18 1428 02/25/18 0348 02/26/18 1000  NA 135 130* 132* 140 138  K 6.0* 6.4* 6.3* 4.3 4.2  CL 92* 92* 96* 102 104  CO2 28 23 19* 25 23  GLUCOSE 95 50* 96 92 105*  BUN 39* 53* 65* 34* 43*  CREATININE 7.79* 9.07* 9.48* 5.71* 7.68*  CALCIUM 7.6* 6.5* 6.6* 7.2* 7.0*  MG  --   --   --  1.9  --   PHOS  --   --   --   --  4.7*   GFR: Estimated Creatinine Clearance: 6.3 mL/min (A) (by C-G formula based on SCr of 7.68 mg/dL (H)). Liver Function Tests: Recent Labs  Lab 02/23/18 1224 02/26/18 1000  AST 50*  --   ALT 36  --   ALKPHOS 110  --   BILITOT 0.5  --   PROT 7.0  --   ALBUMIN 2.8* 1.7*   Recent Labs  Lab 02/23/18 1224  LIPASE 35   No results for input(s): AMMONIA in the last 168 hours. Coagulation Profile: No results for input(s): INR, PROTIME in the last 168 hours. Cardiac Enzymes: No  results for input(s): CKTOTAL, CKMB, CKMBINDEX, TROPONINI in the last 168 hours. BNP (last 3 results) No results for input(s): PROBNP in the last 8760 hours. HbA1C: No results for input(s): HGBA1C in the last 72 hours. CBG: Recent Labs  Lab 02/25/18 2103 02/26/18 0010 02/26/18 0448 02/26/18 0804 02/26/18 1225  GLUCAP 92 116* 81 80 87   Lipid Profile: No results for input(s): CHOL, HDL, LDLCALC, TRIG, CHOLHDL, LDLDIRECT in the last 72 hours. Thyroid Function Tests: No results for input(s): TSH, T4TOTAL, FREET4, T3FREE, THYROIDAB in the last 72 hours. Anemia Panel: No results for input(s): VITAMINB12, FOLATE, FERRITIN, TIBC, IRON, RETICCTPCT in the last 72 hours. Sepsis Labs:  Recent Labs  Lab 02/23/18 1415  LATICACIDVEN 1.76    Recent Results (from the past 240 hour(s))  MRSA PCR Screening     Status: None   Collection Time: 02/24/18 12:19 AM  Result Value Ref Range Status   MRSA by PCR NEGATIVE NEGATIVE Final    Comment:        The GeneXpert MRSA Assay (FDA approved for NASAL specimens only), is one component of a comprehensive MRSA colonization surveillance program. It is not intended to diagnose MRSA infection nor to guide or monitor treatment for MRSA infections. Performed at [facility], [place] [address] [place], [place] [address]   Culture, blood (routine x 2)     Status: None (Preliminary result)   Collection Time: 02/24/18 10:15 AM  Result Value Ref Range Status   Specimen Description BLOOD RIGHT HAND  Final   Special Requests   Final    BOTTLES DRAWN AEROBIC ONLY Blood Culture adequate volume   Culture   Final    NO GROWTH 2 DAYS Performed at [facility], [place] [address], [place], [place] [address]    Report Status PENDING  Incomplete  Culture, blood (routine x 2)     Status: None (Preliminary result)   Collection Time: 02/24/18 10:18 AM  Result Value Ref Range Status   Specimen Description BLOOD RIGHT HAND  Final   Special Requests    Final    BOTTLES DRAWN AEROBIC ONLY Blood Culture adequate volume   Culture   Final    NO GROWTH 2 DAYS Performed at [facility], [place] [address], [place], [place] [address]    Report Status PENDING  Incomplete     Radiology Studies: Nm Gi Blood Loss  Addendum Date: 02/24/2018   ADDENDUM REPORT: 02/24/2018 19:15 ADDENDUM: Study discussed by telephone with Dr. Silverio Decamp on 02/24/2018 at 1904 hours. Electronically Signed   By: Genevie Ann M.D.   On: 02/24/2018 19:15   Result Date: 02/24/2018 CLINICAL DATA:  72 year old female with right upper quadrant pain nausea and vomiting. Onset of dark bloody stools today. EXAM: NUCLEAR MEDICINE GASTROINTESTINAL BLEEDING SCAN TECHNIQUE: Sequential abdominal images were obtained following intravenous administration of Tc-16m labeled red blood cells. RADIOPHARMACEUTICALS:  26.1 mCi Tc-35m pertechnetate in-vitro labeled red cells. COMPARISON:  CT Abdomen and Pelvis 03/05/2018 and earlier. FINDINGS: Physiologic blood pool activity demonstrated throughout the 1st hour of imaging. Toward the end of the 1st hour there is questionable faint left abdominal small bowel type activity (image 53). During the 2nd hour there is definite abnormal radiotracer activity in the left mid abdomen which appears to move through multiple closely looped segments of bowel in that region. Comparison of the large and small bowel anatomy on the coronal CT images yesterday suggests small-bowel position. IMPRESSION: Positive GI bleeding is detected during the 2nd hour of the study in the left abdomen. The morphology favors a small-bowel bleeding source. Electronically Signed: By: Genevie Ann M.D. On: 02/24/2018 18:40   Ct Angio Abd/pel W/ And/or W/o  Result Date: 02/25/2018 CLINICAL DATA:  Active GI bleeding. EXAM: CTA ABDOMEN AND PELVIS wITHOUT AND WITH CONTRAST TECHNIQUE: Multidetector CT imaging of the abdomen and pelvis was performed using the standard protocol during bolus administration of  intravenous contrast. Multiplanar reconstructed images and MIPs were obtained and reviewed to evaluate the vascular anatomy. CONTRAST:  150mL ISOVUE-370 IOPAMIDOL (ISOVUE-370) INJECTION 76% COMPARISON:  CT, 02/23/2018. FINDINGS: VASCULAR Aorta: Normal in caliber. There is atherosclerosis throughout the aorta with no  significant stenosis. Celiac: Minor plaque at the origin.  No significant stenosis. SMA: Mild plaque at the origin.  No significant stenosis. Renals: Mild plaque just beyond the origin on the left. Minimal plaque just beyond the origin on the right. No significant stenosis. IMA: Patent without evidence of aneurysm, dissection, vasculitis or significant stenosis. Inflow: Atherosclerotic changes along the common iliac and internal iliac arteries. No significant stenosis. Mild dilation of the right common iliac artery to 14 mm. Proximal Outflow: Mild plaque involving both common femoral arteries without significant stenosis. Veins: Patent. Review of the MIP images confirms the above findings. NON-VASCULAR Lower chest: Lower lobe dependent atelectasis. No convincing pneumonia. No pulmonary edema. Heart is mildly enlarged. Hepatobiliary: Heterogeneous cystic appearing mass in the right lobe is unchanged from the study performed 2 days ago. There are 2 smaller adjacent masses that lie posterior and inferior to the dominant right lobe mass. No other liver lesions. There is mild intrahepatic bile duct dilation in the upper right lobe, segment 7. Gallbladder is mostly collapsed. There are gallstones. No bile duct dilation. Pancreas: Unremarkable. No pancreatic ductal dilatation or surrounding inflammatory changes. Spleen: Normal in size without focal abnormality. Adrenals/Urinary Tract: No adrenal masses. Kidneys are normal in size, orientation and position with symmetric enhancement. There are 2 adjacent nonobstructing stones in the left kidney at the midpole. No renal masses. No hydronephrosis. Normal ureters.  Bladder is mostly collapsed. It contains contrast enhanced urine. Stomach/Bowel: As noted on the prior CT, there is a cecal mass versus eccentric wall thickening. Hazy inflammation is now noted adjacent to this. There are several diverticula. Findings support right colon diverticulitis. No abscess. No extraluminal air. There is no evidence of arterial extravasation of contrast into the right colon, or elsewhere in the bowel, to indicate an active bleeding source. Right colon is mildly distended. No other areas of wall thickening. No other evidence of a mass. There are additional colonic diverticula with no other inflammatory changes. Stomach and small bowel unremarkable. Normal appendix visualized. Lymphatic: Mildly prominent gastrohepatic ligament lymph nodes, none pathologically enlarged. Reproductive: Uterus and bilateral adnexa are unremarkable. Other: No abdominal wall hernia or abnormality. No abdominopelvic ascites. Musculoskeletal: No fracture or acute finding. No osteoblastic or osteolytic lesions. IMPRESSION: VASCULAR 1. Atherosclerotic changes as described without significant stenosis. 2. No evidence of an active arterial bleeding source in the bowel. NON-VASCULAR 1. Findings are similar to the prior CT. There is eccentric wall thickening versus a possible mass in the cecum. There are associated diverticula and adjacent inflammation. Findings are most likely due to diverticulitis. No extraluminal air or abscess. There is no visualized arterial bleeding, although this is likely the source of GI bleeding. 2. Right liver lobe masses as described on the prior CT. Differential diagnosis includes liver abscesses. 3. No other acute abnormality in the abdomen and pelvis. 4. There are other colonic diverticula.  No other diverticulitis. 5. Left intrarenal stones.  Gallstones. 6. Aortic atherosclerosis. Electronically Signed   By: Lajean Manes M.D.   On: 02/25/2018 18:43    Scheduled Meds: . insulin aspart   0-9 Units Subcutaneous Q4H  . insulin aspart  5 Units Subcutaneous Once  . mouth rinse  15 mL Mouth Rinse BID  . metoprolol tartrate  12.5 mg Oral BID  . multivitamin  1 tablet Oral QHS  . nicotine  14 mg Transdermal Q2000  . pantoprazole (PROTONIX) IV  40 mg Intravenous Q12H   Continuous Infusions: . sodium chloride Stopped (02/26/18 1950)  . dextrose 40  mL/hr at 02/26/18 1216  . piperacillin-tazobactam (ZOSYN)  IV 12.5 mL/hr at 02/26/18 0645     LOS: 3 days   Marylu Lund, MD [facility] Pager On Amion  If 7PM-7AM, please contact night-coverage 02/26/2018, 2:38 PM

## 2018-02-26 NOTE — Sedation Documentation (Signed)
Patient denies pain and is resting comfortably.  

## 2018-02-26 NOTE — Sedation Documentation (Signed)
Procedure aborted, no INR

## 2018-02-26 NOTE — Care Management Important Message (Signed)
Important Message  Patient Details  Name: Mary Chapman MRN: [id number] Date of Birth: 1945/09/26   Medicare Important Message Given:       Orbie Pyo 02/26/2018, 1:04 PM

## 2018-02-26 NOTE — Progress Notes (Addendum)
Daily Rounding Note  02/26/2018, 2:13 PM  LOS: 3 days   SUBJECTIVE:   Chief complaint: Bloody stool.    No further bloody stools today.  Denies abdominal pain.  Denies nausea.  Wants to go home.  Currently undergoing hemodialysis.  OBJECTIVE:         Vital signs in last 24 hours:    Temp:  [98 F (36.7 C)-101.4 F (38.6 C)] (P) 99.1 F (37.3 C) (10/11 1400) Pulse Rate:  [90-118] (P) 114 (10/11 1400) Resp:  [11-21] (P) 18 (10/11 1400) BP: (89-185)/(47-87) (P) 153/80 (10/11 1400) SpO2:  [89 %-100 %] (P) 100 % (10/11 1400) Weight:  [67.1 kg] 67.1 kg (10/11 1025) Last BM Date: 02/26/18 Filed Weights   02/25/18 0245 02/25/18 0859 02/26/18 1025  Weight: 65.5 kg 68.8 kg 67.1 kg   General: Looks tired but not acutely ill Heart: Regular, tachycardic at 100 bpm.. Chest: Clear bilaterally, no labored breathing or cough. Abdomen: Soft.  Nontender or distended.  Active bowel sounds. Extremities: CCE. Neuro/Psych: Follows commands.  Drowsy but arousable.  Gross deficits.  No tremor or weakness.  Intake/Output from previous day: 10/10 0701 - 10/11 0700 In: 827.5 [I.V.:775.9; IV Piggyback:51.6] Out: -   Intake/Output this shift: No intake/output data recorded.  Lab Results: Recent Labs    02/25/18 0348 02/25/18 2316 02/26/18 0425  WBC 10.6* 9.7 9.7  HGB 8.1* 7.0* 7.4*  HCT 25.4* 21.8* 24.1*  PLT 147* 139* 125*   BMET Recent Labs    02/24/18 1428 02/25/18 0348 02/26/18 1000  NA 132* 140 138  K 6.3* 4.3 4.2  CL 96* 102 104  CO2 19* 25 23  GLUCOSE 96 92 105*  BUN 65* 34* 43*  CREATININE 9.48* 5.71* 7.68*  CALCIUM 6.6* 7.2* 7.0*   LFT Recent Labs    02/26/18 1000  ALBUMIN 1.7*   PT/INR No results for input(s): LABPROT, INR in the last 72 hours. Hepatitis Panel No results for input(s): HEPBSAG, HCVAB, HEPAIGM, HEPBIGM in the last 72 hours.  Studies/Results: Nm Gi Blood Loss  Addendum Date:  02/24/2018   ADDENDUM REPORT: 02/24/2018 19:15 ADDENDUM: Study discussed by telephone with Dr. Silverio Decamp on 02/24/2018 at 1904 hours. Electronically Signed   By: Genevie Ann M.D.   On: 02/24/2018 19:15   Result Date: 02/24/2018 CLINICAL DATA:  72 year old female with right upper quadrant pain nausea and vomiting. Onset of dark bloody stools today. EXAM: NUCLEAR MEDICINE GASTROINTESTINAL BLEEDING SCAN TECHNIQUE: Sequential abdominal images were obtained following intravenous administration of Tc-20m labeled red blood cells. RADIOPHARMACEUTICALS:  26.1 mCi Tc-71m pertechnetate in-vitro labeled red cells. COMPARISON:  CT Abdomen and Pelvis 03/05/2018 and earlier. FINDINGS: Physiologic blood pool activity demonstrated throughout the 1st hour of imaging. Toward the end of the 1st hour there is questionable faint left abdominal small bowel type activity (image 53). During the 2nd hour there is definite abnormal radiotracer activity in the left mid abdomen which appears to move through multiple closely looped segments of bowel in that region. Comparison of the large and small bowel anatomy on the coronal CT images yesterday suggests small-bowel position. IMPRESSION: Positive GI bleeding is detected during the 2nd hour of the study in the left abdomen. The morphology favors a small-bowel bleeding source. Electronically Signed: By: Genevie Ann M.D. On: 02/24/2018 18:40   Ct Angio Abd/pel W/ And/or W/o  Result Date: 02/25/2018 CLINICAL DATA:  Active GI bleeding. EXAM: CTA ABDOMEN AND PELVIS wITHOUT AND WITH CONTRAST  TECHNIQUE: Multidetector CT imaging of the abdomen and pelvis was performed using the standard protocol during bolus administration of intravenous contrast. Multiplanar reconstructed images and MIPs were obtained and reviewed to evaluate the vascular anatomy. CONTRAST:  124mL ISOVUE-370 IOPAMIDOL (ISOVUE-370) INJECTION 76% COMPARISON:  CT, 02/23/2018. FINDINGS: VASCULAR Aorta: Normal in caliber. There is  atherosclerosis throughout the aorta with no significant stenosis. Celiac: Minor plaque at the origin.  No significant stenosis. SMA: Mild plaque at the origin.  No significant stenosis. Renals: Mild plaque just beyond the origin on the left. Minimal plaque just beyond the origin on the right. No significant stenosis. IMA: Patent without evidence of aneurysm, dissection, vasculitis or significant stenosis. Inflow: Atherosclerotic changes along the common iliac and internal iliac arteries. No significant stenosis. Mild dilation of the right common iliac artery to 14 mm. Proximal Outflow: Mild plaque involving both common femoral arteries without significant stenosis. Veins: Patent. Review of the MIP images confirms the above findings. NON-VASCULAR Lower chest: Lower lobe dependent atelectasis. No convincing pneumonia. No pulmonary edema. Heart is mildly enlarged. Hepatobiliary: Heterogeneous cystic appearing mass in the right lobe is unchanged from the study performed 2 days ago. There are 2 smaller adjacent masses that lie posterior and inferior to the dominant right lobe mass. No other liver lesions. There is mild intrahepatic bile duct dilation in the upper right lobe, segment 7. Gallbladder is mostly collapsed. There are gallstones. No bile duct dilation. Pancreas: Unremarkable. No pancreatic ductal dilatation or surrounding inflammatory changes. Spleen: Normal in size without focal abnormality. Adrenals/Urinary Tract: No adrenal masses. Kidneys are normal in size, orientation and position with symmetric enhancement. There are 2 adjacent nonobstructing stones in the left kidney at the midpole. No renal masses. No hydronephrosis. Normal ureters. Bladder is mostly collapsed. It contains contrast enhanced urine. Stomach/Bowel: As noted on the prior CT, there is a cecal mass versus eccentric wall thickening. Hazy inflammation is now noted adjacent to this. There are several diverticula. Findings support right colon  diverticulitis. No abscess. No extraluminal air. There is no evidence of arterial extravasation of contrast into the right colon, or elsewhere in the bowel, to indicate an active bleeding source. Right colon is mildly distended. No other areas of wall thickening. No other evidence of a mass. There are additional colonic diverticula with no other inflammatory changes. Stomach and small bowel unremarkable. Normal appendix visualized. Lymphatic: Mildly prominent gastrohepatic ligament lymph nodes, none pathologically enlarged. Reproductive: Uterus and bilateral adnexa are unremarkable. Other: No abdominal wall hernia or abnormality. No abdominopelvic ascites. Musculoskeletal: No fracture or acute finding. No osteoblastic or osteolytic lesions. IMPRESSION: VASCULAR 1. Atherosclerotic changes as described without significant stenosis. 2. No evidence of an active arterial bleeding source in the bowel. NON-VASCULAR 1. Findings are similar to the prior CT. There is eccentric wall thickening versus a possible mass in the cecum. There are associated diverticula and adjacent inflammation. Findings are most likely due to diverticulitis. No extraluminal air or abscess. There is no visualized arterial bleeding, although this is likely the source of GI bleeding. 2. Right liver lobe masses as described on the prior CT. Differential diagnosis includes liver abscesses. 3. No other acute abnormality in the abdomen and pelvis. 4. There are other colonic diverticula.  No other diverticulitis. 5. Left intrarenal stones.  Gallstones. 6. Aortic atherosclerosis. Electronically Signed   By: Lajean Manes M.D.   On: 02/25/2018 18:43   Scheduled Meds: . insulin aspart  0-9 Units Subcutaneous Q4H  . insulin aspart  5 Units Subcutaneous  Once  . mouth rinse  15 mL Mouth Rinse BID  . multivitamin  1 tablet Oral QHS  . nicotine  14 mg Transdermal Q2000  . pantoprazole (PROTONIX) IV  40 mg Intravenous Q12H   Continuous Infusions: . sodium  chloride Stopped (02/26/18 3704)  . dextrose 40 mL/hr at 02/26/18 1216  . piperacillin-tazobactam (ZOSYN)  IV 12.5 mL/hr at 02/26/18 0645   PRN Meds:.sodium chloride, acetaminophen **OR** acetaminophen, calcium carbonate (dosed in mg elemental calcium), camphor-menthol **AND** hydrOXYzine, docusate sodium, feeding supplement (NEPRO CARB STEADY), HYDROmorphone (DILAUDID) injection, labetalol, sorbitol, zolpidem   ASSESMENT:   *    Acute GI bleed.  Dark emesis. On IV Protonix twice daily. Nuclear medicine bleeding scan 10/9: Bleeding at second hour in the left abdomen, favoring small bowel source. CTA 10/10: Eccentric cecal wall thickening, possibly a mass with associated diverticula and adjacent inflammation.  Findings most likely due to diverticulitis.  No visualized bleeding although the cecal findings are most likely source of bleeding.  Liver masses in the right lobe,  differential includes abscesses.  Other diverticula within the colon.  Gallstones and left intra-renal stones.  Aortic atherosclerosis. Enteroscopy canceled 10/10 due to acute altered mental status, tachycardia.  *    Acute blood loss anemia.  2 units PRBCs has begun transfusing 1345 today.  *    Cystic liver lesions, concerning for abscesses.  *    Cecal diverticulitis versus focal colitis, unable to rule out underlying neoplasia. Zosyn day 4.  *    Noncritical thrombocytopenia.  *    ESRD.  Hemodialysis MWF.  PLAN   *   Note orders for CT-guided drainage of liver abscesses versus biopsy placed by Dr Wyline Copas.    *   Switch to oral Protonix.   Resume p.o.'s, start with clear liquids.    Azucena Freed  02/26/2018, 2:13 PM Phone [phone]  Attending Attestation:  I have independently seen and evaluated the patient today. No family was present at the time of my evaluation. I discussed the patient's care with PA Gribbin and I agree with her documentation below.   No additional overt GI bleeding. CTA negative  yesterday for a source. No endoscopy planned at this time in the setting of acute diverticulitis. Will repeat tagged bleeding scan with any additional, significant overt GI bleeding. Continue serial hgb/hct with transfusion as indicated.  Continue IV antibiotics. Awaiting IR drainage of liver lesions. Cecal mass appears more like diverticulitis on CTA yesterday. Will need colonoscopy when acute infection resolves.   Thornton Park, MD, MPH [facility]

## 2018-02-26 NOTE — Sedation Documentation (Signed)
Called report to 5N nurse. Nurse aware of INR ordered stat, nop after midnight for procedure tomorrow 02/27/18

## 2018-02-26 NOTE — Progress Notes (Addendum)
[facility] Progress Note  Subjective: Hb down 7.3 today, pt still having RLQ pain, no other new c/o's.  Didn't get EGD yest due to instability.  CTA of bowel neg for active bleed.   Vitals:   02/26/18 0025 02/26/18 0341 02/26/18 0519 02/26/18 0845  BP:  124/69  128/68  Pulse: (!) 101 90 90 99  Resp: 17 16 16 18   Temp: 99.3 F (37.4 C) 99 F (37.2 C)  99.3 F (37.4 C)  TempSrc: Oral Oral  Oral  SpO2: 96% 98% 99% 100%  Weight:      Height:        Inpatient medications: . sodium chloride   Intravenous Once  . insulin aspart  0-9 Units Subcutaneous Q4H  . insulin aspart  5 Units Subcutaneous Once  . mouth rinse  15 mL Mouth Rinse BID  . multivitamin  1 tablet Oral QHS  . nicotine  14 mg Transdermal Q2000  . pantoprazole (PROTONIX) IV  40 mg Intravenous Q12H   . sodium chloride Stopped (02/26/18 0240)  . dextrose    . piperacillin-tazobactam (ZOSYN)  IV 12.5 mL/hr at 02/26/18 0645   sodium chloride, acetaminophen **OR** acetaminophen, calcium carbonate (dosed in mg elemental calcium), camphor-menthol **AND** hydrOXYzine, docusate sodium, feeding supplement (NEPRO CARB STEADY), HYDROmorphone (DILAUDID) injection, labetalol, sorbitol, zolpidem  Iron/TIBC/Ferritin/ %Sat    Component Value Date/Time   IRON 92 03/15/2017 1331   TIBC 175 (L) 03/15/2017 1331   FERRITIN 206 08/10/2016 0204   IRONPCTSAT 53 (H) 03/15/2017 1331    Exam: Gen alert, nasal O2 No jvd or bruits Chest clear bilat to bases RRR no MRG  Abd tender RLQ, normal BS, no ascites or hsm Ext no LE edema Neuro is alert, Ox 3 , nf L arm AVF+bruit    Home meds:  - metoprolol 50 bid  - MVI/ nicotine patch/ vitamins   Dialysis: MWF [facility]  4h  64.7kg  2/2.5 bath   L AVF   Hep 4000 + 2000 midrun  - hect 2 ug  - venofer 50/wk   Impression/ Plan 1. RLQ abd pain/ N/V - per primary, possible diverticulitis vs other, on IV abx 2. GI bleed/ anemia of ABL - Hb 11 > 7's  today. SP +nuclear scan then yesterday negative CTA of bowel for bleed. Will give 2u prbc on HD today.  3. Liver lesion - abscess vs mass, per GI/ IR / primary 4. ESRD - HD MWF.  HD today.  5. Volume - up 3kg today, BP's soft, keep even on HD today. DC'd D5NS IVF's, replaced w/ d5w at 40/hr for BS support.  6. HTN - holding metop 7. Smoker 8. Anemia ckd - no esa, Hb 11 9. MBD ckd - cont meds, Ca low, phos pnd   [name] MD [place] pager [phone]   02/26/2018, 10:14 AM   Recent Labs  Lab 02/23/18 1224  02/24/18 1428 02/25/18 0348  NA 135   < > 132* 140  K 6.0*   < > 6.3* 4.3  CL 92*   < > 96* 102  CO2 28   < > 19* 25  GLUCOSE 95   < > 96 92  BUN 39*   < > 65* 34*  CREATININE 7.79*   < > 9.48* 5.71*  CALCIUM 7.6*   < > 6.6* 7.2*  ALBUMIN 2.8*  --   --   --    < > = values in this interval not  displayed.   Recent Labs  Lab 02/23/18 1224  AST 50*  ALT 36  ALKPHOS 110  BILITOT 0.5  PROT 7.0   Recent Labs  Lab 02/23/18 1224  02/25/18 2316 02/26/18 0425  WBC 20.3*   < > 9.7 9.7  NEUTROABS 18.3*  --   --   --   HGB 13.0   < > 7.0* 7.4*  HCT 39.2   < > 21.8* 24.1*  MCV 95.1   < > 98.2 98.4  PLT 201   < > 139* 125*   < > = values in this interval not displayed.

## 2018-02-26 NOTE — Progress Notes (Signed)
Patient ID: Mary Chapman, female   DOB: 04/22/1946, 72 y.o.   MRN: [id number] Patient brought to IR for image guided aspiration/drainage/biopsy of the liver lesion.  Unfortunately, we discovered that PT/INR lab order was discontinued and never done.  I looked at the liver with [place] and the liver lesion is most compatible with a mass rather than an abscess.  We will plan to sample the liver lesion after patient gets appropriate labs.

## 2018-02-26 NOTE — Progress Notes (Signed)
Pt febrile, Oral Temp 102.2. PRN tylenol 650 mg given, to recheck Temp. BC drawn on 02/24/18 NGTD. Provider notified

## 2018-02-27 ENCOUNTER — Inpatient Hospital Stay (HOSPITAL_COMMUNITY): Payer: Medicare Other

## 2018-02-27 LAB — GLUCOSE, CAPILLARY
Glucose-Capillary: 102 mg/dL — ABNORMAL HIGH (ref 70–99)
Glucose-Capillary: 102 mg/dL — ABNORMAL HIGH (ref 70–99)
Glucose-Capillary: 125 mg/dL — ABNORMAL HIGH (ref 70–99)
Glucose-Capillary: 83 mg/dL (ref 70–99)
Glucose-Capillary: 86 mg/dL (ref 70–99)
Glucose-Capillary: 88 mg/dL (ref 70–99)
Glucose-Capillary: 97 mg/dL (ref 70–99)

## 2018-02-27 LAB — BPAM RBC
Blood Product Expiration Date: 201911042359
Blood Product Expiration Date: 201911052359
ISSUE DATE / TIME: 201910111348
ISSUE DATE / TIME: 201910111348
UNIT TYPE AND RH: 5100
Unit Type and Rh: 5100

## 2018-02-27 LAB — CBC
HCT: 25.9 % — ABNORMAL LOW (ref 36.0–46.0)
HEMATOCRIT: 28.4 % — AB (ref 36.0–46.0)
HEMATOCRIT: 31.4 % — AB (ref 36.0–46.0)
HEMOGLOBIN: 8 g/dL — AB (ref 12.0–15.0)
HEMOGLOBIN: 9.2 g/dL — AB (ref 12.0–15.0)
HEMOGLOBIN: 10.4 g/dL — AB (ref 12.0–15.0)
MCH: 30.1 pg (ref 26.0–34.0)
MCH: 30.6 pg (ref 26.0–34.0)
MCH: 31 pg (ref 26.0–34.0)
MCHC: 30.9 g/dL (ref 30.0–36.0)
MCHC: 32.4 g/dL (ref 30.0–36.0)
MCHC: 33.1 g/dL (ref 30.0–36.0)
MCV: 97.4 fL (ref 80.0–100.0)
MCV: 94.4 fL (ref 80.0–100.0)
MCV: 93.5 fL (ref 80.0–100.0)
Platelets: 95 10*3/uL — ABNORMAL LOW (ref 150–400)
Platelets: 132 10*3/uL — ABNORMAL LOW (ref 150–400)
Platelets: 146 10*3/uL — ABNORMAL LOW (ref 150–400)
RBC: 2.66 MIL/uL — AB (ref 3.87–5.11)
RBC: 3.01 MIL/uL — ABNORMAL LOW (ref 3.87–5.11)
RBC: 3.36 MIL/uL — ABNORMAL LOW (ref 3.87–5.11)
RDW: 18.3 % — ABNORMAL HIGH (ref 11.5–15.5)
RDW: 17.6 % — ABNORMAL HIGH (ref 11.5–15.5)
RDW: 17 % — ABNORMAL HIGH (ref 11.5–15.5)
WBC: 9.3 10*3/uL (ref 4.0–10.5)
WBC: 10.6 10*3/uL — ABNORMAL HIGH (ref 4.0–10.5)
WBC: 11.5 10*3/uL — ABNORMAL HIGH (ref 4.0–10.5)
nRBC: 0 % (ref 0.0–0.2)
nRBC: 0 % (ref 0.0–0.2)
nRBC: 0 % (ref 0.0–0.2)

## 2018-02-27 LAB — BASIC METABOLIC PANEL
Anion gap: 11 (ref 5–15)
BUN: 12 mg/dL (ref 8–23)
CO2: 26 mmol/L (ref 22–32)
CREATININE: 4.15 mg/dL — AB (ref 0.44–1.00)
Calcium: 7.3 mg/dL — ABNORMAL LOW (ref 8.9–10.3)
Chloride: 97 mmol/L — ABNORMAL LOW (ref 98–111)
GFR calc Af Amer: 11 mL/min — ABNORMAL LOW (ref >60)
GFR calc non Af Amer: 10 mL/min — ABNORMAL LOW (ref >60)
GLUCOSE: 107 mg/dL — AB (ref 70–99)
POTASSIUM: 3.3 mmol/L — AB (ref 3.5–5.1)
Sodium: 134 mmol/L — ABNORMAL LOW (ref 135–145)

## 2018-02-27 LAB — TYPE AND SCREEN
ABO/RH(D): O POS
Antibody Screen: NEGATIVE
UNIT DIVISION: 0
Unit division: 0

## 2018-02-27 MED ORDER — POTASSIUM CHLORIDE CRYS ER 20 MEQ PO TBCR
40.0000 meq | EXTENDED_RELEASE_TABLET | Freq: Once | ORAL | Status: AC
  Administered 2018-02-27: 40 meq via ORAL
  Filled 2018-02-27: qty 2

## 2018-02-27 MED ORDER — MIDAZOLAM HCL 2 MG/2ML IJ SOLN
INTRAMUSCULAR | Status: AC | PRN
  Administered 2018-02-27: 0.5 mg via INTRAVENOUS

## 2018-02-27 MED ORDER — MIDAZOLAM HCL 2 MG/2ML IJ SOLN
INTRAMUSCULAR | Status: AC
  Filled 2018-02-27: qty 2

## 2018-02-27 MED ORDER — LIDOCAINE HCL (PF) 1 % IJ SOLN
INTRAMUSCULAR | Status: AC
  Filled 2018-02-27: qty 30

## 2018-02-27 MED ORDER — FENTANYL CITRATE (PF) 100 MCG/2ML IJ SOLN
INTRAMUSCULAR | Status: AC | PRN
  Administered 2018-02-27: 25 ug via INTRAVENOUS

## 2018-02-27 MED ORDER — METOPROLOL TARTRATE 25 MG PO TABS
25.0000 mg | ORAL_TABLET | Freq: Two times a day (BID) | ORAL | Status: DC
  Administered 2018-02-27 – 2018-03-06 (×13): 25 mg via ORAL
  Filled 2018-02-27 (×14): qty 1

## 2018-02-27 MED ORDER — ACETAMINOPHEN 500 MG PO TABS
500.0000 mg | ORAL_TABLET | Freq: Once | ORAL | Status: AC
  Administered 2018-02-27: 500 mg via ORAL
  Filled 2018-02-27: qty 1

## 2018-02-27 MED ORDER — FENTANYL CITRATE (PF) 100 MCG/2ML IJ SOLN
INTRAMUSCULAR | Status: AC
  Filled 2018-02-27: qty 2

## 2018-02-27 NOTE — Progress Notes (Addendum)
Subjective:  Hd yesterday with 2 units PRBCs given ,Bloody stool this am per pt.,  Abd discomfort present but slightly better.   Objective Vital signs in last 24 hours: Vitals:   02/27/18 0419 02/27/18 0510 02/27/18 0745 02/27/18 0910  BP:  140/77 140/73 (!) 141/79  Pulse:  85 84 81  Resp:  20 18 20   Temp: 100.2 F (37.9 C) 100.2 F (37.9 C) 99.8 F (37.7 C)   TempSrc: Oral Oral Oral   SpO2:  97% 96% 96%  Weight:      Height:       Weight change: -1.7 kg  Physical Exam: General: alert , nad ,female . pleasant this am  Heart: RRR, no m,r,g Lungs: CTA Abdomen: BS , soft tender all  Quads > left , no rebound  Extremities: no pedal edema  Dialysis Access: Pos bruit LA AVF   Home meds: - metoprolol 50 bid - MVI/ nicotine patch/ vitamins    OP Dialysis:MWF [facility] 4h 64.7kg 2/2.5 bath L AVF Hep 4000 + 2000 midrun - hect 2 ug - venofer 50/wk   Problem/Plan: 1.  RLQ abd pain/ N/V - Liver masses ? Neoplasm  Suspicious - Wu  per primary, GI seeing and also Surgery / today Biopsy of liver lesion ,on Antibiotics  2. GI bleed/ anemia of ABL - Hb 11 > 7's 10/11/ 2units PRBCs imn hd  8.0 today. SP +nuclear scan then negative CTA of bowel for bleed.  3. ESRD - HD MWF.  HD yest on schedule . Next HD Monday, hopefully BP's will be better then 4. Volume/HTN - up 3kg yest (BEDWT), BP's soft on hd  kept even on HD yest . . This am 140/77 on [place] 25mg  bid  5. Anemia ckd and GI Bld. - no esa on admit , holding with ? CA  6. MBD ckd - cont vit d on HD  Ca Corec 9.3 , phos 4.7 no binders currently  7. HO Tobacco abuse  Ernest Haber, PA-C [place] [phone] 02/27/2018,12:06 PM  LOS: 4 days   Pt seen, examined, agree w assess/plan as above with additions as indicated.  [name] MD [facility] pager [phone]    cell [phone] 02/27/2018, 12:34 PM     Labs: Basic Metabolic Panel: Recent Labs  Lab  02/25/18 0348 02/26/18 1000 02/27/18 0605  NA 140 138 134*  K 4.3 4.2 3.3*  CL 102 104 97*  CO2 25 23 26   GLUCOSE 92 105* 107*  BUN 34* 43* 12  CREATININE 5.71* 7.68* 4.15*  CALCIUM 7.2* 7.0* 7.3*  PHOS  --  4.7*  --    Liver Function Tests: Recent Labs  Lab 02/23/18 1224 02/26/18 1000  AST 50*  --   ALT 36  --   ALKPHOS 110  --   BILITOT 0.5  --   PROT 7.0  --   ALBUMIN 2.8* 1.7*   Recent Labs  Lab 02/23/18 1224  LIPASE 35   No results for input(s): AMMONIA in the last 168 hours. CBC: Recent Labs  Lab 02/23/18 1224  02/24/18 1142  02/25/18 0348 02/25/18 2316 02/26/18 0425 02/27/18 0325  WBC 20.3*   < > 14.6*  --  10.6* 9.7 9.7 9.3  NEUTROABS 18.3*  --   --   --   --   --   --   --   HGB 13.0   < > 9.9*   < > 8.1* 7.0* 7.4* 8.0*  HCT 39.2   < > 33.2*   < > 25.4* 21.8* 24.1* 25.9*  MCV 95.1   < > 100.6*  --  96.9 98.2 98.4 97.4  PLT 201   < > 162  --  147* 139* 125* 95*   < > = values in this interval not displayed.   Cardiac Enzymes: No results for input(s): CKTOTAL, CKMB, CKMBINDEX, TROPONINI in the last 168 hours. CBG: Recent Labs  Lab 02/26/18 1725 02/26/18 2009 02/27/18 0002 02/27/18 0414 02/27/18 0744  GLUCAP 77 107* 86 83 88    Medications: . sodium chloride Stopped (02/26/18 0637)  . dextrose 40 mL/hr at 02/26/18 1216  . piperacillin-tazobactam (ZOSYN)  IV 3.375 g (02/27/18 0527)   . insulin aspart  0-9 Units Subcutaneous Q4H  . insulin aspart  5 Units Subcutaneous Once  . lidocaine (PF)      . mouth rinse  15 mL Mouth Rinse BID  . metoprolol tartrate  12.5 mg Oral BID  . multivitamin  1 tablet Oral QHS  . nicotine  14 mg Transdermal Q2000  . pantoprazole  40 mg Oral BID

## 2018-02-27 NOTE — Procedures (Signed)
  Pre-operative Diagnosis: Indeterminate liver lesion.  Abscess vs mass.       Post-operative Diagnosis: Liver mass   Indications: Indeterminate liver lesion  Procedure: US guided liver lesion biopsy  Findings: Heterogenous lesion in right hepatic lobe adjacent to contracted GB (?abnormal) with stones.  Lesion looked solid on [place] and no fluid could be aspirated.  3 cores obtained.    Complications: None     EBL: Minimal  Plan: Bedrest 3 hours.  Specimens in formalin and send to Pathology.

## 2018-02-27 NOTE — Progress Notes (Signed)
Daily Rounding Note  02/27/2018, 9:58 AM  LOS: 4 days   SUBJECTIVE:   Chief complaint: Diverticulitis, rule out underlying mass.  Liver masses, dominant lesion suspicious for neoplasm    Continues to have low level, persistent pain in the left lower quadrant. No nausea, no vomiting.  Anorexia continues.  Happy to stay on clear liquids. Bloody stool again this morning.  OBJECTIVE:         Vital signs in last 24 hours:    Temp:  [98 F (36.7 C)-102.2 F (39 C)] 99.8 F (37.7 C) (10/12 0745) Pulse Rate:  [73-111] 84 (10/12 0745) Resp:  [12-21] 18 (10/12 0745) BP: (128-185)/(71-87) 140/73 (10/12 0745) SpO2:  [90 %-100 %] 96 % (10/12 0745) Weight:  [67.1 kg-67.8 kg] 67.8 kg (10/11 1520) Last BM Date: 02/26/18 Filed Weights   02/25/18 0859 02/26/18 1025 02/26/18 1520  Weight: 68.8 kg 67.1 kg 67.8 kg   General: Patient does not look acutely ill.  Alert, comfortable. Heart: RRR. Chest: Clear bilaterally no labored breathing or cough. Abdomen: Soft.  Tender on the left abdomen upper and lower quadrants.  Slight guarding.  No rebound.  Bowel sounds hypoactive. Extremities: No CCE. Neuro/Psych: Alert.  Oriented x3.  Cell 4 limbs.  Intake/Output from previous day: 10/11 0701 - 10/12 0700 In: 1173.5 [I.V.:180.1; Blood:945; IV Piggyback:48.4] Out: 0   Intake/Output this shift: No intake/output data recorded.  Lab Results: Recent Labs    02/25/18 2316 02/26/18 0425 02/27/18 0325  WBC 9.7 9.7 9.3  HGB 7.0* 7.4* 8.0*  HCT 21.8* 24.1* 25.9*  PLT 139* 125* 95*   BMET Recent Labs    02/25/18 0348 02/26/18 1000 02/27/18 0605  NA 140 138 134*  K 4.3 4.2 3.3*  CL 102 104 97*  CO2 25 23 26   GLUCOSE 92 105* 107*  BUN 34* 43* 12  CREATININE 5.71* 7.68* 4.15*  CALCIUM 7.2* 7.0* 7.3*   LFT Recent Labs    02/26/18 1000  ALBUMIN 1.7*   PT/INR Recent Labs    02/26/18 1907  LABPROT 14.8  INR 1.17    Hepatitis Panel No results for input(s): HEPBSAG, HCVAB, HEPAIGM, HEPBIGM in the last 72 hours.  Studies/Results: Ir Abdomen US Limited  Result Date: 02/26/2018 CLINICAL DATA:  72 year old with an indeterminate liver lesion. EXAM: ULTRASOUND ABDOMEN LIMITED COMPARISON:  CT 02/25/2018 FINDINGS: Liver was evaluated with ultrasound. At the area of concern, there is a heterogeneous hypoechoic structure which appears solid rather than cystic. This lesion is adjacent to calcifications associated with a contracted and possibly abnormal gallbladder. Liver lesion is more suggestive for a solid lesion rather cystic collection or abscess. IMPRESSION: Dominant liver lesion is concerning for a neoplastic process. Plan for ultrasound-guided biopsy of this lesion. Electronically Signed   By: Markus Daft M.D.   On: 02/26/2018 21:48   Ct Angio Abd/pel W/ And/or W/o  Result Date: 02/25/2018 CLINICAL DATA:  Active GI bleeding. EXAM: CTA ABDOMEN AND PELVIS wITHOUT AND WITH CONTRAST TECHNIQUE: Multidetector CT imaging of the abdomen and pelvis was performed using the standard protocol during bolus administration of intravenous contrast. Multiplanar reconstructed images and MIPs were obtained and reviewed to evaluate the vascular anatomy. CONTRAST:  135mL ISOVUE-370 IOPAMIDOL (ISOVUE-370) INJECTION 76% COMPARISON:  CT, 02/23/2018. FINDINGS: VASCULAR Aorta: Normal in caliber. There is atherosclerosis throughout the aorta with no significant stenosis. Celiac: Minor plaque at the origin.  No significant stenosis. SMA: Mild plaque at the origin.  No  significant stenosis. Renals: Mild plaque just beyond the origin on the left. Minimal plaque just beyond the origin on the right. No significant stenosis. IMA: Patent without evidence of aneurysm, dissection, vasculitis or significant stenosis. Inflow: Atherosclerotic changes along the common iliac and internal iliac arteries. No significant stenosis. Mild dilation of the right  common iliac artery to 14 mm. Proximal Outflow: Mild plaque involving both common femoral arteries without significant stenosis. Veins: Patent. Review of the MIP images confirms the above findings. NON-VASCULAR Lower chest: Lower lobe dependent atelectasis. No convincing pneumonia. No pulmonary edema. Heart is mildly enlarged. Hepatobiliary: Heterogeneous cystic appearing mass in the right lobe is unchanged from the study performed 2 days ago. There are 2 smaller adjacent masses that lie posterior and inferior to the dominant right lobe mass. No other liver lesions. There is mild intrahepatic bile duct dilation in the upper right lobe, segment 7. Gallbladder is mostly collapsed. There are gallstones. No bile duct dilation. Pancreas: Unremarkable. No pancreatic ductal dilatation or surrounding inflammatory changes. Spleen: Normal in size without focal abnormality. Adrenals/Urinary Tract: No adrenal masses. Kidneys are normal in size, orientation and position with symmetric enhancement. There are 2 adjacent nonobstructing stones in the left kidney at the midpole. No renal masses. No hydronephrosis. Normal ureters. Bladder is mostly collapsed. It contains contrast enhanced urine. Stomach/Bowel: As noted on the prior CT, there is a cecal mass versus eccentric wall thickening. Hazy inflammation is now noted adjacent to this. There are several diverticula. Findings support right colon diverticulitis. No abscess. No extraluminal air. There is no evidence of arterial extravasation of contrast into the right colon, or elsewhere in the bowel, to indicate an active bleeding source. Right colon is mildly distended. No other areas of wall thickening. No other evidence of a mass. There are additional colonic diverticula with no other inflammatory changes. Stomach and small bowel unremarkable. Normal appendix visualized. Lymphatic: Mildly prominent gastrohepatic ligament lymph nodes, none pathologically enlarged. Reproductive:  Uterus and bilateral adnexa are unremarkable. Other: No abdominal wall hernia or abnormality. No abdominopelvic ascites. Musculoskeletal: No fracture or acute finding. No osteoblastic or osteolytic lesions. IMPRESSION: VASCULAR 1. Atherosclerotic changes as described without significant stenosis. 2. No evidence of an active arterial bleeding source in the bowel. NON-VASCULAR 1. Findings are similar to the prior CT. There is eccentric wall thickening versus a possible mass in the cecum. There are associated diverticula and adjacent inflammation. Findings are most likely due to diverticulitis. No extraluminal air or abscess. There is no visualized arterial bleeding, although this is likely the source of GI bleeding. 2. Right liver lobe masses as described on the prior CT. Differential diagnosis includes liver abscesses. 3. No other acute abnormality in the abdomen and pelvis. 4. There are other colonic diverticula.  No other diverticulitis. 5. Left intrarenal stones.  Gallstones. 6. Aortic atherosclerosis. Electronically Signed   By: Lajean Manes M.D.   On: 02/25/2018 18:43   Scheduled Meds: . insulin aspart  0-9 Units Subcutaneous Q4H  . insulin aspart  5 Units Subcutaneous Once  . mouth rinse  15 mL Mouth Rinse BID  . metoprolol tartrate  12.5 mg Oral BID  . multivitamin  1 tablet Oral QHS  . nicotine  14 mg Transdermal Q2000  . pantoprazole  40 mg Oral BID   Continuous Infusions: . sodium chloride Stopped (02/26/18 8676)  . dextrose 40 mL/hr at 02/26/18 1216  . piperacillin-tazobactam (ZOSYN)  IV 3.375 g (02/27/18 0527)   PRN Meds:.sodium chloride, acetaminophen **OR** acetaminophen, calcium  carbonate (dosed in mg elemental calcium), camphor-menthol **AND** hydrOXYzine, docusate sodium, feeding supplement (NEPRO CARB STEADY), HYDROmorphone (DILAUDID) injection, labetalol, sorbitol, zolpidem   ASSESMENT:   *    Acute GI bleed.  Dark emesis. IV Protonix BID >> Protonix po BID. Nuclear medicine  bleeding scan 10/9: Bleeding at second hour in the left abdomen, favoring small bowel source. CTA 10/10: Eccentric cecal wall thickening, possibly a mass with associated diverticula and adjacent inflammation.  Findings most likely due to diverticulitis.  No visualized bleeding although the cecal findings are most likely source of bleeding. Liver masses in the right lobe,  differential includes abscesses.  Other diverticula within the colon.  Gallstones and left intra-renal stones.  Aortic atherosclerosis. Enteroscopy canceled 10/10 due to acute altered mental status, tachycardia. Ultrasound 10/11.   Dominant liver lesion concerning for a neoplasm. Plan for ultrasound-guided biopsy of this lesion.  They did not perform this yesterday, wanted to recheck PT/INR which is normal.  *    Acute blood loss anemia.  2 units PRBCs 10/11. Hgb 7 >> 8  *    Cystic liver lesions, concerning for abscesses.  *    Cecal diverticulitis versus focal colitis, unable to rule out underlying neoplasia.  Previous colonoscopy at outside facilities reported as unremarkable by patient.  Timing of last colonoscopy not known.   Zosyn day 4.  Blood cultures negative at 3 days.  *    Noncritical thrombocytopenia.  New, acute.   PT/INR ok.    *    ESRD.  Hemodialysis MWF.   *   Hypokalemia.      PLAN   *   Biopsy of liver lesion today, per discussion with her RN. She is n.p.o. for the biopsy but can resume clear liquids once biopsy obtained.  *    Though she is still having bloody stools, the frequency and volume of blood seems at most moderate, do not think she warrants repeat NM bleeding study.    *     Follow H&H, additional transfusions as needed  *    Continue antibiotic    Mary Chapman  02/27/2018, 9:58 AM Phone [phone]

## 2018-02-27 NOTE — Progress Notes (Signed)
Notified by Lab technician that pt coughed out phlegm stained with blood. It is described as thick and dark red

## 2018-02-27 NOTE — Progress Notes (Signed)
[facility] Progress Note  2 Days Post-Op  Subjective: CC-  Patient resting in bed. States that abdominal pain is slightly better. Currently pain is localized to RLQ, rates as 5/10. Denies n/v. BM last night reported as bloody. Hg 8 today from 7.4 after 2 units PRBCs yesterday.  Objective: Vital signs in last 24 hours: Temp:  [98 F (36.7 C)-102.2 F (39 C)] 99.8 F (37.7 C) (10/12 0745) Pulse Rate:  [73-111] 84 (10/12 0745) Resp:  [12-21] 18 (10/12 0745) BP: (128-185)/(71-87) 140/73 (10/12 0745) SpO2:  [90 %-100 %] 96 % (10/12 0745) Weight:  [67.1 kg-67.8 kg] 67.8 kg (10/11 1520) Last BM Date: 02/26/18  Intake/Output from previous day: 10/11 0701 - 10/12 0700 In: 1173.5 [I.V.:180.1; Blood:945; IV Piggyback:48.4] Out: 0  Intake/Output this shift: No intake/output data recorded.  PE: Gen:  Alert, NAD HEENT: EOM's intact, pupils equal and round Card:  RRR Pulm:  CTAB, no W/R/R, effort normal Abd: Soft, ND, focal TTP RLQ without rebound or guarding, +BS, no HSM, no hernia Skin: no rashes noted, warm and dry  Lab Results:  Recent Labs    02/26/18 0425 02/27/18 0325  WBC 9.7 9.3  HGB 7.4* 8.0*  HCT 24.1* 25.9*  PLT 125* 95*   BMET Recent Labs    02/26/18 1000 02/27/18 0605  NA 138 134*  K 4.2 3.3*  CL 104 97*  CO2 23 26  GLUCOSE 105* 107*  BUN 43* 12  CREATININE 7.68* 4.15*  CALCIUM 7.0* 7.3*   PT/INR Recent Labs    02/26/18 1907  LABPROT 14.8  INR 1.17   CMP     Component Value Date/Time   NA 134 (L) 02/27/2018 0605   K 3.3 (L) 02/27/2018 0605   CL 97 (L) 02/27/2018 0605   CO2 26 02/27/2018 0605   GLUCOSE 107 (H) 02/27/2018 0605   BUN 12 02/27/2018 0605   CREATININE 4.15 (H) 02/27/2018 0605   CALCIUM 7.3 (L) 02/27/2018 0605   PROT 7.0 02/23/2018 1224   ALBUMIN 1.7 (L) 02/26/2018 1000   AST 50 (H) 02/23/2018 1224   ALT 36 02/23/2018 1224   ALKPHOS 110 02/23/2018 1224   BILITOT 0.5 02/23/2018 1224   GFRNONAA 10 (L)  02/27/2018 0605   GFRAA 11 (L) 02/27/2018 0605   Lipase     Component Value Date/Time   LIPASE 35 02/23/2018 1224       Studies/Results: Ir Abdomen US Limited  Result Date: 02/26/2018 CLINICAL DATA:  72 year old with an indeterminate liver lesion. EXAM: ULTRASOUND ABDOMEN LIMITED COMPARISON:  CT 02/25/2018 FINDINGS: Liver was evaluated with ultrasound. At the area of concern, there is a heterogeneous hypoechoic structure which appears solid rather than cystic. This lesion is adjacent to calcifications associated with a contracted and possibly abnormal gallbladder. Liver lesion is more suggestive for a solid lesion rather cystic collection or abscess. IMPRESSION: Dominant liver lesion is concerning for a neoplastic process. Plan for ultrasound-guided biopsy of this lesion. Electronically Signed   By: Markus Daft M.D.   On: 02/26/2018 21:48   Ct Angio Abd/pel W/ And/or W/o  Result Date: 02/25/2018 CLINICAL DATA:  Active GI bleeding. EXAM: CTA ABDOMEN AND PELVIS wITHOUT AND WITH CONTRAST TECHNIQUE: Multidetector CT imaging of the abdomen and pelvis was performed using the standard protocol during bolus administration of intravenous contrast. Multiplanar reconstructed images and MIPs were obtained and reviewed to evaluate the vascular anatomy. CONTRAST:  165mL ISOVUE-370 IOPAMIDOL (ISOVUE-370) INJECTION 76% COMPARISON:  CT, 02/23/2018. FINDINGS: VASCULAR Aorta: Normal in  caliber. There is atherosclerosis throughout the aorta with no significant stenosis. Celiac: Minor plaque at the origin.  No significant stenosis. SMA: Mild plaque at the origin.  No significant stenosis. Renals: Mild plaque just beyond the origin on the left. Minimal plaque just beyond the origin on the right. No significant stenosis. IMA: Patent without evidence of aneurysm, dissection, vasculitis or significant stenosis. Inflow: Atherosclerotic changes along the common iliac and internal iliac arteries. No significant stenosis.  Mild dilation of the right common iliac artery to 14 mm. Proximal Outflow: Mild plaque involving both common femoral arteries without significant stenosis. Veins: Patent. Review of the MIP images confirms the above findings. NON-VASCULAR Lower chest: Lower lobe dependent atelectasis. No convincing pneumonia. No pulmonary edema. Heart is mildly enlarged. Hepatobiliary: Heterogeneous cystic appearing mass in the right lobe is unchanged from the study performed 2 days ago. There are 2 smaller adjacent masses that lie posterior and inferior to the dominant right lobe mass. No other liver lesions. There is mild intrahepatic bile duct dilation in the upper right lobe, segment 7. Gallbladder is mostly collapsed. There are gallstones. No bile duct dilation. Pancreas: Unremarkable. No pancreatic ductal dilatation or surrounding inflammatory changes. Spleen: Normal in size without focal abnormality. Adrenals/Urinary Tract: No adrenal masses. Kidneys are normal in size, orientation and position with symmetric enhancement. There are 2 adjacent nonobstructing stones in the left kidney at the midpole. No renal masses. No hydronephrosis. Normal ureters. Bladder is mostly collapsed. It contains contrast enhanced urine. Stomach/Bowel: As noted on the prior CT, there is a cecal mass versus eccentric wall thickening. Hazy inflammation is now noted adjacent to this. There are several diverticula. Findings support right colon diverticulitis. No abscess. No extraluminal air. There is no evidence of arterial extravasation of contrast into the right colon, or elsewhere in the bowel, to indicate an active bleeding source. Right colon is mildly distended. No other areas of wall thickening. No other evidence of a mass. There are additional colonic diverticula with no other inflammatory changes. Stomach and small bowel unremarkable. Normal appendix visualized. Lymphatic: Mildly prominent gastrohepatic ligament lymph nodes, none pathologically  enlarged. Reproductive: Uterus and bilateral adnexa are unremarkable. Other: No abdominal wall hernia or abnormality. No abdominopelvic ascites. Musculoskeletal: No fracture or acute finding. No osteoblastic or osteolytic lesions. IMPRESSION: VASCULAR 1. Atherosclerotic changes as described without significant stenosis. 2. No evidence of an active arterial bleeding source in the bowel. NON-VASCULAR 1. Findings are similar to the prior CT. There is eccentric wall thickening versus a possible mass in the cecum. There are associated diverticula and adjacent inflammation. Findings are most likely due to diverticulitis. No extraluminal air or abscess. There is no visualized arterial bleeding, although this is likely the source of GI bleeding. 2. Right liver lobe masses as described on the prior CT. Differential diagnosis includes liver abscesses. 3. No other acute abnormality in the abdomen and pelvis. 4. There are other colonic diverticula.  No other diverticulitis. 5. Left intrarenal stones.  Gallstones. 6. Aortic atherosclerosis. Electronically Signed   By: Lajean Manes M.D.   On: 02/25/2018 18:43    Anti-infectives: Anti-infectives (From admission, onward)   Start     Dose/Rate Route Frequency Ordered Stop   02/23/18 1319  piperacillin-tazobactam (ZOSYN) IVPB 3.375 g     3.375 g 12.5 mL/hr over 240 Minutes Intravenous Every 12 hours 02/23/18 1319         Assessment/Plan End-stage renal disease on dialysis Hypertension Ongoing tobacco use Hyperlipidemia  Right lower quadrant pain GI  bleed Possible diverticulitisvs colitis vs neoplasm of the cecum  - GI following, recommending colonoscopy when acute infection resolved - CTA negative 10/10 for a source of bleed - tagged RBC scan shows bleeding in L abdomen, likely SB source - IR biopsy/drainage of liver lesion today. Ok to resume clear liquids after procedure. Continue IV abx. Transfusions PRN per primary.  ID - zosyn 10/8>> FEN - IVF,  CLD VTE - SCDs, no chemical DVT prophylaxis due to bleeding Foley - none   LOS: 4 days    Wellington [place] , [facility] 02/27/2018, 9:21 AM Pager: [phone] Mon 7:00 am -11:30 AM Tues-Fri 7:00 am-4:30 pm Sat-Sun 7:00 am-11:30 am

## 2018-02-27 NOTE — Progress Notes (Signed)
PROGRESS NOTE    Mary Chapman  BHA:[id number] DOB: 08/10/1945 DOA: 02/23/2018 PCP: [facility]    Brief Narrative:  72 y.o. female with medical history significant of h/o ESRD on HD due to FSGS; HTN; and tobacco dependence presenting with abdominal pain.   She developed abdominal pain, she usually has a high pain tolerance and she couldn't stand it anymore.  It was painful all night.  The pain started yesterday about noon.  She went to HD and felt ok.  RLQ pain mostly, although the pain is also diffuse.  It came on acutely and worsened.  +nausea and vomiting, starting last night and also this AM.  Last emesis was about 2pm.  Last BM was last evening and was loose; she had a total of 3-4 loose BMs yesterday (but she often has loose stools while on HD).  No fevers, no chills.  Her last colonoscopy was 3-4 years ago at [facility]; it was normal, to her knowledge.   ED Course: Pain started yesterday and worsened. WBC 20k. No fever, normal lactate. CT with cecal inflammation and diverticular changes - ?R-sided diverticulitis. Cannot r/o mass. Also with liver changes - abscess, neoplasm on ddx. Suggest MRI or CT with contrast. She was given Zosyn. Needs admission for intraabdominal infection. Surgery has not been called.  Assessment & Plan:   Principal Problem:   Diverticulitis Active Problems:   Tobacco abuse   Essential hypertension   Focal segmental glomerulosclerosis   Hyperlipidemia   ESRD on dialysis [facility])   Liver mass   Gastrointestinal hemorrhage associated with anorectal source   Liver abscess  Intraabdominal infection - Proximal diverticulitis, Liver abscess vs malignancy -Patient developed acute onset of RLQ pain prior to admit with n/v, no fever, +leukocytosis -CT personally reviewed. Findings suggestive of diverticulitis at level of cecum with findings of 4-5cm fluid collections in liver c/w liver abscess -General Surgery consulted, recs noted.  Currently on empiric abx -GI is following -Attempt at liver lesion drainage, however turned out to be solid lesion, thus multiple biopsies were obtained and are pending results -Per surgery, concerns for malignant process. Pending biopsy result  ESRD on HD, due to FSGS -Patient on chronic MWF HD -Nephrology following -Continuing HD per Nephrology, tolerating thus far  HTN -BP now improving -Had initially resumed metoprolol at lower dose. -Will increase metoprolol to 25mg  bid (home dose 50mg  bid)  Acute blood loss anemia -Earlier this admit, patient with large dark stools with resultant drop in 2-3 g of hgb -GI following. Did not do EGD 10/10 secondary to instability -Recent bleeding scan with evidence of bleeding in small bowels -CT with no acute bleed noted -Follow up on GI recs -Patient received 2 units PRBC"s on HD. Still bleeding per RN. Will repeat CBC this afternoon -Will repeat CBC in AM  Tobacco abuse -Cessation was encouraged -Currently stable  Tachycardia -resolved -Suspect related to worsened anemia vs rebound tachycardia after holding metoprolol following recent hemorrhagic shock -continued on beta blocker  DVT prophylaxis: SCD Code Status: Full Family Communication: Pt in room, family not at bedside Disposition Plan: Uncertain at this time  Consultants:   GI  General Surgery  IR  Procedures:     Antimicrobials: Anti-infectives (From admission, onward)   Start     Dose/Rate Route Frequency Ordered Stop   02/23/18 1319  piperacillin-tazobactam (ZOSYN) IVPB 3.375 g     3.375 g 12.5 mL/hr over 240 Minutes Intravenous Every 12 hours 02/23/18 1319  Subjective: No complaints this AM  Objective: Vitals:   02/27/18 1240 02/27/18 1245 02/27/18 1259 02/27/18 1345  BP: (!) 155/34 (!) 173/70 (!) 178/69 139/76  Pulse: 80 84 81 82  Resp: 20 17 15 20   Temp:      TempSrc:      SpO2: 100% 100% 96% 95%  Weight:      Height:       No intake  or output data in the 24 hours ending 02/27/18 1523 Filed Weights   02/25/18 0859 02/26/18 1025 02/26/18 1520  Weight: 68.8 kg 67.1 kg 67.8 kg    Examination: General exam: Awake, laying in bed, in nad Respiratory system: Normal respiratory effort, no wheezing Cardiovascular system: regular rate, s1, s2 Gastrointestinal system: Soft, nondistended, positive BS Central nervous system: CN2-12 grossly intact, strength intact Extremities: Perfused, no clubbing Skin: Normal skin turgor, no notable skin lesions seen Psychiatry: Mood normal // no visual hallucinations   Data Reviewed: I have personally reviewed following labs and imaging studies  CBC: Recent Labs  Lab 02/23/18 1224  02/25/18 0348 02/25/18 2316 02/26/18 0425 02/27/18 0325 02/27/18 0605  WBC 20.3*   < > 10.6* 9.7 9.7 9.3 10.6*  NEUTROABS 18.3*  --   --   --   --   --   --   HGB 13.0   < > 8.1* 7.0* 7.4* 8.0* 9.2*  HCT 39.2   < > 25.4* 21.8* 24.1* 25.9* 28.4*  MCV 95.1   < > 96.9 98.2 98.4 97.4 94.4  PLT 201   < > 147* 139* 125* 95* 132*   < > = values in this interval not displayed.   Basic Metabolic Panel: Recent Labs  Lab 02/24/18 0521 02/24/18 1428 02/25/18 0348 02/26/18 1000 02/27/18 0605  NA 130* 132* 140 138 134*  K 6.4* 6.3* 4.3 4.2 3.3*  CL 92* 96* 102 104 97*  CO2 23 19* 25 23 26   GLUCOSE 50* 96 92 105* 107*  BUN 53* 65* 34* 43* 12  CREATININE 9.07* 9.48* 5.71* 7.68* 4.15*  CALCIUM 6.5* 6.6* 7.2* 7.0* 7.3*  MG  --   --  1.9  --   --   PHOS  --   --   --  4.7*  --    GFR: Estimated Creatinine Clearance: 11.6 mL/min (A) (by C-G formula based on SCr of 4.15 mg/dL (H)). Liver Function Tests: Recent Labs  Lab 02/23/18 1224 02/26/18 1000  AST 50*  --   ALT 36  --   ALKPHOS 110  --   BILITOT 0.5  --   PROT 7.0  --   ALBUMIN 2.8* 1.7*   Recent Labs  Lab 02/23/18 1224  LIPASE 35   No results for input(s): AMMONIA in the last 168 hours. Coagulation Profile: Recent Labs  Lab  02/26/18 1907  INR 1.17   Cardiac Enzymes: No results for input(s): CKTOTAL, CKMB, CKMBINDEX, TROPONINI in the last 168 hours. BNP (last 3 results) No results for input(s): PROBNP in the last 8760 hours. HbA1C: No results for input(s): HGBA1C in the last 72 hours. CBG: Recent Labs  Lab 02/26/18 2009 02/27/18 0002 02/27/18 0414 02/27/18 0744 02/27/18 1503  GLUCAP 107* 86 83 88 125*   Lipid Profile: No results for input(s): CHOL, HDL, LDLCALC, TRIG, CHOLHDL, LDLDIRECT in the last 72 hours. Thyroid Function Tests: No results for input(s): TSH, T4TOTAL, FREET4, T3FREE, THYROIDAB in the last 72 hours. Anemia Panel: No results for input(s): VITAMINB12, FOLATE, FERRITIN, TIBC, IRON,  RETICCTPCT in the last 72 hours. Sepsis Labs: Recent Labs  Lab 02/23/18 1415  LATICACIDVEN 1.76    Recent Results (from the past 240 hour(s))  MRSA PCR Screening     Status: None   Collection Time: 02/24/18 12:19 AM  Result Value Ref Range Status   MRSA by PCR NEGATIVE NEGATIVE Final    Comment:        The GeneXpert MRSA Assay (FDA approved for NASAL specimens only), is one component of a comprehensive MRSA colonization surveillance program. It is not intended to diagnose MRSA infection nor to guide or monitor treatment for MRSA infections. Performed at [facility], [place] [address] [place], [place] [address]   Culture, blood (routine x 2)     Status: None (Preliminary result)   Collection Time: 02/24/18 10:15 AM  Result Value Ref Range Status   Specimen Description BLOOD RIGHT HAND  Final   Special Requests   Final    BOTTLES DRAWN AEROBIC ONLY Blood Culture adequate volume   Culture   Final    NO GROWTH 3 DAYS Performed at [facility], [place] [address], [place], [place] [address]    Report Status PENDING  Incomplete  Culture, blood (routine x 2)     Status: None (Preliminary result)   Collection Time: 02/24/18 10:18 AM  Result Value Ref Range Status   Specimen  Description BLOOD RIGHT HAND  Final   Special Requests   Final    BOTTLES DRAWN AEROBIC ONLY Blood Culture adequate volume   Culture   Final    NO GROWTH 3 DAYS Performed at [facility], [place] [address] [place], [place] [address]    Report Status PENDING  Incomplete     Radiology Studies: Ir Abdomen US Limited  Result Date: 02/26/2018 CLINICAL DATA:  72 year old with an indeterminate liver lesion. EXAM: ULTRASOUND ABDOMEN LIMITED COMPARISON:  CT 02/25/2018 FINDINGS: Liver was evaluated with ultrasound. At the area of concern, there is a heterogeneous hypoechoic structure which appears solid rather than cystic. This lesion is adjacent to calcifications associated with a contracted and possibly abnormal gallbladder. Liver lesion is more suggestive for a solid lesion rather cystic collection or abscess. IMPRESSION: Dominant liver lesion is concerning for a neoplastic process. Plan for ultrasound-guided biopsy of this lesion. Electronically Signed   By: Markus Daft M.D.   On: 02/26/2018 21:48   Ct Angio Abd/pel W/ And/or W/o  Result Date: 02/25/2018 CLINICAL DATA:  Active GI bleeding. EXAM: CTA ABDOMEN AND PELVIS wITHOUT AND WITH CONTRAST TECHNIQUE: Multidetector CT imaging of the abdomen and pelvis was performed using the standard protocol during bolus administration of intravenous contrast. Multiplanar reconstructed images and MIPs were obtained and reviewed to evaluate the vascular anatomy. CONTRAST:  112mL ISOVUE-370 IOPAMIDOL (ISOVUE-370) INJECTION 76% COMPARISON:  CT, 02/23/2018. FINDINGS: VASCULAR Aorta: Normal in caliber. There is atherosclerosis throughout the aorta with no significant stenosis. Celiac: Minor plaque at the origin.  No significant stenosis. SMA: Mild plaque at the origin.  No significant stenosis. Renals: Mild plaque just beyond the origin on the left. Minimal plaque just beyond the origin on the right. No significant stenosis. IMA: Patent without evidence of aneurysm,  dissection, vasculitis or significant stenosis. Inflow: Atherosclerotic changes along the common iliac and internal iliac arteries. No significant stenosis. Mild dilation of the right common iliac artery to 14 mm. Proximal Outflow: Mild plaque involving both common femoral arteries without significant stenosis. Veins: Patent. Review of the MIP images confirms the  above findings. NON-VASCULAR Lower chest: Lower lobe dependent atelectasis. No convincing pneumonia. No pulmonary edema. Heart is mildly enlarged. Hepatobiliary: Heterogeneous cystic appearing mass in the right lobe is unchanged from the study performed 2 days ago. There are 2 smaller adjacent masses that lie posterior and inferior to the dominant right lobe mass. No other liver lesions. There is mild intrahepatic bile duct dilation in the upper right lobe, segment 7. Gallbladder is mostly collapsed. There are gallstones. No bile duct dilation. Pancreas: Unremarkable. No pancreatic ductal dilatation or surrounding inflammatory changes. Spleen: Normal in size without focal abnormality. Adrenals/Urinary Tract: No adrenal masses. Kidneys are normal in size, orientation and position with symmetric enhancement. There are 2 adjacent nonobstructing stones in the left kidney at the midpole. No renal masses. No hydronephrosis. Normal ureters. Bladder is mostly collapsed. It contains contrast enhanced urine. Stomach/Bowel: As noted on the prior CT, there is a cecal mass versus eccentric wall thickening. Hazy inflammation is now noted adjacent to this. There are several diverticula. Findings support right colon diverticulitis. No abscess. No extraluminal air. There is no evidence of arterial extravasation of contrast into the right colon, or elsewhere in the bowel, to indicate an active bleeding source. Right colon is mildly distended. No other areas of wall thickening. No other evidence of a mass. There are additional colonic diverticula with no other inflammatory  changes. Stomach and small bowel unremarkable. Normal appendix visualized. Lymphatic: Mildly prominent gastrohepatic ligament lymph nodes, none pathologically enlarged. Reproductive: Uterus and bilateral adnexa are unremarkable. Other: No abdominal wall hernia or abnormality. No abdominopelvic ascites. Musculoskeletal: No fracture or acute finding. No osteoblastic or osteolytic lesions. IMPRESSION: VASCULAR 1. Atherosclerotic changes as described without significant stenosis. 2. No evidence of an active arterial bleeding source in the bowel. NON-VASCULAR 1. Findings are similar to the prior CT. There is eccentric wall thickening versus a possible mass in the cecum. There are associated diverticula and adjacent inflammation. Findings are most likely due to diverticulitis. No extraluminal air or abscess. There is no visualized arterial bleeding, although this is likely the source of GI bleeding. 2. Right liver lobe masses as described on the prior CT. Differential diagnosis includes liver abscesses. 3. No other acute abnormality in the abdomen and pelvis. 4. There are other colonic diverticula.  No other diverticulitis. 5. Left intrarenal stones.  Gallstones. 6. Aortic atherosclerosis. Electronically Signed   By: Lajean Manes M.D.   On: 02/25/2018 18:43    Scheduled Meds: . insulin aspart  0-9 Units Subcutaneous Q4H  . insulin aspart  5 Units Subcutaneous Once  . mouth rinse  15 mL Mouth Rinse BID  . metoprolol tartrate  25 mg Oral BID  . multivitamin  1 tablet Oral QHS  . nicotine  14 mg Transdermal Q2000  . pantoprazole  40 mg Oral BID   Continuous Infusions: . sodium chloride Stopped (02/26/18 7408)  . dextrose 40 mL/hr at 02/26/18 1216  . piperacillin-tazobactam (ZOSYN)  IV 3.375 g (02/27/18 0527)     LOS: 4 days   Marylu Lund, MD [facility] Pager On Amion  If 7PM-7AM, please contact night-coverage 02/27/2018, 3:23 PM

## 2018-02-28 DIAGNOSIS — R160 Hepatomegaly, not elsewhere classified: Secondary | ICD-10-CM

## 2018-02-28 DIAGNOSIS — K922 Gastrointestinal hemorrhage, unspecified: Secondary | ICD-10-CM

## 2018-02-28 LAB — CBC
HCT: 32.4 % — ABNORMAL LOW (ref 36.0–46.0)
Hemoglobin: 10.5 g/dL — ABNORMAL LOW (ref 12.0–15.0)
MCH: 30.7 pg (ref 26.0–34.0)
MCHC: 32.4 g/dL (ref 30.0–36.0)
MCV: 94.7 fL (ref 80.0–100.0)
PLATELETS: 175 10*3/uL (ref 150–400)
RBC: 3.42 MIL/uL — ABNORMAL LOW (ref 3.87–5.11)
RDW: 16.9 % — AB (ref 11.5–15.5)
WBC: 16.2 10*3/uL — AB (ref 4.0–10.5)
nRBC: 0 % (ref 0.0–0.2)

## 2018-02-28 LAB — GLUCOSE, CAPILLARY
GLUCOSE-CAPILLARY: 73 mg/dL (ref 70–99)
GLUCOSE-CAPILLARY: 94 mg/dL (ref 70–99)
Glucose-Capillary: 81 mg/dL (ref 70–99)
Glucose-Capillary: 93 mg/dL (ref 70–99)
Glucose-Capillary: 123 mg/dL — ABNORMAL HIGH (ref 70–99)

## 2018-02-28 MED ORDER — VANCOMYCIN HCL 10 G IV SOLR
1750.0000 mg | Freq: Once | INTRAVENOUS | Status: AC
  Administered 2018-02-28: 1750 mg via INTRAVENOUS
  Filled 2018-02-28: qty 1750

## 2018-02-28 MED ORDER — VANCOMYCIN HCL IN DEXTROSE 750-5 MG/150ML-% IV SOLN
750.0000 mg | INTRAVENOUS | Status: DC
  Administered 2018-03-01 – 2018-03-03 (×2): 750 mg via INTRAVENOUS
  Filled 2018-02-28 (×3): qty 150

## 2018-02-28 MED ORDER — SODIUM CHLORIDE 0.9 % IV SOLN
62.5000 mg | INTRAVENOUS | Status: DC
  Administered 2018-03-03 – 2018-03-05 (×2): 62.5 mg via INTRAVENOUS
  Filled 2018-02-28 (×2): qty 5

## 2018-02-28 NOTE — Progress Notes (Signed)
Pharmacy Antibiotic Note  Mary Chapman is a 72 y.o. female admitted on 02/23/2018 with abdominal pain. Patient has a PMH significant for ESRD on HD due to FSGS, HTN, tobacco dependence.   Patient's tMAX 103.2, wbc 16.2 up from 11.5. Pharmacy has been consulted for vancomycin dosing. Patient has been on zosyn since 10/8. No growth on cultures today.   Plan: Start Vancomycin 1750mg  IV loading dose today. Start Vancomycin 750mg  IV every HD session on MWF. Continue Zosyn 3.375mg  every 12 hours - 4 hour infusion. Monitor clinical improvement, signs/symptoms of infection, LOT, vancomycin troughs as indicated  Height: 5\' 6"  (167.6 cm) Weight: 149 lb 7.6 oz (67.8 kg) IBW/kg (Calculated) : 59.3  Temp (24hrs), Avg:100.7 F (38.2 C), Min:98.3 F (36.8 C), Max:103.2 F (39.6 C)  Recent Labs  Lab 02/23/18 1415 02/24/18 0521  02/24/18 1428 02/25/18 0348  02/26/18 0425 02/26/18 1000 02/27/18 0325 02/27/18 0605 02/27/18 1636 02/28/18 0435  WBC  --  13.4*   < >  --  10.6*   < > 9.7  --  9.3 10.6* 11.5* 16.2*  CREATININE  --  9.07*  --  9.48* 5.71*  --   --  7.68*  --  4.15*  --   --   LATICACIDVEN 1.76  --   --   --   --   --   --   --   --   --   --   --    < > = values in this interval not displayed.    Estimated Creatinine Clearance: 11.6 mL/min (A) (by C-G formula based on SCr of 4.15 mg/dL (H)).    No Known Allergies  Antimicrobials this admission: 10/8 zosyn >>  10/13 vancomycin >>   Dose adjustments this admission: n/a  Microbiology results: 10/09 BCx: NGTD 10/09 MRSA PCR: NEG  Thank you for allowing pharmacy to be a part of this patient's care.  Tamela Gammon, PharmD 02/28/2018 9:11 AM PGY-1 Pharmacy Resident Phone: [phone] Please check [email or web address] for unit-specific pharmacist phone numbers after 3:00 P.M.

## 2018-02-28 NOTE — Progress Notes (Signed)
PROGRESS NOTE    Mary Chapman  QZR:[id number] DOB: 1945-08-07 DOA: 02/23/2018 PCP: [facility]    Brief Narrative:  72 y.o. female with medical history significant of h/o ESRD on HD due to FSGS; HTN; and tobacco dependence presenting with abdominal pain.   She developed abdominal pain, she usually has a high pain tolerance and she couldn't stand it anymore.  It was painful all night.  The pain started yesterday about noon.  She went to HD and felt ok.  RLQ pain mostly, although the pain is also diffuse.  It came on acutely and worsened.  +nausea and vomiting, starting last night and also this AM.  Last emesis was about 2pm.  Last BM was last evening and was loose; she had a total of 3-4 loose BMs yesterday (but she often has loose stools while on HD).  No fevers, no chills.  Her last colonoscopy was 3-4 years ago at [facility]; it was normal, to her knowledge.   ED Course: Pain started yesterday and worsened. WBC 20k. No fever, normal lactate. CT with cecal inflammation and diverticular changes - ?R-sided diverticulitis. Cannot r/o mass. Also with liver changes - abscess, neoplasm on ddx. Suggest MRI or CT with contrast. She was given Zosyn. Needs admission for intraabdominal infection. Surgery has not been called.  Assessment & Plan:   Principal Problem:   Diverticulitis Active Problems:   Tobacco abuse   Essential hypertension   Focal segmental glomerulosclerosis   Hyperlipidemia   ESRD on dialysis [facility])   Liver mass   Gastrointestinal hemorrhage associated with anorectal source   Liver abscess  Intraabdominal infection - Proximal diverticulitis, Liver abscess vs malignancy -Patient developed acute onset of RLQ pain prior to admit with n/v, no fever, +leukocytosis -CT personally reviewed. Findings suggestive of diverticulitis at level of cecum with findings of 4-5cm fluid collections in liver c/w liver abscess -General Surgery consulted, recs noted.  Currently on empiric abx -GI is following -Attempt at liver lesion drainage, however turned out to be solid lesion, thus multiple biopsies were obtained and are pending results -Per surgery, concerns for malignant process. Biopsy results pending at this time  ESRD on HD, due to FSGS -Patient on chronic MWF HD -Nephrology following -Continuing HD per Nephrology, tolerating thus far  HTN -BP now improving -Had initially resumed metoprolol at lower dose. -Will increase metoprolol to 25mg  bid (home dose 50mg  bid)  Acute blood loss anemia -Earlier this admit, patient with large dark stools with resultant drop in 2-3 g of hgb -GI following. Did not do EGD 10/10 secondary to instability -Recent bleeding scan with evidence of bleeding in small bowels -CT with no acute bleed noted -Follow up on GI recs -Patient received 2 units PRBC"s on HD. Still bleeding per RN. Will repeat CBC this afternoon -Will repeat CBC in AM  Tobacco abuse -Cessation was encouraged -Currently stable  Tachycardia -resolved -Suspect related to worsened anemia vs rebound tachycardia after holding metoprolol following recent hemorrhagic shock -continued on beta blocker  DVT prophylaxis: SCD Code Status: Full Family Communication: Pt in room, family not at bedside Disposition Plan: Uncertain at this time  Consultants:   GI  General Surgery  IR  Procedures:     Antimicrobials: Anti-infectives (From admission, onward)   Start     Dose/Rate Route Frequency Ordered Stop   03/01/18 1200  vancomycin (VANCOCIN) IVPB 750 mg/150 ml premix     750 mg 150 mL/hr over 60 Minutes Intravenous Every M-W-F (Hemodialysis) 02/28/18  2800     02/28/18 0930  vancomycin (VANCOCIN) 1,750 mg in sodium chloride 0.9 % 500 mL IVPB     1,750 mg 250 mL/hr over 120 Minutes Intravenous  Once 02/28/18 0913 02/28/18 1323   02/23/18 1319  piperacillin-tazobactam (ZOSYN) IVPB 3.375 g     3.375 g 12.5 mL/hr over 240 Minutes  Intravenous Every 12 hours 02/23/18 1319        Subjective: No complaints this AM  Objective: Vitals:   02/28/18 0431 02/28/18 0449 02/28/18 0811 02/28/18 1231  BP: 125/66  (!) 142/81 138/77  Pulse:   73   Resp: 18  16 15   Temp:  98.3 F (36.8 C) 98.5 F (36.9 C) 98.9 F (37.2 C)  TempSrc:  Oral Oral Oral  SpO2:   98%   Weight:      Height:        Intake/Output Summary (Last 24 hours) at 02/28/2018 1750 Last data filed at 02/28/2018 1500 Gross per 24 hour  Intake 2982.98 ml  Output -  Net 2982.98 ml   Filed Weights   02/25/18 0859 02/26/18 1025 02/26/18 1520  Weight: 68.8 kg 67.1 kg 67.8 kg    Examination: General exam: Conversant, in no acute distress Respiratory system: normal chest rise, clear, no audible wheezing  Data Reviewed: I have personally reviewed following labs and imaging studies  CBC: Recent Labs  Lab 02/23/18 1224  02/26/18 0425 02/27/18 0325 02/27/18 0605 02/27/18 1636 02/28/18 0435  WBC 20.3*   < > 9.7 9.3 10.6* 11.5* 16.2*  NEUTROABS 18.3*  --   --   --   --   --   --   HGB 13.0   < > 7.4* 8.0* 9.2* 10.4* 10.5*  HCT 39.2   < > 24.1* 25.9* 28.4* 31.4* 32.4*  MCV 95.1   < > 98.4 97.4 94.4 93.5 94.7  PLT 201   < > 125* 95* 132* 146* 175   < > = values in this interval not displayed.   Basic Metabolic Panel: Recent Labs  Lab 02/24/18 0521 02/24/18 1428 02/25/18 0348 02/26/18 1000 02/27/18 0605  NA 130* 132* 140 138 134*  K 6.4* 6.3* 4.3 4.2 3.3*  CL 92* 96* 102 104 97*  CO2 23 19* 25 23 26   GLUCOSE 50* 96 92 105* 107*  BUN 53* 65* 34* 43* 12  CREATININE 9.07* 9.48* 5.71* 7.68* 4.15*  CALCIUM 6.5* 6.6* 7.2* 7.0* 7.3*  MG  --   --  1.9  --   --   PHOS  --   --   --  4.7*  --    GFR: Estimated Creatinine Clearance: 11.6 mL/min (A) (by C-G formula based on SCr of 4.15 mg/dL (H)). Liver Function Tests: Recent Labs  Lab 02/23/18 1224 02/26/18 1000  AST 50*  --   ALT 36  --   ALKPHOS 110  --   BILITOT 0.5  --   PROT 7.0   --   ALBUMIN 2.8* 1.7*   Recent Labs  Lab 02/23/18 1224  LIPASE 35   No results for input(s): AMMONIA in the last 168 hours. Coagulation Profile: Recent Labs  Lab 02/26/18 1907  INR 1.17   Cardiac Enzymes: No results for input(s): CKTOTAL, CKMB, CKMBINDEX, TROPONINI in the last 168 hours. BNP (last 3 results) No results for input(s): PROBNP in the last 8760 hours. HbA1C: No results for input(s): HGBA1C in the last 72 hours. CBG: Recent Labs  Lab 02/27/18 1957 02/27/18 2340 02/28/18  2725 02/28/18 0809 02/28/18 1244  GLUCAP 97 102* 81 73 94   Lipid Profile: No results for input(s): CHOL, HDL, LDLCALC, TRIG, CHOLHDL, LDLDIRECT in the last 72 hours. Thyroid Function Tests: No results for input(s): TSH, T4TOTAL, FREET4, T3FREE, THYROIDAB in the last 72 hours. Anemia Panel: No results for input(s): VITAMINB12, FOLATE, FERRITIN, TIBC, IRON, RETICCTPCT in the last 72 hours. Sepsis Labs: Recent Labs  Lab 02/23/18 1415  LATICACIDVEN 1.76    Recent Results (from the past 240 hour(s))  MRSA PCR Screening     Status: None   Collection Time: 02/24/18 12:19 AM  Result Value Ref Range Status   MRSA by PCR NEGATIVE NEGATIVE Final    Comment:        The GeneXpert MRSA Assay (FDA approved for NASAL specimens only), is one component of a comprehensive MRSA colonization surveillance program. It is not intended to diagnose MRSA infection nor to guide or monitor treatment for MRSA infections. Performed at [facility], [place] [address] [place], [place] [address]   Culture, blood (routine x 2)     Status: None (Preliminary result)   Collection Time: 02/24/18 10:15 AM  Result Value Ref Range Status   Specimen Description BLOOD RIGHT HAND  Final   Special Requests   Final    BOTTLES DRAWN AEROBIC ONLY Blood Culture adequate volume   Culture   Final    NO GROWTH 4 DAYS Performed at [facility], [place] [address] [place], [place] [address]    Report Status PENDING   Incomplete  Culture, blood (routine x 2)     Status: None (Preliminary result)   Collection Time: 02/24/18 10:18 AM  Result Value Ref Range Status   Specimen Description BLOOD RIGHT HAND  Final   Special Requests   Final    BOTTLES DRAWN AEROBIC ONLY Blood Culture adequate volume   Culture   Final    NO GROWTH 4 DAYS Performed at [facility], [place] [address], [place], [place] [address]    Report Status PENDING  Incomplete     Radiology Studies: US Biopsy (liver)  Result Date: 02/27/2018 INDICATION: 72 year old with an indeterminate liver lesion. Concern for mass versus abscess. EXAM: ULTRASOUND-GUIDED LIVER LESION BIOPSY MEDICATIONS: None. ANESTHESIA/SEDATION: Moderate (conscious) sedation was employed during this procedure. A total of Versed 0.5 mg and Fentanyl 25 mcg was administered intravenously. Moderate Sedation Time: 10 minutes. The patient's level of consciousness and vital signs were monitored continuously by radiology nursing throughout the procedure under my direct supervision. FLUOROSCOPY TIME:  None COMPLICATIONS: None immediate. PROCEDURE: Informed written consent was obtained from the patient after a thorough discussion of the procedural risks, benefits and alternatives. All questions were addressed. A timeout was performed prior to the initiation of the procedure. Liver was evaluated with ultrasound. Heterogeneous lesion in the right hepatic lobe adjacent to the gallbladder was identified. The right abdomen was prepped and draped in sterile fashion. Maximal barrier sterile technique was utilized including caps, mask, sterile gowns, sterile gloves, sterile drape, hand hygiene and skin antiseptic. Overlying skin was anesthetized with 1% lidocaine. 17 gauge coaxial needle was directed into this heterogeneous lesion with ultrasound guidance. No fluid could be aspirated from the 17 gauge coaxial needle. Three core biopsies were obtained with an 18 gauge core device. Solid core  specimens were obtained and placed in formalin. 17 gauge needle was removed without complication. Bandage placed over the puncture site. FINDINGS: Heterogeneous solid lesion in the right hepatic lobe.  No fluid could be aspirated from this lesion. Biopsy needle confirmed within the lesion. IMPRESSION: Ultrasound-guided core biopsies of the right hepatic mass. Electronically Signed   By: Markus Daft M.D.   On: 02/27/2018 15:22   Ir Abdomen US Limited  Result Date: 02/26/2018 CLINICAL DATA:  72 year old with an indeterminate liver lesion. EXAM: ULTRASOUND ABDOMEN LIMITED COMPARISON:  CT 02/25/2018 FINDINGS: Liver was evaluated with ultrasound. At the area of concern, there is a heterogeneous hypoechoic structure which appears solid rather than cystic. This lesion is adjacent to calcifications associated with a contracted and possibly abnormal gallbladder. Liver lesion is more suggestive for a solid lesion rather cystic collection or abscess. IMPRESSION: Dominant liver lesion is concerning for a neoplastic process. Plan for ultrasound-guided biopsy of this lesion. Electronically Signed   By: Markus Daft M.D.   On: 02/26/2018 21:48    Scheduled Meds: . insulin aspart  0-9 Units Subcutaneous Q4H  . insulin aspart  5 Units Subcutaneous Once  . mouth rinse  15 mL Mouth Rinse BID  . metoprolol tartrate  25 mg Oral BID  . multivitamin  1 tablet Oral QHS  . nicotine  14 mg Transdermal Q2000  . pantoprazole  40 mg Oral BID   Continuous Infusions: . sodium chloride Stopped (02/26/18 1607)  . dextrose 40 mL/hr at 02/27/18 1744  . [START ON 03/03/2018] ferric gluconate (FERRLECIT/NULECIT) IV    . piperacillin-tazobactam (ZOSYN)  IV Stopped (02/28/18 0700)  . [START ON 03/01/2018] vancomycin       LOS: 5 days   Marylu Lund, MD [facility] Pager On Amion  If 7PM-7AM, please contact night-coverage 02/28/2018, 5:50 PM

## 2018-02-28 NOTE — Progress Notes (Signed)
Daily Rounding Note  02/28/2018, 9:35 AM  LOS: 5 days   SUBJECTIVE:   Chief complaint: gi bleed, dark emesis.  Cecal diverticulitis vs mass.  Liver lesion.  Anemia  Still some periodic discomfort in her right lower quadrant.  No nausea.  Bowel movement this morning was dark and she saw a little bit of red coloring leaching out from the BM into the commode water. Feels pretty well  OBJECTIVE:         Vital signs in last 24 hours:    Temp:  [98.3 F (36.8 C)-103.2 F (39.6 C)] 98.5 F (36.9 C) (10/13 0811) Pulse Rate:  [73-112] 73 (10/13 0811) Resp:  [15-25] 16 (10/13 0811) BP: (111-178)/(34-83) 142/81 (10/13 0811) SpO2:  [92 %-100 %] 98 % (10/13 0811) Last BM Date: 02/27/18 Filed Weights   02/25/18 0859 02/26/18 1025 02/26/18 1520  Weight: 68.8 kg 67.1 kg 67.8 kg   General: Looks a bit tired but not ill Heart: RRR. Chest: Clear bilaterally. Abdomen: Soft.  Not distended.  Active bowel sounds.  Tender without guarding on the right mid to lower abdomen. Extremities: No CCE. Neuro/Psych: Alert.  Oriented x3.  Moves all 4 limbs.  No gross deficits.  Cooperative.  Intake/Output from previous day: No intake/output data recorded.  Intake/Output this shift: No intake/output data recorded.  Lab Results: Recent Labs    02/27/18 0605 02/27/18 1636 02/28/18 0435  WBC 10.6* 11.5* 16.2*  HGB 9.2* 10.4* 10.5*  HCT 28.4* 31.4* 32.4*  PLT 132* 146* 175   BMET Recent Labs    02/26/18 1000 02/27/18 0605  NA 138 134*  K 4.2 3.3*  CL 104 97*  CO2 23 26  GLUCOSE 105* 107*  BUN 43* 12  CREATININE 7.68* 4.15*  CALCIUM 7.0* 7.3*   LFT Recent Labs    02/26/18 1000  ALBUMIN 1.7*   PT/INR Recent Labs    02/26/18 1907  LABPROT 14.8  INR 1.17   Hepatitis Panel No results for input(s): HEPBSAG, HCVAB, HEPAIGM, HEPBIGM in the last 72 hours.  Studies/Results: US Biopsy (liver)  Result Date:  02/27/2018 INDICATION: 72 year old with an indeterminate liver lesion. Concern for mass versus abscess. EXAM: ULTRASOUND-GUIDED LIVER LESION BIOPSY MEDICATIONS: None. ANESTHESIA/SEDATION: Moderate (conscious) sedation was employed during this procedure. A total of Versed 0.5 mg and Fentanyl 25 mcg was administered intravenously. Moderate Sedation Time: 10 minutes. The patient's level of consciousness and vital signs were monitored continuously by radiology nursing throughout the procedure under my direct supervision. FLUOROSCOPY TIME:  None COMPLICATIONS: None immediate. PROCEDURE: Informed written consent was obtained from the patient after a thorough discussion of the procedural risks, benefits and alternatives. All questions were addressed. A timeout was performed prior to the initiation of the procedure. Liver was evaluated with ultrasound. Heterogeneous lesion in the right hepatic lobe adjacent to the gallbladder was identified. The right abdomen was prepped and draped in sterile fashion. Maximal barrier sterile technique was utilized including caps, mask, sterile gowns, sterile gloves, sterile drape, hand hygiene and skin antiseptic. Overlying skin was anesthetized with 1% lidocaine. 17 gauge coaxial needle was directed into this heterogeneous lesion with ultrasound guidance. No fluid could be aspirated from the 17 gauge coaxial needle. Three core biopsies were obtained with an 18 gauge core device. Solid core specimens were obtained and placed in formalin. 17 gauge needle was removed without complication. Bandage placed over the puncture site. FINDINGS: Heterogeneous solid lesion in the right hepatic lobe. No  fluid could be aspirated from this lesion. Biopsy needle confirmed within the lesion. IMPRESSION: Ultrasound-guided core biopsies of the right hepatic mass. Electronically Signed   By: Markus Daft M.D.   On: 02/27/2018 15:22   Ir Abdomen US Limited  Result Date: 02/26/2018 CLINICAL DATA:   72 year old with an indeterminate liver lesion. EXAM: ULTRASOUND ABDOMEN LIMITED COMPARISON:  CT 02/25/2018 FINDINGS: Liver was evaluated with ultrasound. At the area of concern, there is a heterogeneous hypoechoic structure which appears solid rather than cystic. This lesion is adjacent to calcifications associated with a contracted and possibly abnormal gallbladder. Liver lesion is more suggestive for a solid lesion rather cystic collection or abscess. IMPRESSION: Dominant liver lesion is concerning for a neoplastic process. Plan for ultrasound-guided biopsy of this lesion. Electronically Signed   By: Markus Daft M.D.   On: 02/26/2018 21:48    ASSESMENT:   *Acute GI bleed. Dark emesis. IV Protonix BID >> Protonix po BID. Nuclear medicine bleeding scan 10/9:Bleeding at second hour in the left abdomen, favoring small bowel source. CTA 10/10:Eccentric cecal wall thickening, possibly a mass with associated diverticula and adjacent inflammation. Findings most likely due to diverticulitis. No visualized bleeding although the cecal findings are most likely source of bleeding. Liver masses in the right lobe,differential includes abscesses. Other diverticula within the colon. Gallstones and left intra-renal stones. Aortic atherosclerosis. Enteroscopy canceled 10/10due to acute altered mental status, tachycardia.    *Acute blood loss anemia. 2 units PRBCs 10/11. Hgb 7 >> 8 >> 10.5.    *Cystic liver lesions, concerning for abscesses vs neoplasm.   Minor elevation AST, O/w normal LFTs.   Ultrasound 10/11.   Dominant liver lesion concerning for a neoplasm. Biopsy liver mass 10/12: solid, not able to aspirate fluid.    *Cecal diverticulitis vs focal colitis, unable to rule out underlying neoplasia.  Previous colonoscopy, unknown date, at outside facilities reported as unremarkable by pt.    Zosyn day 5, Vanc ordered for today.  Blood cultures negative at 4 days. WBCs rising.     *ESRD. Hemodialysis MWF.   *   Hypokalemia.  No K level obtained today.     PLAN   *  Await liver pathology.   Eventually needs colonoscopy if she does not end up undergoing exlap/resection.  Suspect surgical decision rides on liver path: surgery if mass is malignant?   *  No plans for EGD, low suspicion for SB bleed despite nuc med findings.   Advance to full liquids.     Azucena Freed  02/28/2018, 9:35 AM Phone [phone]

## 2018-02-28 NOTE — Progress Notes (Addendum)
Subjective:   No new co/Right lower quad pain   Objective Vital signs in last 24 hours: Vitals:   02/28/18 0431 02/28/18 0449 02/28/18 0811 02/28/18 1231  BP: 125/66  (!) 142/81 138/77  Pulse:   73   Resp: 18  16 15   Temp:  98.3 F (36.8 C) 98.5 F (36.9 C) 98.9 F (37.2 C)  TempSrc:  Oral Oral Oral  SpO2:   98%   Weight:      Height:       Weight change:   Physical Exam: General: Alert , nad ,female . pleasant this am  Heart: RRR, no m,r,g Lungs: CTA Abdomen: BS , Soft, Tender R lower quad Extremities: trace bipedal edema  Dialysis Access: Pos bruit LA AVF   Home meds: - metoprolol 50 bid - MVI/ nicotine patch/ vitamins    OP Dialysis:MWF [facility] 4h 64.7kg 2/2.5 bath L AVF Hep 4000 + 2000 midrun - hect 2 ug - venofer 50/wk   Problem/Plan: 1.  RLQ abd pain/ N/V - Liver masses ? Neoplasm  Suspicious - Wu  per primary, GI seeing and also Surgery / 02/27/18  Biopsy of liver lesion Pending ,on Antibiotics  2. GI bleed/ anemia of ABL - Hb 11 >7's>10.5 this am 10/11/ 2units PRBCs in hd  8.0 today. SP +nuclearscan then negative CTA of bowel for bleed. EGD canceled by GI 10/10 2/2 AMS/Tachycardia 3. ESRD - HD MWF.HD  on schedule Hd in am .no hep.  4. Volume/HTN - up 3kg yest (BEDWT), BP's soft on hd  kept even on HD yest . . bp this  am stable  on Meto 25mg  bid  5. Anemia ckd and GI Bld. - no esa on admit , holding with ? CA  6. MBD ckd - cont vit d on HD  Ca Corec 9.3 , phos 4.7 no binders currently  7. HO Tobacco abuse  Ernest Haber, PA-C [facility] Beeper [phone] 02/28/2018,1:26 PM  LOS: 5 days   Pt seen, examined and agree w A/P as above.  [name] MD [facility] pager [phone]   02/28/2018, 1:42 PM    Labs: Basic Metabolic Panel: Recent Labs  Lab 02/25/18 0348 02/26/18 1000 02/27/18 0605  NA 140 138 134*  K 4.3 4.2 3.3*  CL 102 104 97*  CO2 25 23 26   GLUCOSE 92 105* 107*   BUN 34* 43* 12  CREATININE 5.71* 7.68* 4.15*  CALCIUM 7.2* 7.0* 7.3*  PHOS  --  4.7*  --    Liver Function Tests: Recent Labs  Lab 02/23/18 1224 02/26/18 1000  AST 50*  --   ALT 36  --   ALKPHOS 110  --   BILITOT 0.5  --   PROT 7.0  --   ALBUMIN 2.8* 1.7*   Recent Labs  Lab 02/23/18 1224  LIPASE 35   No results for input(s): AMMONIA in the last 168 hours. CBC: Recent Labs  Lab 02/23/18 1224  02/26/18 0425 02/27/18 0325 02/27/18 0605 02/27/18 1636 02/28/18 0435  WBC 20.3*   < > 9.7 9.3 10.6* 11.5* 16.2*  NEUTROABS 18.3*  --   --   --   --   --   --   HGB 13.0   < > 7.4* 8.0* 9.2* 10.4* 10.5*  HCT 39.2   < > 24.1* 25.9* 28.4* 31.4* 32.4*  MCV 95.1   < > 98.4 97.4 94.4 93.5 94.7  PLT 201   < > 125* 95*  132* 146* 175   < > = values in this interval not displayed.   Cardiac Enzymes: No results for input(s): CKTOTAL, CKMB, CKMBINDEX, TROPONINI in the last 168 hours. CBG: Recent Labs  Lab 02/27/18 1957 02/27/18 2340 02/28/18 0443 02/28/18 0809 02/28/18 1244  GLUCAP 97 102* 81 73 94    Medications: . sodium chloride Stopped (02/26/18 0637)  . dextrose 40 mL/hr at 02/27/18 1744  . piperacillin-tazobactam (ZOSYN)  IV Stopped (02/28/18 0700)  . [START ON 03/01/2018] vancomycin     . insulin aspart  0-9 Units Subcutaneous Q4H  . insulin aspart  5 Units Subcutaneous Once  . mouth rinse  15 mL Mouth Rinse BID  . metoprolol tartrate  25 mg Oral BID  . multivitamin  1 tablet Oral QHS  . nicotine  14 mg Transdermal Q2000  . pantoprazole  40 mg Oral BID

## 2018-02-28 NOTE — Progress Notes (Signed)
[facility] Progress Note  3 Days Post-Op  Subjective: CC-  No new complaints. Continues to have RLQ pain, no better or worse than yesterday. Denies n/v. Tolerating clear liquids. BM yesterday blood-tinged. Hg stable today. TMAX 103.2 and WBC up to 16.2 from 11.5 today.  Liver biopsy yesterday revealed solid mass, concern for possible gallbladder carcinoma or colon cancer with met. Awaiting pathology.  Objective: Vital signs in last 24 hours: Temp:  [98.3 F (36.8 C)-103.2 F (39.6 C)] 98.5 F (36.9 C) (10/13 0811) Pulse Rate:  [73-112] 73 (10/13 0811) Resp:  [15-25] 16 (10/13 0811) BP: (111-178)/(34-83) 142/81 (10/13 0811) SpO2:  [92 %-100 %] 98 % (10/13 0811) Last BM Date: 02/27/18  Intake/Output from previous day: No intake/output data recorded. Intake/Output this shift: No intake/output data recorded.  PE: PE: Gen:  Alert, NAD HEENT: EOM's intact, pupils equal and round Card:  RRR Pulm:  CTAB, no W/R/R, effort normal Abd: Soft, ND, focal TTP RLQ with voluntary guarding, +BS, no HSM, no hernia Skin: no rashes noted, warm and dry  Lab Results:  Recent Labs    02/27/18 1636 02/28/18 0435  WBC 11.5* 16.2*  HGB 10.4* 10.5*  HCT 31.4* 32.4*  PLT 146* 175   BMET Recent Labs    02/26/18 1000 02/27/18 0605  NA 138 134*  K 4.2 3.3*  CL 104 97*  CO2 23 26  GLUCOSE 105* 107*  BUN 43* 12  CREATININE 7.68* 4.15*  CALCIUM 7.0* 7.3*   PT/INR Recent Labs    02/26/18 1907  LABPROT 14.8  INR 1.17   CMP     Component Value Date/Time   NA 134 (L) 02/27/2018 0605   K 3.3 (L) 02/27/2018 0605   CL 97 (L) 02/27/2018 0605   CO2 26 02/27/2018 0605   GLUCOSE 107 (H) 02/27/2018 0605   BUN 12 02/27/2018 0605   CREATININE 4.15 (H) 02/27/2018 0605   CALCIUM 7.3 (L) 02/27/2018 0605   PROT 7.0 02/23/2018 1224   ALBUMIN 1.7 (L) 02/26/2018 1000   AST 50 (H) 02/23/2018 1224   ALT 36 02/23/2018 1224   ALKPHOS 110 02/23/2018 1224   BILITOT 0.5  02/23/2018 1224   GFRNONAA 10 (L) 02/27/2018 0605   GFRAA 11 (L) 02/27/2018 0605   Lipase     Component Value Date/Time   LIPASE 35 02/23/2018 1224       Studies/Results: US Biopsy (liver)  Result Date: 02/27/2018 INDICATION: 72 year old with an indeterminate liver lesion. Concern for mass versus abscess. EXAM: ULTRASOUND-GUIDED LIVER LESION BIOPSY MEDICATIONS: None. ANESTHESIA/SEDATION: Moderate (conscious) sedation was employed during this procedure. A total of Versed 0.5 mg and Fentanyl 25 mcg was administered intravenously. Moderate Sedation Time: 10 minutes. The patient's level of consciousness and vital signs were monitored continuously by radiology nursing throughout the procedure under my direct supervision. FLUOROSCOPY TIME:  None COMPLICATIONS: None immediate. PROCEDURE: Informed written consent was obtained from the patient after a thorough discussion of the procedural risks, benefits and alternatives. All questions were addressed. A timeout was performed prior to the initiation of the procedure. Liver was evaluated with ultrasound. Heterogeneous lesion in the right hepatic lobe adjacent to the gallbladder was identified. The right abdomen was prepped and draped in sterile fashion. Maximal barrier sterile technique was utilized including caps, mask, sterile gowns, sterile gloves, sterile drape, hand hygiene and skin antiseptic. Overlying skin was anesthetized with 1% lidocaine. 17 gauge coaxial needle was directed into this heterogeneous lesion with ultrasound guidance. No fluid could be aspirated  from the 17 gauge coaxial needle. Three core biopsies were obtained with an 18 gauge core device. Solid core specimens were obtained and placed in formalin. 17 gauge needle was removed without complication. Bandage placed over the puncture site. FINDINGS: Heterogeneous solid lesion in the right hepatic lobe. No fluid could be aspirated from this lesion. Biopsy needle confirmed within the lesion.  IMPRESSION: Ultrasound-guided core biopsies of the right hepatic mass. Electronically Signed   By: Markus Daft M.D.   On: 02/27/2018 15:22   Ir Abdomen US Limited  Result Date: 02/26/2018 CLINICAL DATA:  72 year old with an indeterminate liver lesion. EXAM: ULTRASOUND ABDOMEN LIMITED COMPARISON:  CT 02/25/2018 FINDINGS: Liver was evaluated with ultrasound. At the area of concern, there is a heterogeneous hypoechoic structure which appears solid rather than cystic. This lesion is adjacent to calcifications associated with a contracted and possibly abnormal gallbladder. Liver lesion is more suggestive for a solid lesion rather cystic collection or abscess. IMPRESSION: Dominant liver lesion is concerning for a neoplastic process. Plan for ultrasound-guided biopsy of this lesion. Electronically Signed   By: Markus Daft M.D.   On: 02/26/2018 21:48    Anti-infectives: Anti-infectives (From admission, onward)   Start     Dose/Rate Route Frequency Ordered Stop   02/23/18 1319  piperacillin-tazobactam (ZOSYN) IVPB 3.375 g     3.375 g 12.5 mL/hr over 240 Minutes Intravenous Every 12 hours 02/23/18 1319         Assessment/Plan End-stage renal disease on dialysis MWF Hypertension Ongoing tobacco use Hyperlipidemia  Right lower quadrant pain GI bleed Possible diverticulitisvs colitis vs neoplasm of the cecum Liver mass  - GI following, recommending colonoscopy when acute infection resolved - CTA negative 10/10 for a source of bleed - tagged RBC scan shows bleeding in L abdomen, likely SB source - s/p IR biopsy of liver lesion 10/12, path pending - Patient clinically about the same with RLQ tenderness, no peritonitis. However she is having persist fevers (TMAX 103.2) and WBC up to 16.2 today. Leukocytosis could be elevated due to procedure yesterday. Would like to wait for final pathology of liver biopsy prior to discussing any surgical intervention. Will discuss with MD.  ID - zosyn 10/8>> FEN  - IVF, CLD VTE - SCDs, no chemical DVT prophylaxis due to bleeding Foley - none   LOS: 5 days    Wellington Hampshire , [facility] 02/28/2018, 8:41 AM Pager: [phone] Mon 7:00 am -11:30 AM Tues-Fri 7:00 am-4:30 pm Sat-Sun 7:00 am-11:30 am

## 2018-03-01 ENCOUNTER — Other Ambulatory Visit (HOSPITAL_COMMUNITY): Payer: Medicare Other

## 2018-03-01 DIAGNOSIS — K922 Gastrointestinal hemorrhage, unspecified: Secondary | ICD-10-CM | POA: Diagnosis present

## 2018-03-01 LAB — GLUCOSE, CAPILLARY
GLUCOSE-CAPILLARY: 101 mg/dL — AB (ref 70–99)
GLUCOSE-CAPILLARY: 82 mg/dL (ref 70–99)
Glucose-Capillary: 111 mg/dL — ABNORMAL HIGH (ref 70–99)
Glucose-Capillary: 116 mg/dL — ABNORMAL HIGH (ref 70–99)
Glucose-Capillary: 94 mg/dL (ref 70–99)

## 2018-03-01 LAB — BASIC METABOLIC PANEL
Anion gap: 11 (ref 5–15)
BUN: 33 mg/dL — ABNORMAL HIGH (ref 8–23)
CALCIUM: 7.3 mg/dL — AB (ref 8.9–10.3)
CO2: 24 mmol/L (ref 22–32)
CREATININE: 8.28 mg/dL — AB (ref 0.44–1.00)
Chloride: 96 mmol/L — ABNORMAL LOW (ref 98–111)
GFR calc Af Amer: 5 mL/min — ABNORMAL LOW (ref >60)
GFR calc non Af Amer: 4 mL/min — ABNORMAL LOW (ref >60)
Glucose, Bld: 107 mg/dL — ABNORMAL HIGH (ref 70–99)
Potassium: 2.8 mmol/L — ABNORMAL LOW (ref 3.5–5.1)
Sodium: 131 mmol/L — ABNORMAL LOW (ref 135–145)

## 2018-03-01 LAB — CBC
HEMATOCRIT: 28 % — AB (ref 36.0–46.0)
Hemoglobin: 8.9 g/dL — ABNORMAL LOW (ref 12.0–15.0)
MCH: 30 pg (ref 26.0–34.0)
MCHC: 31.8 g/dL (ref 30.0–36.0)
MCV: 94.3 fL (ref 80.0–100.0)
NRBC: 0 % (ref 0.0–0.2)
PLATELETS: 236 10*3/uL (ref 150–400)
RBC: 2.97 MIL/uL — AB (ref 3.87–5.11)
RDW: 16.2 % — ABNORMAL HIGH (ref 11.5–15.5)
WBC: 18.1 10*3/uL — AB (ref 4.0–10.5)

## 2018-03-01 LAB — CA 125: Cancer Antigen (CA) 125: 35.4 U/mL (ref 0.0–38.1)

## 2018-03-01 LAB — CEA: CEA1: 8.8 ng/mL — AB (ref 0.0–4.7)

## 2018-03-01 NOTE — Progress Notes (Signed)
PROGRESS NOTE    Mary Chapman  GHW:[id number] DOB: 1946-04-08 DOA: 02/23/2018 PCP: [facility]    Brief Narrative:  72 y.o. female with medical history significant of h/o ESRD on HD due to FSGS; HTN; and tobacco dependence presenting with abdominal pain.   She developed abdominal pain, she usually has a high pain tolerance and she couldn't stand it anymore.  It was painful all night.  The pain started yesterday about noon.  She went to HD and felt ok.  RLQ pain mostly, although the pain is also diffuse.  It came on acutely and worsened.  +nausea and vomiting, starting last night and also this AM.  Last emesis was about 2pm.  Last BM was last evening and was loose; she had a total of 3-4 loose BMs yesterday (but she often has loose stools while on HD).  No fevers, no chills.  Her last colonoscopy was 3-4 years ago at [facility]; it was normal, to her knowledge.   ED Course: Pain started yesterday and worsened. WBC 20k. No fever, normal lactate. CT with cecal inflammation and diverticular changes - ?R-sided diverticulitis. Cannot r/o mass. Also with liver changes - abscess, neoplasm on ddx. Suggest MRI or CT with contrast. She was given Zosyn. Needs admission for intraabdominal infection. Surgery has not been called.  Assessment & Plan:   Principal Problem:   Diverticulitis Active Problems:   Tobacco abuse   Essential hypertension   Focal segmental glomerulosclerosis   Hyperlipidemia   ESRD on dialysis [facility])   Liver mass   Gastrointestinal hemorrhage associated with anorectal source   Liver abscess   GI bleed  Intraabdominal infection - Proximal diverticulitis, Liver abscess vs malignancy -Patient developed acute onset of RLQ pain prior to admit with n/v, no fever, +leukocytosis -CT personally reviewed. Findings suggestive of diverticulitis at level of cecum with findings of 4-5cm fluid collections in liver initially c/w liver abscess -General Surgery  consulted, recs noted. Currently on empiric abx -GI is following -Attempt at liver lesion drainage, however turned out to be solid lesion, thus multiple biopsies were obtained and are pending results -Per surgery, concerns for malignant process. Biopsy results pending at this time. If malignant, possible need for surgery and would need colon prior to surgery if indicated  ESRD on HD, due to FSGS -Patient on chronic MWF HD -Nephrology following -Continuing HD per Nephrology, tolerating thus far  HTN -BP now improving -Had initially resumed metoprolol at lower dose. -stable on metoprolol 25mg  bid (on 50mg  prior to admit)  Acute blood loss anemia -Earlier this admit, patient with large dark stools with resultant drop in 2-3 g of hgb -GI following. Did not do EGD 10/10 secondary to instability -Recent bleeding scan with evidence of bleeding in small bowels -CT with no acute bleed noted -Hgb thus far stable. Repeat cbc in AM -No signs of acute blood loss this AM  Tobacco abuse -Cessation was encouraged -Presently stable  Tachycardia -resolved -Suspect related to worsened anemia vs rebound tachycardia after holding metoprolol following recent hemorrhagic shock -tolerating beta blocker  DVT prophylaxis: SCD Code Status: Full Family Communication: Pt in room, family not at bedside Disposition Plan: Uncertain at this time  Consultants:   GI  General Surgery  IR  Procedures:     Antimicrobials: Anti-infectives (From admission, onward)   Start     Dose/Rate Route Frequency Ordered Stop   03/01/18 1200  vancomycin (VANCOCIN) IVPB 750 mg/150 ml premix     750 mg 150  mL/hr over 60 Minutes Intravenous Every M-W-F (Hemodialysis) 02/28/18 0913     02/28/18 0930  vancomycin (VANCOCIN) 1,750 mg in sodium chloride 0.9 % 500 mL IVPB     1,750 mg 250 mL/hr over 120 Minutes Intravenous  Once 02/28/18 0913 02/28/18 1323   02/23/18 1319  piperacillin-tazobactam (ZOSYN) IVPB 3.375  g     3.375 g 12.5 mL/hr over 240 Minutes Intravenous Every 12 hours 02/23/18 1319        Subjective: Without complaints  Objective: Vitals:   03/01/18 1432 03/01/18 1459 03/01/18 1500 03/01/18 1530  BP: 128/71 (!) 144/72 (!) 144/72 (!) 144/69  Pulse: 67 64 64 63  Resp: 16     Temp: 98.5 F (36.9 C)     TempSrc: Oral     SpO2:      Weight: 69.9 kg     Height:        Intake/Output Summary (Last 24 hours) at 03/01/2018 1652 Last data filed at 03/01/2018 0400 Gross per 24 hour  Intake 352.19 ml  Output -  Net 352.19 ml   Filed Weights   02/26/18 1025 02/26/18 1520 03/01/18 1432  Weight: 67.1 kg 67.8 kg 69.9 kg    Examination: General exam: Conversant, in no acute distress Respiratory system: normal chest rise, clear, no audible wheezing Cardiovascular system: regular rhythm, s1-s2 Gastrointestinal system: Nondistended, nontender, pos BS Central nervous system: No seizures, no tremors Extremities: No cyanosis, no joint deformities Skin: No rashes, no pallor Psychiatry: Affect normal // no auditory hallucinations   Data Reviewed: I have personally reviewed following labs and imaging studies  CBC: Recent Labs  Lab 02/23/18 1224  02/27/18 0325 02/27/18 0605 02/27/18 1636 02/28/18 0435 03/01/18 0445  WBC 20.3*   < > 9.3 10.6* 11.5* 16.2* 18.1*  NEUTROABS 18.3*  --   --   --   --   --   --   HGB 13.0   < > 8.0* 9.2* 10.4* 10.5* 8.9*  HCT 39.2   < > 25.9* 28.4* 31.4* 32.4* 28.0*  MCV 95.1   < > 97.4 94.4 93.5 94.7 94.3  PLT 201   < > 95* 132* 146* 175 236   < > = values in this interval not displayed.   Basic Metabolic Panel: Recent Labs  Lab 02/24/18 1428 02/25/18 0348 02/26/18 1000 02/27/18 0605 03/01/18 1513  NA 132* 140 138 134* 131*  K 6.3* 4.3 4.2 3.3* 2.8*  CL 96* 102 104 97* 96*  CO2 19* 25 23 26 24   GLUCOSE 96 92 105* 107* 107*  BUN 65* 34* 43* 12 33*  CREATININE 9.48* 5.71* 7.68* 4.15* 8.28*  CALCIUM 6.6* 7.2* 7.0* 7.3* 7.3*  MG  --  1.9   --   --   --   PHOS  --   --  4.7*  --   --    GFR: Estimated Creatinine Clearance: 5.8 mL/min (A) (by C-G formula based on SCr of 8.28 mg/dL (H)). Liver Function Tests: Recent Labs  Lab 02/23/18 1224 02/26/18 1000  AST 50*  --   ALT 36  --   ALKPHOS 110  --   BILITOT 0.5  --   PROT 7.0  --   ALBUMIN 2.8* 1.7*   Recent Labs  Lab 02/23/18 1224  LIPASE 35   No results for input(s): AMMONIA in the last 168 hours. Coagulation Profile: Recent Labs  Lab 02/26/18 1907  INR 1.17   Cardiac Enzymes: No results for input(s): CKTOTAL, CKMB, CKMBINDEX,  TROPONINI in the last 168 hours. BNP (last 3 results) No results for input(s): PROBNP in the last 8760 hours. HbA1C: No results for input(s): HGBA1C in the last 72 hours. CBG: Recent Labs  Lab 02/28/18 2212 03/01/18 0012 03/01/18 0449 03/01/18 0808 03/01/18 1235  GLUCAP 123* 116* 101* 82 111*   Lipid Profile: No results for input(s): CHOL, HDL, LDLCALC, TRIG, CHOLHDL, LDLDIRECT in the last 72 hours. Thyroid Function Tests: No results for input(s): TSH, T4TOTAL, FREET4, T3FREE, THYROIDAB in the last 72 hours. Anemia Panel: No results for input(s): VITAMINB12, FOLATE, FERRITIN, TIBC, IRON, RETICCTPCT in the last 72 hours. Sepsis Labs: Recent Labs  Lab 02/23/18 1415  LATICACIDVEN 1.76    Recent Results (from the past 240 hour(s))  MRSA PCR Screening     Status: None   Collection Time: 02/24/18 12:19 AM  Result Value Ref Range Status   MRSA by PCR NEGATIVE NEGATIVE Final    Comment:        The GeneXpert MRSA Assay (FDA approved for NASAL specimens only), is one component of a comprehensive MRSA colonization surveillance program. It is not intended to diagnose MRSA infection nor to guide or monitor treatment for MRSA infections. Performed at [facility], [place] [address], [place], [place] [address]   Culture, blood (routine x 2)     Status: None (Preliminary result)   Collection Time: 02/24/18 10:15 AM    Result Value Ref Range Status   Specimen Description BLOOD RIGHT HAND  Final   Special Requests   Final    BOTTLES DRAWN AEROBIC ONLY Blood Culture adequate volume   Culture   Final    NO GROWTH 4 DAYS Performed at [facility], [place] [address], [place], [place] [address]    Report Status PENDING  Incomplete  Culture, blood (routine x 2)     Status: None (Preliminary result)   Collection Time: 02/24/18 10:18 AM  Result Value Ref Range Status   Specimen Description BLOOD RIGHT HAND  Final   Special Requests   Final    BOTTLES DRAWN AEROBIC ONLY Blood Culture adequate volume   Culture   Final    NO GROWTH 4 DAYS Performed at [facility], [place] [address], [place], [place] [address]    Report Status PENDING  Incomplete     Radiology Studies: No results found.  Scheduled Meds: . insulin aspart  0-9 Units Subcutaneous Q4H  . insulin aspart  5 Units Subcutaneous Once  . mouth rinse  15 mL Mouth Rinse BID  . metoprolol tartrate  25 mg Oral BID  . multivitamin  1 tablet Oral QHS  . nicotine  14 mg Transdermal Q2000  . pantoprazole  40 mg Oral BID   Continuous Infusions: . sodium chloride Stopped (02/28/18 1635)  . dextrose 40 mL/hr at 03/01/18 0400  . [START ON 03/03/2018] ferric gluconate (FERRLECIT/NULECIT) IV    . piperacillin-tazobactam (ZOSYN)  IV 3.375 g (03/01/18 2831)  . vancomycin 750 mg (03/01/18 1406)     LOS: 6 days   Marylu Lund, MD [facility] Pager On Amion  If 7PM-7AM, please contact night-coverage 03/01/2018, 4:52 PM

## 2018-03-01 NOTE — Progress Notes (Signed)
[facility] Progress Note   Chief Complaint:   Dark emesis, liver masses, diverticulitis    SUBJECTIVE:  Pt feeling some better Waiting on liver bx results Abd pain overall is much improved compared to admit No overnight events   ASSESSMENT AND PLAN:   -GI bleed / dark emesis  /  ABL anemia, s/ p 2 units on 10/11. --enteroscopy cancelled 10/10 due to mental status changes and tachycardia,  -Bleeding scan 10/9 - bleeding at second hour in left abd favoring small bowel.  -CTA 10/10 -no evidence for arterial bleeding. Eccentric cecal wall thickening-enteroscopy cancelled 10/10 due to mental status changes and tachycardia,  with adjacent inflammation, mass vrs diverticulitis.  - No further overt bleeding  Cystic liver lesions, abscess? Malignancy? Biopsies results pending -on Zosyn , Vanco -depending on path she may not need colonoscopy - Followup liver bx path  Cecal diverticultis vrs focal colitis, malignancy not excluded (especially given abnormal cecum on imaging -on antibiotics -concern for malignancy   ESRD on HD   OBJECTIVE:     Vital signs in last 24 hours: Temp:  [98.9 F (37.2 C)] 98.9 F (37.2 C) (10/13 1231) Pulse Rate:  [78-83] 83 (10/14 0842) Resp:  [15] 15 (10/13 1231) BP: (125-138)/(74-77) 125/74 (10/14 0842) SpO2:  [96 %] 96 % (10/14 0842) Last BM Date: 02/27/18 General:   Alert, well-developed, in NAD EENT:  Normal hearing, non icteric sclera, conjunctive pink.  Heart:  Regular rate and rhythm; no murmurs. No lower extremity edema Pulm: Normal respiratory effort, lungs CTA bilaterally without wheezes or crackles. Abdomen:  Soft, mild tenderness to deep palp lower and right lower abd, nondistended.  Normal bowel sounds, no masses felt.     Neurologic:  Alert and  oriented x4;  grossly normal neurologically. Psych:  Pleasant, cooperative.  Normal mood and affect.   Intake/Output from previous day: 10/13 0701 - 10/14 0700 In:  3335.2 [P.O.:240; I.V.:2489.1; IV Piggyback:606.1] Out: -  Intake/Output this shift: No intake/output data recorded.  Lab Results: Recent Labs    02/27/18 1636 02/28/18 0435 03/01/18 0445  WBC 11.5* 16.2* 18.1*  HGB 10.4* 10.5* 8.9*  HCT 31.4* 32.4* 28.0*  PLT 146* 175 236   BMET Recent Labs    02/27/18 0605  NA 134*  K 3.3*  CL 97*  CO2 26  GLUCOSE 107*  BUN 12  CREATININE 4.15*  CALCIUM 7.3*   LFT No results for input(s): PROT, ALBUMIN, AST, ALT, ALKPHOS, BILITOT, BILIDIR, IBILI in the last 72 hours. PT/INR Recent Labs    02/26/18 1907  LABPROT 14.8  INR 1.17   Hepatitis Panel No results for input(s): HEPBSAG, HCVAB, HEPAIGM, HEPBIGM in the last 72 hours.  US Biopsy (liver)  Result Date: 02/27/2018 INDICATION: 72 year old with an indeterminate liver lesion. Concern for mass versus abscess. EXAM: ULTRASOUND-GUIDED LIVER LESION BIOPSY MEDICATIONS: None. ANESTHESIA/SEDATION: Moderate (conscious) sedation was employed during this procedure. A total of Versed 0.5 mg and Fentanyl 25 mcg was administered intravenously. Moderate Sedation Time: 10 minutes. The patient's level of consciousness and vital signs were monitored continuously by radiology nursing throughout the procedure under my direct supervision. FLUOROSCOPY TIME:  None COMPLICATIONS: None immediate. PROCEDURE: Informed written consent was obtained from the patient after a thorough discussion of the procedural risks, benefits and alternatives. All questions were addressed. A timeout was performed prior to the initiation of the procedure. Liver was evaluated with ultrasound. Heterogeneous lesion in the right hepatic lobe adjacent to the gallbladder was identified. The  right abdomen was prepped and draped in sterile fashion. Maximal barrier sterile technique was utilized including caps, mask, sterile gowns, sterile gloves, sterile drape, hand hygiene and skin antiseptic. Overlying skin was anesthetized with 1%  lidocaine. 17 gauge coaxial needle was directed into this heterogeneous lesion with ultrasound guidance. No fluid could be aspirated from the 17 gauge coaxial needle. Three core biopsies were obtained with an 18 gauge core device. Solid core specimens were obtained and placed in formalin. 17 gauge needle was removed without complication. Bandage placed over the puncture site. FINDINGS: Heterogeneous solid lesion in the right hepatic lobe. No fluid could be aspirated from this lesion. Biopsy needle confirmed within the lesion. IMPRESSION: Ultrasound-guided core biopsies of the right hepatic mass. Electronically Signed   By: Markus Daft M.D.   On: 02/27/2018 15:22     Principal Problem:   Diverticulitis Active Problems:   Tobacco abuse   Essential hypertension   Focal segmental glomerulosclerosis   Hyperlipidemia   ESRD on dialysis [facility])   Liver mass   Gastrointestinal hemorrhage associated with anorectal source   Liver abscess   GI bleed     LOS: 6 days

## 2018-03-01 NOTE — Progress Notes (Signed)
First attempt to call report for dialysis. They did not answer.

## 2018-03-01 NOTE — Progress Notes (Signed)
4 Days Post-Op   Subjective/Chief Complaint: No acute events overnight Path pending No blood in stool per pt   Objective: Vital signs in last 24 hours: Temp:  [98.9 F (37.2 C)] 98.9 F (37.2 C) (10/13 1231) Pulse Rate:  [78-83] 83 (10/14 0842) Resp:  [15] 15 (10/13 1231) BP: (125-138)/(74-77) 125/74 (10/14 0842) SpO2:  [96 %] 96 % (10/14 0842) Last BM Date: 02/27/18  Intake/Output from previous day: 10/13 0701 - 10/14 0700 In: 3335.2 [P.O.:240; I.V.:2489.1; IV Piggyback:606.1] Out: -  Intake/Output this shift: No intake/output data recorded.  Constitutional: No acute distress, conversant, appears states age. Eyes: Anicteric sclerae, moist conjunctiva, no lid lag Lungs: Clear to auscultation bilaterally, normal respiratory effort CV: regular rate and rhythm, no murmurs, no peripheral edema, pedal pulses 2+ GI: Soft, no masses or hepatosplenomegaly, non-tender to palpation in RLQ Skin: No rashes, palpation reveals normal turgor Psychiatric: appropriate judgment and insight, oriented to person, place, and time   Lab Results:  Recent Labs    02/28/18 0435 03/01/18 0445  WBC 16.2* 18.1*  HGB 10.5* 8.9*  HCT 32.4* 28.0*  PLT 175 236   BMET Recent Labs    02/26/18 1000 02/27/18 0605  NA 138 134*  K 4.2 3.3*  CL 104 97*  CO2 23 26  GLUCOSE 105* 107*  BUN 43* 12  CREATININE 7.68* 4.15*  CALCIUM 7.0* 7.3*   PT/INR Recent Labs    02/26/18 1907  LABPROT 14.8  INR 1.17   ABG No results for input(s): PHART, HCO3 in the last 72 hours.  Invalid input(s): PCO2, PO2  Studies/Results: US Biopsy (liver)  Result Date: 02/27/2018 INDICATION: 72 year old with an indeterminate liver lesion. Concern for mass versus abscess. EXAM: ULTRASOUND-GUIDED LIVER LESION BIOPSY MEDICATIONS: None. ANESTHESIA/SEDATION: Moderate (conscious) sedation was employed during this procedure. A total of Versed 0.5 mg and Fentanyl 25 mcg was administered intravenously. Moderate Sedation  Time: 10 minutes. The patient's level of consciousness and vital signs were monitored continuously by radiology nursing throughout the procedure under my direct supervision. FLUOROSCOPY TIME:  None COMPLICATIONS: None immediate. PROCEDURE: Informed written consent was obtained from the patient after a thorough discussion of the procedural risks, benefits and alternatives. All questions were addressed. A timeout was performed prior to the initiation of the procedure. Liver was evaluated with ultrasound. Heterogeneous lesion in the right hepatic lobe adjacent to the gallbladder was identified. The right abdomen was prepped and draped in sterile fashion. Maximal barrier sterile technique was utilized including caps, mask, sterile gowns, sterile gloves, sterile drape, hand hygiene and skin antiseptic. Overlying skin was anesthetized with 1% lidocaine. 17 gauge coaxial needle was directed into this heterogeneous lesion with ultrasound guidance. No fluid could be aspirated from the 17 gauge coaxial needle. Three core biopsies were obtained with an 18 gauge core device. Solid core specimens were obtained and placed in formalin. 17 gauge needle was removed without complication. Bandage placed over the puncture site. FINDINGS: Heterogeneous solid lesion in the right hepatic lobe. No fluid could be aspirated from this lesion. Biopsy needle confirmed within the lesion. IMPRESSION: Ultrasound-guided core biopsies of the right hepatic mass. Electronically Signed   By: Markus Daft M.D.   On: 02/27/2018 15:22    Anti-infectives: Anti-infectives (From admission, onward)   Start     Dose/Rate Route Frequency Ordered Stop   03/01/18 1200  vancomycin (VANCOCIN) IVPB 750 mg/150 ml premix     750 mg 150 mL/hr over 60 Minutes Intravenous Every M-W-F (Hemodialysis) 02/28/18 0913  02/28/18 0930  vancomycin (VANCOCIN) 1,750 mg in sodium chloride 0.9 % 500 mL IVPB     1,750 mg 250 mL/hr over 120 Minutes Intravenous  Once  02/28/18 0913 02/28/18 1323   02/23/18 1319  piperacillin-tazobactam (ZOSYN) IVPB 3.375 g     3.375 g 12.5 mL/hr over 240 Minutes Intravenous Every 12 hours 02/23/18 1319        Assessment/Plan: End-stage renal disease on dialysis MWF Hypertension Ongoing tobacco use Hyperlipidemia  Right lower quadrant pain GI bleed Possible diverticulitisvs colitis vs neoplasm of the cecum Liver mass -GI following-based on pathology of liver bx, may require colonoscopy prior to surgery to fully eval colon prior to surgery.    -CTA negative10/20for a sourceof bleed - tagged RBC scan shows bleeding in L abdomen, likely SB source - s/p IR biopsy of liver lesion 10/12, path pending - Patient clinically about the same with sporadic RLQ tenderness, no peritonitis.  WBC cont' to rise. Leukocytosis could be elevated due to procedure.  ID -zosyn 10/8>> FEN -IVF, CLD VTE -SCDs, no chemical DVT prophylaxis due to bleeding Foley -none   LOS: 6 days    Ralene Ok 03/01/2018

## 2018-03-01 NOTE — Progress Notes (Addendum)
[facility] Progress Note   Subjective:   No new complaints.  Pain well controlled. Denies SOB.   Objective Vitals:   02/28/18 0811 02/28/18 1231 03/01/18 0835 03/01/18 0842  BP: (!) 142/81 138/77 129/74 125/74  Pulse: 73  78 83  Resp: 16 15    Temp: 98.5 F (36.9 C) 98.9 F (37.2 C)    TempSrc: Oral Oral    SpO2: 98%   96%  Weight:      Height:       Physical Exam General:NAD, pleasant female, does not appear stated age Heart:RRR, no mrg Lungs:CTAB, BS decreased on R Abdomen:soft, +tenderness in RLQ Extremities:trace LE edema b/l Dialysis Access: LU AVF +b/t   Filed Weights   02/25/18 0859 02/26/18 1025 02/26/18 1520  Weight: 68.8 kg 67.1 kg 67.8 kg    Intake/Output Summary (Last 24 hours) at 03/01/2018 1012 Last data filed at 03/01/2018 0400 Gross per 24 hour  Intake 3335.17 ml  Output -  Net 3335.17 ml    Additional Objective Labs: Basic Metabolic Panel: Recent Labs  Lab 02/25/18 0348 02/26/18 1000 02/27/18 0605  NA 140 138 134*  K 4.3 4.2 3.3*  CL 102 104 97*  CO2 25 23 26   GLUCOSE 92 105* 107*  BUN 34* 43* 12  CREATININE 5.71* 7.68* 4.15*  CALCIUM 7.2* 7.0* 7.3*  PHOS  --  4.7*  --    Liver Function Tests: Recent Labs  Lab 02/23/18 1224 02/26/18 1000  AST 50*  --   ALT 36  --   ALKPHOS 110  --   BILITOT 0.5  --   PROT 7.0  --   ALBUMIN 2.8* 1.7*   Recent Labs  Lab 02/23/18 1224  LIPASE 35   CBC: Recent Labs  Lab 02/23/18 1224  02/27/18 0325 02/27/18 0605 02/27/18 1636 02/28/18 0435 03/01/18 0445  WBC 20.3*   < > 9.3 10.6* 11.5* 16.2* 18.1*  NEUTROABS 18.3*  --   --   --   --   --   --   HGB 13.0   < > 8.0* 9.2* 10.4* 10.5* 8.9*  HCT 39.2   < > 25.9* 28.4* 31.4* 32.4* 28.0*  MCV 95.1   < > 97.4 94.4 93.5 94.7 94.3  PLT 201   < > 95* 132* 146* 175 236   < > = values in this interval not displayed.   Blood Culture    Component Value Date/Time   SDES BLOOD RIGHT HAND 02/24/2018 1018   SPECREQUEST   02/24/2018 1018    BOTTLES DRAWN AEROBIC ONLY Blood Culture adequate volume   CULT  02/24/2018 1018    NO GROWTH 4 DAYS Performed at [facility], [place] [address] [place], [place] [address]    REPTSTATUS PENDING 02/24/2018 1018    CBG: Recent Labs  Lab 02/28/18 1638 02/28/18 2212 03/01/18 0012 03/01/18 0449 03/01/18 0808  GLUCAP 93 123* 116* 101* 82   Studies/Results: US Biopsy (liver)  Result Date: 02/27/2018 INDICATION: 72 year old with an indeterminate liver lesion. Concern for mass versus abscess. EXAM: ULTRASOUND-GUIDED LIVER LESION BIOPSY MEDICATIONS: None. ANESTHESIA/SEDATION: Moderate (conscious) sedation was employed during this procedure. A total of Versed 0.5 mg and Fentanyl 25 mcg was administered intravenously. Moderate Sedation Time: 10 minutes. The patient's level of consciousness and vital signs were monitored continuously by radiology nursing throughout the procedure under my direct supervision. FLUOROSCOPY TIME:  None COMPLICATIONS: None immediate. PROCEDURE: Informed written consent was obtained from the patient after a  thorough discussion of the procedural risks, benefits and alternatives. All questions were addressed. A timeout was performed prior to the initiation of the procedure. Liver was evaluated with ultrasound. Heterogeneous lesion in the right hepatic lobe adjacent to the gallbladder was identified. The right abdomen was prepped and draped in sterile fashion. Maximal barrier sterile technique was utilized including caps, mask, sterile gowns, sterile gloves, sterile drape, hand hygiene and skin antiseptic. Overlying skin was anesthetized with 1% lidocaine. 17 gauge coaxial needle was directed into this heterogeneous lesion with ultrasound guidance. No fluid could be aspirated from the 17 gauge coaxial needle. Three core biopsies were obtained with an 18 gauge core device. Solid core specimens were obtained and placed in formalin. 17 gauge needle was removed  without complication. Bandage placed over the puncture site. FINDINGS: Heterogeneous solid lesion in the right hepatic lobe. No fluid could be aspirated from this lesion. Biopsy needle confirmed within the lesion. IMPRESSION: Ultrasound-guided core biopsies of the right hepatic mass. Electronically Signed   By: Markus Daft M.D.   On: 02/27/2018 15:22    Medications: . sodium chloride Stopped (02/28/18 1635)  . dextrose 40 mL/hr at 03/01/18 0400  . [START ON 03/03/2018] ferric gluconate (FERRLECIT/NULECIT) IV    . piperacillin-tazobactam (ZOSYN)  IV 3.375 g (03/01/18 4287)  . vancomycin     . insulin aspart  0-9 Units Subcutaneous Q4H  . insulin aspart  5 Units Subcutaneous Once  . mouth rinse  15 mL Mouth Rinse BID  . metoprolol tartrate  25 mg Oral BID  . multivitamin  1 tablet Oral QHS  . nicotine  14 mg Transdermal Q2000  . pantoprazole  40 mg Oral BID    Dialysis Orders: MWF [facility] 4h 64.7kg 2/2.5 bath L AVF Hep 4000 + 2000 midrun - hect 2 ug - venofer 50/wk  Home meds: - metoprolol 50 bid - MVI/ nicotine patch/ vitamins  Assessment/Plan: 1. RLQ pain/n/v - liver masses - suspicious for neoplasm - BX 10/12 pending. GI and surgery following. Per surgery may need colonoscopy prior to surgery. On ABX.  2. ESRD - on HD MWF. last K 3.3. Orders written for HD today per regular schedule, NO HEPARIN. 3. Acute on chronic AOCKD- GIB - nuclear bleeding scan showed source in L abdomen. CTA showed no acute bleeding. GI following.  Hgb 8.9 today, s/p 2 Unit pRBC on 10/11.  4. Secondary hyperparathyroidism - Last CCa 9.3, phos 4.7, no binders. Cont VDRA, 5. HTN/volume - BP well controlled. Does not appear volume overloaded no exam, 3kg over EDW per bed weights.  Net UF goal today 1L.  Need standing weights to better assess edw.  6. Nutrition - Renal diet with fluid restrictions once advanced.   Jen Mow, PA-C [facility] Pager:  [phone] 03/01/2018,10:12 AM  LOS: 6 days   Pt seen, examined and agree w A/P as above.  [name] MD [employer] pager [phone]   03/01/2018, 4:04 PM

## 2018-03-02 LAB — BASIC METABOLIC PANEL
Anion gap: 12 (ref 5–15)
BUN: 15 mg/dL (ref 8–23)
CHLORIDE: 98 mmol/L (ref 98–111)
CO2: 25 mmol/L (ref 22–32)
Calcium: 7.5 mg/dL — ABNORMAL LOW (ref 8.9–10.3)
Creatinine, Ser: 5.04 mg/dL — ABNORMAL HIGH (ref 0.44–1.00)
GFR calc Af Amer: 9 mL/min — ABNORMAL LOW (ref >60)
GFR calc non Af Amer: 8 mL/min — ABNORMAL LOW (ref >60)
Glucose, Bld: 93 mg/dL (ref 70–99)
POTASSIUM: 3.7 mmol/L (ref 3.5–5.1)
SODIUM: 135 mmol/L (ref 135–145)

## 2018-03-02 LAB — CBC
HEMATOCRIT: 30.9 % — AB (ref 36.0–46.0)
Hemoglobin: 9.7 g/dL — ABNORMAL LOW (ref 12.0–15.0)
MCH: 29.8 pg (ref 26.0–34.0)
MCHC: 31.4 g/dL (ref 30.0–36.0)
MCV: 95.1 fL (ref 80.0–100.0)
NRBC: 0 % (ref 0.0–0.2)
Platelets: 337 10*3/uL (ref 150–400)
RBC: 3.25 MIL/uL — ABNORMAL LOW (ref 3.87–5.11)
RDW: 16 % — AB (ref 11.5–15.5)
WBC: 16.7 10*3/uL — ABNORMAL HIGH (ref 4.0–10.5)

## 2018-03-02 LAB — CULTURE, BLOOD (ROUTINE X 2)
CULTURE: NO GROWTH
CULTURE: NO GROWTH
SPECIAL REQUESTS: ADEQUATE
Special Requests: ADEQUATE

## 2018-03-02 LAB — GLUCOSE, CAPILLARY
GLUCOSE-CAPILLARY: 95 mg/dL (ref 70–99)
Glucose-Capillary: 100 mg/dL — ABNORMAL HIGH (ref 70–99)
Glucose-Capillary: 105 mg/dL — ABNORMAL HIGH (ref 70–99)
Glucose-Capillary: 110 mg/dL — ABNORMAL HIGH (ref 70–99)
Glucose-Capillary: 117 mg/dL — ABNORMAL HIGH (ref 70–99)
Glucose-Capillary: 92 mg/dL (ref 70–99)

## 2018-03-02 LAB — MAGNESIUM: MAGNESIUM: 2 mg/dL (ref 1.7–2.4)

## 2018-03-02 MED ORDER — CHLORHEXIDINE GLUCONATE CLOTH 2 % EX PADS
6.0000 | MEDICATED_PAD | Freq: Every day | CUTANEOUS | Status: DC
  Administered 2018-03-05: 6 via TOPICAL

## 2018-03-02 NOTE — Progress Notes (Addendum)
[facility] Progress Note   Subjective:   Resting in bed.  States she feels ok.  Pain well controlled. No issues with dialysis yesterday.  No new complaints.   Objective Vitals:   03/01/18 1908 03/01/18 2038 03/02/18 0627 03/02/18 1253  BP: (!) 141/83 133/72 124/77 (!) 149/76  Pulse: 81 95 84 69  Resp: 16 18  16   Temp: (!) 97 F (36.1 C) 99.3 F (37.4 C) 98.1 F (36.7 C) 98.1 F (36.7 C)  TempSrc: Oral Oral Oral Oral  SpO2: 97% 95% 98% 96%  Weight: 67 kg     Height:       Physical Exam General:NAD, pleasant female, does not appear stated age Heart:RRR, no mrg Lungs:mostly CTAB, +crackles in RLL Abdomen:soft, +tenderness in RLQ, ND Extremities:no edema Dialysis Access: LU AVF +b/t   Filed Weights   02/26/18 1520 03/01/18 1432 03/01/18 1908  Weight: 67.8 kg 69.9 kg 67 kg    Intake/Output Summary (Last 24 hours) at 03/02/2018 1310 Last data filed at 03/02/2018 0900 Gross per 24 hour  Intake 570 ml  Output 3009 ml  Net -2439 ml    Additional Objective Labs: Basic Metabolic Panel: Recent Labs  Lab 02/26/18 1000 02/27/18 0605 03/01/18 1513 03/02/18 0452  NA 138 134* 131* 135  K 4.2 3.3* 2.8* 3.7  CL 104 97* 96* 98  CO2 23 26 24 25   GLUCOSE 105* 107* 107* 93  BUN 43* 12 33* 15  CREATININE 7.68* 4.15* 8.28* 5.04*  CALCIUM 7.0* 7.3* 7.3* 7.5*  PHOS 4.7*  --   --   --    Liver Function Tests: Recent Labs  Lab 02/26/18 1000  ALBUMIN 1.7*   CBC: Recent Labs  Lab 02/27/18 0605 02/27/18 1636 02/28/18 0435 03/01/18 0445 03/02/18 0452  WBC 10.6* 11.5* 16.2* 18.1* 16.7*  HGB 9.2* 10.4* 10.5* 8.9* 9.7*  HCT 28.4* 31.4* 32.4* 28.0* 30.9*  MCV 94.4 93.5 94.7 94.3 95.1  PLT 132* 146* 175 236 337   Blood Culture    Component Value Date/Time   SDES BLOOD RIGHT HAND 02/24/2018 1018   SPECREQUEST  02/24/2018 1018    BOTTLES DRAWN AEROBIC ONLY Blood Culture adequate volume   CULT  02/24/2018 1018    NO GROWTH 4 DAYS Performed at [facility], [address], [place], [place] [address]    REPTSTATUS PENDING 02/24/2018 1018    CBG: Recent Labs  Lab 03/01/18 2033 03/02/18 0053 03/02/18 0429 03/02/18 0804 03/02/18 1153  GLUCAP 94 117* 100* 92 105*   Studies/Results: No results found.  Medications: . sodium chloride Stopped (02/28/18 1635)  . dextrose 40 mL/hr at 03/01/18 0400  . [START ON 03/03/2018] ferric gluconate (FERRLECIT/NULECIT) IV    . piperacillin-tazobactam (ZOSYN)  IV 3.375 g (03/02/18 0354)  . vancomycin 750 mg (03/01/18 1406)   . insulin aspart  0-9 Units Subcutaneous Q4H  . insulin aspart  5 Units Subcutaneous Once  . mouth rinse  15 mL Mouth Rinse BID  . metoprolol tartrate  25 mg Oral BID  . multivitamin  1 tablet Oral QHS  . nicotine  14 mg Transdermal Q2000  . pantoprazole  40 mg Oral BID    Dialysis Orders: MWF [facility] 4h 64.7kg 2/2.5 bath L AVF Hep 4000 + 2000 midrun - hect 2 ug - venofer 50/wk  Home meds: - metoprolol 50 bid - MVI/ nicotine patch/ vitamins  Assessment/Plan: 1. RLQ pain/n/v - liver masses - suspicious for neoplasm - BX  10/12 pending. GI and surgery following. Per surgery may need colonoscopy prior to surgery. On ABX.  2. ESRD - on HD MWF. last K 3.7. Orders written for HD tomorrow per regular schedule. NO HEPARIN. 3. Acute on chronic AOCKD- GIB - nuclear bleeding scan showed source in L abdomen. CTA showed no acute bleeding. GI following.  Hgb 9.7 today, s/p 2 Unit pRBC on 10/11.  4. Secondary hyperparathyroidism - Last CCa 9.3, phos 4.7, no binders. Cont VDRA. 5. HTN/volume - BP variable, mostly well controlled. Does not appear volume overloaded no exam. 3L over EDW per weights.  3L net UF removed yesterday. Plan for HD tomorrow for 3L goal. 6. Nutrition - Alb low. Renal diet with fluid restrictions once advanced. Nepro.   Jen Mow, PA-C [facility] Pager: [phone] 03/02/2018,1:10 PM  LOS: 7 days    Pt seen, examined and agree w A/P as above.  [name] MD [employer] pager [phone]   03/02/2018, 1:57 PM

## 2018-03-02 NOTE — Progress Notes (Signed)
PROGRESS NOTE    Mary Chapman  WNU:[id number] DOB: 1946-03-02 DOA: 02/23/2018 PCP: [facility]    Brief Narrative:  72 y.o. female with medical history significant of h/o ESRD on HD due to FSGS; HTN; and tobacco dependence presenting with abdominal pain.   She developed abdominal pain, she usually has a high pain tolerance and she couldn't stand it anymore.  It was painful all night.  The pain started yesterday about noon.  She went to HD and felt ok.  RLQ pain mostly, although the pain is also diffuse.  It came on acutely and worsened.  +nausea and vomiting, starting last night and also this AM.  Last emesis was about 2pm.  Last BM was last evening and was loose; she had a total of 3-4 loose BMs yesterday (but she often has loose stools while on HD).  No fevers, no chills.  Her last colonoscopy was 3-4 years ago at [facility] it was normal, to her knowledge.   ED Course: Pain started yesterday and worsened. WBC 20k. No fever, normal lactate. CT with cecal inflammation and diverticular changes - ?R-sided diverticulitis. Cannot r/o mass. Also with liver changes - abscess, neoplasm on ddx. Suggest MRI or CT with contrast. She was given Zosyn. Needs admission for intraabdominal infection. Surgery has not been called.  Assessment & Plan:   Principal Problem:   Diverticulitis Active Problems:   Tobacco abuse   Essential hypertension   Focal segmental glomerulosclerosis   Hyperlipidemia   ESRD on dialysis [facility])   Liver mass   Gastrointestinal hemorrhage associated with anorectal source   Liver abscess   GI bleed  Intraabdominal infection - Proximal diverticulitis, Liver abscess vs malignancy -Patient developed acute onset of RLQ pain prior to admit with n/v, no fever, +leukocytosis -CT personally reviewed. Findings suggestive of diverticulitis at level of cecum with findings of 4-5cm fluid collections in liver initially c/w liver abscess -General Surgery  consulted, recs noted. Currently on empiric abx -GI is following -Attempt at liver lesion drainage, however turned out to be solid lesion, thus multiple biopsies were obtained. Biopsy results remain pending -Per surgery, concerns for malignant process. Biopsy results pending at this time. If malignant, possible need for surgery and would need colon prior to surgery if indicated  ESRD on HD, due to FSGS -Patient on chronic MWF HD -Nephrology following -Continuing HD per Nephrology, tolerating thus far  HTN -BP now improving -Had initially resumed metoprolol at lower dose. -stable on metoprolol 25mg  bid (on 50mg  prior to admit)  Acute blood loss anemia -Earlier this admit, patient with large dark stools with resultant drop in 2-3 g of hgb -GI following. Did not do EGD 10/10 secondary to instability -Recent bleeding scan with evidence of bleeding in small bowels -CT with no acute bleed noted -Hgb thus far stable. Recheck cbc in AM -No signs of acute blood loss this AM  Tobacco abuse -Cessation was encouraged -Presently stable  Tachycardia -resolved -Suspect related to worsened anemia vs rebound tachycardia after holding metoprolol following recent hemorrhagic shock -tolerating beta blocker  DVT prophylaxis: SCD Code Status: Full Family Communication: Pt in room, family not at bedside Disposition Plan: Uncertain at this time  Consultants:   GI  General Surgery  IR  Procedures:     Antimicrobials: Anti-infectives (From admission, onward)   Start     Dose/Rate Route Frequency Ordered Stop   03/01/18 1200  vancomycin (VANCOCIN) IVPB 750 mg/150 ml premix     750 mg 150  mL/hr over 60 Minutes Intravenous Every M-W-F (Hemodialysis) 02/28/18 0913     02/28/18 0930  vancomycin (VANCOCIN) 1,750 mg in sodium chloride 0.9 % 500 mL IVPB     1,750 mg 250 mL/hr over 120 Minutes Intravenous  Once 02/28/18 0913 02/28/18 1323   02/23/18 1319  piperacillin-tazobactam (ZOSYN)  IVPB 3.375 g     3.375 g 12.5 mL/hr over 240 Minutes Intravenous Every 12 hours 02/23/18 1319        Subjective: No complaints this AM  Objective: Vitals:   03/01/18 1908 03/01/18 2038 03/02/18 0627 03/02/18 1253  BP: (!) 141/83 133/72 124/77 (!) 149/76  Pulse: 81 95 84 69  Resp: 16 18  16   Temp: (!) 97 F (36.1 C) 99.3 F (37.4 C) 98.1 F (36.7 C) 98.1 F (36.7 C)  TempSrc: Oral Oral Oral Oral  SpO2: 97% 95% 98% 96%  Weight: 67 kg     Height:        Intake/Output Summary (Last 24 hours) at 03/02/2018 1710 Last data filed at 03/02/2018 1500 Gross per 24 hour  Intake 620 ml  Output 3009 ml  Net -2389 ml   Filed Weights   02/26/18 1520 03/01/18 1432 03/01/18 1908  Weight: 67.8 kg 69.9 kg 67 kg    Examination: General exam: Awake, laying in bed, in nad Respiratory system: Normal respiratory effort, no wheezing Cardiovascular system: regular rate, s1, s2  Data Reviewed: I have personally reviewed following labs and imaging studies  CBC: Recent Labs  Lab 02/27/18 0605 02/27/18 1636 02/28/18 0435 03/01/18 0445 03/02/18 0452  WBC 10.6* 11.5* 16.2* 18.1* 16.7*  HGB 9.2* 10.4* 10.5* 8.9* 9.7*  HCT 28.4* 31.4* 32.4* 28.0* 30.9*  MCV 94.4 93.5 94.7 94.3 95.1  PLT 132* 146* 175 236 944   Basic Metabolic Panel: Recent Labs  Lab 02/25/18 0348 02/26/18 1000 02/27/18 0605 03/01/18 1513 03/02/18 0452  NA 140 138 134* 131* 135  K 4.3 4.2 3.3* 2.8* 3.7  CL 102 104 97* 96* 98  CO2 25 23 26 24 25   GLUCOSE 92 105* 107* 107* 93  BUN 34* 43* 12 33* 15  CREATININE 5.71* 7.68* 4.15* 8.28* 5.04*  CALCIUM 7.2* 7.0* 7.3* 7.3* 7.5*  MG 1.9  --   --   --  2.0  PHOS  --  4.7*  --   --   --    GFR: Estimated Creatinine Clearance: 9.6 mL/min (A) (by C-G formula based on SCr of 5.04 mg/dL (H)). Liver Function Tests: Recent Labs  Lab 02/26/18 1000  ALBUMIN 1.7*   No results for input(s): LIPASE, AMYLASE in the last 168 hours. No results for input(s): AMMONIA in the  last 168 hours. Coagulation Profile: Recent Labs  Lab 02/26/18 1907  INR 1.17   Cardiac Enzymes: No results for input(s): CKTOTAL, CKMB, CKMBINDEX, TROPONINI in the last 168 hours. BNP (last 3 results) No results for input(s): PROBNP in the last 8760 hours. HbA1C: No results for input(s): HGBA1C in the last 72 hours. CBG: Recent Labs  Lab 03/01/18 2033 03/02/18 0053 03/02/18 0429 03/02/18 0804 03/02/18 1153  GLUCAP 94 117* 100* 92 105*   Lipid Profile: No results for input(s): CHOL, HDL, LDLCALC, TRIG, CHOLHDL, LDLDIRECT in the last 72 hours. Thyroid Function Tests: No results for input(s): TSH, T4TOTAL, FREET4, T3FREE, THYROIDAB in the last 72 hours. Anemia Panel: No results for input(s): VITAMINB12, FOLATE, FERRITIN, TIBC, IRON, RETICCTPCT in the last 72 hours. Sepsis Labs: No results for input(s): PROCALCITON, LATICACIDVEN in  the last 168 hours.  Recent Results (from the past 240 hour(s))  MRSA PCR Screening     Status: None   Collection Time: 02/24/18 12:19 AM  Result Value Ref Range Status   MRSA by PCR NEGATIVE NEGATIVE Final    Comment:        The GeneXpert MRSA Assay (FDA approved for NASAL specimens only), is one component of a comprehensive MRSA colonization surveillance program. It is not intended to diagnose MRSA infection nor to guide or monitor treatment for MRSA infections. Performed at [facility], [place] [address], [place], [place] [address]   Culture, blood (routine x 2)     Status: None   Collection Time: 02/24/18 10:15 AM  Result Value Ref Range Status   Specimen Description BLOOD RIGHT HAND  Final   Special Requests   Final    BOTTLES DRAWN AEROBIC ONLY Blood Culture adequate volume   Culture   Final    NO GROWTH 6 DAYS Performed at [facility], [place] [address], [place], [place] [address]    Report Status 03/02/2018 FINAL  Final  Culture, blood (routine x 2)     Status: None   Collection Time: 02/24/18 10:18 AM  Result  Value Ref Range Status   Specimen Description BLOOD RIGHT HAND  Final   Special Requests   Final    BOTTLES DRAWN AEROBIC ONLY Blood Culture adequate volume   Culture   Final    NO GROWTH 6 DAYS Performed at [facility], [place] [address] [place], [place] [address]    Report Status 03/02/2018 FINAL  Final  Culture, blood (routine x 2)     Status: None (Preliminary result)   Collection Time: 02/28/18  9:33 AM  Result Value Ref Range Status   Specimen Description BLOOD BLOOD LEFT HAND  Final   Special Requests   Final    BOTTLES DRAWN AEROBIC ONLY Blood Culture results may not be optimal due to an inadequate volume of blood received in culture bottles   Culture   Final    NO GROWTH 2 DAYS Performed at [facility], [place] [address], [place], [place] [address]    Report Status PENDING  Incomplete  Culture, blood (routine x 2)     Status: None (Preliminary result)   Collection Time: 02/28/18  9:42 AM  Result Value Ref Range Status   Specimen Description BLOOD BLOOD RIGHT HAND  Final   Special Requests   Final    BOTTLES DRAWN AEROBIC ONLY Blood Culture adequate volume   Culture   Final    NO GROWTH 2 DAYS Performed at [facility], [place] [address] [place],  [address]    Report Status PENDING  Incomplete     Radiology Studies: No results found.  Scheduled Meds: . [START ON 03/03/2018] Chlorhexidine Gluconate Cloth  6 each Topical Q0600  . insulin aspart  0-9 Units Subcutaneous Q4H  . insulin aspart  5 Units Subcutaneous Once  . mouth rinse  15 mL Mouth Rinse BID  . metoprolol tartrate  25 mg Oral BID  . multivitamin  1 tablet Oral QHS  . nicotine  14 mg Transdermal Q2000  . pantoprazole  40 mg Oral BID   Continuous Infusions: . sodium chloride Stopped (02/28/18 1635)  . dextrose 40 mL/hr at 03/01/18 0400  . [START ON 03/03/2018] ferric gluconate (FERRLECIT/NULECIT) IV    . piperacillin-tazobactam (ZOSYN)  IV 3.375 g (03/02/18 3474)  .  vancomycin 750  mg (03/01/18 1406)     LOS: 7 days   Marylu Lund, MD [facility] Pager On Amion  If 7PM-7AM, please contact night-coverage 03/02/2018, 5:10 PM

## 2018-03-02 NOTE — Progress Notes (Signed)
5 Days Post-Op    CC: right abdominal pain  Subjective: She still has pain and it is primirarlyl RLQ.   Pain medicines help, food does not seem to affect it.   Objective: Vital signs in last 24 hours: Temp:  [97 F (36.1 C)-99.3 F (37.4 C)] 98.1 F (36.7 C) (10/15 0627) Pulse Rate:  [63-95] 84 (10/15 0627) Resp:  [16-18] 18 (10/14 2038) BP: (114-150)/(59-83) 124/77 (10/15 0627) SpO2:  [95 %-98 %] 98 % (10/15 0627) Weight:  [67 kg-69.9 kg] 67 kg (10/14 1908) Last BM Date: 03/01/18 150 PO recorded Urine x 1 HD 3009 Afebrile, VSS WBC 16.7 today Path still pending  Intake/Output from previous day: 10/14 0701 - 10/15 0700 In: 150 [P.O.:150] Out: 3009  Intake/Output this shift: No intake/output data recorded.  General appearance: alert, cooperative, appears stated age and no distress Resp: clear to auscultation bilaterally GI: soft, most tender RLQ.  tolerating diet.  Lab Results:  Recent Labs    03/01/18 0445 03/02/18 0452  WBC 18.1* 16.7*  HGB 8.9* 9.7*  HCT 28.0* 30.9*  PLT 236 337    BMET Recent Labs    03/01/18 1513  NA 131*  K 2.8*  CL 96*  CO2 24  GLUCOSE 107*  BUN 33*  CREATININE 8.28*  CALCIUM 7.3*   PT/INR No results for input(s): LABPROT, INR in the last 72 hours.  Recent Labs  Lab 02/23/18 1224 02/26/18 1000  AST 50*  --   ALT 36  --   ALKPHOS 110  --   BILITOT 0.5  --   PROT 7.0  --   ALBUMIN 2.8* 1.7*     Lipase     Component Value Date/Time   LIPASE 35 02/23/2018 1224     Medications: . insulin aspart  0-9 Units Subcutaneous Q4H  . insulin aspart  5 Units Subcutaneous Once  . mouth rinse  15 mL Mouth Rinse BID  . metoprolol tartrate  25 mg Oral BID  . multivitamin  1 tablet Oral QHS  . nicotine  14 mg Transdermal Q2000  . pantoprazole  40 mg Oral BID    Assessment/Plan  End-stage renal disease on dialysisMWF Hypertension Ongoing tobacco use Hyperlipidemia  Right lower quadrant pain GI bleed Possible  diverticulitisvs colitis vs neoplasm of the cecum Liver mass -GI following-based on pathology of liver bx, may require colonoscopy prior to surgery to fully eval colon prior to surgery.    -CTA negative10/4for a sourceof bleed - tagged RBC scan shows bleeding in L abdomen, likely SB source -s/pIR biopsy of liver lesion10/12, path pending - Patient clinically about the same with sporadic RLQ tenderness, no peritonitis.  WBC cont' to rise. Leukocytosis could be elevated due to procedure.  ID -zosyn 10/8>> FEN -IVF, full liquids VTE -SCDs, no chemical DVT prophylaxis due to bleeding Foley -none  Plan:  Awaiting Pathology to determine next step.    LOS: 7 days    Mary Chapman 03/02/2018 [phone]

## 2018-03-03 LAB — CBC
HCT: 30.3 % — ABNORMAL LOW (ref 36.0–46.0)
HEMOGLOBIN: 9.6 g/dL — AB (ref 12.0–15.0)
MCH: 30.5 pg (ref 26.0–34.0)
MCHC: 31.7 g/dL (ref 30.0–36.0)
MCV: 96.2 fL (ref 80.0–100.0)
Platelets: 400 10*3/uL (ref 150–400)
RBC: 3.15 MIL/uL — ABNORMAL LOW (ref 3.87–5.11)
RDW: 15.9 % — ABNORMAL HIGH (ref 11.5–15.5)
WBC: 14.6 10*3/uL — AB (ref 4.0–10.5)
nRBC: 0 % (ref 0.0–0.2)

## 2018-03-03 LAB — GLUCOSE, CAPILLARY
GLUCOSE-CAPILLARY: 65 mg/dL — AB (ref 70–99)
GLUCOSE-CAPILLARY: 76 mg/dL (ref 70–99)
GLUCOSE-CAPILLARY: 79 mg/dL (ref 70–99)
GLUCOSE-CAPILLARY: 96 mg/dL (ref 70–99)
Glucose-Capillary: 75 mg/dL (ref 70–99)
Glucose-Capillary: 81 mg/dL (ref 70–99)

## 2018-03-03 MED ORDER — HEPARIN SODIUM (PORCINE) 1000 UNIT/ML DIALYSIS
1000.0000 [IU] | INTRAMUSCULAR | Status: DC | PRN
Start: 2018-03-03 — End: 2018-03-03

## 2018-03-03 MED ORDER — LIDOCAINE HCL (PF) 1 % IJ SOLN: 5.0000 mL | INTRAMUSCULAR | Status: DC | PRN

## 2018-03-03 MED ORDER — SODIUM CHLORIDE 0.9 % IV SOLN
100.0000 mL | INTRAVENOUS | Status: DC | PRN
Start: 2018-03-03 — End: 2018-03-03

## 2018-03-03 MED ORDER — LIDOCAINE-PRILOCAINE 2.5-2.5 % EX CREA: 1.0000 "application " | TOPICAL_CREAM | CUTANEOUS | Status: DC | PRN

## 2018-03-03 MED ORDER — PENTAFLUOROPROP-TETRAFLUOROETH EX AERO: 1.0000 "application " | INHALATION_SPRAY | CUTANEOUS | Status: DC | PRN

## 2018-03-03 MED ORDER — SODIUM CHLORIDE 0.9 % IV SOLN: 100.0000 mL | INTRAVENOUS | Status: DC | PRN

## 2018-03-03 MED ORDER — ALTEPLASE 2 MG IJ SOLR: 2.0000 mg | Freq: Once | INTRAMUSCULAR | Status: DC | PRN

## 2018-03-03 MED ORDER — VANCOMYCIN HCL IN DEXTROSE 750-5 MG/150ML-% IV SOLN
INTRAVENOUS | Status: AC
  Administered 2018-03-03: 750 mg via INTRAVENOUS
  Filled 2018-03-03: qty 150

## 2018-03-03 NOTE — Progress Notes (Signed)
Pharmacy Antibiotic Note  Mary Chapman is a 72 y.o. female admitted on 02/23/2018 with abdominal pain. Patient has a PMH significant for ESRD on HD due to FSGS, HTN, tobacco dependence.   Patient's tMAX 103.2, wbc 16.2 up from 11.5. Pharmacy has been consulted for vancomycin dosing. Patient has been on zosyn since 10/8. No growth on cultures to date. Patient day #9 of zosyn and Day #4 of vancomycin.    Plan: Continue Vancomycin 750mg  IV every HD session on MWF. Continue Zosyn 3.375mg  every 12 hours - 4 hour infusion. Monitor clinical improvement, signs/symptoms of infection, LOT, vancomycin troughs as indicated  Height: 5\' 6"  (167.6 cm) Weight: 149 lb 11.1 oz (67.9 kg) IBW/kg (Calculated) : 59.3  Temp (24hrs), Avg:98.1 F (36.7 C), Min:97.9 F (36.6 C), Max:98.6 F (37 C)  Recent Labs  Lab 02/25/18 0348  02/26/18 1000  02/27/18 0605 02/27/18 1636 02/28/18 0435 03/01/18 0445 03/01/18 1513 03/02/18 0452 03/03/18 0422  WBC 10.6*   < >  --    < > 10.6* 11.5* 16.2* 18.1*  --  16.7* 14.6*  CREATININE 5.71*  --  7.68*  --  4.15*  --   --   --  8.28* 5.04*  --    < > = values in this interval not displayed.    Estimated Creatinine Clearance: 9.6 mL/min (A) (by C-G formula based on SCr of 5.04 mg/dL (H)).    No Known Allergies  Antimicrobials this admission: 10/8 zosyn >>  10/13 vancomycin >>   Dose adjustments this admission: n/a  Microbiology results: 10/09 BCx: NGTD 10/13 BCx: NGTD 10/09 MRSA PCR: NEG  Asharia Lotter A. Levada Dy, PharmD, [place] Pager: [phone] Please utilize Amion for appropriate phone number to reach the unit pharmacist ([facility])

## 2018-03-03 NOTE — Progress Notes (Signed)
Still awaiting read of liver biopsies obtained 10/12 Pathology secretary says MD was waiting on special stains, these are completed as of this AM and slides should be read sometime today.   Once we have pathology surgery can make decision re: laparoscopy, colon resection and/or GI pursuing colnoscopy in pt with cecal diverticulitis and/or mass.    Azucena Freed PA-C

## 2018-03-03 NOTE — Progress Notes (Signed)
Hypoglycemic Event  CBG: 65  Treatment: 15 GM carbohydrate snack  Symptoms: None  Follow-up CBG: Time:  CBG Result: 75  Possible Reasons for Event: Inadequate meal intake  Comments/MD notified: NP Schorr    Gildardo Griffes

## 2018-03-03 NOTE — Progress Notes (Signed)
6 Days Post-Op    CC: Right-sided abdominal pain  Subjective: Pain is significantly better today.  She is on HD but less discomfort than she has had since admit.    Objective: Vital signs in last 24 hours: Temp:  [97.9 F (36.6 C)-98.6 F (37 C)] 97.9 F (36.6 C) (10/16 0807) Pulse Rate:  [69-81] 72 (10/16 1000) Resp:  [11-18] 11 (10/16 0807) BP: (84-158)/(52-87) 143/87 (10/16 1000) SpO2:  [95 %-99 %] 95 % (10/16 0807) Weight:  [67.9 kg] 67.9 kg (10/16 0807) Last BM Date: 03/02/18 820 p.o. Nothing else recorded in the I&O. Afebrile vital signs are stable WBC down to 14.6, slowly improving  Intake/Output from previous day: 10/15 0701 - 10/16 0700 In: 820 [P.O.:720; IV Piggyback:100] Out: 0  Intake/Output this shift: No intake/output data recorded.  General appearance: alert, cooperative and no distress Resp: clear to auscultation bilaterally GI: almost no tenderness lying in bed, on HD currrently, but over all feels much better.  Lab Results:  Recent Labs    03/02/18 0452 03/03/18 0422  WBC 16.7* 14.6*  HGB 9.7* 9.6*  HCT 30.9* 30.3*  PLT 337 400    BMET Recent Labs    03/01/18 1513 03/02/18 0452  NA 131* 135  K 2.8* 3.7  CL 96* 98  CO2 24 25  GLUCOSE 107* 93  BUN 33* 15  CREATININE 8.28* 5.04*  CALCIUM 7.3* 7.5*   PT/INR No results for input(s): LABPROT, INR in the last 72 hours.  Recent Labs  Lab 02/26/18 1000  ALBUMIN 1.7*     Lipase     Component Value Date/Time   LIPASE 35 02/23/2018 1224     Medications: . Chlorhexidine Gluconate Cloth  6 each Topical Q0600  . insulin aspart  0-9 Units Subcutaneous Q4H  . insulin aspart  5 Units Subcutaneous Once  . mouth rinse  15 mL Mouth Rinse BID  . metoprolol tartrate  25 mg Oral BID  . multivitamin  1 tablet Oral QHS  . nicotine  14 mg Transdermal Q2000  . pantoprazole  40 mg Oral BID    Assessment/Plan  End-stage renal disease on dialysisMWF Hypertension Ongoing tobacco  use Hyperlipidemia  Right lower quadrant pain GI bleed Possible diverticulitisvs colitis vs neoplasm of the cecum Liver mass -GI following-based on pathology of liver bx, may require colonoscopy prior to surgery to fully eval colon prior to surgery. -CTA negative10/42for a sourceof bleed - tagged RBC scan shows bleeding in L abdomen, likely SB source -s/pIR biopsy of liver lesion10/12, path pending - Patient clinically about the same withsporadicRLQ tenderness, no peritonitis. WBC cont' to rise. Leukocytosis could be elevated due to procedure.  ID -zosyn 10/8>> FEN -IVF, full liquids VTE -SCDs, no chemical DVT prophylaxis due to bleeding Foley -none   Plan:  Still waiting on pathology.  She is much better.  LOS: 8 days    Mary Chapman 03/03/2018 [phone]

## 2018-03-03 NOTE — Progress Notes (Addendum)
[facility] Progress Note   Subjective:   Patient seen in dialysis.  Abdominal pain has improved, now only 1 tender spot in RLQ.  No new complaints.  Objective Vitals:   03/03/18 0930 03/03/18 1000 03/03/18 1030 03/03/18 1100  BP: 136/65 (!) 143/87 (!) 149/92 (!) 157/87  Pulse: 77 72 71 73  Resp:      Temp:      TempSrc:      SpO2:      Weight:      Height:       Physical Exam General:NAD, pleasant female, does not appear stated age Heart:RRR, no mrg Lungs:CTAB Abdomen:soft, ND, +tenderness in RLQ Extremities:no edema Dialysis Access: LU AVF cannulated   Filed Weights   03/01/18 1432 03/01/18 1908 03/03/18 0807  Weight: 69.9 kg 67 kg 67.9 kg    Intake/Output Summary (Last 24 hours) at 03/03/2018 1126 Last data filed at 03/03/2018 0200 Gross per 24 hour  Intake 400 ml  Output 0 ml  Net 400 ml    Additional Objective Labs: Basic Metabolic Panel: Recent Labs  Lab 02/26/18 1000 02/27/18 0605 03/01/18 1513 03/02/18 0452  NA 138 134* 131* 135  K 4.2 3.3* 2.8* 3.7  CL 104 97* 96* 98  CO2 23 26 24 25   GLUCOSE 105* 107* 107* 93  BUN 43* 12 33* 15  CREATININE 7.68* 4.15* 8.28* 5.04*  CALCIUM 7.0* 7.3* 7.3* 7.5*  PHOS 4.7*  --   --   --    Liver Function Tests: Recent Labs  Lab 02/26/18 1000  ALBUMIN 1.7*   CBC: Recent Labs  Lab 02/27/18 1636 02/28/18 0435 03/01/18 0445 03/02/18 0452 03/03/18 0422  WBC 11.5* 16.2* 18.1* 16.7* 14.6*  HGB 10.4* 10.5* 8.9* 9.7* 9.6*  HCT 31.4* 32.4* 28.0* 30.9* 30.3*  MCV 93.5 94.7 94.3 95.1 96.2  PLT 146* 175 236 337 400   CBG: Recent Labs  Lab 03/03/18 0018 03/03/18 0401 03/03/18 0444 03/03/18 0740 03/03/18 0856  GLUCAP 79 65* 75 81 96   Studies/Results: No results found.  Medications: . sodium chloride Stopped (02/28/18 1635)  . sodium chloride    . sodium chloride    . dextrose 40 mL/hr at 03/01/18 0400  . ferric gluconate (FERRLECIT/NULECIT) IV 62.5 mg (03/03/18 0942)  .  piperacillin-tazobactam (ZOSYN)  IV 3.375 g (03/02/18 2249)  . Vancomycin    . vancomycin 750 mg (03/01/18 1406)   . Chlorhexidine Gluconate Cloth  6 each Topical Q0600  . insulin aspart  0-9 Units Subcutaneous Q4H  . insulin aspart  5 Units Subcutaneous Once  . mouth rinse  15 mL Mouth Rinse BID  . metoprolol tartrate  25 mg Oral BID  . multivitamin  1 tablet Oral QHS  . nicotine  14 mg Transdermal Q2000  . pantoprazole  40 mg Oral BID    Dialysis Orders: MWF [facility] 4h 64.7kg 2/2.5 bath L AVF Hep 4000 + 2000 midrun - hect 2 ug - venofer 50/wk  Home meds: - metoprolol 50 bid - MVI/ nicotine patch/ vitamins  Assessment/Plan: 1.RLQ pain/n/v - Pain improved. liver masses - suspicious for neoplasm - BX 10/12 pending. GI and surgery following. Per surgery may need colonoscopy prior to surgery. On ABX. 2. ESRD -on HD MWF. last K 3.7. NO HEPARIN. 3.Acute on chronicAOCKD-GIB - nuclear bleeding scan showed source in L abdomen. CTA showed no acute bleeding. GI following. Hgb 9.6 today, s/p 2 Unit pRBC on 10/11.  4. Secondary hyperparathyroidism -Last CCa 9.3,  phos 4.7, no binders. Cont VDRA. 5. HTN/volume -BP elevated, expect improvement post HD. Does not appear volume overloaded no exam. Should meet EDW post HD, net UF goal 3L.  6. Nutrition - Alb low.Renal diet with fluid restrictions once advanced. Nepro  Jen Mow, PA-C [facility] Pager: [phone] 03/03/2018,11:26 AM  LOS: 8 days   Pt seen, examined and agree w A/P as above.  [name] MD [employer] pager [phone]   03/03/2018, 12:35 PM

## 2018-03-03 NOTE — Progress Notes (Addendum)
PROGRESS NOTE        PATIENT DETAILS Name: Mary Chapman Age: 72 y.o. Sex: female Date of Birth: 1946/03/24 Admit Date: 02/23/2018 Admitting Physician Karmen Bongo, MD NOB:[id number], [facility]  Brief Narrative: Patient is a 72 y.o. female with history of ESRD on HD presented with abdominal pain-further work-up revealed possible malignancy versus infection in the cecum area.  CT scan also showed numerous masses in the liver-these were initially felt to be cystic in nature, however upon further evaluation by IR-these were thought to be solid and more consistent with malignancy.  Subsequently underwent ultrasound-guided biopsy on 10/12.  General surgery, GI continue to follow.  Subjective: No chest pain or shortness of breath.  Lying comfortably in bed.Continues to have RLQ pain  Assessment/Plan: Cecal infection versus malignancy: Work-up in progress-initially liver lesions were thought to be liver abscesses-however upon further evaluation by IR these were thought to be solid lesions, underwent biopsy on 10/12.  Awaiting results-this will guide further management.  GI/general surgery following. Will stop Vanco-as blood cx neg, continue Zosyn until final bx results obtained.  Melena with acute blood loss anemia: Resolved-bleeding scan on 10/9 showed bleeding in the left abdomen favoring small bowel-required 2 units of PRBC.  Hemoglobin stable.  GI following.  ESRD: On HD-nephrology following and directing care.  Hypertension: Controlled, continue metoprolol  Tobacco use: Counseled  DVT Prophylaxis: SCD's  Code Status: Full code   Family Communication: None at bedside  Disposition Plan: Remain inpatient  Antimicrobial agents: Anti-infectives (From admission, onward)   Start     Dose/Rate Route Frequency Ordered Stop   03/01/18 1200  vancomycin (VANCOCIN) IVPB 750 mg/150 ml premix     750 mg 150 mL/hr over 60 Minutes Intravenous Every M-W-F  (Hemodialysis) 02/28/18 0913     02/28/18 0930  vancomycin (VANCOCIN) 1,750 mg in sodium chloride 0.9 % 500 mL IVPB     1,750 mg 250 mL/hr over 120 Minutes Intravenous  Once 02/28/18 0913 02/28/18 1323   02/23/18 1319  piperacillin-tazobactam (ZOSYN) IVPB 3.375 g     3.375 g 12.5 mL/hr over 240 Minutes Intravenous Every 12 hours 02/23/18 1319        Procedures: None  CONSULTS:  GI, nephrology and general surgery  Time spent: 25 minutes-Greater than 50% of this time was spent in counseling, explanation of diagnosis, planning of further management, and coordination of care.  MEDICATIONS: Scheduled Meds: . Chlorhexidine Gluconate Cloth  6 each Topical Q0600  . insulin aspart  0-9 Units Subcutaneous Q4H  . insulin aspart  5 Units Subcutaneous Once  . mouth rinse  15 mL Mouth Rinse BID  . metoprolol tartrate  25 mg Oral BID  . multivitamin  1 tablet Oral QHS  . nicotine  14 mg Transdermal Q2000  . pantoprazole  40 mg Oral BID   Continuous Infusions: . sodium chloride Stopped (02/28/18 1635)  . dextrose 40 mL/hr at 03/01/18 0400  . ferric gluconate (FERRLECIT/NULECIT) IV 62.5 mg (03/03/18 0942)  . piperacillin-tazobactam (ZOSYN)  IV 3.375 g (03/03/18 1317)  . vancomycin 750 mg (03/03/18 1144)   PRN Meds:.sodium chloride, acetaminophen **OR** acetaminophen, calcium carbonate (dosed in mg elemental calcium), camphor-menthol **AND** hydrOXYzine, docusate sodium, feeding supplement (NEPRO CARB STEADY), HYDROmorphone (DILAUDID) injection, labetalol, sorbitol, zolpidem   PHYSICAL EXAM: Vital signs: Vitals:   03/03/18 1130 03/03/18 1200 03/03/18 1230 03/03/18 1319  BP: (!) 142/84 (!) 145/79 (!) 142/78 (!) 148/87  Pulse: 76 79 75   Resp:      Temp:      TempSrc:      SpO2:      Weight:      Height:       Filed Weights   03/01/18 1432 03/01/18 1908 03/03/18 0807  Weight: 69.9 kg 67 kg 67.9 kg   Body mass index is 24.16 kg/m.   General appearance :Awake, alert, not in any  distress.  Eyes:Pink conjunctiva HEENT: Atraumatic and Normocephalic Neck: supple, no JVD.  Resp:Good air entry bilaterally, no added sounds  CVS: S1 S2 regular, no murmurs.  GI: Bowel sounds present, Non tender and not distended with no gaurding, rigidity or rebound. Extremities: B/L Lower Ext shows no edema Neurology:Non focal Musculoskeletal:No digital cyanosis Skin:No Rash, warm and dry Wounds:N/A  I have personally reviewed following labs and imaging studies  LABORATORY DATA: CBC: Recent Labs  Lab 02/27/18 1636 02/28/18 0435 03/01/18 0445 03/02/18 0452 03/03/18 0422  WBC 11.5* 16.2* 18.1* 16.7* 14.6*  HGB 10.4* 10.5* 8.9* 9.7* 9.6*  HCT 31.4* 32.4* 28.0* 30.9* 30.3*  MCV 93.5 94.7 94.3 95.1 96.2  PLT 146* 175 236 337 633    Basic Metabolic Panel: Recent Labs  Lab 02/25/18 0348 02/26/18 1000 02/27/18 0605 03/01/18 1513 03/02/18 0452  NA 140 138 134* 131* 135  K 4.3 4.2 3.3* 2.8* 3.7  CL 102 104 97* 96* 98  CO2 25 23 26 24 25   GLUCOSE 92 105* 107* 107* 93  BUN 34* 43* 12 33* 15  CREATININE 5.71* 7.68* 4.15* 8.28* 5.04*  CALCIUM 7.2* 7.0* 7.3* 7.3* 7.5*  MG 1.9  --   --   --  2.0  PHOS  --  4.7*  --   --   --     GFR: Estimated Creatinine Clearance: 9.6 mL/min (A) (by C-G formula based on SCr of 5.04 mg/dL (H)).  Liver Function Tests: Recent Labs  Lab 02/26/18 1000  ALBUMIN 1.7*   No results for input(s): LIPASE, AMYLASE in the last 168 hours. No results for input(s): AMMONIA in the last 168 hours.  Coagulation Profile: Recent Labs  Lab 02/26/18 1907  INR 1.17    Cardiac Enzymes: No results for input(s): CKTOTAL, CKMB, CKMBINDEX, TROPONINI in the last 168 hours.  BNP (last 3 results) No results for input(s): PROBNP in the last 8760 hours.  HbA1C: No results for input(s): HGBA1C in the last 72 hours.  CBG: Recent Labs  Lab 03/03/18 0401 03/03/18 0444 03/03/18 0740 03/03/18 0856 03/03/18 1324  GLUCAP 65* 75 81 96 76    Lipid  Profile: No results for input(s): CHOL, HDL, LDLCALC, TRIG, CHOLHDL, LDLDIRECT in the last 72 hours.  Thyroid Function Tests: No results for input(s): TSH, T4TOTAL, FREET4, T3FREE, THYROIDAB in the last 72 hours.  Anemia Panel: No results for input(s): VITAMINB12, FOLATE, FERRITIN, TIBC, IRON, RETICCTPCT in the last 72 hours.  Urine analysis:    Component Value Date/Time   COLORURINE YELLOW 03/14/2017 2330   APPEARANCEUR CLOUDY (A) 03/14/2017 2330   LABSPEC 1.020 03/14/2017 2330   PHURINE 6.0 03/14/2017 2330   GLUCOSEU 100 (A) 03/14/2017 2330   HGBUR MODERATE (A) 03/14/2017 2330   BILIRUBINUR NEGATIVE 03/14/2017 2330   KETONESUR NEGATIVE 03/14/2017 2330   PROTEINUR >300 (A) 03/14/2017 2330   NITRITE NEGATIVE 03/14/2017 2330   LEUKOCYTESUR NEGATIVE 03/14/2017 2330    Sepsis Labs: Lactic Acid, Venous  Component Value Date/Time   LATICACIDVEN 1.76 02/23/2018 1415    MICROBIOLOGY: Recent Results (from the past 240 hour(s))  MRSA PCR Screening     Status: None   Collection Time: 02/24/18 12:19 AM  Result Value Ref Range Status   MRSA by PCR NEGATIVE NEGATIVE Final    Comment:        The GeneXpert MRSA Assay (FDA approved for NASAL specimens only), is one component of a comprehensive MRSA colonization surveillance program. It is not intended to diagnose MRSA infection nor to guide or monitor treatment for MRSA infections. Performed at [facility], [place] [address], [place], [place] [address]   Culture, blood (routine x 2)     Status: None   Collection Time: 02/24/18 10:15 AM  Result Value Ref Range Status   Specimen Description BLOOD RIGHT HAND  Final   Special Requests   Final    BOTTLES DRAWN AEROBIC ONLY Blood Culture adequate volume   Culture   Final    NO GROWTH 6 DAYS Performed at [facility], [place] [address] [place], [place] [address]    Report Status 03/02/2018 FINAL  Final  Culture, blood (routine x 2)     Status: None   Collection Time:  02/24/18 10:18 AM  Result Value Ref Range Status   Specimen Description BLOOD RIGHT HAND  Final   Special Requests   Final    BOTTLES DRAWN AEROBIC ONLY Blood Culture adequate volume   Culture   Final    NO GROWTH 6 DAYS Performed at [facility], [place] [address], [place], [place] [address]    Report Status 03/02/2018 FINAL  Final  Culture, blood (routine x 2)     Status: None (Preliminary result)   Collection Time: 02/28/18  9:33 AM  Result Value Ref Range Status   Specimen Description BLOOD BLOOD LEFT HAND  Final   Special Requests   Final    BOTTLES DRAWN AEROBIC ONLY Blood Culture results may not be optimal due to an inadequate volume of blood received in culture bottles   Culture   Final    NO GROWTH 3 DAYS Performed at [facility], [place] [address], [place], [place] [address]    Report Status PENDING  Incomplete  Culture, blood (routine x 2)     Status: None (Preliminary result)   Collection Time: 02/28/18  9:42 AM  Result Value Ref Range Status   Specimen Description BLOOD BLOOD RIGHT HAND  Final   Special Requests   Final    BOTTLES DRAWN AEROBIC ONLY Blood Culture adequate volume   Culture   Final    NO GROWTH 3 DAYS Performed at [facility], [place] [address] [place],  [address]    Report Status PENDING  Incomplete    RADIOLOGY STUDIES/RESULTS: Ct Abdomen Pelvis Wo Contrast  Result Date: 02/23/2018 CLINICAL DATA:  Abdominal pain after dialysis.  Nausea and vomiting. EXAM: CT ABDOMEN AND PELVIS WITHOUT CONTRAST TECHNIQUE: Multidetector CT imaging of the abdomen and pelvis was performed following the standard protocol without IV contrast. COMPARISON:  08/07/2016 FINDINGS: Lower chest: Dependent atelectasis bilaterally. Hepatobiliary: 4.5 x 5.2 cm heterogeneous low-density mass is identified in the inferior liver, at the junction of the right and left hepatic lobes. This is right at the gallbladder fossa and contracted gallbladder with known calcified  gallstones is seen immediately adjacent. Liver otherwise unremarkable on this noncontrast exam. No intrahepatic or extrahepatic biliary dilation. Pancreas: No focal  mass lesion. No dilatation of the main duct. No intraparenchymal cyst. No peripancreatic edema. Spleen: No splenomegaly. No focal mass lesion. Adrenals/Urinary Tract: No adrenal nodule or mass. Right kidney unremarkable. Adjacent nonobstructing 7 mm stones are identified in the interpolar left kidney. Ureters unremarkable. Bladder is decompressed. Stomach/Bowel: Stomach is nondistended. No gastric wall thickening. No evidence of outlet obstruction. Duodenum is normally positioned as is the ligament of Treitz. No small bowel wall thickening. No small bowel dilatation. The appendix is best seen on coronal and sagittal imaging and has normal features. There is edema/inflammation around the cecum with cecal wall thickening evident (59/2). Right-sided diverticuli are noted in the cecum and ascending colon. Diverticular changes are noted in the left colon without evidence of diverticulitis. Vascular/Lymphatic: There is abdominal aortic atherosclerosis without aneurysm. There is no gastrohepatic or hepatoduodenal ligament lymphadenopathy. No intraperitoneal or retroperitoneal lymphadenopathy. No pelvic sidewall lymphadenopathy. Reproductive: The uterus has normal CT imaging appearance. There is no adnexal mass. Other: No intraperitoneal free fluid. Musculoskeletal: No worrisome lytic or sclerotic osseous abnormality. IMPRESSION: 1. Wall thickening in the cecum with pericecal edema/inflammation on a background of right-sided diverticulosis. Imaging features could be related to right-sided diverticulitis. Infectious/inflammatory right colitis also a consideration and right-sided neoplasm cannot be entirely excluded based on this CT scan. 2. Normal terminal ileum and appendix. 3. 4.5 x 5.2 cm heterogeneous low-density lesion in the inferior liver is new since  08/07/2016. This is immediately contiguous with the gallbladder fossa. Given changes in the right colon, liver abscess would be a consideration. Primary/secondary liver neoplasm could also have this appearance and proximity to the contracted gallbladder raises the question of gallbladder origin. If renal function permits, follow-up CT or MRI with contrast would likely prove helpful to further evaluate. 4.  Aortic Atherosclerois (ICD10-170.0) Electronically Signed   By: Misty Stanley M.D.   On: 02/23/2018 15:26   Nm Gi Blood Loss  Addendum Date: 02/24/2018   ADDENDUM REPORT: 02/24/2018 19:15 ADDENDUM: Study discussed by telephone with Dr. Silverio Decamp on 02/24/2018 at 1904 hours. Electronically Signed   By: Genevie Ann M.D.   On: 02/24/2018 19:15   Result Date: 02/24/2018 CLINICAL DATA:  72 year old female with right upper quadrant pain nausea and vomiting. Onset of dark bloody stools today. EXAM: NUCLEAR MEDICINE GASTROINTESTINAL BLEEDING SCAN TECHNIQUE: Sequential abdominal images were obtained following intravenous administration of Tc-55m labeled red blood cells. RADIOPHARMACEUTICALS:  26.1 mCi Tc-60m pertechnetate in-vitro labeled red cells. COMPARISON:  CT Abdomen and Pelvis 03/05/2018 and earlier. FINDINGS: Physiologic blood pool activity demonstrated throughout the 1st hour of imaging. Toward the end of the 1st hour there is questionable faint left abdominal small bowel type activity (image 53). During the 2nd hour there is definite abnormal radiotracer activity in the left mid abdomen which appears to move through multiple closely looped segments of bowel in that region. Comparison of the large and small bowel anatomy on the coronal CT images yesterday suggests small-bowel position. IMPRESSION: Positive GI bleeding is detected during the 2nd hour of the study in the left abdomen. The morphology favors a small-bowel bleeding source. Electronically Signed: By: Genevie Ann M.D. On: 02/24/2018 18:40   Ct Abdomen  Pelvis W Contrast  Result Date: 02/23/2018 CLINICAL DATA:  72 year old female with history of abdominal pain and nausea and vomiting since yesterday. EXAM: CT ABDOMEN AND PELVIS WITH CONTRAST TECHNIQUE: Multidetector CT imaging of the abdomen and pelvis was performed using the standard protocol following bolus administration of intravenous contrast. CONTRAST:  121mL OMNIPAQUE IOHEXOL 300  MG/ML  SOLN COMPARISON:  Noncontrast CT the abdomen and pelvis 02/23/2018. FINDINGS: Lower chest: Dependent areas of scarring and/or atelectasis in the lower lobes of the lungs bilaterally. Hepatobiliary: As noted on the prior study there is a large intermediate attenuation lesion in the central aspect of the liver adjacent to the gallbladder fossa. This is irregular in shape and therefore difficult to accurately measure, but this measures at least 5.2 x 4.4 cm (axial image 32 of series 3) predominantly involving segments 4B and 5. This lesion appears to have some thick internal septations. There is a smaller satellite lesion in the central aspect of segment 5 measuring 1.8 cm (axial image 34 of series 3). In addition, inferior to the lesion there are some dilated bile ducts, likely related to mass effect from the lesion centrally causing mild obstruction. In the posterior aspect of segment 6 of the liver (axial image 33 of series 3) there is an additional 11 mm lesion which is intermediate attenuation. Calcified gallstones in the gallbladder which appears nearly completely contracted. Pancreas: No pancreatic mass. No pancreatic ductal dilatation. No pancreatic or peripancreatic fluid or inflammatory changes. Spleen: Unremarkable. Adrenals/Urinary Tract: 2 nonobstructive calculi are noted in the collecting system of left kidney measuring up to 8 mm. Right kidney and bilateral adrenal glands are normal in appearance. No hydroureteronephrosis. Urinary bladder is normal in appearance. Stomach/Bowel: Normal appearance of the stomach.  No pathologic dilatation of small bowel or colon. Numerous colonic diverticulae are noted, most evident in the descending colon. Mural thickening in the region of the cecum, concerning for focal area of colitis, or potentially right-sided diverticulitis. The possibility of underlying mass is not excluded. Appendix is not confidently identified on today's examination. Vascular/Lymphatic: Aortic atherosclerosis, without evidence of aneurysm or dissection in the abdominal or pelvic vasculature. No lymphadenopathy noted in the abdomen or pelvis. Reproductive: Uterus and ovaries are unremarkable in appearance. Other: No significant volume of ascites.  No pneumoperitoneum. Musculoskeletal: There are no aggressive appearing lytic or blastic lesions noted in the visualized portions of the skeleton. IMPRESSION: 1. Large multi-cystic lesion in the central aspect of the liver adjacent to the gallbladder fossa, with several smaller satellite lesions. These are all new compared to prior CT the chest, abdomen and pelvis 08/07/2016, concerning for intrahepatic abscesses. 2. Extensive mural thickening and inflammatory changes in the region of the cecum. This is nonspecific, and could reflect either right-sided diverticulitis, focal area of colitis, or potentially even underlying neoplasm. 3. Cholelithiasis. Gallbladder is nearly completely contracted without surrounding inflammatory changes to suggest an acute cholecystitis at this time. 4. Left-sided nephrolithiasis measuring up to 8 mm in the upper pole collecting system of left kidney. No ureteral stones or findings of urinary tract obstruction. 5. Aortic atherosclerosis. Electronically Signed   By: Vinnie Langton M.D.   On: 02/23/2018 21:23   US Biopsy (liver)  Result Date: 02/27/2018 INDICATION: 72 year old with an indeterminate liver lesion. Concern for mass versus abscess. EXAM: ULTRASOUND-GUIDED LIVER LESION BIOPSY MEDICATIONS: None. ANESTHESIA/SEDATION: Moderate  (conscious) sedation was employed during this procedure. A total of Versed 0.5 mg and Fentanyl 25 mcg was administered intravenously. Moderate Sedation Time: 10 minutes. The patient's level of consciousness and vital signs were monitored continuously by radiology nursing throughout the procedure under my direct supervision. FLUOROSCOPY TIME:  None COMPLICATIONS: None immediate. PROCEDURE: Informed written consent was obtained from the patient after a thorough discussion of the procedural risks, benefits and alternatives. All questions were addressed. A timeout was performed prior to the  initiation of the procedure. Liver was evaluated with ultrasound. Heterogeneous lesion in the right hepatic lobe adjacent to the gallbladder was identified. The right abdomen was prepped and draped in sterile fashion. Maximal barrier sterile technique was utilized including caps, mask, sterile gowns, sterile gloves, sterile drape, hand hygiene and skin antiseptic. Overlying skin was anesthetized with 1% lidocaine. 17 gauge coaxial needle was directed into this heterogeneous lesion with ultrasound guidance. No fluid could be aspirated from the 17 gauge coaxial needle. Three core biopsies were obtained with an 18 gauge core device. Solid core specimens were obtained and placed in formalin. 17 gauge needle was removed without complication. Bandage placed over the puncture site. FINDINGS: Heterogeneous solid lesion in the right hepatic lobe. No fluid could be aspirated from this lesion. Biopsy needle confirmed within the lesion. IMPRESSION: Ultrasound-guided core biopsies of the right hepatic mass. Electronically Signed   By: Markus Daft M.D.   On: 02/27/2018 15:22   Ir Abdomen US Limited  Result Date: 02/26/2018 CLINICAL DATA:  72 year old with an indeterminate liver lesion. EXAM: ULTRASOUND ABDOMEN LIMITED COMPARISON:  CT 02/25/2018 FINDINGS: Liver was evaluated with ultrasound. At the area of concern, there is a heterogeneous  hypoechoic structure which appears solid rather than cystic. This lesion is adjacent to calcifications associated with a contracted and possibly abnormal gallbladder. Liver lesion is more suggestive for a solid lesion rather cystic collection or abscess. IMPRESSION: Dominant liver lesion is concerning for a neoplastic process. Plan for ultrasound-guided biopsy of this lesion. Electronically Signed   By: Markus Daft M.D.   On: 02/26/2018 21:48   Ct Angio Abd/pel W/ And/or W/o  Result Date: 02/25/2018 CLINICAL DATA:  Active GI bleeding. EXAM: CTA ABDOMEN AND PELVIS wITHOUT AND WITH CONTRAST TECHNIQUE: Multidetector CT imaging of the abdomen and pelvis was performed using the standard protocol during bolus administration of intravenous contrast. Multiplanar reconstructed images and MIPs were obtained and reviewed to evaluate the vascular anatomy. CONTRAST:  144mL ISOVUE-370 IOPAMIDOL (ISOVUE-370) INJECTION 76% COMPARISON:  CT, 02/23/2018. FINDINGS: VASCULAR Aorta: Normal in caliber. There is atherosclerosis throughout the aorta with no significant stenosis. Celiac: Minor plaque at the origin.  No significant stenosis. SMA: Mild plaque at the origin.  No significant stenosis. Renals: Mild plaque just beyond the origin on the left. Minimal plaque just beyond the origin on the right. No significant stenosis. IMA: Patent without evidence of aneurysm, dissection, vasculitis or significant stenosis. Inflow: Atherosclerotic changes along the common iliac and internal iliac arteries. No significant stenosis. Mild dilation of the right common iliac artery to 14 mm. Proximal Outflow: Mild plaque involving both common femoral arteries without significant stenosis. Veins: Patent. Review of the MIP images confirms the above findings. NON-VASCULAR Lower chest: Lower lobe dependent atelectasis. No convincing pneumonia. No pulmonary edema. Heart is mildly enlarged. Hepatobiliary: Heterogeneous cystic appearing mass in the right  lobe is unchanged from the study performed 2 days ago. There are 2 smaller adjacent masses that lie posterior and inferior to the dominant right lobe mass. No other liver lesions. There is mild intrahepatic bile duct dilation in the upper right lobe, segment 7. Gallbladder is mostly collapsed. There are gallstones. No bile duct dilation. Pancreas: Unremarkable. No pancreatic ductal dilatation or surrounding inflammatory changes. Spleen: Normal in size without focal abnormality. Adrenals/Urinary Tract: No adrenal masses. Kidneys are normal in size, orientation and position with symmetric enhancement. There are 2 adjacent nonobstructing stones in the left kidney at the midpole. No renal masses. No hydronephrosis. Normal ureters. Bladder is  mostly collapsed. It contains contrast enhanced urine. Stomach/Bowel: As noted on the prior CT, there is a cecal mass versus eccentric wall thickening. Hazy inflammation is now noted adjacent to this. There are several diverticula. Findings support right colon diverticulitis. No abscess. No extraluminal air. There is no evidence of arterial extravasation of contrast into the right colon, or elsewhere in the bowel, to indicate an active bleeding source. Right colon is mildly distended. No other areas of wall thickening. No other evidence of a mass. There are additional colonic diverticula with no other inflammatory changes. Stomach and small bowel unremarkable. Normal appendix visualized. Lymphatic: Mildly prominent gastrohepatic ligament lymph nodes, none pathologically enlarged. Reproductive: Uterus and bilateral adnexa are unremarkable. Other: No abdominal wall hernia or abnormality. No abdominopelvic ascites. Musculoskeletal: No fracture or acute finding. No osteoblastic or osteolytic lesions. IMPRESSION: VASCULAR 1. Atherosclerotic changes as described without significant stenosis. 2. No evidence of an active arterial bleeding source in the bowel. NON-VASCULAR 1. Findings are  similar to the prior CT. There is eccentric wall thickening versus a possible mass in the cecum. There are associated diverticula and adjacent inflammation. Findings are most likely due to diverticulitis. No extraluminal air or abscess. There is no visualized arterial bleeding, although this is likely the source of GI bleeding. 2. Right liver lobe masses as described on the prior CT. Differential diagnosis includes liver abscesses. 3. No other acute abnormality in the abdomen and pelvis. 4. There are other colonic diverticula.  No other diverticulitis. 5. Left intrarenal stones.  Gallstones. 6. Aortic atherosclerosis. Electronically Signed   By: Lajean Manes M.D.   On: 02/25/2018 18:43     LOS: 8 days   Oren Binet, MD  [facility]  If 7PM-7AM, please contact night-coverage  Please page [email or web address] on MD name and type text message  03/03/2018, 1:41 PM

## 2018-03-04 ENCOUNTER — Inpatient Hospital Stay (HOSPITAL_COMMUNITY): Payer: Medicare Other

## 2018-03-04 DIAGNOSIS — K6389 Other specified diseases of intestine: Secondary | ICD-10-CM

## 2018-03-04 MED ORDER — PEG-KCL-NACL-NASULF-NA ASC-C 100 G PO SOLR
0.5000 | Freq: Once | ORAL | Status: AC
  Administered 2018-03-05: 100 g via ORAL
  Filled 2018-03-04: qty 1

## 2018-03-04 MED ORDER — IOHEXOL 300 MG/ML  SOLN
100.0000 mL | Freq: Once | INTRAMUSCULAR | Status: AC | PRN
  Administered 2018-03-04: 100 mL via INTRAVENOUS

## 2018-03-04 MED ORDER — CHLORHEXIDINE GLUCONATE CLOTH 2 % EX PADS
6.0000 | MEDICATED_PAD | Freq: Every day | CUTANEOUS | Status: DC
  Administered 2018-03-04: 6 via TOPICAL

## 2018-03-04 MED ORDER — PEG-KCL-NACL-NASULF-NA ASC-C 100 G PO SOLR: 1.0000 | Freq: Once | ORAL | Status: DC

## 2018-03-04 MED ORDER — PEG-KCL-NACL-NASULF-NA ASC-C 100 G PO SOLR
0.5000 | Freq: Once | ORAL | Status: AC
  Administered 2018-03-04: 100 g via ORAL
  Filled 2018-03-04: qty 1

## 2018-03-04 MED ORDER — METOCLOPRAMIDE HCL 5 MG/ML IJ SOLN
10.0000 mg | Freq: Once | INTRAMUSCULAR | Status: AC
  Administered 2018-03-05: 10 mg via INTRAVENOUS
  Filled 2018-03-04: qty 2

## 2018-03-04 MED ORDER — METOCLOPRAMIDE HCL 5 MG/ML IJ SOLN
10.0000 mg | Freq: Once | INTRAMUSCULAR | Status: AC
  Administered 2018-03-04: 10 mg via INTRAVENOUS
  Filled 2018-03-04: qty 2

## 2018-03-04 NOTE — Care Management Important Message (Signed)
Important Message  Patient Details  Name: Mary Chapman MRN: [id number] Date of Birth: Dec 17, 1945   Medicare Important Message Given:  Yes    Orbie Pyo 03/04/2018, 10:19 AM

## 2018-03-04 NOTE — Progress Notes (Signed)
PROGRESS NOTE        PATIENT DETAILS Name: Mary Chapman Age: 72 y.o. Sex: female Date of Birth: September 13, 1945 Admit Date: 02/23/2018 Admitting Physician Karmen Bongo, MD TDV:[facility], [facility]  Brief Narrative: Patient is a 72 y.o. female with history of ESRD on HD presented with abdominal pain-further work-up revealed possible malignancy versus infection in the cecum area.  CT scan also showed numerous masses in the liver-these were initially felt to be cystic in nature, however upon further evaluation by IR-these were thought to be solid and more consistent with malignancy.  Subsequently underwent ultrasound-guided biopsy on 10/12.  General surgery, GI continue to follow.  Subjective: No chest pain or shortness of breath.  No hematochezia  Assessment/Plan: Cecal infection versus malignancy along with liver lesions: Initially there were concerns of right-sided diverticulitis and possible liver abscesses-however upon further evaluation by IR-these evaluations were felt to be solid, underwent ultrasound-guided biopsy on 10/12, preliminary results (discussed with pathology) suggestive of upper GI source, however 2-3% of colorectal cancers could have similar stains.  GI and general surgery following-suspect probably requires endoscopic evaluation for direct visualization of the sacrum as the next step.  Since low suspicion for diverticulitis at this point-we will go ahead and stop Zosyn.  Continue with liquid diet-until further evaluation by GI today.  Melena with acute blood loss anemia: No further melena, bleeding scan on 10/9 showed possible source in the small bowel-did require 2 units of PRBC.  Hemoglobin currently stable.  GI following-see above.  ESRD: On HD TTS-nephrology following and directing care.    Hypertension: Controlled-continue metoprolol.   Tobacco use: Counseled  DVT Prophylaxis: SCD's  Code Status: Full code   Family  Communication: None at bedside  Disposition Plan: Remain inpatient  Antimicrobial agents: Anti-infectives (From admission, onward)   Start     Dose/Rate Route Frequency Ordered Stop   03/01/18 1200  vancomycin (VANCOCIN) IVPB 750 mg/150 ml premix  Status:  Discontinued     750 mg 150 mL/hr over 60 Minutes Intravenous Every M-W-F (Hemodialysis) 02/28/18 0913 03/03/18 1401   02/28/18 0930  vancomycin (VANCOCIN) 1,750 mg in sodium chloride 0.9 % 500 mL IVPB     1,750 mg 250 mL/hr over 120 Minutes Intravenous  Once 02/28/18 0913 02/28/18 1323   02/23/18 1319  piperacillin-tazobactam (ZOSYN) IVPB 3.375 g  Status:  Discontinued     3.375 g 12.5 mL/hr over 240 Minutes Intravenous Every 12 hours 02/23/18 1319 03/04/18 1104      Procedures: None  CONSULTS:  GI, nephrology and general surgery  Time spent: 25 minutes-Greater than 50% of this time was spent in counseling, explanation of diagnosis, planning of further management, and coordination of care.  MEDICATIONS: Scheduled Meds: . Chlorhexidine Gluconate Cloth  6 each Topical Q0600  . mouth rinse  15 mL Mouth Rinse BID  . metoprolol tartrate  25 mg Oral BID  . multivitamin  1 tablet Oral QHS  . nicotine  14 mg Transdermal Q2000  . pantoprazole  40 mg Oral BID   Continuous Infusions: . sodium chloride Stopped (02/28/18 1635)  . dextrose 40 mL/hr at 03/01/18 0400  . ferric gluconate (FERRLECIT/NULECIT) IV 62.5 mg (03/03/18 0942)   PRN Meds:.sodium chloride, acetaminophen **OR** acetaminophen, calcium carbonate (dosed in mg elemental calcium), camphor-menthol **AND** hydrOXYzine, docusate sodium, feeding supplement (NEPRO CARB STEADY), HYDROmorphone (DILAUDID) injection, labetalol, sorbitol, zolpidem  PHYSICAL EXAM: Vital signs: Vitals:   03/03/18 1319 03/03/18 1431 03/03/18 2140 03/04/18 0508  BP: (!) 148/87 125/60 127/60 (!) 141/79  Pulse:  75 84 77  Resp:  18  17  Temp:  97.9 F (36.6 C)  98.7 F (37.1 C)  TempSrc:     Oral  SpO2:  95%  97%  Weight:      Height:       Filed Weights   03/01/18 1432 03/01/18 1908 03/03/18 0807  Weight: 69.9 kg 67 kg 67.9 kg   Body mass index is 24.16 kg/m.   General appearance:Awake, alert, not in any distress.  Eyes:no scleral icterus. HEENT: Atraumatic and Normocephalic Neck: supple, no JVD. Resp:Good air entry bilaterally,no rales or rhonchi CVS: S1 S2 regular, no murmurs.  GI: Bowel sounds present, mildly tender in the right lower quadrant-no peritoneal signs.  Extremities: B/L Lower Ext shows no edema, both legs are warm to touch Neurology:  Non focal Psychiatric: Normal judgment and insight. Normal mood. Musculoskeletal:No digital cyanosis Skin:No Rash, warm and dry Wounds:N/A I have personally reviewed following labs and imaging studies  LABORATORY DATA: CBC: Recent Labs  Lab 02/27/18 1636 02/28/18 0435 03/01/18 0445 03/02/18 0452 03/03/18 0422  WBC 11.5* 16.2* 18.1* 16.7* 14.6*  HGB 10.4* 10.5* 8.9* 9.7* 9.6*  HCT 31.4* 32.4* 28.0* 30.9* 30.3*  MCV 93.5 94.7 94.3 95.1 96.2  PLT 146* 175 236 337 176    Basic Metabolic Panel: Recent Labs  Lab 02/26/18 1000 02/27/18 0605 03/01/18 1513 03/02/18 0452  NA 138 134* 131* 135  K 4.2 3.3* 2.8* 3.7  CL 104 97* 96* 98  CO2 23 26 24 25   GLUCOSE 105* 107* 107* 93  BUN 43* 12 33* 15  CREATININE 7.68* 4.15* 8.28* 5.04*  CALCIUM 7.0* 7.3* 7.3* 7.5*  MG  --   --   --  2.0  PHOS 4.7*  --   --   --     GFR: Estimated Creatinine Clearance: 9.6 mL/min (A) (by C-G formula based on SCr of 5.04 mg/dL (H)).  Liver Function Tests: Recent Labs  Lab 02/26/18 1000  ALBUMIN 1.7*   No results for input(s): LIPASE, AMYLASE in the last 168 hours. No results for input(s): AMMONIA in the last 168 hours.  Coagulation Profile: Recent Labs  Lab 02/26/18 1907  INR 1.17    Cardiac Enzymes: No results for input(s): CKTOTAL, CKMB, CKMBINDEX, TROPONINI in the last 168 hours.  BNP (last 3 results) No  results for input(s): PROBNP in the last 8760 hours.  HbA1C: No results for input(s): HGBA1C in the last 72 hours.  CBG: Recent Labs  Lab 03/03/18 0401 03/03/18 0444 03/03/18 0740 03/03/18 0856 03/03/18 1324  GLUCAP 65* 75 81 96 76    Lipid Profile: No results for input(s): CHOL, HDL, LDLCALC, TRIG, CHOLHDL, LDLDIRECT in the last 72 hours.  Thyroid Function Tests: No results for input(s): TSH, T4TOTAL, FREET4, T3FREE, THYROIDAB in the last 72 hours.  Anemia Panel: No results for input(s): VITAMINB12, FOLATE, FERRITIN, TIBC, IRON, RETICCTPCT in the last 72 hours.  Urine analysis:    Component Value Date/Time   COLORURINE YELLOW 03/14/2017 2330   APPEARANCEUR CLOUDY (A) 03/14/2017 2330   LABSPEC 1.020 03/14/2017 2330   PHURINE 6.0 03/14/2017 2330   GLUCOSEU 100 (A) 03/14/2017 2330   HGBUR MODERATE (A) 03/14/2017 2330   BILIRUBINUR NEGATIVE 03/14/2017 2330   KETONESUR NEGATIVE 03/14/2017 2330   PROTEINUR >300 (A) 03/14/2017 2330   NITRITE NEGATIVE 03/14/2017 2330  LEUKOCYTESUR NEGATIVE 03/14/2017 2330    Sepsis Labs: Lactic Acid, Venous    Component Value Date/Time   LATICACIDVEN 1.76 02/23/2018 1415    MICROBIOLOGY: Recent Results (from the past 240 hour(s))  MRSA PCR Screening     Status: None   Collection Time: 02/24/18 12:19 AM  Result Value Ref Range Status   MRSA by PCR NEGATIVE NEGATIVE Final    Comment:        The GeneXpert MRSA Assay (FDA approved for NASAL specimens only), is one component of a comprehensive MRSA colonization surveillance program. It is not intended to diagnose MRSA infection nor to guide or monitor treatment for MRSA infections. Performed at [facility], [place] [address], [place], [place] [address]   Culture, blood (routine x 2)     Status: None   Collection Time: 02/24/18 10:15 AM  Result Value Ref Range Status   Specimen Description BLOOD RIGHT HAND  Final   Special Requests   Final    BOTTLES DRAWN AEROBIC ONLY  Blood Culture adequate volume   Culture   Final    NO GROWTH 6 DAYS Performed at [facility], [place] [address] [place], [place] [address]    Report Status 03/02/2018 FINAL  Final  Culture, blood (routine x 2)     Status: None   Collection Time: 02/24/18 10:18 AM  Result Value Ref Range Status   Specimen Description BLOOD RIGHT HAND  Final   Special Requests   Final    BOTTLES DRAWN AEROBIC ONLY Blood Culture adequate volume   Culture   Final    NO GROWTH 6 DAYS Performed at [facility], [place] [address], [place], [place] [address]    Report Status 03/02/2018 FINAL  Final  Culture, blood (routine x 2)     Status: None (Preliminary result)   Collection Time: 02/28/18  9:33 AM  Result Value Ref Range Status   Specimen Description BLOOD BLOOD LEFT HAND  Final   Special Requests   Final    BOTTLES DRAWN AEROBIC ONLY Blood Culture results may not be optimal due to an inadequate volume of blood received in culture bottles   Culture   Final    NO GROWTH 4 DAYS Performed at [facility], [place] [address] [place], [place] [address]    Report Status PENDING  Incomplete  Culture, blood (routine x 2)     Status: None (Preliminary result)   Collection Time: 02/28/18  9:42 AM  Result Value Ref Range Status   Specimen Description BLOOD BLOOD RIGHT HAND  Final   Special Requests   Final    BOTTLES DRAWN AEROBIC ONLY Blood Culture adequate volume   Culture   Final    NO GROWTH 4 DAYS Performed at [facility], [place] [address], [place], [place] [address]    Report Status PENDING  Incomplete    RADIOLOGY STUDIES/RESULTS: Ct Abdomen Pelvis Wo Contrast  Result Date: 02/23/2018 CLINICAL DATA:  Abdominal pain after dialysis.  Nausea and vomiting. EXAM: CT ABDOMEN AND PELVIS WITHOUT CONTRAST TECHNIQUE: Multidetector CT imaging of the abdomen and pelvis was performed following the standard protocol without IV contrast. COMPARISON:  08/07/2016 FINDINGS: Lower chest: Dependent  atelectasis bilaterally. Hepatobiliary: 4.5 x 5.2 cm heterogeneous low-density mass is identified in the inferior liver, at the junction of the right and left hepatic lobes. This is right at the gallbladder fossa and contracted gallbladder with known calcified gallstones is seen immediately adjacent. Liver  otherwise unremarkable on this noncontrast exam. No intrahepatic or extrahepatic biliary dilation. Pancreas: No focal mass lesion. No dilatation of the main duct. No intraparenchymal cyst. No peripancreatic edema. Spleen: No splenomegaly. No focal mass lesion. Adrenals/Urinary Tract: No adrenal nodule or mass. Right kidney unremarkable. Adjacent nonobstructing 7 mm stones are identified in the interpolar left kidney. Ureters unremarkable. Bladder is decompressed. Stomach/Bowel: Stomach is nondistended. No gastric wall thickening. No evidence of outlet obstruction. Duodenum is normally positioned as is the ligament of Treitz. No small bowel wall thickening. No small bowel dilatation. The appendix is best seen on coronal and sagittal imaging and has normal features. There is edema/inflammation around the cecum with cecal wall thickening evident (59/2). Right-sided diverticuli are noted in the cecum and ascending colon. Diverticular changes are noted in the left colon without evidence of diverticulitis. Vascular/Lymphatic: There is abdominal aortic atherosclerosis without aneurysm. There is no gastrohepatic or hepatoduodenal ligament lymphadenopathy. No intraperitoneal or retroperitoneal lymphadenopathy. No pelvic sidewall lymphadenopathy. Reproductive: The uterus has normal CT imaging appearance. There is no adnexal mass. Other: No intraperitoneal free fluid. Musculoskeletal: No worrisome lytic or sclerotic osseous abnormality. IMPRESSION: 1. Wall thickening in the cecum with pericecal edema/inflammation on a background of right-sided diverticulosis. Imaging features could be related to right-sided diverticulitis.  Infectious/inflammatory right colitis also a consideration and right-sided neoplasm cannot be entirely excluded based on this CT scan. 2. Normal terminal ileum and appendix. 3. 4.5 x 5.2 cm heterogeneous low-density lesion in the inferior liver is new since 08/07/2016. This is immediately contiguous with the gallbladder fossa. Given changes in the right colon, liver abscess would be a consideration. Primary/secondary liver neoplasm could also have this appearance and proximity to the contracted gallbladder raises the question of gallbladder origin. If renal function permits, follow-up CT or MRI with contrast would likely prove helpful to further evaluate. 4.  Aortic Atherosclerois (ICD10-170.0) Electronically Signed   By: Misty Stanley M.D.   On: 02/23/2018 15:26   Nm Gi Blood Loss  Addendum Date: 02/24/2018   ADDENDUM REPORT: 02/24/2018 19:15 ADDENDUM: Study discussed by telephone with Dr. Silverio Decamp on 02/24/2018 at 1904 hours. Electronically Signed   By: Genevie Ann M.D.   On: 02/24/2018 19:15   Result Date: 02/24/2018 CLINICAL DATA:  72 year old female with right upper quadrant pain nausea and vomiting. Onset of dark bloody stools today. EXAM: NUCLEAR MEDICINE GASTROINTESTINAL BLEEDING SCAN TECHNIQUE: Sequential abdominal images were obtained following intravenous administration of Tc-66m labeled red blood cells. RADIOPHARMACEUTICALS:  26.1 mCi Tc-3m pertechnetate in-vitro labeled red cells. COMPARISON:  CT Abdomen and Pelvis 03/05/2018 and earlier. FINDINGS: Physiologic blood pool activity demonstrated throughout the 1st hour of imaging. Toward the end of the 1st hour there is questionable faint left abdominal small bowel type activity (image 53). During the 2nd hour there is definite abnormal radiotracer activity in the left mid abdomen which appears to move through multiple closely looped segments of bowel in that region. Comparison of the large and small bowel anatomy on the coronal CT images yesterday  suggests small-bowel position. IMPRESSION: Positive GI bleeding is detected during the 2nd hour of the study in the left abdomen. The morphology favors a small-bowel bleeding source. Electronically Signed: By: Genevie Ann M.D. On: 02/24/2018 18:40   Ct Abdomen Pelvis W Contrast  Result Date: 02/23/2018 CLINICAL DATA:  72 year old female with history of abdominal pain and nausea and vomiting since yesterday. EXAM: CT ABDOMEN AND PELVIS WITH CONTRAST TECHNIQUE: Multidetector CT imaging of the abdomen and pelvis was performed using  the standard protocol following bolus administration of intravenous contrast. CONTRAST:  124mL OMNIPAQUE IOHEXOL 300 MG/ML  SOLN COMPARISON:  Noncontrast CT the abdomen and pelvis 02/23/2018. FINDINGS: Lower chest: Dependent areas of scarring and/or atelectasis in the lower lobes of the lungs bilaterally. Hepatobiliary: As noted on the prior study there is a large intermediate attenuation lesion in the central aspect of the liver adjacent to the gallbladder fossa. This is irregular in shape and therefore difficult to accurately measure, but this measures at least 5.2 x 4.4 cm (axial image 32 of series 3) predominantly involving segments 4B and 5. This lesion appears to have some thick internal septations. There is a smaller satellite lesion in the central aspect of segment 5 measuring 1.8 cm (axial image 34 of series 3). In addition, inferior to the lesion there are some dilated bile ducts, likely related to mass effect from the lesion centrally causing mild obstruction. In the posterior aspect of segment 6 of the liver (axial image 33 of series 3) there is an additional 11 mm lesion which is intermediate attenuation. Calcified gallstones in the gallbladder which appears nearly completely contracted. Pancreas: No pancreatic mass. No pancreatic ductal dilatation. No pancreatic or peripancreatic fluid or inflammatory changes. Spleen: Unremarkable. Adrenals/Urinary Tract: 2 nonobstructive  calculi are noted in the collecting system of left kidney measuring up to 8 mm. Right kidney and bilateral adrenal glands are normal in appearance. No hydroureteronephrosis. Urinary bladder is normal in appearance. Stomach/Bowel: Normal appearance of the stomach. No pathologic dilatation of small bowel or colon. Numerous colonic diverticulae are noted, most evident in the descending colon. Mural thickening in the region of the cecum, concerning for focal area of colitis, or potentially right-sided diverticulitis. The possibility of underlying mass is not excluded. Appendix is not confidently identified on today's examination. Vascular/Lymphatic: Aortic atherosclerosis, without evidence of aneurysm or dissection in the abdominal or pelvic vasculature. No lymphadenopathy noted in the abdomen or pelvis. Reproductive: Uterus and ovaries are unremarkable in appearance. Other: No significant volume of ascites.  No pneumoperitoneum. Musculoskeletal: There are no aggressive appearing lytic or blastic lesions noted in the visualized portions of the skeleton. IMPRESSION: 1. Large multi-cystic lesion in the central aspect of the liver adjacent to the gallbladder fossa, with several smaller satellite lesions. These are all new compared to prior CT the chest, abdomen and pelvis 08/07/2016, concerning for intrahepatic abscesses. 2. Extensive mural thickening and inflammatory changes in the region of the cecum. This is nonspecific, and could reflect either right-sided diverticulitis, focal area of colitis, or potentially even underlying neoplasm. 3. Cholelithiasis. Gallbladder is nearly completely contracted without surrounding inflammatory changes to suggest an acute cholecystitis at this time. 4. Left-sided nephrolithiasis measuring up to 8 mm in the upper pole collecting system of left kidney. No ureteral stones or findings of urinary tract obstruction. 5. Aortic atherosclerosis. Electronically Signed   By: Vinnie Langton  M.D.   On: 02/23/2018 21:23   US Biopsy (liver)  Result Date: 02/27/2018 INDICATION: 72 year old with an indeterminate liver lesion. Concern for mass versus abscess. EXAM: ULTRASOUND-GUIDED LIVER LESION BIOPSY MEDICATIONS: None. ANESTHESIA/SEDATION: Moderate (conscious) sedation was employed during this procedure. A total of Versed 0.5 mg and Fentanyl 25 mcg was administered intravenously. Moderate Sedation Time: 10 minutes. The patient's level of consciousness and vital signs were monitored continuously by radiology nursing throughout the procedure under my direct supervision. FLUOROSCOPY TIME:  None COMPLICATIONS: None immediate. PROCEDURE: Informed written consent was obtained from the patient after a thorough discussion of the procedural  risks, benefits and alternatives. All questions were addressed. A timeout was performed prior to the initiation of the procedure. Liver was evaluated with ultrasound. Heterogeneous lesion in the right hepatic lobe adjacent to the gallbladder was identified. The right abdomen was prepped and draped in sterile fashion. Maximal barrier sterile technique was utilized including caps, mask, sterile gowns, sterile gloves, sterile drape, hand hygiene and skin antiseptic. Overlying skin was anesthetized with 1% lidocaine. 17 gauge coaxial needle was directed into this heterogeneous lesion with ultrasound guidance. No fluid could be aspirated from the 17 gauge coaxial needle. Three core biopsies were obtained with an 18 gauge core device. Solid core specimens were obtained and placed in formalin. 17 gauge needle was removed without complication. Bandage placed over the puncture site. FINDINGS: Heterogeneous solid lesion in the right hepatic lobe. No fluid could be aspirated from this lesion. Biopsy needle confirmed within the lesion. IMPRESSION: Ultrasound-guided core biopsies of the right hepatic mass. Electronically Signed   By: Markus Daft M.D.   On: 02/27/2018 15:22   Ir Abdomen  US Limited  Result Date: 02/26/2018 CLINICAL DATA:  71 year old with an indeterminate liver lesion. EXAM: ULTRASOUND ABDOMEN LIMITED COMPARISON:  CT 02/25/2018 FINDINGS: Liver was evaluated with ultrasound. At the area of concern, there is a heterogeneous hypoechoic structure which appears solid rather than cystic. This lesion is adjacent to calcifications associated with a contracted and possibly abnormal gallbladder. Liver lesion is more suggestive for a solid lesion rather cystic collection or abscess. IMPRESSION: Dominant liver lesion is concerning for a neoplastic process. Plan for ultrasound-guided biopsy of this lesion. Electronically Signed   By: Markus Daft M.D.   On: 02/26/2018 21:48   Ct Angio Abd/pel W/ And/or W/o  Result Date: 02/25/2018 CLINICAL DATA:  Active GI bleeding. EXAM: CTA ABDOMEN AND PELVIS wITHOUT AND WITH CONTRAST TECHNIQUE: Multidetector CT imaging of the abdomen and pelvis was performed using the standard protocol during bolus administration of intravenous contrast. Multiplanar reconstructed images and MIPs were obtained and reviewed to evaluate the vascular anatomy. CONTRAST:  155mL ISOVUE-370 IOPAMIDOL (ISOVUE-370) INJECTION 76% COMPARISON:  CT, 02/23/2018. FINDINGS: VASCULAR Aorta: Normal in caliber. There is atherosclerosis throughout the aorta with no significant stenosis. Celiac: Minor plaque at the origin.  No significant stenosis. SMA: Mild plaque at the origin.  No significant stenosis. Renals: Mild plaque just beyond the origin on the left. Minimal plaque just beyond the origin on the right. No significant stenosis. IMA: Patent without evidence of aneurysm, dissection, vasculitis or significant stenosis. Inflow: Atherosclerotic changes along the common iliac and internal iliac arteries. No significant stenosis. Mild dilation of the right common iliac artery to 14 mm. Proximal Outflow: Mild plaque involving both common femoral arteries without significant stenosis. Veins:  Patent. Review of the MIP images confirms the above findings. NON-VASCULAR Lower chest: Lower lobe dependent atelectasis. No convincing pneumonia. No pulmonary edema. Heart is mildly enlarged. Hepatobiliary: Heterogeneous cystic appearing mass in the right lobe is unchanged from the study performed 2 days ago. There are 2 smaller adjacent masses that lie posterior and inferior to the dominant right lobe mass. No other liver lesions. There is mild intrahepatic bile duct dilation in the upper right lobe, segment 7. Gallbladder is mostly collapsed. There are gallstones. No bile duct dilation. Pancreas: Unremarkable. No pancreatic ductal dilatation or surrounding inflammatory changes. Spleen: Normal in size without focal abnormality. Adrenals/Urinary Tract: No adrenal masses. Kidneys are normal in size, orientation and position with symmetric enhancement. There are 2 adjacent nonobstructing stones in  the left kidney at the midpole. No renal masses. No hydronephrosis. Normal ureters. Bladder is mostly collapsed. It contains contrast enhanced urine. Stomach/Bowel: As noted on the prior CT, there is a cecal mass versus eccentric wall thickening. Hazy inflammation is now noted adjacent to this. There are several diverticula. Findings support right colon diverticulitis. No abscess. No extraluminal air. There is no evidence of arterial extravasation of contrast into the right colon, or elsewhere in the bowel, to indicate an active bleeding source. Right colon is mildly distended. No other areas of wall thickening. No other evidence of a mass. There are additional colonic diverticula with no other inflammatory changes. Stomach and small bowel unremarkable. Normal appendix visualized. Lymphatic: Mildly prominent gastrohepatic ligament lymph nodes, none pathologically enlarged. Reproductive: Uterus and bilateral adnexa are unremarkable. Other: No abdominal wall hernia or abnormality. No abdominopelvic ascites. Musculoskeletal:  No fracture or acute finding. No osteoblastic or osteolytic lesions. IMPRESSION: VASCULAR 1. Atherosclerotic changes as described without significant stenosis. 2. No evidence of an active arterial bleeding source in the bowel. NON-VASCULAR 1. Findings are similar to the prior CT. There is eccentric wall thickening versus a possible mass in the cecum. There are associated diverticula and adjacent inflammation. Findings are most likely due to diverticulitis. No extraluminal air or abscess. There is no visualized arterial bleeding, although this is likely the source of GI bleeding. 2. Right liver lobe masses as described on the prior CT. Differential diagnosis includes liver abscesses. 3. No other acute abnormality in the abdomen and pelvis. 4. There are other colonic diverticula.  No other diverticulitis. 5. Left intrarenal stones.  Gallstones. 6. Aortic atherosclerosis. Electronically Signed   By: Lajean Manes M.D.   On: 02/25/2018 18:43     LOS: 9 days   Oren Binet, MD  [facility]  If 7PM-7AM, please contact night-coverage  Please page via [email or web address] on MD name and type text message  03/04/2018, 11:04 AM

## 2018-03-04 NOTE — Anesthesia Preprocedure Evaluation (Addendum)
Anesthesia Evaluation  Patient identified by MRN, date of birth, ID band Patient awake    Reviewed: Allergy & Precautions, NPO status , Patient's Chart, lab work & pertinent test results  History of Anesthesia Complications Negative for: history of anesthetic complications  Airway Mallampati: II  TM Distance: >3 FB Neck ROM: Full    Dental   Partial- removed:   Pulmonary neg pulmonary ROS, Current Smoker,    Pulmonary exam normal        Cardiovascular hypertension, Normal cardiovascular exam     Neuro/Psych negative neurological ROS  negative psych ROS   GI/Hepatic negative GI ROS, Liver masses, possibly mets   Endo/Other  negative endocrine ROS  Renal/GU ESRF and DialysisRenal diseaseLast dialysis Wednesday. Due for dialysis today. K 3.7.  negative genitourinary   Musculoskeletal negative musculoskeletal ROS (+)   Abdominal   Peds  Hematology negative hematology ROS (+) anemia ,   Anesthesia Other Findings   Reproductive/Obstetrics                            Anesthesia Physical Anesthesia Plan  ASA: IV  Anesthesia Plan: MAC   Post-op Pain Management:    Induction:   PONV Risk Score and Plan: 1 and Propofol infusion  Airway Management Planned: Natural Airway, Nasal Cannula and Simple Face Mask  Additional Equipment: None  Intra-op Plan:   Post-operative Plan:   Informed Consent: I have reviewed the patients History and Physical, chart, labs and discussed the procedure including the risks, benefits and alternatives for the proposed anesthesia with the patient or authorized representative who has indicated his/her understanding and acceptance.     Plan Discussed with:   Anesthesia Plan Comments:        Anesthesia Quick Evaluation

## 2018-03-04 NOTE — Progress Notes (Signed)
D/w Dr Sloan Leiter.  He was able to reach Dr Verlene Mayer with pathology.  Liver mass consistent with adenocarcinoma.  Favor foregut etiology, although there is a 2% chance of it being colorectal.  Dr. Rosendo Gros wonders if this could be gallbladder related and looking at the CT scan, the mass seems pretty close to the gallbladder that would explain the foregut likelihood etiology of the tumor.   Regardless, I think further endoscopic work-up would be of benefit.  D/w Azucena Freed with GI.  Colonoscopy to rule out cecal cancer.  Upper endoscopy to rule out for any etiology.  Will defer need for MRCP/ERCP with brush biopsies or EUS to rule out hepatobiliary/GB etiology.  Small bowel endoscopy to rule out small bowel tumor given the fact there was some bleeding on bleeding scan.?  Meckel's.  Not all coming together necessarily.  I am more skeptical that the patient truly had any infectious etiology.  Patient could have isolated cecal diverticulitis/colitis.  Regardless, patient feels better.    [facility] agrees and is holding on Zosyn after the 7-day course.  Dr Rosendo Gros & I discussed with patient.  She is not toxic.  Adin Hector, MD, FACS, MASCRS Gastrointestinal and Minimally Invasive Surgery    [address] [place], [place] [address] [phone] Main / Paging [phone] Fax

## 2018-03-04 NOTE — Progress Notes (Addendum)
Daily Rounding Note  03/04/2018, 9:58 AM  LOS: 9 days   SUBJECTIVE:   Chief complaint: GI bleeding, stools have been brown for several days. Right abdominal pain  OBJECTIVE:         Vital signs in last 24 hours:    Temp:  [97.9 F (36.6 C)-98.7 F (37.1 C)] 98.7 F (37.1 C) (10/17 0508) Pulse Rate:  [71-84] 77 (10/17 0508) Resp:  [17-18] 17 (10/17 0508) BP: (125-157)/(60-92) 141/79 (10/17 0508) SpO2:  [95 %-97 %] 97 % (10/17 0508) Last BM Date: 03/02/18 Filed Weights   03/01/18 1432 03/01/18 1908 03/03/18 0807  Weight: 69.9 kg 67 kg 67.9 kg   General: Looks a bit frail but she is alert and comfortable. Heart: RRR. Chest: Clear bilaterally.  No labored breathing.  Voice husky consistent with a smoker. Abdomen: Soft.  Minimal tenderness in the left mid and lower abdomen.  No guarding or rebound.  Active bowel sounds.  No distention. Extremities: CCE. Neuro/Psych: Pleasant, cooperative, calm.  No gross deficits, tremors.  Fully alert and oriented.  Asking appropriate questions.  Intake/Output from previous day: 10/16 0701 - 10/17 0700 In: 200 [P.O.:200] Out: -   Intake/Output this shift: No intake/output data recorded.  Lab Results: Recent Labs    03/02/18 0452 03/03/18 0422  WBC 16.7* 14.6*  HGB 9.7* 9.6*  HCT 30.9* 30.3*  PLT 337 400   BMET Recent Labs    03/01/18 1513 03/02/18 0452  NA 131* 135  K 2.8* 3.7  CL 96* 98  CO2 24 25  GLUCOSE 107* 93  BUN 33* 15  CREATININE 8.28* 5.04*  CALCIUM 7.3* 7.5*     ASSESMENT:   *Acute GI bleed. Dark emesis. IV ProtonixBID >> Protonix po BID. Nuclear medicine bleeding scan 10/9:Bleeding at second hour in the left abdomen, favoring small bowel source. CTA 10/10:Eccentric cecal wall thickening, possibly a mass with associated diverticula and adjacent inflammation. Findings most likely due to diverticulitis. No visualized bleeding although  the cecal findings are most likely source of bleeding. Liver masses in the right lobe,differential includes abscesses. Other diverticula within the colon. Gallstones and left intra-renal stones. Aortic atherosclerosis. Enteroscopy canceled 10/10due to acute altered mental status, tachycardia.    *Acute blood loss anemia. 2 units PRBCs10/11.Hgb 7 >> 8 >> 10.5.    *Cystic liver lesions, concerning for abscesses vs neoplasm.   Minor elevation AST, O/w normal LFTs.   Ultrasound 10/11.Dominant liver lesion concerning for a neoplasm. Biopsy liver mass 10/12: solid, not able to aspirate fluid.    Official report not yet published but per verbal discussion with the pathologist today this is metastatic carcinoma.  The stains are more consistent with an upper GI tumor: Duodenal, gastric, esophageal, pancreatico biliary all possible sources.  Less likely lower GI source. Dr. Rosendo Gros, surgeon, reviewed the CT scan and notes that the liver mass is very close to the gallbladder thus unable to exclude gallbladder as source for neoplasm.  *Cecal diverticulitis vs focal colitis, unable to rule out underlying neoplasia.Previous colonoscopy, unknown date, at outside facilities reported as unremarkable by pt.  Zosyn and vancomycin to be discontinued today.  WBCs remain elevated but blood cultures negative at 4 days.   *    Normocytic anemia. Hgb stable improved.  Nuelicit given in dialysis.    *ESRD. Hemodialysis MWF.   PLAN   *   Await official published pathology report.  *   Dr. Johney Maine is the surgeon  on the case today.  He suggests endoscopic work-up: Colonoscopy to rule out cecal cancer, EGD/enteroscopy to rule out upper GI tract abnormalities, and assess the positive bleeding scan/possible small bowel lesion.   Will d/w Dr Hilarie Fredrickson.   *    Repeat the CT abdomen pelvis to reassess the cecum and determine if the appearance is improved enough to allow for colonoscopy.  *   Pt updated on latest findings and plans, appreciative of the explanation.      Azucena Freed  03/04/2018, 9:58 AM Phone [phone]

## 2018-03-04 NOTE — H&P (View-Only) (Signed)
Daily Rounding Note  03/04/2018, 9:58 AM  LOS: 9 days   SUBJECTIVE:   Chief complaint: GI bleeding, stools have been brown for several days. Right abdominal pain  OBJECTIVE:         Vital signs in last 24 hours:    Temp:  [97.9 F (36.6 C)-98.7 F (37.1 C)] 98.7 F (37.1 C) (10/17 0508) Pulse Rate:  [71-84] 77 (10/17 0508) Resp:  [17-18] 17 (10/17 0508) BP: (125-157)/(60-92) 141/79 (10/17 0508) SpO2:  [95 %-97 %] 97 % (10/17 0508) Last BM Date: 03/02/18 Filed Weights   03/01/18 1432 03/01/18 1908 03/03/18 0807  Weight: 69.9 kg 67 kg 67.9 kg   General: Looks a bit frail but she is alert and comfortable. Heart: RRR. Chest: Clear bilaterally.  No labored breathing.  Voice husky consistent with a smoker. Abdomen: Soft.  Minimal tenderness in the left mid and lower abdomen.  No guarding or rebound.  Active bowel sounds.  No distention. Extremities: CCE. Neuro/Psych: Pleasant, cooperative, calm.  No gross deficits, tremors.  Fully alert and oriented.  Asking appropriate questions.  Intake/Output from previous day: 10/16 0701 - 10/17 0700 In: 200 [P.O.:200] Out: -   Intake/Output this shift: No intake/output data recorded.  Lab Results: Recent Labs    03/02/18 0452 03/03/18 0422  WBC 16.7* 14.6*  HGB 9.7* 9.6*  HCT 30.9* 30.3*  PLT 337 400   BMET Recent Labs    03/01/18 1513 03/02/18 0452  NA 131* 135  K 2.8* 3.7  CL 96* 98  CO2 24 25  GLUCOSE 107* 93  BUN 33* 15  CREATININE 8.28* 5.04*  CALCIUM 7.3* 7.5*     ASSESMENT:   *Acute GI bleed. Dark emesis. IV ProtonixBID >> Protonix po BID. Nuclear medicine bleeding scan 10/9:Bleeding at second hour in the left abdomen, favoring small bowel source. CTA 10/10:Eccentric cecal wall thickening, possibly a mass with associated diverticula and adjacent inflammation. Findings most likely due to diverticulitis. No visualized bleeding although  the cecal findings are most likely source of bleeding. Liver masses in the right lobe,differential includes abscesses. Other diverticula within the colon. Gallstones and left intra-renal stones. Aortic atherosclerosis. Enteroscopy canceled 10/10due to acute altered mental status, tachycardia.    *Acute blood loss anemia. 2 units PRBCs10/11.Hgb 7 >> 8 >> 10.5.    *Cystic liver lesions, concerning for abscesses vs neoplasm.   Minor elevation AST, O/w normal LFTs.   Ultrasound 10/11.Dominant liver lesion concerning for a neoplasm. Biopsy liver mass 10/12: solid, not able to aspirate fluid.    Official report not yet published but per verbal discussion with the pathologist today this is metastatic carcinoma.  The stains are more consistent with an upper GI tumor: Duodenal, gastric, esophageal, pancreatico biliary all possible sources.  Less likely lower GI source. Dr. Rosendo Gros, surgeon, reviewed the CT scan and notes that the liver mass is very close to the gallbladder thus unable to exclude gallbladder as source for neoplasm.  *Cecal diverticulitis vs focal colitis, unable to rule out underlying neoplasia.Previous colonoscopy, unknown date, at outside facilities reported as unremarkable by pt.  Zosyn and vancomycin to be discontinued today.  WBCs remain elevated but blood cultures negative at 4 days.   *    Normocytic anemia. Hgb stable improved.  Nuelicit given in dialysis.    *ESRD. Hemodialysis MWF.   PLAN   *   Await official published pathology report.  *   Dr. Johney Maine is the surgeon  on the case today.  He suggests endoscopic work-up: Colonoscopy to rule out cecal cancer, EGD/enteroscopy to rule out upper GI tract abnormalities, and assess the positive bleeding scan/possible small bowel lesion.   Will d/w Dr Hilarie Fredrickson.   *    Repeat the CT abdomen pelvis to reassess the cecum and determine if the appearance is improved enough to allow for colonoscopy.  *   Pt updated on latest findings and plans, appreciative of the explanation.      Azucena Freed  03/04/2018, 9:58 AM Phone [phone]

## 2018-03-04 NOTE — Progress Notes (Signed)
7 Days Post-Op    CC: abdominal pain  Subjective: Pain continues to improve and she still having some pain in the right lower quadrant.  She is asking for a pain pill now.  Markedly improved since admission.  Objective: Vital signs in last 24 hours: Temp:  [97.9 F (36.6 C)-98.7 F (37.1 C)] 98.7 F (37.1 C) (10/17 0508) Pulse Rate:  [71-84] 77 (10/17 0508) Resp:  [17-18] 17 (10/17 0508) BP: (125-157)/(60-92) 141/79 (10/17 0508) SpO2:  [95 %-97 %] 97 % (10/17 0508) Last BM Date: 03/02/18 200 PO Afebrile, VSS No labs Pathology still pending   Intake/Output from previous day: 10/16 0701 - 10/17 0700 In: 200 [P.O.:200] Out: -  Intake/Output this shift: No intake/output data recorded.  General appearance: alert, cooperative and no distress Resp: clear to auscultation bilaterally GI: Soft, some ongoing discomfort in the right lower quadrant, markedly improved since admission positive bowel sounds  Lab Results:  Recent Labs    03/02/18 0452 03/03/18 0422  WBC 16.7* 14.6*  HGB 9.7* 9.6*  HCT 30.9* 30.3*  PLT 337 400    BMET Recent Labs    03/01/18 1513 03/02/18 0452  NA 131* 135  K 2.8* 3.7  CL 96* 98  CO2 24 25  GLUCOSE 107* 93  BUN 33* 15  CREATININE 8.28* 5.04*  CALCIUM 7.3* 7.5*   PT/INR No results for input(s): LABPROT, INR in the last 72 hours.  Recent Labs  Lab 02/26/18 1000  ALBUMIN 1.7*     Lipase     Component Value Date/Time   LIPASE 35 02/23/2018 1224     Medications: . Chlorhexidine Gluconate Cloth  6 each Topical Q0600  . mouth rinse  15 mL Mouth Rinse BID  . metoprolol tartrate  25 mg Oral BID  . multivitamin  1 tablet Oral QHS  . nicotine  14 mg Transdermal Q2000  . pantoprazole  40 mg Oral BID   Anti-infectives (From admission, onward)   Start     Dose/Rate Route Frequency Ordered Stop   03/01/18 1200  vancomycin (VANCOCIN) IVPB 750 mg/150 ml premix  Status:  Discontinued     750 mg 150 mL/hr over 60 Minutes Intravenous  Every M-W-F (Hemodialysis) 02/28/18 0913 03/03/18 1401   02/28/18 0930  vancomycin (VANCOCIN) 1,750 mg in sodium chloride 0.9 % 500 mL IVPB     1,750 mg 250 mL/hr over 120 Minutes Intravenous  Once 02/28/18 0913 02/28/18 1323   02/23/18 1319  piperacillin-tazobactam (ZOSYN) IVPB 3.375 g     3.375 g 12.5 mL/hr over 240 Minutes Intravenous Every 12 hours 02/23/18 1319        Assessment/Plan  End-stage renal disease on dialysisMWF Hypertension Ongoing tobacco use Hyperlipidemia  Right lower quadrant pain GI bleed Possible diverticulitisvs colitis vs neoplasm of the cecum Liver mass -GI following-based on pathology of liver bx, may require colonoscopy prior to surgery to fully eval colon prior to surgery. -CTA negative10/9for a sourceof bleed - tagged RBC scan shows bleeding in L abdomen, likely SB source -s/pIR biopsy of liver lesion10/12, path pending - Patient clinically about the same withsporadicRLQ tenderness, no peritonitis. WBC cont' to rise. Leukocytosis could be elevated due to procedure.  ID -zosyn 10/8>> FEN -IVF,full liquids VTE -SCDs, no chemical DVT prophylaxis due to bleeding Foley -none  Plan: Day 10 Zosyn, awaiting pathology report.     LOS: 9 days    Mary Chapman 03/04/2018 [phone]

## 2018-03-05 ENCOUNTER — Encounter (HOSPITAL_COMMUNITY)
Admission: EM | Disposition: A | Discharge status: Home / Self Care | Payer: Self-pay | Source: Home / Self Care | Attending: Internal Medicine

## 2018-03-05 ENCOUNTER — Encounter (HOSPITAL_COMMUNITY): Payer: Self-pay | Admitting: *Deleted

## 2018-03-05 ENCOUNTER — Inpatient Hospital Stay (HOSPITAL_COMMUNITY): Payer: Medicare Other | Admitting: Anesthesiology

## 2018-03-05 DIAGNOSIS — K2980 Duodenitis without bleeding: Secondary | ICD-10-CM

## 2018-03-05 DIAGNOSIS — K5730 Diverticulosis of large intestine without perforation or abscess without bleeding: Secondary | ICD-10-CM

## 2018-03-05 DIAGNOSIS — K228 Other specified diseases of esophagus: Secondary | ICD-10-CM

## 2018-03-05 DIAGNOSIS — K449 Diaphragmatic hernia without obstruction or gangrene: Secondary | ICD-10-CM

## 2018-03-05 DIAGNOSIS — K640 First degree hemorrhoids: Secondary | ICD-10-CM

## 2018-03-05 DIAGNOSIS — K2970 Gastritis, unspecified, without bleeding: Secondary | ICD-10-CM

## 2018-03-05 DIAGNOSIS — K644 Residual hemorrhoidal skin tags: Secondary | ICD-10-CM

## 2018-03-05 DIAGNOSIS — K3189 Other diseases of stomach and duodenum: Secondary | ICD-10-CM

## 2018-03-05 DIAGNOSIS — K633 Ulcer of intestine: Secondary | ICD-10-CM

## 2018-03-05 HISTORY — PX: COLONOSCOPY WITH PROPOFOL: SHX5780

## 2018-03-05 HISTORY — PX: ENTEROSCOPY: SHX5533

## 2018-03-05 HISTORY — PX: BIOPSY: SHX5522

## 2018-03-05 HISTORY — PX: SUBMUCOSAL INJECTION: SHX5543

## 2018-03-05 LAB — CBC
HEMATOCRIT: 31.6 % — AB (ref 36.0–46.0)
HEMOGLOBIN: 10 g/dL — AB (ref 12.0–15.0)
MCH: 30.3 pg (ref 26.0–34.0)
MCHC: 31.6 g/dL (ref 30.0–36.0)
MCV: 95.8 fL (ref 80.0–100.0)
Platelets: 584 10*3/uL — ABNORMAL HIGH (ref 150–400)
RBC: 3.3 MIL/uL — ABNORMAL LOW (ref 3.87–5.11)
RDW: 15.6 % — AB (ref 11.5–15.5)
WBC: 13.5 10*3/uL — AB (ref 4.0–10.5)
nRBC: 0 % (ref 0.0–0.2)

## 2018-03-05 LAB — CULTURE, BLOOD (ROUTINE X 2)
CULTURE: NO GROWTH
CULTURE: NO GROWTH
Special Requests: ADEQUATE

## 2018-03-05 LAB — RENAL FUNCTION PANEL
ALBUMIN: 2 g/dL — AB (ref 3.5–5.0)
ANION GAP: 13 (ref 5–15)
BUN: 12 mg/dL (ref 8–23)
CALCIUM: 8 mg/dL — AB (ref 8.9–10.3)
CO2: 23 mmol/L (ref 22–32)
CREATININE: 6.32 mg/dL — AB (ref 0.44–1.00)
Chloride: 97 mmol/L — ABNORMAL LOW (ref 98–111)
GFR calc Af Amer: 7 mL/min — ABNORMAL LOW (ref >60)
GFR, EST NON AFRICAN AMERICAN: 6 mL/min — AB (ref >60)
Glucose, Bld: 78 mg/dL (ref 70–99)
PHOSPHORUS: 4 mg/dL (ref 2.5–4.6)
Potassium: 3.7 mmol/L (ref 3.5–5.1)
SODIUM: 133 mmol/L — AB (ref 135–145)

## 2018-03-05 LAB — HEPATIC FUNCTION PANEL
ALT: 21 U/L (ref 0–44)
AST: 25 U/L (ref 15–41)
Albumin: 2 g/dL — ABNORMAL LOW (ref 3.5–5.0)
Alkaline Phosphatase: 112 U/L (ref 38–126)
BILIRUBIN TOTAL: 0.8 mg/dL (ref 0.3–1.2)
Bilirubin, Direct: <0.1 mg/dL (ref 0.0–0.2)
TOTAL PROTEIN: 6.4 g/dL — AB (ref 6.5–8.1)

## 2018-03-05 SURGERY — ENTEROSCOPY
Anesthesia: Monitor Anesthesia Care

## 2018-03-05 MED ORDER — PENTAFLUOROPROP-TETRAFLUOROETH EX AERO: 1.0000 "application " | INHALATION_SPRAY | CUTANEOUS | Status: DC | PRN

## 2018-03-05 MED ORDER — SODIUM CHLORIDE 0.9 % IV SOLN: 100.0000 mL | INTRAVENOUS | Status: DC | PRN

## 2018-03-05 MED ORDER — PROPOFOL 10 MG/ML IV BOLUS
INTRAVENOUS | Status: DC | PRN
  Administered 2018-03-05 (×5): 15 mg via INTRAVENOUS

## 2018-03-05 MED ORDER — SPOT INK MARKER SYRINGE KIT
PACK | SUBMUCOSAL | Status: DC | PRN
  Administered 2018-03-05: 1.5 mL via SUBMUCOSAL
  Administered 2018-03-05: 4 mL via SUBMUCOSAL

## 2018-03-05 MED ORDER — ALTEPLASE 2 MG IJ SOLR: 2.0000 mg | Freq: Once | INTRAMUSCULAR | Status: DC | PRN

## 2018-03-05 MED ORDER — HEPARIN SODIUM (PORCINE) 1000 UNIT/ML DIALYSIS: 1000.0000 [IU] | INTRAMUSCULAR | Status: DC | PRN

## 2018-03-05 MED ORDER — LIDOCAINE 2% (20 MG/ML) 5 ML SYRINGE
INTRAMUSCULAR | Status: DC | PRN
  Administered 2018-03-05: 60 mg via INTRAVENOUS

## 2018-03-05 MED ORDER — PROPOFOL 500 MG/50ML IV EMUL
INTRAVENOUS | Status: DC | PRN
  Administered 2018-03-05: 80 ug/kg/min via INTRAVENOUS
  Administered 2018-03-05: 08:00:00 via INTRAVENOUS

## 2018-03-05 MED ORDER — SODIUM CHLORIDE 0.9 % IV SOLN: INTRAVENOUS | Status: DC

## 2018-03-05 MED ORDER — BUTAMBEN-TETRACAINE-BENZOCAINE 2-2-14 % EX AERO
INHALATION_SPRAY | CUTANEOUS | Status: DC | PRN
  Administered 2018-03-05: 1 via TOPICAL

## 2018-03-05 MED ORDER — ONDANSETRON HCL 4 MG/2ML IJ SOLN
INTRAMUSCULAR | Status: DC | PRN
  Administered 2018-03-05: 4 mg via INTRAVENOUS

## 2018-03-05 MED ORDER — SPOT INK MARKER SYRINGE KIT
PACK | SUBMUCOSAL | Status: AC
  Filled 2018-03-05: qty 5

## 2018-03-05 MED ORDER — SODIUM CHLORIDE 0.9 % IV SOLN
INTRAVENOUS | Status: DC | PRN
  Administered 2018-03-05: 07:00:00 via INTRAVENOUS

## 2018-03-05 MED ORDER — LIDOCAINE HCL (PF) 1 % IJ SOLN: 5.0000 mL | INTRAMUSCULAR | Status: DC | PRN

## 2018-03-05 MED ORDER — LIDOCAINE-PRILOCAINE 2.5-2.5 % EX CREA: 1.0000 "application " | TOPICAL_CREAM | CUTANEOUS | Status: DC | PRN

## 2018-03-05 SURGICAL SUPPLY — 22 items

## 2018-03-05 NOTE — Procedures (Signed)
   I was present at this dialysis session, have reviewed the session itself and made  appropriate changes [name] MD [facility] pager [phone]   03/05/2018, 5:04 PM

## 2018-03-05 NOTE — Op Note (Signed)
[facility] Patient Name: Mary Chapman Procedure Date : 03/05/2018 MRN: [id number] Attending MD: Justice Britain , MD Date of Birth: [birth year] CSN: [id number] Age: 72 Admit Type: Inpatient Procedure:                Small bowel enteroscopy Indications:              Unexplained abdominal distress/pain in the right                            upper quadrant, Unexplained abdominal distress/pain                            in the right lower quadrant, Abnormal abdominal CT,                            Obscure gastrointestinal bleeding, Tumor of the GI                            tract Providers:                Justice Britain, MD, Cleda Daub, RN,                            William Dalton, Technician Referring MD:              Medicines:                Monitored Anesthesia Care Complications:            No immediate complications. Estimated Blood Loss:     Estimated blood loss was minimal. Procedure:                Pre-Anesthesia Assessment:                           - Prior to the procedure, a History and Physical                            was performed, and patient medications and                            allergies were reviewed. The patient's tolerance of                            previous anesthesia was also reviewed. The risks                            and benefits of the procedure and the sedation                            options and risks were discussed with the patient.                            All questions were answered, and informed consent                            was obtained. Prior  Anticoagulants: The patient has                            taken no previous anticoagulant or antiplatelet                            agents. ASA Grade Assessment: III - A patient with                            severe systemic disease. After reviewing the risks                            and benefits, the patient was deemed in   satisfactory condition to undergo the procedure.                           After obtaining informed consent, the endoscope was                            passed under direct vision. Throughout the                            procedure, the patient's blood pressure, pulse, and                            oxygen saturations were monitored continuously. The                            [id number]-[id number] ([id number]) peds colon was introduced                            through the mouth and advanced to the proximal                            jejunum. The small bowel enteroscopy was                            accomplished without difficulty. The patient                            tolerated the procedure. Scope In: Scope Out: Findings:      White nummular lesions were noted superficially on the entire esophagus.       Cells for cytology were obtained by brushing to rule out Candida.      The Z-line was regular and was found 39 cm from the incisors.      A small hiatal hernia was present.      Multiple dispersed, small non-bleeding erosions were found in the       gastric antrum. There were no stigmata of recent bleeding.      No other gross lesions were noted in the entire examined stomach. This       was biopsied with a cold forceps for histology and Helicobacter pylori       testing from the antrum/incisura/greater curve/lesser curve.      Localized moderate mucosal changes characterized by congestion,  granularity and altered texture were found in the duodenal bulb and in       the D1/D2 angle. Biopsies were taken with a cold forceps for histology.      There were no gross lesions in the second portion of the duodenum, in       the third portion of the duodenum and in the fourth portion of the       duodenum.      There were no gross lesions in the visualized proximal jejunum. Area was       tattooed with an injection of Spot (carbon black) to demarcate distal       extent of the SBE. Impression:                - White nummular lesions in esophageal mucosa.                            Cells for cytology obtained.                           - Z-line regular, 39 cm from the incisors.                           - Small hiatal hernia.                           - Non-bleeding erosive gastropathy. No other gross                            lesions in the stomach. Biopsied for HP and                            histology.                           - Mucosal changes in the duodenum. Biopsied for                            histology.                           - Normal second portion of the duodenum, third                            portion of the duodenum and fourth portion of the                            duodenum.                           - The examined portion of the proximal jejunum was                            normal. Tattooed distal extent. Recommendation:           - Proceed to scheduled colonoscopy.                           -  Await pathology results.                           - Continue PPI 1-2 times daily.                           - The findings and recommendations were discussed                            with the patient.                           - The findings and recommendations were discussed                            with the patient's family. Procedure Code(s):        --- Professional ---                           [id number], 85, Small intestinal endoscopy, enteroscopy                            beyond second portion of duodenum, not including                            ileum; with biopsy, single or multiple                           44799, Unlisted procedure, small intestine Diagnosis Code(s):        --- Professional ---                           K22.8, Other specified diseases of esophagus                           K44.9, Diaphragmatic hernia without obstruction or                            gangrene                           K31.89, Other diseases of stomach and duodenum                            R10.11, Right upper quadrant pain                           R10.31, Right lower quadrant pain                           K92.2, Gastrointestinal hemorrhage, unspecified                           D49.0, Neoplasm of unspecified behavior of                            digestive system  R93.3, Abnormal findings on diagnostic imaging of                            other parts of digestive tract CPT copyright 2018 [employer]. All rights reserved. The codes documented in this report are preliminary and upon coder review may  be revised to meet current compliance requirements. Justice Britain, MD 03/05/2018 8:53:18 AM Number of Addenda: 0

## 2018-03-05 NOTE — Progress Notes (Signed)
[facility] Progress Note  Day of Surgery  Subjective: CC: RLQ pain Patient states RLQ pain unchanged. Underwent endoscopy and colonoscopy this AM. No BM or flatus since last night. Denies nausea and tolerating CLD.   Objective: Vital signs in last 24 hours: Temp:  [98.2 F (36.8 C)-99.3 F (37.4 C)] 98.9 F (37.2 C) (10/18 0654) Pulse Rate:  [68-78] 74 (10/18 0930) Resp:  [14-20] 18 (10/18 0930) BP: (134-152)/(67-87) 146/71 (10/18 0930) SpO2:  [95 %-100 %] 97 % (10/18 0930) Last BM Date: 03/04/18  Intake/Output from previous day: 10/17 0701 - 10/18 0700 In: 300 [P.O.:300] Out: -  Intake/Output this shift: Total I/O In: 400 [I.V.:400] Out: 5 [Blood:5]  PE: Gen:  Alert, NAD, pleasant Card:  Regular rate and rhythm Pulm:  Normal effort, clear to auscultation bilaterally Abd: Soft, TTP in RLQ, no guarding or rebound tenderness, non-distended, bowel sounds present Skin: warm and dry, no rashes  Psych: A&Ox3   Lab Results:  Recent Labs    03/03/18 0422 03/05/18 0310  WBC 14.6* 13.5*  HGB 9.6* 10.0*  HCT 30.3* 31.6*  PLT 400 584*   BMET Recent Labs    03/05/18 0310  NA 133*  K 3.7  CL 97*  CO2 23  GLUCOSE 78  BUN 12  CREATININE 6.32*  CALCIUM 8.0*   PT/INR No results for input(s): LABPROT, INR in the last 72 hours. CMP     Component Value Date/Time   NA 133 (L) 03/05/2018 0310   K 3.7 03/05/2018 0310   CL 97 (L) 03/05/2018 0310   CO2 23 03/05/2018 0310   GLUCOSE 78 03/05/2018 0310   BUN 12 03/05/2018 0310   CREATININE 6.32 (H) 03/05/2018 0310   CALCIUM 8.0 (L) 03/05/2018 0310   PROT 6.4 (L) 03/05/2018 0310   ALBUMIN 2.0 (L) 03/05/2018 0310   ALBUMIN 2.0 (L) 03/05/2018 0310   AST 25 03/05/2018 0310   ALT 21 03/05/2018 0310   ALKPHOS 112 03/05/2018 0310   BILITOT 0.8 03/05/2018 0310   GFRNONAA 6 (L) 03/05/2018 0310   GFRAA 7 (L) 03/05/2018 0310   Lipase     Component Value Date/Time   LIPASE 35 02/23/2018 1224        Studies/Results: Ct Abdomen Pelvis W Contrast  Result Date: 03/04/2018 CLINICAL DATA:  Diverticulitis.  Possible colonic mass. EXAM: CT ABDOMEN AND PELVIS WITH CONTRAST TECHNIQUE: Multidetector CT imaging of the abdomen and pelvis was performed using the standard protocol following bolus administration of intravenous contrast. CONTRAST:  175mL OMNIPAQUE IOHEXOL 300 MG/ML  SOLN COMPARISON:  02/23/2018 FINDINGS: Lower Chest: Mild right basilar atelectasis is increased since previous study. Hepatobiliary: Several tiny gallstones are seen. The gallbladder is contracted. A complex multilocular cystic lesion is seen in the liver adjacent to the gallbladder fossa which measures 4.9 x 5.0 cm and has not significantly changed since previous study. A few other smaller low-attenuation lesions in the inferior right lobe are also stable. No new liver masses are identified. New mild dilatation of intrahepatic bile ducts is seen in the superior right hepatic lobe. Pancreas:  No mass or inflammatory changes. Spleen: Within normal limits in size and appearance. Adrenals/Urinary Tract: No masses identified. No evidence of hydronephrosis. Two adjacent sub-cm calculi are seen in the interpolar region of the left kidney. Stomach/Bowel: Increased colonic wall thickening is now seen involving the cecum, ascending, and transverse portions of the colon, consistent with colitis. There is no evidence of bowel obstruction. No evidence of pneumatosis or free intraperitoneal  air. Diverticulosis is seen involving the descending and proximal sigmoid colon, however there is no evidence of diverticulitis in these regions. Narrowing abnormal fluid collections identified. Vascular/Lymphatic: Mild adenopathy is seen in the porta hepatis cysts which is increased since previous study. This measures 2.7 x 2.3 cm and shows on image 27/3. Aortic atherosclerosis. No abdominal aortic aneurysm. Reproductive:  No mass or other significant  abnormality. Other:  None. Musculoskeletal:  No suspicious bone lesions identified. IMPRESSION: Increased colonic wall thickening throughout the cecum, ascending and transverse colon, consistent with colitis. Stable complex cystic lesions in right hepatic lobe, and mild intrahepatic biliary ductal dilatation in the superior right hepatic lobe. These findings are suspicious for hepatic abscesses, although differential diagnosis also includes liver metastases and cholangiocarcinoma. Mild porta hepatis lymphadenopathy, shows mild increase since previous study. Cholelithiasis, without evidence of acute cholecystitis. Electronically Signed   By: Earle Gell M.D.   On: 03/04/2018 20:14    Anti-infectives: Anti-infectives (From admission, onward)   Start     Dose/Rate Route Frequency Ordered Stop   03/01/18 1200  vancomycin (VANCOCIN) IVPB 750 mg/150 ml premix  Status:  Discontinued     750 mg 150 mL/hr over 60 Minutes Intravenous Every M-W-F (Hemodialysis) 02/28/18 0913 03/03/18 1401   02/28/18 0930  vancomycin (VANCOCIN) 1,750 mg in sodium chloride 0.9 % 500 mL IVPB     1,750 mg 250 mL/hr over 120 Minutes Intravenous  Once 02/28/18 0913 02/28/18 1323   02/23/18 1319  piperacillin-tazobactam (ZOSYN) IVPB 3.375 g  Status:  Discontinued     3.375 g 12.5 mL/hr over 240 Minutes Intravenous Every 12 hours 02/23/18 1319 03/04/18 1104       Assessment/Plan End-stage renal disease on dialysisMWF Hypertension Ongoing tobacco use Hyperlipidemia  Right lower quadrant pain GI bleed Possible diverticulitisvs colitis vs neoplasm of the cecum Liver mass  -CTA negative10/12for a sourceof bleed - tagged RBC scan shows bleeding in L abdomen, likely SB source -s/pIR biopsy of liver lesion10/12, path shows adenocarcinoma -GI following- s/p colonoscopy and endoscopy this AM, biopsies taken and path pending - colonoscopy showed severe diverticulosis but no diverticular bleeding and ulcerated lesion  in cecum concerning for malignancy - Patient clinically stable withsporadicRLQ tenderness, no peritonitis. WBC decreased this AM.   ID -zosyn 10/8>10/17 FEN -IVF,full liquids VTE -SCDs, no chemical DVT prophylaxis due to bleeding Foley -none  Plan: Liver biopsy shows adenocarcinoma. Pathology pending for biopsies taken today. RLQ pain stable.  LOS: 10 days    Brigid Re , [facility] 03/05/2018, 10:34 AM Pager: [phone] Consults: [phone] Mon-Fri 7:00 am-4:30 pm Sat-Sun 7:00 am-11:30 am

## 2018-03-05 NOTE — Anesthesia Postprocedure Evaluation (Signed)
Anesthesia Post Note  Patient: Mary Chapman  Procedure(s) Performed: ENTEROSCOPY (N/A ) COLONOSCOPY WITH PROPOFOL (N/A ) BIOPSY SUBMUCOSAL INJECTION     Patient location during evaluation: PACU Anesthesia Type: MAC Level of consciousness: awake and alert Pain management: pain level controlled Vital Signs Assessment: post-procedure vital signs reviewed and stable Respiratory status: spontaneous breathing, nonlabored ventilation and respiratory function stable Cardiovascular status: blood pressure returned to baseline and stable Postop Assessment: no apparent nausea or vomiting Anesthetic complications: no    Last Vitals:  Vitals:   03/05/18 0920 03/05/18 0930  BP: (!) 149/73 (!) 146/71  Pulse: 76 74  Resp: 17 18  Temp:    SpO2: 100% 97%    Last Pain:  Vitals:   03/05/18 0930  TempSrc:   PainSc: 0-No pain                 Lidia Collum

## 2018-03-05 NOTE — Interval H&P Note (Signed)
History and Physical Interval Note:  03/05/2018 7:14 AM  Mary Chapman  has presented today for surgery, with the diagnosis of metastatic malignancy, unknown primary, abnormal GI imaging  The various methods of treatment have been discussed with the patient and family. After consideration of risks, benefits and other options for treatment, the patient has consented to  Procedure(s): ENTEROSCOPY (N/A) COLONOSCOPY WITH PROPOFOL (N/A) as a surgical intervention .  The patient's history has been reviewed, patient examined, no change in status, stable for surgery.  I have reviewed the patient's chart and labs.  Questions were answered to the patient's satisfaction.   The risks and benefits of endoscopic evaluation were discussed with the patient; these include but are not limited to the risk of perforation, infection, bleeding, missed lesions, lack of diagnosis, severe illness requiring hospitalization, as well as anesthesia and sedation related illnesses.  The patient is agreeable to proceed.     [employer]

## 2018-03-05 NOTE — Interval H&P Note (Deleted)
History and Physical Interval Note:  03/05/2018 7:13 AM  Mary Chapman  has presented today for surgery, with the diagnosis of metastatic malignancy, unknown primary, abnormal GI imaging  The various methods of treatment have been discussed with the patient and family. After consideration of risks, benefits and other options for treatment, the patient has consented to  Procedure(s): ENTEROSCOPY (N/A) COLONOSCOPY WITH PROPOFOL (N/A) as a surgical intervention .  The patient's history has been reviewed, patient examined, no change in status, stable for surgery.  I have reviewed the patient's chart and labs.  Questions were answered to the patient's satisfaction.     [employer]

## 2018-03-05 NOTE — Op Note (Signed)
[facility] Patient Name: Mary Chapman Procedure Date : 03/05/2018 MRN: [id number] Attending MD: Justice Britain , MD Date of Birth: 1946/01/03 CSN: [id number] Age: 72 Admit Type: Inpatient Procedure:                Colonoscopy Indications:              Evaluation on imaging study of clinically                            significant abnormality, Exclusion of cancer of the                            cecum, Abnormal CT of the GI tract, Suspected                            diverticulitis, Exclusion of diverticulitis Providers:                Justice Britain, MD, Cleda Daub, RN,                            William Dalton, Technician Referring MD:             Lajuan Lines. Pyrtle, MD, Dr. Nigel Bridgeman ([facility]) Medicines:                Monitored Anesthesia Care Complications:            No immediate complications. Estimated Blood Loss:     Estimated blood loss was minimal. Procedure:                Pre-Anesthesia Assessment:                           - Prior to the procedure, a History and Physical                            was performed, and patient medications and                            allergies were reviewed. The patient's tolerance of                            previous anesthesia was also reviewed. The risks                            and benefits of the procedure and the sedation                            options and risks were discussed with the patient.                            All questions were answered, and informed consent                            was obtained. Prior Anticoagulants: The patient has  taken no previous anticoagulant or antiplatelet                            agents. ASA Grade Assessment: III - A patient with                            severe systemic disease. After reviewing the risks                            and benefits, the patient was deemed in                            satisfactory condition to undergo the  procedure.                           After obtaining informed consent, the colonoscope                            was passed under direct vision. Throughout the                            procedure, the patient's blood pressure, pulse, and                            oxygen saturations were monitored continuously. The                            [id number]-[id number] ([id number]) peds colon was introduced                            through the anus and advanced to the the cecum,                            identified by appendiceal orifice and ileocecal                            valve. The colonoscopy was somewhat difficult due                            to significant looping and a tortuous colon.                            Successful completion of the procedure was aided by                            changing the patient's position, using manual                            pressure, withdrawing and reinserting the scope,                            straightening and shortening the scope to obtain  bowel loop reduction and using scope torsion. The                            quality of the bowel preparation was evaluated                            using the BBPS [name] Bowel Preparation Scale)                            with scores of: Right Colon = 2 (minor amount of                            residual staining, small fragments of stool and/or                            opaque liquid, but mucosa seen well), Transverse                            Colon = 3 (entire mucosa seen well with no residual                            staining, small fragments of stool or opaque                            liquid) and Left Colon = 2 (minor amount of                            residual staining, small fragments of stool and/or                            opaque liquid, but mucosa seen well). The total                            BBPS score equals 7. The quality of the bowel                             preparation was fair. Scope In: 8:01:26 AM Scope Out: 8:37:56 AM Scope Withdrawal Time: 0 hours 21 minutes 51 seconds  Total Procedure Duration: 0 hours 36 minutes 30 seconds  Findings:      The digital rectal exam findings include non-thrombosed external       hemorrhoids. Pertinent negatives include no palpable rectal lesions.      The colon (entire examined portion) revealed grossly excessive looping.      Many small and large-mouthed diverticula were found in the recto-sigmoid       colon, sigmoid colon, descending colon, transverse colon, hepatic       flexure, ascending colon and cecum. There was no evidence of       diverticular bleeding.      A single (solitary) twenty-five mm ulcer was found in the cecum with       associated friable and granular tissue surrounding this region (high       concerning for an underlying malignancy). No bleeding was present.  Biopsies were taken with a cold forceps for histology to rule out       malignancy.      A segmental and patchy area of moderately erythematous mucosa was found       in the transverse colon, at the hepatic flexure, in the ascending colon       and in the cecum. This was biopsied with a cold forceps for histology to       understand acute and chronic inflammation pattern.      An area at the hepatic flexure was tattooed with an injection of Spot       (carbon black) after a very large region of diverticulosis in the       ascending colon (for marking purposes in future should patient be a       surgical candidate).      Normal mucosa was found in the rectum, in the recto-sigmoid colon, in       the sigmoid colon and in the descending colon. Biopsies were taken with       a cold forceps for histology to rule out acute/chronic inflammation       pattern.      Non-bleeding non-thrombosed external and internal hemorrhoids were found       during retroflexion, during perianal exam and during digital exam. The        hemorrhoids were Grade I (internal hemorrhoids that do not prolapse). Impression:               - Preparation of the colon was fair.                           - Non-thrombosed external hemorrhoids found on                            digital rectal exam.                           - There was significant looping of the colon.                           - Severe diverticulosis in the recto-sigmoid colon,                            in the sigmoid colon, in the descending colon, in                            the transverse colon, at the hepatic flexure, in                            the ascending colon and in the cecum. There was no                            evidence of diverticular bleeding.                           - A single (solitary) ulcer in the cecum - highly  concerning for underlying malignancy. Biopsied.                            Phlegmonous change is possible, though often in                            setting of complicated                            diverticulosis/diverticulitis ulceration would not                            be as common.                           - Erythematous mucosa in the transverse colon, at                            the hepatic flexure, in the ascending colon and in                            the cecum. Biopsied.                           - Normal mucosa in the rectum, in the recto-sigmoid                            colon, in the sigmoid colon and in the descending                            colon. Biopsied.                           - Non-bleeding non-thrombosed external and internal                            hemorrhoids. Recommendation:           - The patient will be observed post-procedure,                            until all discharge criteria are met.                           - Return patient to hospital ward for ongoing care.                           - Await pathology results.                           - Advance diet  as tolerated.                           - The findings and recommendations were discussed                            with the  patient.                           - The findings and recommendations were discussed                            with the patient's family.                           - The findings and recommendations were discussed                            with the referring physician. Procedure Code(s):        --- Professional ---                           [phone], 72, Colonoscopy, flexible; with biopsy,                            single or multiple                           45381, Colonoscopy, flexible; with directed                            submucosal injection(s), any substance Diagnosis Code(s):        --- Professional ---                           K64.0, First degree hemorrhoids                           K64.4, Residual hemorrhoidal skin tags                           K63.3, Ulcer of intestine                           K63.89, Other specified diseases of intestine                           R93.3, Abnormal findings on diagnostic imaging of                            other parts of digestive tract                           K57.30, Diverticulosis of large intestine without                            perforation or abscess without bleeding CPT copyright 2018 [employer]. All rights reserved. The codes documented in this report are preliminary and upon coder review may  be revised to meet current compliance requirements. Justice Britain, MD 03/05/2018 9:04:47 AM Number of Addenda: 0

## 2018-03-05 NOTE — Progress Notes (Signed)
PROGRESS NOTE        PATIENT DETAILS Name: Mary Chapman Age: 72 y.o. Sex: female Date of Birth: 12-10-1945 Admit Date: 02/23/2018 Admitting Physician Karmen Bongo, MD RWE:[id number], [facility]  Brief Narrative: Patient is a 72 y.o. female with history of ESRD on HD presented with abdominal pain-further work-up revealed possible malignancy versus infection in the cecum area.  CT scan also showed numerous masses in the liver-these were initially felt to be cystic in nature, however upon further evaluation by IR-these were thought to be solid and more consistent with malignancy.  Subsequently underwent ultrasound-guided biopsy on 10/12.  General surgery, GI continue to follow.  Subjective: Comfortably bed-continues to have some pain in the right lower quadrant.  Assessment/Plan: Cecal infection versus malignancy along with liver lesions: Initially there were concerns of right-sided diverticulitis and possible metastatic liver abscess-however upon further evaluation it is now felt that she probably has a cecal malignancy with metastases lesions.  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  GI following-EGD with enteroscopy on 10/18 did not show any obvious malignancy, however a colonoscopy showed a ulcerated lesion in the cecum that per GI is highly concerning for malignancy.  Biopsy is currently pending.  No longer on antimicrobial therapy as currently not felt to have infection.  Will continue to follow once further biopsy results are obtained, will need oncology evaluation-have spoke with Onc on call to see if we can start arranging outpatient follow up soon.  Melena with acute blood loss anemia: No further melena-bleeding scan on 10/9 showed possible source in the small bowel-did require 2 units of PRBC.  Hemoglobin stable-EGD/colonoscopy negative for any bleeding foci.  Continue to follow CBC periodically.     ESRD: On HD MWF-nephrology following and directing care.    Hypertension: BP controlled-continue metoprolol.    Tobacco use: Counseled  DVT Prophylaxis: SCD's  Code Status: Full code   Family Communication: None at bedside  Disposition Plan: Remain inpatient-hopefully home soon.   Antimicrobial agents: Anti-infectives (From admission, onward)   Start     Dose/Rate Route Frequency Ordered Stop   03/01/18 1200  vancomycin (VANCOCIN) IVPB 750 mg/150 ml premix  Status:  Discontinued     750 mg 150 mL/hr over 60 Minutes Intravenous Every M-W-F (Hemodialysis) 02/28/18 0913 03/03/18 1401   02/28/18 0930  vancomycin (VANCOCIN) 1,750 mg in sodium chloride 0.9 % 500 mL IVPB     1,750 mg 250 mL/hr over 120 Minutes Intravenous  Once 02/28/18 0913 02/28/18 1323   02/23/18 1319  piperacillin-tazobactam (ZOSYN) IVPB 3.375 g  Status:  Discontinued     3.375 g 12.5 mL/hr over 240 Minutes Intravenous Every 12 hours 02/23/18 1319 03/04/18 1104      Procedures: 10/18>> EGD/colonoscopy 10/12>> ultrasound-guided liver biopsy  CONSULTS:  GI, nephrology and general surgery  Time spent: 25 minutes-Greater than 50% of this time was spent in counseling, explanation of diagnosis, planning of further management, and coordination of care.  MEDICATIONS: Scheduled Meds: . Chlorhexidine Gluconate Cloth  6 each Topical Q0600  . mouth rinse  15 mL Mouth Rinse BID  . metoprolol tartrate  25 mg Oral BID  . multivitamin  1 tablet Oral QHS  . nicotine  14 mg Transdermal Q2000  . pantoprazole  40 mg Oral BID   Continuous Infusions: . sodium chloride Stopped (02/28/18 1635)  .  dextrose 40 mL/hr at 03/01/18 0400  . ferric gluconate (FERRLECIT/NULECIT) IV 62.5 mg (03/03/18 0942)   PRN Meds:.sodium chloride, acetaminophen **OR** acetaminophen, calcium carbonate (dosed in mg elemental calcium), camphor-menthol **AND** hydrOXYzine, docusate sodium, feeding supplement (NEPRO CARB STEADY), HYDROmorphone  (DILAUDID) injection, labetalol, sorbitol, zolpidem   PHYSICAL EXAM: Vital signs: Vitals:   03/05/18 0900 03/05/18 0910 03/05/18 0920 03/05/18 0930  BP: 136/68 (!) 150/71 (!) 149/73 (!) 146/71  Pulse: 72 68 76 74  Resp: 17 14 17 18   Temp:      TempSrc:      SpO2: 99% 100% 100% 97%  Weight:      Height:       Filed Weights   03/01/18 1432 03/01/18 1908 03/03/18 0807  Weight: 69.9 kg 67 kg 67.9 kg   Body mass index is 24.16 kg/m.   General appearance:Awake, alert, not in any distress.  Eyes:no scleral icterus. HEENT: Atraumatic and Normocephalic Neck: supple, no JVD. Resp:Good air entry bilaterally,no rales or rhonchi CVS: S1 S2 regular, no murmurs.  GI: Bowel sounds present, minimally tender in the right lower quadrant.   Extremities: B/L Lower Ext shows no edema, both legs are warm to touch Neurology:  Non focal Psychiatric: Normal judgment and insight. Normal mood. Musculoskeletal:No digital cyanosis Skin:No Rash, warm and dry Wounds:N/A  I have personally reviewed following labs and imaging studies  LABORATORY DATA: CBC: Recent Labs  Lab 02/28/18 0435 03/01/18 0445 03/02/18 0452 03/03/18 0422 03/05/18 0310  WBC 16.2* 18.1* 16.7* 14.6* 13.5*  HGB 10.5* 8.9* 9.7* 9.6* 10.0*  HCT 32.4* 28.0* 30.9* 30.3* 31.6*  MCV 94.7 94.3 95.1 96.2 95.8  PLT 175 236 337 400 584*    Basic Metabolic Panel: Recent Labs  Lab 02/27/18 0605 03/01/18 1513 03/02/18 0452 03/05/18 0310  NA 134* 131* 135 133*  K 3.3* 2.8* 3.7 3.7  CL 97* 96* 98 97*  CO2 26 24 25 23   GLUCOSE 107* 107* 93 78  BUN 12 33* 15 12  CREATININE 4.15* 8.28* 5.04* 6.32*  CALCIUM 7.3* 7.3* 7.5* 8.0*  MG  --   --  2.0  --   PHOS  --   --   --  4.0    GFR: Estimated Creatinine Clearance: 7.6 mL/min (A) (by C-G formula based on SCr of 6.32 mg/dL (H)).  Liver Function Tests: Recent Labs  Lab 03/05/18 0310  AST 25  ALT 21  ALKPHOS 112  BILITOT 0.8  PROT 6.4*  ALBUMIN 2.0*  2.0*   No  results for input(s): LIPASE, AMYLASE in the last 168 hours. No results for input(s): AMMONIA in the last 168 hours.  Coagulation Profile: Recent Labs  Lab 02/26/18 1907  INR 1.17    Cardiac Enzymes: No results for input(s): CKTOTAL, CKMB, CKMBINDEX, TROPONINI in the last 168 hours.  BNP (last 3 results) No results for input(s): PROBNP in the last 8760 hours.  HbA1C: No results for input(s): HGBA1C in the last 72 hours.  CBG: Recent Labs  Lab 03/03/18 0401 03/03/18 0444 03/03/18 0740 03/03/18 0856 03/03/18 1324  GLUCAP 65* 75 81 96 76    Lipid Profile: No results for input(s): CHOL, HDL, LDLCALC, TRIG, CHOLHDL, LDLDIRECT in the last 72 hours.  Thyroid Function Tests: No results for input(s): TSH, T4TOTAL, FREET4, T3FREE, THYROIDAB in the last 72 hours.  Anemia Panel: No results for input(s): VITAMINB12, FOLATE, FERRITIN, TIBC, IRON, RETICCTPCT in the last 72 hours.  Urine analysis:    Component Value Date/Time   COLORURINE  YELLOW 03/14/2017 2330   APPEARANCEUR CLOUDY (A) 03/14/2017 2330   LABSPEC 1.020 03/14/2017 2330   PHURINE 6.0 03/14/2017 2330   GLUCOSEU 100 (A) 03/14/2017 2330   HGBUR MODERATE (A) 03/14/2017 2330   BILIRUBINUR NEGATIVE 03/14/2017 2330   KETONESUR NEGATIVE 03/14/2017 2330   PROTEINUR >300 (A) 03/14/2017 2330   NITRITE NEGATIVE 03/14/2017 2330   LEUKOCYTESUR NEGATIVE 03/14/2017 2330    Sepsis Labs: Lactic Acid, Venous    Component Value Date/Time   LATICACIDVEN 1.76 02/23/2018 1415    MICROBIOLOGY: Recent Results (from the past 240 hour(s))  MRSA PCR Screening     Status: None   Collection Time: 02/24/18 12:19 AM  Result Value Ref Range Status   MRSA by PCR NEGATIVE NEGATIVE Final    Comment:        The GeneXpert MRSA Assay (FDA approved for NASAL specimens only), is one component of a comprehensive MRSA colonization surveillance program. It is not intended to diagnose MRSA infection nor to guide or monitor treatment  for MRSA infections. Performed at [facility], [place] [address] [place], [place] [address]   Culture, blood (routine x 2)     Status: None   Collection Time: 02/24/18 10:15 AM  Result Value Ref Range Status   Specimen Description BLOOD RIGHT HAND  Final   Special Requests   Final    BOTTLES DRAWN AEROBIC ONLY Blood Culture adequate volume   Culture   Final    NO GROWTH 6 DAYS Performed at [facility], [place] [address], [place], [place] [address]    Report Status 03/02/2018 FINAL  Final  Culture, blood (routine x 2)     Status: None   Collection Time: 02/24/18 10:18 AM  Result Value Ref Range Status   Specimen Description BLOOD RIGHT HAND  Final   Special Requests   Final    BOTTLES DRAWN AEROBIC ONLY Blood Culture adequate volume   Culture   Final    NO GROWTH 6 DAYS Performed at [facility], [place] [address], [place], [place] [address]    Report Status 03/02/2018 FINAL  Final  Culture, blood (routine x 2)     Status: None   Collection Time: 02/28/18  9:33 AM  Result Value Ref Range Status   Specimen Description BLOOD BLOOD LEFT HAND  Final   Special Requests   Final    BOTTLES DRAWN AEROBIC ONLY Blood Culture results may not be optimal due to an inadequate volume of blood received in culture bottles   Culture   Final    NO GROWTH 5 DAYS Performed at [facility], [place] [address], [place], [place] [address]    Report Status 03/05/2018 FINAL  Final  Culture, blood (routine x 2)     Status: None   Collection Time: 02/28/18  9:42 AM  Result Value Ref Range Status   Specimen Description BLOOD BLOOD RIGHT HAND  Final   Special Requests   Final    BOTTLES DRAWN AEROBIC ONLY Blood Culture adequate volume   Culture   Final    NO GROWTH 5 DAYS Performed at [facility], [place] [address] [place], [place] [address]    Report Status 03/05/2018 FINAL  Final    RADIOLOGY STUDIES/RESULTS: Ct Abdomen Pelvis Wo Contrast  Result Date: 02/23/2018 CLINICAL  DATA:  Abdominal pain after dialysis.  Nausea and vomiting. EXAM: CT ABDOMEN AND PELVIS WITHOUT CONTRAST TECHNIQUE: Multidetector CT imaging of the abdomen and pelvis  was performed following the standard protocol without IV contrast. COMPARISON:  08/07/2016 FINDINGS: Lower chest: Dependent atelectasis bilaterally. Hepatobiliary: 4.5 x 5.2 cm heterogeneous low-density mass is identified in the inferior liver, at the junction of the right and left hepatic lobes. This is right at the gallbladder fossa and contracted gallbladder with known calcified gallstones is seen immediately adjacent. Liver otherwise unremarkable on this noncontrast exam. No intrahepatic or extrahepatic biliary dilation. Pancreas: No focal mass lesion. No dilatation of the main duct. No intraparenchymal cyst. No peripancreatic edema. Spleen: No splenomegaly. No focal mass lesion. Adrenals/Urinary Tract: No adrenal nodule or mass. Right kidney unremarkable. Adjacent nonobstructing 7 mm stones are identified in the interpolar left kidney. Ureters unremarkable. Bladder is decompressed. Stomach/Bowel: Stomach is nondistended. No gastric wall thickening. No evidence of outlet obstruction. Duodenum is normally positioned as is the ligament of Treitz. No small bowel wall thickening. No small bowel dilatation. The appendix is best seen on coronal and sagittal imaging and has normal features. There is edema/inflammation around the cecum with cecal wall thickening evident (59/2). Right-sided diverticuli are noted in the cecum and ascending colon. Diverticular changes are noted in the left colon without evidence of diverticulitis. Vascular/Lymphatic: There is abdominal aortic atherosclerosis without aneurysm. There is no gastrohepatic or hepatoduodenal ligament lymphadenopathy. No intraperitoneal or retroperitoneal lymphadenopathy. No pelvic sidewall lymphadenopathy. Reproductive: The uterus has normal CT imaging appearance. There is no adnexal mass. Other:  No intraperitoneal free fluid. Musculoskeletal: No worrisome lytic or sclerotic osseous abnormality. IMPRESSION: 1. Wall thickening in the cecum with pericecal edema/inflammation on a background of right-sided diverticulosis. Imaging features could be related to right-sided diverticulitis. Infectious/inflammatory right colitis also a consideration and right-sided neoplasm cannot be entirely excluded based on this CT scan. 2. Normal terminal ileum and appendix. 3. 4.5 x 5.2 cm heterogeneous low-density lesion in the inferior liver is new since 08/07/2016. This is immediately contiguous with the gallbladder fossa. Given changes in the right colon, liver abscess would be a consideration. Primary/secondary liver neoplasm could also have this appearance and proximity to the contracted gallbladder raises the question of gallbladder origin. If renal function permits, follow-up CT or MRI with contrast would likely prove helpful to further evaluate. 4.  Aortic Atherosclerois (ICD10-170.0) Electronically Signed   By: Misty Stanley M.D.   On: 02/23/2018 15:26   Nm Gi Blood Loss  Addendum Date: 02/24/2018   ADDENDUM REPORT: 02/24/2018 19:15 ADDENDUM: Study discussed by telephone with Dr. Silverio Decamp on 02/24/2018 at 1904 hours. Electronically Signed   By: Genevie Ann M.D.   On: 02/24/2018 19:15   Result Date: 02/24/2018 CLINICAL DATA:  72 year old female with right upper quadrant pain nausea and vomiting. Onset of dark bloody stools today. EXAM: NUCLEAR MEDICINE GASTROINTESTINAL BLEEDING SCAN TECHNIQUE: Sequential abdominal images were obtained following intravenous administration of Tc-28m labeled red blood cells. RADIOPHARMACEUTICALS:  26.1 mCi Tc-61m pertechnetate in-vitro labeled red cells. COMPARISON:  CT Abdomen and Pelvis 03/05/2018 and earlier. FINDINGS: Physiologic blood pool activity demonstrated throughout the 1st hour of imaging. Toward the end of the 1st hour there is questionable faint left abdominal small bowel  type activity (image 53). During the 2nd hour there is definite abnormal radiotracer activity in the left mid abdomen which appears to move through multiple closely looped segments of bowel in that region. Comparison of the large and small bowel anatomy on the coronal CT images yesterday suggests small-bowel position. IMPRESSION: Positive GI bleeding is detected during the 2nd hour of the study in the left abdomen. The morphology  favors a small-bowel bleeding source. Electronically Signed: By: Genevie Ann M.D. On: 02/24/2018 18:40   Ct Abdomen Pelvis W Contrast  Result Date: 03/04/2018 CLINICAL DATA:  Diverticulitis.  Possible colonic mass. EXAM: CT ABDOMEN AND PELVIS WITH CONTRAST TECHNIQUE: Multidetector CT imaging of the abdomen and pelvis was performed using the standard protocol following bolus administration of intravenous contrast. CONTRAST:  121mL OMNIPAQUE IOHEXOL 300 MG/ML  SOLN COMPARISON:  02/23/2018 FINDINGS: Lower Chest: Mild right basilar atelectasis is increased since previous study. Hepatobiliary: Several tiny gallstones are seen. The gallbladder is contracted. A complex multilocular cystic lesion is seen in the liver adjacent to the gallbladder fossa which measures 4.9 x 5.0 cm and has not significantly changed since previous study. A few other smaller low-attenuation lesions in the inferior right lobe are also stable. No new liver masses are identified. New mild dilatation of intrahepatic bile ducts is seen in the superior right hepatic lobe. Pancreas:  No mass or inflammatory changes. Spleen: Within normal limits in size and appearance. Adrenals/Urinary Tract: No masses identified. No evidence of hydronephrosis. Two adjacent sub-cm calculi are seen in the interpolar region of the left kidney. Stomach/Bowel: Increased colonic wall thickening is now seen involving the cecum, ascending, and transverse portions of the colon, consistent with colitis. There is no evidence of bowel obstruction. No  evidence of pneumatosis or free intraperitoneal air. Diverticulosis is seen involving the descending and proximal sigmoid colon, however there is no evidence of diverticulitis in these regions. Narrowing abnormal fluid collections identified. Vascular/Lymphatic: Mild adenopathy is seen in the porta hepatis cysts which is increased since previous study. This measures 2.7 x 2.3 cm and shows on image 27/3. Aortic atherosclerosis. No abdominal aortic aneurysm. Reproductive:  No mass or other significant abnormality. Other:  None. Musculoskeletal:  No suspicious bone lesions identified. IMPRESSION: Increased colonic wall thickening throughout the cecum, ascending and transverse colon, consistent with colitis. Stable complex cystic lesions in right hepatic lobe, and mild intrahepatic biliary ductal dilatation in the superior right hepatic lobe. These findings are suspicious for hepatic abscesses, although differential diagnosis also includes liver metastases and cholangiocarcinoma. Mild porta hepatis lymphadenopathy, shows mild increase since previous study. Cholelithiasis, without evidence of acute cholecystitis. Electronically Signed   By: Earle Gell M.D.   On: 03/04/2018 20:14   Ct Abdomen Pelvis W Contrast  Result Date: 02/23/2018 CLINICAL DATA:  72 year old female with history of abdominal pain and nausea and vomiting since yesterday. EXAM: CT ABDOMEN AND PELVIS WITH CONTRAST TECHNIQUE: Multidetector CT imaging of the abdomen and pelvis was performed using the standard protocol following bolus administration of intravenous contrast. CONTRAST:  146mL OMNIPAQUE IOHEXOL 300 MG/ML  SOLN COMPARISON:  Noncontrast CT the abdomen and pelvis 02/23/2018. FINDINGS: Lower chest: Dependent areas of scarring and/or atelectasis in the lower lobes of the lungs bilaterally. Hepatobiliary: As noted on the prior study there is a large intermediate attenuation lesion in the central aspect of the liver adjacent to the gallbladder  fossa. This is irregular in shape and therefore difficult to accurately measure, but this measures at least 5.2 x 4.4 cm (axial image 32 of series 3) predominantly involving segments 4B and 5. This lesion appears to have some thick internal septations. There is a smaller satellite lesion in the central aspect of segment 5 measuring 1.8 cm (axial image 34 of series 3). In addition, inferior to the lesion there are some dilated bile ducts, likely related to mass effect from the lesion centrally causing mild obstruction. In the posterior aspect  of segment 6 of the liver (axial image 33 of series 3) there is an additional 11 mm lesion which is intermediate attenuation. Calcified gallstones in the gallbladder which appears nearly completely contracted. Pancreas: No pancreatic mass. No pancreatic ductal dilatation. No pancreatic or peripancreatic fluid or inflammatory changes. Spleen: Unremarkable. Adrenals/Urinary Tract: 2 nonobstructive calculi are noted in the collecting system of left kidney measuring up to 8 mm. Right kidney and bilateral adrenal glands are normal in appearance. No hydroureteronephrosis. Urinary bladder is normal in appearance. Stomach/Bowel: Normal appearance of the stomach. No pathologic dilatation of small bowel or colon. Numerous colonic diverticulae are noted, most evident in the descending colon. Mural thickening in the region of the cecum, concerning for focal area of colitis, or potentially right-sided diverticulitis. The possibility of underlying mass is not excluded. Appendix is not confidently identified on today's examination. Vascular/Lymphatic: Aortic atherosclerosis, without evidence of aneurysm or dissection in the abdominal or pelvic vasculature. No lymphadenopathy noted in the abdomen or pelvis. Reproductive: Uterus and ovaries are unremarkable in appearance. Other: No significant volume of ascites.  No pneumoperitoneum. Musculoskeletal: There are no aggressive appearing lytic or  blastic lesions noted in the visualized portions of the skeleton. IMPRESSION: 1. Large multi-cystic lesion in the central aspect of the liver adjacent to the gallbladder fossa, with several smaller satellite lesions. These are all new compared to prior CT the chest, abdomen and pelvis 08/07/2016, concerning for intrahepatic abscesses. 2. Extensive mural thickening and inflammatory changes in the region of the cecum. This is nonspecific, and could reflect either right-sided diverticulitis, focal area of colitis, or potentially even underlying neoplasm. 3. Cholelithiasis. Gallbladder is nearly completely contracted without surrounding inflammatory changes to suggest an acute cholecystitis at this time. 4. Left-sided nephrolithiasis measuring up to 8 mm in the upper pole collecting system of left kidney. No ureteral stones or findings of urinary tract obstruction. 5. Aortic atherosclerosis. Electronically Signed   By: Vinnie Langton M.D.   On: 02/23/2018 21:23   US Biopsy (liver)  Result Date: 02/27/2018 INDICATION: 72 year old with an indeterminate liver lesion. Concern for mass versus abscess. EXAM: ULTRASOUND-GUIDED LIVER LESION BIOPSY MEDICATIONS: None. ANESTHESIA/SEDATION: Moderate (conscious) sedation was employed during this procedure. A total of Versed 0.5 mg and Fentanyl 25 mcg was administered intravenously. Moderate Sedation Time: 10 minutes. The patient's level of consciousness and vital signs were monitored continuously by radiology nursing throughout the procedure under my direct supervision. FLUOROSCOPY TIME:  None COMPLICATIONS: None immediate. PROCEDURE: Informed written consent was obtained from the patient after a thorough discussion of the procedural risks, benefits and alternatives. All questions were addressed. A timeout was performed prior to the initiation of the procedure. Liver was evaluated with ultrasound. Heterogeneous lesion in the right hepatic lobe adjacent to the gallbladder was  identified. The right abdomen was prepped and draped in sterile fashion. Maximal barrier sterile technique was utilized including caps, mask, sterile gowns, sterile gloves, sterile drape, hand hygiene and skin antiseptic. Overlying skin was anesthetized with 1% lidocaine. 17 gauge coaxial needle was directed into this heterogeneous lesion with ultrasound guidance. No fluid could be aspirated from the 17 gauge coaxial needle. Three core biopsies were obtained with an 18 gauge core device. Solid core specimens were obtained and placed in formalin. 17 gauge needle was removed without complication. Bandage placed over the puncture site. FINDINGS: Heterogeneous solid lesion in the right hepatic lobe. No fluid could be aspirated from this lesion. Biopsy needle confirmed within the lesion. IMPRESSION: Ultrasound-guided core biopsies of the right  hepatic mass. Electronically Signed   By: Markus Daft M.D.   On: 02/27/2018 15:22   Ir Abdomen US Limited  Result Date: 02/26/2018 CLINICAL DATA:  72 year old with an indeterminate liver lesion. EXAM: ULTRASOUND ABDOMEN LIMITED COMPARISON:  CT 02/25/2018 FINDINGS: Liver was evaluated with ultrasound. At the area of concern, there is a heterogeneous hypoechoic structure which appears solid rather than cystic. This lesion is adjacent to calcifications associated with a contracted and possibly abnormal gallbladder. Liver lesion is more suggestive for a solid lesion rather cystic collection or abscess. IMPRESSION: Dominant liver lesion is concerning for a neoplastic process. Plan for ultrasound-guided biopsy of this lesion. Electronically Signed   By: Markus Daft M.D.   On: 02/26/2018 21:48   Ct Angio Abd/pel W/ And/or W/o  Result Date: 02/25/2018 CLINICAL DATA:  Active GI bleeding. EXAM: CTA ABDOMEN AND PELVIS wITHOUT AND WITH CONTRAST TECHNIQUE: Multidetector CT imaging of the abdomen and pelvis was performed using the standard protocol during bolus administration of  intravenous contrast. Multiplanar reconstructed images and MIPs were obtained and reviewed to evaluate the vascular anatomy. CONTRAST:  127mL ISOVUE-370 IOPAMIDOL (ISOVUE-370) INJECTION 76% COMPARISON:  CT, 02/23/2018. FINDINGS: VASCULAR Aorta: Normal in caliber. There is atherosclerosis throughout the aorta with no significant stenosis. Celiac: Minor plaque at the origin.  No significant stenosis. SMA: Mild plaque at the origin.  No significant stenosis. Renals: Mild plaque just beyond the origin on the left. Minimal plaque just beyond the origin on the right. No significant stenosis. IMA: Patent without evidence of aneurysm, dissection, vasculitis or significant stenosis. Inflow: Atherosclerotic changes along the common iliac and internal iliac arteries. No significant stenosis. Mild dilation of the right common iliac artery to 14 mm. Proximal Outflow: Mild plaque involving both common femoral arteries without significant stenosis. Veins: Patent. Review of the MIP images confirms the above findings. NON-VASCULAR Lower chest: Lower lobe dependent atelectasis. No convincing pneumonia. No pulmonary edema. Heart is mildly enlarged. Hepatobiliary: Heterogeneous cystic appearing mass in the right lobe is unchanged from the study performed 2 days ago. There are 2 smaller adjacent masses that lie posterior and inferior to the dominant right lobe mass. No other liver lesions. There is mild intrahepatic bile duct dilation in the upper right lobe, segment 7. Gallbladder is mostly collapsed. There are gallstones. No bile duct dilation. Pancreas: Unremarkable. No pancreatic ductal dilatation or surrounding inflammatory changes. Spleen: Normal in size without focal abnormality. Adrenals/Urinary Tract: No adrenal masses. Kidneys are normal in size, orientation and position with symmetric enhancement. There are 2 adjacent nonobstructing stones in the left kidney at the midpole. No renal masses. No hydronephrosis. Normal ureters.  Bladder is mostly collapsed. It contains contrast enhanced urine. Stomach/Bowel: As noted on the prior CT, there is a cecal mass versus eccentric wall thickening. Hazy inflammation is now noted adjacent to this. There are several diverticula. Findings support right colon diverticulitis. No abscess. No extraluminal air. There is no evidence of arterial extravasation of contrast into the right colon, or elsewhere in the bowel, to indicate an active bleeding source. Right colon is mildly distended. No other areas of wall thickening. No other evidence of a mass. There are additional colonic diverticula with no other inflammatory changes. Stomach and small bowel unremarkable. Normal appendix visualized. Lymphatic: Mildly prominent gastrohepatic ligament lymph nodes, none pathologically enlarged. Reproductive: Uterus and bilateral adnexa are unremarkable. Other: No abdominal wall hernia or abnormality. No abdominopelvic ascites. Musculoskeletal: No fracture or acute finding. No osteoblastic or osteolytic lesions. IMPRESSION: VASCULAR 1. Atherosclerotic  changes as described without significant stenosis. 2. No evidence of an active arterial bleeding source in the bowel. NON-VASCULAR 1. Findings are similar to the prior CT. There is eccentric wall thickening versus a possible mass in the cecum. There are associated diverticula and adjacent inflammation. Findings are most likely due to diverticulitis. No extraluminal air or abscess. There is no visualized arterial bleeding, although this is likely the source of GI bleeding. 2. Right liver lobe masses as described on the prior CT. Differential diagnosis includes liver abscesses. 3. No other acute abnormality in the abdomen and pelvis. 4. There are other colonic diverticula.  No other diverticulitis. 5. Left intrarenal stones.  Gallstones. 6. Aortic atherosclerosis. Electronically Signed   By: Lajean Manes M.D.   On: 02/25/2018 18:43     LOS: 10 days   Oren Binet,  MD  [facility]  If 7PM-7AM, please contact night-coverage  Please page via [email or web address]-click on MD name and type text message  03/05/2018, 11:09 AM

## 2018-03-05 NOTE — Anesthesia Procedure Notes (Signed)
Procedure Name: MAC Date/Time: 03/05/2018 7:32 AM Performed by: White, Amedeo Plenty, CRNA Pre-anesthesia Checklist: Patient identified, Emergency Drugs available, Suction available and Patient being monitored Patient Re-evaluated:Patient Re-evaluated prior to induction Oxygen Delivery Method: Nasal cannula

## 2018-03-05 NOTE — Transfer of Care (Signed)
Immediate Anesthesia Transfer of Care Note  Patient: Mary Chapman  Procedure(s) Performed: ENTEROSCOPY (N/A ) COLONOSCOPY WITH PROPOFOL (N/A ) BIOPSY SUBMUCOSAL INJECTION  Patient Location: Endoscopy Unit  Anesthesia Type:MAC  Level of Consciousness: drowsy and responds to stimulation  Airway & Oxygen Therapy: Patient Spontanous Breathing and Patient connected to nasal cannula oxygen  Post-op Assessment: Report given to RN and Post -op Vital signs reviewed and stable  Post vital signs: Reviewed and stable  Last Vitals:  Vitals Value Taken Time  BP    Temp    Pulse    Resp    SpO2      Last Pain:  Vitals:   03/05/18 0654  TempSrc: Oral  PainSc: 0-No pain      Patients Stated Pain Goal: 3 (15/94/70 7615)  Complications: No apparent anesthesia complications

## 2018-03-06 MED ORDER — OXYCODONE-ACETAMINOPHEN 5-325 MG PO TABS: 1.0000 | ORAL_TABLET | Freq: Four times a day (QID) | ORAL | 0 refills | Status: DC | PRN

## 2018-03-06 MED ORDER — NEPRO/CARBSTEADY PO LIQD: 237.0000 mL | Freq: Three times a day (TID) | ORAL | 0 refills | Status: AC | PRN

## 2018-03-06 MED ORDER — POLYETHYLENE GLYCOL 3350 17 G PO PACK: 17.0000 g | PACK | Freq: Every day | ORAL | 0 refills | Status: AC

## 2018-03-06 MED ORDER — PANTOPRAZOLE SODIUM 40 MG PO TBEC: 40.0000 mg | DELAYED_RELEASE_TABLET | Freq: Every day | ORAL | 0 refills | Status: DC

## 2018-03-06 NOTE — Plan of Care (Signed)
Nsg Discharge Note  Admit Date:  02/23/2018 Discharge date: 03/06/2018   [name] to be D/C'd Home per MD order.  AVS completed.  Copy for chart, and copy for patient signed, and dated. Patient/caregiver able to verbalize understanding.  Discharge Medication: Allergies as of 03/06/2018   No Known Allergies      Medication List     TAKE these medications    feeding supplement (NEPRO CARB STEADY) Liqd Take 237 mLs by mouth 3 (three) times daily as needed (Supplement).   metoprolol tartrate 50 MG tablet Commonly known as:  LOPRESSOR Take 50 mg by mouth 2 (two) times daily.   multivitamin Tabs tablet Take 1 tablet by mouth at bedtime.   nicotine 21 mg/24hr patch Commonly known as:  NICODERM CQ - dosed in mg/24 hours Place 1 patch (21 mg total) onto the skin daily.   oxyCODONE-acetaminophen 5-325 MG tablet Commonly known as:  PERCOCET/ROXICET Take 1 tablet by mouth every 6 (six) hours as needed for severe pain.   pantoprazole 40 MG tablet Commonly known as:  PROTONIX Take 1 tablet (40 mg total) by mouth daily.   polyethylene glycol packet Commonly known as:  MIRALAX / GLYCOLAX Take 17 g by mouth daily.   Vitamin D (Ergocalciferol) 50000 units Caps capsule Commonly known as:  DRISDOL Take 50,000 Units by mouth once a week.        Discharge Assessment: Vitals:   03/05/18 2158 03/06/18 0532  BP:  108/64  Pulse:  77  Resp:  16  Temp: 98.3 F (36.8 C) 98.2 F (36.8 C)  SpO2:  99%   Skin clean, dry and intact without evidence of skin break down, no evidence of skin tears noted. IV catheter discontinued intact. Site without signs and symptoms of complications - no redness or edema noted at insertion site, patient denies c/o pain - only slight tenderness at site.  Dressing with slight pressure applied.  D/c Instructions-Education: Discharge instructions given to patient/family with verbalized understanding. D/c education completed with patient/family including  follow up instructions, medication list, d/c activities limitations if indicated, with other d/c instructions as indicated by MD - patient able to verbalize understanding, all questions fully answered. Patient instructed to return to ED, call 911, or call MD for any changes in condition.  Patient escorted via [place], and D/C home via private auto.  Salley Slaughter, RN 03/06/2018 1:08 PM

## 2018-03-06 NOTE — Discharge Summary (Signed)
PATIENT DETAILS Name: Mary Chapman Age: 72 y.o. Sex: female Date of Birth: 1946/04/29 MRN: [id number]. Admitting Physician: Karmen Bongo, MD [id number], [facility]  Admit Date: 02/23/2018 Discharge date: 03/06/2018  Recommendations for Outpatient Follow-up:  1. Follow up with PCP in 1-2 weeks 2. Please obtain BMP/CBC in one week 3. Colonoscopy biopsy results are pending-these will need to be followed. 4. Ensure follow-up with oncology-electronic referral sent via epic.  Admitted From:  Home  Disposition: [place]: No  Equipment/Devices: None  Discharge Condition: Stable  CODE STATUS: FULL CODE  Diet recommendation:  Heart Healthy  Brief Summary: See H&P, Labs, Consult and Test reports for all details in brief, Patient is a 72 y.o. female with history of ESRD on HD presented with abdominal pain-further work-up revealed possible malignancy versus infection in the cecum area.  CT scan also showed numerous masses in the liver-these were initially felt to be cystic in nature, however upon further evaluation by IR-these were thought to be solid and more consistent with malignancy.  Subsequently underwent ultrasound-guided biopsy on 10/12-positive for adenocarcinoma, subsequently underwent colonoscopy on 10/18-results are pending.  Patient was evaluated by general surgery, GI during this hospital stay.  See below for further details.  Brief Hospital Course: Cecal infection versus malignancy along with liver lesions: Initially there were concerns of right-sided diverticulitis and possible metastatic liver abscess-however upon further evaluation it is now felt that she probably has a cecal malignancy with metastases lesions.  Underwent ultrasound-guided biopsy on 10/12, positive for adenocarcinoma. EGD with enteroscopy on 10/18 did not show any obvious malignancy, however a colonoscopy showed a ulcerated lesion in the cecum that per GI is highly concerning for  malignancy.  Biopsy is currently pending.  No longer on antimicrobial therapy as currently not felt to have infection.  Followed closely by GI and general surgery during his hospital stay.  Electronic referral via epic sent to oncology team to see if we can get this patient followed up at the GI oncology clinic, oncology will get in touch with the patient and arrange for outpatient follow-up.  Spoke with GI MD on call (Dr. Hilarie Fredrickson) this morning-okay to discharge-with the above-noted plans.  Currently patient is doing well-mild pain in the right lower quadrant continues-she is ambulating the hallway without any major issues.  Tolerating diet as well.  Melena with acute blood loss anemia: No further melena-bleeding scan on 10/9 showed possible source in the small bowel-did require 2 units of PRBC.  Hemoglobin stable-EGD/colonoscopy negative for any bleeding foci.  Continue to follow CBC periodically.    ESRD: On HD MWF-nephrology followe and directed care.    Patient instructed to continue to follow with dialysis center at her usual schedule.  Hypertension: BP controlled-continue metoprolol.    Tobacco use: Counseled  Procedures/Studies: 10/18>> EGD/colonoscopy 10/12>> ultrasound-guided liver biopsy  Discharge Diagnoses:  Principal Problem:   Diverticulitis Active Problems:   Tobacco abuse   Essential hypertension   Focal segmental glomerulosclerosis   Hyperlipidemia   ESRD on dialysis [facility])   Gastrointestinal hemorrhage associated with anorectal source   Mass of right lobe of liver   GI bleed   Mass of cecum with bleeding   Discharge Instructions:  Activity:  As tolerated   Discharge Instructions    Call MD for:  persistant nausea and vomiting   Complete by:  As directed    Call MD for:  severe uncontrolled pain   Complete by:  As directed    Diet -  low sodium heart healthy   Complete by:  As directed    Discharge instructions   Complete by:  As directed    Follow with  Primary MD  Center, [place] in 1 week  Oncology/Cancer center will call you for a follow up appointment  Follow with gastroenterology and general surgery in the next few weeks.  Your colonoscopy biopsies are pending and will need to be followed by the outpatient MDs.  Follow at your Dialysis center at usual schedule  Please get a complete blood count and chemistry panel checked by your Primary MD at your next visit, and again as instructed by your Primary MD.  Get Medicines reviewed and adjusted: Please take all your medications with you for your next visit with your Primary MD  Laboratory/radiological data: Please request your Primary MD to go over all hospital tests and procedure/radiological results at the follow up, please ask your Primary MD to get all Hospital records sent to his/her office.  In some cases, they will be blood work, cultures and biopsy results pending at the time of your discharge. Please request that your primary care M.D. follows up on these results.  Also Note the following: If you experience worsening of your admission symptoms, develop shortness of breath, life threatening emergency, suicidal or homicidal thoughts you must seek medical attention immediately by calling 911 or calling your MD immediately  if symptoms less severe.  You must read complete instructions/literature along with all the possible adverse reactions/side effects for all the Medicines you take and that have been prescribed to you. Take any new Medicines after you have completely understood and accpet all the possible adverse reactions/side effects.   Do not drive when taking Pain medications or sleeping medications (Benzodaizepines)  Do not take more than prescribed Pain, Sleep and Anxiety Medications. It is not advisable to combine anxiety,sleep and pain medications without talking with your primary care practitioner  Special Instructions: If you have smoked or chewed Tobacco  in the  last 2 yrs please stop smoking, stop any regular Alcohol  and or any Recreational drug use.  Wear Seat belts while driving.  Please note: You were cared for by a hospitalist during your hospital stay. Once you are discharged, your primary care physician will handle any further medical issues. Please note that NO REFILLS for any discharge medications will be authorized once you are discharged, as it is imperative that you return to your primary care physician (or establish a relationship with a primary care physician if you do not have one) for your post hospital discharge needs so that they can reassess your need for medications and monitor your lab values.   Increase activity slowly   Complete by:  As directed      Allergies as of 03/06/2018   No Known Allergies     Medication List    TAKE these medications   feeding supplement (NEPRO CARB STEADY) Liqd Take 237 mLs by mouth 3 (three) times daily as needed (Supplement).   metoprolol tartrate 50 MG tablet Commonly known as:  LOPRESSOR Take 50 mg by mouth 2 (two) times daily.   multivitamin Tabs tablet Take 1 tablet by mouth at bedtime.   nicotine 21 mg/24hr patch Commonly known as:  NICODERM CQ - dosed in mg/24 hours Place 1 patch (21 mg total) onto the skin daily.   oxyCODONE-acetaminophen 5-325 MG tablet Commonly known as:  PERCOCET/ROXICET Take 1 tablet by mouth every 6 (six) hours as needed for severe  pain.   pantoprazole 40 MG tablet Commonly known as:  PROTONIX Take 1 tablet (40 mg total) by mouth daily.   polyethylene glycol packet Commonly known as:  MIRALAX / GLYCOLAX Take 17 g by mouth daily.   Vitamin D (Ergocalciferol) 50000 units Caps capsule Commonly known as:  DRISDOL Take 50,000 Units by mouth once a week.      Follow-up [facility]. Schedule an appointment as soon as possible for a visit in 1 week(s).   Contact information: [address] [place]  [id number] [facility].   Why:  they will call you with a appointment-if you do not hear from them in the next few days-please call [phone] for a appointment.          No Known Allergies  Consultations:   GI, nephrology and general surgery   Other Procedures/Studies: Ct Abdomen Pelvis Wo Contrast  Result Date: 02/23/2018 CLINICAL DATA:  Abdominal pain after dialysis.  Nausea and vomiting. EXAM: CT ABDOMEN AND PELVIS WITHOUT CONTRAST TECHNIQUE: Multidetector CT imaging of the abdomen and pelvis was performed following the standard protocol without IV contrast. COMPARISON:  08/07/2016 FINDINGS: Lower chest: Dependent atelectasis bilaterally. Hepatobiliary: 4.5 x 5.2 cm heterogeneous low-density mass is identified in the inferior liver, at the junction of the right and left hepatic lobes. This is right at the gallbladder fossa and contracted gallbladder with known calcified gallstones is seen immediately adjacent. Liver otherwise unremarkable on this noncontrast exam. No intrahepatic or extrahepatic biliary dilation. Pancreas: No focal mass lesion. No dilatation of the main duct. No intraparenchymal cyst. No peripancreatic edema. Spleen: No splenomegaly. No focal mass lesion. Adrenals/Urinary Tract: No adrenal nodule or mass. Right kidney unremarkable. Adjacent nonobstructing 7 mm stones are identified in the interpolar left kidney. Ureters unremarkable. Bladder is decompressed. Stomach/Bowel: Stomach is nondistended. No gastric wall thickening. No evidence of outlet obstruction. Duodenum is normally positioned as is the ligament of Treitz. No small bowel wall thickening. No small bowel dilatation. The appendix is best seen on coronal and sagittal imaging and has normal features. There is edema/inflammation around the cecum with cecal wall thickening evident (59/2). Right-sided diverticuli are noted in the cecum and ascending colon. Diverticular  changes are noted in the left colon without evidence of diverticulitis. Vascular/Lymphatic: There is abdominal aortic atherosclerosis without aneurysm. There is no gastrohepatic or hepatoduodenal ligament lymphadenopathy. No intraperitoneal or retroperitoneal lymphadenopathy. No pelvic sidewall lymphadenopathy. Reproductive: The uterus has normal CT imaging appearance. There is no adnexal mass. Other: No intraperitoneal free fluid. Musculoskeletal: No worrisome lytic or sclerotic osseous abnormality. IMPRESSION: 1. Wall thickening in the cecum with pericecal edema/inflammation on a background of right-sided diverticulosis. Imaging features could be related to right-sided diverticulitis. Infectious/inflammatory right colitis also a consideration and right-sided neoplasm cannot be entirely excluded based on this CT scan. 2. Normal terminal ileum and appendix. 3. 4.5 x 5.2 cm heterogeneous low-density lesion in the inferior liver is new since 08/07/2016. This is immediately contiguous with the gallbladder fossa. Given changes in the right colon, liver abscess would be a consideration. Primary/secondary liver neoplasm could also have this appearance and proximity to the contracted gallbladder raises the question of gallbladder origin. If renal function permits, follow-up CT or MRI with contrast would likely prove helpful to further evaluate. 4.  Aortic Atherosclerois (ICD10-170.0) Electronically Signed   By: Misty Stanley M.D.   On: 02/23/2018 15:26   Nm  Gi Blood Loss  Addendum Date: 02/24/2018   ADDENDUM REPORT: 02/24/2018 19:15 ADDENDUM: Study discussed by telephone with Dr. Silverio Decamp on 02/24/2018 at 1904 hours. Electronically Signed   By: Genevie Ann M.D.   On: 02/24/2018 19:15   Result Date: 02/24/2018 CLINICAL DATA:  72 year old female with right upper quadrant pain nausea and vomiting. Onset of dark bloody stools today. EXAM: NUCLEAR MEDICINE GASTROINTESTINAL BLEEDING SCAN TECHNIQUE: Sequential abdominal images  were obtained following intravenous administration of Tc-54m labeled red blood cells. RADIOPHARMACEUTICALS:  26.1 mCi Tc-38m pertechnetate in-vitro labeled red cells. COMPARISON:  CT Abdomen and Pelvis 03/05/2018 and earlier. FINDINGS: Physiologic blood pool activity demonstrated throughout the 1st hour of imaging. Toward the end of the 1st hour there is questionable faint left abdominal small bowel type activity (image 53). During the 2nd hour there is definite abnormal radiotracer activity in the left mid abdomen which appears to move through multiple closely looped segments of bowel in that region. Comparison of the large and small bowel anatomy on the coronal CT images yesterday suggests small-bowel position. IMPRESSION: Positive GI bleeding is detected during the 2nd hour of the study in the left abdomen. The morphology favors a small-bowel bleeding source. Electronically Signed: By: Genevie Ann M.D. On: 02/24/2018 18:40   Ct Abdomen Pelvis W Contrast  Result Date: 03/04/2018 CLINICAL DATA:  Diverticulitis.  Possible colonic mass. EXAM: CT ABDOMEN AND PELVIS WITH CONTRAST TECHNIQUE: Multidetector CT imaging of the abdomen and pelvis was performed using the standard protocol following bolus administration of intravenous contrast. CONTRAST:  123mL OMNIPAQUE IOHEXOL 300 MG/ML  SOLN COMPARISON:  02/23/2018 FINDINGS: Lower Chest: Mild right basilar atelectasis is increased since previous study. Hepatobiliary: Several tiny gallstones are seen. The gallbladder is contracted. A complex multilocular cystic lesion is seen in the liver adjacent to the gallbladder fossa which measures 4.9 x 5.0 cm and has not significantly changed since previous study. A few other smaller low-attenuation lesions in the inferior right lobe are also stable. No new liver masses are identified. New mild dilatation of intrahepatic bile ducts is seen in the superior right hepatic lobe. Pancreas:  No mass or inflammatory changes. Spleen: Within  normal limits in size and appearance. Adrenals/Urinary Tract: No masses identified. No evidence of hydronephrosis. Two adjacent sub-cm calculi are seen in the interpolar region of the left kidney. Stomach/Bowel: Increased colonic wall thickening is now seen involving the cecum, ascending, and transverse portions of the colon, consistent with colitis. There is no evidence of bowel obstruction. No evidence of pneumatosis or free intraperitoneal air. Diverticulosis is seen involving the descending and proximal sigmoid colon, however there is no evidence of diverticulitis in these regions. Narrowing abnormal fluid collections identified. Vascular/Lymphatic: Mild adenopathy is seen in the porta hepatis cysts which is increased since previous study. This measures 2.7 x 2.3 cm and shows on image 27/3. Aortic atherosclerosis. No abdominal aortic aneurysm. Reproductive:  No mass or other significant abnormality. Other:  None. Musculoskeletal:  No suspicious bone lesions identified. IMPRESSION: Increased colonic wall thickening throughout the cecum, ascending and transverse colon, consistent with colitis. Stable complex cystic lesions in right hepatic lobe, and mild intrahepatic biliary ductal dilatation in the superior right hepatic lobe. These findings are suspicious for hepatic abscesses, although differential diagnosis also includes liver metastases and cholangiocarcinoma. Mild porta hepatis lymphadenopathy, shows mild increase since previous study. Cholelithiasis, without evidence of acute cholecystitis. Electronically Signed   By: Earle Gell M.D.   On: 03/04/2018 20:14   Ct Abdomen Pelvis W Contrast  Result Date: 02/23/2018 CLINICAL DATA:  72 year old female with history of abdominal pain and nausea and vomiting since yesterday. EXAM: CT ABDOMEN AND PELVIS WITH CONTRAST TECHNIQUE: Multidetector CT imaging of the abdomen and pelvis was performed using the standard protocol following bolus administration of  intravenous contrast. CONTRAST:  129mL OMNIPAQUE IOHEXOL 300 MG/ML  SOLN COMPARISON:  Noncontrast CT the abdomen and pelvis 02/23/2018. FINDINGS: Lower chest: Dependent areas of scarring and/or atelectasis in the lower lobes of the lungs bilaterally. Hepatobiliary: As noted on the prior study there is a large intermediate attenuation lesion in the central aspect of the liver adjacent to the gallbladder fossa. This is irregular in shape and therefore difficult to accurately measure, but this measures at least 5.2 x 4.4 cm (axial image 32 of series 3) predominantly involving segments 4B and 5. This lesion appears to have some thick internal septations. There is a smaller satellite lesion in the central aspect of segment 5 measuring 1.8 cm (axial image 34 of series 3). In addition, inferior to the lesion there are some dilated bile ducts, likely related to mass effect from the lesion centrally causing mild obstruction. In the posterior aspect of segment 6 of the liver (axial image 33 of series 3) there is an additional 11 mm lesion which is intermediate attenuation. Calcified gallstones in the gallbladder which appears nearly completely contracted. Pancreas: No pancreatic mass. No pancreatic ductal dilatation. No pancreatic or peripancreatic fluid or inflammatory changes. Spleen: Unremarkable. Adrenals/Urinary Tract: 2 nonobstructive calculi are noted in the collecting system of left kidney measuring up to 8 mm. Right kidney and bilateral adrenal glands are normal in appearance. No hydroureteronephrosis. Urinary bladder is normal in appearance. Stomach/Bowel: Normal appearance of the stomach. No pathologic dilatation of small bowel or colon. Numerous colonic diverticulae are noted, most evident in the descending colon. Mural thickening in the region of the cecum, concerning for focal area of colitis, or potentially right-sided diverticulitis. The possibility of underlying mass is not excluded. Appendix is not  confidently identified on today's examination. Vascular/Lymphatic: Aortic atherosclerosis, without evidence of aneurysm or dissection in the abdominal or pelvic vasculature. No lymphadenopathy noted in the abdomen or pelvis. Reproductive: Uterus and ovaries are unremarkable in appearance. Other: No significant volume of ascites.  No pneumoperitoneum. Musculoskeletal: There are no aggressive appearing lytic or blastic lesions noted in the visualized portions of the skeleton. IMPRESSION: 1. Large multi-cystic lesion in the central aspect of the liver adjacent to the gallbladder fossa, with several smaller satellite lesions. These are all new compared to prior CT the chest, abdomen and pelvis 08/07/2016, concerning for intrahepatic abscesses. 2. Extensive mural thickening and inflammatory changes in the region of the cecum. This is nonspecific, and could reflect either right-sided diverticulitis, focal area of colitis, or potentially even underlying neoplasm. 3. Cholelithiasis. Gallbladder is nearly completely contracted without surrounding inflammatory changes to suggest an acute cholecystitis at this time. 4. Left-sided nephrolithiasis measuring up to 8 mm in the upper pole collecting system of left kidney. No ureteral stones or findings of urinary tract obstruction. 5. Aortic atherosclerosis. Electronically Signed   By: Vinnie Langton M.D.   On: 02/23/2018 21:23   US Biopsy (liver)  Result Date: 02/27/2018 INDICATION: 72 year old with an indeterminate liver lesion. Concern for mass versus abscess. EXAM: ULTRASOUND-GUIDED LIVER LESION BIOPSY MEDICATIONS: None. ANESTHESIA/SEDATION: Moderate (conscious) sedation was employed during this procedure. A total of Versed 0.5 mg and Fentanyl 25 mcg was administered intravenously. Moderate Sedation Time: 10 minutes. The patient's level of consciousness and  vital signs were monitored continuously by radiology nursing throughout the procedure under my direct supervision.  FLUOROSCOPY TIME:  None COMPLICATIONS: None immediate. PROCEDURE: Informed written consent was obtained from the patient after a thorough discussion of the procedural risks, benefits and alternatives. All questions were addressed. A timeout was performed prior to the initiation of the procedure. Liver was evaluated with ultrasound. Heterogeneous lesion in the right hepatic lobe adjacent to the gallbladder was identified. The right abdomen was prepped and draped in sterile fashion. Maximal barrier sterile technique was utilized including caps, mask, sterile gowns, sterile gloves, sterile drape, hand hygiene and skin antiseptic. Overlying skin was anesthetized with 1% lidocaine. 17 gauge coaxial needle was directed into this heterogeneous lesion with ultrasound guidance. No fluid could be aspirated from the 17 gauge coaxial needle. Three core biopsies were obtained with an 18 gauge core device. Solid core specimens were obtained and placed in formalin. 17 gauge needle was removed without complication. Bandage placed over the puncture site. FINDINGS: Heterogeneous solid lesion in the right hepatic lobe. No fluid could be aspirated from this lesion. Biopsy needle confirmed within the lesion. IMPRESSION: Ultrasound-guided core biopsies of the right hepatic mass. Electronically Signed   By: Markus Daft M.D.   On: 02/27/2018 15:22   Ir Abdomen US Limited  Result Date: 02/26/2018 CLINICAL DATA:  71 year old with an indeterminate liver lesion. EXAM: ULTRASOUND ABDOMEN LIMITED COMPARISON:  CT 02/25/2018 FINDINGS: Liver was evaluated with ultrasound. At the area of concern, there is a heterogeneous hypoechoic structure which appears solid rather than cystic. This lesion is adjacent to calcifications associated with a contracted and possibly abnormal gallbladder. Liver lesion is more suggestive for a solid lesion rather cystic collection or abscess. IMPRESSION: Dominant liver lesion is concerning for a neoplastic process.  Plan for ultrasound-guided biopsy of this lesion. Electronically Signed   By: Markus Daft M.D.   On: 02/26/2018 21:48   Ct Angio Abd/pel W/ And/or W/o  Result Date: 02/25/2018 CLINICAL DATA:  Active GI bleeding. EXAM: CTA ABDOMEN AND PELVIS wITHOUT AND WITH CONTRAST TECHNIQUE: Multidetector CT imaging of the abdomen and pelvis was performed using the standard protocol during bolus administration of intravenous contrast. Multiplanar reconstructed images and MIPs were obtained and reviewed to evaluate the vascular anatomy. CONTRAST:  172mL ISOVUE-370 IOPAMIDOL (ISOVUE-370) INJECTION 76% COMPARISON:  CT, 02/23/2018. FINDINGS: VASCULAR Aorta: Normal in caliber. There is atherosclerosis throughout the aorta with no significant stenosis. Celiac: Minor plaque at the origin.  No significant stenosis. SMA: Mild plaque at the origin.  No significant stenosis. Renals: Mild plaque just beyond the origin on the left. Minimal plaque just beyond the origin on the right. No significant stenosis. IMA: Patent without evidence of aneurysm, dissection, vasculitis or significant stenosis. Inflow: Atherosclerotic changes along the common iliac and internal iliac arteries. No significant stenosis. Mild dilation of the right common iliac artery to 14 mm. Proximal Outflow: Mild plaque involving both common femoral arteries without significant stenosis. Veins: Patent. Review of the MIP images confirms the above findings. NON-VASCULAR Lower chest: Lower lobe dependent atelectasis. No convincing pneumonia. No pulmonary edema. Heart is mildly enlarged. Hepatobiliary: Heterogeneous cystic appearing mass in the right lobe is unchanged from the study performed 2 days ago. There are 2 smaller adjacent masses that lie posterior and inferior to the dominant right lobe mass. No other liver lesions. There is mild intrahepatic bile duct dilation in the upper right lobe, segment 7. Gallbladder is mostly collapsed. There are gallstones. No bile duct  dilation. Pancreas:  Unremarkable. No pancreatic ductal dilatation or surrounding inflammatory changes. Spleen: Normal in size without focal abnormality. Adrenals/Urinary Tract: No adrenal masses. Kidneys are normal in size, orientation and position with symmetric enhancement. There are 2 adjacent nonobstructing stones in the left kidney at the midpole. No renal masses. No hydronephrosis. Normal ureters. Bladder is mostly collapsed. It contains contrast enhanced urine. Stomach/Bowel: As noted on the prior CT, there is a cecal mass versus eccentric wall thickening. Hazy inflammation is now noted adjacent to this. There are several diverticula. Findings support right colon diverticulitis. No abscess. No extraluminal air. There is no evidence of arterial extravasation of contrast into the right colon, or elsewhere in the bowel, to indicate an active bleeding source. Right colon is mildly distended. No other areas of wall thickening. No other evidence of a mass. There are additional colonic diverticula with no other inflammatory changes. Stomach and small bowel unremarkable. Normal appendix visualized. Lymphatic: Mildly prominent gastrohepatic ligament lymph nodes, none pathologically enlarged. Reproductive: Uterus and bilateral adnexa are unremarkable. Other: No abdominal wall hernia or abnormality. No abdominopelvic ascites. Musculoskeletal: No fracture or acute finding. No osteoblastic or osteolytic lesions. IMPRESSION: VASCULAR 1. Atherosclerotic changes as described without significant stenosis. 2. No evidence of an active arterial bleeding source in the bowel. NON-VASCULAR 1. Findings are similar to the prior CT. There is eccentric wall thickening versus a possible mass in the cecum. There are associated diverticula and adjacent inflammation. Findings are most likely due to diverticulitis. No extraluminal air or abscess. There is no visualized arterial bleeding, although this is likely the source of GI bleeding. 2.  Right liver lobe masses as described on the prior CT. Differential diagnosis includes liver abscesses. 3. No other acute abnormality in the abdomen and pelvis. 4. There are other colonic diverticula.  No other diverticulitis. 5. Left intrarenal stones.  Gallstones. 6. Aortic atherosclerosis. Electronically Signed   By: Lajean Manes M.D.   On: 02/25/2018 18:43      TODAY-DAY OF DISCHARGE:  Subjective:   Coco Sharpnack today has no headache,no chest abdominal pain,no new weakness tingling or numbness, feels much better wants to go home today.  Objective:   Blood pressure 108/64, pulse 77, temperature 98.2 F (36.8 C), temperature source Oral, resp. rate 16, height 5\' 6"  (1.676 m), weight 63 kg, SpO2 99 %.  Intake/Output Summary (Last 24 hours) at 03/06/2018 1020 Last data filed at 03/05/2018 1834 Gross per 24 hour  Intake -  Output 3000 ml  Net -3000 ml   Filed Weights   03/03/18 0807 03/05/18 1317 03/05/18 1834  Weight: 67.9 kg 66 kg 63 kg    Exam: Awake Alert, Oriented *3, No new F.N deficits, Normal affect [place].AT,PERRAL Supple Neck,No JVD, No cervical lymphadenopathy appriciated.  Symmetrical Chest wall movement, Good air movement bilaterally, CTAB RRR,No Gallops,Rubs or new Murmurs, No Parasternal Heave +ve B.Sounds, Abd Soft, Non tender, No organomegaly appriciated, No rebound -guarding or rigidity. No Cyanosis, Clubbing or edema, No new Rash or bruise   PERTINENT RADIOLOGIC STUDIES: Ct Abdomen Pelvis Wo Contrast  Result Date: 02/23/2018 CLINICAL DATA:  Abdominal pain after dialysis.  Nausea and vomiting. EXAM: CT ABDOMEN AND PELVIS WITHOUT CONTRAST TECHNIQUE: Multidetector CT imaging of the abdomen and pelvis was performed following the standard protocol without IV contrast. COMPARISON:  08/07/2016 FINDINGS: Lower chest: Dependent atelectasis bilaterally. Hepatobiliary: 4.5 x 5.2 cm heterogeneous low-density mass is identified in the inferior liver, at the junction of the  right and left hepatic lobes. This is right at  the gallbladder fossa and contracted gallbladder with known calcified gallstones is seen immediately adjacent. Liver otherwise unremarkable on this noncontrast exam. No intrahepatic or extrahepatic biliary dilation. Pancreas: No focal mass lesion. No dilatation of the main duct. No intraparenchymal cyst. No peripancreatic edema. Spleen: No splenomegaly. No focal mass lesion. Adrenals/Urinary Tract: No adrenal nodule or mass. Right kidney unremarkable. Adjacent nonobstructing 7 mm stones are identified in the interpolar left kidney. Ureters unremarkable. Bladder is decompressed. Stomach/Bowel: Stomach is nondistended. No gastric wall thickening. No evidence of outlet obstruction. Duodenum is normally positioned as is the ligament of Treitz. No small bowel wall thickening. No small bowel dilatation. The appendix is best seen on coronal and sagittal imaging and has normal features. There is edema/inflammation around the cecum with cecal wall thickening evident (59/2). Right-sided diverticuli are noted in the cecum and ascending colon. Diverticular changes are noted in the left colon without evidence of diverticulitis. Vascular/Lymphatic: There is abdominal aortic atherosclerosis without aneurysm. There is no gastrohepatic or hepatoduodenal ligament lymphadenopathy. No intraperitoneal or retroperitoneal lymphadenopathy. No pelvic sidewall lymphadenopathy. Reproductive: The uterus has normal CT imaging appearance. There is no adnexal mass. Other: No intraperitoneal free fluid. Musculoskeletal: No worrisome lytic or sclerotic osseous abnormality. IMPRESSION: 1. Wall thickening in the cecum with pericecal edema/inflammation on a background of right-sided diverticulosis. Imaging features could be related to right-sided diverticulitis. Infectious/inflammatory right colitis also a consideration and right-sided neoplasm cannot be entirely excluded based on this CT scan. 2. Normal  terminal ileum and appendix. 3. 4.5 x 5.2 cm heterogeneous low-density lesion in the inferior liver is new since 08/07/2016. This is immediately contiguous with the gallbladder fossa. Given changes in the right colon, liver abscess would be a consideration. Primary/secondary liver neoplasm could also have this appearance and proximity to the contracted gallbladder raises the question of gallbladder origin. If renal function permits, follow-up CT or MRI with contrast would likely prove helpful to further evaluate. 4.  Aortic Atherosclerois (ICD10-170.0) Electronically Signed   By: Misty Stanley M.D.   On: 02/23/2018 15:26   Nm Gi Blood Loss  Addendum Date: 02/24/2018   ADDENDUM REPORT: 02/24/2018 19:15 ADDENDUM: Study discussed by telephone with Dr. Silverio Decamp on 02/24/2018 at 1904 hours. Electronically Signed   By: Genevie Ann M.D.   On: 02/24/2018 19:15   Result Date: 02/24/2018 CLINICAL DATA:  72 year old female with right upper quadrant pain nausea and vomiting. Onset of dark bloody stools today. EXAM: NUCLEAR MEDICINE GASTROINTESTINAL BLEEDING SCAN TECHNIQUE: Sequential abdominal images were obtained following intravenous administration of Tc-1m labeled red blood cells. RADIOPHARMACEUTICALS:  26.1 mCi Tc-25m pertechnetate in-vitro labeled red cells. COMPARISON:  CT Abdomen and Pelvis 03/05/2018 and earlier. FINDINGS: Physiologic blood pool activity demonstrated throughout the 1st hour of imaging. Toward the end of the 1st hour there is questionable faint left abdominal small bowel type activity (image 53). During the 2nd hour there is definite abnormal radiotracer activity in the left mid abdomen which appears to move through multiple closely looped segments of bowel in that region. Comparison of the large and small bowel anatomy on the coronal CT images yesterday suggests small-bowel position. IMPRESSION: Positive GI bleeding is detected during the 2nd hour of the study in the left abdomen. The morphology  favors a small-bowel bleeding source. Electronically Signed: By: Genevie Ann M.D. On: 02/24/2018 18:40   Ct Abdomen Pelvis W Contrast  Result Date: 03/04/2018 CLINICAL DATA:  Diverticulitis.  Possible colonic mass. EXAM: CT ABDOMEN AND PELVIS WITH CONTRAST TECHNIQUE: Multidetector CT imaging of  the abdomen and pelvis was performed using the standard protocol following bolus administration of intravenous contrast. CONTRAST:  161mL OMNIPAQUE IOHEXOL 300 MG/ML  SOLN COMPARISON:  02/23/2018 FINDINGS: Lower Chest: Mild right basilar atelectasis is increased since previous study. Hepatobiliary: Several tiny gallstones are seen. The gallbladder is contracted. A complex multilocular cystic lesion is seen in the liver adjacent to the gallbladder fossa which measures 4.9 x 5.0 cm and has not significantly changed since previous study. A few other smaller low-attenuation lesions in the inferior right lobe are also stable. No new liver masses are identified. New mild dilatation of intrahepatic bile ducts is seen in the superior right hepatic lobe. Pancreas:  No mass or inflammatory changes. Spleen: Within normal limits in size and appearance. Adrenals/Urinary Tract: No masses identified. No evidence of hydronephrosis. Two adjacent sub-cm calculi are seen in the interpolar region of the left kidney. Stomach/Bowel: Increased colonic wall thickening is now seen involving the cecum, ascending, and transverse portions of the colon, consistent with colitis. There is no evidence of bowel obstruction. No evidence of pneumatosis or free intraperitoneal air. Diverticulosis is seen involving the descending and proximal sigmoid colon, however there is no evidence of diverticulitis in these regions. Narrowing abnormal fluid collections identified. Vascular/Lymphatic: Mild adenopathy is seen in the porta hepatis cysts which is increased since previous study. This measures 2.7 x 2.3 cm and shows on image 27/3. Aortic atherosclerosis. No  abdominal aortic aneurysm. Reproductive:  No mass or other significant abnormality. Other:  None. Musculoskeletal:  No suspicious bone lesions identified. IMPRESSION: Increased colonic wall thickening throughout the cecum, ascending and transverse colon, consistent with colitis. Stable complex cystic lesions in right hepatic lobe, and mild intrahepatic biliary ductal dilatation in the superior right hepatic lobe. These findings are suspicious for hepatic abscesses, although differential diagnosis also includes liver metastases and cholangiocarcinoma. Mild porta hepatis lymphadenopathy, shows mild increase since previous study. Cholelithiasis, without evidence of acute cholecystitis. Electronically Signed   By: Earle Gell M.D.   On: 03/04/2018 20:14   Ct Abdomen Pelvis W Contrast  Result Date: 02/23/2018 CLINICAL DATA:  72 year old female with history of abdominal pain and nausea and vomiting since yesterday. EXAM: CT ABDOMEN AND PELVIS WITH CONTRAST TECHNIQUE: Multidetector CT imaging of the abdomen and pelvis was performed using the standard protocol following bolus administration of intravenous contrast. CONTRAST:  163mL OMNIPAQUE IOHEXOL 300 MG/ML  SOLN COMPARISON:  Noncontrast CT the abdomen and pelvis 02/23/2018. FINDINGS: Lower chest: Dependent areas of scarring and/or atelectasis in the lower lobes of the lungs bilaterally. Hepatobiliary: As noted on the prior study there is a large intermediate attenuation lesion in the central aspect of the liver adjacent to the gallbladder fossa. This is irregular in shape and therefore difficult to accurately measure, but this measures at least 5.2 x 4.4 cm (axial image 32 of series 3) predominantly involving segments 4B and 5. This lesion appears to have some thick internal septations. There is a smaller satellite lesion in the central aspect of segment 5 measuring 1.8 cm (axial image 34 of series 3). In addition, inferior to the lesion there are some dilated bile  ducts, likely related to mass effect from the lesion centrally causing mild obstruction. In the posterior aspect of segment 6 of the liver (axial image 33 of series 3) there is an additional 11 mm lesion which is intermediate attenuation. Calcified gallstones in the gallbladder which appears nearly completely contracted. Pancreas: No pancreatic mass. No pancreatic ductal dilatation. No pancreatic or peripancreatic fluid  or inflammatory changes. Spleen: Unremarkable. Adrenals/Urinary Tract: 2 nonobstructive calculi are noted in the collecting system of left kidney measuring up to 8 mm. Right kidney and bilateral adrenal glands are normal in appearance. No hydroureteronephrosis. Urinary bladder is normal in appearance. Stomach/Bowel: Normal appearance of the stomach. No pathologic dilatation of small bowel or colon. Numerous colonic diverticulae are noted, most evident in the descending colon. Mural thickening in the region of the cecum, concerning for focal area of colitis, or potentially right-sided diverticulitis. The possibility of underlying mass is not excluded. Appendix is not confidently identified on today's examination. Vascular/Lymphatic: Aortic atherosclerosis, without evidence of aneurysm or dissection in the abdominal or pelvic vasculature. No lymphadenopathy noted in the abdomen or pelvis. Reproductive: Uterus and ovaries are unremarkable in appearance. Other: No significant volume of ascites.  No pneumoperitoneum. Musculoskeletal: There are no aggressive appearing lytic or blastic lesions noted in the visualized portions of the skeleton. IMPRESSION: 1. Large multi-cystic lesion in the central aspect of the liver adjacent to the gallbladder fossa, with several smaller satellite lesions. These are all new compared to prior CT the chest, abdomen and pelvis 08/07/2016, concerning for intrahepatic abscesses. 2. Extensive mural thickening and inflammatory changes in the region of the cecum. This is  nonspecific, and could reflect either right-sided diverticulitis, focal area of colitis, or potentially even underlying neoplasm. 3. Cholelithiasis. Gallbladder is nearly completely contracted without surrounding inflammatory changes to suggest an acute cholecystitis at this time. 4. Left-sided nephrolithiasis measuring up to 8 mm in the upper pole collecting system of left kidney. No ureteral stones or findings of urinary tract obstruction. 5. Aortic atherosclerosis. Electronically Signed   By: Vinnie Langton M.D.   On: 02/23/2018 21:23   US Biopsy (liver)  Result Date: 02/27/2018 INDICATION: 72 year old with an indeterminate liver lesion. Concern for mass versus abscess. EXAM: ULTRASOUND-GUIDED LIVER LESION BIOPSY MEDICATIONS: None. ANESTHESIA/SEDATION: Moderate (conscious) sedation was employed during this procedure. A total of Versed 0.5 mg and Fentanyl 25 mcg was administered intravenously. Moderate Sedation Time: 10 minutes. The patient's level of consciousness and vital signs were monitored continuously by radiology nursing throughout the procedure under my direct supervision. FLUOROSCOPY TIME:  None COMPLICATIONS: None immediate. PROCEDURE: Informed written consent was obtained from the patient after a thorough discussion of the procedural risks, benefits and alternatives. All questions were addressed. A timeout was performed prior to the initiation of the procedure. Liver was evaluated with ultrasound. Heterogeneous lesion in the right hepatic lobe adjacent to the gallbladder was identified. The right abdomen was prepped and draped in sterile fashion. Maximal barrier sterile technique was utilized including caps, mask, sterile gowns, sterile gloves, sterile drape, hand hygiene and skin antiseptic. Overlying skin was anesthetized with 1% lidocaine. 17 gauge coaxial needle was directed into this heterogeneous lesion with ultrasound guidance. No fluid could be aspirated from the 17 gauge coaxial needle.  Three core biopsies were obtained with an 18 gauge core device. Solid core specimens were obtained and placed in formalin. 17 gauge needle was removed without complication. Bandage placed over the puncture site. FINDINGS: Heterogeneous solid lesion in the right hepatic lobe. No fluid could be aspirated from this lesion. Biopsy needle confirmed within the lesion. IMPRESSION: Ultrasound-guided core biopsies of the right hepatic mass. Electronically Signed   By: Markus Daft M.D.   On: 02/27/2018 15:22   Ir Abdomen US Limited  Result Date: 02/26/2018 CLINICAL DATA:  72 year old with an indeterminate liver lesion. EXAM: ULTRASOUND ABDOMEN LIMITED COMPARISON:  CT 02/25/2018 FINDINGS: Liver was  evaluated with ultrasound. At the area of concern, there is a heterogeneous hypoechoic structure which appears solid rather than cystic. This lesion is adjacent to calcifications associated with a contracted and possibly abnormal gallbladder. Liver lesion is more suggestive for a solid lesion rather cystic collection or abscess. IMPRESSION: Dominant liver lesion is concerning for a neoplastic process. Plan for ultrasound-guided biopsy of this lesion. Electronically Signed   By: Markus Daft M.D.   On: 02/26/2018 21:48   Ct Angio Abd/pel W/ And/or W/o  Result Date: 02/25/2018 CLINICAL DATA:  Active GI bleeding. EXAM: CTA ABDOMEN AND PELVIS wITHOUT AND WITH CONTRAST TECHNIQUE: Multidetector CT imaging of the abdomen and pelvis was performed using the standard protocol during bolus administration of intravenous contrast. Multiplanar reconstructed images and MIPs were obtained and reviewed to evaluate the vascular anatomy. CONTRAST:  121mL ISOVUE-370 IOPAMIDOL (ISOVUE-370) INJECTION 76% COMPARISON:  CT, 02/23/2018. FINDINGS: VASCULAR Aorta: Normal in caliber. There is atherosclerosis throughout the aorta with no significant stenosis. Celiac: Minor plaque at the origin.  No significant stenosis. SMA: Mild plaque at the origin.  No  significant stenosis. Renals: Mild plaque just beyond the origin on the left. Minimal plaque just beyond the origin on the right. No significant stenosis. IMA: Patent without evidence of aneurysm, dissection, vasculitis or significant stenosis. Inflow: Atherosclerotic changes along the common iliac and internal iliac arteries. No significant stenosis. Mild dilation of the right common iliac artery to 14 mm. Proximal Outflow: Mild plaque involving both common femoral arteries without significant stenosis. Veins: Patent. Review of the MIP images confirms the above findings. NON-VASCULAR Lower chest: Lower lobe dependent atelectasis. No convincing pneumonia. No pulmonary edema. Heart is mildly enlarged. Hepatobiliary: Heterogeneous cystic appearing mass in the right lobe is unchanged from the study performed 2 days ago. There are 2 smaller adjacent masses that lie posterior and inferior to the dominant right lobe mass. No other liver lesions. There is mild intrahepatic bile duct dilation in the upper right lobe, segment 7. Gallbladder is mostly collapsed. There are gallstones. No bile duct dilation. Pancreas: Unremarkable. No pancreatic ductal dilatation or surrounding inflammatory changes. Spleen: Normal in size without focal abnormality. Adrenals/Urinary Tract: No adrenal masses. Kidneys are normal in size, orientation and position with symmetric enhancement. There are 2 adjacent nonobstructing stones in the left kidney at the midpole. No renal masses. No hydronephrosis. Normal ureters. Bladder is mostly collapsed. It contains contrast enhanced urine. Stomach/Bowel: As noted on the prior CT, there is a cecal mass versus eccentric wall thickening. Hazy inflammation is now noted adjacent to this. There are several diverticula. Findings support right colon diverticulitis. No abscess. No extraluminal air. There is no evidence of arterial extravasation of contrast into the right colon, or elsewhere in the bowel, to  indicate an active bleeding source. Right colon is mildly distended. No other areas of wall thickening. No other evidence of a mass. There are additional colonic diverticula with no other inflammatory changes. Stomach and small bowel unremarkable. Normal appendix visualized. Lymphatic: Mildly prominent gastrohepatic ligament lymph nodes, none pathologically enlarged. Reproductive: Uterus and bilateral adnexa are unremarkable. Other: No abdominal wall hernia or abnormality. No abdominopelvic ascites. Musculoskeletal: No fracture or acute finding. No osteoblastic or osteolytic lesions. IMPRESSION: VASCULAR 1. Atherosclerotic changes as described without significant stenosis. 2. No evidence of an active arterial bleeding source in the bowel. NON-VASCULAR 1. Findings are similar to the prior CT. There is eccentric wall thickening versus a possible mass in the cecum. There are associated diverticula and adjacent inflammation.  Findings are most likely due to diverticulitis. No extraluminal air or abscess. There is no visualized arterial bleeding, although this is likely the source of GI bleeding. 2. Right liver lobe masses as described on the prior CT. Differential diagnosis includes liver abscesses. 3. No other acute abnormality in the abdomen and pelvis. 4. There are other colonic diverticula.  No other diverticulitis. 5. Left intrarenal stones.  Gallstones. 6. Aortic atherosclerosis. Electronically Signed   By: Lajean Manes M.D.   On: 02/25/2018 18:43     PERTINENT LAB RESULTS: CBC: Recent Labs    03/05/18 0310  WBC 13.5*  HGB 10.0*  HCT 31.6*  PLT 584*   CMET CMP     Component Value Date/Time   NA 133 (L) 03/05/2018 0310   K 3.7 03/05/2018 0310   CL 97 (L) 03/05/2018 0310   CO2 23 03/05/2018 0310   GLUCOSE 78 03/05/2018 0310   BUN 12 03/05/2018 0310   CREATININE 6.32 (H) 03/05/2018 0310   CALCIUM 8.0 (L) 03/05/2018 0310   PROT 6.4 (L) 03/05/2018 0310   ALBUMIN 2.0 (L) 03/05/2018 0310    ALBUMIN 2.0 (L) 03/05/2018 0310   AST 25 03/05/2018 0310   ALT 21 03/05/2018 0310   ALKPHOS 112 03/05/2018 0310   BILITOT 0.8 03/05/2018 0310   GFRNONAA 6 (L) 03/05/2018 0310   GFRAA 7 (L) 03/05/2018 0310    GFR Estimated Creatinine Clearance: 7.6 mL/min (A) (by C-G formula based on SCr of 6.32 mg/dL (H)). No results for input(s): LIPASE, AMYLASE in the last 72 hours. No results for input(s): CKTOTAL, CKMB, CKMBINDEX, TROPONINI in the last 72 hours. Invalid input(s): POCBNP No results for input(s): DDIMER in the last 72 hours. No results for input(s): HGBA1C in the last 72 hours. No results for input(s): CHOL, HDL, LDLCALC, TRIG, CHOLHDL, LDLDIRECT in the last 72 hours. No results for input(s): TSH, T4TOTAL, T3FREE, THYROIDAB in the last 72 hours.  Invalid input(s): FREET3 No results for input(s): VITAMINB12, FOLATE, FERRITIN, TIBC, IRON, RETICCTPCT in the last 72 hours. Coags: No results for input(s): INR in the last 72 hours.  Invalid input(s): PT Microbiology: Recent Results (from the past 240 hour(s))  Culture, blood (routine x 2)     Status: None   Collection Time: 02/28/18  9:33 AM  Result Value Ref Range Status   Specimen Description BLOOD BLOOD LEFT HAND  Final   Special Requests   Final    BOTTLES DRAWN AEROBIC ONLY Blood Culture results may not be optimal due to an inadequate volume of blood received in culture bottles   Culture   Final    NO GROWTH 5 DAYS Performed at [facility], [place] [address], [place], [place] [address]    Report Status 03/05/2018 FINAL  Final  Culture, blood (routine x 2)     Status: None   Collection Time: 02/28/18  9:42 AM  Result Value Ref Range Status   Specimen Description BLOOD BLOOD RIGHT HAND  Final   Special Requests   Final    BOTTLES DRAWN AEROBIC ONLY Blood Culture adequate volume   Culture   Final    NO GROWTH 5 DAYS Performed at [facility], [place] [address], [place], [place] [address]    Report Status  03/05/2018 FINAL  Final    FURTHER DISCHARGE INSTRUCTIONS:  Get Medicines reviewed and adjusted: Please take all your medications with you for your next visit with your Primary MD  Laboratory/radiological data: Please request your Primary  MD to go over all hospital tests and procedure/radiological results at the follow up, please ask your Primary MD to get all Hospital records sent to his/her office.  In some cases, they will be blood work, cultures and biopsy results pending at the time of your discharge. Please request that your primary care M.D. goes through all the records of your hospital data and follows up on these results.  Also Note the following: If you experience worsening of your admission symptoms, develop shortness of breath, life threatening emergency, suicidal or homicidal thoughts you must seek medical attention immediately by calling 911 or calling your MD immediately  if symptoms less severe.  You must read complete instructions/literature along with all the possible adverse reactions/side effects for all the Medicines you take and that have been prescribed to you. Take any new Medicines after you have completely understood and accpet all the possible adverse reactions/side effects.   Do not drive when taking Pain medications or sleeping medications (Benzodaizepines)  Do not take more than prescribed Pain, Sleep and Anxiety Medications. It is not advisable to combine anxiety,sleep and pain medications without talking with your primary care practitioner  Special Instructions: If you have smoked or chewed Tobacco  in the last 2 yrs please stop smoking, stop any regular Alcohol  and or any Recreational drug use.  Wear Seat belts while driving.  Please note: You were cared for by a hospitalist during your hospital stay. Once you are discharged, your primary care physician will handle any further medical issues. Please note that NO REFILLS for any discharge medications will  be authorized once you are discharged, as it is imperative that you return to your primary care physician (or establish a relationship with a primary care physician if you do not have one) for your post hospital discharge needs so that they can reassess your need for medications and monitor your lab values.  Total Time spent coordinating discharge including counseling, education and face to face time equals 35 minutes.  Signed: Ashlinn Hemrick 03/06/2018 10:20 AM

## 2018-03-06 NOTE — Progress Notes (Signed)
1 Day Post-Op    [id number] sided abdominal pain  Subjective: She is up walking in the hall, pain is much better. Someone has mentioned going home and coming back after the pathology is back.  She is the best she has looked since admit.  Objective: Vital signs in last 24 hours: Temp:  [98.1 F (36.7 C)-98.6 F (37 C)] 98.2 F (36.8 C) (10/19 0532) Pulse Rate:  [68-106] 77 (10/19 0532) Resp:  [13-18] 16 (10/19 0532) BP: (100-153)/(56-86) 108/64 (10/19 0532) SpO2:  [95 %-100 %] 99 % (10/19 0532) Weight:  [63 kg-66 kg] 63 kg (10/18 1834) Last BM Date: 03/05/18 I/O ? Afebrile, VSS No labs today  Intake/Output from previous day: 10/18 0701 - 10/19 0700 In: 400 [I.V.:400] Out: 3005 [Blood:5] Intake/Output this shift: No intake/output data recorded.  General appearance: alert, cooperative and no distress GI: soft, not tender and walking without issues, having BM's per pt.  Lab Results:  Recent Labs    03/05/18 0310  WBC 13.5*  HGB 10.0*  HCT 31.6*  PLT 584*    BMET Recent Labs    03/05/18 0310  NA 133*  K 3.7  CL 97*  CO2 23  GLUCOSE 78  BUN 12  CREATININE 6.32*  CALCIUM 8.0*   PT/INR No results for input(s): LABPROT, INR in the last 72 hours.  Recent Labs  Lab 03/05/18 0310  AST 25  ALT 21  ALKPHOS 112  BILITOT 0.8  PROT 6.4*  ALBUMIN 2.0*  2.0*     Lipase     Component Value Date/Time   LIPASE 35 02/23/2018 1224     Medications: . Chlorhexidine Gluconate Cloth  6 each Topical Q0600  . mouth rinse  15 mL Mouth Rinse BID  . metoprolol tartrate  25 mg Oral BID  . multivitamin  1 tablet Oral QHS  . nicotine  14 mg Transdermal Q2000  . pantoprazole  40 mg Oral BID    Assessment/Plan  End-stage renal disease on dialysisMWF Hypertension Ongoing tobacco use Hyperlipidemia  Right lower quadrant pain GI bleed Possible diverticulitisvs colitis vs neoplasm of the cecum Liver mass  -CTA negative10/60for a sourceof bleed - tagged  RBC scan shows bleeding in L abdomen, likely SB source -s/pIR biopsy of liver lesion10/12, path shows adenocarcinoma -GI following- s/p colonoscopy and endoscopy this AM, biopsies taken and path pending - colonoscopy showed severe diverticulosis but no diverticular bleeding and ulcerated lesion in cecum concerning for malignancy - Pathology from colonoscopy still pending - Patient clinically stable withsporadicRLQ tenderness, no peritonitis. WBC decreased this AM.   ID -zosyn 10/8>10/17 FEN -IVF,full liquids VTE -SCDs, no chemical DVT prophylaxis due to bleeding Foley -none  Plan:  Will discuss with our MD's, await path.  She is on full liquids.      LOS: 11 days    Brodie Correll 03/06/2018 [phone]

## 2018-03-08 ENCOUNTER — Encounter (HOSPITAL_COMMUNITY): Payer: Self-pay | Admitting: Gastroenterology

## 2018-03-09 ENCOUNTER — Telehealth: Payer: Self-pay | Admitting: Gastroenterology

## 2018-03-09 ENCOUNTER — Encounter: Payer: Self-pay | Admitting: Gastroenterology

## 2018-03-09 ENCOUNTER — Telehealth: Payer: Self-pay | Admitting: Hematology

## 2018-03-09 NOTE — Telephone Encounter (Signed)
We have received the results of the EGD and colonoscopy which were performed for evaluation of etiology of adenocarcinoma of unclear etiology.  The upper GI tract showed duodenitis and gastritis and the patient is already on PPI therapy.  There were no premalignant cells noted.  The colonoscopy showed evidence of a fibrin no exudate without any premalignant or malignant cells being noted from the cecal ulcer. Thus at this point there still remains question as to the underlying etiology of the patient's adenocarcinoma that was diagnosed based on liver biopsy.  I called and spoke with the patient to update her about the results and she was happy to hear about the results overall.  However, I did discuss that there can be sample air that occurs and/or in the setting of ulcerations that sometimes we only get good ulcer and not something that may be deeper.  With this being said I do think that because of the history it would be reasonable to consider the role of a repeat colonoscopy in a few weeks time to reevaluate the colon and potentially obtain further biopsies if the ulcer remains.  I suspect a colonoscopy in about 3 weeks would be reasonable.  With that being said, the patient has an upcoming appointment with oncology tomorrow 10/23 to discuss next steps in her diagnostic/therapeutic evaluation.  I will relay this note to the referring provider Dr. Hilarie Fredrickson with seen her in consultation as well as to Dr. Burr Medico and oncology consultation tomorrow.  She had on last CT imaging a periportal lymph node which was enlarged but not clear to me that that would necessarily change the clinical status if we are to feel that she has potential metastatic disease process.  Question of cholangiocarcinoma, based on the report dictation from radiology is not clear to me in the setting of having normal liver tests that this is a cholangiocarcinoma either.  I would be happy to entertain a repeat colonoscopy and after she is seen  by oncology if it is determined that any other endoscopic evaluation was to be required to be happy to be a part of that discussion.   Justice Britain, MD [facility] Office # [phone]

## 2018-03-09 NOTE — Progress Notes (Signed)
[place]  [phone] [phone] [place] Note   Patient Care Team: [facility] [place] as PCP - General 03/10/2018  Referring Physician: Dr. Sloan Leiter  CHIEF COMPLAINTS/PURPOSE OF CONSULTATION:  Newly diagnosed adenocarcinoma in liver   HISTORY OF PRESENTING ILLNESS:  Mary Chapman 72 y.o. female with past medical history of end-stage renal disease, on hemodialysis, is here because of newly diagnosed adenocarcinoma in liver. She initially presented to the ER on 02/23/2018 complaining of abdominal pain. CT scan revealed multiple liver lesions, which were biopsied and found to be adenocarcinoma.  She underwent EGD and colonoscopy in the hospital by Dr. Rush Landmark which showed no evidence of malignancy.  She was later discharged on 03/06/2018.  Today, she is here with her daughter and son-in-law.  She currently has 4/10 mid-abdominal pain. She uses Percocet for her abdominal pain. She states that she noticed blood in stool before going to the hospital and diarrhea a few times a day that has is worse compared to her usual frequent defecation that started after hemodialysis. She denies nausea, but her appetite and weight are dropping.  She lost almost 20 Ibs in the last month. She states that she is still capable of carrying out daily activities, but she feels slower than usual. She also feels cold and is covered in a blanket.  She denies family history of cancer.  She is a case of HTN and CKD, on dialysis. She quit drinking almost 40 years ago. She smokes 3-4 cigarettes for 20 years, used nicotine patch, and has quit.  She is retired. Has 2 children.   MEDICAL HISTORY:  Past Medical History:  Diagnosis Date  . Diverticulitis   . Focal segmental glomerulosclerosis    ESRD on MWF HD  . Hypertension   . Tobacco abuse     SURGICAL HISTORY: Past Surgical History:  Procedure Laterality Date  . AV FISTULA PLACEMENT Left 03/18/2017   Procedure: Brachiocephalic ARTERIOVENOUS (AV) FISTULA CREATION left arm;  Surgeon: Elam Dutch, MD;  Location: [facility];  Service: Vascular;  Laterality: Left;  . BIOPSY  03/05/2018   Procedure: BIOPSY;  Surgeon: Rush Landmark Telford Nab., MD;  Location: [facility];  Service: Gastroenterology;;  enteroscopy bx /cytology brushing Greig Castilla bx  . COLONOSCOPY WITH PROPOFOL N/A 03/05/2018   Procedure: COLONOSCOPY WITH PROPOFOL;  Surgeon: Rush Landmark Telford Nab., MD;  Location: [facility];  Service: Gastroenterology;  Laterality: N/A;  Bx, Spot  . ENTEROSCOPY N/A 03/05/2018   Procedure: ENTEROSCOPY;  Surgeon: Rush Landmark Telford Nab., MD;  Location: [facility];  Service: Gastroenterology;  Laterality: N/A;  BX, Brushing, & Spot  . INSERTION OF DIALYSIS CATHETER N/A 03/18/2017   Procedure: INSERTION OF DIALYSIS CATHETER;  Surgeon: Elam Dutch, MD;  Location: [facility];  Service: Vascular;  Laterality: N/A;  . IR GENERIC HISTORICAL  08/15/2016   IR US GUIDE VASC ACCESS RIGHT 08/15/2016 Arne Cleveland, MD [facility]  . IR GENERIC HISTORICAL  08/15/2016   IR FLUORO GUIDE CV LINE RIGHT 08/15/2016 Arne Cleveland, MD [facility]  . SUBMUCOSAL INJECTION  03/05/2018   Procedure: SUBMUCOSAL INJECTION;  Surgeon: Rush Landmark Telford Nab., MD;  Location: [facility];  Service: Gastroenterology;;  spot tatoo  . TUBAL LIGATION      SOCIAL HISTORY: Social History   Socioeconomic History  . Marital status: Widowed    Spouse name: Not on file  . Number of children: Not on file  . Years of education: Not on file  . Highest education level: Not  on file  Occupational History  . Occupation: retired  Scientific laboratory technician  . Financial resource strain: Not on file  . Food insecurity:    Worry: Not on file    Inability: Not on file  . Transportation needs:    Medical: Not on file    Non-medical: Not on file  Tobacco Use  . Smoking status: Current Some Day Smoker    Types: Cigarettes  . Smokeless tobacco:  Never Used  . Tobacco comment: 3 cigs/day  Substance and Sexual Activity  . Alcohol use: No  . Drug use: No  . Sexual activity: Not Currently    Birth control/protection: Post-menopausal  Lifestyle  . Physical activity:    Days per week: Not on file    Minutes per session: Not on file  . Stress: Not on file  Relationships  . Social connections:    Talks on phone: Not on file    Gets together: Not on file    Attends religious service: Not on file    Active member of club or organization: Not on file    Attends meetings of clubs or organizations: Not on file    Relationship status: Not on file  . Intimate partner violence:    Fear of current or ex partner: Not on file    Emotionally abused: Not on file    Physically abused: Not on file    Forced sexual activity: Not on file  Other Topics Concern  . Not on file  Social History Narrative  . Not on file    FAMILY HISTORY: Family History  Problem Relation Age of Onset  . Hypertension Mother   . Diabetes Mother   . Hypertension Father   . Diabetes Father   . Hypertension Sister   . Hypertension Brother     ALLERGIES:  has No Known Allergies.  MEDICATIONS:  Current Outpatient Medications  Medication Sig Dispense Refill  . metoprolol tartrate (LOPRESSOR) 50 MG tablet Take 50 mg by mouth 2 (two) times daily.  5  . multivitamin (RENA-VIT) TABS tablet Take 1 tablet by mouth at bedtime. 30 tablet 3  . nicotine (NICODERM CQ - DOSED IN MG/24 HOURS) 21 mg/24hr patch Place 1 patch (21 mg total) onto the skin daily. 28 patch 0  . Nutritional Supplements (FEEDING SUPPLEMENT, NEPRO CARB STEADY,) LIQD Take 237 mLs by mouth 3 (three) times daily as needed (Supplement). 90 Can 0  . oxyCODONE-acetaminophen (PERCOCET) 5-325 MG tablet Take 1 tablet by mouth every 6 (six) hours as needed for severe pain. 20 tablet 0  . pantoprazole (PROTONIX) 40 MG tablet Take 1 tablet (40 mg total) by mouth daily. 30 tablet 0  . polyethylene glycol  (MIRALAX) packet Take 17 g by mouth daily. 30 each 0  . Vitamin D, Ergocalciferol, (DRISDOL) 50000 units CAPS capsule Take 50,000 Units by mouth once a week.  5   No current facility-administered medications for this visit.     REVIEW OF SYSTEMS:   Constitutional: Denies fevers, chills or abnormal night sweats (+) weight loss  (+) loss of appetite (+) feeling cold Eyes: Denies blurriness of vision, double vision or watery eyes Ears, nose, mouth, throat, and face: Denies mucositis or sore throat Respiratory: Denies cough, dyspnea or wheezes Cardiovascular: Denies palpitation, chest discomfort or lower extremity swelling Gastrointestinal:  Denies nausea, heartburn (+) blood in stool (+) mid-abdominal pain (+) diarrhea/frequent defecation GU: (+) dialysis Skin: Denies abnormal skin rashes Lymphatics: Denies new lymphadenopathy or easy bruising Neurological:Denies numbness, tingling  or new weaknesses Behavioral/Psych: Mood is stable, no new changes  All other systems were reviewed with the patient and are negative.  PHYSICAL EXAMINATION: ECOG PERFORMANCE STATUS: 2 - Symptomatic, <50% confined to bed  Vitals:   03/10/18 1243  BP: (!) 150/74  Pulse: 89  Resp: 18  Temp: 99.2 F (37.3 C)  SpO2: 98%   Filed Weights   03/10/18 1243  Weight: 139 lb (63 kg)    GENERAL:alert, no distress and comfortable (+) wheelchair (+) feeling cold, wrapped in blanket  SKIN: skin color, texture, turgor are normal, no rashes or significant lesions EYES: normal, conjunctiva are pink and non-injected, sclera clear OROPHARYNX:no exudate, no erythema and lips, buccal mucosa, and tongue normal  NECK: supple, thyroid normal size, non-tender, without nodularity LYMPH:  no palpable lymphadenopathy in the cervical, axillary or inguinal LUNGS: clear to auscultation and percussion with normal breathing effort HEART: regular rate & rhythm and no murmurs and no lower extremity edema ABDOMEN:abdomen soft, and  normal bowel sounds (+) midabdominal pain (+) tender liver palpable 3cm below costal margin Musculoskeletal:no cyanosis of digits and no clubbing  PSYCH: alert & oriented x 3 with fluent speech NEURO: no focal motor/sensory deficits  LABORATORY DATA:  I have reviewed the data as listed CBC Latest Ref Rng & Units 03/05/2018 03/03/2018 03/02/2018  WBC 4.0 - 10.5 K/uL 13.5(H) 14.6(H) 16.7(H)  Hemoglobin 12.0 - 15.0 g/dL 10.0(L) 9.6(L) 9.7(L)  Hematocrit 36.0 - 46.0 % 31.6(L) 30.3(L) 30.9(L)  Platelets 150 - 400 K/uL 584(H) 400 337   CMP Latest Ref Rng & Units 03/05/2018 03/02/2018 03/01/2018  Glucose 70 - 99 mg/dL 78 93 107(H)  BUN 8 - 23 mg/dL 12 15 33(H)  Creatinine 0.44 - 1.00 mg/dL 6.32(H) 5.04(H) 8.28(H)  Sodium 135 - 145 mmol/L 133(L) 135 131(L)  Potassium 3.5 - 5.1 mmol/L 3.7 3.7 2.8(L)  Chloride 98 - 111 mmol/L 97(L) 98 96(L)  CO2 22 - 32 mmol/L 23 25 24   Calcium 8.9 - 10.3 mg/dL 8.0(L) 7.5(L) 7.3(L)  Total Protein 6.5 - 8.1 g/dL 6.4(L) - -  Total Bilirubin 0.3 - 1.2 mg/dL 0.8 - -  Alkaline Phos 38 - 126 U/L 112 - -  AST 15 - 41 U/L 25 - -  ALT 0 - 44 U/L 21 - -   Tumor Marker CA 19.9 Pending  Procedures  03/05/2018 Colonoscopy Findings: The digital rectal exam findings include non-thrombosed external hemorrhoids. Pertinent negatives include no palpable rectal lesions. The colon (entire examined portion) revealed grossly excessive looping. Many small and large-mouthed diverticula were found in the recto-sigmoid colon, sigmoid colon, descending colon, transverse colon, hepatic flexure, ascending colon and cecum. There was no evidence of diverticular bleeding. A single (solitary) twenty-five mm ulcer was found in the cecum with associated friable and granular tissue surrounding this region (high concerning for an underlying malignancy). No bleeding was present. Biopsies were taken with a cold forceps for histology to rule out malignancy. A segmental and patchy area of  moderately erythematous mucosa was found in the transverse colon, at the hepatic flexure, in the ascending colon and in the cecum. This was biopsied with a cold forceps for histology to understand acute and chronic inflammation pattern. An area at the hepatic flexure was tattooed with an injection of Spot (carbon black) after a very large region of diverticulosis in the ascending colon (for marking purposes in future should patient be a surgical candidate). Normal mucosa was found in the rectum, in the recto-sigmoid colon, in the sigmoid colon  and in the descending colon. Biopsies were taken with a cold forceps for histology to rule out acute/chronic inflammation pattern. Non-bleeding non-thrombosed external and internal hemorrhoids were found during retroflexion, during perianal exam and during digital exam. The hemorrhoids were Grade I (internal hemorrhoids that do not prolapse).  Impression: - Preparation of the colon was fair. - Non-thrombosed external hemorrhoids found on digital rectal exam. - There was significant looping of the colon. - Severe diverticulosis in the recto-sigmoid colon, in the sigmoid colon, in the descending colon, in the transverse colon, at the hepatic flexure, in the ascending colon and in the cecum. There was no evidence of diverticular bleeding. - A single (solitary) ulcer in the cecum - highly concerning for underlying malignancy. Biopsied. Phlegmonous change is possible, though often in setting of complicated diverticulosis/diverticulitis ulceration would not be as common. - Erythematous mucosa in the transverse colon, at the hepatic flexure, in the ascending colon and in the cecum. Biopsied. - Normal mucosa in the rectum, in the recto-sigmoid colon, in the sigmoid colon and in the descending colon. Biopsied. - Non-bleeding non-thrombosed external and internal hemorrhoids.   03/05/2018 Upper Endoscopy Findings: White nummular lesions were noted  superficially on the entire esophagus. Cells for cytology were obtained by brushing to rule out Candida. The Z-line was regular and was found 39 cm from the incisors. A small hiatal hernia was present. Multiple dispersed, small non-bleeding erosions were found in the gastric antrum. There were no stigmata of recent bleeding. No other gross lesions were noted in the entire examined stomach. This was biopsied with a cold forceps for histology and Helicobacter pylori testing from the antrum/incisura/greater curve/lesser curve. Localized moderate mucosal changes characterized by congestion, granularity and altered texture were found in the duodenal bulb and in the D1/D2 angle. Biopsies were taken with a cold forceps for histology. There were no gross lesions in the second portion of the duodenum, in the third portion of the duodenum and in the fourth portion of the duodenum. There were no gross lesions in the visualized proximal jejunum. Area was tattooed with an injection of Spot (carbon black) to demarcate distal extent of the SBE.  Impression: - White nummular lesions in esophageal mucosa. Cells for cytology obtained. - Z-line regular, 39 cm from the incisors. - Small hiatal hernia. - Non-bleeding erosive gastropathy. No other gross lesions in the stomach. Biopsied for HP and histology. - Mucosal changes in the duodenum. Biopsied for histology. - Normal second portion of the duodenum, third portion of the duodenum and fourth portion of the duodenum. - The examined portion of the proximal jejunum was normal. Tattooed distal extent.   PATHOLOGY  02/27/2018 Surgical Pathology Diagnosis Liver, needle/core biopsy, Right Hepatic Lobe - ADENOCARCINOMA. SEE NOTE. Diagnosis Note Immunohistochemical stains show that the tumor cells are positive for CK7 (diffuse), CDX-2 (patchy), and CK20 (focal). Though this immunophenotype can be seen in a small minority of primary colorectal adenocarcinomas,  it is most consistent with an upper gastrointestinal or pancreaticobiliary primary. Clinical and radiologic correlation is suggested. Dr. Saralyn Pilar has reviewed this case and concurs with the above interpretation. The findings were discussed with Dr. Glyn Ade on 03/04/18. (NDK:gt, 03/04/18)rmation Specimen(s) Obtained: Liver, needle/core biopsy, Right Hepatic Lobe Specimen Clinical Information GI bleeding; concern for cecal lesion and new liver lesions; Neoplasm. (nt) Gross Received in formalin are three cores of gray-white soft tissue which average 1.4 x 0.1 cm, and are divided among two blocks for routine histology. (SSW:ah 03/01/18) Stain(s) used in Diagnosis: The following stain(s)  were used in diagnosing the case: CDX-2, CK-7, CK 20. The control(s) stained appropriately.    RADIOGRAPHIC STUDIES: I have personally reviewed the radiological images as listed and agreed with the findings in the report.  03/04/2018 CT AP w contrast IMPRESSION: Increased colonic wall thickening throughout the cecum, ascending and transverse colon, consistent with colitis.  Stable complex cystic lesions in right hepatic lobe, and mild intrahepatic biliary ductal dilatation in the superior right hepatic lobe. These findings are suspicious for hepatic abscesses, although differential diagnosis also includes liver metastases and cholangiocarcinoma.  Mild porta hepatis lymphadenopathy, shows mild increase since previous study.  Cholelithiasis, without evidence of acute cholecystitis.   02/26/2018 US Abdomen IMPRESSION: Dominant liver lesion is concerning for a neoplastic process. Plan for ultrasound-guided biopsy of this lesion.  02/23/2018 CT AP w contrast IMPRESSION: 1. Large multi-cystic lesion in the central aspect of the liver adjacent to the gallbladder fossa, with several smaller satellite lesions. These are all new compared to prior CT the chest, abdomen and pelvis 08/07/2016,  concerning for intrahepatic abscesses. 2. Extensive mural thickening and inflammatory changes in the region of the cecum. This is nonspecific, and could reflect either right-sided diverticulitis, focal area of colitis, or potentially even underlying neoplasm. 3. Cholelithiasis. Gallbladder is nearly completely contracted without surrounding inflammatory changes to suggest an acute cholecystitis at this time. 4. Left-sided nephrolithiasis measuring up to 8 mm in the upper pole collecting system of left kidney. No ureteral stones or findings of urinary tract obstruction. 5. Aortic atherosclerosis.   02/23/2018 CT AP wo contrast IMPRESSION: 1. Wall thickening in the cecum with pericecal edema/inflammation on a background of right-sided diverticulosis. Imaging features could be related to right-sided diverticulitis. Infectious/inflammatory right colitis also a consideration and right-sided neoplasm cannot be entirely excluded based on this CT scan. 2. Normal terminal ileum and appendix. 3. 4.5 x 5.2 cm heterogeneous low-density lesion in the inferior liver is new since 08/07/2016. This is immediately contiguous with the gallbladder fossa. Given changes in the right colon, liver abscess would be a consideration. Primary/secondary liver neoplasm could also have this appearance and proximity to the contracted gallbladder raises the question of gallbladder origin. If renal function permits, follow-up CT or MRI with contrast would likely prove helpful to further evaluate. 4.  Aortic Atherosclerois (ICD10-170.0)   Ct Abdomen Pelvis Wo Contrast  Result Date: 02/23/2018 CLINICAL DATA:  Abdominal pain after dialysis.  Nausea and vomiting. EXAM: CT ABDOMEN AND PELVIS WITHOUT CONTRAST TECHNIQUE: Multidetector CT imaging of the abdomen and pelvis was performed following the standard protocol without IV contrast. COMPARISON:  08/07/2016 FINDINGS: Lower chest: Dependent atelectasis bilaterally.  Hepatobiliary: 4.5 x 5.2 cm heterogeneous low-density mass is identified in the inferior liver, at the junction of the right and left hepatic lobes. This is right at the gallbladder fossa and contracted gallbladder with known calcified gallstones is seen immediately adjacent. Liver otherwise unremarkable on this noncontrast exam. No intrahepatic or extrahepatic biliary dilation. Pancreas: No focal mass lesion. No dilatation of the main duct. No intraparenchymal cyst. No peripancreatic edema. Spleen: No splenomegaly. No focal mass lesion. Adrenals/Urinary Tract: No adrenal nodule or mass. Right kidney unremarkable. Adjacent nonobstructing 7 mm stones are identified in the interpolar left kidney. Ureters unremarkable. Bladder is decompressed. Stomach/Bowel: Stomach is nondistended. No gastric wall thickening. No evidence of outlet obstruction. Duodenum is normally positioned as is the ligament of Treitz. No small bowel wall thickening. No small bowel dilatation. The appendix is best seen on coronal and sagittal imaging and has  normal features. There is edema/inflammation around the cecum with cecal wall thickening evident (59/2). Right-sided diverticuli are noted in the cecum and ascending colon. Diverticular changes are noted in the left colon without evidence of diverticulitis. Vascular/Lymphatic: There is abdominal aortic atherosclerosis without aneurysm. There is no gastrohepatic or hepatoduodenal ligament lymphadenopathy. No intraperitoneal or retroperitoneal lymphadenopathy. No pelvic sidewall lymphadenopathy. Reproductive: The uterus has normal CT imaging appearance. There is no adnexal mass. Other: No intraperitoneal free fluid. Musculoskeletal: No worrisome lytic or sclerotic osseous abnormality. IMPRESSION: 1. Wall thickening in the cecum with pericecal edema/inflammation on a background of right-sided diverticulosis. Imaging features could be related to right-sided diverticulitis. Infectious/inflammatory  right colitis also a consideration and right-sided neoplasm cannot be entirely excluded based on this CT scan. 2. Normal terminal ileum and appendix. 3. 4.5 x 5.2 cm heterogeneous low-density lesion in the inferior liver is new since 08/07/2016. This is immediately contiguous with the gallbladder fossa. Given changes in the right colon, liver abscess would be a consideration. Primary/secondary liver neoplasm could also have this appearance and proximity to the contracted gallbladder raises the question of gallbladder origin. If renal function permits, follow-up CT or MRI with contrast would likely prove helpful to further evaluate. 4.  Aortic Atherosclerois (ICD10-170.0) Electronically Signed   By: Misty Stanley M.D.   On: 02/23/2018 15:26   Nm Gi Blood Loss  Addendum Date: 02/24/2018   ADDENDUM REPORT: 02/24/2018 19:15 ADDENDUM: Study discussed by telephone with Dr. Silverio Decamp on 02/24/2018 at 1904 hours. Electronically Signed   By: Genevie Ann M.D.   On: 02/24/2018 19:15   Result Date: 02/24/2018 CLINICAL DATA:  72 year old female with right upper quadrant pain nausea and vomiting. Onset of dark bloody stools today. EXAM: NUCLEAR MEDICINE GASTROINTESTINAL BLEEDING SCAN TECHNIQUE: Sequential abdominal images were obtained following intravenous administration of Tc-86m labeled red blood cells. RADIOPHARMACEUTICALS:  26.1 mCi Tc-50m pertechnetate in-vitro labeled red cells. COMPARISON:  CT Abdomen and Pelvis 03/05/2018 and earlier. FINDINGS: Physiologic blood pool activity demonstrated throughout the 1st hour of imaging. Toward the end of the 1st hour there is questionable faint left abdominal small bowel type activity (image 53). During the 2nd hour there is definite abnormal radiotracer activity in the left mid abdomen which appears to move through multiple closely looped segments of bowel in that region. Comparison of the large and small bowel anatomy on the coronal CT images yesterday suggests small-bowel  position. IMPRESSION: Positive GI bleeding is detected during the 2nd hour of the study in the left abdomen. The morphology favors a small-bowel bleeding source. Electronically Signed: By: Genevie Ann M.D. On: 02/24/2018 18:40   Ct Abdomen Pelvis W Contrast  Result Date: 03/04/2018 CLINICAL DATA:  Diverticulitis.  Possible colonic mass. EXAM: CT ABDOMEN AND PELVIS WITH CONTRAST TECHNIQUE: Multidetector CT imaging of the abdomen and pelvis was performed using the standard protocol following bolus administration of intravenous contrast. CONTRAST:  186mL OMNIPAQUE IOHEXOL 300 MG/ML  SOLN COMPARISON:  02/23/2018 FINDINGS: Lower Chest: Mild right basilar atelectasis is increased since previous study. Hepatobiliary: Several tiny gallstones are seen. The gallbladder is contracted. A complex multilocular cystic lesion is seen in the liver adjacent to the gallbladder fossa which measures 4.9 x 5.0 cm and has not significantly changed since previous study. A few other smaller low-attenuation lesions in the inferior right lobe are also stable. No new liver masses are identified. New mild dilatation of intrahepatic bile ducts is seen in the superior right hepatic lobe. Pancreas:  No mass or inflammatory changes. Spleen:  Within normal limits in size and appearance. Adrenals/Urinary Tract: No masses identified. No evidence of hydronephrosis. Two adjacent sub-cm calculi are seen in the interpolar region of the left kidney. Stomach/Bowel: Increased colonic wall thickening is now seen involving the cecum, ascending, and transverse portions of the colon, consistent with colitis. There is no evidence of bowel obstruction. No evidence of pneumatosis or free intraperitoneal air. Diverticulosis is seen involving the descending and proximal sigmoid colon, however there is no evidence of diverticulitis in these regions. Narrowing abnormal fluid collections identified. Vascular/Lymphatic: Mild adenopathy is seen in the porta hepatis cysts  which is increased since previous study. This measures 2.7 x 2.3 cm and shows on image 27/3. Aortic atherosclerosis. No abdominal aortic aneurysm. Reproductive:  No mass or other significant abnormality. Other:  None. Musculoskeletal:  No suspicious bone lesions identified. IMPRESSION: Increased colonic wall thickening throughout the cecum, ascending and transverse colon, consistent with colitis. Stable complex cystic lesions in right hepatic lobe, and mild intrahepatic biliary ductal dilatation in the superior right hepatic lobe. These findings are suspicious for hepatic abscesses, although differential diagnosis also includes liver metastases and cholangiocarcinoma. Mild porta hepatis lymphadenopathy, shows mild increase since previous study. Cholelithiasis, without evidence of acute cholecystitis. Electronically Signed   By: Earle Gell M.D.   On: 03/04/2018 20:14   Ct Abdomen Pelvis W Contrast  Result Date: 02/23/2018 CLINICAL DATA:  72 year old female with history of abdominal pain and nausea and vomiting since yesterday. EXAM: CT ABDOMEN AND PELVIS WITH CONTRAST TECHNIQUE: Multidetector CT imaging of the abdomen and pelvis was performed using the standard protocol following bolus administration of intravenous contrast. CONTRAST:  126mL OMNIPAQUE IOHEXOL 300 MG/ML  SOLN COMPARISON:  Noncontrast CT the abdomen and pelvis 02/23/2018. FINDINGS: Lower chest: Dependent areas of scarring and/or atelectasis in the lower lobes of the lungs bilaterally. Hepatobiliary: As noted on the prior study there is a large intermediate attenuation lesion in the central aspect of the liver adjacent to the gallbladder fossa. This is irregular in shape and therefore difficult to accurately measure, but this measures at least 5.2 x 4.4 cm (axial image 32 of series 3) predominantly involving segments 4B and 5. This lesion appears to have some thick internal septations. There is a smaller satellite lesion in the central aspect of  segment 5 measuring 1.8 cm (axial image 34 of series 3). In addition, inferior to the lesion there are some dilated bile ducts, likely related to mass effect from the lesion centrally causing mild obstruction. In the posterior aspect of segment 6 of the liver (axial image 33 of series 3) there is an additional 11 mm lesion which is intermediate attenuation. Calcified gallstones in the gallbladder which appears nearly completely contracted. Pancreas: No pancreatic mass. No pancreatic ductal dilatation. No pancreatic or peripancreatic fluid or inflammatory changes. Spleen: Unremarkable. Adrenals/Urinary Tract: 2 nonobstructive calculi are noted in the collecting system of left kidney measuring up to 8 mm. Right kidney and bilateral adrenal glands are normal in appearance. No hydroureteronephrosis. Urinary bladder is normal in appearance. Stomach/Bowel: Normal appearance of the stomach. No pathologic dilatation of small bowel or colon. Numerous colonic diverticulae are noted, most evident in the descending colon. Mural thickening in the region of the cecum, concerning for focal area of colitis, or potentially right-sided diverticulitis. The possibility of underlying mass is not excluded. Appendix is not confidently identified on today's examination. Vascular/Lymphatic: Aortic atherosclerosis, without evidence of aneurysm or dissection in the abdominal or pelvic vasculature. No lymphadenopathy noted in the abdomen  or pelvis. Reproductive: Uterus and ovaries are unremarkable in appearance. Other: No significant volume of ascites.  No pneumoperitoneum. Musculoskeletal: There are no aggressive appearing lytic or blastic lesions noted in the visualized portions of the skeleton. IMPRESSION: 1. Large multi-cystic lesion in the central aspect of the liver adjacent to the gallbladder fossa, with several smaller satellite lesions. These are all new compared to prior CT the chest, abdomen and pelvis 08/07/2016, concerning for  intrahepatic abscesses. 2. Extensive mural thickening and inflammatory changes in the region of the cecum. This is nonspecific, and could reflect either right-sided diverticulitis, focal area of colitis, or potentially even underlying neoplasm. 3. Cholelithiasis. Gallbladder is nearly completely contracted without surrounding inflammatory changes to suggest an acute cholecystitis at this time. 4. Left-sided nephrolithiasis measuring up to 8 mm in the upper pole collecting system of left kidney. No ureteral stones or findings of urinary tract obstruction. 5. Aortic atherosclerosis. Electronically Signed   By: Vinnie Langton M.D.   On: 02/23/2018 21:23   US Biopsy (liver)  Result Date: 02/27/2018 INDICATION: 72 year old with an indeterminate liver lesion. Concern for mass versus abscess. EXAM: ULTRASOUND-GUIDED LIVER LESION BIOPSY MEDICATIONS: None. ANESTHESIA/SEDATION: Moderate (conscious) sedation was employed during this procedure. A total of Versed 0.5 mg and Fentanyl 25 mcg was administered intravenously. Moderate Sedation Time: 10 minutes. The patient's level of consciousness and vital signs were monitored continuously by radiology nursing throughout the procedure under my direct supervision. FLUOROSCOPY TIME:  None COMPLICATIONS: None immediate. PROCEDURE: Informed written consent was obtained from the patient after a thorough discussion of the procedural risks, benefits and alternatives. All questions were addressed. A timeout was performed prior to the initiation of the procedure. Liver was evaluated with ultrasound. Heterogeneous lesion in the right hepatic lobe adjacent to the gallbladder was identified. The right abdomen was prepped and draped in sterile fashion. Maximal barrier sterile technique was utilized including caps, mask, sterile gowns, sterile gloves, sterile drape, hand hygiene and skin antiseptic. Overlying skin was anesthetized with 1% lidocaine. 17 gauge coaxial needle was directed  into this heterogeneous lesion with ultrasound guidance. No fluid could be aspirated from the 17 gauge coaxial needle. Three core biopsies were obtained with an 18 gauge core device. Solid core specimens were obtained and placed in formalin. 17 gauge needle was removed without complication. Bandage placed over the puncture site. FINDINGS: Heterogeneous solid lesion in the right hepatic lobe. No fluid could be aspirated from this lesion. Biopsy needle confirmed within the lesion. IMPRESSION: Ultrasound-guided core biopsies of the right hepatic mass. Electronically Signed   By: Markus Daft M.D.   On: 02/27/2018 15:22   Ir Abdomen US Limited  Result Date: 02/26/2018 CLINICAL DATA:  71 year old with an indeterminate liver lesion. EXAM: ULTRASOUND ABDOMEN LIMITED COMPARISON:  CT 02/25/2018 FINDINGS: Liver was evaluated with ultrasound. At the area of concern, there is a heterogeneous hypoechoic structure which appears solid rather than cystic. This lesion is adjacent to calcifications associated with a contracted and possibly abnormal gallbladder. Liver lesion is more suggestive for a solid lesion rather cystic collection or abscess. IMPRESSION: Dominant liver lesion is concerning for a neoplastic process. Plan for ultrasound-guided biopsy of this lesion. Electronically Signed   By: Markus Daft M.D.   On: 02/26/2018 21:48   Ct Angio Abd/pel W/ And/or W/o  Result Date: 02/25/2018 CLINICAL DATA:  Active GI bleeding. EXAM: CTA ABDOMEN AND PELVIS wITHOUT AND WITH CONTRAST TECHNIQUE: Multidetector CT imaging of the abdomen and pelvis was performed using the standard protocol during  bolus administration of intravenous contrast. Multiplanar reconstructed images and MIPs were obtained and reviewed to evaluate the vascular anatomy. CONTRAST:  15mL ISOVUE-370 IOPAMIDOL (ISOVUE-370) INJECTION 76% COMPARISON:  CT, 02/23/2018. FINDINGS: VASCULAR Aorta: Normal in caliber. There is atherosclerosis throughout the aorta with no  significant stenosis. Celiac: Minor plaque at the origin.  No significant stenosis. SMA: Mild plaque at the origin.  No significant stenosis. Renals: Mild plaque just beyond the origin on the left. Minimal plaque just beyond the origin on the right. No significant stenosis. IMA: Patent without evidence of aneurysm, dissection, vasculitis or significant stenosis. Inflow: Atherosclerotic changes along the common iliac and internal iliac arteries. No significant stenosis. Mild dilation of the right common iliac artery to 14 mm. Proximal Outflow: Mild plaque involving both common femoral arteries without significant stenosis. Veins: Patent. Review of the MIP images confirms the above findings. NON-VASCULAR Lower chest: Lower lobe dependent atelectasis. No convincing pneumonia. No pulmonary edema. Heart is mildly enlarged. Hepatobiliary: Heterogeneous cystic appearing mass in the right lobe is unchanged from the study performed 2 days ago. There are 2 smaller adjacent masses that lie posterior and inferior to the dominant right lobe mass. No other liver lesions. There is mild intrahepatic bile duct dilation in the upper right lobe, segment 7. Gallbladder is mostly collapsed. There are gallstones. No bile duct dilation. Pancreas: Unremarkable. No pancreatic ductal dilatation or surrounding inflammatory changes. Spleen: Normal in size without focal abnormality. Adrenals/Urinary Tract: No adrenal masses. Kidneys are normal in size, orientation and position with symmetric enhancement. There are 2 adjacent nonobstructing stones in the left kidney at the midpole. No renal masses. No hydronephrosis. Normal ureters. Bladder is mostly collapsed. It contains contrast enhanced urine. Stomach/Bowel: As noted on the prior CT, there is a cecal mass versus eccentric wall thickening. Hazy inflammation is now noted adjacent to this. There are several diverticula. Findings support right colon diverticulitis. No abscess. No extraluminal  air. There is no evidence of arterial extravasation of contrast into the right colon, or elsewhere in the bowel, to indicate an active bleeding source. Right colon is mildly distended. No other areas of wall thickening. No other evidence of a mass. There are additional colonic diverticula with no other inflammatory changes. Stomach and small bowel unremarkable. Normal appendix visualized. Lymphatic: Mildly prominent gastrohepatic ligament lymph nodes, none pathologically enlarged. Reproductive: Uterus and bilateral adnexa are unremarkable. Other: No abdominal wall hernia or abnormality. No abdominopelvic ascites. Musculoskeletal: No fracture or acute finding. No osteoblastic or osteolytic lesions. IMPRESSION: VASCULAR 1. Atherosclerotic changes as described without significant stenosis. 2. No evidence of an active arterial bleeding source in the bowel. NON-VASCULAR 1. Findings are similar to the prior CT. There is eccentric wall thickening versus a possible mass in the cecum. There are associated diverticula and adjacent inflammation. Findings are most likely due to diverticulitis. No extraluminal air or abscess. There is no visualized arterial bleeding, although this is likely the source of GI bleeding. 2. Right liver lobe masses as described on the prior CT. Differential diagnosis includes liver abscesses. 3. No other acute abnormality in the abdomen and pelvis. 4. There are other colonic diverticula.  No other diverticulitis. 5. Left intrarenal stones.  Gallstones. 6. Aortic atherosclerosis. Electronically Signed   By: Lajean Manes M.D.   On: 02/25/2018 18:43    ASSESSMENT & PLAN:  Arista Kettlewell is a 72 y.o. female with history of HTN, smoking, and ESRD  1. 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 -We discussed her case in our  GI tumor board earlier today, based on the image findings, and biopsy results, negative endoscopy, this is most consistent with cholangiocarcinoma. -She will need CT chest to rule out primary lung cancer with liver metastasis, or metastasis to lung. -I discussed that surgery is the primary treatment for cholangiocarcinoma, but it may be difficult given the central location of her tumor. I will refer her to Dr. Barry Dienes. -we discussed the role of adjuvant chemo for potential carcinoma.  She has end-stage renal disease, on dialysis, not a candidate for Xeloda, we may consider 5-fu infusion.  -f/u in 3 weeks. She knows to call for any concerns.   2. HTN - He is currently on metoprolol - f/u with PCP  3. End Stage Renal Disease -She is being treated with dialysis  -f/u with nephrology  4. Abdominal pain  -she is on percocet as needed for her pain   5. Anemia of chronic disease  -Likely related to her end-stage renal disease, and underlying malignancy -She required blood transfusion on February 26, 2018 -Repeat a CBC, and iron study today, to see if she needs IV iron. -Cardio her to start oral iron  PLAN -f/u in 3 weeks with labs -surgical referral to Dr. Barry Dienes.  -Chest CT next week  No orders of the defined types were placed in this encounter.   All questions were answered. The patient knows to call the clinic with any problems, questions or concerns. I spent 55 minutes counseling the patient face to face. The total time spent in the appointment was 60 minutes and more than 50% was on counseling.  Dierdre Searles Dweik am acting as scribe for Dr. Truitt Merle.  I have reviewed the above documentation for accuracy and completeness, and I agree with the above.      Truitt Merle, MD 03/10/2018

## 2018-03-09 NOTE — Telephone Encounter (Signed)
New referral received from the hospital. Pt has been cld and scheduled to see Dr. Burr Medico on 10/23 at 1pm. Pt aware to arrive 30 minutes early. Letter mailed.

## 2018-03-10 ENCOUNTER — Inpatient Hospital Stay: Payer: Medicare Other

## 2018-03-10 ENCOUNTER — Telehealth: Payer: Self-pay | Admitting: Hematology

## 2018-03-10 ENCOUNTER — Inpatient Hospital Stay: Payer: Medicare Other | Attending: Hematology | Admitting: Hematology

## 2018-03-10 ENCOUNTER — Telehealth: Payer: Self-pay

## 2018-03-10 ENCOUNTER — Encounter: Payer: Self-pay | Admitting: Hematology

## 2018-03-10 VITALS — BP 150/74 | HR 89 | Temp 99.2°F | Resp 18 | Ht 66.0 in | Wt 139.0 lb

## 2018-03-10 DIAGNOSIS — D490 Neoplasm of unspecified behavior of digestive system: Secondary | ICD-10-CM

## 2018-03-10 DIAGNOSIS — D638 Anemia in other chronic diseases classified elsewhere: Secondary | ICD-10-CM | POA: Diagnosis not present

## 2018-03-10 DIAGNOSIS — I120 Hypertensive chronic kidney disease with stage 5 chronic kidney disease or end stage renal disease: Secondary | ICD-10-CM | POA: Insufficient documentation

## 2018-03-10 DIAGNOSIS — K6389 Other specified diseases of intestine: Secondary | ICD-10-CM

## 2018-03-10 DIAGNOSIS — C221 Intrahepatic bile duct carcinoma: Secondary | ICD-10-CM

## 2018-03-10 DIAGNOSIS — Z992 Dependence on renal dialysis: Secondary | ICD-10-CM | POA: Insufficient documentation

## 2018-03-10 DIAGNOSIS — R160 Hepatomegaly, not elsewhere classified: Secondary | ICD-10-CM

## 2018-03-10 DIAGNOSIS — C227 Other specified carcinomas of liver: Secondary | ICD-10-CM

## 2018-03-10 DIAGNOSIS — N186 End stage renal disease: Secondary | ICD-10-CM | POA: Diagnosis not present

## 2018-03-10 LAB — CBC WITH DIFFERENTIAL (CANCER CENTER ONLY)
Abs Immature Granulocytes: 0.11 10*3/uL — ABNORMAL HIGH (ref 0.00–0.07)
BASOS ABS: 0.1 10*3/uL (ref 0.0–0.1)
Basophils Relative: 1 %
Eosinophils Absolute: 0.2 10*3/uL (ref 0.0–0.5)
Eosinophils Relative: 2 %
HEMATOCRIT: 25 % — AB (ref 36.0–46.0)
HEMOGLOBIN: 7.9 g/dL — AB (ref 12.0–15.0)
IMMATURE GRANULOCYTES: 1 %
LYMPHS ABS: 1.6 10*3/uL (ref 0.7–4.0)
LYMPHS PCT: 14 %
MCH: 31 pg (ref 26.0–34.0)
MCHC: 31.6 g/dL (ref 30.0–36.0)
MCV: 98 fL (ref 80.0–100.0)
Monocytes Absolute: 1.2 10*3/uL — ABNORMAL HIGH (ref 0.1–1.0)
Monocytes Relative: 10 %
NEUTROS ABS: 8.8 10*3/uL — AB (ref 1.7–7.7)
NEUTROS PCT: 72 %
NRBC: 0 % (ref 0.0–0.2)
Platelet Count: 588 10*3/uL — ABNORMAL HIGH (ref 150–400)
RBC: 2.55 MIL/uL — ABNORMAL LOW (ref 3.87–5.11)
RDW: 15.8 % — AB (ref 11.5–15.5)
WBC Count: 12.1 10*3/uL — ABNORMAL HIGH (ref 4.0–10.5)

## 2018-03-10 LAB — CMP (CANCER CENTER ONLY)
ALT: 13 U/L (ref 0–44)
ANION GAP: 10 (ref 5–15)
AST: 19 U/L (ref 15–41)
Albumin: 2.1 g/dL — ABNORMAL LOW (ref 3.5–5.0)
Alkaline Phosphatase: 133 U/L — ABNORMAL HIGH (ref 38–126)
BUN: 6 mg/dL — ABNORMAL LOW (ref 8–23)
CHLORIDE: 98 mmol/L (ref 98–111)
CO2: 31 mmol/L (ref 22–32)
Calcium: 8.3 mg/dL — ABNORMAL LOW (ref 8.9–10.3)
Creatinine: 2.94 mg/dL — ABNORMAL HIGH (ref 0.44–1.00)
GFR, EST AFRICAN AMERICAN: 17 mL/min — AB (ref >60)
GFR, Estimated: 15 mL/min — ABNORMAL LOW (ref >60)
Glucose, Bld: 92 mg/dL (ref 70–99)
Potassium: 3.4 mmol/L — ABNORMAL LOW (ref 3.5–5.1)
SODIUM: 139 mmol/L (ref 135–145)
Total Bilirubin: 0.3 mg/dL (ref 0.3–1.2)
Total Protein: 6.6 g/dL (ref 6.5–8.1)

## 2018-03-10 LAB — IRON AND TIBC
IRON: 74 ug/dL (ref 41–142)
Saturation Ratios: 50 % (ref 21–57)
TIBC: 150 ug/dL — AB (ref 236–444)
UIBC: 76 ug/dL

## 2018-03-10 LAB — FERRITIN: FERRITIN: 3108 ng/mL — AB (ref 11–307)

## 2018-03-10 NOTE — Telephone Encounter (Signed)
Dr. Rush Landmark, thanks for your note. I just saw her. We reviewed her scan and liver biopsy in tumor board this morning. We think this is likely cholangiocarcinoma, I will do a CT chest to make sure this is not primary lung with liver mets. I have just referred her to Dr. Barry Dienes. I was not sure why she has colitis, and I think repeating colonoscopy is reasonable, although I do not think this is colon cancer. Thanks   Truitt Merle MD

## 2018-03-10 NOTE — Progress Notes (Signed)
  Oncology Nurse Navigator Documentation  Navigator Location: [facility] (03/10/18 1515) Referral date to RadOnc/MedOnc: 03/09/18 (03/10/18 1515) )Navigator Encounter Type: Initial MedOnc (03/10/18 1515)   Met with Mary Chapman and family. Explained role of nurse navigator.    Contact names and phone numbers were provided for entire [facility] team.  Teach back method was used.  No barriers to care identified at present time.  Will continue to follow as needed Abnormal Finding Date: 02/23/18 (03/10/18 1515) Confirmed Diagnosis Date: 03/05/18 (03/10/18 1515)               Patient Visit Type: MedOnc;Initial (03/10/18 1515) Treatment Phase: Pre-Tx/Tx Discussion (03/10/18 1515) Barriers/Navigation Needs: Coordination of Care (03/10/18 1515)   Interventions: Referrals (03/10/18 1515) Referrals: Other(Surgical Referral) (03/10/18 1515)  Referral placed to [facility] - Dr. Barry Dienes for surgical opinion.    Support Groups/Services: Friends and Family (03/10/18 1515)   Acuity: Level 2 (03/10/18 1515)   Acuity Level 2: Initial guidance, education and coordination as needed;Referrals such as genetics, survivorship (03/10/18 1515)     Time Spent with Patient: 30 (03/10/18 1515)

## 2018-03-10 NOTE — Telephone Encounter (Signed)
Mansouraty, Telford Nab., MD  Timothy Lasso, RN        Please send when able. Place patient on for Colonoscopy with myself or Dr. Hilarie Fredrickson in 2-4 weeks in the [facility] for repeat evaluation of the cecal ulcer and further biopsies. I can do a 730 case or a 100PM case if necessary for patient if we get permission to do so. Thanks.

## 2018-03-10 NOTE — Telephone Encounter (Signed)
The pt has been scheduled for a colon with Dr Hilarie Fredrickson for 11/12 at 3:30 pm and a previsit on 03/23/18 at 11 am.  Pt aware letter also mailed with this information.

## 2018-03-10 NOTE — Telephone Encounter (Signed)
Appts scheduled avs/calendar printed / referral placed through [facility]icient per 10/23 los

## 2018-03-11 ENCOUNTER — Encounter: Payer: Self-pay | Admitting: Hematology

## 2018-03-11 ENCOUNTER — Other Ambulatory Visit: Payer: Self-pay

## 2018-03-11 DIAGNOSIS — C221 Intrahepatic bile duct carcinoma: Secondary | ICD-10-CM | POA: Insufficient documentation

## 2018-03-11 NOTE — Progress Notes (Signed)
Patient scheduled to see Dr. Barry Dienes on 03/25/18.

## 2018-03-11 NOTE — Progress Notes (Signed)
MRI, Liver mass is new, CT scan and surgical referral to Dr. Barry Dienes.

## 2018-03-15 ENCOUNTER — Other Ambulatory Visit: Payer: Self-pay

## 2018-03-15 ENCOUNTER — Telehealth: Payer: Self-pay

## 2018-03-15 DIAGNOSIS — C221 Intrahepatic bile duct carcinoma: Secondary | ICD-10-CM

## 2018-03-15 DIAGNOSIS — R160 Hepatomegaly, not elsewhere classified: Secondary | ICD-10-CM

## 2018-03-15 NOTE — Telephone Encounter (Signed)
Spoke with patient regarding her blood work.

## 2018-03-15 NOTE — Telephone Encounter (Signed)
-----   Message from Truitt Merle, MD sent at 03/14/2018  8:24 PM EDT ----- Please let pt know the lab results, Her hg is 7.9, may need blood transfusion. Iron level good. She checks CBC at dialysis every 2 weeks. Please fax her CBC to her nephologist to see if they want do type and cross on her next blood draw, thanks   Truitt Merle  03/14/2018

## 2018-03-15 NOTE — Progress Notes (Signed)
FMLA for daughter completed and mailed to patient address on file. No fax number provided to fax forms to.

## 2018-03-15 NOTE — Telephone Encounter (Signed)
Spoke with patient regarding lab results, per Dr. Burr Medico her Hbg is 7.9 may need blood transfusion, iron level good. Faxed labs to nephrologist.

## 2018-03-17 ENCOUNTER — Telehealth: Payer: Self-pay

## 2018-03-17 NOTE — Telephone Encounter (Signed)
Meg, I have reviewed the chart including Dr. Donneta Romberg colonoscopy I also know that she has been diagnosed with cholangiocarcinoma and evaluation/treatment decisions are ongoing  I am not convinced that repeating colonoscopy at this time makes sense for her.  I will ask for her oncologist's, Dr. Burr Medico, opinion regarding repeat colonoscopy.  If she feels strongly about repeating colonoscopy we will proceed, otherwise will defer treatment decisions to her.  Thanks [employer]

## 2018-03-17 NOTE — Telephone Encounter (Signed)
Hi Dr. Hilarie Fredrickson,  Would you look over this patient hx and let me know if she needs an office visit or can be a direct colon? Dr. Rush Landmark suggested a repeat colon in his notes on the 10/18 colon.She is scheduled with you at [facility] 11/12. The patient has recently been diagnosed with liver cancer. In addition, her lab work indicates she may need a blood transfusion.  Do you want her to have an OV or go ahead with her colonoscopy?  Thank you, Meg

## 2018-03-17 NOTE — Progress Notes (Signed)
FMLA for daughter, Sherlyn Lick, successfully faxed to [place] at [phone].

## 2018-03-19 NOTE — Telephone Encounter (Signed)
If the cecal ulcer did not appear to be malignant and we are not concerned about biopsy missing the target, I do not strongly feel she needs a repeated colonoscopy. Her biopsy path favor pancreaticobiliary primary and less likely colorectal primary.   Truitt Merle MD

## 2018-03-22 ENCOUNTER — Encounter: Payer: Self-pay | Admitting: Hematology

## 2018-03-22 ENCOUNTER — Telehealth: Payer: Self-pay

## 2018-03-22 NOTE — Telephone Encounter (Signed)
Dr. Hilarie Fredrickson,  As per your last TE as well as the Dr. Ernestina Penna note, I have called and spoken with Ms. Kreitzer telling her that both you and Burr Medico did not see a need for a repeat colon right now in lieu of her current liver cancer dx.   I have cancelled both her pre visit for tomorrow as well as her colon with you 11//12.  She was more than grateful!  Thank you! Meg

## 2018-03-22 NOTE — Telephone Encounter (Signed)
Patient calls stating she has been breaking out in sweats over the weekend.  Denies congestion, no sore throat, no N/V/D, no feeling badly.  Instructed patient to call back if symptoms progress or worsen.

## 2018-03-25 ENCOUNTER — Other Ambulatory Visit: Payer: Self-pay

## 2018-03-25 ENCOUNTER — Other Ambulatory Visit: Payer: Self-pay | Admitting: General Surgery

## 2018-03-25 MED ORDER — PANTOPRAZOLE SODIUM 40 MG PO TBEC: 40.0000 mg | DELAYED_RELEASE_TABLET | Freq: Two times a day (BID) | ORAL | 1 refills | Status: DC

## 2018-03-29 ENCOUNTER — Telehealth: Payer: Self-pay | Admitting: *Deleted

## 2018-03-29 NOTE — Telephone Encounter (Signed)
   [facility] Pre-operative Risk Assessment    Request for surgical clearance:  1. What type of surgery is being performed? PARTIAL HEPATECTOMY.    2. When is this surgery scheduled? TBD    3. Are there any medications that need to be held prior to surgery and how long? PT IS NOT ON ANY ANTICOAG   4. Practice name and name of physician performing surgery? [facility], DR. Stark Klein; ATT: April STATON, CMA   5. What is your office phone and fax number? PH# [phone]; FAX# [phone]   7. Anesthesia type (None, local, MAC, general) ? GENERAL   Mary Chapman 03/29/2018, 12:09 PM  _________________________________________________________________   (provider comments below)

## 2018-03-29 NOTE — Telephone Encounter (Signed)
   Primary Cardiologist: New Chart reviewed as part of pre-operative protocol coverage. Because of Domini Mantione's past medical history and time since last visit, he/she will require a follow-up visit in order to better assess preoperative cardiovascular risk.  Patient will need new provider appointment.   Pre-op covering staff: - Please schedule appointment and call patient to inform them. - Please contact requesting surgeon's office via preferred method (i.e, phone, fax) to inform them of need for appointment prior to surgery.   [place], [place]  03/29/2018, 2:05 PM

## 2018-03-29 NOTE — Telephone Encounter (Signed)
Spoke with April Staton, CMA, @ [facility].  She has already faxed over new pt referral for surgical clearance. Will delete this out of preop as it will be handed at the pts appt.

## 2018-03-30 ENCOUNTER — Encounter: Payer: Medicare Other | Admitting: Internal Medicine

## 2018-04-01 ENCOUNTER — Inpatient Hospital Stay: Payer: Medicare Other | Admitting: Hematology

## 2018-04-01 ENCOUNTER — Inpatient Hospital Stay: Payer: Medicare Other | Attending: Hematology

## 2018-04-01 ENCOUNTER — Ambulatory Visit (HOSPITAL_COMMUNITY)
Admission: RE | Admit: 2018-04-01 | Discharge: 2018-04-01 | Disposition: A | Discharge status: Home / Self Care | Payer: Medicare Other | Source: Ambulatory Visit | Attending: Hematology | Admitting: Hematology

## 2018-04-01 ENCOUNTER — Telehealth: Payer: Self-pay

## 2018-04-01 DIAGNOSIS — N200 Calculus of kidney: Secondary | ICD-10-CM | POA: Insufficient documentation

## 2018-04-01 DIAGNOSIS — D490 Neoplasm of unspecified behavior of digestive system: Secondary | ICD-10-CM | POA: Diagnosis not present

## 2018-04-01 DIAGNOSIS — D638 Anemia in other chronic diseases classified elsewhere: Secondary | ICD-10-CM | POA: Diagnosis not present

## 2018-04-01 DIAGNOSIS — I2510 Atherosclerotic heart disease of native coronary artery without angina pectoris: Secondary | ICD-10-CM | POA: Diagnosis not present

## 2018-04-01 DIAGNOSIS — C227 Other specified carcinomas of liver: Secondary | ICD-10-CM | POA: Diagnosis present

## 2018-04-01 DIAGNOSIS — N186 End stage renal disease: Secondary | ICD-10-CM | POA: Insufficient documentation

## 2018-04-01 DIAGNOSIS — J439 Emphysema, unspecified: Secondary | ICD-10-CM | POA: Diagnosis not present

## 2018-04-01 DIAGNOSIS — I700 Atherosclerosis of aorta: Secondary | ICD-10-CM | POA: Diagnosis not present

## 2018-04-01 DIAGNOSIS — I120 Hypertensive chronic kidney disease with stage 5 chronic kidney disease or end stage renal disease: Secondary | ICD-10-CM | POA: Insufficient documentation

## 2018-04-01 DIAGNOSIS — R911 Solitary pulmonary nodule: Secondary | ICD-10-CM | POA: Diagnosis not present

## 2018-04-01 DIAGNOSIS — K6389 Other specified diseases of intestine: Secondary | ICD-10-CM

## 2018-04-01 DIAGNOSIS — Z992 Dependence on renal dialysis: Secondary | ICD-10-CM | POA: Diagnosis not present

## 2018-04-01 LAB — CBC WITH DIFFERENTIAL (CANCER CENTER ONLY)
Abs Immature Granulocytes: 0.05 10*3/uL (ref 0.00–0.07)
Basophils Absolute: 0 10*3/uL (ref 0.0–0.1)
Basophils Relative: 0 %
EOS PCT: 1 %
Eosinophils Absolute: 0.2 10*3/uL (ref 0.0–0.5)
HCT: 24 % — ABNORMAL LOW (ref 36.0–46.0)
HEMOGLOBIN: 7.2 g/dL — AB (ref 12.0–15.0)
Immature Granulocytes: 0 %
LYMPHS ABS: 1.8 10*3/uL (ref 0.7–4.0)
LYMPHS PCT: 16 %
MCH: 30.4 pg (ref 26.0–34.0)
MCHC: 30 g/dL (ref 30.0–36.0)
MCV: 101.3 fL — ABNORMAL HIGH (ref 80.0–100.0)
MONO ABS: 1.2 10*3/uL — AB (ref 0.1–1.0)
Monocytes Relative: 11 %
Neutro Abs: 8 10*3/uL — ABNORMAL HIGH (ref 1.7–7.7)
Neutrophils Relative %: 72 %
Platelet Count: 393 10*3/uL (ref 150–400)
RBC: 2.37 MIL/uL — ABNORMAL LOW (ref 3.87–5.11)
RDW: 14.6 % (ref 11.5–15.5)
WBC Count: 11.3 10*3/uL — ABNORMAL HIGH (ref 4.0–10.5)
nRBC: 0 % (ref 0.0–0.2)

## 2018-04-01 LAB — CMP (CANCER CENTER ONLY)
ALT: 7 U/L (ref 0–44)
AST: 13 U/L — AB (ref 15–41)
Albumin: 2.1 g/dL — ABNORMAL LOW (ref 3.5–5.0)
Alkaline Phosphatase: 135 U/L — ABNORMAL HIGH (ref 38–126)
Anion gap: 11 (ref 5–15)
BUN: 18 mg/dL (ref 8–23)
CHLORIDE: 99 mmol/L (ref 98–111)
CO2: 31 mmol/L (ref 22–32)
CREATININE: 5.35 mg/dL — AB (ref 0.44–1.00)
Calcium: 8.9 mg/dL (ref 8.9–10.3)
GFR, Est AFR Am: 8 mL/min — ABNORMAL LOW (ref >60)
GFR, Estimated: 7 mL/min — ABNORMAL LOW (ref >60)
GLUCOSE: 119 mg/dL — AB (ref 70–99)
Potassium: 4.1 mmol/L (ref 3.5–5.1)
SODIUM: 141 mmol/L (ref 135–145)
Total Bilirubin: 0.3 mg/dL (ref 0.3–1.2)
Total Protein: 7.1 g/dL (ref 6.5–8.1)

## 2018-04-01 LAB — IRON AND TIBC
IRON: 32 ug/dL — AB (ref 41–142)
Saturation Ratios: 17 % — ABNORMAL LOW (ref 21–57)
TIBC: 183 ug/dL — ABNORMAL LOW (ref 236–444)
UIBC: 151 ug/dL (ref 120–384)

## 2018-04-01 LAB — TYPE AND SCREEN
ABO/RH(D): O POS
Antibody Screen: NEGATIVE

## 2018-04-01 LAB — FERRITIN: FERRITIN: 2596 ng/mL — AB (ref 11–307)

## 2018-04-01 NOTE — Telephone Encounter (Signed)
Spoke with patient concerning f/u with Dr. Burr Medico today, this was to discuss CT scan results but not having until 3 today.  Patient is in agreement to cancel this f/u and Dr. Burr Medico will call her with the results.

## 2018-04-02 LAB — ABO/RH: ABO/RH(D): O POS

## 2018-04-05 ENCOUNTER — Telehealth: Payer: Self-pay | Admitting: Hematology

## 2018-04-05 NOTE — Telephone Encounter (Signed)
I called patient, and discussed her CT chest scan findings, including the incidental right lung nodule, kidney stone, emphysema, and atherosclerosis.  No concern for metastatic disease.  She is going to see cardiologist soon for preop clearance, Dr. Barry Dienes offered her surgical resection for pancreatic cancer.  I plan to see her back after her surgery.  She knows to call me if she has any concerns.  She appreciated my call.  Truitt Merle  04/05/2018

## 2018-04-09 ENCOUNTER — Inpatient Hospital Stay (HOSPITAL_BASED_OUTPATIENT_CLINIC_OR_DEPARTMENT_OTHER)
Admission: EM | Admit: 2018-04-09 | Discharge: 2018-04-16 | DRG: 871 | Disposition: A | Discharge status: Other Acute Inpatient Hospital | Payer: Medicare Other | Attending: Internal Medicine | Admitting: Internal Medicine

## 2018-04-09 ENCOUNTER — Other Ambulatory Visit: Payer: Self-pay

## 2018-04-09 ENCOUNTER — Emergency Department (HOSPITAL_BASED_OUTPATIENT_CLINIC_OR_DEPARTMENT_OTHER): Payer: Medicare Other

## 2018-04-09 ENCOUNTER — Encounter (HOSPITAL_BASED_OUTPATIENT_CLINIC_OR_DEPARTMENT_OTHER): Payer: Self-pay | Admitting: Emergency Medicine

## 2018-04-09 DIAGNOSIS — I10 Essential (primary) hypertension: Secondary | ICD-10-CM

## 2018-04-09 DIAGNOSIS — R601 Generalized edema: Secondary | ICD-10-CM | POA: Diagnosis present

## 2018-04-09 DIAGNOSIS — R112 Nausea with vomiting, unspecified: Secondary | ICD-10-CM | POA: Diagnosis present

## 2018-04-09 DIAGNOSIS — C221 Intrahepatic bile duct carcinoma: Secondary | ICD-10-CM | POA: Diagnosis present

## 2018-04-09 DIAGNOSIS — Z992 Dependence on renal dialysis: Secondary | ICD-10-CM

## 2018-04-09 DIAGNOSIS — F17210 Nicotine dependence, cigarettes, uncomplicated: Secondary | ICD-10-CM | POA: Diagnosis present

## 2018-04-09 DIAGNOSIS — R7881 Bacteremia: Secondary | ICD-10-CM | POA: Diagnosis not present

## 2018-04-09 DIAGNOSIS — A414 Sepsis due to anaerobes: Secondary | ICD-10-CM | POA: Diagnosis present

## 2018-04-09 DIAGNOSIS — B966 Bacteroides fragilis [B. fragilis] as the cause of diseases classified elsewhere: Secondary | ICD-10-CM | POA: Diagnosis present

## 2018-04-09 DIAGNOSIS — R05 Cough: Secondary | ICD-10-CM | POA: Diagnosis present

## 2018-04-09 DIAGNOSIS — D631 Anemia in chronic kidney disease: Secondary | ICD-10-CM | POA: Diagnosis present

## 2018-04-09 DIAGNOSIS — N2581 Secondary hyperparathyroidism of renal origin: Secondary | ICD-10-CM | POA: Diagnosis present

## 2018-04-09 DIAGNOSIS — E611 Iron deficiency: Secondary | ICD-10-CM | POA: Diagnosis present

## 2018-04-09 DIAGNOSIS — E440 Moderate protein-calorie malnutrition: Secondary | ICD-10-CM | POA: Diagnosis present

## 2018-04-09 DIAGNOSIS — R1084 Generalized abdominal pain: Secondary | ICD-10-CM | POA: Diagnosis present

## 2018-04-09 DIAGNOSIS — R079 Chest pain, unspecified: Secondary | ICD-10-CM | POA: Diagnosis not present

## 2018-04-09 DIAGNOSIS — Z8249 Family history of ischemic heart disease and other diseases of the circulatory system: Secondary | ICD-10-CM | POA: Diagnosis not present

## 2018-04-09 DIAGNOSIS — A419 Sepsis, unspecified organism: Secondary | ICD-10-CM | POA: Diagnosis not present

## 2018-04-09 DIAGNOSIS — Z720 Tobacco use: Secondary | ICD-10-CM | POA: Diagnosis not present

## 2018-04-09 DIAGNOSIS — Z833 Family history of diabetes mellitus: Secondary | ICD-10-CM

## 2018-04-09 DIAGNOSIS — Z01810 Encounter for preprocedural cardiovascular examination: Secondary | ICD-10-CM | POA: Diagnosis not present

## 2018-04-09 DIAGNOSIS — N189 Chronic kidney disease, unspecified: Secondary | ICD-10-CM | POA: Diagnosis not present

## 2018-04-09 DIAGNOSIS — I120 Hypertensive chronic kidney disease with stage 5 chronic kidney disease or end stage renal disease: Secondary | ICD-10-CM | POA: Diagnosis present

## 2018-04-09 DIAGNOSIS — N269 Renal sclerosis, unspecified: Secondary | ICD-10-CM | POA: Diagnosis present

## 2018-04-09 DIAGNOSIS — G8929 Other chronic pain: Secondary | ICD-10-CM | POA: Diagnosis present

## 2018-04-09 DIAGNOSIS — Z01818 Encounter for other preprocedural examination: Secondary | ICD-10-CM | POA: Diagnosis not present

## 2018-04-09 DIAGNOSIS — M898X9 Other specified disorders of bone, unspecified site: Secondary | ICD-10-CM | POA: Diagnosis present

## 2018-04-09 DIAGNOSIS — N186 End stage renal disease: Secondary | ICD-10-CM | POA: Diagnosis present

## 2018-04-09 DIAGNOSIS — R059 Cough, unspecified: Secondary | ICD-10-CM

## 2018-04-09 LAB — INFLUENZA PANEL BY PCR (TYPE A & B)
Influenza A By PCR: NEGATIVE
Influenza B By PCR: NEGATIVE

## 2018-04-09 LAB — I-STAT CG4 LACTIC ACID, ED
Lactic Acid, Venous: 6.38 mmol/L (ref 0.5–1.9)
Lactic Acid, Venous: 1.36 mmol/L (ref 0.5–1.9)

## 2018-04-09 LAB — CBC WITH DIFFERENTIAL/PLATELET
Abs Immature Granulocytes: 0.04 10*3/uL (ref 0.00–0.07)
BASOS ABS: 0 10*3/uL (ref 0.0–0.1)
Basophils Relative: 0 %
EOS ABS: 0 10*3/uL (ref 0.0–0.5)
EOS PCT: 0 %
HEMATOCRIT: 26.5 % — AB (ref 36.0–46.0)
HEMOGLOBIN: 8 g/dL — AB (ref 12.0–15.0)
Immature Granulocytes: 0 %
LYMPHS PCT: 5 %
Lymphs Abs: 0.6 10*3/uL — ABNORMAL LOW (ref 0.7–4.0)
MCH: 29.9 pg (ref 26.0–34.0)
MCHC: 30.2 g/dL (ref 30.0–36.0)
MCV: 98.9 fL (ref 80.0–100.0)
MONO ABS: 0.1 10*3/uL (ref 0.1–1.0)
Monocytes Relative: 1 %
NRBC: 0 % (ref 0.0–0.2)
Neutro Abs: 11.2 10*3/uL — ABNORMAL HIGH (ref 1.7–7.7)
Neutrophils Relative %: 94 %
PLATELETS: 446 10*3/uL — AB (ref 150–400)
RBC: 2.68 MIL/uL — AB (ref 3.87–5.11)
RDW: 15.8 % — AB (ref 11.5–15.5)
WBC: 11.9 10*3/uL — AB (ref 4.0–10.5)

## 2018-04-09 LAB — COMPREHENSIVE METABOLIC PANEL
ALBUMIN: 2.3 g/dL — AB (ref 3.5–5.0)
ALT: 20 U/L (ref 0–44)
AST: 48 U/L — ABNORMAL HIGH (ref 15–41)
Alkaline Phosphatase: 147 U/L — ABNORMAL HIGH (ref 38–126)
Anion gap: >20 — ABNORMAL HIGH (ref 5–15)
BUN: 58 mg/dL — AB (ref 8–23)
CALCIUM: 7.5 mg/dL — AB (ref 8.9–10.3)
CO2: 22 mmol/L (ref 22–32)
CREATININE: 11.75 mg/dL — AB (ref 0.44–1.00)
Chloride: 93 mmol/L — ABNORMAL LOW (ref 98–111)
GFR calc non Af Amer: 3 mL/min — ABNORMAL LOW (ref >60)
GFR, EST AFRICAN AMERICAN: 3 mL/min — AB (ref >60)
Glucose, Bld: 135 mg/dL — ABNORMAL HIGH (ref 70–99)
Potassium: 4.2 mmol/L (ref 3.5–5.1)
SODIUM: 137 mmol/L (ref 135–145)
Total Bilirubin: 0.5 mg/dL (ref 0.3–1.2)
Total Protein: 7.2 g/dL (ref 6.5–8.1)

## 2018-04-09 MED ORDER — SODIUM CHLORIDE 0.9 % IV BOLUS (SEPSIS)
1000.0000 mL | Freq: Once | INTRAVENOUS | Status: AC
  Administered 2018-04-09: 1000 mL via INTRAVENOUS

## 2018-04-09 MED ORDER — SODIUM CHLORIDE 0.9 % IV SOLN: 100.0000 mL | INTRAVENOUS | Status: DC | PRN

## 2018-04-09 MED ORDER — HEPARIN SODIUM (PORCINE) 1000 UNIT/ML DIALYSIS: 20.0000 [IU]/kg | INTRAMUSCULAR | Status: DC | PRN

## 2018-04-09 MED ORDER — IOPAMIDOL (ISOVUE-300) INJECTION 61%
100.0000 mL | Freq: Once | INTRAVENOUS | Status: AC | PRN
  Administered 2018-04-09: 80 mL via INTRAVENOUS

## 2018-04-09 MED ORDER — PENTAFLUOROPROP-TETRAFLUOROETH EX AERO: 1.0000 "application " | INHALATION_SPRAY | CUTANEOUS | Status: DC | PRN

## 2018-04-09 MED ORDER — CEFEPIME HCL 2 G IJ SOLR
INTRAMUSCULAR | Status: AC
  Filled 2018-04-09: qty 2

## 2018-04-09 MED ORDER — SODIUM CHLORIDE 0.9 % IV SOLN: 2.0000 g | INTRAVENOUS | Status: DC

## 2018-04-09 MED ORDER — DOXERCALCIFEROL 4 MCG/2ML IV SOLN
2.0000 ug | INTRAVENOUS | Status: DC
  Administered 2018-04-09: 2 ug via INTRAVENOUS

## 2018-04-09 MED ORDER — ACETAMINOPHEN 500 MG PO TABS
1000.0000 mg | ORAL_TABLET | Freq: Once | ORAL | Status: AC
  Administered 2018-04-09: 1000 mg via ORAL
  Filled 2018-04-09: qty 2

## 2018-04-09 MED ORDER — SODIUM CHLORIDE 0.9 % IV SOLN
2.0000 g | Freq: Once | INTRAVENOUS | Status: AC
  Administered 2018-04-10: 2 g via INTRAVENOUS
  Filled 2018-04-09: qty 2

## 2018-04-09 MED ORDER — DOXERCALCIFEROL 4 MCG/2ML IV SOLN
2.0000 ug | INTRAVENOUS | Status: DC
  Administered 2018-04-13: 2 ug via INTRAVENOUS
  Filled 2018-04-09: qty 2

## 2018-04-09 MED ORDER — SODIUM CHLORIDE 0.9 % IV SOLN
2.0000 g | Freq: Once | INTRAVENOUS | Status: AC
  Administered 2018-04-09: 2 g via INTRAVENOUS

## 2018-04-09 MED ORDER — SODIUM CHLORIDE 0.9 % IV SOLN
INTRAVENOUS | Status: DC | PRN
  Administered 2018-04-09: 500 mL via INTRAVENOUS

## 2018-04-09 MED ORDER — VANCOMYCIN HCL IN DEXTROSE 750-5 MG/150ML-% IV SOLN
750.0000 mg | Freq: Once | INTRAVENOUS | Status: AC
  Administered 2018-04-10: 750 mg via INTRAVENOUS

## 2018-04-09 MED ORDER — DOXERCALCIFEROL 4 MCG/2ML IV SOLN
INTRAVENOUS | Status: AC
  Administered 2018-04-09: 2 ug via INTRAVENOUS
  Filled 2018-04-09: qty 2

## 2018-04-09 MED ORDER — ONDANSETRON HCL 4 MG/2ML IJ SOLN
4.0000 mg | Freq: Once | INTRAMUSCULAR | Status: AC
  Administered 2018-04-09: 4 mg via INTRAVENOUS
  Filled 2018-04-09: qty 2

## 2018-04-09 MED ORDER — METRONIDAZOLE IN NACL 5-0.79 MG/ML-% IV SOLN
500.0000 mg | Freq: Three times a day (TID) | INTRAVENOUS | Status: DC
  Administered 2018-04-09 – 2018-04-12 (×10): 500 mg via INTRAVENOUS
  Filled 2018-04-09 (×10): qty 100

## 2018-04-09 MED ORDER — HEPARIN SODIUM (PORCINE) 1000 UNIT/ML IJ SOLN
INTRAMUSCULAR | Status: AC
  Administered 2018-04-09: 4000 [IU]
  Filled 2018-04-09: qty 4

## 2018-04-09 MED ORDER — VANCOMYCIN HCL IN DEXTROSE 1-5 GM/200ML-% IV SOLN
1000.0000 mg | Freq: Once | INTRAVENOUS | Status: AC
  Administered 2018-04-09: 1000 mg via INTRAVENOUS
  Filled 2018-04-09: qty 200

## 2018-04-09 MED ORDER — VANCOMYCIN HCL IN DEXTROSE 750-5 MG/150ML-% IV SOLN
INTRAVENOUS | Status: AC
  Administered 2018-04-10: 750 mg via INTRAVENOUS
  Filled 2018-04-09: qty 150

## 2018-04-09 MED ORDER — VANCOMYCIN HCL IN DEXTROSE 750-5 MG/150ML-% IV SOLN
750.0000 mg | INTRAVENOUS | Status: DC
  Filled 2018-04-09: qty 150

## 2018-04-09 NOTE — ED Notes (Signed)
Patient transported to CT 

## 2018-04-09 NOTE — Progress Notes (Signed)
Patient was brought by EMS by stretcher. Patient ambulated from stretcher to bed in room 47M-01. She is alert and oriented x4 and is telemetry box: 01. 2 IVs both in right arm. Call bell within reach and bed alarm is on.   Farley Ly RN

## 2018-04-09 NOTE — ED Notes (Signed)
Per EDP-patient okay with 1 IV at present.  Will reevaluate as needed.

## 2018-04-09 NOTE — ED Notes (Signed)
ED Provider at bedside. 

## 2018-04-09 NOTE — ED Provider Notes (Signed)
MSE was initiated and I personally evaluated the patient and placed orders (if any) at  6:54 AM on April 09, 2018.  The patient appears stable so that the remainder of the MSE may be completed by another provider.   72 year old female presents with 4 days of chills, slight cough which is nonproductive, nausea, vomiting.  No diarrhea, no urinary urgency or frequency or dysuria.  No known sick contacts.  On presentation, she is febrile and tachycardic.  Code sepsis is activated and she is started on IV fluids and antibiotics.   EKG Interpretation  Date/Time:  Friday April 09 2018 06:52:13 EST Ventricular Rate:  142 PR Interval:    QRS Duration: 82 QT Interval:  332 QTC Calculation: 511 R Axis:   52 Text Interpretation:  Sinus tachycardia Prolonged QT interval Artifact in lead(s) I II III aVR aVL V1 V2 When compared with ECG of 02/24/2018, HEART RATE has increased QT has lengthened Confirmed by Delora Fuel ([id number]) on 04/09/2018 0:51:83 AM         Delora Fuel, MD 35/82/51 [phone]

## 2018-04-09 NOTE — Procedures (Signed)
Patient seen on Hemodialysis. QB 400, UF goal 3L Treatment proceeding without problems.  Elmarie Shiley MD [facility]. Office # [phone] Pager # [phone] 6:49 PM

## 2018-04-09 NOTE — ED Notes (Signed)
Oxygen levels 90% on RA.  Placed on 2L [place].

## 2018-04-09 NOTE — ED Triage Notes (Signed)
Pt brought by her cousin  Pt lives at home  Pt state she has the chills this morning and has had them off and on since Monday  Pt states stomach feels sore  Pt had vomiting this morning prior to coming in and again once she got into her room   Pt states she was recently diagnosed with liver cancer

## 2018-04-09 NOTE — H&P (Signed)
History and Physical  Mary Chapman OMB:[id number] DOB: [birth year] DOA: 04/09/2018  Referring physician: Wonda Olds. Long PCP: Glendon Axe, MD  Outpatient Specialists: [facility] Patient coming from: Home & is able to ambulate well  Chief Complaint: Fever and chills with Rigor  HPI: Mary Chapman is a 72 y.o. female with medical history significant for diverticulitis end-stage renal disease on hemodialysis on Monday Wednesday Friday, she had skipped hemodialysis on Wednesday.  Her last hemodialysis was on Monday and this was because she says she was not feeling well she had some generalized stomach pain with emesis that was nonbloody denies diarrhea.  Hypertension tobacco use, chronic anemia of chronic disease due to hemodialysis end-stage renal disease adenocarcinoma of the liver that was diagnosed October 2019 followed by Dr. Burr Medico.  She went to hemodialysis this morning and was referred to the emergency department from hemodialysis center, she did not complete  today because she began to have rigors and fever.  She denies any sore throat headache body aches or sick contacts also no significant shortness of breath or chest pain she still smokes tobacco about 3 cigarettes a day    ED Course: Patient was noted to be in sepsis sepsis code was called.  She received IV antibiotics as well as IV hydration her lactic acid came down from 6.38-1.36  Review of Systems: Positive for fever chills abdominal pain nausea and vomiting Pt denies any diarrhea back pain rash headache numbness focal weakness shortness of breath chest pain.  Review of systems are otherwise negative   Past Medical History:  Diagnosis Date  . Diverticulitis   . Focal segmental glomerulosclerosis    ESRD on MWF HD  . Hypertension   . Tobacco abuse    Past Surgical History:  Procedure Laterality Date  . AV FISTULA PLACEMENT Left 03/18/2017   Procedure: Brachiocephalic ARTERIOVENOUS (AV) FISTULA CREATION left  arm;  Surgeon: Elam Dutch, MD;  Location: [place];  Service: Vascular;  Laterality: Left;  . BIOPSY  03/05/2018   Procedure: BIOPSY;  Surgeon: Rush Landmark Telford Nab., MD;  Location: [place];  Service: Gastroenterology;;  enteroscopy bx /cytology brushing Greig Castilla bx  . COLONOSCOPY WITH PROPOFOL N/A 03/05/2018   Procedure: COLONOSCOPY WITH PROPOFOL;  Surgeon: Rush Landmark Telford Nab., MD;  Location: [place];  Service: Gastroenterology;  Laterality: N/A;  Bx, Spot  . ENTEROSCOPY N/A 03/05/2018   Procedure: ENTEROSCOPY;  Surgeon: Rush Landmark Telford Nab., MD;  Location: [place];  Service: Gastroenterology;  Laterality: N/A;  BX, Brushing, & Spot  . INSERTION OF DIALYSIS CATHETER N/A 03/18/2017   Procedure: INSERTION OF DIALYSIS CATHETER;  Surgeon: Elam Dutch, MD;  Location: [place];  Service: Vascular;  Laterality: N/A;  . IR GENERIC HISTORICAL  08/15/2016   IR US GUIDE VASC ACCESS RIGHT 08/15/2016 Arne Cleveland, MD [facility]  . IR GENERIC HISTORICAL  08/15/2016   IR FLUORO GUIDE CV LINE RIGHT 08/15/2016 Arne Cleveland, MD [facility]  . SUBMUCOSAL INJECTION  03/05/2018   Procedure: SUBMUCOSAL INJECTION;  Surgeon: Rush Landmark Telford Nab., MD;  Location: [place];  Service: Gastroenterology;;  spot tatoo  . TUBAL LIGATION      Social History:  reports that she has been smoking cigarettes. She has a 5.00 pack-year smoking history. She has never used smokeless tobacco. She reports that she does not drink alcohol or use drugs.   No Known Allergies  Family History  Problem Relation Age of Onset  . Hypertension Mother   . Diabetes Mother   .  Hypertension Father   . Diabetes Father   . Hypertension Sister   . Hypertension Brother       Prior to Admission medications   Medication Sig Start Date End Date Taking? Authorizing Provider  metoprolol tartrate (LOPRESSOR) 50 MG tablet Take 50 mg by mouth 2 (two) times daily. 03/05/17   [provider]    multivitamin (RENA-VIT) TABS tablet Take 1 tablet by mouth at bedtime. 08/20/16   Rama, Venetia Maxon, MD  nicotine (NICODERM CQ - DOSED IN MG/24 HOURS) 21 mg/24hr patch Place 1 patch (21 mg total) onto the skin daily. 08/20/16   Rama, Venetia Maxon, MD  Nutritional Supplements (FEEDING SUPPLEMENT, NEPRO CARB STEADY,) LIQD Take 237 mLs by mouth 3 (three) times daily as needed (Supplement). 03/06/18   Ghimire, Henreitta Leber, MD  oxyCODONE-acetaminophen (PERCOCET) 5-325 MG tablet Take 1 tablet by mouth every 6 (six) hours as needed for severe pain. 03/06/18 03/06/19  Ghimire, Henreitta Leber, MD  pantoprazole (PROTONIX) 40 MG tablet Take 1 tablet (40 mg total) by mouth 2 (two) times daily. 03/25/18   Truitt Merle, MD  polyethylene glycol [facility]) packet Take 17 g by mouth daily. 03/06/18   Ghimire, Henreitta Leber, MD  Vitamin D, Ergocalciferol, (DRISDOL) 50000 units CAPS capsule Take 50,000 Units by mouth once a week. 01/16/17   [provider]    Physical Exam: BP (!) 104/53 (BP Location: Right Arm)   Pulse (!) 103   Temp 98.7 F (37.1 C) (Oral)   Resp (!) 22   Ht 5' 6.5" (1.689 m)   Wt 63.8 kg   SpO2 100%   BMI 22.36 kg/m   Exam:  . General: 72 y.o. year-old female well developed well nourished in no acute distress.  Alert and oriented x3. . Cardiovascular: Regular rate and rhythm with no rubs or gallops.  No thyromegaly or JVD noted.   Marland Kitchen Respiratory: Clear to auscultation with no wheezes or rales. Good inspiratory effort. . Abdomen: Soft nontender nondistended with normal bowel sounds x4 quadrants.  Palpable mass . Musculoskeletal: Trace lower extremity edema. 2/4 pulses in all 4 extremities. . Skin: No ulcerative lesions noted or rashes, . Psychiatry: Mood is appropriate for condition and setting           Labs on Admission:  Basic Metabolic Panel: Recent Labs  Lab 04/09/18 0704  NA 137  K 4.2  CL 93*  CO2 22  GLUCOSE 135*  BUN 58*  CREATININE 11.75*  CALCIUM 7.5*   Liver Function  Tests: Recent Labs  Lab 04/09/18 0704  AST 48*  ALT 20  ALKPHOS 147*  BILITOT 0.5  PROT 7.2  ALBUMIN 2.3*   No results for input(s): LIPASE, AMYLASE in the last 168 hours. No results for input(s): AMMONIA in the last 168 hours. CBC: Recent Labs  Lab 04/09/18 0704  WBC 11.9*  NEUTROABS 11.2*  HGB 8.0*  HCT 26.5*  MCV 98.9  PLT 446*   Cardiac Enzymes: No results for input(s): CKTOTAL, CKMB, CKMBINDEX, TROPONINI in the last 168 hours.  BNP (last 3 results) No results for input(s): BNP in the last 8760 hours.  ProBNP (last 3 results) No results for input(s): PROBNP in the last 8760 hours.  CBG: No results for input(s): GLUCAP in the last 168 hours.  Radiological Exams on Admission: Ct Abdomen Pelvis W Contrast  Result Date: 04/09/2018 CLINICAL DATA:  Abdominal pain and tenderness. Adenocarcinoma of the liver characterized on biopsy 02/26/2018. Concern for primary cholangiocarcinoma. EXAM: CT ABDOMEN AND  PELVIS WITH CONTRAST TECHNIQUE: Multidetector CT imaging of the abdomen and pelvis was performed using the standard protocol following bolus administration of intravenous contrast. CONTRAST:  73mL ISOVUE-300 IOPAMIDOL (ISOVUE-300) INJECTION 61% While interpreting the scan, no IV contrast was identified within the organs or vascular structures. CT tech Barbaraann Barthel) was asked to evaluate the patient in the emergency department as there is high suspicion of extravasation at injection site. In the interval patient had extravasation of IV saline from the ER interventions. ER staff and MD were advised of concern for IV contrast extravasation and patient was monitored and evaluated per extravasation protocol. COMPARISON:  CT 03/04/2018 FINDINGS: Lower chest: Lung bases are clear. Hepatobiliary: Significant interval increase in size of the RIGHT hepatic lobe infiltrative mass which now extends into the LEFT hepatic lobe measuring 8.0 x 4.7 cm compared to 5.0 x 4.9 cm on 03/04/2018. Coarse  calcification centrally and medially within the mass is unchanged. No IV contrast administered. Fullness in the porta hepatis likely represents adenopathy. Difficult to define on noncontrast exam. Pancreas: Pancreas is normal. No ductal dilatation. No pancreatic inflammation. Spleen: Normal spleen Adrenals/urinary tract: Adrenal glands and kidneys are normal. Nonobstructing calculi in the LEFT kidney the ureters and bladder normal. Stomach/Bowel: Stomach, small-bowel appendix and cecum normal. Multiple diverticula of the descending colon. Pericolonic inflammation along the ascending colon is slightly decreased. Extensive diverticulosis through the descending colon additionally. Rectum normal. Vascular/Lymphatic: Abdominal aorta is normal caliber with atherosclerotic calcification. There is no retroperitoneal or periportal lymphadenopathy. No pelvic lymphadenopathy. Reproductive: Uterus and ovaries normal. Other: No peritoneal metastasis Musculoskeletal: No aggressive osseous lesion IMPRESSION: 1. Significant interval increase in the central hepatic mass (biopsy-proven adenocarcinoma) in short 1 month interval with mass approximately doubling in size. 2. Fullness in the porta hepatis likely represents adenopathy. Difficult to define without sufficient IV contrast exam. 3. Some apparent improvement in inflammation the ascending colon. 4. Extensive diverticulosis atherosclerotic disease. Electronically Signed   By: Suzy Bouchard M.D.   On: 04/09/2018 09:17   Dg Chest Port 1 View  Result Date: 04/09/2018 CLINICAL DATA:  Shortness of breath.  Renal failure. EXAM: PORTABLE CHEST 1 VIEW COMPARISON:  April 01, 2018 chest CT and chest radiograph June 26, 2017 FINDINGS: There is mild interstitial thickening. No edema or consolidation. Heart is borderline enlarged with pulmonary vascularity normal. No adenopathy. No evident bone lesions. IMPRESSION: Borderline cardiac enlargement. Interstitial mildly prominent,  likely reflecting chronic inflammatory type change. No frank edema or consolidation evident. Electronically Signed   By: [name] III M.D.   On: 04/09/2018 07:11    EKG: Independently reviewed.  Per ER MD  Assessment/Plan Present on Admission: . Sepsis ([place]) . Anasarca . Tobacco abuse . Essential hypertension  Principal Problem:   Sepsis ([place]) Active Problems:   Anasarca   Tobacco abuse   Essential hypertension   ESRD on dialysis ([place])  1.  Sepsis due to on unspecified organism without acute organ dysfunction.  Patient was started on vancomycin, Flagyl, and cefepime and.  CT scan showed worsening liver mass with no other focus of infection flu test was negative checks x-ray does not show any pneumonia.  2.  Nausea and vomiting which is improved now continue antiemetics  3.  Generalized abdominal pain with progressive liver mass she is seeing Dr. Annamaria Boots for this.  4.  Mild anemia of chronic disease due to chronic kidney disease  5.  Uncontrolled hypertension  6.  Tobacco use disorder  7.  End-stage renal disease on hemodialysis  patient did not complete her immune hemodialysis today she has been scheduled for hemodialysis she does not appear to be volume overloaded  Severity of Illness: The appropriate patient status for this patient is INPATIENT. Inpatient status is judged to be reasonable and necessary in order to provide the required intensity of service to ensure the patient's safety. The patient's presenting symptoms, physical exam findings, and initial radiographic and laboratory data in the context of their chronic comorbidities is felt to place them at high risk for further clinical deterioration. Furthermore, it is not anticipated that the patient will be medically stable for discharge from the hospital within 2 midnights of admission. The following factors support the patient status of inpatient.   " The patient's presenting symptoms include tachycardia and fever  of 101.9. " The worrisome physical exam findings include l abdominal mass " The initial radiographic and laboratory data are worrisome because of lactic acid of 6.3 leukocytosis hemodialysis unknown source of infection. " The chronic co-morbidities include end-stage renal disease.   * I certify that at the point of admission it is my clinical judgment that the patient will require inpatient hospital care spanning beyond 2 midnights from the point of admission due to high intensity of service, high risk for further deterioration and high frequency of surveillance required.*    DVT prophylaxis: Heparin  Code Status: Full  Family Communication: None at bedside  Disposition Plan: Home when stable stable  Consults called: Nephrology Dr. Posey Pronto  Admission status: Inpatient    Cristal Deer MD [facility] Pager [phone]  If 7PM-7AM, please contact night-coverage [email or web address] Password [facility]  04/09/2018, 9:39 PM

## 2018-04-09 NOTE — Progress Notes (Signed)
Pharmacy Antibiotic Note  Mary Chapman is a 72 y.o. female admitted on 04/09/2018 with sepsis. Pharmacy has been consulted for vancomycin and cefepime dosing. Pt is febrile with WBC elevated. Lactic acid is initially elevated but now normalized. Pt with history of ESRD on but missed last HD.   Plan: Vancomycin 750mg  IV QHD Cefepime 2gm IV QHD F/u renal plans, C&S, clinical status and pre-HD vanc level as needed  Height: 5' 6.5" (168.9 cm) Weight: 140 lb (63.5 kg) IBW/kg (Calculated) : 60.45  Temp (24hrs), Avg:101.9 F (38.8 C), Min:101.9 F (38.8 C), Max:101.9 F (38.8 C)  Recent Labs  Lab 04/09/18 0704 04/09/18 0706  WBC 11.9*  --   CREATININE 11.75*  --   LATICACIDVEN  --  6.38*    Estimated Creatinine Clearance: 4.1 mL/min (A) (by C-G formula based on SCr of 11.75 mg/dL (H)).    No Known Allergies  Antimicrobials this admission: Vanc 11/22>> Cefepime 11/22>> Flagyl 11/22>>  Dose adjustments this admission: N/A  Microbiology results: Pending  Thank you for allowing pharmacy to be a part of this patient's care.  Clevon Khader, Rande Lawman 04/09/2018 8:28 AM

## 2018-04-09 NOTE — Consult Note (Addendum)
[facility] Renal Consultation Note    Indication for Consultation:  Management of ESRD/hemodialysis; anemia, hypertension/volume and secondary hyperparathyroidism   HPI: Mary Chapman is a 72 y.o. female with ESRD 2/2 FSGS on HD since 02/2017. PMH also HTN, recent dx of adenocarcinoma of the liver 02/2018 followed by Dr. Burr Medico.  Dialyzes MWF at [facility]. Last dialysis Monday 11/18. Felt too bad to go to dialysis Wednesday. Had some abd pain/nausea/vomiting starting last evening. Presented to her outpatient center this morning but with shaking chills elected to go to ED.   Admitted now with concern for sepsis. Reports subjective fever/chills x 4 days. Temp 101.9 in ED  with tachycardia, elevated WBCs, and lactic acid 6.38>1.36. No edema or infiltrates on CXR. CT abdomen showing significant increase in size of liver mass (doubled in size in 1 mo interval). Empiric antibiotics started.   Seen at bedside. Feeling better than this morning. Abd pain improving and asking for something to eat. Denies HA, cough, SOB, CP, N,V, D. Due for dialysis today.    Past Medical History:  Diagnosis Date  . Diverticulitis   . Focal segmental glomerulosclerosis    ESRD on MWF HD  . Hypertension   . Tobacco abuse    Past Surgical History:  Procedure Laterality Date  . AV FISTULA PLACEMENT Left 03/18/2017   Procedure: Brachiocephalic ARTERIOVENOUS (AV) FISTULA CREATION left arm;  Surgeon: Elam Dutch, MD;  Location: [place];  Service: Vascular;  Laterality: Left;  . BIOPSY  03/05/2018   Procedure: BIOPSY;  Surgeon: Rush Landmark Telford Nab., MD;  Location: [place];  Service: Gastroenterology;;  enteroscopy bx /cytology brushing Greig Castilla bx  . COLONOSCOPY WITH PROPOFOL N/A 03/05/2018   Procedure: COLONOSCOPY WITH PROPOFOL;  Surgeon: Rush Landmark Telford Nab., MD;  Location: [place];  Service: Gastroenterology;  Laterality: N/A;  Bx, Spot  . ENTEROSCOPY N/A 03/05/2018    Procedure: ENTEROSCOPY;  Surgeon: Rush Landmark Telford Nab., MD;  Location: [place];  Service: Gastroenterology;  Laterality: N/A;  BX, Brushing, & Spot  . INSERTION OF DIALYSIS CATHETER N/A 03/18/2017   Procedure: INSERTION OF DIALYSIS CATHETER;  Surgeon: Elam Dutch, MD;  Location: [place];  Service: Vascular;  Laterality: N/A;  . IR GENERIC HISTORICAL  08/15/2016   IR US GUIDE VASC ACCESS RIGHT 08/15/2016 Arne Cleveland, MD [facility]  . IR GENERIC HISTORICAL  08/15/2016   IR FLUORO GUIDE CV LINE RIGHT 08/15/2016 Arne Cleveland, MD [facility]  . SUBMUCOSAL INJECTION  03/05/2018   Procedure: SUBMUCOSAL INJECTION;  Surgeon: Rush Landmark Telford Nab., MD;  Location: [place];  Service: Gastroenterology;;  spot tatoo  . TUBAL LIGATION     Family History  Problem Relation Age of Onset  . Hypertension Mother   . Diabetes Mother   . Hypertension Father   . Diabetes Father   . Hypertension Sister   . Hypertension Brother    Social History:  reports that she has been smoking cigarettes. She has a 5.00 pack-year smoking history. She has never used smokeless tobacco. She reports that she does not drink alcohol or use drugs. No Known Allergies Prior to Admission medications   Medication Sig Start Date End Date Taking? Authorizing Provider  metoprolol tartrate (LOPRESSOR) 50 MG tablet Take 50 mg by mouth 2 (two) times daily. 03/05/17   [provider]  multivitamin (RENA-VIT) TABS tablet Take 1 tablet by mouth at bedtime. 08/20/16   Rama, Venetia Maxon, MD  nicotine (NICODERM CQ - DOSED IN MG/24 HOURS) 21 mg/24hr  patch Place 1 patch (21 mg total) onto the skin daily. 08/20/16   Rama, Venetia Maxon, MD  Nutritional Supplements (FEEDING SUPPLEMENT, NEPRO CARB STEADY,) LIQD Take 237 mLs by mouth 3 (three) times daily as needed (Supplement). 03/06/18   Ghimire, Henreitta Leber, MD  oxyCODONE-acetaminophen (PERCOCET) 5-325 MG tablet Take 1 tablet by mouth every 6 (six) hours as needed for severe  pain. 03/06/18 03/06/19  Ghimire, Henreitta Leber, MD  pantoprazole (PROTONIX) 40 MG tablet Take 1 tablet (40 mg total) by mouth 2 (two) times daily. 03/25/18   Truitt Merle, MD  polyethylene glycol [facility]) packet Take 17 g by mouth daily. 03/06/18   Ghimire, Henreitta Leber, MD  Vitamin D, Ergocalciferol, (DRISDOL) 50000 units CAPS capsule Take 50,000 Units by mouth once a week. 01/16/17   [provider]   Current Facility-Administered Medications  Medication Dose Route Frequency Provider Last Rate Last Dose  . 0.9 %  sodium chloride infusion   Intravenous PRN Long, Wonda Olds, MD   Stopped at 04/09/18 1208  . [START ON 04/12/2018] ceFEPIme (MAXIPIME) 2 g in sodium chloride 0.9 % 100 mL IVPB  2 g Intravenous Q M,W,F-2000 Rumbarger, Rachel L, RPH      . ceFEPIme (MAXIPIME) 2 g injection           . metroNIDAZOLE (FLAGYL) IVPB 500 mg  500 mg Intravenous C1Y Delora Fuel, MD   Stopped at 04/09/18 0848  . [START ON 04/12/2018] vancomycin (VANCOCIN) IVPB 750 mg/150 ml premix  750 mg Intravenous Q M,W,F-HD Rumbarger, Rachel L, RPH        ROS: As per HPI otherwise negative.  Physical Exam: Vitals:   04/09/18 1030 04/09/18 1100 04/09/18 1130 04/09/18 1352  BP: 113/60 107/60 (!) 101/56 (!) 115/59  Pulse: (!) 106  (!) 105 84  Resp: 20 19 16 18   Temp:    98.1 F (36.7 C)  TempSrc:    Oral  SpO2: 97% 98% 97% 99%  Weight:      Height:         General: Elderly female resting in bed NAD  Head: NCAT sclera not icteric MMM Neck: Supple. No JVD  Lungs: CTA bilaterally without wheezes, rales, or rhonchi. Breathing is unlabored. Heart: RRR soft SEM  Abdomen: BS+ soft ND mild RQ tenderness  Lower extremities:without edema or ischemic changes, no open wounds  Neuro: A & O  X 3. Moves all extremities spontaneously. Psych:  Responds to questions appropriately with a normal affect. Dialysis Access: LUE AVF +bruit   Labs: Basic Metabolic Panel: Recent Labs  Lab 04/09/18 0704  NA 137  K 4.2  CL 93*   CO2 22  GLUCOSE 135*  BUN 58*  CREATININE 11.75*  CALCIUM 7.5*   Liver Function Tests: Recent Labs  Lab 04/09/18 0704  AST 48*  ALT 20  ALKPHOS 147*  BILITOT 0.5  PROT 7.2  ALBUMIN 2.3*   No results for input(s): LIPASE, AMYLASE in the last 168 hours. No results for input(s): AMMONIA in the last 168 hours. CBC: Recent Labs  Lab 04/09/18 0704  WBC 11.9*  NEUTROABS 11.2*  HGB 8.0*  HCT 26.5*  MCV 98.9  PLT 446*   Cardiac Enzymes: No results for input(s): CKTOTAL, CKMB, CKMBINDEX, TROPONINI in the last 168 hours. CBG: No results for input(s): GLUCAP in the last 168 hours. Iron Studies: No results for input(s): IRON, TIBC, TRANSFERRIN, FERRITIN in the last 72 hours. Studies/Results: Ct Abdomen Pelvis W Contrast  Result Date: 04/09/2018 CLINICAL DATA:  Abdominal pain and tenderness. Adenocarcinoma of the liver characterized on biopsy 02/26/2018. Concern for primary cholangiocarcinoma. EXAM: CT ABDOMEN AND PELVIS WITH CONTRAST TECHNIQUE: Multidetector CT imaging of the abdomen and pelvis was performed using the standard protocol following bolus administration of intravenous contrast. CONTRAST:  52mL ISOVUE-300 IOPAMIDOL (ISOVUE-300) INJECTION 61% While interpreting the scan, no IV contrast was identified within the organs or vascular structures. CT tech Barbaraann Barthel) was asked to evaluate the patient in the emergency department as there is high suspicion of extravasation at injection site. In the interval patient had extravasation of IV saline from the ER interventions. ER staff and MD were advised of concern for IV contrast extravasation and patient was monitored and evaluated per extravasation protocol. COMPARISON:  CT 03/04/2018 FINDINGS: Lower chest: Lung bases are clear. Hepatobiliary: Significant interval increase in size of the RIGHT hepatic lobe infiltrative mass which now extends into the LEFT hepatic lobe measuring 8.0 x 4.7 cm compared to 5.0 x 4.9 cm on 03/04/2018. Coarse  calcification centrally and medially within the mass is unchanged. No IV contrast administered. Fullness in the porta hepatis likely represents adenopathy. Difficult to define on noncontrast exam. Pancreas: Pancreas is normal. No ductal dilatation. No pancreatic inflammation. Spleen: Normal spleen Adrenals/urinary tract: Adrenal glands and kidneys are normal. Nonobstructing calculi in the LEFT kidney the ureters and bladder normal. Stomach/Bowel: Stomach, small-bowel appendix and cecum normal. Multiple diverticula of the descending colon. Pericolonic inflammation along the ascending colon is slightly decreased. Extensive diverticulosis through the descending colon additionally. Rectum normal. Vascular/Lymphatic: Abdominal aorta is normal caliber with atherosclerotic calcification. There is no retroperitoneal or periportal lymphadenopathy. No pelvic lymphadenopathy. Reproductive: Uterus and ovaries normal. Other: No peritoneal metastasis Musculoskeletal: No aggressive osseous lesion IMPRESSION: 1. Significant interval increase in the central hepatic mass (biopsy-proven adenocarcinoma) in short 1 month interval with mass approximately doubling in size. 2. Fullness in the porta hepatis likely represents adenopathy. Difficult to define without sufficient IV contrast exam. 3. Some apparent improvement in inflammation the ascending colon. 4. Extensive diverticulosis atherosclerotic disease. Electronically Signed   By: Suzy Bouchard M.D.   On: 04/09/2018 09:17   Dg Chest Port 1 View  Result Date: 04/09/2018 CLINICAL DATA:  Shortness of breath.  Renal failure. EXAM: PORTABLE CHEST 1 VIEW COMPARISON:  April 01, 2018 chest CT and chest radiograph June 26, 2017 FINDINGS: There is mild interstitial thickening. No edema or consolidation. Heart is borderline enlarged with pulmonary vascularity normal. No adenopathy. No evident bone lesions. IMPRESSION: Borderline cardiac enlargement. Interstitial mildly prominent,  likely reflecting chronic inflammatory type change. No frank edema or consolidation evident. Electronically Signed   By: [name] III M.D.   On: 04/09/2018 07:11    Dialysis Orders:  [facility] MWF 4h BFR 400 EDW 62kg 2K/2.5Ca  L AVF Hep 4000 bolus 2000 mid run -Hectorol 2 mcg IV q week  -Mircera 167mcg IV q 2 weeks (last 11/13) -Auryixa 2 tabs qac   Assessment/Plan: 1.  Fever/chills. Code sepsis on adm. Unclear source. IV antibiotics started. Blood cultures pending  2.  ESRD -  MWF. Missed HD Wed. HD on schedule today    3.  Hypertension/volume  - BP stable. No volume on exam. UF to EDW as tolerated  4.  Anemia  - Hgb 8.0. Follow trends. Still getting ESA as outpatient.  5.  Metabolic bone disease -  Cont Hectorol/Auryxia binder 6. Nutrition. Low albumin 2/2 liver disease. Add prot supp 7. Adenocarcinoma of liver - new dx 02/2018. Abd CT showing  sig increase in size of liver mass in 1 mo interval. Followed by Dr. Daiva Nakayama PA-C [place] Pager [phone] 04/09/2018, 2:33 PM

## 2018-04-09 NOTE — Procedures (Signed)
1900 - Report received from D Adela Ports, RN and care of patient assumed.  Per report, there was difficulty with venous needle cannulation, requiring 2 cannulations for that site.  Patient's trmt proceding without difficulty at this time.   2030 - Patient c/o not feeling well, cold.  Oral temp obtained, VSS as documented on HD flowsheet.  Will continue to monitor. 2130 - Patient w/ intradialytic hypotension.  See HD flowsheet. 2246 - Trmt completed 2320 - Report given to T Issacs, RN.

## 2018-04-09 NOTE — ED Provider Notes (Signed)
Emergency Department Provider Note   I have reviewed the triage vital signs and the nursing notes.   HISTORY  Chief Complaint Fever and Emesis   HPI Mary Chapman is a 72 y.o. female ESRD and HTN presents to the emergency department for evaluation of chills with nausea/vomiting over the past 5 days.  Symptoms have significantly worsened over the last 24 hours.  Patient last had dialysis on Monday. She skipped HD on Wednesday because of feeling bad.  Had some generalized stomach discomfort with nonbloody emesis.  No diarrhea.  The patient rarely and and consistently makes urine.  She went to dialysis this morning and was referred to the emergency department after she began having rigors there.  She denies any significant shortness of breath or chest pain.  No sore throat or headache. No sick contacts.    Past Medical History:  Diagnosis Date  . Diverticulitis   . Focal segmental glomerulosclerosis    ESRD on MWF HD  . Hypertension   . Tobacco abuse     Patient Active Problem List   Diagnosis Date Noted  . Sepsis ([place]) 04/09/2018  . Intrahepatic cholangiocarcinoma ([place]) 03/11/2018  . Mass of cecum with bleeding 03/04/2018  . GI bleed 03/01/2018  . Gastrointestinal hemorrhage associated with anorectal source   . Mass of right lobe of liver   . Diverticulitis 02/23/2018  . ESRD on dialysis ([place]) 02/23/2018  . Hypophosphatemia 03/17/2017  . Hyperphosphatemia 03/17/2017  . Elevated troponin 03/16/2017  . Hyperlipidemia 03/16/2017  . SOB (shortness of breath) 03/16/2017  . Focal segmental glomerulosclerosis 03/15/2017  . Nephrotic syndrome 08/20/2016  . Essential hypertension 08/08/2016  . Dyspnea 08/08/2016  . Tobacco abuse   . Anasarca 08/07/2016    Past Surgical History:  Procedure Laterality Date  . AV FISTULA PLACEMENT Left 03/18/2017   Procedure: Brachiocephalic ARTERIOVENOUS (AV) FISTULA CREATION left arm;  Surgeon: Elam Dutch, MD;  Location: [facility];   Service: Vascular;  Laterality: Left;  . BIOPSY  03/05/2018   Procedure: BIOPSY;  Surgeon: Rush Landmark Telford Nab., MD;  Location: [facility];  Service: Gastroenterology;;  enteroscopy bx /cytology brushing Greig Castilla bx  . COLONOSCOPY WITH PROPOFOL N/A 03/05/2018   Procedure: COLONOSCOPY WITH PROPOFOL;  Surgeon: Rush Landmark Telford Nab., MD;  Location: [facility];  Service: Gastroenterology;  Laterality: N/A;  Bx, Spot  . ENTEROSCOPY N/A 03/05/2018   Procedure: ENTEROSCOPY;  Surgeon: Rush Landmark Telford Nab., MD;  Location: [facility];  Service: Gastroenterology;  Laterality: N/A;  BX, Brushing, & Spot  . INSERTION OF DIALYSIS CATHETER N/A 03/18/2017   Procedure: INSERTION OF DIALYSIS CATHETER;  Surgeon: Elam Dutch, MD;  Location: [facility];  Service: Vascular;  Laterality: N/A;  . IR GENERIC HISTORICAL  08/15/2016   IR US GUIDE VASC ACCESS RIGHT 08/15/2016 Arne Cleveland, MD [facility]  . IR GENERIC HISTORICAL  08/15/2016   IR FLUORO GUIDE CV LINE RIGHT 08/15/2016 Arne Cleveland, MD [facility]  . SUBMUCOSAL INJECTION  03/05/2018   Procedure: SUBMUCOSAL INJECTION;  Surgeon: Rush Landmark Telford Nab., MD;  Location: [facility];  Service: Gastroenterology;;  spot tatoo  . TUBAL LIGATION      Allergies Patient has no known allergies.  Family History  Problem Relation Age of Onset  . Hypertension Mother   . Diabetes Mother   . Hypertension Father   . Diabetes Father   . Hypertension Sister   . Hypertension Brother     Social History Social History   Tobacco Use  . Smoking status: Current  Some Day Smoker    Packs/day: 0.25    Years: 20.00    Pack years: 5.00    Types: Cigarettes  . Smokeless tobacco: Never Used  . Tobacco comment: 3 cigs/day  Substance Use Topics  . Alcohol use: No    Comment: moderate drinker for 10 years, quit in 30 years  . Drug use: No    Review of Systems  Constitutional: Positive fever/chills Eyes: No visual changes. ENT: No sore  throat. Cardiovascular: Denies chest pain. Respiratory: Denies shortness of breath. Gastrointestinal: Positive abdominal pain. Positive nausea and vomiting.  No diarrhea.  No constipation. Genitourinary: Negative for dysuria. Musculoskeletal: Negative for back pain. Skin: Negative for rash. Neurological: Negative for headaches, focal weakness or numbness.  10-point ROS otherwise negative.  ____________________________________________   PHYSICAL EXAM:  VITAL SIGNS: ED Triage Vitals  Enc Vitals Group     BP 04/09/18 0647 (!) 153/106     Pulse Rate 04/09/18 0647 (!) 142     Resp 04/09/18 0647 (!) 24     Temp 04/09/18 0647 (!) 101.9 F (38.8 C)     Temp Source 04/09/18 0647 Oral     SpO2 04/09/18 0647 100 %     Weight 04/09/18 0648 140 lb (63.5 kg)     Height 04/09/18 0648 5' 6.5" (1.689 m)     Pain Score 04/09/18 0647 7   Constitutional: Alert and oriented. Well appearing and in no acute distress. Able to provide a full history.  Eyes: Conjunctivae are normal. Head: Atraumatic. Nose: No congestion/rhinnorhea. Mouth/Throat: Mucous membranes are dry.  Neck: No stridor.  Cardiovascular: Tachycardia. Good peripheral circulation. Grossly normal heart sounds.   Respiratory: Increased respiratory effort.  No retractions. Lungs CTAB. Gastrointestinal: Soft with mild diffuse tenderness. No rebound or guarding. No distention.  Musculoskeletal: No lower extremity tenderness nor edema. No gross deformities of extremities. Neurologic:  Normal speech and language. No gross focal neurologic deficits are appreciated.  Skin:  Skin is warm, dry and intact. No rash noted.  ____________________________________________   LABS (all labs ordered are listed, but only abnormal results are displayed)  Labs Reviewed  COMPREHENSIVE METABOLIC PANEL - Abnormal; Notable for the following components:      Result Value   Chloride 93 (*)    Glucose, Bld 135 (*)    BUN 58 (*)    Creatinine, Ser 11.75  (*)    Calcium 7.5 (*)    Albumin 2.3 (*)    AST 48 (*)    Alkaline Phosphatase 147 (*)    GFR calc non Af Amer 3 (*)    GFR calc Af Amer 3 (*)    Anion gap >20 (*)    All other components within normal limits  CBC WITH DIFFERENTIAL/PLATELET - Abnormal; Notable for the following components:   WBC 11.9 (*)    RBC 2.68 (*)    Hemoglobin 8.0 (*)    HCT 26.5 (*)    RDW 15.8 (*)    Platelets 446 (*)    Neutro Abs 11.2 (*)    Lymphs Abs 0.6 (*)    All other components within normal limits  I-STAT CG4 LACTIC ACID, ED - Abnormal; Notable for the following components:   Lactic Acid, Venous 6.38 (*)    All other components within normal limits  CULTURE, BLOOD (ROUTINE X 2)  CULTURE, BLOOD (ROUTINE X 2)  INFLUENZA PANEL BY PCR (TYPE A & B)  I-STAT CG4 LACTIC ACID, ED   ____________________________________________  EKG  EKG Interpretation  Date/Time:  Friday April 09 2018 06:52:13 EST Ventricular Rate:  142 PR Interval:    QRS Duration: 82 QT Interval:  332 QTC Calculation: 511 R Axis:   52 Text Interpretation:  Sinus tachycardia Prolonged QT interval Artifact in lead(s) I II III aVR aVL V1 V2 When compared with ECG of 02/24/2018, HEART RATE has increased QT has lengthened Confirmed by Delora Fuel ([id number]) on 04/09/2018 6:56:36 AM       ____________________________________________  RADIOLOGY  Ct Abdomen Pelvis W Contrast  Result Date: 04/09/2018 CLINICAL DATA:  Abdominal pain and tenderness. Adenocarcinoma of the liver characterized on biopsy 02/26/2018. Concern for primary cholangiocarcinoma. EXAM: CT ABDOMEN AND PELVIS WITH CONTRAST TECHNIQUE: Multidetector CT imaging of the abdomen and pelvis was performed using the standard protocol following bolus administration of intravenous contrast. CONTRAST:  92mL ISOVUE-300 IOPAMIDOL (ISOVUE-300) INJECTION 61% While interpreting the scan, no IV contrast was identified within the organs or vascular structures. CT tech Barbaraann Barthel)  was asked to evaluate the patient in the emergency department as there is high suspicion of extravasation at injection site. In the interval patient had extravasation of IV saline from the ER interventions. ER staff and MD were advised of concern for IV contrast extravasation and patient was monitored and evaluated per extravasation protocol. COMPARISON:  CT 03/04/2018 FINDINGS: Lower chest: Lung bases are clear. Hepatobiliary: Significant interval increase in size of the RIGHT hepatic lobe infiltrative mass which now extends into the LEFT hepatic lobe measuring 8.0 x 4.7 cm compared to 5.0 x 4.9 cm on 03/04/2018. Coarse calcification centrally and medially within the mass is unchanged. No IV contrast administered. Fullness in the porta hepatis likely represents adenopathy. Difficult to define on noncontrast exam. Pancreas: Pancreas is normal. No ductal dilatation. No pancreatic inflammation. Spleen: Normal spleen Adrenals/urinary tract: Adrenal glands and kidneys are normal. Nonobstructing calculi in the LEFT kidney the ureters and bladder normal. Stomach/Bowel: Stomach, small-bowel appendix and cecum normal. Multiple diverticula of the descending colon. Pericolonic inflammation along the ascending colon is slightly decreased. Extensive diverticulosis through the descending colon additionally. Rectum normal. Vascular/Lymphatic: Abdominal aorta is normal caliber with atherosclerotic calcification. There is no retroperitoneal or periportal lymphadenopathy. No pelvic lymphadenopathy. Reproductive: Uterus and ovaries normal. Other: No peritoneal metastasis Musculoskeletal: No aggressive osseous lesion IMPRESSION: 1. Significant interval increase in the central hepatic mass (biopsy-proven adenocarcinoma) in short 1 month interval with mass approximately doubling in size. 2. Fullness in the porta hepatis likely represents adenopathy. Difficult to define without sufficient IV contrast exam. 3. Some apparent improvement in  inflammation the ascending colon. 4. Extensive diverticulosis atherosclerotic disease. Electronically Signed   By: Suzy Bouchard M.D.   On: 04/09/2018 09:17   Dg Chest Port 1 View  Result Date: 04/09/2018 CLINICAL DATA:  Shortness of breath.  Renal failure. EXAM: PORTABLE CHEST 1 VIEW COMPARISON:  April 01, 2018 chest CT and chest radiograph June 26, 2017 FINDINGS: There is mild interstitial thickening. No edema or consolidation. Heart is borderline enlarged with pulmonary vascularity normal. No adenopathy. No evident bone lesions. IMPRESSION: Borderline cardiac enlargement. Interstitial mildly prominent, likely reflecting chronic inflammatory type change. No frank edema or consolidation evident. Electronically Signed   By: [name] III M.D.   On: 04/09/2018 07:11    ____________________________________________   PROCEDURES  Procedure(s) performed:   Procedures  CRITICAL CARE Performed by: Margette Fast Total critical care time: 35 minutes Critical care time was exclusive of separately billable procedures and treating other patients. Critical care was  necessary to treat or prevent imminent or life-threatening deterioration. Critical care was time spent personally by me on the following activities: development of treatment plan with patient and/or surrogate as well as nursing, discussions with consultants, evaluation of patient's response to treatment, examination of patient, obtaining history from patient or surrogate, ordering and performing treatments and interventions, ordering and review of laboratory studies, ordering and review of radiographic studies, pulse oximetry and re-evaluation of patient's condition.  Nanda Quinton, MD Emergency Medicine  ____________________________________________   INITIAL IMPRESSION / ASSESSMENT AND PLAN / ED COURSE  Pertinent labs & imaging results that were available during my care of the patient were reviewed by me and considered  in my medical decision making (see chart for details).  Patient presents to the emergency department with fever, chills, nausea, vomiting.  She is tachycardic and febrile here.  No hypotension.  Patient rarely makes urine.  Mild shortness of breath and diffuse, mild abdominal pain.  No focal tenderness on exam.  No altered mental status or headache.  Chest x-ray reviewed with no focal infiltrate.  Lactate is elevated.  Patient has not been dialyzed since Monday and does not seem overtly fluid overloaded but is at risk for this and the need to treat for sepsis.  Antibiotics started by the previous, screening provider. Plan for CT abdomen/pelvis for further evaluation given mostly GI symptoms at this time and tenderness on exam.   CT reviewed with worsening liver mass but no other acute findings to explain symptoms. CXR without PNA. Flu negative. Labs with elevated lactate initially that cleared with IVF (2L). Patient not appearing volume overloaded after IVF. Potassium normal. Plan for admit for further treatment and HD.   Discussed patient's case with Hospitalist to request admission. Patient and family (if present) updated with plan. Care transferred to Hospitalist service.  I reviewed all nursing notes, vitals, pertinent old records, EKGs, labs, imaging (as available).  ____________________________________________  FINAL CLINICAL IMPRESSION(S) / ED DIAGNOSES  Final diagnoses:  Sepsis, due to unspecified organism, unspecified whether acute organ dysfunction present ([place])  Non-intractable vomiting with nausea, unspecified vomiting type  Generalized abdominal pain     MEDICATIONS GIVEN DURING THIS VISIT:  Medications  metroNIDAZOLE (FLAGYL) IVPB 500 mg (500 mg Intravenous New Bag/Given 04/09/18 1433)  0.9 %  sodium chloride infusion ( Intravenous Stopped 04/09/18 1208)  vancomycin (VANCOCIN) IVPB 750 mg/150 ml premix (has no administration in time range)  ceFEPIme (MAXIPIME) 2 g in sodium  chloride 0.9 % 100 mL IVPB (has no administration in time range)  ceFEPIme (MAXIPIME) 2 g injection (has no administration in time range)  doxercalciferol (HECTOROL) injection 2 mcg (has no administration in time range)  ceFEPIme (MAXIPIME) 2 g in sodium chloride 0.9 % 100 mL IVPB ( Intravenous Stopped 04/09/18 1103)  vancomycin (VANCOCIN) IVPB 1000 mg/200 mL premix (0 mg Intravenous Stopped 04/09/18 1035)  sodium chloride 0.9 % bolus 1,000 mL (0 mLs Intravenous Stopped 04/09/18 0817)    And  sodium chloride 0.9 % bolus 1,000 mL (0 mLs Intravenous Stopped 04/09/18 1036)  ondansetron (ZOFRAN) injection 4 mg (4 mg Intravenous Given 04/09/18 0717)  acetaminophen (TYLENOL) tablet 1,000 mg (1,000 mg Oral Given 04/09/18 0720)  iopamidol (ISOVUE-300) 61 % injection 100 mL (80 mLs Intravenous Contrast Given 04/09/18 0728)  heparin 1000 UNIT/ML injection (4,000 Units  Given 04/09/18 1853)    Note:  This document was prepared using Dragon voice recognition software and may include unintentional dictation errors.  Nanda Quinton, MD Emergency Medicine  Margette Fast, MD 04/09/18 [phone]

## 2018-04-10 DIAGNOSIS — D631 Anemia in chronic kidney disease: Secondary | ICD-10-CM

## 2018-04-10 DIAGNOSIS — N189 Chronic kidney disease, unspecified: Secondary | ICD-10-CM

## 2018-04-10 LAB — PROTIME-INR
INR: 1.23
Prothrombin Time: 15.4 seconds — ABNORMAL HIGH (ref 11.4–15.2)

## 2018-04-10 LAB — CBC
HCT: 19.6 % — ABNORMAL LOW (ref 36.0–46.0)
HCT: 20.7 % — ABNORMAL LOW (ref 36.0–46.0)
HEMOGLOBIN: 6 g/dL — AB (ref 12.0–15.0)
Hemoglobin: 6.4 g/dL — CL (ref 12.0–15.0)
MCH: 29.1 pg (ref 26.0–34.0)
MCH: 29.4 pg (ref 26.0–34.0)
MCHC: 30.6 g/dL (ref 30.0–36.0)
MCHC: 30.9 g/dL (ref 30.0–36.0)
MCV: 95.1 fL (ref 80.0–100.0)
MCV: 95 fL (ref 80.0–100.0)
NRBC: 0 % (ref 0.0–0.2)
Platelets: 293 10*3/uL (ref 150–400)
Platelets: 305 10*3/uL (ref 150–400)
RBC: 2.06 MIL/uL — AB (ref 3.87–5.11)
RBC: 2.18 MIL/uL — AB (ref 3.87–5.11)
RDW: 16.1 % — ABNORMAL HIGH (ref 11.5–15.5)
RDW: 16 % — ABNORMAL HIGH (ref 11.5–15.5)
WBC: 13.8 10*3/uL — AB (ref 4.0–10.5)
WBC: 13.7 10*3/uL — ABNORMAL HIGH (ref 4.0–10.5)
nRBC: 0 % (ref 0.0–0.2)

## 2018-04-10 LAB — IRON AND TIBC
IRON: 15 ug/dL — AB (ref 28–170)
Saturation Ratios: 11 % (ref 10.4–31.8)
TIBC: 132 ug/dL — AB (ref 250–450)
UIBC: 117 ug/dL

## 2018-04-10 LAB — BASIC METABOLIC PANEL
ANION GAP: 9 (ref 5–15)
BUN: 17 mg/dL (ref 8–23)
CALCIUM: 6.8 mg/dL — AB (ref 8.9–10.3)
CO2: 27 mmol/L (ref 22–32)
CREATININE: 5.19 mg/dL — AB (ref 0.44–1.00)
Chloride: 96 mmol/L — ABNORMAL LOW (ref 98–111)
GFR calc Af Amer: 9 mL/min — ABNORMAL LOW (ref >60)
GFR calc non Af Amer: 8 mL/min — ABNORMAL LOW (ref >60)
GLUCOSE: 105 mg/dL — AB (ref 70–99)
Potassium: 3.2 mmol/L — ABNORMAL LOW (ref 3.5–5.1)
SODIUM: 132 mmol/L — AB (ref 135–145)

## 2018-04-10 LAB — RETICULOCYTES
Immature Retic Fract: 13.1 % (ref 2.3–15.9)
RBC.: 2.15 MIL/uL — ABNORMAL LOW (ref 3.87–5.11)
Retic Count, Absolute: 17 10*3/uL — ABNORMAL LOW (ref 19.0–186.0)
Retic Ct Pct: 0.8 % (ref 0.4–3.1)

## 2018-04-10 LAB — FERRITIN: Ferritin: 2373 ng/mL — ABNORMAL HIGH (ref 11–307)

## 2018-04-10 LAB — HEMOGLOBIN AND HEMATOCRIT, BLOOD
HEMATOCRIT: 24.1 % — AB (ref 36.0–46.0)
Hemoglobin: 7.6 g/dL — ABNORMAL LOW (ref 12.0–15.0)

## 2018-04-10 LAB — FOLATE: FOLATE: 4.5 ng/mL — AB (ref >5.9)

## 2018-04-10 LAB — PREPARE RBC (CROSSMATCH)

## 2018-04-10 LAB — VITAMIN B12: VITAMIN B 12: 679 pg/mL (ref 180–914)

## 2018-04-10 MED ORDER — ONDANSETRON HCL 4 MG/2ML IJ SOLN: 4.0000 mg | Freq: Once | INTRAMUSCULAR | Status: DC

## 2018-04-10 MED ORDER — VITAMIN D (ERGOCALCIFEROL) 1.25 MG (50000 UNIT) PO CAPS
50000.0000 [IU] | ORAL_CAPSULE | ORAL | Status: DC
  Administered 2018-04-15: 50000 [IU] via ORAL
  Filled 2018-04-10: qty 1

## 2018-04-10 MED ORDER — SODIUM CHLORIDE 0.9% IV SOLUTION
Freq: Once | INTRAVENOUS | Status: AC
  Administered 2018-04-10: 14:00:00 via INTRAVENOUS

## 2018-04-10 MED ORDER — ACETAMINOPHEN 500 MG PO TABS
1000.0000 mg | ORAL_TABLET | Freq: Four times a day (QID) | ORAL | Status: DC | PRN
  Administered 2018-04-10 – 2018-04-15 (×5): 1000 mg via ORAL
  Filled 2018-04-10 (×5): qty 2

## 2018-04-10 MED ORDER — SODIUM CHLORIDE 0.9 % IV SOLN
INTRAVENOUS | Status: DC
  Administered 2018-04-10: 01:00:00 via INTRAVENOUS

## 2018-04-10 MED ORDER — PANTOPRAZOLE SODIUM 40 MG PO TBEC
40.0000 mg | DELAYED_RELEASE_TABLET | Freq: Two times a day (BID) | ORAL | Status: DC
  Administered 2018-04-10 – 2018-04-16 (×13): 40 mg via ORAL
  Filled 2018-04-10 (×13): qty 1

## 2018-04-10 MED ORDER — HEPARIN SODIUM (PORCINE) 5000 UNIT/ML IJ SOLN
5000.0000 [IU] | Freq: Three times a day (TID) | INTRAMUSCULAR | Status: DC
  Administered 2018-04-10: 5000 [IU] via SUBCUTANEOUS
  Filled 2018-04-10: qty 1

## 2018-04-10 NOTE — Progress Notes (Signed)
PROGRESS NOTE                                                                                                                                                                                                             Patient Demographics:    Mary Chapman, is a 72 y.o. female, DOB - May 04, 1946, SWN:[id number]  Admit date - 04/09/2018   Admitting Physician Guilford Shi, MD  Outpatient Primary MD for the patient is Glendon Axe, MD  LOS - 1   Chief Complaint  Patient presents with  . Fever  . Emesis       Brief Narrative    72 year old female with past medical history of end-stage renal disease on hemodialysis Monday Wednesday Friday, recent diagnosis of cholangiocarcinoma, started to follow with Dr. Annamaria Boots, she presents with fever and chills, admitted for further work-up.   Subjective:    Mary Chapman today has, No headache, No chest pain, reports mild abdominal pain, generalized weakness    Assessment  & Plan :    Principal Problem:   Sepsis ([place]) Active Problems:   Anasarca   Tobacco abuse   Essential hypertension   ESRD on dialysis ([place])  Sirs -Patient presents with fever, leukocytosis, elevated lactic acid, source of infection could not be identified yet, continue with empiric antibiotic coverage, vancomycin, Flagyl and cefepime. -His x-ray with no up pneumonia, does not make any urine -If septic work-up remains negative, this fever may be related to tumor.  Nausea and vomiting - Improved , continue with PRN meds  Cholangiocarcinoma -With Dr. Burr Medico, patient reports she did follow with Dr. Barry Dienes about surgery, other management per allergy as an outpatient, CT imaging showing almost doubling in her tumor size within 1 month.  Uncontrolled hypertension -Sugar is low, continue to hold meds  Anemia of chronic kidney disease -No signs of overt GI bleed, but recent endoscopy/colonoscopy significant for gastritis, duodenitis,  and cecal ulcer, follow on anemia panel, hemoglobin 6.4 this morning, will transfuse 1 unit PRBC, likely will transfuse another unit tomorrow with dialysis. -Monitor closely for signs of significant GI bleed, meanwhile continue with Protonix 40 twice daily, will hold subcu heparin for next 24 hours and resume if hemoglobin remains stable.  Tobacco use disorder  End-stage renal disease  - on Dialysis, renal consulted    Code Status : full  Family Communication  : none at bedside  Disposition Plan  : home when stable  Consults  :  Renal  Procedures  : none  DVT Prophylaxis  :  Barber heparin ( on hold)  Lab Results  Component Value Date   PLT 305 04/10/2018    Antibiotics  :    Anti-infectives (From admission, onward)   Start     Dose/Rate Route Frequency Ordered Stop   04/12/18 2000  ceFEPIme (MAXIPIME) 2 g in sodium chloride 0.9 % 100 mL IVPB     2 g 200 mL/hr over 30 Minutes Intravenous Every M-W-F (2000) 04/09/18 0935     04/12/18 1200  vancomycin (VANCOCIN) IVPB 750 mg/150 ml premix     750 mg 150 mL/hr over 60 Minutes Intravenous Every M-W-F (Hemodialysis) 04/09/18 0935     04/09/18 2200  ceFEPIme (MAXIPIME) 2 g in sodium chloride 0.9 % 100 mL IVPB     2 g 200 mL/hr over 30 Minutes Intravenous  Once 04/09/18 2111 04/10/18 0109   04/09/18 2115  vancomycin (VANCOCIN) IVPB 750 mg/150 ml premix     750 mg 150 mL/hr over 60 Minutes Intravenous  Once 04/09/18 2111 04/10/18 0218   04/09/18 1028  ceFEPIme (MAXIPIME) 2 g injection    Note to Pharmacy:  [name]   : cabinet override      04/09/18 1028 04/09/18 2244   04/09/18 0700  ceFEPIme (MAXIPIME) 2 g in sodium chloride 0.9 % 100 mL IVPB     2 g 200 mL/hr over 30 Minutes Intravenous  Once 04/09/18 0654 04/09/18 1103   04/09/18 0700  metroNIDAZOLE (FLAGYL) IVPB 500 mg     500 mg 100 mL/hr over 60 Minutes Intravenous Every 8 hours 04/09/18 0654     04/09/18 0700  vancomycin (VANCOCIN) IVPB 1000 mg/200 mL premix      1,000 mg 200 mL/hr over 60 Minutes Intravenous  Once 04/09/18 0654 04/09/18 1035        Objective:   Vitals:   04/09/18 2230 04/09/18 2310 04/10/18 0513 04/10/18 0735  BP: (!) 124/43 (!) 141/50 (!) 101/56 98/63  Pulse: (!) 110 (!) 119 79 72  Resp: (!) 24 (!) 24 16 18   Temp:  99.3 F (37.4 C) 98.1 F (36.7 C) 98.3 F (36.8 C)  TempSrc:  Oral Oral Oral  SpO2:  94% 100% 100%  Weight:  61.4 kg    Height:        Wt Readings from Last 3 Encounters:  04/09/18 61.4 kg  03/10/18 63 kg  03/05/18 63 kg     Intake/Output Summary (Last 24 hours) at 04/10/2018 1300 Last data filed at 04/10/2018 1200 Gross per 24 hour  Intake 1272.06 ml  Output 1269 ml  Net 3.06 ml     Physical Exam  Awake Alert, Oriented X 3, No new F.N deficits, Normal affect  Symmetrical Chest wall movement, Good air movement bilaterally, CTAB RRR,No Gallops,Rubs or new Murmurs, No Parasternal Heave +ve B.Sounds, Abd Soft, mild right quadrant tenderness ,no rebound - guarding or rigidity. No Cyanosis, Clubbing or edema, No new Rash or bruise      Data Review:    CBC Recent Labs  Lab 04/09/18 0704 04/10/18 0608 04/10/18 0954  WBC 11.9* 13.8* 13.7*  HGB 8.0* 6.0* 6.4*  HCT 26.5* 19.6* 20.7*  PLT 446* 293 305  MCV 98.9 95.1 95.0  MCH 29.9 29.1 29.4  MCHC 30.2 30.6 30.9  RDW 15.8* 16.1* 16.0*  LYMPHSABS 0.6*  --   --  MONOABS 0.1  --   --   EOSABS 0.0  --   --   BASOSABS 0.0  --   --     Chemistries  Recent Labs  Lab 04/09/18 0704 04/10/18 0608  NA 137 132*  K 4.2 3.2*  CL 93* 96*  CO2 22 27  GLUCOSE 135* 105*  BUN 58* 17  CREATININE 11.75* 5.19*  CALCIUM 7.5* 6.8*  AST 48*  --   ALT 20  --   ALKPHOS 147*  --   BILITOT 0.5  --    ------------------------------------------------------------------------------------------------------------------ No results for input(s): CHOL, HDL, LDLCALC, TRIG, CHOLHDL, LDLDIRECT in the last 72 hours.  No results found for:  HGBA1C ------------------------------------------------------------------------------------------------------------------ No results for input(s): TSH, T4TOTAL, T3FREE, THYROIDAB in the last 72 hours.  Invalid input(s): FREET3 ------------------------------------------------------------------------------------------------------------------ Recent Labs    04/10/18 1142  FOLATE 4.5*  RETICCTPCT 0.8    Coagulation profile Recent Labs  Lab 04/10/18 0608  INR 1.23    No results for input(s): DDIMER in the last 72 hours.  Cardiac Enzymes No results for input(s): CKMB, TROPONINI, MYOGLOBIN in the last 168 hours.  Invalid input(s): CK ------------------------------------------------------------------------------------------------------------------    Component Value Date/Time   BNP 141.1 (H) 03/14/2017 2200    Inpatient Medications  Scheduled Meds: . sodium chloride   Intravenous Once  . [START ON 04/14/2018] doxercalciferol  2 mcg Intravenous Q Wed-HD  . heparin  5,000 Units Subcutaneous Q8H  . ondansetron (ZOFRAN) IV  4 mg Intravenous Once  . pantoprazole  40 mg Oral BID  . Vitamin D (Ergocalciferol)  50,000 Units Oral Weekly   Continuous Infusions: . sodium chloride Stopped (04/09/18 1208)  . [START ON 04/12/2018] ceFEPime (MAXIPIME) IV    . metronidazole 500 mg (04/10/18 0939)  . [START ON 04/12/2018] vancomycin     PRN Meds:.sodium chloride, acetaminophen  Micro Results Recent Results (from the past 240 hour(s))  Blood Culture (routine x 2)     Status: None (Preliminary result)   Collection Time: 04/09/18  7:05 AM  Result Value Ref Range Status   Specimen Description   Final    BLOOD RIGHT FOREARM Performed at [facility], [address] [place], [place] [address]    Special Requests   Final    BOTTLES DRAWN AEROBIC AND ANAEROBIC Blood Culture adequate volume Performed at [facility], [address] [place], [place] [address]     Culture   Final    NO GROWTH < 12 HOURS Performed at [facility], [place] [address], [place], [place] [address]    Report Status PENDING  Incomplete  Blood Culture (routine x 2)     Status: None (Preliminary result)   Collection Time: 04/09/18  7:13 AM  Result Value Ref Range Status   Specimen Description   Final    BLOOD RIGHT ARM Performed at [facility], [address] [place], [place] [address]    Special Requests   Final    BOTTLES DRAWN AEROBIC AND ANAEROBIC Blood Culture adequate volume Performed at [facility], [address] [place], [place] [address]    Culture   Final    NO GROWTH < 12 HOURS Performed at [facility], [place] [address], [place], [place] [address]    Report Status PENDING  Incomplete    Radiology Reports Ct Chest Wo Contrast  Result Date: 04/02/2018 CLINICAL DATA:  72 year old female with recently diagnosed adenocarcinoma in  the liver. Abdominal pain. Follow-up study. EXAM: CT CHEST WITHOUT CONTRAST TECHNIQUE: Multidetector CT imaging of the chest was performed following the standard protocol without IV contrast. COMPARISON:  CT the chest, abdomen and pelvis 08/07/2016. FINDINGS: Cardiovascular: Heart size is normal. There is no significant pericardial fluid, thickening or pericardial calcification. There is aortic atherosclerosis, as well as atherosclerosis of the great vessels of the mediastinum and the coronary arteries, including calcified atherosclerotic plaque in the left main and left anterior descending coronary arteries. Mediastinum/Nodes: No pathologically enlarged mediastinal or hilar lymph nodes. Please note that accurate exclusion of hilar adenopathy is limited on noncontrast CT scans. Esophagus is unremarkable in appearance. No axillary lymphadenopathy. Lungs/Pleura: Right-sided Bochdalek hernia containing only retroperitoneal fat. Diffuse bronchial wall thickening with moderate centrilobular and mild paraseptal  emphysema. No acute consolidative airspace disease. No pleural effusions. 4 mm subpleural nodule in the periphery of the right lower lobe (axial image 106 of series 8). No other larger more suspicious appearing pulmonary nodules or masses are noted. Upper Abdomen: 2 large nonobstructive calculi in the upper pole collecting system of left kidney measuring up to 7 mm. Aortic atherosclerosis. Numerous colonic diverticulae are noted in the region of the splenic flexure. Musculoskeletal: There are no aggressive appearing lytic or blastic lesions noted in the visualized portions of the skeleton. IMPRESSION: 1. 4 mm subpleural nodule in the periphery of the right lower lobe. This is nonspecific, but strongly favored to represent a benign subpleural lymph node. Attention on follow-up studies is recommended to exclude the possibility of metastatic disease. 2. Diffuse bronchial wall thickening with moderate centrilobular and mild paraseptal emphysema; imaging findings suggestive of underlying COPD. 3. Aortic atherosclerosis, in addition to left main and left anterior descending coronary artery disease. Assessment for potential risk factor modification, dietary therapy or pharmacologic therapy may be warranted, if clinically indicated. 4. Nonobstructive calculi in the upper pole collecting system of left kidney measuring up to 7 mm. Aortic Atherosclerosis (ICD10-I70.0) and Emphysema (ICD10-J43.9). Electronically Signed   By: Vinnie Langton M.D.   On: 04/02/2018 11:05   Ct Abdomen Pelvis W Contrast  Result Date: 04/09/2018 CLINICAL DATA:  Abdominal pain and tenderness. Adenocarcinoma of the liver characterized on biopsy 02/26/2018. Concern for primary cholangiocarcinoma. EXAM: CT ABDOMEN AND PELVIS WITH CONTRAST TECHNIQUE: Multidetector CT imaging of the abdomen and pelvis was performed using the standard protocol following bolus administration of intravenous contrast. CONTRAST:  55mL ISOVUE-300 IOPAMIDOL (ISOVUE-300)  INJECTION 61% While interpreting the scan, no IV contrast was identified within the organs or vascular structures. CT tech Barbaraann Barthel) was asked to evaluate the patient in the emergency department as there is high suspicion of extravasation at injection site. In the interval patient had extravasation of IV saline from the ER interventions. ER staff and MD were advised of concern for IV contrast extravasation and patient was monitored and evaluated per extravasation protocol. COMPARISON:  CT 03/04/2018 FINDINGS: Lower chest: Lung bases are clear. Hepatobiliary: Significant interval increase in size of the RIGHT hepatic lobe infiltrative mass which now extends into the LEFT hepatic lobe measuring 8.0 x 4.7 cm compared to 5.0 x 4.9 cm on 03/04/2018. Coarse calcification centrally and medially within the mass is unchanged. No IV contrast administered. Fullness in the porta hepatis likely represents adenopathy. Difficult to define on noncontrast exam. Pancreas: Pancreas is normal. No ductal dilatation. No pancreatic inflammation. Spleen: Normal spleen Adrenals/urinary tract: Adrenal glands and kidneys are normal. Nonobstructing calculi in the LEFT kidney the ureters and bladder normal. Stomach/Bowel: Stomach,  small-bowel appendix and cecum normal. Multiple diverticula of the descending colon. Pericolonic inflammation along the ascending colon is slightly decreased. Extensive diverticulosis through the descending colon additionally. Rectum normal. Vascular/Lymphatic: Abdominal aorta is normal caliber with atherosclerotic calcification. There is no retroperitoneal or periportal lymphadenopathy. No pelvic lymphadenopathy. Reproductive: Uterus and ovaries normal. Other: No peritoneal metastasis Musculoskeletal: No aggressive osseous lesion IMPRESSION: 1. Significant interval increase in the central hepatic mass (biopsy-proven adenocarcinoma) in short 1 month interval with mass approximately doubling in size. 2. Fullness in  the porta hepatis likely represents adenopathy. Difficult to define without sufficient IV contrast exam. 3. Some apparent improvement in inflammation the ascending colon. 4. Extensive diverticulosis atherosclerotic disease. Electronically Signed   By: Suzy Bouchard M.D.   On: 04/09/2018 09:17   Dg Chest Port 1 View  Result Date: 04/09/2018 CLINICAL DATA:  Shortness of breath.  Renal failure. EXAM: PORTABLE CHEST 1 VIEW COMPARISON:  April 01, 2018 chest CT and chest radiograph June 26, 2017 FINDINGS: There is mild interstitial thickening. No edema or consolidation. Heart is borderline enlarged with pulmonary vascularity normal. No adenopathy. No evident bone lesions. IMPRESSION: Borderline cardiac enlargement. Interstitial mildly prominent, likely reflecting chronic inflammatory type change. No frank edema or consolidation evident. Electronically Signed   By: Mary Chapman III M.D.   On: 04/09/2018 07:11      Phillips Climes M.D on 04/10/2018 at 1:00 PM  Between 7am to 7pm - Pager - [phone]  After 7pm go to [email or web address] - password [facility]  [facility] -  Office  [phone]

## 2018-04-10 NOTE — Progress Notes (Addendum)
[facility] Progress Note   Subjective:  HD completed last night. IDHT noted per RN  Feels slightly better this morning.  Hgb 6.4 this am. Denies over losses, dark, bloody stools. s/p colonoscopy/endoscopy 10/18  Objective Vitals:   04/09/18 2230 04/09/18 2310 04/10/18 0513 04/10/18 0735  BP: (!) 124/43 (!) 141/50 (!) 101/56 98/63  Pulse: (!) 110 (!) 119 79 72  Resp: (!) 24 (!) 24 16 18   Temp:  99.3 F (37.4 C) 98.1 F (36.7 C) 98.3 F (36.8 C)  TempSrc:  Oral Oral Oral  SpO2:  94% 100% 100%  Weight:  61.4 kg    Height:       Physical Exam General: Elderly female alert NAD Heart: RRR soft SEM  Lungs: CTAB  Abdomen: soft mild RQ tenderness, palpable mass Extremities: No LE  Edema  Dialysis Access: LUE AVF +bruit   HP MWF 4h BFR 400 EDW 62kg 2K/2.5Ca  L AVF Hep 4000 bolus 2000 mid run -Hectorol 2 mcg IV q week  -Mircera 165mcg IV q 2 weeks (last 11/13) -Auryixa 2 tabs qac   Assessment/Plan: 1. Fever/chills. Code sepsis on adm. Unclear source. IV antibiotics started. Blood cultures ng to date.   2. ESRD -  MWF. Continue on schedule. Next HD 11/24 d/t holiday scheduling next week. Hold heparin on HD.    3. Hypertension/volume  - BP stable. No volume on exam. Keep even next HD  4. Anemia CKD/ABLA/Hx of GI bleed.  - Hgb trending down 8.0> 6.4.  1unit prbc ordered for today. Will follow and transfuse with HD tomorrow if remains <7. S/p colonoscopy 03/05/18 showing severe diverticulosis throughout the colon, single ulcer cecum - biopsied. Further w/u per primary.  5.  Metabolic bone disease -  Cont Hectorol/Auryxia binder 6. Nutrition. Low albumin 2/2 liver disease. Add prot supp 7. Adenocarcinoma of liver - new dx 02/2018. Abd CT showing sig increase in size of liver mass in 1 mo interval. Followed by Dr. Daiva Nakayama PA-C [place] Pager [phone] 04/10/2018,11:14 AM  LOS: 1 day   Pt seen, examined and agree w A/P as  above.  [name] MD [facility] pager [phone]   04/10/2018, 1:48 PM    Additional Objective Labs: Basic Metabolic Panel: Recent Labs  Lab 04/09/18 0704 04/10/18 0608  NA 137 132*  K 4.2 3.2*  CL 93* 96*  CO2 22 27  GLUCOSE 135* 105*  BUN 58* 17  CREATININE 11.75* 5.19*  CALCIUM 7.5* 6.8*   CBC: Recent Labs  Lab 04/09/18 0704 04/10/18 0608 04/10/18 0954  WBC 11.9* 13.8* 13.7*  NEUTROABS 11.2*  --   --   HGB 8.0* 6.0* 6.4*  HCT 26.5* 19.6* 20.7*  MCV 98.9 95.1 95.0  PLT 446* 293 305   Blood Culture    Component Value Date/Time   SDES  04/09/2018 0713    BLOOD RIGHT ARM Performed at [facility], [address], [place], [place] [address]    [facility]  04/09/2018 [phone]    BOTTLES DRAWN AEROBIC AND ANAEROBIC Blood Culture adequate volume Performed at [facility], [address], [place], [place] [address]    CULT  04/09/2018 0713    NO GROWTH < 12 HOURS Performed at [facility], [place] [address] [place], [place] [address]    REPTSTATUS PENDING 04/09/2018 6295    Cardiac Enzymes: No results for input(s): CKTOTAL, CKMB, CKMBINDEX, TROPONINI in the  last 168 hours. CBG: No results for input(s): GLUCAP in the last 168 hours. Iron Studies: No results for input(s): IRON, TIBC, TRANSFERRIN, FERRITIN in the last 72 hours. Lab Results  Component Value Date   INR 1.23 04/10/2018   INR 1.17 02/26/2018   INR 0.97 03/18/2017   Medications: . sodium chloride Stopped (04/09/18 1208)  . sodium chloride 75 mL/hr at 04/10/18 0041  . [START ON 04/12/2018] ceFEPime (MAXIPIME) IV    . metronidazole 500 mg (04/10/18 0939)  . [START ON 04/12/2018] vancomycin     . sodium chloride   Intravenous Once  . [START ON 04/14/2018] doxercalciferol  2 mcg Intravenous Q Wed-HD  . heparin  5,000 Units Subcutaneous Q8H  . ondansetron (ZOFRAN) IV  4 mg Intravenous Once

## 2018-04-11 LAB — BLOOD CULTURE ID PANEL (REFLEXED)
ACINETOBACTER BAUMANNII: NOT DETECTED
Acinetobacter baumannii: NOT DETECTED
CANDIDA KRUSEI: NOT DETECTED
CANDIDA PARAPSILOSIS: NOT DETECTED
CANDIDA TROPICALIS: NOT DETECTED
CANDIDA TROPICALIS: NOT DETECTED
Candida albicans: NOT DETECTED
Candida albicans: NOT DETECTED
Candida glabrata: NOT DETECTED
Candida glabrata: NOT DETECTED
Candida krusei: NOT DETECTED
Candida parapsilosis: NOT DETECTED
ENTEROBACTER CLOACAE COMPLEX: NOT DETECTED
ENTEROBACTERIACEAE SPECIES: NOT DETECTED
ESCHERICHIA COLI: NOT DETECTED
ESCHERICHIA COLI: NOT DETECTED
Enterobacter cloacae complex: NOT DETECTED
Enterobacteriaceae species: NOT DETECTED
Enterococcus species: NOT DETECTED
Enterococcus species: NOT DETECTED
HAEMOPHILUS INFLUENZAE: NOT DETECTED
Haemophilus influenzae: NOT DETECTED
KLEBSIELLA OXYTOCA: NOT DETECTED
KLEBSIELLA OXYTOCA: NOT DETECTED
KLEBSIELLA PNEUMONIAE: NOT DETECTED
Klebsiella pneumoniae: NOT DETECTED
Listeria monocytogenes: NOT DETECTED
Listeria monocytogenes: NOT DETECTED
Neisseria meningitidis: NOT DETECTED
Neisseria meningitidis: NOT DETECTED
PROTEUS SPECIES: NOT DETECTED
PSEUDOMONAS AERUGINOSA: NOT DETECTED
Proteus species: NOT DETECTED
Pseudomonas aeruginosa: NOT DETECTED
STAPHYLOCOCCUS SPECIES: NOT DETECTED
STAPHYLOCOCCUS SPECIES: NOT DETECTED
STREPTOCOCCUS AGALACTIAE: NOT DETECTED
Serratia marcescens: NOT DETECTED
Serratia marcescens: NOT DETECTED
Staphylococcus aureus (BCID): NOT DETECTED
Staphylococcus aureus (BCID): NOT DETECTED
Streptococcus agalactiae: NOT DETECTED
Streptococcus pneumoniae: NOT DETECTED
Streptococcus pneumoniae: NOT DETECTED
Streptococcus pyogenes: NOT DETECTED
Streptococcus pyogenes: NOT DETECTED
Streptococcus species: NOT DETECTED
Streptococcus species: NOT DETECTED

## 2018-04-11 LAB — RENAL FUNCTION PANEL
ANION GAP: 9 (ref 5–15)
Albumin: 1.6 g/dL — ABNORMAL LOW (ref 3.5–5.0)
BUN: 35 mg/dL — ABNORMAL HIGH (ref 8–23)
CO2: 26 mmol/L (ref 22–32)
Calcium: 7.2 mg/dL — ABNORMAL LOW (ref 8.9–10.3)
Chloride: 101 mmol/L (ref 98–111)
Creatinine, Ser: 6.32 mg/dL — ABNORMAL HIGH (ref 0.44–1.00)
GFR calc Af Amer: 7 mL/min — ABNORMAL LOW (ref >60)
GFR calc non Af Amer: 6 mL/min — ABNORMAL LOW (ref >60)
Glucose, Bld: 101 mg/dL — ABNORMAL HIGH (ref 70–99)
POTASSIUM: 3.5 mmol/L (ref 3.5–5.1)
Phosphorus: 3.9 mg/dL (ref 2.5–4.6)
Sodium: 136 mmol/L (ref 135–145)

## 2018-04-11 LAB — CBC
HEMATOCRIT: 24.7 % — AB (ref 36.0–46.0)
HEMOGLOBIN: 7.6 g/dL — AB (ref 12.0–15.0)
MCH: 29.6 pg (ref 26.0–34.0)
MCHC: 30.8 g/dL (ref 30.0–36.0)
MCV: 96.1 fL (ref 80.0–100.0)
Platelets: 306 10*3/uL (ref 150–400)
RBC: 2.57 MIL/uL — ABNORMAL LOW (ref 3.87–5.11)
RDW: 16.3 % — ABNORMAL HIGH (ref 11.5–15.5)
WBC: 13.4 10*3/uL — ABNORMAL HIGH (ref 4.0–10.5)
nRBC: 0 % (ref 0.0–0.2)

## 2018-04-11 LAB — PREPARE RBC (CROSSMATCH)

## 2018-04-11 MED ORDER — ACETAMINOPHEN 325 MG PO TABS
ORAL_TABLET | ORAL | Status: AC
  Administered 2018-04-11: 650 mg via ORAL
  Filled 2018-04-11: qty 2

## 2018-04-11 MED ORDER — FOLIC ACID 1 MG PO TABS
1.0000 mg | ORAL_TABLET | Freq: Every day | ORAL | Status: DC
  Administered 2018-04-11 – 2018-04-16 (×6): 1 mg via ORAL
  Filled 2018-04-11 (×6): qty 1

## 2018-04-11 MED ORDER — SODIUM CHLORIDE 0.9 % IV SOLN: 2.0000 g | INTRAVENOUS | Status: DC

## 2018-04-11 MED ORDER — LIDOCAINE HCL (PF) 1 % IJ SOLN: 5.0000 mL | INTRAMUSCULAR | Status: DC | PRN

## 2018-04-11 MED ORDER — SODIUM CHLORIDE 0.9% IV SOLUTION
Freq: Once | INTRAVENOUS | Status: AC
  Administered 2018-04-11: 09:00:00 via INTRAVENOUS

## 2018-04-11 MED ORDER — PENTAFLUOROPROP-TETRAFLUOROETH EX AERO: 1.0000 "application " | INHALATION_SPRAY | CUTANEOUS | Status: DC | PRN

## 2018-04-11 MED ORDER — SODIUM CHLORIDE 0.9 % IV SOLN
2.0000 g | INTRAVENOUS | Status: DC
  Administered 2018-04-11: 2 g via INTRAVENOUS
  Filled 2018-04-11: qty 2

## 2018-04-11 MED ORDER — OXYCODONE HCL 5 MG PO TABS
5.0000 mg | ORAL_TABLET | ORAL | Status: DC | PRN
  Administered 2018-04-11 – 2018-04-16 (×12): 5 mg via ORAL
  Filled 2018-04-11 (×10): qty 1

## 2018-04-11 MED ORDER — HEPARIN SODIUM (PORCINE) 1000 UNIT/ML DIALYSIS: 1000.0000 [IU] | INTRAMUSCULAR | Status: DC | PRN

## 2018-04-11 MED ORDER — ACETAMINOPHEN 325 MG PO TABS
650.0000 mg | ORAL_TABLET | ORAL | Status: AC
  Administered 2018-04-11: 650 mg via ORAL

## 2018-04-11 MED ORDER — SODIUM CHLORIDE 0.9 % IV SOLN: 100.0000 mL | INTRAVENOUS | Status: DC | PRN

## 2018-04-11 MED ORDER — POLYETHYLENE GLYCOL 3350 17 G PO PACK
17.0000 g | PACK | Freq: Every day | ORAL | Status: DC | PRN
  Administered 2018-04-11: 17 g via ORAL
  Filled 2018-04-11: qty 1

## 2018-04-11 MED ORDER — PRO-STAT SUGAR FREE PO LIQD
30.0000 mL | Freq: Two times a day (BID) | ORAL | Status: DC
  Administered 2018-04-11 – 2018-04-15 (×10): 30 mL via ORAL
  Filled 2018-04-11 (×10): qty 30

## 2018-04-11 MED ORDER — SENNOSIDES-DOCUSATE SODIUM 8.6-50 MG PO TABS
2.0000 | ORAL_TABLET | Freq: Two times a day (BID) | ORAL | Status: DC
  Administered 2018-04-11 – 2018-04-16 (×10): 2 via ORAL
  Filled 2018-04-11 (×11): qty 2

## 2018-04-11 MED ORDER — HEPARIN SODIUM (PORCINE) 5000 UNIT/ML IJ SOLN
5000.0000 [IU] | Freq: Three times a day (TID) | INTRAMUSCULAR | Status: DC
  Administered 2018-04-11 – 2018-04-16 (×12): 5000 [IU] via SUBCUTANEOUS
  Filled 2018-04-11 (×11): qty 1

## 2018-04-11 MED ORDER — LIDOCAINE-PRILOCAINE 2.5-2.5 % EX CREA: 1.0000 "application " | TOPICAL_CREAM | CUTANEOUS | Status: DC | PRN

## 2018-04-11 NOTE — Progress Notes (Signed)
Telephoned attending Dr. Waldron Labs made aware of pt. HR 140's sustaining orders received and documented. Pt. Stable.

## 2018-04-11 NOTE — Progress Notes (Signed)
Pt. Arrived to unit alert and oriented. Shivering with chills. Vs obtained and documented. C/o abdominal spasms.

## 2018-04-11 NOTE — Progress Notes (Signed)
PHARMACY - PHYSICIAN COMMUNICATION CRITICAL VALUE ALERT - BLOOD CULTURE IDENTIFICATION (BCID)  Mary Chapman is an 73 y.o. female who presented to [facility] on 04/09/2018 with a chief complaint of fever/chills  11/22 Blood cultures: 2/4 gram negative rods, BCID didn't detect an organism  Assessment:  Gram negative rod bacteremia  Name of physician (or Provider) Contacted: Mary Chapman  Current antibiotics: Cefepime  Changes to prescribed antibiotics recommended:  No changes needed for now  No results found for this or any previous visit.  Mary Chapman 04/11/2018  7:39 AM

## 2018-04-11 NOTE — Progress Notes (Signed)
Pt.  HR 130's-140's. Dr. Jonnie Finner  Paged and notified orders to decrease goal to 2032ml. Continue with treatment

## 2018-04-11 NOTE — Progress Notes (Signed)
Seen in HD unit. RN reported HR 140s during HD and SBP 80s.  Stat EKG showing sinus tach. Likely volume depleted on HD. >>UF goal 2.8L > per RN goal based on bed weights.    -Will rinse back and get 543mL bolus now. HR improving 120. BP 105/73. Continue to monitor.  -To use standing weights next HD  Lynnda Child PA-C [facility] Pager [phone] 04/11/2018,4:46 PM

## 2018-04-11 NOTE — Progress Notes (Signed)
PROGRESS NOTE                                                                                                                                                                                                             Patient Demographics:    Talulah Schirmer, is a 72 y.o. female, DOB - 27-Nov-1945, WGN:[id number]  Admit date - 04/09/2018   Admitting Physician Guilford Shi, MD  Outpatient Primary MD for the patient is Glendon Axe, MD  LOS - 2   Chief Complaint  Patient presents with  . Fever  . Emesis       Brief Narrative    72 year old female with past medical history of end-stage renal disease on hemodialysis Monday Wednesday Friday, recent diagnosis of cholangiocarcinoma, started to follow with Dr. Annamaria Boots, she presents with fever and chills, her work-up significant for gram-negative rods bacteremia, and anemia.   Subjective:    [name] today has, No headache, No chest pain, hours, she did have some nausea but had good oral intake, does report abdominal pain, right upper quadrant.   Assessment  & Plan :    Principal Problem:   Sepsis ([place]) Active Problems:   Anasarca   Tobacco abuse   Essential hypertension   ESRD on dialysis ([place])  Sepsis due to GNR bacteremia -Sepsis present on admission as  presents with fever, leukocytosis, elevated lactic acid. -Secondary to gram-negative bacteremia, this is most likely related to abdominal source from her cholangiocarcinoma. -She makes minimal amount of urine, will try to send urine analysis and urine cultures. -Continue with IV cefepime and Flagyl source might be abdominal related to cholangiocarcinoma.  Nausea and vomiting - Improved , good oral intake  Cholangiocarcinoma -she followes With Dr. Burr Medico, patient reports she did follow with Dr. Barry Dienes about surgery,CT imaging showing almost doubling in her tumor size within 1 month.  Will discuss with Dr. Burr Medico tomorrow.  Uncontrolled  hypertension -10 you to hold meds as blood pressure remains soft  Anemia of chronic kidney disease -No signs of overt GI bleed, but recent endoscopy/colonoscopy significant for gastritis, duodenitis, and cecal ulcer, follow on anemia panel, globin low 6 on admission, she received 1 unit PRBC with good response, it is 7.6 today, she will receive another unit during dialysis today. -Low folic acid, will start on supplements -No signs of GI bleed  Protein calorie malnutrition -Albumin, her appetite is good, will start on supplements  Tobacco use disorder  End-stage renal disease  - on Dialysis, renal consulted    Code Status : full  Family Communication  : daughter  at bedside  Disposition Plan  : home when stable  Consults  :  Renal  Procedures  : none  DVT Prophylaxis  :  [place] heparin   Lab Results  Component Value Date   PLT 306 04/11/2018    Antibiotics  :    Anti-infectives (From admission, onward)   Start     Dose/Rate Route Frequency Ordered Stop   04/12/18 2000  ceFEPIme (MAXIPIME) 2 g in sodium chloride 0.9 % 100 mL IVPB     2 g 200 mL/hr over 30 Minutes Intravenous Every M-W-F (2000) 04/09/18 0935     04/12/18 1200  vancomycin (VANCOCIN) IVPB 750 mg/150 ml premix  Status:  Discontinued     750 mg 150 mL/hr over 60 Minutes Intravenous Every M-W-F (Hemodialysis) 04/09/18 0935 04/11/18 0726   04/09/18 2200  ceFEPIme (MAXIPIME) 2 g in sodium chloride 0.9 % 100 mL IVPB     2 g 200 mL/hr over 30 Minutes Intravenous  Once 04/09/18 2111 04/10/18 0109   04/09/18 2115  vancomycin (VANCOCIN) IVPB 750 mg/150 ml premix     750 mg 150 mL/hr over 60 Minutes Intravenous  Once 04/09/18 2111 04/10/18 0218   04/09/18 1028  ceFEPIme (MAXIPIME) 2 g injection    Note to Pharmacy:  [name]   : cabinet override      04/09/18 1028 04/09/18 2244   04/09/18 0700  ceFEPIme (MAXIPIME) 2 g in sodium chloride 0.9 % 100 mL IVPB     2 g 200 mL/hr over 30 Minutes Intravenous  Once  04/09/18 0654 04/09/18 1103   04/09/18 0700  metroNIDAZOLE (FLAGYL) IVPB 500 mg     500 mg 100 mL/hr over 60 Minutes Intravenous Every 8 hours 04/09/18 0654     04/09/18 0700  vancomycin (VANCOCIN) IVPB 1000 mg/200 mL premix     1,000 mg 200 mL/hr over 60 Minutes Intravenous  Once 04/09/18 0654 04/09/18 1035        Objective:   Vitals:   04/10/18 1730 04/10/18 2010 04/11/18 0452 04/11/18 0902  BP: 124/73 108/64 119/67 131/72  Pulse: 71 74 83 84  Resp: 18 18 16 18   Temp:  98 F (36.7 C) 98.9 F (37.2 C) 98.5 F (36.9 C)  TempSrc: Oral Oral Oral Oral  SpO2: 100% 99% 99% 94%  Weight:  64.7 kg    Height:        Wt Readings from Last 3 Encounters:  04/10/18 64.7 kg  03/10/18 63 kg  03/05/18 63 kg     Intake/Output Summary (Last 24 hours) at 04/11/2018 1143 Last data filed at 04/11/2018 1100 Gross per 24 hour  Intake 1755 ml  Output -  Net 1755 ml     Physical Exam  Awake Alert, Oriented X 3, No new F.N deficits, Normal affect Symmetrical Chest wall movement, Good air movement bilaterally, CTAB RRR,No Gallops,Rubs or new Murmurs, No Parasternal Heave +ve B.Sounds, Abd Soft, mild right upper quadrant tenderness, no rebound - guarding or rigidity. No Cyanosis, Clubbing or edema, No new Rash or bruise       Data Review:    CBC Recent Labs  Lab 04/09/18 0704 04/10/18 0608 04/10/18 0954 04/10/18 1919 04/11/18 0612  WBC 11.9* 13.8* 13.7*  --  13.4*  HGB 8.0* 6.0* 6.4* 7.6* 7.6*  HCT 26.5* 19.6* 20.7* 24.1* 24.7*  PLT 446* 293 305  --  306  MCV 98.9 95.1 95.0  --  96.1  MCH 29.9 29.1 29.4  --  29.6  MCHC 30.2 30.6 30.9  --  30.8  RDW 15.8* 16.1* 16.0*  --  16.3*  LYMPHSABS 0.6*  --   --   --   --   MONOABS 0.1  --   --   --   --   EOSABS 0.0  --   --   --   --   BASOSABS 0.0  --   --   --   --     Chemistries  Recent Labs  Lab 04/09/18 0704 04/10/18 0608 04/11/18 0612  NA 137 132* 136  K 4.2 3.2* 3.5  CL 93* 96* 101  CO2 22 27 26   GLUCOSE  135* 105* 101*  BUN 58* 17 35*  CREATININE 11.75* 5.19* 6.32*  CALCIUM 7.5* 6.8* 7.2*  AST 48*  --   --   ALT 20  --   --   ALKPHOS 147*  --   --   BILITOT 0.5  --   --    ------------------------------------------------------------------------------------------------------------------ No results for input(s): CHOL, HDL, LDLCALC, TRIG, CHOLHDL, LDLDIRECT in the last 72 hours.  No results found for: HGBA1C ------------------------------------------------------------------------------------------------------------------ No results for input(s): TSH, T4TOTAL, T3FREE, THYROIDAB in the last 72 hours.  Invalid input(s): FREET3 ------------------------------------------------------------------------------------------------------------------ Recent Labs    04/10/18 1142  VITAMINB12 679  FOLATE 4.5*  FERRITIN 2,373*  TIBC 132*  IRON 15*  RETICCTPCT 0.8    Coagulation profile Recent Labs  Lab 04/10/18 0608  INR 1.23    No results for input(s): DDIMER in the last 72 hours.  Cardiac Enzymes No results for input(s): CKMB, TROPONINI, MYOGLOBIN in the last 168 hours.  Invalid input(s): CK ------------------------------------------------------------------------------------------------------------------    Component Value Date/Time   BNP 141.1 (H) 03/14/2017 2200    Inpatient Medications  Scheduled Meds: . [START ON 04/14/2018] doxercalciferol  2 mcg Intravenous Q Wed-HD  . feeding supplement (PRO-STAT SUGAR FREE 64)  30 mL Oral BID  . ondansetron (ZOFRAN) IV  4 mg Intravenous Once  . pantoprazole  40 mg Oral BID  . senna-docusate  2 tablet Oral BID  . [START ON 04/15/2018] Vitamin D (Ergocalciferol)  50,000 Units Oral Weekly   Continuous Infusions: . sodium chloride Stopped (04/09/18 1208)  . [START ON 04/12/2018] ceFEPime (MAXIPIME) IV    . metronidazole Stopped (04/11/18 1030)   PRN Meds:.sodium chloride, acetaminophen, oxyCODONE, polyethylene glycol  Micro  Results Recent Results (from the past 240 hour(s))  Blood Culture (routine x 2)     Status: None (Preliminary result)   Collection Time: 04/09/18  7:05 AM  Result Value Ref Range Status   Specimen Description   Final    BLOOD RIGHT FOREARM Performed at [facility], [address] [place], [place] [address]    Special Requests   Final    BOTTLES DRAWN AEROBIC AND ANAEROBIC Blood Culture adequate volume Performed at [facility], [address] [place], [place] [address]    Culture  Setup Time   Final    GRAM NEGATIVE RODS ANAEROBIC BOTTLE ONLY CRITICAL RESULT CALLED TO, READ BACK BY AND VERIFIED WITH: PHARMD JAMES LEDFORD [phone] FCP Performed at [facility], [place] [address] [place], [place] [address]    Culture GRAM NEGATIVE RODS  Final   Report Status PENDING  Incomplete  Blood Culture (routine x 2)     Status: None (Preliminary result)   Collection Time: 04/09/18  7:13 AM  Result Value Ref Range Status   Specimen Description   Final    BLOOD RIGHT ARM Performed at [facility], [address] [place], [place] [address]    Special Requests   Final    BOTTLES DRAWN AEROBIC AND ANAEROBIC Blood Culture adequate volume Performed at [facility], [address] [place], [place] [address]    Culture  Setup Time   Final    GRAM NEGATIVE RODS AEROBIC BOTTLE ONLY CRITICAL RESULT CALLED TO, READ BACK BY AND VERIFIED WITH: [place] [phone] FCP    Culture   Final    NO GROWTH 1 DAY Performed at [facility], [place] [address] [place], [place] [address]    Report Status PENDING  Incomplete  Blood Culture ID Panel (Reflexed)     Status: None   Collection Time: 04/09/18  7:13 AM  Result Value Ref Range Status   Enterococcus species NOT DETECTED NOT DETECTED Final    Comment: CRITICAL RESULT CALLED TO, READ BACK BY AND VERIFIED WITH: PHARM D JAMES LEDFORD [phone] FCP    Listeria monocytogenes NOT  DETECTED NOT DETECTED Final   Staphylococcus species NOT DETECTED NOT DETECTED Final   Staphylococcus aureus (BCID) NOT DETECTED NOT DETECTED Final   Streptococcus species NOT DETECTED NOT DETECTED Final   Streptococcus agalactiae NOT DETECTED NOT DETECTED Final   Streptococcus pneumoniae NOT DETECTED NOT DETECTED Final   Streptococcus pyogenes NOT DETECTED NOT DETECTED Final   Acinetobacter baumannii NOT DETECTED NOT DETECTED Final   Enterobacteriaceae species NOT DETECTED NOT DETECTED Final   Enterobacter cloacae complex NOT DETECTED NOT DETECTED Final   Escherichia coli NOT DETECTED NOT DETECTED Final   Klebsiella oxytoca NOT DETECTED NOT DETECTED Final   Klebsiella pneumoniae NOT DETECTED NOT DETECTED Final   Proteus species NOT DETECTED NOT DETECTED Final   Serratia marcescens NOT DETECTED NOT DETECTED Final   Haemophilus influenzae NOT DETECTED NOT DETECTED Final   Neisseria meningitidis NOT DETECTED NOT DETECTED Final   Pseudomonas aeruginosa NOT DETECTED NOT DETECTED Final   Candida albicans NOT DETECTED NOT DETECTED Final   Candida glabrata NOT DETECTED NOT DETECTED Final   Candida krusei NOT DETECTED NOT DETECTED Final   Candida parapsilosis NOT DETECTED NOT DETECTED Final   Candida tropicalis NOT DETECTED NOT DETECTED Final    Comment: Performed at [facility], [place] [address], [place], [place] [address]    Radiology Reports Ct Chest Wo Contrast  Result Date: 04/02/2018 CLINICAL DATA:  72 year old female with recently diagnosed adenocarcinoma in the liver. Abdominal pain. Follow-up study. EXAM: CT CHEST WITHOUT CONTRAST TECHNIQUE: Multidetector CT imaging of the chest was performed following the standard protocol without IV contrast. COMPARISON:  CT the chest, abdomen and pelvis 08/07/2016. FINDINGS: Cardiovascular: Heart size is normal. There is no significant pericardial fluid, thickening or pericardial calcification. There is aortic atherosclerosis, as well as  atherosclerosis of the great vessels of the mediastinum and the coronary arteries, including calcified atherosclerotic plaque in the left main and left anterior descending coronary arteries. Mediastinum/Nodes: No pathologically enlarged mediastinal or hilar lymph nodes. Please note that accurate exclusion of hilar adenopathy is limited on noncontrast CT scans. Esophagus is unremarkable in appearance. No axillary lymphadenopathy. Lungs/Pleura: Right-sided Bochdalek hernia containing only retroperitoneal fat. Diffuse  bronchial wall thickening with moderate centrilobular and mild paraseptal emphysema. No acute consolidative airspace disease. No pleural effusions. 4 mm subpleural nodule in the periphery of the right lower lobe (axial image 106 of series 8). No other larger more suspicious appearing pulmonary nodules or masses are noted. Upper Abdomen: 2 large nonobstructive calculi in the upper pole collecting system of left kidney measuring up to 7 mm. Aortic atherosclerosis. Numerous colonic diverticulae are noted in the region of the splenic flexure. Musculoskeletal: There are no aggressive appearing lytic or blastic lesions noted in the visualized portions of the skeleton. IMPRESSION: 1. 4 mm subpleural nodule in the periphery of the right lower lobe. This is nonspecific, but strongly favored to represent a benign subpleural lymph node. Attention on follow-up studies is recommended to exclude the possibility of metastatic disease. 2. Diffuse bronchial wall thickening with moderate centrilobular and mild paraseptal emphysema; imaging findings suggestive of underlying COPD. 3. Aortic atherosclerosis, in addition to left main and left anterior descending coronary artery disease. Assessment for potential risk factor modification, dietary therapy or pharmacologic therapy may be warranted, if clinically indicated. 4. Nonobstructive calculi in the upper pole collecting system of left kidney measuring up to 7 mm. Aortic  Atherosclerosis (ICD10-I70.0) and Emphysema (ICD10-J43.9). Electronically Signed   By: Vinnie Langton M.D.   On: 04/02/2018 11:05   Ct Abdomen Pelvis W Contrast  Result Date: 04/09/2018 CLINICAL DATA:  Abdominal pain and tenderness. Adenocarcinoma of the liver characterized on biopsy 02/26/2018. Concern for primary cholangiocarcinoma. EXAM: CT ABDOMEN AND PELVIS WITH CONTRAST TECHNIQUE: Multidetector CT imaging of the abdomen and pelvis was performed using the standard protocol following bolus administration of intravenous contrast. CONTRAST:  25mL ISOVUE-300 IOPAMIDOL (ISOVUE-300) INJECTION 61% While interpreting the scan, no IV contrast was identified within the organs or vascular structures. CT tech Barbaraann Barthel) was asked to evaluate the patient in the emergency department as there is high suspicion of extravasation at injection site. In the interval patient had extravasation of IV saline from the ER interventions. ER staff and MD were advised of concern for IV contrast extravasation and patient was monitored and evaluated per extravasation protocol. COMPARISON:  CT 03/04/2018 FINDINGS: Lower chest: Lung bases are clear. Hepatobiliary: Significant interval increase in size of the RIGHT hepatic lobe infiltrative mass which now extends into the LEFT hepatic lobe measuring 8.0 x 4.7 cm compared to 5.0 x 4.9 cm on 03/04/2018. Coarse calcification centrally and medially within the mass is unchanged. No IV contrast administered. Fullness in the porta hepatis likely represents adenopathy. Difficult to define on noncontrast exam. Pancreas: Pancreas is normal. No ductal dilatation. No pancreatic inflammation. Spleen: Normal spleen Adrenals/urinary tract: Adrenal glands and kidneys are normal. Nonobstructing calculi in the LEFT kidney the ureters and bladder normal. Stomach/Bowel: Stomach, small-bowel appendix and cecum normal. Multiple diverticula of the descending colon. Pericolonic inflammation along the ascending  colon is slightly decreased. Extensive diverticulosis through the descending colon additionally. Rectum normal. Vascular/Lymphatic: Abdominal aorta is normal caliber with atherosclerotic calcification. There is no retroperitoneal or periportal lymphadenopathy. No pelvic lymphadenopathy. Reproductive: Uterus and ovaries normal. Other: No peritoneal metastasis Musculoskeletal: No aggressive osseous lesion IMPRESSION: 1. Significant interval increase in the central hepatic mass (biopsy-proven adenocarcinoma) in short 1 month interval with mass approximately doubling in size. 2. Fullness in the porta hepatis likely represents adenopathy. Difficult to define without sufficient IV contrast exam. 3. Some apparent improvement in inflammation the ascending colon. 4. Extensive diverticulosis atherosclerotic disease. Electronically Signed   By: Helane Gunther.D.  On: 04/09/2018 09:17   Dg Chest Port 1 View  Result Date: 04/09/2018 CLINICAL DATA:  Shortness of breath.  Renal failure. EXAM: PORTABLE CHEST 1 VIEW COMPARISON:  April 01, 2018 chest CT and chest radiograph June 26, 2017 FINDINGS: There is mild interstitial thickening. No edema or consolidation. Heart is borderline enlarged with pulmonary vascularity normal. No adenopathy. No evident bone lesions. IMPRESSION: Borderline cardiac enlargement. Interstitial mildly prominent, likely reflecting chronic inflammatory type change. No frank edema or consolidation evident. Electronically Signed   By: [name] III M.D.   On: 04/09/2018 07:11      Phillips Climes M.D on 04/11/2018 at 11:43 AM  Between 7am to 7pm - Pager - [phone]  After 7pm go to [email or web address] - password [email or web address]  [facility] -  Office  [phone]

## 2018-04-11 NOTE — Progress Notes (Signed)
Patient back from dialysis feeling weak and BP 90/54 pulse 122 94% O2 Sat  And Temp of 102.7 oral. Medicated with Tylenol 1000 mg PO. Patient alert and oriented with family members at bedside.  1819 BP rechecked 91/56 with patient alert and oriented.

## 2018-04-11 NOTE — Progress Notes (Addendum)
[facility] Progress Note   Subjective:  Seen in room. Some nausea, abd tenderness but able to eat breakfast   Objective Vitals:   04/10/18 1730 04/10/18 2010 04/11/18 0452 04/11/18 0902  BP: 124/73 108/64 119/67 131/72  Pulse: 71 74 83 84  Resp: 18 18 16 18   Temp:  98 F (36.7 C) 98.9 F (37.2 C) 98.5 F (36.9 C)  TempSrc: Oral Oral Oral Oral  SpO2: 100% 99% 99% 94%  Weight:  64.7 kg    Height:       Physical Exam General: Elderly female alert NAD Heart: RRR soft SEM  Lungs: CTAB  Abdomen: soft mild RQ tenderness, palpable mass Extremities: No LE  Edema  Dialysis Access: LUE AVF +bruit   HP MWF 4h BFR 400 EDW 62kg 2K/2.5Ca  L AVF Hep 4000 bolus 2000 mid run -Hectorol 2 mcg IV q week  -Mircera 149mcg IV q 2 weeks (last 11/13) -Auryixa 2 tabs qac   Assessment/Plan: 1. Fever/chills. Code sepsis on adm. Unclear source ?Abd etiology. Blood cultures +GNR. On cefepime - per primary.    2. ESRD -  MWF. Continue on schedule. HD today on holiday schedule. 4K bath. Hold heparin on HD.    3. Hypertension/volume  - BP stable. No volume on exam. At EDW. Keep even next HD  4. Anemia CKD/ABLA/Hx of GI bleed.  - Hgb 7.6 s/p 1unit prbc yesterday. Transfuse another unit with HD today. S/p colonoscopy 03/05/18 showing severe diverticulosis throughout the colon, single ulcer cecum - biopsied. Tsat 11% Ferritin 2373. Next ESA due 25/00.  5. Metabolic bone disease -  Cont Hectorol.  Auryxia binder. Follow labs here.  6. Nutrition. Low albumin 2/2 liver disease. Add prot supp 7. Adenocarcinoma of liver - new dx 02/2018. Abd CT showing sig increase in size of liver mass in 1 mo interval. Followed by Dr. Daiva Nakayama PA-C [place] Pager [phone] 04/11/2018,9:49 AM  LOS: 2 days   Pt seen, examined and agree w A/P as above.  [name] MD [facility] pager [phone]   04/11/2018, 1:52 PM     Additional  Objective Labs: Basic Metabolic Panel: Recent Labs  Lab 04/09/18 0704 04/10/18 0608 04/11/18 0612  NA 137 132* 136  K 4.2 3.2* 3.5  CL 93* 96* 101  CO2 22 27 26   GLUCOSE 135* 105* 101*  BUN 58* 17 35*  CREATININE 11.75* 5.19* 6.32*  CALCIUM 7.5* 6.8* 7.2*  PHOS  --   --  3.9   CBC: Recent Labs  Lab 04/09/18 0704 04/10/18 0608 04/10/18 0954 04/10/18 1919 04/11/18 0612  WBC 11.9* 13.8* 13.7*  --  13.4*  NEUTROABS 11.2*  --   --   --   --   HGB 8.0* 6.0* 6.4* 7.6* 7.6*  HCT 26.5* 19.6* 20.7* 24.1* 24.7*  MCV 98.9 95.1 95.0  --  96.1  PLT 446* 293 305  --  306   Blood Culture    Component Value Date/Time   SDES  04/09/2018 0713    BLOOD RIGHT ARM Performed at [facility], [address], [place], [place] [address]    [facility]  04/09/2018 [phone]    BOTTLES DRAWN AEROBIC AND ANAEROBIC Blood Culture adequate volume Performed at [facility] [address] [place], [place] [address]    CULT  04/09/2018 0713    NO GROWTH 1 DAY Performed at [facility], [place]  [address] [place], [place] [address]    REPTSTATUS PENDING 04/09/2018 0677    Cardiac Enzymes: No results for input(s): CKTOTAL, CKMB, CKMBINDEX, TROPONINI in the last 168 hours. CBG: No results for input(s): GLUCAP in the last 168 hours. Iron Studies:  Recent Labs    04/10/18 1142  IRON 15*  TIBC 132*  FERRITIN 2,373*   Lab Results  Component Value Date   INR 1.23 04/10/2018   INR 1.17 02/26/2018   INR 0.97 03/18/2017   Medications: . sodium chloride Stopped (04/09/18 1208)  . [START ON 04/12/2018] ceFEPime (MAXIPIME) IV    . metronidazole 500 mg (04/11/18 0930)   . [START ON 04/14/2018] doxercalciferol  2 mcg Intravenous Q Wed-HD  . ondansetron (ZOFRAN) IV  4 mg Intravenous Once  . pantoprazole  40 mg Oral BID  . senna-docusate  2 tablet Oral BID  . [START ON 04/15/2018] Vitamin D (Ergocalciferol)  50,000 Units Oral Weekly

## 2018-04-11 NOTE — Progress Notes (Signed)
Offered pt. Pain meds as ordered. Pt refused states she wants to wait. Pt. Turned and repositioned for comfort

## 2018-04-12 ENCOUNTER — Other Ambulatory Visit (HOSPITAL_COMMUNITY): Payer: Medicare Other

## 2018-04-12 ENCOUNTER — Inpatient Hospital Stay (HOSPITAL_COMMUNITY): Payer: Medicare Other

## 2018-04-12 DIAGNOSIS — R05 Cough: Secondary | ICD-10-CM

## 2018-04-12 LAB — HEPATIC FUNCTION PANEL
ALBUMIN: 1.5 g/dL — AB (ref 3.5–5.0)
ALT: 17 U/L (ref 0–44)
AST: 31 U/L (ref 15–41)
Alkaline Phosphatase: 103 U/L (ref 38–126)
Bilirubin, Direct: 0.1 mg/dL (ref 0.0–0.2)
Indirect Bilirubin: 0.5 mg/dL (ref 0.3–0.9)
TOTAL PROTEIN: 5.6 g/dL — AB (ref 6.5–8.1)
Total Bilirubin: 0.6 mg/dL (ref 0.3–1.2)

## 2018-04-12 LAB — BASIC METABOLIC PANEL
ANION GAP: 9 (ref 5–15)
BUN: 21 mg/dL (ref 8–23)
CO2: 26 mmol/L (ref 22–32)
Calcium: 7.3 mg/dL — ABNORMAL LOW (ref 8.9–10.3)
Chloride: 102 mmol/L (ref 98–111)
Creatinine, Ser: 4.53 mg/dL — ABNORMAL HIGH (ref 0.44–1.00)
GFR calc Af Amer: 10 mL/min — ABNORMAL LOW (ref >60)
GFR, EST NON AFRICAN AMERICAN: 9 mL/min — AB (ref >60)
Glucose, Bld: 84 mg/dL (ref 70–99)
POTASSIUM: 3.3 mmol/L — AB (ref 3.5–5.1)
Sodium: 137 mmol/L (ref 135–145)

## 2018-04-12 LAB — CULTURE, BLOOD (ROUTINE X 2): SPECIAL REQUESTS: ADEQUATE

## 2018-04-12 LAB — TYPE AND SCREEN
ABO/RH(D): O POS
ANTIBODY SCREEN: NEGATIVE
UNIT DIVISION: 0
Unit division: 0

## 2018-04-12 LAB — BPAM RBC
BLOOD PRODUCT EXPIRATION DATE: 201912192359
BLOOD PRODUCT EXPIRATION DATE: 201912202359
ISSUE DATE / TIME: 201911231410
ISSUE DATE / TIME: 201911241330
UNIT TYPE AND RH: 5100
UNIT TYPE AND RH: 5100

## 2018-04-12 LAB — CBC
HCT: 26.7 % — ABNORMAL LOW (ref 36.0–46.0)
Hemoglobin: 8.5 g/dL — ABNORMAL LOW (ref 12.0–15.0)
MCH: 29.5 pg (ref 26.0–34.0)
MCHC: 31.8 g/dL (ref 30.0–36.0)
MCV: 92.7 fL (ref 80.0–100.0)
NRBC: 0 % (ref 0.0–0.2)
PLATELETS: 290 10*3/uL (ref 150–400)
RBC: 2.88 MIL/uL — ABNORMAL LOW (ref 3.87–5.11)
RDW: 16.1 % — ABNORMAL HIGH (ref 11.5–15.5)
WBC: 25.3 10*3/uL — AB (ref 4.0–10.5)

## 2018-04-12 MED ORDER — CHLORHEXIDINE GLUCONATE CLOTH 2 % EX PADS
6.0000 | MEDICATED_PAD | Freq: Every day | CUTANEOUS | Status: DC
  Administered 2018-04-12 – 2018-04-14 (×2): 6 via TOPICAL

## 2018-04-12 MED ORDER — NEPRO/CARBSTEADY PO LIQD
237.0000 mL | Freq: Two times a day (BID) | ORAL | Status: DC
  Administered 2018-04-13 – 2018-04-16 (×7): 237 mL via ORAL
  Filled 2018-04-12 (×12): qty 237

## 2018-04-12 MED ORDER — SODIUM CHLORIDE 0.9 % IV SOLN
500.0000 mg | INTRAVENOUS | Status: DC
  Filled 2018-04-12 (×2): qty 0.5

## 2018-04-12 MED ORDER — SODIUM CHLORIDE 0.9 % IV SOLN
3.0000 g | INTRAVENOUS | Status: DC
  Administered 2018-04-12 – 2018-04-16 (×5): 3 g via INTRAVENOUS
  Filled 2018-04-12 (×5): qty 3

## 2018-04-12 NOTE — Progress Notes (Signed)
Pharmacy Antibiotic Note  Mary Chapman is a 72 y.o. female admitted on 04/09/2018 with sepsis and Bacteroides fragilis bacteremia. Pharmacy has been consulted for unasyn dosing. Pt with history of ESRD   Plan: Unasyn 3gm IV q24h F/u C&S, clinical status   Height: 5' 6.5" (168.9 cm) Weight: 144 lb 2.9 oz (65.4 kg) IBW/kg (Calculated) : 60.45  Temp (24hrs), Avg:99 F (37.2 C), Min:98.8 F (37.1 C), Max:99.3 F (37.4 C)  Recent Labs  Lab 04/09/18 0704 04/09/18 0706 04/09/18 0901 04/10/18 0608 04/10/18 0954 04/11/18 0612 04/12/18 0508  WBC 11.9*  --   --  13.8* 13.7* 13.4* 25.3*  CREATININE 11.75*  --   --  5.19*  --  6.32* 4.53*  LATICACIDVEN  --  6.38* 1.36  --   --   --   --     Estimated Creatinine Clearance: 10.7 mL/min (A) (by C-G formula based on SCr of 4.53 mg/dL (H)).    No Known Allergies  Antimicrobials this admission: Vanc 11/22>>11/24 Cefepime 11/22>>11/25 Flagyl 11/22>>11/25 Unasyn 11/25>>  Dose adjustments this admission: N/A  Microbiology results: 11/22: Blood: B. fragilis  Ayub Kirsh A. Levada Dy, PharmD, [place] Pager: [phone] Please utilize Amion for appropriate phone number to reach the unit pharmacist ([place])  04/12/2018 2:23 PM

## 2018-04-12 NOTE — Progress Notes (Signed)
PROGRESS NOTE                                                                                                                                                                                                             Patient Demographics:    Mary Chapman, is a 72 y.o. female, DOB - 09/09/1945, EXH:[id number]  Admit date - 04/09/2018   Admitting Physician Guilford Shi, MD  Outpatient Primary MD for the patient is Glendon Axe, MD  LOS - 3   Chief Complaint  Patient presents with  . Fever  . Emesis       Brief Narrative    72 year old female with past medical history of end-stage renal disease on hemodialysis Monday Wednesday Friday, recent diagnosis of cholangiocarcinoma, started to follow with Dr. Burr Medico, she presents with fever and chills, her work-up significant for gram-negative rods bacteremia, and anemia, gram-negative rods showing Bacteroides fragilis, culture required 2 units PRBC transfusion, no evidence of GI bleed.   Subjective:    Mary Chapman today has, No headache, No chest pain, hours, reports good appetite, mild nausea, right upper quadrant abdominal pain at baseline , he had some rigors during dialysis yesterday, but she had no fever .  Assessment  & Plan :    Principal Problem:   Sepsis ([place]) Active Problems:   Anasarca   Tobacco abuse   Essential hypertension   ESRD on dialysis ([place])  Sepsis due to GNR bacteremia/Bacteroides fragilis -Sepsis present on admission as  presents with fever, leukocytosis, elevated lactic acid. -Secondary to gram-negative bacteremia, Bacteroides fragilis ,this is most likely related to abdominal source from her cholangiocarcinoma, I have discussed with oncology, will evaluate today. -Repeat right upper quadrant ultrasound for further evaluation, LFTs within normal limits, no evidence of reduction with normal bili. -Prickly on cefepime, changed to meropenem today as she remains septic despite  2 days of appropriate antibiotic coverage, I will discuss further recommendation with ID. -She makes minimal amount of urine, will try to send urine analysis and urine cultures. -Significant cough today, will obtain 2 view chest x-ray encouraged to use incentive spirometry  Nausea and vomiting - Improved , good oral intake  Cholangiocarcinoma -she followes With Dr. Burr Medico, patient reports she did follow with Dr. Barry Dienes about surgery,CT imaging showing almost doubling in her tumor size within 1 month.  Call should  evaluate  Uncontrolled hypertension -hold meds as blood pressure remains soft  Anemia of chronic kidney disease -No signs of overt GI bleed, but recent endoscopy/colonoscopy significant for gastritis, duodenitis, and cecal ulcer, overall no evidence of significant GI bleed, she was transfused 2 units PRBC so far with good response  -Low folic acid, started on supplements -No signs of GI bleed  Moderate protein calorie malnutrition -Low albumin, appetite is good, started on Nepro  Tobacco use disorder  End-stage renal disease  - on Dialysis, renal consulted    Code Status : full  Family Communication  : Daughter at bedside 04/11/2018  Disposition Plan  : home when stable  Consults  :  Renal  Procedures  : none  DVT Prophylaxis  :  [place] heparin   Lab Results  Component Value Date   PLT 290 04/12/2018    Antibiotics  :    Anti-infectives (From admission, onward)   Start     Dose/Rate Route Frequency Ordered Stop   04/16/18 1200  ceFEPIme (MAXIPIME) 2 g in sodium chloride 0.9 % 100 mL IVPB  Status:  Discontinued     2 g 200 mL/hr over 30 Minutes Intravenous Every M-W-F (Hemodialysis) 04/11/18 1610 04/12/18 1004   04/12/18 2000  ceFEPIme (MAXIPIME) 2 g in sodium chloride 0.9 % 100 mL IVPB  Status:  Discontinued     2 g 200 mL/hr over 30 Minutes Intravenous Every M-W-F (2000) 04/09/18 0935 04/11/18 1610   04/12/18 1400  meropenem (MERREM) 500 mg in sodium  chloride 0.9 % 100 mL IVPB     500 mg 200 mL/hr over 30 Minutes Intravenous Every 24 hours 04/12/18 1004     04/12/18 1200  vancomycin (VANCOCIN) IVPB 750 mg/150 ml premix  Status:  Discontinued     750 mg 150 mL/hr over 60 Minutes Intravenous Every M-W-F (Hemodialysis) 04/09/18 0935 04/11/18 0726   04/11/18 1800  ceFEPIme (MAXIPIME) 2 g in sodium chloride 0.9 % 100 mL IVPB  Status:  Discontinued     2 g 200 mL/hr over 30 Minutes Intravenous Every T-Th-Sa (Hemodialysis) 04/11/18 1610 04/12/18 1004   04/09/18 2200  ceFEPIme (MAXIPIME) 2 g in sodium chloride 0.9 % 100 mL IVPB     2 g 200 mL/hr over 30 Minutes Intravenous  Once 04/09/18 2111 04/10/18 0109   04/09/18 2115  vancomycin (VANCOCIN) IVPB 750 mg/150 ml premix     750 mg 150 mL/hr over 60 Minutes Intravenous  Once 04/09/18 2111 04/10/18 0218   04/09/18 1028  ceFEPIme (MAXIPIME) 2 g injection    Note to Pharmacy:  [name]   : cabinet override      04/09/18 1028 04/09/18 2244   04/09/18 0700  ceFEPIme (MAXIPIME) 2 g in sodium chloride 0.9 % 100 mL IVPB     2 g 200 mL/hr over 30 Minutes Intravenous  Once 04/09/18 0654 04/09/18 1103   04/09/18 0700  metroNIDAZOLE (FLAGYL) IVPB 500 mg  Status:  Discontinued     500 mg 100 mL/hr over 60 Minutes Intravenous Every 8 hours 04/09/18 0654 04/12/18 1004   04/09/18 0700  vancomycin (VANCOCIN) IVPB 1000 mg/200 mL premix     1,000 mg 200 mL/hr over 60 Minutes Intravenous  Once 04/09/18 0654 04/09/18 1035        Objective:   Vitals:   04/11/18 1632 04/11/18 2031 04/12/18 0327 04/12/18 0957  BP: (!) 107/53 (!) 83/53 (!) 99/50 109/72  Pulse: (!) 120 (!) 104 95 86  Resp: 18  16 18 18   Temp: 98.9 F (37.2 C) 99.3 F (37.4 C) 98.8 F (37.1 C) 98.9 F (37.2 C)  TempSrc: Oral Oral Oral Oral  SpO2: 96% 92% 96% 97%  Weight:  65.4 kg    Height:        Wt Readings from Last 3 Encounters:  04/11/18 65.4 kg  03/10/18 63 kg  03/05/18 63 kg     Intake/Output Summary (Last 24 hours)  at 04/12/2018 1342 Last data filed at 04/12/2018 0817 Gross per 24 hour  Intake 520 ml  Output 1081 ml  Net -561 ml     Physical Exam  Awake Alert, Oriented X 3, No new F.N deficits, Normal affect Symmetrical Chest wall movement, Good air movement bilaterally, CTAB RRR,No Gallops,Rubs or new Murmurs, No Parasternal Heave +ve B.Sounds, Abd Soft, right upper quadrant tenderness, No rebound - guarding or rigidity. No Cyanosis, Clubbing or edema, No new Rash or bruise        Data Review:    CBC Recent Labs  Lab 04/09/18 0704 04/10/18 0608 04/10/18 0954 04/10/18 1919 04/11/18 0612 04/12/18 0508  WBC 11.9* 13.8* 13.7*  --  13.4* 25.3*  HGB 8.0* 6.0* 6.4* 7.6* 7.6* 8.5*  HCT 26.5* 19.6* 20.7* 24.1* 24.7* 26.7*  PLT 446* 293 305  --  306 290  MCV 98.9 95.1 95.0  --  96.1 92.7  MCH 29.9 29.1 29.4  --  29.6 29.5  MCHC 30.2 30.6 30.9  --  30.8 31.8  RDW 15.8* 16.1* 16.0*  --  16.3* 16.1*  LYMPHSABS 0.6*  --   --   --   --   --   MONOABS 0.1  --   --   --   --   --   EOSABS 0.0  --   --   --   --   --   BASOSABS 0.0  --   --   --   --   --     Chemistries  Recent Labs  Lab 04/09/18 0704 04/10/18 0608 04/11/18 0612 04/12/18 0508  NA 137 132* 136 137  K 4.2 3.2* 3.5 3.3*  CL 93* 96* 101 102  CO2 22 27 26 26   GLUCOSE 135* 105* 101* 84  BUN 58* 17 35* 21  CREATININE 11.75* 5.19* 6.32* 4.53*  CALCIUM 7.5* 6.8* 7.2* 7.3*  AST 48*  --   --  31  ALT 20  --   --  17  ALKPHOS 147*  --   --  103  BILITOT 0.5  --   --  0.6   ------------------------------------------------------------------------------------------------------------------ No results for input(s): CHOL, HDL, LDLCALC, TRIG, CHOLHDL, LDLDIRECT in the last 72 hours.  No results found for: HGBA1C ------------------------------------------------------------------------------------------------------------------ No results for input(s): TSH, T4TOTAL, T3FREE, THYROIDAB in the last 72 hours.  Invalid input(s):  FREET3 ------------------------------------------------------------------------------------------------------------------ Recent Labs    04/10/18 1142  VITAMINB12 679  FOLATE 4.5*  FERRITIN 2,373*  TIBC 132*  IRON 15*  RETICCTPCT 0.8    Coagulation profile Recent Labs  Lab 04/10/18 0608  INR 1.23    No results for input(s): DDIMER in the last 72 hours.  Cardiac Enzymes No results for input(s): CKMB, TROPONINI, MYOGLOBIN in the last 168 hours.  Invalid input(s): CK ------------------------------------------------------------------------------------------------------------------    Component Value Date/Time   BNP 141.1 (H) 03/14/2017 2200    Inpatient Medications  Scheduled Meds: . Chlorhexidine Gluconate Cloth  6 each Topical Q0600  . [START ON 04/14/2018] doxercalciferol  2 mcg Intravenous Q  Wed-HD  . feeding supplement (NEPRO CARB STEADY)  237 mL Oral BID BM  . feeding supplement (PRO-STAT SUGAR FREE 64)  30 mL Oral BID  . folic acid  1 mg Oral Daily  . heparin injection (subcutaneous)  5,000 Units Subcutaneous Q8H  . ondansetron (ZOFRAN) IV  4 mg Intravenous Once  . pantoprazole  40 mg Oral BID  . senna-docusate  2 tablet Oral BID  . [START ON 04/15/2018] Vitamin D (Ergocalciferol)  50,000 Units Oral Weekly   Continuous Infusions: . sodium chloride Stopped (04/09/18 1208)  . meropenem (MERREM) IV     PRN Meds:.sodium chloride, acetaminophen, oxyCODONE, polyethylene glycol  Micro Results Recent Results (from the past 240 hour(s))  Blood Culture (routine x 2)     Status: Abnormal   Collection Time: 04/09/18  7:05 AM  Result Value Ref Range Status   Specimen Description   Final    BLOOD RIGHT FOREARM Performed at [facility], [address] [place], [place] [address]    Special Requests   Final    BOTTLES DRAWN AEROBIC AND ANAEROBIC Blood Culture adequate volume Performed at [facility], [address] [place], [place]  [address]    Culture  Setup Time   Final    GRAM NEGATIVE RODS ANAEROBIC BOTTLE ONLY CRITICAL RESULT CALLED TO, READ BACK BY AND VERIFIED WITH: [place] [phone] FCP    Culture (A)  Final    BACTEROIDES FRAGILIS BETA LACTAMASE POSITIVE Performed at [facility], [place] [address] [place], [place] [address]    Report Status 04/12/2018 FINAL  Final  Blood Culture ID Panel (Reflexed)     Status: None   Collection Time: 04/09/18  7:05 AM  Result Value Ref Range Status   Enterococcus species NOT DETECTED NOT DETECTED Final   Listeria monocytogenes NOT DETECTED NOT DETECTED Final   Staphylococcus species NOT DETECTED NOT DETECTED Final   Staphylococcus aureus (BCID) NOT DETECTED NOT DETECTED Final   Streptococcus species NOT DETECTED NOT DETECTED Final   Streptococcus agalactiae NOT DETECTED NOT DETECTED Final   Streptococcus pneumoniae NOT DETECTED NOT DETECTED Final   Streptococcus pyogenes NOT DETECTED NOT DETECTED Final   Acinetobacter baumannii NOT DETECTED NOT DETECTED Final   Enterobacteriaceae species NOT DETECTED NOT DETECTED Final   Enterobacter cloacae complex NOT DETECTED NOT DETECTED Final   Escherichia coli NOT DETECTED NOT DETECTED Final   Klebsiella oxytoca NOT DETECTED NOT DETECTED Final   Klebsiella pneumoniae NOT DETECTED NOT DETECTED Final   Proteus species NOT DETECTED NOT DETECTED Final   Serratia marcescens NOT DETECTED NOT DETECTED Final   Haemophilus influenzae NOT DETECTED NOT DETECTED Final   Neisseria meningitidis NOT DETECTED NOT DETECTED Final   Pseudomonas aeruginosa NOT DETECTED NOT DETECTED Final   Candida albicans NOT DETECTED NOT DETECTED Final   Candida glabrata NOT DETECTED NOT DETECTED Final   Candida krusei NOT DETECTED NOT DETECTED Final   Candida parapsilosis NOT DETECTED NOT DETECTED Final   Candida tropicalis NOT DETECTED NOT DETECTED Final    Comment: Performed at [facility], [place] [address], [place], [place] [address]   Blood Culture (routine x 2)     Status: None (Preliminary result)   Collection Time: 04/09/18  7:13 AM  Result Value Ref Range Status   Specimen Description   Final    BLOOD RIGHT ARM Performed at [facility], [address] [place], [place] [address]  Special Requests   Final    BOTTLES DRAWN AEROBIC AND ANAEROBIC Blood Culture adequate volume Performed at [facility], [address] [place], [place] [address]    Culture  Setup Time   Final    GRAM NEGATIVE RODS AEROBIC BOTTLE ONLY CRITICAL RESULT CALLED TO, READ BACK BY AND VERIFIED WITH: [place] [phone] FCP    Culture   Final    NO GROWTH 3 DAYS Performed at [facility], [place] [address], [place], [place] [address]    Report Status PENDING  Incomplete  Blood Culture ID Panel (Reflexed)     Status: None   Collection Time: 04/09/18  7:13 AM  Result Value Ref Range Status   Enterococcus species NOT DETECTED NOT DETECTED Final    Comment: CRITICAL RESULT CALLED TO, READ BACK BY AND VERIFIED WITH: PHARM D JAMES [place] [phone] FCP    Listeria monocytogenes NOT DETECTED NOT DETECTED Final   Staphylococcus species NOT DETECTED NOT DETECTED Final   Staphylococcus aureus (BCID) NOT DETECTED NOT DETECTED Final   Streptococcus species NOT DETECTED NOT DETECTED Final   Streptococcus agalactiae NOT DETECTED NOT DETECTED Final   Streptococcus pneumoniae NOT DETECTED NOT DETECTED Final   Streptococcus pyogenes NOT DETECTED NOT DETECTED Final   Acinetobacter baumannii NOT DETECTED NOT DETECTED Final   Enterobacteriaceae species NOT DETECTED NOT DETECTED Final   Enterobacter cloacae complex NOT DETECTED NOT DETECTED Final   Escherichia coli NOT DETECTED NOT DETECTED Final   Klebsiella oxytoca NOT DETECTED NOT DETECTED Final   Klebsiella pneumoniae NOT DETECTED NOT DETECTED Final   Proteus species NOT DETECTED NOT DETECTED Final   Serratia marcescens NOT DETECTED NOT DETECTED Final    Haemophilus influenzae NOT DETECTED NOT DETECTED Final   Neisseria meningitidis NOT DETECTED NOT DETECTED Final   Pseudomonas aeruginosa NOT DETECTED NOT DETECTED Final   Candida albicans NOT DETECTED NOT DETECTED Final   Candida glabrata NOT DETECTED NOT DETECTED Final   Candida krusei NOT DETECTED NOT DETECTED Final   Candida parapsilosis NOT DETECTED NOT DETECTED Final   Candida tropicalis NOT DETECTED NOT DETECTED Final    Comment: Performed at [facility], [place] [address], [place], [place] [address]    Radiology Reports Ct Chest Wo Contrast  Result Date: 04/02/2018 CLINICAL DATA:  71 year old female with recently diagnosed adenocarcinoma in the liver. Abdominal pain. Follow-up study. EXAM: CT CHEST WITHOUT CONTRAST TECHNIQUE: Multidetector CT imaging of the chest was performed following the standard protocol without IV contrast. COMPARISON:  CT the chest, abdomen and pelvis 08/07/2016. FINDINGS: Cardiovascular: Heart size is normal. There is no significant pericardial fluid, thickening or pericardial calcification. There is aortic atherosclerosis, as well as atherosclerosis of the great vessels of the mediastinum and the coronary arteries, including calcified atherosclerotic plaque in the left main and left anterior descending coronary arteries. Mediastinum/Nodes: No pathologically enlarged mediastinal or hilar lymph nodes. Please note that accurate exclusion of hilar adenopathy is limited on noncontrast CT scans. Esophagus is unremarkable in appearance. No axillary lymphadenopathy. Lungs/Pleura: Right-sided Bochdalek hernia containing only retroperitoneal fat. Diffuse bronchial wall thickening with moderate centrilobular and mild paraseptal emphysema. No acute consolidative airspace disease. No pleural effusions. 4 mm subpleural nodule in the periphery of the right lower lobe (axial image 106 of series 8). No other larger more suspicious appearing pulmonary nodules or masses are noted.  Upper Abdomen: 2 large nonobstructive calculi in the upper pole collecting system of left kidney measuring up to  7 mm. Aortic atherosclerosis. Numerous colonic diverticulae are noted in the region of the splenic flexure. Musculoskeletal: There are no aggressive appearing lytic or blastic lesions noted in the visualized portions of the skeleton. IMPRESSION: 1. 4 mm subpleural nodule in the periphery of the right lower lobe. This is nonspecific, but strongly favored to represent a benign subpleural lymph node. Attention on follow-up studies is recommended to exclude the possibility of metastatic disease. 2. Diffuse bronchial wall thickening with moderate centrilobular and mild paraseptal emphysema; imaging findings suggestive of underlying COPD. 3. Aortic atherosclerosis, in addition to left main and left anterior descending coronary artery disease. Assessment for potential risk factor modification, dietary therapy or pharmacologic therapy may be warranted, if clinically indicated. 4. Nonobstructive calculi in the upper pole collecting system of left kidney measuring up to 7 mm. Aortic Atherosclerosis (ICD10-I70.0) and Emphysema (ICD10-J43.9). Electronically Signed   By: Vinnie Langton M.D.   On: 04/02/2018 11:05   Ct Abdomen Pelvis W Contrast  Result Date: 04/09/2018 CLINICAL DATA:  Abdominal pain and tenderness. Adenocarcinoma of the liver characterized on biopsy 02/26/2018. Concern for primary cholangiocarcinoma. EXAM: CT ABDOMEN AND PELVIS WITH CONTRAST TECHNIQUE: Multidetector CT imaging of the abdomen and pelvis was performed using the standard protocol following bolus administration of intravenous contrast. CONTRAST:  52mL ISOVUE-300 IOPAMIDOL (ISOVUE-300) INJECTION 61% While interpreting the scan, no IV contrast was identified within the organs or vascular structures. CT tech Barbaraann Barthel) was asked to evaluate the patient in the emergency department as there is high suspicion of extravasation at  injection site. In the interval patient had extravasation of IV saline from the ER interventions. ER staff and MD were advised of concern for IV contrast extravasation and patient was monitored and evaluated per extravasation protocol. COMPARISON:  CT 03/04/2018 FINDINGS: Lower chest: Lung bases are clear. Hepatobiliary: Significant interval increase in size of the RIGHT hepatic lobe infiltrative mass which now extends into the LEFT hepatic lobe measuring 8.0 x 4.7 cm compared to 5.0 x 4.9 cm on 03/04/2018. Coarse calcification centrally and medially within the mass is unchanged. No IV contrast administered. Fullness in the porta hepatis likely represents adenopathy. Difficult to define on noncontrast exam. Pancreas: Pancreas is normal. No ductal dilatation. No pancreatic inflammation. Spleen: Normal spleen Adrenals/urinary tract: Adrenal glands and kidneys are normal. Nonobstructing calculi in the LEFT kidney the ureters and bladder normal. Stomach/Bowel: Stomach, small-bowel appendix and cecum normal. Multiple diverticula of the descending colon. Pericolonic inflammation along the ascending colon is slightly decreased. Extensive diverticulosis through the descending colon additionally. Rectum normal. Vascular/Lymphatic: Abdominal aorta is normal caliber with atherosclerotic calcification. There is no retroperitoneal or periportal lymphadenopathy. No pelvic lymphadenopathy. Reproductive: Uterus and ovaries normal. Other: No peritoneal metastasis Musculoskeletal: No aggressive osseous lesion IMPRESSION: 1. Significant interval increase in the central hepatic mass (biopsy-proven adenocarcinoma) in short 1 month interval with mass approximately doubling in size. 2. Fullness in the porta hepatis likely represents adenopathy. Difficult to define without sufficient IV contrast exam. 3. Some apparent improvement in inflammation the ascending colon. 4. Extensive diverticulosis atherosclerotic disease. Electronically Signed    By: Suzy Bouchard M.D.   On: 04/09/2018 09:17   Dg Chest Port 1 View  Result Date: 04/09/2018 CLINICAL DATA:  Shortness of breath.  Renal failure. EXAM: PORTABLE CHEST 1 VIEW COMPARISON:  April 01, 2018 chest CT and chest radiograph June 26, 2017 FINDINGS: There is mild interstitial thickening. No edema or consolidation. Heart is borderline enlarged with pulmonary vascularity normal. No adenopathy. No evident bone  lesions. IMPRESSION: Borderline cardiac enlargement. Interstitial mildly prominent, likely reflecting chronic inflammatory type change. No frank edema or consolidation evident. Electronically Signed   By: Mary Chapman III M.D.   On: 04/09/2018 07:11      Phillips Climes M.D on 04/12/2018 at 1:42 PM  Between 7am to 7pm - Pager - [phone]  After 7pm go to [email or web address] - password [facility]  [facility] -  Office  [phone]

## 2018-04-12 NOTE — Progress Notes (Addendum)
Pharmacy Antibiotic Note  Mary Chapman is a 72 y.o. female admitted on 04/09/2018 with sepsis and GNR bacteremia. Pharmacy has been consulted for cefepime dosing. Pt with history of ESRD   Plan: Cefepime 2gm IV QHD Adjusting for holiday HD schedule F/u renal plans, C&S, clinical status   Height: 5' 6.5" (168.9 cm) Weight: 144 lb 2.9 oz (65.4 kg) IBW/kg (Calculated) : 60.45  Temp (24hrs), Avg:99.3 F (37.4 C), Min:98.5 F (36.9 C), Max:99.9 F (37.7 C)  Recent Labs  Lab 04/09/18 0704 04/09/18 0706 04/09/18 0901 04/10/18 0608 04/10/18 0954 04/11/18 0612 04/12/18 0508  WBC 11.9*  --   --  13.8* 13.7* 13.4* 25.3*  CREATININE 11.75*  --   --  5.19*  --  6.32* 4.53*  LATICACIDVEN  --  6.38* 1.36  --   --   --   --     Estimated Creatinine Clearance: 10.7 mL/min (A) (by C-G formula based on SCr of 4.53 mg/dL (H)).    No Known Allergies  Antimicrobials this admission: Vanc 11/22>>11/24 Cefepime 11/22>> Flagyl 11/22>>  Dose adjustments this admission: N/A  Microbiology results: 11/22: Blood: GNR  Mary Chapman A. Levada Dy, PharmD, [place] Pager: [phone] Please utilize Amion for appropriate phone number to reach the unit pharmacist ([place])  Addendum:  Mary Chapman with MD who wishes to change to meropenem until cultures return. Will start meropenem 500mg  q24h for ESRD. D/C cefepime and flagyl (as meropenem covers anaerobes).   Mary Chapman A. Levada Dy, PharmD, [place] Pager: [phone] Please utilize Amion for appropriate phone number to reach the unit pharmacist ([place])     04/12/2018 7:57 AM

## 2018-04-12 NOTE — Progress Notes (Addendum)
[facility] Progress Note   Subjective: No C/Os. Tenderness RUQ guarding during exam. Issues with HD 11/24-HR 140s. HD stopped early.   Objective Vitals:   04/11/18 1600 04/11/18 1632 04/11/18 2031 04/12/18 0327  BP: (!) 89/63 (!) 107/53 (!) 83/53 (!) 99/50  Pulse: (!) 144 (!) 120 (!) 104 95  Resp:  18 16 18   Temp:  98.9 F (37.2 C) 99.3 F (37.4 C) 98.8 F (37.1 C)  TempSrc:  Oral Oral Oral  SpO2:  96% 92% 96%  Weight:   65.4 kg   Height:       Physical Exam General: Chronically ill appearing female in NAD Heart: A2,Z3 2/6 systolic M. Prominent neck veins, slight JVD at 30 degrees.  Lungs: Slightly decreases in bases otherwise CTAB Abdomen: Liver palpable 3-4 cm below ribs, tender to palpation. No ascites. Active BS. Extremities: No LE edema.  Dialysis Access: L AVF + bruit   Additional Objective Labs: Basic Metabolic Panel: Recent Labs  Lab 04/10/18 0608 04/11/18 0612 04/12/18 0508  NA 132* 136 137  K 3.2* 3.5 3.3*  CL 96* 101 102  CO2 27 26 26   GLUCOSE 105* 101* 84  BUN 17 35* 21  CREATININE 5.19* 6.32* 4.53*  CALCIUM 6.8* 7.2* 7.3*  PHOS  --  3.9  --    Liver Function Tests: Recent Labs  Lab 04/09/18 0704 04/11/18 0612 04/12/18 0508  AST 48*  --  31  ALT 20  --  17  ALKPHOS 147*  --  103  BILITOT 0.5  --  0.6  PROT 7.2  --  5.6*  ALBUMIN 2.3* 1.6* 1.5*   No results for input(s): LIPASE, AMYLASE in the last 168 hours. CBC: Recent Labs  Lab 04/09/18 0704 04/10/18 0608 04/10/18 0954 04/10/18 1919 04/11/18 0612 04/12/18 0508  WBC 11.9* 13.8* 13.7*  --  13.4* 25.3*  NEUTROABS 11.2*  --   --   --   --   --   HGB 8.0* 6.0* 6.4* 7.6* 7.6* 8.5*  HCT 26.5* 19.6* 20.7* 24.1* 24.7* 26.7*  MCV 98.9 95.1 95.0  --  96.1 92.7  PLT 446* 293 305  --  306 290   Blood Culture    Component Value Date/Time   SDES  04/09/2018 0713    BLOOD RIGHT ARM Performed at [facility], [address], [place], [place] [address]    [facility]  04/09/2018 [phone]    BOTTLES DRAWN AEROBIC AND ANAEROBIC Blood Culture adequate volume Performed at [facility], [address], [place], [place] [address]    CULT  04/09/2018 0713    NO GROWTH 3 DAYS Performed at [facility], [place] [address], [place], [place] [address]    REPTSTATUS PENDING 04/09/2018 4401    Cardiac Enzymes: No results for input(s): CKTOTAL, CKMB, CKMBINDEX, TROPONINI in the last 168 hours. CBG: No results for input(s): GLUCAP in the last 168 hours. Iron Studies:  Recent Labs    04/10/18 1142  IRON 15*  TIBC 132*  FERRITIN 2,373*   @lablastinr3 @ Studies/Results: No results found. Medications: . sodium chloride Stopped (04/09/18 1208)  . [START ON 04/16/2018] ceFEPime (MAXIPIME) IV    . ceFEPime (MAXIPIME) IV 2 g (04/11/18 1953)  . metronidazole 500 mg (04/12/18 0817)   . [START ON 04/14/2018] doxercalciferol  2 mcg Intravenous Q Wed-HD  . feeding supplement (PRO-STAT SUGAR FREE 64)  30 mL Oral BID  . folic acid  1 mg Oral  Daily  . heparin injection (subcutaneous)  5,000 Units Subcutaneous Q8H  . ondansetron (ZOFRAN) IV  4 mg Intravenous Once  . pantoprazole  40 mg Oral BID  . senna-docusate  2 tablet Oral BID  . [START ON 04/15/2018] Vitamin D (Ergocalciferol)  50,000 Units Oral Weekly     HP MWF 4h 400/800  62kg 2K/2.5Ca  L AVF Hep 4000 units IV initial  bolus 2000 units IV mid run -Hectorol 2 mcg IV q week  -Mircera 175mcg IV q 2 weeks (last 11/13) -Auryixa 2 tabs qac  Assessment/Plan: 1. Fever/chills. Code sepsis on adm. Unclear source ?Abd etiology. Blood cultures +GNR. On cefepime - per primary. WBC 25.3. Viral panel negative. Low grade temp over night.  2. ESRD - MWF via AVF. HD tomorrow on holiday schedule. K+ 3.3 use 4K bath. Hold heparin on HD.   3. Hypertension/volume - HD 11/24 Was supposed to run even per note but net UF 1081. Ran 3 hours. HR in 140s. BP soft today. Believe that she is dry. Run even  tomorrow-may require NS.  4. Anemia CKD/ABLA/Hx of GI bleed. - HGB 8.5 after 2 units PRBCs. S/P colonoscopy 03/05/18 showing severe diverticulosis throughout the colon, single ulcer cecum - biopsied. Tsat 11% Ferritin 2373. Next ESA due 81/84.  5. Metabolic bone disease - Ca 7.3 C Ca 9.2  Phos 3.9. Cont VDRA/Binder. 6. Nutrition.Ablumin 1.6. Low albumin 2/2 liver disease. Add prot supp 7. Adenocarcinoma of liver - new dx 02/2018. Abd CT showing sig increase in size of liver mass in 1 mo interval. Followed by Dr. Oretha Milch H. Brown NP-C 04/12/2018, 9:44 AM  [facility] [phone]  Pt seen, examined and agree w A/P as above.  [name] MD [employer] pager [phone]   04/12/2018, 12:05 PM

## 2018-04-12 NOTE — Care Management Important Message (Signed)
Important Message  Patient Details  Name: Mary Chapman MRN: [id number] Date of Birth: 09-09-1945   Medicare Important Message Given:  Yes    Erenest Rasher, RN 04/12/2018, 2:36 PM

## 2018-04-13 DIAGNOSIS — R7881 Bacteremia: Secondary | ICD-10-CM

## 2018-04-13 DIAGNOSIS — C221 Intrahepatic bile duct carcinoma: Secondary | ICD-10-CM

## 2018-04-13 DIAGNOSIS — Z01818 Encounter for other preprocedural examination: Secondary | ICD-10-CM

## 2018-04-13 LAB — COMPREHENSIVE METABOLIC PANEL
ALT: 17 U/L (ref 0–44)
ANION GAP: 9 (ref 5–15)
AST: 26 U/L (ref 15–41)
Albumin: 1.7 g/dL — ABNORMAL LOW (ref 3.5–5.0)
Alkaline Phosphatase: 123 U/L (ref 38–126)
BUN: 34 mg/dL — ABNORMAL HIGH (ref 8–23)
CHLORIDE: 102 mmol/L (ref 98–111)
CO2: 26 mmol/L (ref 22–32)
Calcium: 7.9 mg/dL — ABNORMAL LOW (ref 8.9–10.3)
Creatinine, Ser: 6.18 mg/dL — ABNORMAL HIGH (ref 0.44–1.00)
GFR calc Af Amer: 7 mL/min — ABNORMAL LOW (ref >60)
GFR calc non Af Amer: 6 mL/min — ABNORMAL LOW (ref >60)
Glucose, Bld: 70 mg/dL (ref 70–99)
Potassium: 3.5 mmol/L (ref 3.5–5.1)
SODIUM: 137 mmol/L (ref 135–145)
Total Bilirubin: 0.6 mg/dL (ref 0.3–1.2)
Total Protein: 5.8 g/dL — ABNORMAL LOW (ref 6.5–8.1)

## 2018-04-13 LAB — CBC
HCT: 27.8 % — ABNORMAL LOW (ref 36.0–46.0)
Hemoglobin: 8.6 g/dL — ABNORMAL LOW (ref 12.0–15.0)
MCH: 29.1 pg (ref 26.0–34.0)
MCHC: 30.9 g/dL (ref 30.0–36.0)
MCV: 93.9 fL (ref 80.0–100.0)
PLATELETS: 362 10*3/uL (ref 150–400)
RBC: 2.96 MIL/uL — AB (ref 3.87–5.11)
RDW: 16.3 % — ABNORMAL HIGH (ref 11.5–15.5)
WBC: 20.8 10*3/uL — ABNORMAL HIGH (ref 4.0–10.5)
nRBC: 0 % (ref 0.0–0.2)

## 2018-04-13 LAB — CULTURE, BLOOD (ROUTINE X 2): Special Requests: ADEQUATE

## 2018-04-13 MED ORDER — DARBEPOETIN ALFA 150 MCG/0.3ML IJ SOSY
150.0000 ug | PREFILLED_SYRINGE | Freq: Once | INTRAMUSCULAR | Status: DC
  Filled 2018-04-13: qty 0.3

## 2018-04-13 MED ORDER — DOXERCALCIFEROL 4 MCG/2ML IV SOLN
INTRAVENOUS | Status: AC
  Filled 2018-04-13: qty 2

## 2018-04-13 NOTE — Progress Notes (Signed)
Mary Chapman   DOB:[birth year]   XB#:[id number]   ZHY#:[id number]  Oncology follow-up note  Subjective: Patient is known to me, I saw her in my office for her recently diagnosed cholangiocarcinoma.  She was admitted for fever and sepsis.  I saw her in the dialysis unit today, she feels better today, afebrile with stable vital signs.   Objective:  Vitals:   04/13/18 1430 04/13/18 1500  BP: 128/72 137/72  Pulse: 86 83  Resp: 17   Temp:    SpO2:      Body mass index is 22.26 kg/m.  Intake/Output Summary (Last 24 hours) at 04/13/2018 1521 Last data filed at 04/13/2018 0900 Gross per 24 hour  Intake 240 ml  Output 0 ml  Net 240 ml     Sclerae unicteric  Oropharynx clear  No peripheral adenopathy  Lungs clear -- no rales or rhonchi  Heart regular rate and rhythm  Abdomen benign  MSK no focal spinal tenderness, no peripheral edema  Neuro nonfocal   CBG (last 3)  No results for input(s): GLUCAP in the last 72 hours.   Labs:  Lab Results  Component Value Date   WBC 20.8 (H) 04/13/2018   HGB 8.6 (L) 04/13/2018   HCT 27.8 (L) 04/13/2018   MCV 93.9 04/13/2018   PLT 362 04/13/2018   NEUTROABS 11.2 (H) 04/09/2018    @LASTCHEMISTRY @  Urine Studies No results for input(s): UHGB, CRYS in the last 72 hours.  Invalid input(s): UACOL, UAPR, USPG, UPH, UTP, UGL, UKET, UBIL, UNIT, UROB, [place], UEPI, UWBC, Duwayne Heck [place], [place]  Basic Metabolic Panel: Recent Labs  Lab 04/09/18 0704 04/10/18 0608 04/11/18 0612 04/12/18 0508 04/13/18 0641  NA 137 132* 136 137 137  K 4.2 3.2* 3.5 3.3* 3.5  CL 93* 96* 101 102 102  CO2 22 27 26 26 26   GLUCOSE 135* 105* 101* 84 70  BUN 58* 17 35* 21 34*  CREATININE 11.75* 5.19* 6.32* 4.53* 6.18*  CALCIUM 7.5* 6.8* 7.2* 7.3* 7.9*  PHOS  --   --  3.9  --   --    GFR Estimated Creatinine Clearance: 7.9 mL/min (A) (by C-G formula based on SCr of 6.18 mg/dL (H)). Liver Function Tests: Recent Labs  Lab 04/09/18 0704 04/11/18 0612  04/12/18 0508 04/13/18 0641  AST 48*  --  31 26  ALT 20  --  17 17  ALKPHOS 147*  --  103 123  BILITOT 0.5  --  0.6 0.6  PROT 7.2  --  5.6* 5.8*  ALBUMIN 2.3* 1.6* 1.5* 1.7*   No results for input(s): LIPASE, AMYLASE in the last 168 hours. No results for input(s): AMMONIA in the last 168 hours. Coagulation profile Recent Labs  Lab 04/10/18 0608  INR 1.23    CBC: Recent Labs  Lab 04/09/18 0704 04/10/18 0608 04/10/18 0954 04/10/18 1919 04/11/18 0612 04/12/18 0508 04/13/18 0641  WBC 11.9* 13.8* 13.7*  --  13.4* 25.3* 20.8*  NEUTROABS 11.2*  --   --   --   --   --   --   HGB 8.0* 6.0* 6.4* 7.6* 7.6* 8.5* 8.6*  HCT 26.5* 19.6* 20.7* 24.1* 24.7* 26.7* 27.8*  MCV 98.9 95.1 95.0  --  96.1 92.7 93.9  PLT 446* 293 305  --  306 290 362   Cardiac Enzymes: No results for input(s): CKTOTAL, CKMB, CKMBINDEX, TROPONINI in the last 168 hours. BNP: Invalid input(s): POCBNP CBG: No results for input(s): GLUCAP in the last 168  hours. D-Dimer No results for input(s): DDIMER in the last 72 hours. Hgb A1c No results for input(s): HGBA1C in the last 72 hours. Lipid Profile No results for input(s): CHOL, HDL, LDLCALC, TRIG, CHOLHDL, LDLDIRECT in the last 72 hours. Thyroid function studies No results for input(s): TSH, T4TOTAL, T3FREE, THYROIDAB in the last 72 hours.  Invalid input(s): FREET3 Anemia work up No results for input(s): VITAMINB12, FOLATE, FERRITIN, TIBC, IRON, RETICCTPCT in the last 72 hours. Microbiology Recent Results (from the past 240 hour(s))  Blood Culture (routine x 2)     Status: Abnormal   Collection Time: 04/09/18  7:05 AM  Result Value Ref Range Status   Specimen Description   Final    BLOOD RIGHT FOREARM Performed at [facility], [address] [place], [place] [address]    Special Requests   Final    BOTTLES DRAWN AEROBIC AND ANAEROBIC Blood Culture adequate volume Performed at [facility], [address] [place], [place]  [address]    Culture  Setup Time   Final    GRAM NEGATIVE RODS ANAEROBIC BOTTLE ONLY CRITICAL RESULT CALLED TO, READ BACK BY AND VERIFIED WITH: [place] [phone] FCP    Culture (A)  Final    BACTEROIDES FRAGILIS BETA LACTAMASE POSITIVE Performed at [facility], [place] [address] [place], [place] [address]    Report Status 04/12/2018 FINAL  Final  Blood Culture ID Panel (Reflexed)     Status: None   Collection Time: 04/09/18  7:05 AM  Result Value Ref Range Status   Enterococcus species NOT DETECTED NOT DETECTED Final   Listeria monocytogenes NOT DETECTED NOT DETECTED Final   Staphylococcus species NOT DETECTED NOT DETECTED Final   Staphylococcus aureus (BCID) NOT DETECTED NOT DETECTED Final   Streptococcus species NOT DETECTED NOT DETECTED Final   Streptococcus agalactiae NOT DETECTED NOT DETECTED Final   Streptococcus pneumoniae NOT DETECTED NOT DETECTED Final   Streptococcus pyogenes NOT DETECTED NOT DETECTED Final   Acinetobacter baumannii NOT DETECTED NOT DETECTED Final   Enterobacteriaceae species NOT DETECTED NOT DETECTED Final   Enterobacter cloacae complex NOT DETECTED NOT DETECTED Final   Escherichia coli NOT DETECTED NOT DETECTED Final   Klebsiella oxytoca NOT DETECTED NOT DETECTED Final   Klebsiella pneumoniae NOT DETECTED NOT DETECTED Final   Proteus species NOT DETECTED NOT DETECTED Final   Serratia marcescens NOT DETECTED NOT DETECTED Final   Haemophilus influenzae NOT DETECTED NOT DETECTED Final   Neisseria meningitidis NOT DETECTED NOT DETECTED Final   Pseudomonas aeruginosa NOT DETECTED NOT DETECTED Final   Candida albicans NOT DETECTED NOT DETECTED Final   Candida glabrata NOT DETECTED NOT DETECTED Final   Candida krusei NOT DETECTED NOT DETECTED Final   Candida parapsilosis NOT DETECTED NOT DETECTED Final   Candida tropicalis NOT DETECTED NOT DETECTED Final    Comment: Performed at [facility], [place] [address], [place], [place] [address]   Blood Culture (routine x 2)     Status: Abnormal   Collection Time: 04/09/18  7:13 AM  Result Value Ref Range Status   Specimen Description   Final    BLOOD RIGHT ARM Performed at [facility], [address] [place], [place] [address]    Special Requests   Final    BOTTLES DRAWN AEROBIC AND ANAEROBIC Blood Culture adequate volume Performed at [facility], [address] [place], [place] [address]    Culture  Setup Time   Final    GRAM NEGATIVE RODS AEROBIC BOTTLE ONLY CRITICAL RESULT CALLED TO, READ BACK BY AND VERIFIED WITH: [place] XFGHWEX [phone] [id number] FCP    Culture (A)  Final    BACTEROIDES FRAGILIS BETA LACTAMASE POSITIVE Performed at [facility], [place] [address], [place], [place] [address]    Report Status 04/13/2018 FINAL  Final  Blood Culture ID Panel (Reflexed)     Status: None   Collection Time: 04/09/18  7:13 AM  Result Value Ref Range Status   Enterococcus species NOT DETECTED NOT DETECTED Final    Comment: CRITICAL RESULT CALLED TO, READ BACK BY AND VERIFIED WITH: PHARM D JAMES LEDFORD [phone] FCP    Listeria monocytogenes NOT DETECTED NOT DETECTED Final   Staphylococcus species NOT DETECTED NOT DETECTED Final   Staphylococcus aureus (BCID) NOT DETECTED NOT DETECTED Final   Streptococcus species NOT DETECTED NOT DETECTED Final   Streptococcus agalactiae NOT DETECTED NOT DETECTED Final   Streptococcus pneumoniae NOT DETECTED NOT DETECTED Final   Streptococcus pyogenes NOT DETECTED NOT DETECTED Final   Acinetobacter baumannii NOT DETECTED NOT DETECTED Final   Enterobacteriaceae species NOT DETECTED NOT DETECTED Final   Enterobacter cloacae complex NOT DETECTED NOT DETECTED Final   Escherichia coli NOT DETECTED NOT DETECTED Final   Klebsiella oxytoca NOT DETECTED NOT DETECTED Final   Klebsiella pneumoniae NOT DETECTED NOT DETECTED Final   Proteus species NOT DETECTED NOT DETECTED Final   Serratia marcescens NOT DETECTED NOT  DETECTED Final   Haemophilus influenzae NOT DETECTED NOT DETECTED Final   Neisseria meningitidis NOT DETECTED NOT DETECTED Final   Pseudomonas aeruginosa NOT DETECTED NOT DETECTED Final   Candida albicans NOT DETECTED NOT DETECTED Final   Candida glabrata NOT DETECTED NOT DETECTED Final   Candida krusei NOT DETECTED NOT DETECTED Final   Candida parapsilosis NOT DETECTED NOT DETECTED Final   Candida tropicalis NOT DETECTED NOT DETECTED Final    Comment: Performed at [facility], [place] [address], [place],  [address]      Studies:  Dg Chest 2 View  Result Date: 04/12/2018 CLINICAL DATA:  Cough.  Nausea, vomiting, and diarrhea. EXAM: CHEST - 2 VIEW COMPARISON:  04/09/2018 FINDINGS: Borderline enlargement of the cardiopericardial silhouette, without edema. The lungs appear otherwise clear. No blunting of the costophrenic angles. IMPRESSION: 1. Borderline enlargement of the cardiopericardial silhouette. Otherwise, no significant abnormalities are observed. Electronically Signed   By: Van Clines M.D.   On: 04/12/2018 20:17   US Abdomen Limited Ruq  Result Date: 04/12/2018 CLINICAL DATA:  Initial evaluation for cholangiocarcinoma. EXAM: ULTRASOUND ABDOMEN LIMITED RIGHT UPPER QUADRANT COMPARISON:  Prior CT from 04/09/2018. FINDINGS: Gallbladder: Not visualized. Common bile duct: Diameter: 4 mm Liver: Liver demonstrates a heterogeneous echotexture. Multiple heterogeneous irregular hypoechoic masses present within the central aspect of the liver, consistent with biopsy proven adenocarcinoma. These measure 3.3 x 2.7 x 3.0 cm, 2.4 x 1.6 x 1.6 cm, and 4.0 x 3.6 x 2.4 cm. Although not well delineated by ultrasound, these are suspected to be contiguous with 1 another. Portal vein is patent on color Doppler imaging with normal direction of blood flow towards the liver. Possible trace free fluid within the adjacent right upper quadrant. Increased echogenicity within the visualized right  kidney, suggesting medical renal disease. IMPRESSION: 1. Multifocal intrahepatic mass, consistent with biopsy proven adenocarcinoma. This is suspected be little interval changed relative to recent CT from 3 days prior, better evaluated on previous exam.  2. Main portal vein remains patent with no portal venous invasion by sonography. 3. No biliary dilatation. Electronically Signed   By: Jeannine Boga M.D.   On: 04/12/2018 17:48    Assessment: 72 y.o. with PMH of end-stage renal disease on hemodialysis, recent diagnosed cholangiocarcinoma, untreated, presented with fever and sepsis.  1. Sepsis due to Bacteroides fragilis bacteremia 2.  Recently diagnosed cholangiocarcinoma, pending surgery 3.  Anemia of chronic disease, secondary to end-stage renal disease and mal;ignancy, s/p blood transfusion  4. HTN   Plan:  -Patient is on Unasyn, overall feels much better today.  She is afebrile, blood culture has been repeated. -I reviewed her repeated CT scan with patient, unfortunately her tumor in the liver has significant increase in size, no other metastasis. -She has been evaluated by surgeon Dr. Barry Dienes for her plantar carcinoma, plan is to have surgical resection.  She is awaiting for cardiac clearance.  I called the cardiology service this morning, and appreciate their input.  Patient would have a stress test after discharge.  Hopefully she will have liver surgery soon.  No chemotherapy or other cancer treatment is planned for now for surgery. -Dr. Barry Dienes or me or see her back before surgery, may consider a repeated CT chest to rule out metastasis before surgery. -I agree with blood transfusion if needed, to keep Hb >8.0. In general, will avoid EPO due to the black box warning of stimulating tumor gross in cancer patient.  -I will f/u as needed.    Truitt Merle, MD 04/13/2018  3:21 PM

## 2018-04-13 NOTE — Progress Notes (Signed)
PROGRESS NOTE                                                                                                                                                                                                             Patient Demographics:    Mary Chapman, is a 72 y.o. female, DOB - [birth year], IRC:[id number]  Admit date - 04/09/2018   Admitting Physician Guilford Shi, MD  Outpatient Primary MD for the patient is Glendon Axe, MD  LOS - 4   Chief Complaint  Patient presents with  . Fever  . Emesis       Brief Narrative    72 year old female with past medical history of end-stage renal disease on hemodialysis Monday Wednesday Friday, recent diagnosis of cholangiocarcinoma, started to follow with Dr. Burr Medico, she presents with fever and chills, her work-up significant for steroid is for Achilles bacteremia, likely source would be her cholangiocarcinoma, as her work-up significant for anemia, no evidence of significant GI bleed, she required 2 units PRBC transfusion, her hemoglobin remained stable .   Subjective:    Mary Chapman today she is feeling weak, have poor appetite, abdominal pain remains the same with no change, mild nausea, no vomiting, no fever   Assessment  & Plan :    Principal Problem:   Sepsis ([place]) Active Problems:   Anasarca   Tobacco abuse   Essential hypertension   ESRD on dialysis ([place])  Sepsis due to Bacteroides fragilis bacteremia -Sepsis present on admission as  presents with fever, leukocytosis, elevated lactic acid. -Secondary to Bacteroides fragilis to uremia,this is most likely related to abdominal source from her cholangiocarcinoma, I have discussed with oncology, I will evaluate her. -Upper quadrant ultrasound remains with significant thin portal vein, with no invasion by tumor, as well no biliary duct dilation, as well her LFTs remains within normal limits . -[place]iley on cefepime, currently transitioned to Unasyn  after discussion with Dr. Lucianne Lei down from ID -Initially leukocytosis is back to 25K, down to 20 K today, follow on repeat blood cultures, so far remain negative, afebrile over last 24 hours .  Nausea and vomiting - Improved , initially with oral intake, currently diminished  Cholangiocarcinoma -she followes With Dr. Burr Medico, patient reports she did follow with Dr. Barry Dienes about surgery, is awaiting cardiology clearance as an outpatient CT imaging showing almost doubling  in her tumor size within 1 month.    Uncontrolled hypertension -Continue to hold meds as blood pressure remains soft  Anemia of chronic kidney disease -No signs of overt GI bleed, but recent endoscopy/colonoscopy significant for gastritis, duodenitis, and cecal ulcer, overall no evidence of significant GI bleed, she was transfused 2 units PRBC so far with good response  -Low folic acid, started on supplements -No signs of GI bleed  Moderate protein calorie malnutrition -Low albumin, appetite is good, started on Nepro  Tobacco use disorder  End-stage renal disease  - on Dialysis, renal consulted    Code Status : full  Family Communication  : Daughter at bedside   Disposition Plan  : home when stable  Consults  :  Renal  Procedures  : none  DVT Prophylaxis  :  [place] heparin   Lab Results  Component Value Date   PLT 362 04/13/2018    Antibiotics  :    Anti-infectives (From admission, onward)   Start     Dose/Rate Route Frequency Ordered Stop   04/16/18 1200  ceFEPIme (MAXIPIME) 2 g in sodium chloride 0.9 % 100 mL IVPB  Status:  Discontinued     2 g 200 mL/hr over 30 Minutes Intravenous Every M-W-F (Hemodialysis) 04/11/18 1610 04/12/18 1004   04/12/18 2000  ceFEPIme (MAXIPIME) 2 g in sodium chloride 0.9 % 100 mL IVPB  Status:  Discontinued     2 g 200 mL/hr over 30 Minutes Intravenous Every M-W-F (2000) 04/09/18 0935 04/11/18 1610   04/12/18 1430  Ampicillin-Sulbactam (UNASYN) 3 g in sodium chloride 0.9 %  100 mL IVPB     3 g 200 mL/hr over 30 Minutes Intravenous Every 24 hours 04/12/18 1423     04/12/18 1400  meropenem (MERREM) 500 mg in sodium chloride 0.9 % 100 mL IVPB  Status:  Discontinued     500 mg 200 mL/hr over 30 Minutes Intravenous Every 24 hours 04/12/18 1004 04/12/18 1401   04/12/18 1200  vancomycin (VANCOCIN) IVPB 750 mg/150 ml premix  Status:  Discontinued     750 mg 150 mL/hr over 60 Minutes Intravenous Every M-W-F (Hemodialysis) 04/09/18 0935 04/11/18 0726   04/11/18 1800  ceFEPIme (MAXIPIME) 2 g in sodium chloride 0.9 % 100 mL IVPB  Status:  Discontinued     2 g 200 mL/hr over 30 Minutes Intravenous Every T-Th-Sa (Hemodialysis) 04/11/18 1610 04/12/18 1004   04/09/18 2200  ceFEPIme (MAXIPIME) 2 g in sodium chloride 0.9 % 100 mL IVPB     2 g 200 mL/hr over 30 Minutes Intravenous  Once 04/09/18 2111 04/10/18 0109   04/09/18 2115  vancomycin (VANCOCIN) IVPB 750 mg/150 ml premix     750 mg 150 mL/hr over 60 Minutes Intravenous  Once 04/09/18 2111 04/10/18 0218   04/09/18 1028  ceFEPIme (MAXIPIME) 2 g injection    Note to Pharmacy:  [name]   : cabinet override      04/09/18 1028 04/09/18 2244   04/09/18 0700  ceFEPIme (MAXIPIME) 2 g in sodium chloride 0.9 % 100 mL IVPB     2 g 200 mL/hr over 30 Minutes Intravenous  Once 04/09/18 0654 04/09/18 1103   04/09/18 0700  metroNIDAZOLE (FLAGYL) IVPB 500 mg  Status:  Discontinued     500 mg 100 mL/hr over 60 Minutes Intravenous Every 8 hours 04/09/18 0654 04/12/18 1004   04/09/18 0700  vancomycin (VANCOCIN) IVPB 1000 mg/200 mL premix     1,000 mg 200 mL/hr over  60 Minutes Intravenous  Once 04/09/18 0654 04/09/18 1035        Objective:   Vitals:   04/12/18 1801 04/12/18 2131 04/13/18 0434 04/13/18 0952  BP: 133/80 122/79 117/72 122/75  Pulse: 84 90 93 91  Resp: 18 18 18 18   Temp: 98.3 F (36.8 C) 98.5 F (36.9 C) 98.6 F (37 C) 98.1 F (36.7 C)  TempSrc: Oral Oral Oral Oral  SpO2: 99% 97% 100% 96%  Weight:  65.4  kg    Height:        Wt Readings from Last 3 Encounters:  04/12/18 65.4 kg  03/10/18 63 kg  03/05/18 63 kg     Intake/Output Summary (Last 24 hours) at 04/13/2018 1216 Last data filed at 04/13/2018 0900 Gross per 24 hour  Intake 240 ml  Output 0 ml  Net 240 ml     Physical Exam  Awake Alert, Oriented X 3, No new F.N deficits, Normal affect Symmetrical Chest wall movement, Good air movement bilaterally, CTAB RRR,No Gallops,Rubs or new Murmurs, No Parasternal Heave +ve B.Sounds, Abd Soft, right upper quadrant tenderness, No rebound - guarding or rigidity. No Cyanosis, Clubbing or edema, No new Rash or bruise     Data Review:    CBC Recent Labs  Lab 04/09/18 0704 04/10/18 0608 04/10/18 0954 04/10/18 1919 04/11/18 0612 04/12/18 0508 04/13/18 0641  WBC 11.9* 13.8* 13.7*  --  13.4* 25.3* 20.8*  HGB 8.0* 6.0* 6.4* 7.6* 7.6* 8.5* 8.6*  HCT 26.5* 19.6* 20.7* 24.1* 24.7* 26.7* 27.8*  PLT 446* 293 305  --  306 290 362  MCV 98.9 95.1 95.0  --  96.1 92.7 93.9  MCH 29.9 29.1 29.4  --  29.6 29.5 29.1  MCHC 30.2 30.6 30.9  --  30.8 31.8 30.9  RDW 15.8* 16.1* 16.0*  --  16.3* 16.1* 16.3*  LYMPHSABS 0.6*  --   --   --   --   --   --   MONOABS 0.1  --   --   --   --   --   --   EOSABS 0.0  --   --   --   --   --   --   BASOSABS 0.0  --   --   --   --   --   --     Chemistries  Recent Labs  Lab 04/09/18 0704 04/10/18 0608 04/11/18 0612 04/12/18 0508 04/13/18 0641  NA 137 132* 136 137 137  K 4.2 3.2* 3.5 3.3* 3.5  CL 93* 96* 101 102 102  CO2 22 27 26 26 26   GLUCOSE 135* 105* 101* 84 70  BUN 58* 17 35* 21 34*  CREATININE 11.75* 5.19* 6.32* 4.53* 6.18*  CALCIUM 7.5* 6.8* 7.2* 7.3* 7.9*  AST 48*  --   --  31 26  ALT 20  --   --  17 17  ALKPHOS 147*  --   --  103 123  BILITOT 0.5  --   --  0.6 0.6   ------------------------------------------------------------------------------------------------------------------ No results for input(s): CHOL, HDL, LDLCALC, TRIG,  CHOLHDL, LDLDIRECT in the last 72 hours.  No results found for: HGBA1C ------------------------------------------------------------------------------------------------------------------ No results for input(s): TSH, T4TOTAL, T3FREE, THYROIDAB in the last 72 hours.  Invalid input(s): FREET3 ------------------------------------------------------------------------------------------------------------------ No results for input(s): VITAMINB12, FOLATE, FERRITIN, TIBC, IRON, RETICCTPCT in the last 72 hours.  Coagulation profile Recent Labs  Lab 04/10/18 0608  INR 1.23    No results for input(s): DDIMER  in the last 72 hours.  Cardiac Enzymes No results for input(s): CKMB, TROPONINI, MYOGLOBIN in the last 168 hours.  Invalid input(s): CK ------------------------------------------------------------------------------------------------------------------    Component Value Date/Time   BNP 141.1 (H) 03/14/2017 2200    Inpatient Medications  Scheduled Meds: . Chlorhexidine Gluconate Cloth  6 each Topical Q0600  . darbepoetin (ARANESP) injection - DIALYSIS  150 mcg Intravenous Once  . [START ON 04/14/2018] doxercalciferol  2 mcg Intravenous Q Wed-HD  . feeding supplement (NEPRO CARB STEADY)  237 mL Oral BID BM  . feeding supplement (PRO-STAT SUGAR FREE 64)  30 mL Oral BID  . folic acid  1 mg Oral Daily  . heparin injection (subcutaneous)  5,000 Units Subcutaneous Q8H  . ondansetron (ZOFRAN) IV  4 mg Intravenous Once  . pantoprazole  40 mg Oral BID  . senna-docusate  2 tablet Oral BID  . [START ON 04/15/2018] Vitamin D (Ergocalciferol)  50,000 Units Oral Weekly   Continuous Infusions: . sodium chloride Stopped (04/09/18 1208)  . ampicillin-sulbactam (UNASYN) IV Stopped (04/12/18 2228)   PRN Meds:.sodium chloride, acetaminophen, oxyCODONE, polyethylene glycol  Micro Results Recent Results (from the past 240 hour(s))  Blood Culture (routine x 2)     Status: Abnormal   Collection  Time: 04/09/18  7:05 AM  Result Value Ref Range Status   Specimen Description   Final    BLOOD RIGHT FOREARM Performed at [facility], [address] [place], [place] [address]    Special Requests   Final    BOTTLES DRAWN AEROBIC AND ANAEROBIC Blood Culture adequate volume Performed at [facility], [address] [place], [place] [address]    Culture  Setup Time   Final    GRAM NEGATIVE RODS ANAEROBIC BOTTLE ONLY CRITICAL RESULT CALLED TO, READ BACK BY AND VERIFIED WITH: [place] [phone] FCP    Culture (A)  Final    BACTEROIDES FRAGILIS BETA LACTAMASE POSITIVE Performed at [facility], [place] [address], [place], [place] [address]    Report Status 04/12/2018 FINAL  Final  Blood Culture ID Panel (Reflexed)     Status: None   Collection Time: 04/09/18  7:05 AM  Result Value Ref Range Status   Enterococcus species NOT DETECTED NOT DETECTED Final   Listeria monocytogenes NOT DETECTED NOT DETECTED Final   Staphylococcus species NOT DETECTED NOT DETECTED Final   Staphylococcus aureus (BCID) NOT DETECTED NOT DETECTED Final   Streptococcus species NOT DETECTED NOT DETECTED Final   Streptococcus agalactiae NOT DETECTED NOT DETECTED Final   Streptococcus pneumoniae NOT DETECTED NOT DETECTED Final   Streptococcus pyogenes NOT DETECTED NOT DETECTED Final   Acinetobacter baumannii NOT DETECTED NOT DETECTED Final   Enterobacteriaceae species NOT DETECTED NOT DETECTED Final   Enterobacter cloacae complex NOT DETECTED NOT DETECTED Final   Escherichia coli NOT DETECTED NOT DETECTED Final   Klebsiella oxytoca NOT DETECTED NOT DETECTED Final   Klebsiella pneumoniae NOT DETECTED NOT DETECTED Final   Proteus species NOT DETECTED NOT DETECTED Final   Serratia marcescens NOT DETECTED NOT DETECTED Final   Haemophilus influenzae NOT DETECTED NOT DETECTED Final   Neisseria meningitidis NOT DETECTED NOT DETECTED Final   Pseudomonas aeruginosa NOT DETECTED  NOT DETECTED Final   Candida albicans NOT DETECTED NOT DETECTED Final   Candida glabrata NOT DETECTED NOT DETECTED Final   Candida krusei NOT DETECTED NOT DETECTED Final   Candida parapsilosis NOT DETECTED NOT DETECTED Final   Candida tropicalis  NOT DETECTED NOT DETECTED Final    Comment: Performed at [facility], [place] [address] [place], [place] [address]  Blood Culture (routine x 2)     Status: Abnormal   Collection Time: 04/09/18  7:13 AM  Result Value Ref Range Status   Specimen Description   Final    BLOOD RIGHT ARM Performed at [facility], [address] [name], [place] [address]    Special Requests   Final    BOTTLES DRAWN AEROBIC AND ANAEROBIC Blood Culture adequate volume Performed at [facility], [address] [place], [place] [address]    Culture  Setup Time   Final    GRAM NEGATIVE RODS AEROBIC BOTTLE ONLY CRITICAL RESULT CALLED TO, READ BACK BY AND VERIFIED WITH: [place] URKYHCW [phone] FCP    Culture (A)  Final    BACTEROIDES FRAGILIS BETA LACTAMASE POSITIVE Performed at [facility], [place] [address] [place], [place] [address]    Report Status 04/13/2018 FINAL  Final  Blood Culture ID Panel (Reflexed)     Status: None   Collection Time: 04/09/18  7:13 AM  Result Value Ref Range Status   Enterococcus species NOT DETECTED NOT DETECTED Final    Comment: CRITICAL RESULT CALLED TO, READ BACK BY AND VERIFIED WITH: PHARM D JAMES [place] [phone] FCP    Listeria monocytogenes NOT DETECTED NOT DETECTED Final   Staphylococcus species NOT DETECTED NOT DETECTED Final   Staphylococcus aureus (BCID) NOT DETECTED NOT DETECTED Final   Streptococcus species NOT DETECTED NOT DETECTED Final   Streptococcus agalactiae NOT DETECTED NOT DETECTED Final   Streptococcus pneumoniae NOT DETECTED NOT DETECTED Final   Streptococcus pyogenes NOT DETECTED NOT DETECTED Final   Acinetobacter baumannii NOT DETECTED NOT DETECTED Final    Enterobacteriaceae species NOT DETECTED NOT DETECTED Final   Enterobacter cloacae complex NOT DETECTED NOT DETECTED Final   Escherichia coli NOT DETECTED NOT DETECTED Final   Klebsiella oxytoca NOT DETECTED NOT DETECTED Final   Klebsiella pneumoniae NOT DETECTED NOT DETECTED Final   Proteus species NOT DETECTED NOT DETECTED Final   Serratia marcescens NOT DETECTED NOT DETECTED Final   Haemophilus influenzae NOT DETECTED NOT DETECTED Final   Neisseria meningitidis NOT DETECTED NOT DETECTED Final   Pseudomonas aeruginosa NOT DETECTED NOT DETECTED Final   Candida albicans NOT DETECTED NOT DETECTED Final   Candida glabrata NOT DETECTED NOT DETECTED Final   Candida krusei NOT DETECTED NOT DETECTED Final   Candida parapsilosis NOT DETECTED NOT DETECTED Final   Candida tropicalis NOT DETECTED NOT DETECTED Final    Comment: Performed at [facility], [place] [address] [place], [place] [address]    Radiology Reports Dg Chest 2 View  Result Date: 04/12/2018 CLINICAL DATA:  Cough.  Nausea, vomiting, and diarrhea. EXAM: CHEST - 2 VIEW COMPARISON:  04/09/2018 FINDINGS: Borderline enlargement of the cardiopericardial silhouette, without edema. The lungs appear otherwise clear. No blunting of the costophrenic angles. IMPRESSION: 1. Borderline enlargement of the cardiopericardial silhouette. Otherwise, no significant abnormalities are observed. Electronically Signed   By: Van Clines M.D.   On: 04/12/2018 20:17   Ct Chest Wo Contrast  Result Date: 04/02/2018 CLINICAL DATA:  72 year old female with recently diagnosed adenocarcinoma in the liver. Abdominal pain. Follow-up study. EXAM: CT CHEST WITHOUT CONTRAST TECHNIQUE: Multidetector CT imaging of the chest was performed following the standard protocol without IV contrast. COMPARISON:  CT the chest, abdomen and pelvis 08/07/2016. FINDINGS:  Cardiovascular: Heart size is normal. There is no significant pericardial fluid, thickening or pericardial  calcification. There is aortic atherosclerosis, as well as atherosclerosis of the great vessels of the mediastinum and the coronary arteries, including calcified atherosclerotic plaque in the left main and left anterior descending coronary arteries. Mediastinum/Nodes: No pathologically enlarged mediastinal or hilar lymph nodes. Please note that accurate exclusion of hilar adenopathy is limited on noncontrast CT scans. Esophagus is unremarkable in appearance. No axillary lymphadenopathy. Lungs/Pleura: Right-sided Bochdalek hernia containing only retroperitoneal fat. Diffuse bronchial wall thickening with moderate centrilobular and mild paraseptal emphysema. No acute consolidative airspace disease. No pleural effusions. 4 mm subpleural nodule in the periphery of the right lower lobe (axial image 106 of series 8). No other larger more suspicious appearing pulmonary nodules or masses are noted. Upper Abdomen: 2 large nonobstructive calculi in the upper pole collecting system of left kidney measuring up to 7 mm. Aortic atherosclerosis. Numerous colonic diverticulae are noted in the region of the splenic flexure. Musculoskeletal: There are no aggressive appearing lytic or blastic lesions noted in the visualized portions of the skeleton. IMPRESSION: 1. 4 mm subpleural nodule in the periphery of the right lower lobe. This is nonspecific, but strongly favored to represent a benign subpleural lymph node. Attention on follow-up studies is recommended to exclude the possibility of metastatic disease. 2. Diffuse bronchial wall thickening with moderate centrilobular and mild paraseptal emphysema; imaging findings suggestive of underlying COPD. 3. Aortic atherosclerosis, in addition to left main and left anterior descending coronary artery disease. Assessment for potential risk factor modification, dietary therapy or pharmacologic therapy may be warranted, if clinically indicated. 4. Nonobstructive calculi in the upper pole  collecting system of left kidney measuring up to 7 mm. Aortic Atherosclerosis (ICD10-I70.0) and Emphysema (ICD10-J43.9). Electronically Signed   By: Vinnie Langton M.D.   On: 04/02/2018 11:05   Ct Abdomen Pelvis W Contrast  Result Date: 04/09/2018 CLINICAL DATA:  Abdominal pain and tenderness. Adenocarcinoma of the liver characterized on biopsy 02/26/2018. Concern for primary cholangiocarcinoma. EXAM: CT ABDOMEN AND PELVIS WITH CONTRAST TECHNIQUE: Multidetector CT imaging of the abdomen and pelvis was performed using the standard protocol following bolus administration of intravenous contrast. CONTRAST:  32mL ISOVUE-300 IOPAMIDOL (ISOVUE-300) INJECTION 61% While interpreting the scan, no IV contrast was identified within the organs or vascular structures. CT tech Barbaraann Barthel) was asked to evaluate the patient in the emergency department as there is high suspicion of extravasation at injection site. In the interval patient had extravasation of IV saline from the ER interventions. ER staff and MD were advised of concern for IV contrast extravasation and patient was monitored and evaluated per extravasation protocol. COMPARISON:  CT 03/04/2018 FINDINGS: Lower chest: Lung bases are clear. Hepatobiliary: Significant interval increase in size of the RIGHT hepatic lobe infiltrative mass which now extends into the LEFT hepatic lobe measuring 8.0 x 4.7 cm compared to 5.0 x 4.9 cm on 03/04/2018. Coarse calcification centrally and medially within the mass is unchanged. No IV contrast administered. Fullness in the porta hepatis likely represents adenopathy. Difficult to define on noncontrast exam. Pancreas: Pancreas is normal. No ductal dilatation. No pancreatic inflammation. Spleen: Normal spleen Adrenals/urinary tract: Adrenal glands and kidneys are normal. Nonobstructing calculi in the LEFT kidney the ureters and bladder normal. Stomach/Bowel: Stomach, small-bowel appendix and cecum normal. Multiple diverticula of the  descending colon. Pericolonic inflammation along the ascending colon is slightly decreased. Extensive diverticulosis through the descending colon additionally. Rectum normal. Vascular/Lymphatic: Abdominal aorta is normal caliber with  atherosclerotic calcification. There is no retroperitoneal or periportal lymphadenopathy. No pelvic lymphadenopathy. Reproductive: Uterus and ovaries normal. Other: No peritoneal metastasis Musculoskeletal: No aggressive osseous lesion IMPRESSION: 1. Significant interval increase in the central hepatic mass (biopsy-proven adenocarcinoma) in short 1 month interval with mass approximately doubling in size. 2. Fullness in the porta hepatis likely represents adenopathy. Difficult to define without sufficient IV contrast exam. 3. Some apparent improvement in inflammation the ascending colon. 4. Extensive diverticulosis atherosclerotic disease. Electronically Signed   By: Suzy Bouchard M.D.   On: 04/09/2018 09:17   Dg Chest Port 1 View  Result Date: 04/09/2018 CLINICAL DATA:  Shortness of breath.  Renal failure. EXAM: PORTABLE CHEST 1 VIEW COMPARISON:  April 01, 2018 chest CT and chest radiograph June 26, 2017 FINDINGS: There is mild interstitial thickening. No edema or consolidation. Heart is borderline enlarged with pulmonary vascularity normal. No adenopathy. No evident bone lesions. IMPRESSION: Borderline cardiac enlargement. Interstitial mildly prominent, likely reflecting chronic inflammatory type change. No frank edema or consolidation evident. Electronically Signed   By: Mary Chapman III M.D.   On: 04/09/2018 07:11   US Abdomen Limited Ruq  Result Date: 04/12/2018 CLINICAL DATA:  Initial evaluation for cholangiocarcinoma. EXAM: ULTRASOUND ABDOMEN LIMITED RIGHT UPPER QUADRANT COMPARISON:  Prior CT from 04/09/2018. FINDINGS: Gallbladder: Not visualized. Common bile duct: Diameter: 4 mm Liver: Liver demonstrates a heterogeneous echotexture. Multiple heterogeneous  irregular hypoechoic masses present within the central aspect of the liver, consistent with biopsy proven adenocarcinoma. These measure 3.3 x 2.7 x 3.0 cm, 2.4 x 1.6 x 1.6 cm, and 4.0 x 3.6 x 2.4 cm. Although not well delineated by ultrasound, these are suspected to be contiguous with 1 another. Portal vein is patent on color Doppler imaging with normal direction of blood flow towards the liver. Possible trace free fluid within the adjacent right upper quadrant. Increased echogenicity within the visualized right kidney, suggesting medical renal disease. IMPRESSION: 1. Multifocal intrahepatic mass, consistent with biopsy proven adenocarcinoma. This is suspected be little interval changed relative to recent CT from 3 days prior, better evaluated on previous exam. 2. Main portal vein remains patent with no portal venous invasion by sonography. 3. No biliary dilatation. Electronically Signed   By: Jeannine Boga M.D.   On: 04/12/2018 17:48      Phillips Climes M.D on 04/13/2018 at 12:16 PM  Between 7am to 7pm - Pager - [phone]  After 7pm go to [email or web address] - password [facility]  [facility] -  Office  [phone]

## 2018-04-13 NOTE — Progress Notes (Signed)
 [facility] Progress Note   Subjective: Feels better today. Still tired. No new complaints. HD today on holiday schedule.   Objective Vitals:   04/12/18 1801 04/12/18 2131 04/13/18 0434 04/13/18 0952  BP: 133/80 122/79 117/72 122/75  Pulse: 84 90 93 91  Resp: 18 18 18 18   Temp: 98.3 F (36.8 C) 98.5 F (36.9 C) 98.6 F (37 C) 98.1 F (36.7 C)  TempSrc: Oral Oral Oral Oral  SpO2: 99% 97% 100% 96%  Weight:  65.4 kg    Height:       Physical Exam General: Chronically ill appearing female in NAD Heart: M6,N8 2/6 systolic M Lungs: CTAB slightly decreased in bases.  Abdomen: Liver palpable 3-4 cm below ribs, tender to palpation. No ascites. Active BS. Extremities: No LE edema Dialysis Access: L AVF +T/B   Additional Objective Labs: Basic Metabolic Panel: Recent Labs  Lab 04/11/18 0612 04/12/18 0508 04/13/18 0641  NA 136 137 137  K 3.5 3.3* 3.5  CL 101 102 102  CO2 26 26 26   GLUCOSE 101* 84 70  BUN 35* 21 34*  CREATININE 6.32* 4.53* 6.18*  CALCIUM 7.2* 7.3* 7.9*  PHOS 3.9  --   --    Liver Function Tests: Recent Labs  Lab 04/09/18 0704 04/11/18 0612 04/12/18 0508 04/13/18 0641  AST 48*  --  31 26  ALT 20  --  17 17  ALKPHOS 147*  --  103 123  BILITOT 0.5  --  0.6 0.6  PROT 7.2  --  5.6* 5.8*  ALBUMIN 2.3* 1.6* 1.5* 1.7*   No results for input(s): LIPASE, AMYLASE in the last 168 hours. CBC: Recent Labs  Lab 04/09/18 0704 04/10/18 0608 04/10/18 0954  04/11/18 0612 04/12/18 0508 04/13/18 0641  WBC 11.9* 13.8* 13.7*  --  13.4* 25.3* 20.8*  NEUTROABS 11.2*  --   --   --   --   --   --   HGB 8.0* 6.0* 6.4*   < > 7.6* 8.5* 8.6*  HCT 26.5* 19.6* 20.7*   < > 24.7* 26.7* 27.8*  MCV 98.9 95.1 95.0  --  96.1 92.7 93.9  PLT 446* 293 305  --  306 290 362   < > = values in this interval not displayed.   Blood Culture    Component Value Date/Time   SDES  04/09/2018 0713    BLOOD RIGHT ARM Performed at [facility], [address] [place], [place] [address]    [facility]  04/09/2018 [id number]    BOTTLES DRAWN AEROBIC AND ANAEROBIC Blood Culture adequate volume Performed at [facility], [address] [place], [place] [address]    CULT (A) 04/09/2018 3832    BACTEROIDES FRAGILIS BETA LACTAMASE POSITIVE Performed at [facility], [address], [place], [place] [address]    REPTSTATUS 04/13/2018 FINAL 04/09/2018 0713    Cardiac Enzymes: No results for input(s): CKTOTAL, CKMB, CKMBINDEX, TROPONINI in the last 168 hours. CBG: No results for input(s): GLUCAP in the last 168 hours. Iron Studies:  Recent Labs    04/10/18 1142  IRON 15*  TIBC 132*  FERRITIN 2,373*   @lablastinr3 @ Studies/Results: Dg Chest 2 View  Result Date: 04/12/2018 CLINICAL DATA:  Cough.  Nausea, vomiting, and diarrhea. EXAM: CHEST - 2 VIEW COMPARISON:  04/09/2018 FINDINGS: Borderline enlargement of the cardiopericardial silhouette, without edema. The lungs appear otherwise clear. No blunting of the costophrenic angles. IMPRESSION: 1. Borderline enlargement of  the cardiopericardial silhouette. Otherwise, no significant abnormalities are observed. Electronically Signed   By: Van Clines M.D.   On: 04/12/2018 20:17   US Abdomen Limited Ruq  Result Date: 04/12/2018 CLINICAL DATA:  Initial evaluation for cholangiocarcinoma. EXAM: ULTRASOUND ABDOMEN LIMITED RIGHT UPPER QUADRANT COMPARISON:  Prior CT from 04/09/2018. FINDINGS: Gallbladder: Not visualized. Common bile duct: Diameter: 4 mm Liver: Liver demonstrates a heterogeneous echotexture. Multiple heterogeneous irregular hypoechoic masses present within the central aspect of the liver, consistent with biopsy proven adenocarcinoma. These measure 3.3 x 2.7 x 3.0 cm, 2.4 x 1.6 x 1.6 cm, and 4.0 x 3.6 x 2.4 cm. Although not well delineated by ultrasound, these are suspected to be contiguous with 1 another. Portal vein is patent on color Doppler imaging with normal  direction of blood flow towards the liver. Possible trace free fluid within the adjacent right upper quadrant. Increased echogenicity within the visualized right kidney, suggesting medical renal disease. IMPRESSION: 1. Multifocal intrahepatic mass, consistent with biopsy proven adenocarcinoma. This is suspected be little interval changed relative to recent CT from 3 days prior, better evaluated on previous exam. 2. Main portal vein remains patent with no portal venous invasion by sonography. 3. No biliary dilatation. Electronically Signed   By: Jeannine Boga M.D.   On: 04/12/2018 17:48   Medications: . sodium chloride Stopped (04/09/18 1208)  . ampicillin-sulbactam (UNASYN) IV Stopped (04/12/18 2228)   . Chlorhexidine Gluconate Cloth  6 each Topical Q0600  . [START ON 04/14/2018] doxercalciferol  2 mcg Intravenous Q Wed-HD  . feeding supplement (NEPRO CARB STEADY)  237 mL Oral BID BM  . feeding supplement (PRO-STAT SUGAR FREE 64)  30 mL Oral BID  . folic acid  1 mg Oral Daily  . heparin injection (subcutaneous)  5,000 Units Subcutaneous Q8H  . ondansetron (ZOFRAN) IV  4 mg Intravenous Once  . pantoprazole  40 mg Oral BID  . senna-docusate  2 tablet Oral BID  . [START ON 04/15/2018] Vitamin D (Ergocalciferol)  50,000 Units Oral Weekly     HP MWF 4h 400/800  62kg 2K/2.5Ca  L AVF Hep 4000 units IV initial  bolus 2000 units IV mid run -Hectorol 2 mcg IV q week  -Mircera 14mcg IV q 2 weeks (last 11/13) -Auryixa 2 tabs qac  Assessment/Plan: 1. Sepsis-D/T GNR bacteremia bacteroides fragilis. Currently on Meropenem. WBC 20.8 Viral panel negative. Repeat BC ordered 04/13/18. Per primary  2. ESRD - MWF via AVF.HD today on holiday schedule.K+ 3.5 use 4K bath.Hold heparin on HD.  3. Hypertension/volume - HD 11/24 Was supposed to run even per note but net UF 1081. Ran 3 hours. HR in 140s-probably D/T chills/fever. BP improved today but still on low side.  Believe that she is dry. Run  even today-may require NS.  4. Anemia CKD/ABLA/Hx of GI bleed. - HGB 8.6 after 2 units PRBCs. S/P colonoscopy 03/05/18 showing severe diverticulosis throughout the colon, single ulcer cecum/  biopsied.Tsat 11% Ferritin 2373. Give Aranesp 150 mcg IV with HD today.  5. Metabolic bone disease - Ca 7.3 C Ca 9.2  Phos 3.9. Cont VDRA/Binder. 6. Nutrition.Ablumin 1.6. Low albumin 2/2 liver disease. Add prot supp 7. Adenocarcinoma of liver - new dx 02/2018. Abd CT showing sig increase in size of liver mass in 1 mo interval. Followed by Dr. Oretha Milch H.  NP-C 04/13/2018, 9:56 AM  [employer] [phone]

## 2018-04-13 NOTE — Evaluation (Signed)
Physical Therapy Evaluation Patient Details Name: Mary Chapman MRN: [id number] DOB: [birth year] Today's Date: 04/13/2018   History of Present Illness  Pt is a 72 y.o. female admitted for sepsis after having rigors and fever during HD. US abdomen abnormal for intrahepatic mass and consistent with biopsy revealing adenocarcinoma. PMH includes ESRD, HTN, diverticulitis, anemia, cancer and tobacco use.   Clinical Impression  Pt admitted with above diagnosis. Pt presents with fatigue and generalized muscle weakness limiting her ability to transfer and amb safely and independently. Pt amb min guard 300 ft with RW at <1.3 ft/sec, HR up to 102 bpm and SpO2 91% on RA during session. Pt educated on importance of mobility to prevent deconditioning and gain strength for safe and independent return home. Pt will benefit from skilled PT to increase her independence and safety with mobility to allow discharge to home with [facility] PT.       Follow Up Recommendations Home health PT    Equipment Recommendations  Rolling walker with 5" wheels    Recommendations for Other Services       Precautions / Restrictions Precautions Precautions: Fall Restrictions Weight Bearing Restrictions: No      Mobility  Bed Mobility Overal bed mobility: Needs Assistance Bed Mobility: Supine to Sit     Supine to sit: HOB elevated;Supervision     General bed mobility comments: Pt limited by fatigue, can maneuver sheets on her own  Transfers Overall transfer level: Needs assistance Equipment used: Rolling walker (2 wheeled) Transfers: Sit to/from Stand Sit to Stand: Min guard         General transfer comment: RW new to patient, followed multistep commands to push from bed to stand instead of pulling RW, used UE support to bring trunk upright  Ambulation/Gait Ambulation/Gait assistance: Min guard Gait Distance (Feet): 300 Feet Assistive device: Rolling walker (2 wheeled) Gait Pattern/deviations: Decreased  stride length;Step-through pattern;Trunk flexed Gait velocity: Speed decreased throughout session Gait velocity interpretation: <1.31 ft/sec, indicative of household ambulator General Gait Details: HR 103 SpO2 91 on RA midway through amb, 102bpm, 92% s/p session. Patient stated she was only limited by fatigue. Patient had difficulty maneuvering RW for 180 degree turns  Stairs            Wheelchair Mobility    Modified Rankin (Stroke Patients Only)       Balance Overall balance assessment: Needs assistance Sitting-balance support: Feet supported;No upper extremity supported Sitting balance-Leahy Scale: Good     Standing balance support: Bilateral upper extremity supported;No upper extremity supported Standing balance-Leahy Scale: Good Standing balance comment: Pt able to reach outside of BOS to get tissues, throw tissues away without UE support, otherwise relied on B UE support with amb                             Pertinent Vitals/Pain Pain Assessment: No/denies pain    Home Living Family/patient expects to be discharged to:: Private residence Living Arrangements: Alone Available Help at Discharge: Family Type of Home: House Home Access: Stairs to enter   Technical brewer of Steps: 3 Home Layout: One level Home Equipment: None      Prior Function Level of Independence: Independent         Comments: Does not use an AD, does own shopping,      Hand Dominance        Extremity/Trunk Assessment   Upper Extremity Assessment Upper Extremity Assessment: Overall WFL for tasks assessed  Lower Extremity Assessment Lower Extremity Assessment: Generalized weakness       Communication   Communication: No difficulties  Cognition Arousal/Alertness: Awake/alert Behavior During Therapy: WFL for tasks assessed/performed Overall Cognitive Status: Within Functional Limits for tasks assessed                                         General Comments      Exercises     Assessment/Plan    PT Assessment Patient needs continued PT services  PT Problem List Decreased strength;Decreased mobility;Decreased activity tolerance;Cardiopulmonary status limiting activity;Decreased knowledge of use of DME       PT Treatment Interventions Therapeutic activities;Gait training;Therapeutic exercise;Patient/family education;Stair training;Balance training;Functional mobility training;Neuromuscular re-education    PT Goals (Current goals can be found in the Care Plan section)  Acute Rehab PT Goals Patient Stated Goal: walk further PT Goal Formulation: With patient Time For Goal Achievement: 04/27/18 Potential to Achieve Goals: Good    Frequency Min 3X/week   Barriers to discharge        Co-evaluation               AM-PAC PT "6 Clicks" Mobility  Outcome Measure Help needed turning from your back to your side while in a flat bed without using bedrails?: None Help needed moving from lying on your back to sitting on the side of a flat bed without using bedrails?: None Help needed moving to and from a bed to a chair (including a wheelchair)?: A Little Help needed standing up from a chair using your arms (e.g., wheelchair or bedside chair)?: A Little Help needed to walk in hospital room?: A Little Help needed climbing 3-5 steps with a railing? : A Lot 6 Click Score: 19    End of Session Equipment Utilized During Treatment: Gait belt Activity Tolerance: Patient tolerated treatment well Patient left: with call bell/phone within reach;in chair Nurse Communication: Mobility status(posey box not present in room) PT Visit Diagnosis: Muscle weakness (generalized) (M62.81);Difficulty in walking, not elsewhere classified (R26.2);Unsteadiness on feet (R26.81)    Time: 4854-6270 PT Time Calculation (min) (ACUTE ONLY): 26 min   Charges:   PT Evaluation $PT Eval Moderate Complexity: 1 Mod PT Treatments $Gait  Training: 8-22 mins        Gilda Crease, SPT Acute Rehab Services [place] 04/13/2018, 1:18 PM

## 2018-04-13 NOTE — Consult Note (Addendum)
Cardiology Consult    Mary Chapman ID: Mary Chapman MRN: [id number], DOB/AGE: 72-Sep-1947   Admit date: 04/09/2018 Date of Consult: 04/13/2018  Primary Physician: Glendon Axe, MD Primary Cardiologist: New Consult (Dr. Marlou Porch)  Mary Chapman Profile    Mary Chapman is a 72 y.o. female with a history of ESRD secondary to focal segmental glomerulosclerosis on hemodialysis M/W/F, hypertension, anemia of chronic disease, diverticulitis, previous tobacco use (quit in 07/2016), and recent diagnosis of cholangiocarcinoma, who is being seen today for a preoperative evaluation at the request of Dr. Burr Medico.  History of Present Illness   Mary Chapman is a 72 year old female a history of ESRD secondary to focal segmental glomerulosclerosis on hemodialysis M/W/F, hypertension, anemia of chronic disease, diverticulitis, tobacco use, and recent diagnosis of cholangiocarcinoma. Mary Chapman has no previous cardiac history and does not see a Cardiologist. Mary Chapman admitted from 08/07/2016 to 08/20/2016 for shortness of breath and lower extremity edema. Echo at that time showed LVEF of 65-70% with no wall motional abnormalities and grade 1 diastolic dysfunction. Symptoms were felt to be non-cardiac in nature. Mary Chapman was noted to have nephrotic range proteinuria and a percutaneous kidney biopsy showed collapsing FSGS.   Mary Chapman presented to the [facility] ED on 04/09/2018 for evaluation of body chill and nausea/vomiting for the prior 5 days. Upon arrival to the ED, EKG showed sinus tachycardia, rate 142, with prolonged QTc but no acute ischemic changes compared to prior tracings. WBC 11.9, Hgb 8.0, Plts 446. Na 137, K 4.2, Glucose 135, SCr 11.75. Mary Chapman admitted for sepsis and started on IV antibiotics. Sepsis felt to be secondary to worsening liver mass. Abdominal CT showed central hepatic mass has about doubled in size in the past month. Blood cultures positive for gram-negative rods showing Bacteroides fragilis. Hemoglobin dropped to  6.0 on 04/10/2018 and Mary Chapman required transfusion of 2 units of PRBCs with good response. Oncology has been consulted. Cardiology was consulted today for a pre-operative evaluation. Per chart review, Mary Chapman was recently referred to [facility] for pre-operative evaluation for partial hepatectomy and Mary Chapman was scheduled for an appointment on 04/22/2018.  Mary Chapman denise any cardiac symptoms including chest pain, shortness of breath, palpitations, orthopnea, or significant lower extremity edema since starting hemodialysis. Mary Chapman notes some lightheadedness/ dizziness if Mary Chapman stands up quickly. Mary Chapman has had an 20-25 unintentional weight loss in the past year and has noticed recent diaphoresis at night likely related to Mary Chapman cholangiocarcinoma.  Mary Chapman has a 20 year smoking history but states Mary Chapman quit last year when Mary Chapman was diagnosed with focal segmental glomerulosclerosis. Mary Chapman has no known family history of heart disease.   Past Medical History   Past Medical History:  Diagnosis Date  . Diverticulitis   . Focal segmental glomerulosclerosis    ESRD on MWF HD  . Hypertension   . Tobacco abuse     Past Surgical History:  Procedure Laterality Date  . AV FISTULA PLACEMENT Left 03/18/2017   Procedure: Brachiocephalic ARTERIOVENOUS (AV) FISTULA CREATION left arm;  Surgeon: Elam Dutch, MD;  Location: [place];  Service: Vascular;  Laterality: Left;  . BIOPSY  03/05/2018   Procedure: BIOPSY;  Surgeon: Rush Landmark Telford Nab., MD;  Location: [place];  Service: Gastroenterology;;  enteroscopy bx /cytology brushing Greig Castilla bx  . COLONOSCOPY WITH PROPOFOL N/A 03/05/2018   Procedure: COLONOSCOPY WITH PROPOFOL;  Surgeon: Rush Landmark Telford Nab., MD;  Location: [place];  Service: Gastroenterology;  Laterality: N/A;  Bx, Spot  . ENTEROSCOPY N/A 03/05/2018   Procedure: ENTEROSCOPY;  Surgeon: Mansouraty,  Telford Nab., MD;  Location: [facility];  Service: Gastroenterology;  Laterality:  N/A;  BX, Brushing, & Spot  . INSERTION OF DIALYSIS CATHETER N/A 03/18/2017   Procedure: INSERTION OF DIALYSIS CATHETER;  Surgeon: Elam Dutch, MD;  Location: [place];  Service: Vascular;  Laterality: N/A;  . IR GENERIC HISTORICAL  08/15/2016   IR US GUIDE VASC ACCESS RIGHT 08/15/2016 Arne Cleveland, MD [facility]INTER[facility]  . IR GENERIC HISTORICAL  08/15/2016   IR FLUORO GUIDE CV LINE RIGHT 08/15/2016 Arne Cleveland, MD [facility]  . SUBMUCOSAL INJECTION  03/05/2018   Procedure: SUBMUCOSAL INJECTION;  Surgeon: Rush Landmark Telford Nab., MD;  Location: [place];  Service: Gastroenterology;;  spot tatoo  . TUBAL LIGATION       Allergies  No Known Allergies  Inpatient Medications    . Chlorhexidine Gluconate Cloth  6 each Topical Q0600  . darbepoetin (ARANESP) injection - DIALYSIS  150 mcg Intravenous Once  . [START ON 04/14/2018] doxercalciferol  2 mcg Intravenous Q Wed-HD  . feeding supplement (NEPRO CARB STEADY)  237 mL Oral BID BM  . feeding supplement (PRO-STAT SUGAR FREE 64)  30 mL Oral BID  . folic acid  1 mg Oral Daily  . heparin injection (subcutaneous)  5,000 Units Subcutaneous Q8H  . ondansetron (ZOFRAN) IV  4 mg Intravenous Once  . pantoprazole  40 mg Oral BID  . senna-docusate  2 tablet Oral BID  . [START ON 04/15/2018] Vitamin D (Ergocalciferol)  50,000 Units Oral Weekly    Family History    Family History  Problem Relation Age of Onset  . Hypertension Mother   . Diabetes Mother   . Hypertension Father   . Diabetes Father   . Hypertension Sister   . Hypertension Brother    Mary Chapman indicated that the status of Mary Chapman mother is unknown. Mary Chapman indicated that the status of Mary Chapman father is unknown. Mary Chapman indicated that the status of Mary Chapman sister is unknown. Mary Chapman indicated that the status of Mary Chapman brother is unknown.   Social History    Social History   Socioeconomic History  . Marital status: Widowed    Spouse name: Not on file  . Number of children: Not on file  . Years of  education: Not on file  . Highest education level: Not on file  Occupational History  . Occupation: retired  Scientific laboratory technician  . Financial resource strain: Not on file  . Food insecurity:    Worry: Not on file    Inability: Not on file  . Transportation needs:    Medical: Not on file    Non-medical: Not on file  Tobacco Use  . Smoking status: Current Some Day Smoker    Packs/day: 0.25    Years: 20.00    Pack years: 5.00    Types: Cigarettes  . Smokeless tobacco: Never Used  . Tobacco comment: 3 cigs/day  Substance and Sexual Activity  . Alcohol use: No    Comment: moderate drinker for 10 years, quit in 30 years  . Drug use: No  . Sexual activity: Not Currently    Birth control/protection: Post-menopausal  Lifestyle  . Physical activity:    Days per week: Not on file    Minutes per session: Not on file  . Stress: Not on file  Relationships  . Social connections:    Talks on phone: Not on file    Gets together: Not on file    Attends religious service: Not on file    Active  member of club or organization: Not on file    Attends meetings of clubs or organizations: Not on file    Relationship status: Not on file  . Intimate partner violence:    Fear of current or ex partner: Not on file    Emotionally abused: Not on file    Physically abused: Not on file    Forced sexual activity: Not on file  Other Topics Concern  . Not on file  Social History Narrative  . Not on file     Review of Systems    Review of Systems  Constitutional: Positive for chills, diaphoresis (at night) and weight loss.  HENT: Positive for congestion.   Eyes: Negative for blurred vision and double vision.  Respiratory: Positive for cough. Negative for sputum production and shortness of breath.   Cardiovascular: Negative for chest pain, palpitations, orthopnea, leg swelling and PND.  Gastrointestinal: Negative for blood in stool and nausea.  Genitourinary: Negative for hematuria.  Musculoskeletal:  Negative for myalgias.  Neurological: Positive for dizziness (with standing quickly). Negative for loss of consciousness.  Endo/Heme/Allergies: Does not bruise/bleed easily.  Psychiatric/Behavioral: Negative for substance abuse (previous tobacco use).  All other systems reviewed and are negative.   Physical Exam    Blood pressure 122/75, pulse 91, temperature 98.1 F (36.7 C), temperature source Oral, resp. rate 18, height 5' 6.5" (1.689 m), weight 65.4 kg, SpO2 96 %.  General: 72 y.o. African-American female resting comfortably in no acute distress. Pleasant and cooperative. HEENT: Normal  Neck: Supple. No carotid bruits or JVD appreciated. Lungs: No increased work of breathing. Regular respiratory rate. Lungs relatively clear to ausculation with possible mild crackles noted in bilateral bases. Heart: RRR. Distinct S1 and S2. Soft II/VI systolic murmur noted. No gallops or rubs.  Abdomen: Soft and non-distended with tenderness to palpation of right upper quadrant. Bowel sounds present. Extremities: No cyanosis or edema. Distal pedal and radial pulses 2+ and equal bilaterally. Neuro: Alert and oriented x3. No focal deficits. Moves all extremities spontaneously. Psych: Normal affect.  Labs    Troponin (Point of Care Test) No results for input(s): TROPIPOC in the last 72 hours. No results for input(s): CKTOTAL, CKMB, TROPONINI in the last 72 hours. Lab Results  Component Value Date   WBC 20.8 (H) 04/13/2018   HGB 8.6 (L) 04/13/2018   HCT 27.8 (L) 04/13/2018   MCV 93.9 04/13/2018   PLT 362 04/13/2018    Recent Labs  Lab 04/13/18 0641  NA 137  K 3.5  CL 102  CO2 26  BUN 34*  CREATININE 6.18*  CALCIUM 7.9*  PROT 5.8*  BILITOT 0.6  ALKPHOS 123  ALT 17  AST 26  GLUCOSE 70   Lab Results  Component Value Date   CHOL 219 (H) 03/18/2017   HDL 63 03/18/2017   LDLCALC 110 (H) 03/18/2017   TRIG 229 (H) 03/18/2017   No results found for: [facility]   Radiology Studies    Dg  Chest 2 View  Result Date: 04/12/2018 CLINICAL DATA:  Cough.  Nausea, vomiting, and diarrhea. EXAM: CHEST - 2 VIEW COMPARISON:  04/09/2018 FINDINGS: Borderline enlargement of the cardiopericardial silhouette, without edema. The lungs appear otherwise clear. No blunting of the costophrenic angles. IMPRESSION: 1. Borderline enlargement of the cardiopericardial silhouette. Otherwise, no significant abnormalities are observed. Electronically Signed   By: Van Clines M.D.   On: 04/12/2018 20:17   Ct Chest Wo Contrast  Result Date: 04/02/2018 CLINICAL DATA:  72 year old female with  recently diagnosed adenocarcinoma in the liver. Abdominal pain. Follow-up study. EXAM: CT CHEST WITHOUT CONTRAST TECHNIQUE: Multidetector CT imaging of the chest was performed following the standard protocol without IV contrast. COMPARISON:  CT the chest, abdomen and pelvis 08/07/2016. FINDINGS: Cardiovascular: Heart size is normal. There is no significant pericardial fluid, thickening or pericardial calcification. There is aortic atherosclerosis, as well as atherosclerosis of the great vessels of the mediastinum and the coronary arteries, including calcified atherosclerotic plaque in the left main and left anterior descending coronary arteries. Mediastinum/Nodes: No pathologically enlarged mediastinal or hilar lymph nodes. Please note that accurate exclusion of hilar adenopathy is limited on noncontrast CT scans. Esophagus is unremarkable in appearance. No axillary lymphadenopathy. Lungs/Pleura: Right-sided Bochdalek hernia containing only retroperitoneal fat. Diffuse bronchial wall thickening with moderate centrilobular and mild paraseptal emphysema. No acute consolidative airspace disease. No pleural effusions. 4 mm subpleural nodule in the periphery of the right lower lobe (axial image 106 of series 8). No other larger more suspicious appearing pulmonary nodules or masses are noted. Upper Abdomen: 2 large nonobstructive  calculi in the upper pole collecting system of left kidney measuring up to 7 mm. Aortic atherosclerosis. Numerous colonic diverticulae are noted in the region of the splenic flexure. Musculoskeletal: There are no aggressive appearing lytic or blastic lesions noted in the visualized portions of the skeleton. IMPRESSION: 1. 4 mm subpleural nodule in the periphery of the right lower lobe. This is nonspecific, but strongly favored to represent a benign subpleural lymph node. Attention on follow-up studies is recommended to exclude the possibility of metastatic disease. 2. Diffuse bronchial wall thickening with moderate centrilobular and mild paraseptal emphysema; imaging findings suggestive of underlying COPD. 3. Aortic atherosclerosis, in addition to left main and left anterior descending coronary artery disease. Assessment for potential risk factor modification, dietary therapy or pharmacologic therapy may be warranted, if clinically indicated. 4. Nonobstructive calculi in the upper pole collecting system of left kidney measuring up to 7 mm. Aortic Atherosclerosis (ICD10-I70.0) and Emphysema (ICD10-J43.9). Electronically Signed   By: Vinnie Langton M.D.   On: 04/02/2018 11:05   Ct Abdomen Pelvis W Contrast  Result Date: 04/09/2018 CLINICAL DATA:  Abdominal pain and tenderness. Adenocarcinoma of the liver characterized on biopsy 02/26/2018. Concern for primary cholangiocarcinoma. EXAM: CT ABDOMEN AND PELVIS WITH CONTRAST TECHNIQUE: Multidetector CT imaging of the abdomen and pelvis was performed using the standard protocol following bolus administration of intravenous contrast. CONTRAST:  57mL ISOVUE-300 IOPAMIDOL (ISOVUE-300) INJECTION 61% While interpreting the scan, no IV contrast was identified within the organs or vascular structures. CT tech Barbaraann Barthel) was asked to evaluate the Mary Chapman in the emergency department as there is high suspicion of extravasation at injection site. In the interval Mary Chapman had  extravasation of IV saline from the ER interventions. ER staff and MD were advised of concern for IV contrast extravasation and Mary Chapman was monitored and evaluated per extravasation protocol. COMPARISON:  CT 03/04/2018 FINDINGS: Lower chest: Lung bases are clear. Hepatobiliary: Significant interval increase in size of the RIGHT hepatic lobe infiltrative mass which now extends into the LEFT hepatic lobe measuring 8.0 x 4.7 cm compared to 5.0 x 4.9 cm on 03/04/2018. Coarse calcification centrally and medially within the mass is unchanged. No IV contrast administered. Fullness in the porta hepatis likely represents adenopathy. Difficult to define on noncontrast exam. Pancreas: Pancreas is normal. No ductal dilatation. No pancreatic inflammation. Spleen: Normal spleen Adrenals/urinary tract: Adrenal glands and kidneys are normal. Nonobstructing calculi in the LEFT kidney the ureters and  bladder normal. Stomach/Bowel: Stomach, small-bowel appendix and cecum normal. Multiple diverticula of the descending colon. Pericolonic inflammation along the ascending colon is slightly decreased. Extensive diverticulosis through the descending colon additionally. Rectum normal. Vascular/Lymphatic: Abdominal aorta is normal caliber with atherosclerotic calcification. There is no retroperitoneal or periportal lymphadenopathy. No pelvic lymphadenopathy. Reproductive: Uterus and ovaries normal. Other: No peritoneal metastasis Musculoskeletal: No aggressive osseous lesion IMPRESSION: 1. Significant interval increase in the central hepatic mass (biopsy-proven adenocarcinoma) in short 1 month interval with mass approximately doubling in size. 2. Fullness in the porta hepatis likely represents adenopathy. Difficult to define without sufficient IV contrast exam. 3. Some apparent improvement in inflammation the ascending colon. 4. Extensive diverticulosis atherosclerotic disease. Electronically Signed   By: Suzy Bouchard M.D.   On:  04/09/2018 09:17   Dg Chest Port 1 View  Result Date: 04/09/2018 CLINICAL DATA:  Shortness of breath.  Renal failure. EXAM: PORTABLE CHEST 1 VIEW COMPARISON:  April 01, 2018 chest CT and chest radiograph June 26, 2017 FINDINGS: There is mild interstitial thickening. No edema or consolidation. Heart is borderline enlarged with pulmonary vascularity normal. No adenopathy. No evident bone lesions. IMPRESSION: Borderline cardiac enlargement. Interstitial mildly prominent, likely reflecting chronic inflammatory type change. No frank edema or consolidation evident. Electronically Signed   By: [name] III M.D.   On: 04/09/2018 07:11   US Abdomen Limited Ruq  Result Date: 04/12/2018 CLINICAL DATA:  Initial evaluation for cholangiocarcinoma. EXAM: ULTRASOUND ABDOMEN LIMITED RIGHT UPPER QUADRANT COMPARISON:  Prior CT from 04/09/2018. FINDINGS: Gallbladder: Not visualized. Common bile duct: Diameter: 4 mm Liver: Liver demonstrates a heterogeneous echotexture. Multiple heterogeneous irregular hypoechoic masses present within the central aspect of the liver, consistent with biopsy proven adenocarcinoma. These measure 3.3 x 2.7 x 3.0 cm, 2.4 x 1.6 x 1.6 cm, and 4.0 x 3.6 x 2.4 cm. Although not well delineated by ultrasound, these are suspected to be contiguous with 1 another. Portal vein is patent on color Doppler imaging with normal direction of blood flow towards the liver. Possible trace free fluid within the adjacent right upper quadrant. Increased echogenicity within the visualized right kidney, suggesting medical renal disease. IMPRESSION: 1. Multifocal intrahepatic mass, consistent with biopsy proven adenocarcinoma. This is suspected be little interval changed relative to recent CT from 3 days prior, better evaluated on previous exam. 2. Main portal vein remains patent with no portal venous invasion by sonography. 3. No biliary dilatation. Electronically Signed   By: Jeannine Boga M.D.    On: 04/12/2018 17:48    EKG     EKG: EKGs below were personally reviewed: - EKG in ED on 04/09/2018 demonstrates: Sinus tachycardia, rate 142 bpm, with prolonged QTc of 511 but no acute ischemic changes compared to prior tracings. - EKG on 04/11/2018 demonstrates: Sinus tachycardia, rate 139 bpm, with no acute ischemic changes.  Telemetry: Telemetry was personally reviewed and demonstrates: Sinus rhythm.  Cardiac Imaging    Echocardiogram 08/08/2016: Study Conclusions: - Left ventricle: The cavity size was normal. Systolic function was   vigorous. The estimated ejection fraction was in the range of 65%   to 70%. Wall motion was normal; there were no regional wall   motion abnormalities. Doppler parameters are consistent with   abnormal left ventricular relaxation (grade 1 diastolic   dysfunction). There was no evidence of elevated ventricular   filling pressure by Doppler parameters. - Aortic valve: There was no regurgitation. - Aortic root: The aortic root was normal in size. - Mitral valve: There was  mild regurgitation. - Right ventricle: Systolic function was normal. - Right atrium: The atrium was normal in size. - Tricuspid valve: There was mild regurgitation. - Pulmonic valve: There was no regurgitation. - Pulmonary arteries: Systolic pressure was within the normal   range. - Inferior vena cava: The vessel was dilated. The respirophasic   diameter changes were blunted (< 50%), consistent with elevated   central venous pressure. - Pericardium, extracardiac: There was no pericardial effusion.  Impressions: - Consider non-cardiac causes for anasarca and dyspnea.  Assessment & Plan    1. Pre-Operative Evaluation - Mary Chapman admitted on 04/09/2018 for sepsis felt to be secondary to worsening cholangiocarcinoma. Cardiology was consulted for pre-operative evaluation. Mary Chapman was recently referred to [facility] for pre-operative evaluation for partial hepatectomy and Mary Chapman  was scheduled for an appointment on 04/22/2018. - Mary Chapman has no previous cardiac history. - EKG showed sinus tachycardia with no acute ischemic changes.  - Echo from 07/2016 showed LVEF of 65-70% with no wall motional abnormalities and grade 1 diastolic dysfunction. - Per Revised Cardiac Risk Index [place] Criteria), Mary Chapman considered moderate risk. Discussed with Dr. Marlou Porch. Mary Chapman would likely benefit from stress test prior to any surgery once Mary Chapman is stable from a medical standpoint and Mary Chapman sepsis has cleared.  Otherwise, per Internal Medicine, Nephrology, and Oncology.  Signed, Darreld Mclean, PA-C 04/13/2018, 12:12 PM  For questions or updates, please contact   Please consult [email or web address] for contact info under Cardiology/STEMI.  Personally seen and examined. Agree with above.  Before proceeding with intra-abdominal surgery for worsening cholangiocarcinoma, Mary Chapman would recommend that Mary Chapman proceed with nuclear stress test, pharmacologic for further risk stratification.  Mary Chapman personally reviewed Mary Chapman CT scan of chest which reveals calcification of Mary Chapman proximal LAD.  Mary Chapman is also unable to complete greater than 4 METS of activity.  My concern is that Mary Chapman may have high risk ischemia.  On the other hand, Mary Chapman is not complaining of any anginal symptoms.  Currently Mary Chapman is comfortable laying flat in dialysis for Mary Chapman end-stage renal disease.  Before proceeding with stress test, Mary Chapman must first get over Mary Chapman sepsis/infection.  Perhaps on Friday we will be able to proceed with stress test here in the hospital.  Mary Chapman think would make sense for [place] to go ahead and complete this work-up prior to Mary Chapman leaving. Mary Chapman discussed with Mary Chapman care team.   Candee Furbish, MD

## 2018-04-14 LAB — CBC
HCT: 27.4 % — ABNORMAL LOW (ref 36.0–46.0)
Hemoglobin: 8.5 g/dL — ABNORMAL LOW (ref 12.0–15.0)
MCH: 29.5 pg (ref 26.0–34.0)
MCHC: 31 g/dL (ref 30.0–36.0)
MCV: 95.1 fL (ref 80.0–100.0)
PLATELETS: 378 10*3/uL (ref 150–400)
RBC: 2.88 MIL/uL — ABNORMAL LOW (ref 3.87–5.11)
RDW: 16 % — AB (ref 11.5–15.5)
WBC: 13.8 10*3/uL — ABNORMAL HIGH (ref 4.0–10.5)
nRBC: 0 % (ref 0.0–0.2)

## 2018-04-14 NOTE — Progress Notes (Addendum)
Progress Note  Patient Name: Mary Chapman Date of Encounter: 04/14/2018  Primary Cardiologist:  Candee Furbish, MD  Subjective   No chest pain or SOB overnight, ambulated in halls yesterday w/out sx.   Inpatient Medications    Scheduled Meds: . Chlorhexidine Gluconate Cloth  6 each Topical Q0600  . darbepoetin (ARANESP) injection - DIALYSIS  150 mcg Intravenous Once  . doxercalciferol  2 mcg Intravenous Q Wed-HD  . feeding supplement (NEPRO CARB STEADY)  237 mL Oral BID BM  . feeding supplement (PRO-STAT SUGAR FREE 64)  30 mL Oral BID  . folic acid  1 mg Oral Daily  . heparin injection (subcutaneous)  5,000 Units Subcutaneous Q8H  . ondansetron (ZOFRAN) IV  4 mg Intravenous Once  . pantoprazole  40 mg Oral BID  . senna-docusate  2 tablet Oral BID  . [START ON 04/15/2018] Vitamin D (Ergocalciferol)  50,000 Units Oral Weekly   Continuous Infusions: . sodium chloride Stopped (04/09/18 1208)  . ampicillin-sulbactam (UNASYN) IV 3 g (04/13/18 1831)   PRN Meds: sodium chloride, acetaminophen, oxyCODONE, polyethylene glycol   Vital Signs    Vitals:   04/13/18 1730 04/13/18 1742 04/13/18 2235 04/14/18 0551  BP: 126/75 133/74 119/82 111/69  Pulse: 89 88 (!) 108 86  Resp:  20  17  Temp:  98.7 F (37.1 C) (!) 101.1 F (38.4 C) 98.6 F (37 C)  TempSrc:  Oral Oral Oral  SpO2:  98% 100% 97%  Weight:  63.6 kg    Height:        Intake/Output Summary (Last 24 hours) at 04/14/2018 0732 Last data filed at 04/13/2018 1900 Gross per 24 hour  Intake 240 ml  Output 0 ml  Net 240 ml   Filed Weights   04/12/18 2131 04/13/18 1330 04/13/18 1742  Weight: 65.4 kg 63.5 kg 63.6 kg    Telemetry    SR, ST - Personally Reviewed  ECG    None today - Personally Reviewed  Physical Exam   General: Well developed, well nourished, female appearing in no acute distress. Head: Normocephalic, atraumatic.  Neck: Supple without bruits, JVD not elevated. Lungs:  Resp regular and  unlabored, CTA. Heart: RRR, S1, S2, no S3, S4, or murmur; no rub. Abdomen: Soft, tender R side, non-distended with bowel sounds. No hepatomegaly.  Extremities: No clubbing, cyanosis, no edema. Distal pedal pulses are 2+ bilaterally. Neuro: Alert and oriented X 3. Moves all extremities spontaneously. Psych: Normal affect.  Labs    Hematology Recent Labs  Lab 04/12/18 0508 04/13/18 0641 04/14/18 0525  WBC 25.3* 20.8* 13.8*  RBC 2.88* 2.96* 2.88*  HGB 8.5* 8.6* 8.5*  HCT 26.7* 27.8* 27.4*  MCV 92.7 93.9 95.1  MCH 29.5 29.1 29.5  MCHC 31.8 30.9 31.0  RDW 16.1* 16.3* 16.0*  PLT 290 362 378    Chemistry Recent Labs  Lab 04/09/18 0704  04/11/18 0612 04/12/18 0508 04/13/18 0641  NA 137   < > 136 137 137  K 4.2   < > 3.5 3.3* 3.5  CL 93*   < > 101 102 102  CO2 22   < > 26 26 26   GLUCOSE 135*   < > 101* 84 70  BUN 58*   < > 35* 21 34*  CREATININE 11.75*   < > 6.32* 4.53* 6.18*  CALCIUM 7.5*   < > 7.2* 7.3* 7.9*  PROT 7.2  --   --  5.6* 5.8*  ALBUMIN 2.3*  --  1.6* 1.5*  1.7*  AST 48*  --   --  31 26  ALT 20  --   --  17 17  ALKPHOS 147*  --   --  103 123  BILITOT 0.5  --   --  0.6 0.6  GFRNONAA 3*   < > 6* 9* 6*  GFRAA 3*   < > 7* 10* 7*  ANIONGAP >20*   < > 9 9 9    < > = values in this interval not displayed.     Radiology    Dg Chest 2 View  Result Date: 04/12/2018 CLINICAL DATA:  Cough.  Nausea, vomiting, and diarrhea. EXAM: CHEST - 2 VIEW COMPARISON:  04/09/2018 FINDINGS: Borderline enlargement of the cardiopericardial silhouette, without edema. The lungs appear otherwise clear. No blunting of the costophrenic angles. IMPRESSION: 1. Borderline enlargement of the cardiopericardial silhouette. Otherwise, no significant abnormalities are observed. Electronically Signed   By: Van Clines M.D.   On: 04/12/2018 20:17   US Abdomen Limited Ruq  Result Date: 04/12/2018 CLINICAL DATA:  Initial evaluation for cholangiocarcinoma. EXAM: ULTRASOUND ABDOMEN LIMITED  RIGHT UPPER QUADRANT COMPARISON:  Prior CT from 04/09/2018. FINDINGS: Gallbladder: Not visualized. Common bile duct: Diameter: 4 mm Liver: Liver demonstrates a heterogeneous echotexture. Multiple heterogeneous irregular hypoechoic masses present within the central aspect of the liver, consistent with biopsy proven adenocarcinoma. These measure 3.3 x 2.7 x 3.0 cm, 2.4 x 1.6 x 1.6 cm, and 4.0 x 3.6 x 2.4 cm. Although not well delineated by ultrasound, these are suspected to be contiguous with 1 another. Portal vein is patent on color Doppler imaging with normal direction of blood flow towards the liver. Possible trace free fluid within the adjacent right upper quadrant. Increased echogenicity within the visualized right kidney, suggesting medical renal disease. IMPRESSION: 1. Multifocal intrahepatic mass, consistent with biopsy proven adenocarcinoma. This is suspected be little interval changed relative to recent CT from 3 days prior, better evaluated on previous exam. 2. Main portal vein remains patent with no portal venous invasion by sonography. 3. No biliary dilatation. Electronically Signed   By: Jeannine Boga M.D.   On: 04/12/2018 17:48     Cardiac Studies   ECHO:  08/08/2016 - Left ventricle: The cavity size was normal. Systolic function was   vigorous. The estimated ejection fraction was in the range of 65%   to 70%. Wall motion was normal; there were no regional wall   motion abnormalities. Doppler parameters are consistent with   abnormal left ventricular relaxation (grade 1 diastolic   dysfunction). There was no evidence of elevated ventricular   filling pressure by Doppler parameters. - Aortic valve: There was no regurgitation. - Aortic root: The aortic root was normal in size. - Mitral valve: There was mild regurgitation. - Right ventricle: Systolic function was normal. - Right atrium: The atrium was normal in size. - Tricuspid valve: There was mild regurgitation. - Pulmonic  valve: There was no regurgitation. - Pulmonary arteries: Systolic pressure was within the normal   range. - Inferior vena cava: The vessel was dilated. The respirophasic   diameter changes were blunted (< 50%), consistent with elevated   central venous pressure. - Pericardium, extracardiac: There was no pericardial effusion.  Impressions:  - Consider non-cardiac causes for anasarca and dyspnea.  Patient Profile     72 y.o. female w/ hx  ESRD secondary to focal segmental glomerulosclerosis on hemodialysis M/W/F, hypertension, anemia of chronic disease, diverticulitis, previous tobacco use (quit in 07/2016), LAD & [place] atherosclerosis  on chest CT, and recent diagnosis of cholangiocarcinoma, who was admitted 11/22 N&V, fever and chills. S/p 2 U PRBCs for anemia. Sepsis felt 2nd worsening liver mass. Cards saw 11/26 for preop eval, partial hepatectomy. MV planned 11/29.   Assessment & Plan    1. Preop eval - MV planned 11/29, will order - she has had a small amount of breakfast today - no ischemic sx  2. Sepsis - mgt and ABX per IM - temp spike 101.1 last pm  Otherwise, per IM Principal Problem:   Sepsis ([place]) Active Problems:   Anasarca   Tobacco abuse   Essential hypertension   ESRD on dialysis [facility])   Cholangiocarcinoma ([place])    Signed, Rosaria Ferries , PA-C 7:32 AM 04/14/2018 Pager: [phone]  Personally seen and examined. Agree with above.  Waiting for her fever to resolve, antibiotics in place prior to nuclear stress test for further risk stratification prior to surgery.  She is alert.  Regular rate and rhythm.  Lab work reviewed.  Not having any chest discomfort shortness of breath.  Temperature last night 101.1.  Nuclear stress test will be ordered for Friday. She is planning to undergo partial hepatectomy for cholangiocarcinoma.  Candee Furbish, MD

## 2018-04-14 NOTE — Progress Notes (Addendum)
[facility] Progress Note   Dialysis Orders: MWF 4h400/80062kg 2K/2.5Ca  L AVF Hep 4000units IV initialbolus 2000 units IVmid run -Hectorol 2 mcg IV q week  -Mircera 153mcg IV q 2 weeks (last 11/13) -Auryixa 2 tabs qac  Assessment/Plan: 1. Sepsis- bacteremia w/ Bacteroides fragilis. Currently on Unasyn Repeat [facility] 04/13/18 no growth so far Tmax 101.1. WBC improved down to 13.8  2. ESRD - MWF - had HD Tues per holiday  Schedule - next HD Friday- schedule after stress test Friday. 3. Hypertension/volume - HD 11/24 Was supposed to run even per note but net UF 1081. Ran 3 hours. HR in 140s-probably D/T chills/fever. BP improved Tuesday - kept even on HD wt 63.6 4. Anemia CKD/ABLA/Hx of GI bleed. - HGB 8.5 after 2 units PRBCs 11/24.S/Pcolonoscopy 03/05/18 showing severe diverticulosis throughout the colon, single ulcer cecum/  biopsied.Tsat 11% Ferritin 2373.  Fe def in setting of cancer/GIB which is likely the cause of elevated ferritin- getting some Fe with binders - holding Aranesp for now per Dr. Burr Medico..  Pt will likely then require more frequent transfusions. 5. Metabolic bone disease -  ContVDRA/Binder. 6. Nutrition.Ablumin 1.7 Low albumin2/2 liver disease. Add prot supp- has had unintentional weight loss. 7. Cholangiocarcinoma - new dx 02/2018. Abd CT showing sig increase in size of liver mass in 1 mo interval. Followed by Dr. Burr Medico. Seen by Dr. Barry Dienes for possible surgical resection. Awaiting cardiac clearance .  No other chemo or cancer tx planned for now.  Possibly repeat CT chest to r/o mets before surgery/  Recommends avoiding EPO due to black box warning of stimulating tumor growth in cancer patients. 8. Hx of 20 year tobacco - quit last year   Myriam Jacobson, PA-C [facility] Beeper [phone] 04/14/2018,9:35 AM  LOS: 5 days   Pt seen, examined and agree w A/P as above.  Fevers/^WBC resolving, feeling better. For stress test today and  eventually surgery for the biliary malignancy.  [name] MD [facility] pager [phone]   04/14/2018, 11:31 AM    Subjective:   Feeling some better.  Has abdominal pain - hard to describe.  Trying to eat. Trying to drink at least one Nepro a day. Tells me she is scheduled for stress test on Friday.   Objective Vitals:   04/13/18 1742 04/13/18 2235 04/14/18 0551 04/14/18 0900  BP: 133/74 119/82 111/69 126/77  Pulse: 88 (!) 108 86 89  Resp: 20  17 18   Temp: 98.7 F (37.1 C) (!) 101.1 F (38.4 C) 98.6 F (37 C) 98.4 F (36.9 C)  TempSrc: Oral Oral Oral Oral  SpO2: 98% 100% 97% 97%  Weight: 63.6 kg     Height:       Physical Exam General: NAD sitting in bed Heart: RRR 2/6 murmur Lungs: no rales breathing easily  Abdomen: soft abdominal RUQ tenderness, + BS Extremities: no LE edema Dialysis Access: left upper AVF + bruit   Additional Objective Labs: Basic Metabolic Panel: Recent Labs  Lab 04/11/18 0612 04/12/18 0508 04/13/18 0641  NA 136 137 137  K 3.5 3.3* 3.5  CL 101 102 102  CO2 26 26 26   GLUCOSE 101* 84 70  BUN 35* 21 34*  CREATININE 6.32* 4.53* 6.18*  CALCIUM 7.2* 7.3* 7.9*  PHOS 3.9  --   --    Liver Function Tests: Recent Labs  Lab 04/09/18 0704 04/11/18 0612 04/12/18 0508 04/13/18 0641  AST 48*  --  31 26  ALT  20  --  17 17  ALKPHOS 147*  --  103 123  BILITOT 0.5  --  0.6 0.6  PROT 7.2  --  5.6* 5.8*  ALBUMIN 2.3* 1.6* 1.5* 1.7*   No results for input(s): LIPASE, AMYLASE in the last 168 hours. CBC: Recent Labs  Lab 04/09/18 0704  04/10/18 0954  04/11/18 0612 04/12/18 0508 04/13/18 0641 04/14/18 0525  WBC 11.9*   < > 13.7*  --  13.4* 25.3* 20.8* 13.8*  NEUTROABS 11.2*  --   --   --   --   --   --   --   HGB 8.0*   < > 6.4*   < > 7.6* 8.5* 8.6* 8.5*  HCT 26.5*   < > 20.7*   < > 24.7* 26.7* 27.8* 27.4*  MCV 98.9   < > 95.0  --  96.1 92.7 93.9 95.1  PLT 446*   < > 305  --  306 290 362 378   < > = values in this  interval not displayed.   Blood Culture    Component Value Date/Time   SDES BLOOD RIGHT HAND 04/13/2018 0652   SPECREQUEST  04/13/2018 0938    BOTTLES DRAWN AEROBIC AND ANAEROBIC Blood Culture results may not be optimal due to an excessive volume of blood received in culture bottles   CULT  04/13/2018 0652    NO GROWTH < 24 HOURS Performed at [facility], [place] [address], [place], [place] [address]    REPTSTATUS PENDING 04/13/2018 3716    Cardiac Enzymes: No results for input(s): CKTOTAL, CKMB, CKMBINDEX, TROPONINI in the last 168 hours. CBG: No results for input(s): GLUCAP in the last 168 hours. Iron Studies: No results for input(s): IRON, TIBC, TRANSFERRIN, FERRITIN in the last 72 hours. Lab Results  Component Value Date   INR 1.23 04/10/2018   INR 1.17 02/26/2018   INR 0.97 03/18/2017   Studies/Results: Dg Chest 2 View  Result Date: 04/12/2018 CLINICAL DATA:  Cough.  Nausea, vomiting, and diarrhea. EXAM: CHEST - 2 VIEW COMPARISON:  04/09/2018 FINDINGS: Borderline enlargement of the cardiopericardial silhouette, without edema. The lungs appear otherwise clear. No blunting of the costophrenic angles. IMPRESSION: 1. Borderline enlargement of the cardiopericardial silhouette. Otherwise, no significant abnormalities are observed. Electronically Signed   By: Van Clines M.D.   On: 04/12/2018 20:17   US Abdomen Limited Ruq  Result Date: 04/12/2018 CLINICAL DATA:  Initial evaluation for cholangiocarcinoma. EXAM: ULTRASOUND ABDOMEN LIMITED RIGHT UPPER QUADRANT COMPARISON:  Prior CT from 04/09/2018. FINDINGS: Gallbladder: Not visualized. Common bile duct: Diameter: 4 mm Liver: Liver demonstrates a heterogeneous echotexture. Multiple heterogeneous irregular hypoechoic masses present within the central aspect of the liver, consistent with biopsy proven adenocarcinoma. These measure 3.3 x 2.7 x 3.0 cm, 2.4 x 1.6 x 1.6 cm, and 4.0 x 3.6 x 2.4 cm. Although not well delineated by  ultrasound, these are suspected to be contiguous with 1 another. Portal vein is patent on color Doppler imaging with normal direction of blood flow towards the liver. Possible trace free fluid within the adjacent right upper quadrant. Increased echogenicity within the visualized right kidney, suggesting medical renal disease. IMPRESSION: 1. Multifocal intrahepatic mass, consistent with biopsy proven adenocarcinoma. This is suspected be little interval changed relative to recent CT from 3 days prior, better evaluated on previous exam. 2. Main portal vein remains patent with no portal venous invasion by sonography. 3. No biliary dilatation. Electronically Signed   By: Jeannine Boga  M.D.   On: 04/12/2018 17:48   Medications: . sodium chloride Stopped (04/09/18 1208)  . ampicillin-sulbactam (UNASYN) IV 3 g (04/13/18 1831)   . Chlorhexidine Gluconate Cloth  6 each Topical Q0600  . darbepoetin (ARANESP) injection - DIALYSIS  150 mcg Intravenous Once  . doxercalciferol  2 mcg Intravenous Q Wed-HD  . feeding supplement (NEPRO CARB STEADY)  237 mL Oral BID BM  . feeding supplement (PRO-STAT SUGAR FREE 64)  30 mL Oral BID  . folic acid  1 mg Oral Daily  . heparin injection (subcutaneous)  5,000 Units Subcutaneous Q8H  . ondansetron (ZOFRAN) IV  4 mg Intravenous Once  . pantoprazole  40 mg Oral BID  . senna-docusate  2 tablet Oral BID  . [START ON 04/15/2018] Vitamin D (Ergocalciferol)  50,000 Units Oral Weekly

## 2018-04-14 NOTE — Progress Notes (Signed)
PROGRESS NOTE  Mary Chapman MLY:[id number] DOB: 24-Jul-1945 DOA: 04/09/2018 PCP: Glendon Axe, MD  HPI/Recap of past 32 hours: 72 year old female with past medical history of end-stage renal disease on hemodialysis Monday Wednesday Friday, recent diagnosis of cholangiocarcinoma, started to follow with Dr. Burr Medico, she presents with fever and chills, her work-up significant for bacteroides fragilis bacteremia, likely source would be her cholangiocarcinoma.  Work-up also significant for anemia, no evidence of significant GI bleed, she required 2 units PRBC transfusion, suspected 2/2 to chronic disease ESRD. Her hemoglobin remained stable.  04/14/18: Seen and examined at her bedside.  Has mild tenderness on her right upper quadrant after taking her pain meds.  Stress test planned tomorrow by cardiology to clear her for surgery.  Discussed with Dr. Barry Dienes, general surgery, regarding possible surgery on Friday at [facility]. Patient will be transferred to tertiary care [facility] after her stress test tomorrow. This has been arranged by General surgery. Highly appreciated.  Receiving physician is Dr. Dewaine Conger of general surgery at [facility].  Assessment/Plan: Principal Problem:   Sepsis ([place]) Active Problems:   Anasarca   Tobacco abuse   Essential hypertension   ESRD on dialysis ([place])   Cholangiocarcinoma ([place])  Sepsis due to Bacteroides fragilis bacteremia -Presents with fever, leukocytosis, elevated lactic acid. -Bacteroides fragilis on blood cultures most likely related to abdominal source from her cholangiocarcinoma. -RUQ pain, on opiates for chronic pain  Cholangiocarcinoma  -she followes With Dr. Burr Medico of oncology and Dr Barry Dienes of Gen surgery Will have stress test done tomorrow for cardiology clearance After cardio clearance will be transferred to [facility] for surgery (possibly on Friday) Transfer at [facility] by Dr Barry Dienes  Nausea and vomiting - Improved , initially with oral intake  Uncontrolled  hypertension -Continue to hold meds as blood pressure remains soft  Anemia of chronic kidney disease 2/2 ESRD -No signs of overt GI bleed, but recent endoscopy/colonoscopy significant for gastritis, duodenitis, and cecal ulcer, overall no evidence of significant GI bleed, she was transfused 2 units PRBC so far with good response  -Low folic acid, started on supplements -No overt signs of GI bleed  Moderate protein calorie malnutrition -Low albumin, appetite is good, started on Nepro  Tobacco use disorder  End-stage renal disease  - on Dialysis, keep schedule   Code Status : full  Consults  :  Renal, Gen surgery  Procedures  : none       Objective: Vitals:   04/13/18 1742 04/13/18 2235 04/14/18 0551 04/14/18 0900  BP: 133/74 119/82 111/69 126/77  Pulse: 88 (!) 108 86 89  Resp: 20  17 18   Temp: 98.7 F (37.1 C) (!) 101.1 F (38.4 C) 98.6 F (37 C) 98.4 F (36.9 C)  TempSrc: Oral Oral Oral Oral  SpO2: 98% 100% 97% 97%  Weight: 63.6 kg     Height:        Intake/Output Summary (Last 24 hours) at 04/14/2018 1534 Last data filed at 04/14/2018 1418 Gross per 24 hour  Intake 457 ml  Output 0 ml  Net 457 ml   Filed Weights   04/12/18 2131 04/13/18 1330 04/13/18 1742  Weight: 65.4 kg 63.5 kg 63.6 kg    Exam:  . General: 72 y.o. year-old female well developed well nourished in no acute distress.  Alert and oriented x3. . Cardiovascular: Regular rate and rhythm with no rubs or gallops.  No thyromegaly or JVD noted.   Marland Kitchen Respiratory: Clear to auscultation with no wheezes or rales. Good inspiratory  effort. . Abdomen: Soft RUQ tenderness on palpation. Normal bowel sounds x4 quadrants. . Musculoskeletal: No lower extremity edema. 2/4 pulses in all 4 extremities. Marland Kitchen Psychiatry: Mood is appropriate for condition and setting   Data Reviewed: CBC: Recent Labs  Lab 04/09/18 0704  04/10/18 0954 04/10/18 1919 04/11/18 0612 04/12/18 0508 04/13/18 0641  04/14/18 0525  WBC 11.9*   < > 13.7*  --  13.4* 25.3* 20.8* 13.8*  NEUTROABS 11.2*  --   --   --   --   --   --   --   HGB 8.0*   < > 6.4* 7.6* 7.6* 8.5* 8.6* 8.5*  HCT 26.5*   < > 20.7* 24.1* 24.7* 26.7* 27.8* 27.4*  MCV 98.9   < > 95.0  --  96.1 92.7 93.9 95.1  PLT 446*   < > 305  --  306 290 362 378   < > = values in this interval not displayed.   Basic Metabolic Panel: Recent Labs  Lab 04/09/18 0704 04/10/18 0608 04/11/18 0612 04/12/18 0508 04/13/18 0641  NA 137 132* 136 137 137  K 4.2 3.2* 3.5 3.3* 3.5  CL 93* 96* 101 102 102  CO2 22 27 26 26 26   GLUCOSE 135* 105* 101* 84 70  BUN 58* 17 35* 21 34*  CREATININE 11.75* 5.19* 6.32* 4.53* 6.18*  CALCIUM 7.5* 6.8* 7.2* 7.3* 7.9*  PHOS  --   --  3.9  --   --    GFR: Estimated Creatinine Clearance: 7.9 mL/min (A) (by C-G formula based on SCr of 6.18 mg/dL (H)). Liver Function Tests: Recent Labs  Lab 04/09/18 0704 04/11/18 0612 04/12/18 0508 04/13/18 0641  AST 48*  --  31 26  ALT 20  --  17 17  ALKPHOS 147*  --  103 123  BILITOT 0.5  --  0.6 0.6  PROT 7.2  --  5.6* 5.8*  ALBUMIN 2.3* 1.6* 1.5* 1.7*   No results for input(s): LIPASE, AMYLASE in the last 168 hours. No results for input(s): AMMONIA in the last 168 hours. Coagulation Profile: Recent Labs  Lab 04/10/18 0608  INR 1.23   Cardiac Enzymes: No results for input(s): CKTOTAL, CKMB, CKMBINDEX, TROPONINI in the last 168 hours. BNP (last 3 results) No results for input(s): PROBNP in the last 8760 hours. HbA1C: No results for input(s): HGBA1C in the last 72 hours. CBG: No results for input(s): GLUCAP in the last 168 hours. Lipid Profile: No results for input(s): CHOL, HDL, LDLCALC, TRIG, CHOLHDL, LDLDIRECT in the last 72 hours. Thyroid Function Tests: No results for input(s): TSH, T4TOTAL, FREET4, T3FREE, THYROIDAB in the last 72 hours. Anemia Panel: No results for input(s): VITAMINB12, FOLATE, FERRITIN, TIBC, IRON, RETICCTPCT in the last 72 hours. Urine  analysis:    Component Value Date/Time   COLORURINE YELLOW 03/14/2017 2330   APPEARANCEUR CLOUDY (A) 03/14/2017 2330   LABSPEC 1.020 03/14/2017 2330   PHURINE 6.0 03/14/2017 2330   GLUCOSEU 100 (A) 03/14/2017 2330   HGBUR MODERATE (A) 03/14/2017 2330   BILIRUBINUR NEGATIVE 03/14/2017 2330   KETONESUR NEGATIVE 03/14/2017 2330   PROTEINUR >300 (A) 03/14/2017 2330   NITRITE NEGATIVE 03/14/2017 2330   LEUKOCYTESUR NEGATIVE 03/14/2017 2330   Sepsis Labs: @LABRCNTIP (procalcitonin:4,lacticidven:4)  ) Recent Results (from the past 240 hour(s))  Blood Culture (routine x 2)     Status: Abnormal   Collection Time: 04/09/18  7:05 AM  Result Value Ref Range Status   Specimen Description   Final  BLOOD RIGHT FOREARM Performed at [facility], [address] [place], [place] [address]    Special Requests   Final    BOTTLES DRAWN AEROBIC AND ANAEROBIC Blood Culture adequate volume Performed at [facility], [address] [place], [place] [address]    Culture  Setup Time   Final    GRAM NEGATIVE RODS ANAEROBIC BOTTLE ONLY CRITICAL RESULT CALLED TO, READ BACK BY AND VERIFIED WITH: [place] [phone] FCP    Culture (A)  Final    BACTEROIDES FRAGILIS BETA LACTAMASE POSITIVE Performed at [facility], [place] [address], [place], [place] [address]    Report Status 04/12/2018 FINAL  Final  Blood Culture ID Panel (Reflexed)     Status: None   Collection Time: 04/09/18  7:05 AM  Result Value Ref Range Status   Enterococcus species NOT DETECTED NOT DETECTED Final   Listeria monocytogenes NOT DETECTED NOT DETECTED Final   Staphylococcus species NOT DETECTED NOT DETECTED Final   Staphylococcus aureus (BCID) NOT DETECTED NOT DETECTED Final   Streptococcus species NOT DETECTED NOT DETECTED Final   Streptococcus agalactiae NOT DETECTED NOT DETECTED Final   Streptococcus pneumoniae NOT DETECTED NOT DETECTED Final   Streptococcus pyogenes NOT DETECTED NOT  DETECTED Final   Acinetobacter baumannii NOT DETECTED NOT DETECTED Final   Enterobacteriaceae species NOT DETECTED NOT DETECTED Final   Enterobacter cloacae complex NOT DETECTED NOT DETECTED Final   Escherichia coli NOT DETECTED NOT DETECTED Final   Klebsiella oxytoca NOT DETECTED NOT DETECTED Final   Klebsiella pneumoniae NOT DETECTED NOT DETECTED Final   Proteus species NOT DETECTED NOT DETECTED Final   Serratia marcescens NOT DETECTED NOT DETECTED Final   Haemophilus influenzae NOT DETECTED NOT DETECTED Final   Neisseria meningitidis NOT DETECTED NOT DETECTED Final   Pseudomonas aeruginosa NOT DETECTED NOT DETECTED Final   Candida albicans NOT DETECTED NOT DETECTED Final   Candida glabrata NOT DETECTED NOT DETECTED Final   Candida krusei NOT DETECTED NOT DETECTED Final   Candida parapsilosis NOT DETECTED NOT DETECTED Final   Candida tropicalis NOT DETECTED NOT DETECTED Final    Comment: Performed at [facility], [place] [address], [place], [place] [address]  Blood Culture (routine x 2)     Status: Abnormal   Collection Time: 04/09/18  7:13 AM  Result Value Ref Range Status   Specimen Description   Final    BLOOD RIGHT ARM Performed at [facility], [address] [place], [place] [address]    Special Requests   Final    BOTTLES DRAWN AEROBIC AND ANAEROBIC Blood Culture adequate volume Performed at [facility], [address] [place], [place] [address]    Culture  Setup Time   Final    GRAM NEGATIVE RODS AEROBIC BOTTLE ONLY CRITICAL RESULT CALLED TO, READ BACK BY AND VERIFIED WITH: [place] [id number] [phone] FCP    Culture (A)  Final    BACTEROIDES FRAGILIS BETA LACTAMASE POSITIVE Performed at [facility], [place] [address] [place], [place] [address]    Report Status 04/13/2018 FINAL  Final  Blood Culture ID Panel (Reflexed)     Status: None   Collection Time: 04/09/18  7:13 AM  Result Value Ref Range Status   Enterococcus species  NOT DETECTED NOT DETECTED Final    Comment: CRITICAL RESULT CALLED TO, READ BACK BY AND VERIFIED WITH: [place] [id number]  FCP    Listeria monocytogenes NOT DETECTED NOT DETECTED Final   Staphylococcus species NOT DETECTED NOT DETECTED Final   Staphylococcus aureus (BCID) NOT DETECTED NOT DETECTED Final   Streptococcus species NOT DETECTED NOT DETECTED Final   Streptococcus agalactiae NOT DETECTED NOT DETECTED Final   Streptococcus pneumoniae NOT DETECTED NOT DETECTED Final   Streptococcus pyogenes NOT DETECTED NOT DETECTED Final   Acinetobacter baumannii NOT DETECTED NOT DETECTED Final   Enterobacteriaceae species NOT DETECTED NOT DETECTED Final   Enterobacter cloacae complex NOT DETECTED NOT DETECTED Final   Escherichia coli NOT DETECTED NOT DETECTED Final   Klebsiella oxytoca NOT DETECTED NOT DETECTED Final   Klebsiella pneumoniae NOT DETECTED NOT DETECTED Final   Proteus species NOT DETECTED NOT DETECTED Final   Serratia marcescens NOT DETECTED NOT DETECTED Final   Haemophilus influenzae NOT DETECTED NOT DETECTED Final   Neisseria meningitidis NOT DETECTED NOT DETECTED Final   Pseudomonas aeruginosa NOT DETECTED NOT DETECTED Final   Candida albicans NOT DETECTED NOT DETECTED Final   Candida glabrata NOT DETECTED NOT DETECTED Final   Candida krusei NOT DETECTED NOT DETECTED Final   Candida parapsilosis NOT DETECTED NOT DETECTED Final   Candida tropicalis NOT DETECTED NOT DETECTED Final    Comment: Performed at [facility], [place] [address], [place], [place] [address]  Culture, blood (Routine X 2) w Reflex to ID Panel     Status: None (Preliminary result)   Collection Time: 04/13/18  6:48 AM  Result Value Ref Range Status   Specimen Description BLOOD RIGHT ANTECUBITAL  Final   Special Requests   Final    BOTTLES DRAWN AEROBIC AND ANAEROBIC Blood Culture results may not be optimal due to an excessive volume of blood received in culture bottles   Culture   Final     NO GROWTH 1 DAY Performed at [facility], [place] [address], [place], [place] [address]    Report Status PENDING  Incomplete  Culture, blood (Routine X 2) w Reflex to ID Panel     Status: None (Preliminary result)   Collection Time: 04/13/18  6:52 AM  Result Value Ref Range Status   Specimen Description BLOOD RIGHT HAND  Final   Special Requests   Final    BOTTLES DRAWN AEROBIC AND ANAEROBIC Blood Culture results may not be optimal due to an excessive volume of blood received in culture bottles   Culture   Final    NO GROWTH 1 DAY Performed at [facility], [place] [address] [place], [place] [address]    Report Status PENDING  Incomplete      Studies: No results found.  Scheduled Meds: . Chlorhexidine Gluconate Cloth  6 each Topical Q0600  . doxercalciferol  2 mcg Intravenous Q Wed-HD  . feeding supplement (NEPRO CARB STEADY)  237 mL Oral BID BM  . feeding supplement (PRO-STAT SUGAR FREE 64)  30 mL Oral BID  . folic acid  1 mg Oral Daily  . heparin injection (subcutaneous)  5,000 Units Subcutaneous Q8H  . ondansetron (ZOFRAN) IV  4 mg Intravenous Once  . pantoprazole  40 mg Oral BID  . senna-docusate  2 tablet Oral BID  . [START ON 04/15/2018] Vitamin D (Ergocalciferol)  50,000 Units Oral Weekly    Continuous Infusions: . sodium chloride Stopped (04/09/18 1208)  . ampicillin-sulbactam (UNASYN) IV 3 g (04/14/18 1418)     LOS: 5 days     Kayleen Memos, MD [facility] Pager [phone]  If 7PM-7AM, please contact night-coverage [email or web address] Password [facility] 04/14/2018, 3:34 PM

## 2018-04-14 NOTE — Progress Notes (Signed)
Physical Therapy Treatment Patient Details Name: Mary Chapman MRN: [id number] DOB: Nov 13, 1945 Today's Date: 04/14/2018    History of Present Illness Pt is a 72 y.o. female admitted for sepsis after having rigors and fever during HD. US abdomen abnormal for intrahepatic mass and consistent with biopsy revealing adenocarcinoma. PMH includes ESRD, HTN, diverticulitis, anemia, cancer and tobacco use.     PT Comments    Session focused on continued progression of activity tolerance and functional mobility. Patient is progressing very well towards their physical therapy goals and remains very motivated to participate. Increased ambulation distance to 400 feet using walker and stand by assist. HR 100 bpm. Patient continues with decreased gait speed and endurance. Will progress as tolerated.    Follow Up Recommendations  Home health PT     Equipment Recommendations  Rolling walker with 5" wheels    Recommendations for Other Services       Precautions / Restrictions Precautions Precautions: Fall Restrictions Weight Bearing Restrictions: No    Mobility  Bed Mobility Overal bed mobility: Modified Independent             General bed mobility comments: no physical assistance required  Transfers Overall transfer level: Needs assistance Equipment used: Rolling walker (2 wheeled) Transfers: Sit to/from Stand Sit to Stand: Supervision         General transfer comment: Good technique utilized  Ambulation/Gait Ambulation/Gait assistance: Supervision Gait Distance (Feet): 400 Feet Assistive device: Rolling walker (2 wheeled) Gait Pattern/deviations: Step-through pattern;Decreased stride length;Narrow base of support   Gait velocity interpretation: <1.31 ft/sec, indicative of household ambulator General Gait Details: Patient with slow cadence but no gross unsteadiness noted. Slight difficulty with negotiating turns. Still with moderate reliance through BUE's on  walker   Stairs             Wheelchair Mobility    Modified Rankin (Stroke Patients Only)       Balance Overall balance assessment: Needs assistance Sitting-balance support: Feet supported;No upper extremity supported Sitting balance-Leahy Scale: Good     Standing balance support: No upper extremity supported;During functional activity Standing balance-Leahy Scale: Good                              Cognition Arousal/Alertness: Awake/alert Behavior During Therapy: WFL for tasks assessed/performed Overall Cognitive Status: Within Functional Limits for tasks assessed                                        Exercises      General Comments        Pertinent Vitals/Pain Pain Assessment: No/denies pain    Home Living                      Prior Function            PT Goals (current goals can now be found in the care plan section) Acute Rehab PT Goals Patient Stated Goal: walk further Potential to Achieve Goals: Good Progress towards PT goals: Progressing toward goals    Frequency    Min 3X/week      PT Plan Current plan remains appropriate    Co-evaluation              AM-PAC PT "6 Clicks" Mobility   Outcome Measure  Help needed turning from your back to your side  while in a flat bed without using bedrails?: None Help needed moving from lying on your back to sitting on the side of a flat bed without using bedrails?: None Help needed moving to and from a bed to a chair (including a wheelchair)?: None Help needed standing up from a chair using your arms (e.g., wheelchair or bedside chair)?: None Help needed to walk in hospital room?: None Help needed climbing 3-5 steps with a railing? : A Little 6 Click Score: 23    End of Session   Activity Tolerance: Patient tolerated treatment well Patient left: in chair;with call bell/phone within reach Nurse Communication: Mobility status PT Visit Diagnosis:  Muscle weakness (generalized) (M62.81);Difficulty in walking, not elsewhere classified (R26.2);Unsteadiness on feet (R26.81)     Time: 1779-3903 PT Time Calculation (min) (ACUTE ONLY): 25 min  Charges:  $Therapeutic Activity: 23-37 mins                     Ellamae Sia, [place], DPT Acute Rehabilitation Services Pager [phone] Office [phone]    Willy Eddy 04/14/2018, 11:19 AM

## 2018-04-14 NOTE — Progress Notes (Signed)
Mary Chapman   DOB:1945/11/22   YS#:[id number]   XNA#:[id number]  Oncology follow-up note  Subjective: Pt feels well today, afebrile and eating better.   Objective:  Vitals:   04/14/18 0900 04/14/18 1807  BP: 126/77 120/78  Pulse: 89 100  Resp: 18 16  Temp: 98.4 F (36.9 C) 100.3 F (37.9 C)  SpO2: 97% 98%    Body mass index is 22.29 kg/m.  Intake/Output Summary (Last 24 hours) at 04/14/2018 2122 Last data filed at 04/14/2018 1418 Gross per 24 hour  Intake 337 ml  Output -  Net 337 ml     Sclerae unicteric  Oropharynx clear  No peripheral adenopathy  Lungs clear -- no rales or rhonchi  Heart regular rate and rhythm  Abdomen benign  MSK no focal spinal tenderness, no peripheral edema  Neuro nonfocal   CBG (last 3)  No results for input(s): GLUCAP in the last 72 hours.   Labs:  Lab Results  Component Value Date   WBC 13.8 (H) 04/14/2018   HGB 8.5 (L) 04/14/2018   HCT 27.4 (L) 04/14/2018   MCV 95.1 04/14/2018   PLT 378 04/14/2018   NEUTROABS 11.2 (H) 04/09/2018   CMP Latest Ref Rng & Units 04/13/2018 04/12/2018 04/11/2018  Glucose 70 - 99 mg/dL 70 84 101(H)  BUN 8 - 23 mg/dL 34(H) 21 35(H)  Creatinine 0.44 - 1.00 mg/dL 6.18(H) 4.53(H) 6.32(H)  Sodium 135 - 145 mmol/L 137 137 136  Potassium 3.5 - 5.1 mmol/L 3.5 3.3(L) 3.5  Chloride 98 - 111 mmol/L 102 102 101  CO2 22 - 32 mmol/L 26 26 26   Calcium 8.9 - 10.3 mg/dL 7.9(L) 7.3(L) 7.2(L)  Total Protein 6.5 - 8.1 g/dL 5.8(L) 5.6(L) -  Total Bilirubin 0.3 - 1.2 mg/dL 0.6 0.6 -  Alkaline Phos 38 - 126 U/L 123 103 -  AST 15 - 41 U/L 26 31 -  ALT 0 - 44 U/L 17 17 -     Urine Studies No results for input(s): UHGB, CRYS in the last 72 hours.  Invalid input(s): UACOL, UAPR, USPG, UPH, UTP, UGL, UKET, UBIL, UNIT, UROB, [place], UEPI, UWBC, Duwayne Heck [place], [place]  Basic Metabolic Panel: Recent Labs  Lab 04/09/18 0704 04/10/18 0608 04/11/18 0612 04/12/18 0508 04/13/18 0641  NA 137 132* 136 137 137  K  4.2 3.2* 3.5 3.3* 3.5  CL 93* 96* 101 102 102  CO2 22 27 26 26 26   GLUCOSE 135* 105* 101* 84 70  BUN 58* 17 35* 21 34*  CREATININE 11.75* 5.19* 6.32* 4.53* 6.18*  CALCIUM 7.5* 6.8* 7.2* 7.3* 7.9*  PHOS  --   --  3.9  --   --    GFR Estimated Creatinine Clearance: 7.9 mL/min (A) (by C-G formula based on SCr of 6.18 mg/dL (H)). Liver Function Tests: Recent Labs  Lab 04/09/18 0704 04/11/18 0612 04/12/18 0508 04/13/18 0641  AST 48*  --  31 26  ALT 20  --  17 17  ALKPHOS 147*  --  103 123  BILITOT 0.5  --  0.6 0.6  PROT 7.2  --  5.6* 5.8*  ALBUMIN 2.3* 1.6* 1.5* 1.7*   No results for input(s): LIPASE, AMYLASE in the last 168 hours. No results for input(s): AMMONIA in the last 168 hours. Coagulation profile Recent Labs  Lab 04/10/18 0608  INR 1.23    CBC: Recent Labs  Lab 04/09/18 0704  04/10/18 0954 04/10/18 1919 04/11/18 0612 04/12/18 5427 04/13/18 0623 04/14/18 7628  WBC 11.9*   < > 13.7*  --  13.4* 25.3* 20.8* 13.8*  NEUTROABS 11.2*  --   --   --   --   --   --   --   HGB 8.0*   < > 6.4* 7.6* 7.6* 8.5* 8.6* 8.5*  HCT 26.5*   < > 20.7* 24.1* 24.7* 26.7* 27.8* 27.4*  MCV 98.9   < > 95.0  --  96.1 92.7 93.9 95.1  PLT 446*   < > 305  --  306 290 362 378   < > = values in this interval not displayed.   Cardiac Enzymes: No results for input(s): CKTOTAL, CKMB, CKMBINDEX, TROPONINI in the last 168 hours. BNP: Invalid input(s): POCBNP CBG: No results for input(s): GLUCAP in the last 168 hours. D-Dimer No results for input(s): DDIMER in the last 72 hours. Hgb A1c No results for input(s): HGBA1C in the last 72 hours. Lipid Profile No results for input(s): CHOL, HDL, LDLCALC, TRIG, CHOLHDL, LDLDIRECT in the last 72 hours. Thyroid function studies No results for input(s): TSH, T4TOTAL, T3FREE, THYROIDAB in the last 72 hours.  Invalid input(s): FREET3 Anemia work up No results for input(s): VITAMINB12, FOLATE, FERRITIN, TIBC, IRON, RETICCTPCT in the last 72  hours. Microbiology Recent Results (from the past 240 hour(s))  Blood Culture (routine x 2)     Status: Abnormal   Collection Time: 04/09/18  7:05 AM  Result Value Ref Range Status   Specimen Description   Final    BLOOD RIGHT FOREARM Performed at [facility], [address] [place], [place] [address]    Special Requests   Final    BOTTLES DRAWN AEROBIC AND ANAEROBIC Blood Culture adequate volume Performed at [facility], [address] [place], [place] [address]    Culture  Setup Time   Final    GRAM NEGATIVE RODS ANAEROBIC BOTTLE ONLY CRITICAL RESULT CALLED TO, READ BACK BY AND VERIFIED WITH: Elizabeth [phone] FCP    Culture (A)  Final    BACTEROIDES FRAGILIS BETA LACTAMASE POSITIVE Performed at [facility], [place] [address], [place], [place] [address]    Report Status 04/12/2018 FINAL  Final  Blood Culture ID Panel (Reflexed)     Status: None   Collection Time: 04/09/18  7:05 AM  Result Value Ref Range Status   Enterococcus species NOT DETECTED NOT DETECTED Final   Listeria monocytogenes NOT DETECTED NOT DETECTED Final   Staphylococcus species NOT DETECTED NOT DETECTED Final   Staphylococcus aureus (BCID) NOT DETECTED NOT DETECTED Final   Streptococcus species NOT DETECTED NOT DETECTED Final   Streptococcus agalactiae NOT DETECTED NOT DETECTED Final   Streptococcus pneumoniae NOT DETECTED NOT DETECTED Final   Streptococcus pyogenes NOT DETECTED NOT DETECTED Final   Acinetobacter baumannii NOT DETECTED NOT DETECTED Final   Enterobacteriaceae species NOT DETECTED NOT DETECTED Final   Enterobacter cloacae complex NOT DETECTED NOT DETECTED Final   Escherichia coli NOT DETECTED NOT DETECTED Final   Klebsiella oxytoca NOT DETECTED NOT DETECTED Final   Klebsiella pneumoniae NOT DETECTED NOT DETECTED Final   Proteus species NOT DETECTED NOT DETECTED Final   Serratia marcescens NOT DETECTED NOT DETECTED Final   Haemophilus influenzae  NOT DETECTED NOT DETECTED Final   Neisseria meningitidis NOT DETECTED NOT DETECTED Final   Pseudomonas aeruginosa NOT DETECTED NOT DETECTED Final   Candida albicans NOT DETECTED NOT DETECTED Final   Candida glabrata NOT DETECTED NOT DETECTED  Final   Candida krusei NOT DETECTED NOT DETECTED Final   Candida parapsilosis NOT DETECTED NOT DETECTED Final   Candida tropicalis NOT DETECTED NOT DETECTED Final    Comment: Performed at [facility], [place] [address] [place], [place] [address]  Blood Culture (routine x 2)     Status: Abnormal   Collection Time: 04/09/18  7:13 AM  Result Value Ref Range Status   Specimen Description   Final    BLOOD RIGHT ARM Performed at [facility], [address] [place], [place] [address]    Special Requests   Final    BOTTLES DRAWN AEROBIC AND ANAEROBIC Blood Culture adequate volume Performed at [facility], [address] [place], [place] [address]    Culture  Setup Time   Final    GRAM NEGATIVE RODS AEROBIC BOTTLE ONLY CRITICAL RESULT CALLED TO, READ BACK BY AND VERIFIED WITH: [place] [id number] [phone] FCP    Culture (A)  Final    BACTEROIDES FRAGILIS BETA LACTAMASE POSITIVE Performed at [facility], [place] [address] [place], [place] [address]    Report Status 04/13/2018 FINAL  Final  Blood Culture ID Panel (Reflexed)     Status: None   Collection Time: 04/09/18  7:13 AM  Result Value Ref Range Status   Enterococcus species NOT DETECTED NOT DETECTED Final    Comment: CRITICAL RESULT CALLED TO, READ BACK BY AND VERIFIED WITH: PHARM D JAMES LEDFORD [phone] FCP    Listeria monocytogenes NOT DETECTED NOT DETECTED Final   Staphylococcus species NOT DETECTED NOT DETECTED Final   Staphylococcus aureus (BCID) NOT DETECTED NOT DETECTED Final   Streptococcus species NOT DETECTED NOT DETECTED Final   Streptococcus agalactiae NOT DETECTED NOT DETECTED Final   Streptococcus pneumoniae NOT DETECTED NOT DETECTED  Final   Streptococcus pyogenes NOT DETECTED NOT DETECTED Final   Acinetobacter baumannii NOT DETECTED NOT DETECTED Final   Enterobacteriaceae species NOT DETECTED NOT DETECTED Final   Enterobacter cloacae complex NOT DETECTED NOT DETECTED Final   Escherichia coli NOT DETECTED NOT DETECTED Final   Klebsiella oxytoca NOT DETECTED NOT DETECTED Final   Klebsiella pneumoniae NOT DETECTED NOT DETECTED Final   Proteus species NOT DETECTED NOT DETECTED Final   Serratia marcescens NOT DETECTED NOT DETECTED Final   Haemophilus influenzae NOT DETECTED NOT DETECTED Final   Neisseria meningitidis NOT DETECTED NOT DETECTED Final   Pseudomonas aeruginosa NOT DETECTED NOT DETECTED Final   Candida albicans NOT DETECTED NOT DETECTED Final   Candida glabrata NOT DETECTED NOT DETECTED Final   Candida krusei NOT DETECTED NOT DETECTED Final   Candida parapsilosis NOT DETECTED NOT DETECTED Final   Candida tropicalis NOT DETECTED NOT DETECTED Final    Comment: Performed at [facility], [place] [address], [place], [place] [address]  Culture, blood (Routine X 2) w Reflex to ID Panel     Status: None (Preliminary result)   Collection Time: 04/13/18  6:48 AM  Result Value Ref Range Status   Specimen Description BLOOD RIGHT ANTECUBITAL  Final   Special Requests   Final    BOTTLES DRAWN AEROBIC AND ANAEROBIC Blood Culture results may not be optimal due to an excessive volume of blood received in culture bottles   Culture   Final    NO GROWTH 1 DAY Performed at [facility], [address], [place], [place] [address]    Report Status PENDING  Incomplete  Culture, blood (  Routine X 2) w Reflex to ID Panel     Status: None (Preliminary result)   Collection Time: 04/13/18  6:52 AM  Result Value Ref Range Status   Specimen Description BLOOD RIGHT HAND  Final   Special Requests   Final    BOTTLES DRAWN AEROBIC AND ANAEROBIC Blood Culture results may not be optimal due to an excessive volume of blood received  in culture bottles   Culture   Final    NO GROWTH 1 DAY Performed at [place] [address], [place], [place] [address]    Report Status PENDING  Incomplete      Studies:  No results found.  Assessment: 72 y.o. with PMH of end-stage renal disease on hemodialysis, recent diagnosed cholangiocarcinoma, untreated, presented with fever and sepsis.  1. Sepsis due to Bacteroides fragilis bacteremia 2.  Recently diagnosed cholangiocarcinoma, pending surgery 3.  Anemia of chronic disease, secondary to end-stage renal disease and mal;ignancy, s/p blood transfusion  4. HTN   Plan:  -I have spoken with Dr. Barry Dienes today, she has reviewed her recent CT scan and recommends transferring her to [facility] Cloyd Stagers for surgery. She has made arrangement and spoke with Dr. [place] Crane -I explained the above to pt, she is agreeable, and appreciative  -she will do stress test tomorrow  -I will f/u her after her surgery.    Truitt Merle, MD 04/14/2018

## 2018-04-15 DIAGNOSIS — Z01810 Encounter for preprocedural cardiovascular examination: Secondary | ICD-10-CM

## 2018-04-15 MED ORDER — CHLORHEXIDINE GLUCONATE CLOTH 2 % EX PADS: 6.0000 | MEDICATED_PAD | Freq: Every day | CUTANEOUS | Status: DC

## 2018-04-15 NOTE — Progress Notes (Signed)
Pharmacy Antibiotic Note  Mary Chapman is a 72 y.o. female admitted on 04/09/2018 with sepsis and Bacteroides fragilis bacteremia. Pharmacy  consulted 04/12/18 for Unasyn dosing. Pt with history of ESRD - HD MWF. Afebrfile, Tm 100.6, WBC improved down to 13.8, ESRD on HD (missed HD on Wed).  Repeat BCx 11/26 ngtd   Plan: Continue Unasyn 3gm IV q24h F/u C&S, clinical status   Height: 5' 6.5" (168.9 cm) Weight: 140 lb 3.4 oz (63.6 kg) IBW/kg (Calculated) : 60.45  Temp (24hrs), Avg:99.6 F (37.6 C), Min:98.8 F (37.1 C), Max:100.6 F (38.1 C)  Recent Labs  Lab 04/09/18 0704 04/09/18 0706 04/09/18 0901 04/10/18 0608 04/10/18 0954 04/11/18 0612 04/12/18 0508 04/13/18 0641 04/14/18 0525  WBC 11.9*  --   --  13.8* 13.7* 13.4* 25.3* 20.8* 13.8*  CREATININE 11.75*  --   --  5.19*  --  6.32* 4.53* 6.18*  --   LATICACIDVEN  --  6.38* 1.36  --   --   --   --   --   --     Estimated Creatinine Clearance: 7.9 mL/min (A) (by C-G formula based on SCr of 6.18 mg/dL (H)).    No Known Allergies  Antimicrobials this admission: Vanc 11/22>>11/24 Cefepime 11/22>>11/25 Flagyl 11/22>>11/25 Unasyn 11/25>>  Dose adjustments this admission: N/A  Microbiology results: 1/22 BCx x2: B.fragilis, B lactamase positive 11/22 BCx x2: B.fragilis, B lactamase positive    BCID: nothing detected 11/26 BCx x2: ngtd 11/26 BCx x2: ngtd   Thank you for allowing pharmacy to be part of this patients care team. Nicole Cella, RPh Clinical Pharmacist Please utilize Amion for appropriate phone number to reach the unit pharmacist ([place]) 04/15/2018 12:19 PM

## 2018-04-15 NOTE — Progress Notes (Signed)
PROGRESS NOTE  Mary Chapman ZOX:[id number] DOB: 03/15/1946 DOA: 04/09/2018 PCP: Glendon Axe, MD  HPI/Recap of past 51 hours: 72 year old female with past medical history of end-stage renal disease on hemodialysis Monday Wednesday Friday, recent diagnosis of cholangiocarcinoma, started to follow with Dr. Burr Medico, she presents with fever and chills, her work-up significant for bacteroides fragilis bacteremia, likely source would be her cholangiocarcinoma.  Work-up also significant for anemia, no evidence of significant GI bleed, she required 2 units PRBC transfusion, suspected 2/2 to anemia of chronic disease ESRD and malignancy. Her hemoglobin remained stable.  Discussed with Dr. Barry Dienes, general surgery, regarding possible surgery at [facility]. Patient will be transferred to tertiary care [facility] after her stress test. This has been arranged by General surgery. Highly appreciated.  Receiving physician is Dr. Dewaine Conger of general surgery at [facility].  04/15/2018: Patient seen and examined at her bedside.  She has no new complaints.  No acute events overnight.  Stress test planned on 04/16/2018 by cardiology for preop.  Denies any chest pain, palpitations or dyspnea.  Abdominal pain is well controlled with current pain management.  Assessment/Plan: Principal Problem:   Sepsis ([place]) Active Problems:   Anasarca   Tobacco abuse   Essential hypertension   ESRD on dialysis ([place])   Cholangiocarcinoma ([place])  Sepsis due to Bacteroides fragilis bacteremia  suspect abdominal source from her cholangiocarcinoma -Sepsis physiology appears to be improving -Presented with fever, leukocytosis, elevated lactic acid. -Bacteroides fragilis on blood cultures most likely related to abdominal source from her cholangiocarcinoma. -RUQ pain, on opiates for chronic pain -Continue Unasyn day #4 -Repeated blood cultures done on 04/13/2018 no growth to date -Had a fever yesterday Evening 04/14/2018 with T-max of 100.6  Recently  diagnosed cholangiocarcinoma  -she follows With Dr. Burr Medico of oncology and Dr Barry Dienes of Gen surgery Will have stress test done on Friday, 04/16/2018 for preop cardiology clearance After cardio clearance will be transferred to [facility] for surgery  Transfer at [facility] already arranged by Dr Barry Dienes  Nausea and vomiting -Improving -IV Zofran as needed  Uncontrolled hypertension -Continue to hold meds as blood pressure remains soft  Anemia of chronic kidney disease 2/2 ESRD -No signs of overt GI bleed, but recent endoscopy/colonoscopy significant for gastritis, duodenitis, and cecal ulcer, overall no evidence of significant GI bleed, she was transfused 2 units PRBC so far with good response  -Low folic acid, started on supplements -No overt signs of GI bleed  Moderate protein calorie malnutrition -Low albumin, appetite is good, started on Nepro  Tobacco use disorder  End-stage renal disease on hemodialysis Monday Wednesday Friday -Had hemodialysis on Tuesday per holiday schedule next hemodialysis on Friday after her stress test    Code Status : full  Consults  :   Nephrology, Gen surgery  Procedures  : none       Objective: Vitals:   04/14/18 1807 04/14/18 2130 04/15/18 0447 04/15/18 0916  BP: 120/78 129/74 117/69 131/73  Pulse: 100 95 83 93  Resp: 16 16 16 18   Temp: 100.3 F (37.9 C) (!) 100.6 F (38.1 C) 98.8 F (37.1 C) 98.8 F (37.1 C)  TempSrc: Oral Oral Oral Oral  SpO2: 98% 96% 95% 96%  Weight:      Height:        Intake/Output Summary (Last 24 hours) at 04/15/2018 1240 Last data filed at 04/15/2018 0847 Gross per 24 hour  Intake 767.2 ml  Output 0 ml  Net 767.2 ml   Filed Weights   04/12/18  2131 04/13/18 1330 04/13/18 1742  Weight: 65.4 kg 63.5 kg 63.6 kg    Exam:  . General: 72 y.o. year-old female well-developed well-nourished in no acute distress.  Alert and oriented x3.   . Cardiovascular: Regular rate and rhythm with no rubs or gallops.   No JVD or thyromegaly noted.   Marland Kitchen Respiratory: Clear to auscultation with no wheezes or rales. Good inspiratory effort. . Abdomen: Soft RUQ mild tenderness on palpation. Normal bowel sounds x4 quadrants. . Musculoskeletal: No lower extremity edema. 2/4 pulses in all 4 extremities. Marland Kitchen Psychiatry: Mood is appropriate for condition and setting   Data Reviewed: CBC: Recent Labs  Lab 04/09/18 0704  04/10/18 0954 04/10/18 1919 04/11/18 0612 04/12/18 0508 04/13/18 0641 04/14/18 0525  WBC 11.9*   < > 13.7*  --  13.4* 25.3* 20.8* 13.8*  NEUTROABS 11.2*  --   --   --   --   --   --   --   HGB 8.0*   < > 6.4* 7.6* 7.6* 8.5* 8.6* 8.5*  HCT 26.5*   < > 20.7* 24.1* 24.7* 26.7* 27.8* 27.4*  MCV 98.9   < > 95.0  --  96.1 92.7 93.9 95.1  PLT 446*   < > 305  --  306 290 362 378   < > = values in this interval not displayed.   Basic Metabolic Panel: Recent Labs  Lab 04/09/18 0704 04/10/18 0608 04/11/18 0612 04/12/18 0508 04/13/18 0641  NA 137 132* 136 137 137  K 4.2 3.2* 3.5 3.3* 3.5  CL 93* 96* 101 102 102  CO2 22 27 26 26 26   GLUCOSE 135* 105* 101* 84 70  BUN 58* 17 35* 21 34*  CREATININE 11.75* 5.19* 6.32* 4.53* 6.18*  CALCIUM 7.5* 6.8* 7.2* 7.3* 7.9*  PHOS  --   --  3.9  --   --    GFR: Estimated Creatinine Clearance: 7.9 mL/min (A) (by C-G formula based on SCr of 6.18 mg/dL (H)). Liver Function Tests: Recent Labs  Lab 04/09/18 0704 04/11/18 0612 04/12/18 0508 04/13/18 0641  AST 48*  --  31 26  ALT 20  --  17 17  ALKPHOS 147*  --  103 123  BILITOT 0.5  --  0.6 0.6  PROT 7.2  --  5.6* 5.8*  ALBUMIN 2.3* 1.6* 1.5* 1.7*   No results for input(s): LIPASE, AMYLASE in the last 168 hours. No results for input(s): AMMONIA in the last 168 hours. Coagulation Profile: Recent Labs  Lab 04/10/18 0608  INR 1.23   Cardiac Enzymes: No results for input(s): CKTOTAL, CKMB, CKMBINDEX, TROPONINI in the last 168 hours. BNP (last 3 results) No results for input(s): PROBNP in the last  8760 hours. HbA1C: No results for input(s): HGBA1C in the last 72 hours. CBG: No results for input(s): GLUCAP in the last 168 hours. Lipid Profile: No results for input(s): CHOL, HDL, LDLCALC, TRIG, CHOLHDL, LDLDIRECT in the last 72 hours. Thyroid Function Tests: No results for input(s): TSH, T4TOTAL, FREET4, T3FREE, THYROIDAB in the last 72 hours. Anemia Panel: No results for input(s): VITAMINB12, FOLATE, FERRITIN, TIBC, IRON, RETICCTPCT in the last 72 hours. Urine analysis:    Component Value Date/Time   COLORURINE YELLOW 03/14/2017 2330   APPEARANCEUR CLOUDY (A) 03/14/2017 2330   LABSPEC 1.020 03/14/2017 2330   PHURINE 6.0 03/14/2017 2330   GLUCOSEU 100 (A) 03/14/2017 2330   HGBUR MODERATE (A) 03/14/2017 2330   BILIRUBINUR NEGATIVE 03/14/2017 2330   KETONESUR NEGATIVE  03/14/2017 2330   PROTEINUR >300 (A) 03/14/2017 2330   NITRITE NEGATIVE 03/14/2017 2330   LEUKOCYTESUR NEGATIVE 03/14/2017 2330   Sepsis Labs: @LABRCNTIP (procalcitonin:4,lacticidven:4)  ) Recent Results (from the past 240 hour(s))  Blood Culture (routine x 2)     Status: Abnormal   Collection Time: 04/09/18  7:05 AM  Result Value Ref Range Status   Specimen Description   Final    BLOOD RIGHT FOREARM Performed at [facility], [address] [place], [place] [address]    Special Requests   Final    BOTTLES DRAWN AEROBIC AND ANAEROBIC Blood Culture adequate volume Performed at [facility], [address] [place], [place] [address]    Culture  Setup Time   Final    GRAM NEGATIVE RODS ANAEROBIC BOTTLE ONLY CRITICAL RESULT CALLED TO, READ BACK BY AND VERIFIED WITH: [place] [phone] FCP    Culture (A)  Final    BACTEROIDES FRAGILIS BETA LACTAMASE POSITIVE Performed at [facility], [place] [address], [place], [place] [address]    Report Status 04/12/2018 FINAL  Final  Blood Culture ID Panel (Reflexed)     Status: None   Collection Time: 04/09/18  7:05 AM    Result Value Ref Range Status   Enterococcus species NOT DETECTED NOT DETECTED Final   Listeria monocytogenes NOT DETECTED NOT DETECTED Final   Staphylococcus species NOT DETECTED NOT DETECTED Final   Staphylococcus aureus (BCID) NOT DETECTED NOT DETECTED Final   Streptococcus species NOT DETECTED NOT DETECTED Final   Streptococcus agalactiae NOT DETECTED NOT DETECTED Final   Streptococcus pneumoniae NOT DETECTED NOT DETECTED Final   Streptococcus pyogenes NOT DETECTED NOT DETECTED Final   Acinetobacter baumannii NOT DETECTED NOT DETECTED Final   Enterobacteriaceae species NOT DETECTED NOT DETECTED Final   Enterobacter cloacae complex NOT DETECTED NOT DETECTED Final   Escherichia coli NOT DETECTED NOT DETECTED Final   Klebsiella oxytoca NOT DETECTED NOT DETECTED Final   Klebsiella pneumoniae NOT DETECTED NOT DETECTED Final   Proteus species NOT DETECTED NOT DETECTED Final   Serratia marcescens NOT DETECTED NOT DETECTED Final   Haemophilus influenzae NOT DETECTED NOT DETECTED Final   Neisseria meningitidis NOT DETECTED NOT DETECTED Final   Pseudomonas aeruginosa NOT DETECTED NOT DETECTED Final   Candida albicans NOT DETECTED NOT DETECTED Final   Candida glabrata NOT DETECTED NOT DETECTED Final   Candida krusei NOT DETECTED NOT DETECTED Final   Candida parapsilosis NOT DETECTED NOT DETECTED Final   Candida tropicalis NOT DETECTED NOT DETECTED Final    Comment: Performed at [facility], [place] [address] [place], [place] [address]  Blood Culture (routine x 2)     Status: Abnormal   Collection Time: 04/09/18  7:13 AM  Result Value Ref Range Status   Specimen Description   Final    BLOOD RIGHT ARM Performed at [facility], [address] [place], [place] [address]    Special Requests   Final    BOTTLES DRAWN AEROBIC AND ANAEROBIC Blood Culture adequate volume Performed at [facility], [address] [place], [place] [address]    Culture  Setup Time    Final    GRAM NEGATIVE RODS AEROBIC BOTTLE ONLY CRITICAL RESULT CALLED TO, READ BACK BY AND VERIFIED WITH: [place] GHWEXHB [phone] FCP    Culture (A)  Final    BACTEROIDES FRAGILIS BETA LACTAMASE POSITIVE Performed at [facility], [address] [place], [place] [address]    Report Status 04/13/2018 FINAL  Final  Blood Culture ID Panel (Reflexed)     Status: None   Collection Time: 04/09/18  7:13 AM  Result Value Ref Range Status   Enterococcus species NOT DETECTED NOT DETECTED Final    Comment: CRITICAL RESULT CALLED TO, READ BACK BY AND VERIFIED WITH: [place] D J[place] [phone] FCP    Listeria monocytogenes NOT DETECTED NOT DETECTED Final   Staphylococcus species NOT DETECTED NOT DETECTED Final   Staphylococcus aureus (BCID) NOT DETECTED NOT DETECTED Final   Streptococcus species NOT DETECTED NOT DETECTED Final   Streptococcus agalactiae NOT DETECTED NOT DETECTED Final   Streptococcus pneumoniae NOT DETECTED NOT DETECTED Final   Streptococcus pyogenes NOT DETECTED NOT DETECTED Final   Acinetobacter baumannii NOT DETECTED NOT DETECTED Final   Enterobacteriaceae species NOT DETECTED NOT DETECTED Final   Enterobacter cloacae complex NOT DETECTED NOT DETECTED Final   Escherichia coli NOT DETECTED NOT DETECTED Final   Klebsiella oxytoca NOT DETECTED NOT DETECTED Final   Klebsiella pneumoniae NOT DETECTED NOT DETECTED Final   Proteus species NOT DETECTED NOT DETECTED Final   Serratia marcescens NOT DETECTED NOT DETECTED Final   Haemophilus influenzae NOT DETECTED NOT DETECTED Final   Neisseria meningitidis NOT DETECTED NOT DETECTED Final   Pseudomonas aeruginosa NOT DETECTED NOT DETECTED Final   Candida albicans NOT DETECTED NOT DETECTED Final   Candida glabrata NOT DETECTED NOT DETECTED Final   Candida krusei NOT DETECTED NOT DETECTED Final   Candida parapsilosis NOT DETECTED NOT DETECTED Final   Candida tropicalis NOT DETECTED NOT DETECTED Final     Comment: Performed at [facility], [place] [address], [place], [place] [address]  Culture, blood (Routine X 2) w Reflex to ID Panel     Status: None (Preliminary result)   Collection Time: 04/13/18  6:48 AM  Result Value Ref Range Status   Specimen Description BLOOD RIGHT ANTECUBITAL  Final   Special Requests   Final    BOTTLES DRAWN AEROBIC AND ANAEROBIC Blood Culture results may not be optimal due to an excessive volume of blood received in culture bottles   Culture   Final    NO GROWTH 2 DAYS Performed at [facility] [address] [place], [place] [address]    Report Status PENDING  Incomplete  Culture, blood (Routine X 2) w Reflex to ID Panel     Status: None (Preliminary result)   Collection Time: 04/13/18  6:52 AM  Result Value Ref Range Status   Specimen Description BLOOD RIGHT HAND  Final   Special Requests   Final    BOTTLES DRAWN AEROBIC AND ANAEROBIC Blood Culture results may not be optimal due to an excessive volume of blood received in culture bottles   Culture   Final    NO GROWTH 2 DAYS Performed at [facility], [place] [address] [place], [place] [address]    Report Status PENDING  Incomplete      Studies: No results found.  Scheduled Meds: . Chlorhexidine Gluconate Cloth  6 each Topical Q0600  . doxercalciferol  2 mcg Intravenous Q Wed-HD  . feeding supplement (NEPRO CARB STEADY)  237 mL Oral BID BM  . feeding supplement (PRO-STAT SUGAR FREE 64)  30 mL Oral BID  . folic acid  1 mg Oral Daily  . heparin injection (subcutaneous)  5,000 Units Subcutaneous Q8H  . ondansetron (ZOFRAN) IV  4 mg Intravenous Once  . pantoprazole  40 mg Oral BID  . senna-docusate  2 tablet Oral BID  . Vitamin D (Ergocalciferol)  50,000 Units Oral Weekly    Continuous Infusions: . sodium chloride Stopped (04/09/18 1208)  . ampicillin-sulbactam (UNASYN) IV 3 g (04/14/18 1418)     LOS: 6 days     Kayleen Memos, MD [facility] Pager [phone]  If  7PM-7AM, please contact night-coverage [email or web address] Password [facility] 04/15/2018, 12:40 PM

## 2018-04-15 NOTE — Progress Notes (Signed)
Progress Note  Patient Name: Mary Chapman Date of Encounter: 04/15/2018  Primary Cardiologist:  Candee Furbish, MD  Subjective   Feeling well today. Ambulated hall with PT without chest pain or shortness of breath. Spent time discussing plans for nuclear stress test, what is involved, what it shows, etc.  Inpatient Medications    Scheduled Meds: . Chlorhexidine Gluconate Cloth  6 each Topical Q0600  . doxercalciferol  2 mcg Intravenous Q Wed-HD  . feeding supplement (NEPRO CARB STEADY)  237 mL Oral BID BM  . feeding supplement (PRO-STAT SUGAR FREE 64)  30 mL Oral BID  . folic acid  1 mg Oral Daily  . heparin injection (subcutaneous)  5,000 Units Subcutaneous Q8H  . ondansetron (ZOFRAN) IV  4 mg Intravenous Once  . pantoprazole  40 mg Oral BID  . senna-docusate  2 tablet Oral BID  . Vitamin D (Ergocalciferol)  50,000 Units Oral Weekly   Continuous Infusions: . sodium chloride Stopped (04/09/18 1208)  . ampicillin-sulbactam (UNASYN) IV 3 g (04/14/18 1418)   PRN Meds: sodium chloride, acetaminophen, oxyCODONE, polyethylene glycol   Vital Signs    Vitals:   04/14/18 0900 04/14/18 1807 04/14/18 2130 04/15/18 0447  BP: 126/77 120/78 129/74 117/69  Pulse: 89 100 95 83  Resp: 18 16 16 16   Temp: 98.4 F (36.9 C) 100.3 F (37.9 C) (!) 100.6 F (38.1 C) 98.8 F (37.1 C)  TempSrc: Oral Oral Oral Oral  SpO2: 97% 98% 96% 95%  Weight:      Height:        Intake/Output Summary (Last 24 hours) at 04/15/2018 0835 Last data filed at 04/15/2018 3532 Gross per 24 hour  Intake 530.2 ml  Output 0 ml  Net 530.2 ml   Filed Weights   04/12/18 2131 04/13/18 1330 04/13/18 1742  Weight: 65.4 kg 63.5 kg 63.6 kg    Telemetry    NSR - Personally Reviewed  ECG    None today - Personally Reviewed  Physical Exam   General: Well developed, well nourished, female appearing in no acute distress. Head: Normocephalic, atraumatic.  Neck: Supple without bruits, JVD not  elevated. Lungs:  Resp regular and unlabored, CTA. Heart: RRR, S1, S2, no S3, S4, or murmur; no rub. Abdomen: Soft, tender R side, non-distended with bowel sounds. No hepatomegaly.  Extremities: No clubbing, cyanosis, no edema. Distal pedal pulses are 2+ bilaterally. Neuro: Alert and oriented X 3. Moves all extremities spontaneously. Psych: Normal affect.  Labs    Hematology Recent Labs  Lab 04/12/18 0508 04/13/18 0641 04/14/18 0525  WBC 25.3* 20.8* 13.8*  RBC 2.88* 2.96* 2.88*  HGB 8.5* 8.6* 8.5*  HCT 26.7* 27.8* 27.4*  MCV 92.7 93.9 95.1  MCH 29.5 29.1 29.5  MCHC 31.8 30.9 31.0  RDW 16.1* 16.3* 16.0*  PLT 290 362 378    Chemistry Recent Labs  Lab 04/09/18 0704  04/11/18 0612 04/12/18 0508 04/13/18 0641  NA 137   < > 136 137 137  K 4.2   < > 3.5 3.3* 3.5  CL 93*   < > 101 102 102  CO2 22   < > 26 26 26   GLUCOSE 135*   < > 101* 84 70  BUN 58*   < > 35* 21 34*  CREATININE 11.75*   < > 6.32* 4.53* 6.18*  CALCIUM 7.5*   < > 7.2* 7.3* 7.9*  PROT 7.2  --   --  5.6* 5.8*  ALBUMIN 2.3*  --  1.6*  1.5* 1.7*  AST 48*  --   --  31 26  ALT 20  --   --  17 17  ALKPHOS 147*  --   --  103 123  BILITOT 0.5  --   --  0.6 0.6  GFRNONAA 3*   < > 6* 9* 6*  GFRAA 3*   < > 7* 10* 7*  ANIONGAP >20*   < > 9 9 9    < > = values in this interval not displayed.     Radiology    Dg Chest 2 View  Result Date: 04/12/2018 CLINICAL DATA:  Cough.  Nausea, vomiting, and diarrhea. EXAM: CHEST - 2 VIEW COMPARISON:  04/09/2018 FINDINGS: Borderline enlargement of the cardiopericardial silhouette, without edema. The lungs appear otherwise clear. No blunting of the costophrenic angles. IMPRESSION: 1. Borderline enlargement of the cardiopericardial silhouette. Otherwise, no significant abnormalities are observed. Electronically Signed   By: Van Clines M.D.   On: 04/12/2018 20:17   US Abdomen Limited Ruq  Result Date: 04/12/2018 CLINICAL DATA:  Initial evaluation for cholangiocarcinoma.  EXAM: ULTRASOUND ABDOMEN LIMITED RIGHT UPPER QUADRANT COMPARISON:  Prior CT from 04/09/2018. FINDINGS: Gallbladder: Not visualized. Common bile duct: Diameter: 4 mm Liver: Liver demonstrates a heterogeneous echotexture. Multiple heterogeneous irregular hypoechoic masses present within the central aspect of the liver, consistent with biopsy proven adenocarcinoma. These measure 3.3 x 2.7 x 3.0 cm, 2.4 x 1.6 x 1.6 cm, and 4.0 x 3.6 x 2.4 cm. Although not well delineated by ultrasound, these are suspected to be contiguous with 1 another. Portal vein is patent on color Doppler imaging with normal direction of blood flow towards the liver. Possible trace free fluid within the adjacent right upper quadrant. Increased echogenicity within the visualized right kidney, suggesting medical renal disease. IMPRESSION: 1. Multifocal intrahepatic mass, consistent with biopsy proven adenocarcinoma. This is suspected be little interval changed relative to recent CT from 3 days prior, better evaluated on previous exam. 2. Main portal vein remains patent with no portal venous invasion by sonography. 3. No biliary dilatation. Electronically Signed   By: Jeannine Boga M.D.   On: 04/12/2018 17:48     Cardiac Studies   ECHO:  08/08/2016 - Left ventricle: The cavity size was normal. Systolic function was   vigorous. The estimated ejection fraction was in the range of 65%   to 70%. Wall motion was normal; there were no regional wall   motion abnormalities. Doppler parameters are consistent with   abnormal left ventricular relaxation (grade 1 diastolic   dysfunction). There was no evidence of elevated ventricular   filling pressure by Doppler parameters. - Aortic valve: There was no regurgitation. - Aortic root: The aortic root was normal in size. - Mitral valve: There was mild regurgitation. - Right ventricle: Systolic function was normal. - Right atrium: The atrium was normal in size. - Tricuspid valve: There was  mild regurgitation. - Pulmonic valve: There was no regurgitation. - Pulmonary arteries: Systolic pressure was within the normal   range. - Inferior vena cava: The vessel was dilated. The respirophasic   diameter changes were blunted (< 50%), consistent with elevated   central venous pressure. - Pericardium, extracardiac: There was no pericardial effusion.  Impressions:  - Consider non-cardiac causes for anasarca and dyspnea.  Patient Profile     72 y.o. female w/ hx  ESRD secondary to focal segmental glomerulosclerosis on hemodialysis M/W/F, hypertension, anemia of chronic disease, diverticulitis, previous tobacco use (quit in 07/2016), LAD & Lmain  atherosclerosis on chest CT, and recent diagnosis of cholangiocarcinoma, who was admitted 11/22 N&V, fever and chills. S/p 2 U PRBCs for anemia. Sepsis felt 2nd worsening liver mass. Cards saw 11/26 for preop eval, partial hepatectomy.  Assessment & Plan    1. Preop eval - I ordered lexiscan myoview test for tomorrow. NPO at midnight, no caffeine - no ischemic sx  2. Sepsis - mgt and ABX per IM  Otherwise, per IM Principal Problem:   Sepsis ([place]) Active Problems:   Anasarca   Tobacco abuse   Essential hypertension   ESRD on dialysis [facility])   Cholangiocarcinoma ([place])  Signed, Buford Dresser , MD 8:35 AM 04/15/2018   TIME SPENT WITH PATIENT: 15 minutes of direct patient care. More than 50% of that time was spent on coordination of care and counseling regarding nuclear stress testing.

## 2018-04-15 NOTE — Progress Notes (Signed)
Mary Chapman Progress Note   Dialysis Orders: MWF 4h400/80062kg 2K/2.5Ca  L AVF Hep 4000units IV initialbolus 2000 units IVmid run -Hectorol 2 mcg IV q week  -Mircera 149mcg IV q 2 weeks (last 11/13) -Auryixa 2 tabs qac  Assessment/Plan: 1. Sepsis- bacteremia w/ Bacteroides fragilis. Currently on Unasyn Repeat [facility] 04/13/18 no growth so far Tmax 100.6 WBC improved down to 13.8 11/27 2. ESRD - MWF - had HD Tues per holiday  Schedule - next HD Friday- schedule after stress test Friday. 3. Hypertension/volume - HD 11/24 Was supposed to run even per note but net UF 1081. Ran 3 hours. HR in 140s-probably D/T chills/fever. BPimproved Tuesday - kept even on HD wt 63.6 4. Anemia CKD/ABLA/Hx of GI bleed. - HGB 8.5after 2 units PRBCs 11/24.S/Pcolonoscopy 03/05/18 showing severe diverticulosis throughout the colon, single ulcer cecum/biopsied.Tsat 11% Ferritin 2373. Fe def in setting of cancer/GIB which is likely the cause of elevated ferritin- getting some Fe with binders - holding Aranesp for now per Dr. Burr Medico..  Pt will likely then require more frequent transfusions. 5. Metabolic bone disease -  ContVDRA/Binder. 6. Nutrition.Ablumin 1.7 Low albumin2/2 liver disease.Has had unintentional weight loss. Intake remains very poor - encouraged supplements- will liberalize diet to regular plus fluid restriction to help encourage intake 7. Cholangiocarcinoma - new dx 02/2018. Abd CT showing sig increase in size of liver mass in 1 mo interval. Followed by Dr. Burr Medico. Plan is to transfer her to [place] Dr. Cloyd Stagers for surgeryAwaiting cardiac clearance .  No other chemo or cancer tx planned for now.  Possibly repeat CT chest to r/o mets before surgery/  Recommends avoiding EPO due to black box warning of stimulating tumor growth in cancer patients. 8. Hx of 20 year tobacco - quit last year   Mary Jacobson, PA-C [place] [phone] 04/15/2018,7:52 AM  LOS:  6 days   Pt seen, examined and agree w A/P as above.  Mary Splinter MD [employer] pager [phone]   04/16/2018, 8:54 AM    Subjective:   Only ate half slice of french toast - discussed plans for stress test and surgery.  Objective Vitals:   04/14/18 0900 04/14/18 1807 04/14/18 2130 04/15/18 0447  BP: 126/77 120/78 129/74 117/69  Pulse: 89 100 95 83  Resp: 18 16 16 16   Temp: 98.4 F (36.9 C) 100.3 F (37.9 C) (!) 100.6 F (38.1 C) 98.8 F (37.1 C)  TempSrc: Oral Oral Oral Oral  SpO2: 97% 98% 96% 95%  Weight:      Height:       Physical Exam General: sitting in chair.  NAD Heart: RRR Lungs: no rlaes Abdomen: RUQ fullness/tenderness soft + BS Extremities: no LE edema Dialysis Access: left upper AVF + bruit   Additional Objective Labs: Basic Metabolic Panel: Recent Labs  Lab 04/11/18 0612 04/12/18 0508 04/13/18 0641  NA 136 137 137  K 3.5 3.3* 3.5  CL 101 102 102  CO2 26 26 26   GLUCOSE 101* 84 70  BUN 35* 21 34*  CREATININE 6.32* 4.53* 6.18*  CALCIUM 7.2* 7.3* 7.9*  PHOS 3.9  --   --    Liver Function Tests: Recent Labs  Lab 04/09/18 0704 04/11/18 0612 04/12/18 0508 04/13/18 0641  AST 48*  --  31 26  ALT 20  --  17 17  ALKPHOS 147*  --  103 123  BILITOT 0.5  --  0.6 0.6  PROT 7.2  --  5.6* 5.8*  ALBUMIN 2.3* 1.6* 1.5* 1.7*   No results for input(s): LIPASE, AMYLASE in the last 168 hours. CBC: Recent Labs  Lab 04/09/18 0704  04/10/18 0954  04/11/18 0612 04/12/18 0508 04/13/18 0641 04/14/18 0525  WBC 11.9*   < > 13.7*  --  13.4* 25.3* 20.8* 13.8*  NEUTROABS 11.2*  --   --   --   --   --   --   --   HGB 8.0*   < > 6.4*   < > 7.6* 8.5* 8.6* 8.5*  HCT 26.5*   < > 20.7*   < > 24.7* 26.7* 27.8* 27.4*  MCV 98.9   < > 95.0  --  96.1 92.7 93.9 95.1  PLT 446*   < > 305  --  306 290 362 378   < > = values in this interval not displayed.   Blood Culture    Component Value Date/Time   SDES BLOOD RIGHT HAND 04/13/2018 0652    SPECREQUEST  04/13/2018 7939    BOTTLES DRAWN AEROBIC AND ANAEROBIC Blood Culture results may not be optimal due to an excessive volume of blood received in culture bottles   CULT  04/13/2018 0652    NO GROWTH 1 DAY Performed at [facility], [place] [address] [place], [place] [address]    REPTSTATUS PENDING 04/13/2018 2330    Cardiac Enzymes: No results for input(s): CKTOTAL, CKMB, CKMBINDEX, TROPONINI in the last 168 hours. CBG: No results for input(s): GLUCAP in the last 168 hours. Iron Studies: No results for input(s): IRON, TIBC, TRANSFERRIN, FERRITIN in the last 72 hours. Lab Results  Component Value Date   INR 1.23 04/10/2018   INR 1.17 02/26/2018   INR 0.97 03/18/2017   Studies/Results: No results found. Medications: . sodium chloride Stopped (04/09/18 1208)  . ampicillin-sulbactam (UNASYN) IV 3 g (04/14/18 1418)   . Chlorhexidine Gluconate Cloth  6 each Topical Q0600  . doxercalciferol  2 mcg Intravenous Q Wed-HD  . feeding supplement (NEPRO CARB STEADY)  237 mL Oral BID BM  . feeding supplement (PRO-STAT SUGAR FREE 64)  30 mL Oral BID  . folic acid  1 mg Oral Daily  . heparin injection (subcutaneous)  5,000 Units Subcutaneous Q8H  . ondansetron (ZOFRAN) IV  4 mg Intravenous Once  . pantoprazole  40 mg Oral BID  . senna-docusate  2 tablet Oral BID  . Vitamin D (Ergocalciferol)  50,000 Units Oral Weekly

## 2018-04-16 ENCOUNTER — Inpatient Hospital Stay (HOSPITAL_COMMUNITY): Payer: Medicare Other

## 2018-04-16 DIAGNOSIS — R079 Chest pain, unspecified: Secondary | ICD-10-CM

## 2018-04-16 DIAGNOSIS — Z01818 Encounter for other preprocedural examination: Secondary | ICD-10-CM

## 2018-04-16 DIAGNOSIS — I10 Essential (primary) hypertension: Secondary | ICD-10-CM

## 2018-04-16 DIAGNOSIS — R059 Cough, unspecified: Secondary | ICD-10-CM

## 2018-04-16 DIAGNOSIS — N189 Chronic kidney disease, unspecified: Secondary | ICD-10-CM

## 2018-04-16 DIAGNOSIS — R05 Cough: Secondary | ICD-10-CM

## 2018-04-16 DIAGNOSIS — F17210 Nicotine dependence, cigarettes, uncomplicated: Secondary | ICD-10-CM

## 2018-04-16 LAB — CBC WITH DIFFERENTIAL/PLATELET
Abs Immature Granulocytes: 0.48 10*3/uL — ABNORMAL HIGH (ref 0.00–0.07)
Basophils Absolute: 0 10*3/uL (ref 0.0–0.1)
Basophils Relative: 0 %
EOS PCT: 1 %
Eosinophils Absolute: 0.1 10*3/uL (ref 0.0–0.5)
HCT: 27.9 % — ABNORMAL LOW (ref 36.0–46.0)
Hemoglobin: 8.3 g/dL — ABNORMAL LOW (ref 12.0–15.0)
Immature Granulocytes: 4 %
Lymphocytes Relative: 10 %
Lymphs Abs: 1.3 10*3/uL (ref 0.7–4.0)
MCH: 28.7 pg (ref 26.0–34.0)
MCHC: 29.7 g/dL — AB (ref 30.0–36.0)
MCV: 96.5 fL (ref 80.0–100.0)
MONO ABS: 1.2 10*3/uL — AB (ref 0.1–1.0)
MONOS PCT: 9 %
Neutro Abs: 10.1 10*3/uL — ABNORMAL HIGH (ref 1.7–7.7)
Neutrophils Relative %: 76 %
Platelets: 412 10*3/uL — ABNORMAL HIGH (ref 150–400)
RBC: 2.89 MIL/uL — ABNORMAL LOW (ref 3.87–5.11)
RDW: 15.8 % — ABNORMAL HIGH (ref 11.5–15.5)
WBC: 13.2 10*3/uL — ABNORMAL HIGH (ref 4.0–10.5)
nRBC: 0 % (ref 0.0–0.2)

## 2018-04-16 LAB — COMPREHENSIVE METABOLIC PANEL
ALT: 12 U/L (ref 0–44)
AST: 17 U/L (ref 15–41)
Albumin: 1.7 g/dL — ABNORMAL LOW (ref 3.5–5.0)
Alkaline Phosphatase: 120 U/L (ref 38–126)
Anion gap: 12 (ref 5–15)
BUN: 35 mg/dL — ABNORMAL HIGH (ref 8–23)
CO2: 23 mmol/L (ref 22–32)
Calcium: 7.7 mg/dL — ABNORMAL LOW (ref 8.9–10.3)
Chloride: 96 mmol/L — ABNORMAL LOW (ref 98–111)
Creatinine, Ser: 6.91 mg/dL — ABNORMAL HIGH (ref 0.44–1.00)
GFR calc Af Amer: 6 mL/min — ABNORMAL LOW (ref >60)
GFR, EST NON AFRICAN AMERICAN: 5 mL/min — AB (ref >60)
Glucose, Bld: 116 mg/dL — ABNORMAL HIGH (ref 70–99)
Potassium: 3.4 mmol/L — ABNORMAL LOW (ref 3.5–5.1)
Sodium: 131 mmol/L — ABNORMAL LOW (ref 135–145)
Total Bilirubin: 0.5 mg/dL (ref 0.3–1.2)
Total Protein: 6 g/dL — ABNORMAL LOW (ref 6.5–8.1)

## 2018-04-16 LAB — MAGNESIUM: Magnesium: 2.1 mg/dL (ref 1.7–2.4)

## 2018-04-16 LAB — NM MYOCAR MULTI W/SPECT W/WALL MOTION / EF
CHL CUP RESTING HR STRESS: 80 {beats}/min
Estimated workload: 1 METS
MPHR: 148 {beats}/min
Peak HR: 104 {beats}/min
Percent HR: 70 %

## 2018-04-16 LAB — PHOSPHORUS: Phosphorus: 3.4 mg/dL (ref 2.5–4.6)

## 2018-04-16 MED ORDER — PENTAFLUOROPROP-TETRAFLUOROETH EX AERO: 1.0000 "application " | INHALATION_SPRAY | CUTANEOUS | Status: DC | PRN

## 2018-04-16 MED ORDER — METOPROLOL TARTRATE 25 MG PO TABS: 12.5000 mg | ORAL_TABLET | Freq: Two times a day (BID) | ORAL | 0 refills | Status: DC

## 2018-04-16 MED ORDER — LIDOCAINE-PRILOCAINE 2.5-2.5 % EX CREA: 1.0000 "application " | TOPICAL_CREAM | CUTANEOUS | Status: DC | PRN

## 2018-04-16 MED ORDER — FOLIC ACID 1 MG PO TABS: 1.0000 mg | ORAL_TABLET | Freq: Every day | ORAL | 0 refills | Status: DC

## 2018-04-16 MED ORDER — SODIUM CHLORIDE 0.9 % IV SOLN: 100.0000 mL | INTRAVENOUS | Status: DC | PRN

## 2018-04-16 MED ORDER — TECHNETIUM TC 99M TETROFOSMIN IV KIT
30.0000 | PACK | Freq: Once | INTRAVENOUS | Status: AC | PRN
  Administered 2018-04-16: 30 via INTRAVENOUS

## 2018-04-16 MED ORDER — ALTEPLASE 2 MG IJ SOLR: 2.0000 mg | Freq: Once | INTRAMUSCULAR | Status: DC | PRN

## 2018-04-16 MED ORDER — HEPARIN SODIUM (PORCINE) 1000 UNIT/ML DIALYSIS: 1000.0000 [IU] | INTRAMUSCULAR | Status: DC | PRN

## 2018-04-16 MED ORDER — METOPROLOL TARTRATE 12.5 MG HALF TABLET
12.5000 mg | ORAL_TABLET | Freq: Two times a day (BID) | ORAL | Status: DC
  Administered 2018-04-16: 12.5 mg via ORAL
  Filled 2018-04-16: qty 1

## 2018-04-16 MED ORDER — TECHNETIUM TC 99M TETROFOSMIN IV KIT
10.0000 | PACK | Freq: Once | INTRAVENOUS | Status: AC | PRN
  Administered 2018-04-16: 10 via INTRAVENOUS

## 2018-04-16 MED ORDER — REGADENOSON 0.4 MG/5ML IV SOLN
0.4000 mg | Freq: Once | INTRAVENOUS | Status: AC
  Administered 2018-04-16: 0.4 mg via INTRAVENOUS
  Filled 2018-04-16: qty 5

## 2018-04-16 MED ORDER — REGADENOSON 0.4 MG/5ML IV SOLN
INTRAVENOUS | Status: AC
  Filled 2018-04-16: qty 5

## 2018-04-16 MED ORDER — LIDOCAINE HCL (PF) 1 % IJ SOLN: 5.0000 mL | INTRAMUSCULAR | Status: DC | PRN

## 2018-04-16 MED ORDER — OXYCODONE HCL 5 MG PO TABS
ORAL_TABLET | ORAL | Status: AC
  Administered 2018-04-16: 5 mg via ORAL
  Filled 2018-04-16: qty 1

## 2018-04-16 MED ORDER — METOPROLOL TARTRATE 50 MG PO TABS: 50.0000 mg | ORAL_TABLET | Freq: Two times a day (BID) | ORAL | Status: DC

## 2018-04-16 MED ORDER — SENNOSIDES-DOCUSATE SODIUM 8.6-50 MG PO TABS: 2.0000 | ORAL_TABLET | Freq: Two times a day (BID) | ORAL | 0 refills | Status: AC

## 2018-04-16 MED ORDER — HEPARIN SODIUM (PORCINE) 1000 UNIT/ML DIALYSIS: 20.0000 [IU]/kg | INTRAMUSCULAR | Status: DC | PRN

## 2018-04-16 NOTE — Discharge Summary (Addendum)
Discharge Summary  Mary Chapman HWE:[id number] DOB: 1945-10-03  PCP: Glendon Axe, MD  Admit date: 04/09/2018 Discharge date: 04/16/2018  Time spent: 35 minutes  Recommendations for Outpatient Follow-up:  1. Transfer to [facility]. 2. Receiving physician Dr.Ben Cloyd Stagers.  Discharge Diagnoses:  Active Hospital Problems   Diagnosis Date Noted  . Sepsis ([place]) 04/09/2018  . Cough   . Cholangiocarcinoma ([place])   . ESRD on dialysis ([place]) 02/23/2018  . Essential hypertension 08/08/2016  . Tobacco abuse   . Anasarca 08/07/2016    Resolved Hospital Problems  No resolved problems to display.    Discharge Condition: Stable  Diet recommendation: Resume previous diet  Vitals:   04/16/18 1428 04/16/18 1458  BP: (!) 149/82 (!) 145/84  Pulse: 85 85  Resp:  18  Temp:  98.4 F (36.9 C)  SpO2:  98%    History of present illness:  72 year old female with past medical history of end-stage renal disease on hemodialysis Monday Wednesday Friday, recent diagnosis of cholangiocarcinoma, started to follow with Dr. Burr Medico, she presents with fever and chills, her work-up significant for bacteroides fragilisbacteremia,likely source would be her cholangiocarcinoma.  Work-up also significant for anemia, no evidence of significant GI bleed, she required 2units PRBC transfusion, suspected 2/2 to anemia of chronic disease ESRD and malignancy. Her hemoglobin remained stable.  Discussed with Dr. Barry Dienes, general surgery, regarding possible surgery at [facility]. Patient will be transferred to tertiary care [facility] after her stress test. This has been arranged by General surgery. Highly appreciated.  Receiving physician is Dr. Dewaine Conger of general surgery at [facility].  04/16/2018: Patient seen and examined at her bedside.  No acute events overnight.  The patient has no new complaints.  She is status post stress test done today.  Awaiting results.  She denies chest pain, palpitations or dyspnea at rest.  Is scheduled  for hemodialysis today at [facility].  Will be transferred to [facility] for possible surgery of her recently diagnosed cholangiocarcinoma.  Receiving physician Dr. Dewaine Conger.  UPDATE: Per cardiology Myoview stress test revealed low risk.  Patient has been cleared by cardiology to undergo her planned surgery on 04/16/2018.  Hospital Course:  Principal Problem:   Sepsis ([place]) Active Problems:   Anasarca   Tobacco abuse   Essential hypertension   ESRD on dialysis ([place])   Cholangiocarcinoma ([facility])   Cough  Sepsis secondary to Bacteroides fragilisbacteremia suspect abdominal source from recently diagnosed cholangiocarcinoma -Sepsis physiology appears to be improving; afebrile -Presented with fever, leukocytosis, elevated lactic acid. -Bacteroides fragilisgrew on blood cultures 04/09/2018 most likely related to abdominal source from her cholangiocarcinoma. -Repeated blood cultures drawn on 04/13/2018 no growth to date -RUQ pain improving with pain medications, on opiates for chronic pain -On Unasyn day #5/10 -May continue IV antibiotics at the discretion of accepting physician at [facility]  Recently diagnosed cholangiocarcinoma  -Follows With Dr. Burr Medico of oncology and Dr Barry Dienes of Gen surgery -Completed stress test today 04/16/2018.  Results revealed low risk.  Patient is cleared to proceed with surgery per cardiology. -Transfer to [facility] arranged by Dr Barry Dienes -Accepting physician Dr. Dewaine Conger  Nausea and vomiting -Suspected related to her tumor -Improving -IV antiemetics as needed   Uncontrolled hypertension -At home she was on Lopressor 50 mg twice daily -Resume Lopressor at 25 mg twice daily and increase dose as indicated  Anemia of chronic disease 2/2 ESRD -No signs of overt GI bleed, but recent endoscopy/colonoscopy significant for gastritis, duodenitis, and cecal ulcer,  overall no evidence of significant GI bleed, she was transfused 2 units PRBC so far with  good response  -Low folic acid, started on supplements -Hemoglobin is stable at 8.3 with baseline hemoglobin of 10 -Status post 2 units PRBC transfusion; hemoglobin 6 days ago 04/10/2018 was 6.4. -Continue to monitor for any sign of overt bleeding -Repeat CBC at dialysis -Defer to nephrology for iron supplement or RBC stimulating supplement  Moderate protein calorie malnutrition -Low albumin, appetite is good, started on Nepro twice daily, continue  End-stage renal disease on hemodialysis Monday Wednesday Friday -Last hemodialysis at [facility] was on 04/16/2018 -Will transfer to [facility] after she completes her dialysis session today 04/16/2018   Code Status:Full code  Consults:Nephrology, general surgery, oncology  Procedures: Lexiscan stress test-results reveal low risk (cleared by cardiology for planned procedure), hemodialysis    Discharge Exam: BP (!) 145/84 (BP Location: Right Arm)   Pulse 85   Temp 98.4 F (36.9 C) (Oral)   Resp 18   Ht 5' 6.5" (1.689 m)   Wt 64.9 kg Comment: stood to scale   SpO2 98%   BMI 22.75 kg/m  . General: 72 y.o. year-old female well developed well nourished in no acute distress.  Alert and oriented x3. . Cardiovascular: Regular rate and rhythm with no rubs or gallops.  No thyromegaly or JVD noted.   Marland Kitchen Respiratory: Clear to auscultation with no wheezes or rales. Good inspiratory effort. . Abdomen: Soft nontender nondistended with normal bowel sounds x4 quadrants. . Musculoskeletal: No lower extremity edema. 2/4 pulses in all 4 extremities. . Skin: No ulcerative lesions noted or rashes, . Psychiatry: Mood is appropriate for condition and setting  Discharge Instructions You were cared for by a hospitalist during your hospital stay. If you have any questions about your discharge medications or the care you received while you were in the hospital after you are discharged, you can call the unit and asked to speak with the  hospitalist on call if the hospitalist that took care of you is not available. Once you are discharged, your primary care physician will handle any further medical issues. Please note that NO REFILLS for any discharge medications will be authorized once you are discharged, as it is imperative that you return to your primary care physician (or establish a relationship with a primary care physician if you do not have one) for your aftercare needs so that they can reassess your need for medications and monitor your lab values.   Allergies as of 04/16/2018   No Known Allergies     Medication List    TAKE these medications   feeding supplement (NEPRO CARB STEADY) Liqd Take 237 mLs by mouth 3 (three) times daily as needed (Supplement).   folic acid 1 MG tablet Commonly known as:  FOLVITE Take 1 tablet (1 mg total) by mouth daily.   metoprolol tartrate 25 MG tablet Commonly known as:  LOPRESSOR Take 0.5 tablets (12.5 mg total) by mouth 2 (two) times daily. What changed:    medication strength  how much to take   multivitamin Tabs tablet Take 1 tablet by mouth at bedtime.   nicotine 21 mg/24hr patch Commonly known as:  NICODERM CQ - dosed in mg/24 hours Place 1 patch (21 mg total) onto the skin daily.   oxyCODONE-acetaminophen 5-325 MG tablet Commonly known as:  PERCOCET/ROXICET Take 1 tablet by mouth every 6 (six) hours as needed for severe pain.   pantoprazole 40 MG tablet Commonly  known as:  PROTONIX Take 1 tablet (40 mg total) by mouth 2 (two) times daily.   polyethylene glycol packet Commonly known as:  MIRALAX / GLYCOLAX Take 17 g by mouth daily.   senna-docusate 8.6-50 MG tablet Commonly known as:  Senokot-S Take 2 tablets by mouth 2 (two) times daily.   Vitamin D (Ergocalciferol) 1.25 MG (50000 UT) Caps capsule Commonly known as:  DRISDOL Take 50,000 Units by mouth once a week.      No Known Allergies    The results of significant diagnostics from this  hospitalization (including imaging, microbiology, ancillary and laboratory) are listed below for reference.    Significant Diagnostic Studies: Dg Chest 2 View  Result Date: 04/12/2018 CLINICAL DATA:  Cough.  Nausea, vomiting, and diarrhea. EXAM: CHEST - 2 VIEW COMPARISON:  04/09/2018 FINDINGS: Borderline enlargement of the cardiopericardial silhouette, without edema. The lungs appear otherwise clear. No blunting of the costophrenic angles. IMPRESSION: 1. Borderline enlargement of the cardiopericardial silhouette. Otherwise, no significant abnormalities are observed. Electronically Signed   By: Van Clines M.D.   On: 04/12/2018 20:17   Ct Chest Wo Contrast  Result Date: 04/02/2018 CLINICAL DATA:  72 year old female with recently diagnosed adenocarcinoma in the liver. Abdominal pain. Follow-up study. EXAM: CT CHEST WITHOUT CONTRAST TECHNIQUE: Multidetector CT imaging of the chest was performed following the standard protocol without IV contrast. COMPARISON:  CT the chest, abdomen and pelvis 08/07/2016. FINDINGS: Cardiovascular: Heart size is normal. There is no significant pericardial fluid, thickening or pericardial calcification. There is aortic atherosclerosis, as well as atherosclerosis of the great vessels of the mediastinum and the coronary arteries, including calcified atherosclerotic plaque in the left main and left anterior descending coronary arteries. Mediastinum/Nodes: No pathologically enlarged mediastinal or hilar lymph nodes. Please note that accurate exclusion of hilar adenopathy is limited on noncontrast CT scans. Esophagus is unremarkable in appearance. No axillary lymphadenopathy. Lungs/Pleura: Right-sided Bochdalek hernia containing only retroperitoneal fat. Diffuse bronchial wall thickening with moderate centrilobular and mild paraseptal emphysema. No acute consolidative airspace disease. No pleural effusions. 4 mm subpleural nodule in the periphery of the right lower lobe (axial  image 106 of series 8). No other larger more suspicious appearing pulmonary nodules or masses are noted. Upper Abdomen: 2 large nonobstructive calculi in the upper pole collecting system of left kidney measuring up to 7 mm. Aortic atherosclerosis. Numerous colonic diverticulae are noted in the region of the splenic flexure. Musculoskeletal: There are no aggressive appearing lytic or blastic lesions noted in the visualized portions of the skeleton. IMPRESSION: 1. 4 mm subpleural nodule in the periphery of the right lower lobe. This is nonspecific, but strongly favored to represent a benign subpleural lymph node. Attention on follow-up studies is recommended to exclude the possibility of metastatic disease. 2. Diffuse bronchial wall thickening with moderate centrilobular and mild paraseptal emphysema; imaging findings suggestive of underlying COPD. 3. Aortic atherosclerosis, in addition to left main and left anterior descending coronary artery disease. Assessment for potential risk factor modification, dietary therapy or pharmacologic therapy may be warranted, if clinically indicated. 4. Nonobstructive calculi in the upper pole collecting system of left kidney measuring up to 7 mm. Aortic Atherosclerosis (ICD10-I70.0) and Emphysema (ICD10-J43.9). Electronically Signed   By: Vinnie Langton M.D.   On: 04/02/2018 11:05   Ct Abdomen Pelvis W Contrast  Result Date: 04/09/2018 CLINICAL DATA:  Abdominal pain and tenderness. Adenocarcinoma of the liver characterized on biopsy 02/26/2018. Concern for primary cholangiocarcinoma. EXAM: CT ABDOMEN AND PELVIS WITH CONTRAST TECHNIQUE: Multidetector  CT imaging of the abdomen and pelvis was performed using the standard protocol following bolus administration of intravenous contrast. CONTRAST:  31mL ISOVUE-300 IOPAMIDOL (ISOVUE-300) INJECTION 61% While interpreting the scan, no IV contrast was identified within the organs or vascular structures. CT tech Barbaraann Barthel) was asked to  evaluate the patient in the emergency department as there is high suspicion of extravasation at injection site. In the interval patient had extravasation of IV saline from the ER interventions. ER staff and MD were advised of concern for IV contrast extravasation and patient was monitored and evaluated per extravasation protocol. COMPARISON:  CT 03/04/2018 FINDINGS: Lower chest: Lung bases are clear. Hepatobiliary: Significant interval increase in size of the RIGHT hepatic lobe infiltrative mass which now extends into the LEFT hepatic lobe measuring 8.0 x 4.7 cm compared to 5.0 x 4.9 cm on 03/04/2018. Coarse calcification centrally and medially within the mass is unchanged. No IV contrast administered. Fullness in the porta hepatis likely represents adenopathy. Difficult to define on noncontrast exam. Pancreas: Pancreas is normal. No ductal dilatation. No pancreatic inflammation. Spleen: Normal spleen Adrenals/urinary tract: Adrenal glands and kidneys are normal. Nonobstructing calculi in the LEFT kidney the ureters and bladder normal. Stomach/Bowel: Stomach, small-bowel appendix and cecum normal. Multiple diverticula of the descending colon. Pericolonic inflammation along the ascending colon is slightly decreased. Extensive diverticulosis through the descending colon additionally. Rectum normal. Vascular/Lymphatic: Abdominal aorta is normal caliber with atherosclerotic calcification. There is no retroperitoneal or periportal lymphadenopathy. No pelvic lymphadenopathy. Reproductive: Uterus and ovaries normal. Other: No peritoneal metastasis Musculoskeletal: No aggressive osseous lesion IMPRESSION: 1. Significant interval increase in the central hepatic mass (biopsy-proven adenocarcinoma) in short 1 month interval with mass approximately doubling in size. 2. Fullness in the porta hepatis likely represents adenopathy. Difficult to define without sufficient IV contrast exam. 3. Some apparent improvement in inflammation  the ascending colon. 4. Extensive diverticulosis atherosclerotic disease. Electronically Signed   By: Suzy Bouchard M.D.   On: 04/09/2018 09:17   Nm Myocar Multi W/spect W/wall Motion / Ef  Result Date: 04/16/2018  There was no ST segment deviation noted during stress.  No T wave inversion was noted during stress.  Defect 1: There is a small defect of mild severity present in the mid anteroseptal location.  This is a low risk study.  The left ventricular ejection fraction is mildly decreased (45-54%).  Nuclear stress EF: 49%.  Low risk nuclear study with a small mid-septal fixed artifact and otherwise normal perfusion. Mildly reduced left ventricular systolic function by QGS - recommend correlation with echo.   Dg Chest Port 1 View  Result Date: 04/09/2018 CLINICAL DATA:  Shortness of breath.  Renal failure. EXAM: PORTABLE CHEST 1 VIEW COMPARISON:  April 01, 2018 chest CT and chest radiograph June 26, 2017 FINDINGS: There is mild interstitial thickening. No edema or consolidation. Heart is borderline enlarged with pulmonary vascularity normal. No adenopathy. No evident bone lesions. IMPRESSION: Borderline cardiac enlargement. Interstitial mildly prominent, likely reflecting chronic inflammatory type change. No frank edema or consolidation evident. Electronically Signed   By: [name] III M.D.   On: 04/09/2018 07:11   US Abdomen Limited Ruq  Result Date: 04/12/2018 CLINICAL DATA:  Initial evaluation for cholangiocarcinoma. EXAM: ULTRASOUND ABDOMEN LIMITED RIGHT UPPER QUADRANT COMPARISON:  Prior CT from 04/09/2018. FINDINGS: Gallbladder: Not visualized. Common bile duct: Diameter: 4 mm Liver: Liver demonstrates a heterogeneous echotexture. Multiple heterogeneous irregular hypoechoic masses present within the central aspect of the liver, consistent with biopsy proven adenocarcinoma. These  measure 3.3 x 2.7 x 3.0 cm, 2.4 x 1.6 x 1.6 cm, and 4.0 x 3.6 x 2.4 cm. Although not well  delineated by ultrasound, these are suspected to be contiguous with 1 another. Portal vein is patent on color Doppler imaging with normal direction of blood flow towards the liver. Possible trace free fluid within the adjacent right upper quadrant. Increased echogenicity within the visualized right kidney, suggesting medical renal disease. IMPRESSION: 1. Multifocal intrahepatic mass, consistent with biopsy proven adenocarcinoma. This is suspected be little interval changed relative to recent CT from 3 days prior, better evaluated on previous exam. 2. Main portal vein remains patent with no portal venous invasion by sonography. 3. No biliary dilatation. Electronically Signed   By: Jeannine Boga M.D.   On: 04/12/2018 17:48    Microbiology: Recent Results (from the past 240 hour(s))  Blood Culture (routine x 2)     Status: Abnormal   Collection Time: 04/09/18  7:05 AM  Result Value Ref Range Status   Specimen Description   Final    BLOOD RIGHT FOREARM Performed at [facility], [address] [place], [place] [address]    Special Requests   Final    BOTTLES DRAWN AEROBIC AND ANAEROBIC Blood Culture adequate volume Performed at [facility], [address] [place], [place] [address]    Culture  Setup Time   Final    GRAM NEGATIVE RODS ANAEROBIC BOTTLE ONLY CRITICAL RESULT CALLED TO, READ BACK BY AND VERIFIED WITH: [place] [phone] FCP    Culture (A)  Final    BACTEROIDES FRAGILIS BETA LACTAMASE POSITIVE Performed at [facility], [place] [address] [place], [place] [address]    Report Status 04/12/2018 FINAL  Final  Blood Culture ID Panel (Reflexed)     Status: None   Collection Time: 04/09/18  7:05 AM  Result Value Ref Range Status   Enterococcus species NOT DETECTED NOT DETECTED Final   Listeria monocytogenes NOT DETECTED NOT DETECTED Final   Staphylococcus species NOT DETECTED NOT DETECTED Final   Staphylococcus aureus (BCID) NOT  DETECTED NOT DETECTED Final   Streptococcus species NOT DETECTED NOT DETECTED Final   Streptococcus agalactiae NOT DETECTED NOT DETECTED Final   Streptococcus pneumoniae NOT DETECTED NOT DETECTED Final   Streptococcus pyogenes NOT DETECTED NOT DETECTED Final   Acinetobacter baumannii NOT DETECTED NOT DETECTED Final   Enterobacteriaceae species NOT DETECTED NOT DETECTED Final   Enterobacter cloacae complex NOT DETECTED NOT DETECTED Final   Escherichia coli NOT DETECTED NOT DETECTED Final   Klebsiella oxytoca NOT DETECTED NOT DETECTED Final   Klebsiella pneumoniae NOT DETECTED NOT DETECTED Final   Proteus species NOT DETECTED NOT DETECTED Final   Serratia marcescens NOT DETECTED NOT DETECTED Final   Haemophilus influenzae NOT DETECTED NOT DETECTED Final   Neisseria meningitidis NOT DETECTED NOT DETECTED Final   Pseudomonas aeruginosa NOT DETECTED NOT DETECTED Final   Candida albicans NOT DETECTED NOT DETECTED Final   Candida glabrata NOT DETECTED NOT DETECTED Final   Candida krusei NOT DETECTED NOT DETECTED Final   Candida parapsilosis NOT DETECTED NOT DETECTED Final   Candida tropicalis NOT DETECTED NOT DETECTED Final    Comment: Performed at [facility], [place] [address] [place], [place] [address]  Blood Culture (routine x 2)     Status: Abnormal   Collection Time: 04/09/18  7:13 AM  Result Value Ref Range Status   Specimen Description   Final  BLOOD RIGHT ARM Performed at [facility], [address] [place], [place] [address]    Special Requests   Final    BOTTLES DRAWN AEROBIC AND ANAEROBIC Blood Culture adequate volume Performed at [facility], [address] [place], [place] [address]    Culture  Setup Time   Final    GRAM NEGATIVE RODS AEROBIC BOTTLE ONLY CRITICAL RESULT CALLED TO, READ BACK BY AND VERIFIED WITH: [place] JSEGBTD [phone] FCP    Culture (A)  Final    BACTEROIDES FRAGILIS BETA LACTAMASE POSITIVE Performed at [facility], [place] [address] [place], [place] [address]    Report Status 04/13/2018 FINAL  Final  Blood Culture ID Panel (Reflexed)     Status: None   Collection Time: 04/09/18  7:13 AM  Result Value Ref Range Status   Enterococcus species NOT DETECTED NOT DETECTED Final    Comment: CRITICAL RESULT CALLED TO, READ BACK BY AND VERIFIED WITH: PHARM D JAMES LEDFORD [phone] FCP    Listeria monocytogenes NOT DETECTED NOT DETECTED Final   Staphylococcus species NOT DETECTED NOT DETECTED Final   Staphylococcus aureus (BCID) NOT DETECTED NOT DETECTED Final   Streptococcus species NOT DETECTED NOT DETECTED Final   Streptococcus agalactiae NOT DETECTED NOT DETECTED Final   Streptococcus pneumoniae NOT DETECTED NOT DETECTED Final   Streptococcus pyogenes NOT DETECTED NOT DETECTED Final   Acinetobacter baumannii NOT DETECTED NOT DETECTED Final   Enterobacteriaceae species NOT DETECTED NOT DETECTED Final   Enterobacter cloacae complex NOT DETECTED NOT DETECTED Final   Escherichia coli NOT DETECTED NOT DETECTED Final   Klebsiella oxytoca NOT DETECTED NOT DETECTED Final   Klebsiella pneumoniae NOT DETECTED NOT DETECTED Final   Proteus species NOT DETECTED NOT DETECTED Final   Serratia marcescens NOT DETECTED NOT DETECTED Final   Haemophilus influenzae NOT DETECTED NOT DETECTED Final   Neisseria meningitidis NOT DETECTED NOT DETECTED Final   Pseudomonas aeruginosa NOT DETECTED NOT DETECTED Final   Candida albicans NOT DETECTED NOT DETECTED Final   Candida glabrata NOT DETECTED NOT DETECTED Final   Candida krusei NOT DETECTED NOT DETECTED Final   Candida parapsilosis NOT DETECTED NOT DETECTED Final   Candida tropicalis NOT DETECTED NOT DETECTED Final    Comment: Performed at [facility], [place] [address], [place], [place] [address]  Culture, blood (Routine X 2) w Reflex to ID Panel     Status: None (Preliminary result)   Collection Time: 04/13/18  6:48 AM  Result Value Ref Range Status    Specimen Description BLOOD RIGHT ANTECUBITAL  Final   Special Requests   Final    BOTTLES DRAWN AEROBIC AND ANAEROBIC Blood Culture results may not be optimal due to an excessive volume of blood received in culture bottles   Culture   Final    NO GROWTH 3 DAYS Performed at [facility], [place] [address], [place], [place] [address]    Report Status PENDING  Incomplete  Culture, blood (Routine X 2) w Reflex to ID Panel     Status: None (Preliminary result)   Collection Time: 04/13/18  6:52 AM  Result Value Ref Range Status   Specimen Description BLOOD RIGHT HAND  Final   Special Requests   Final    BOTTLES DRAWN AEROBIC AND ANAEROBIC Blood Culture results may not be optimal due to an excessive volume of blood received in culture bottles   Culture   Final  NO GROWTH 3 DAYS Performed at [facility], [place] [address], [place], [place] [address]    Report Status PENDING  Incomplete     Labs: Basic Metabolic Panel: Recent Labs  Lab 04/10/18 0608 04/11/18 0612 04/12/18 0508 04/13/18 0641 04/16/18 1247  NA 132* 136 137 137 131*  K 3.2* 3.5 3.3* 3.5 3.4*  CL 96* 101 102 102 96*  CO2 27 26 26 26 23   GLUCOSE 105* 101* 84 70 116*  BUN 17 35* 21 34* 35*  CREATININE 5.19* 6.32* 4.53* 6.18* 6.91*  CALCIUM 6.8* 7.2* 7.3* 7.9* 7.7*  MG  --   --   --   --  2.1  PHOS  --  3.9  --   --  3.4   Liver Function Tests: Recent Labs  Lab 04/11/18 0612 04/12/18 0508 04/13/18 0641 04/16/18 1247  AST  --  31 26 17   ALT  --  17 17 12   ALKPHOS  --  103 123 120  BILITOT  --  0.6 0.6 0.5  PROT  --  5.6* 5.8* 6.0*  ALBUMIN 1.6* 1.5* 1.7* 1.7*   No results for input(s): LIPASE, AMYLASE in the last 168 hours. No results for input(s): AMMONIA in the last 168 hours. CBC: Recent Labs  Lab 04/11/18 0612 04/12/18 0508 04/13/18 0641 04/14/18 0525 04/16/18 1247  WBC 13.4* 25.3* 20.8* 13.8* 13.2*  NEUTROABS  --   --   --   --  10.1*  HGB 7.6* 8.5* 8.6* 8.5* 8.3*  HCT 24.7* 26.7*  27.8* 27.4* 27.9*  MCV 96.1 92.7 93.9 95.1 96.5  PLT 306 290 362 378 412*   Cardiac Enzymes: No results for input(s): CKTOTAL, CKMB, CKMBINDEX, TROPONINI in the last 168 hours. BNP: BNP (last 3 results) No results for input(s): BNP in the last 8760 hours.  ProBNP (last 3 results) No results for input(s): PROBNP in the last 8760 hours.  CBG: No results for input(s): GLUCAP in the last 168 hours.     Signed:  Kayleen Memos, MD [facility] 04/16/2018, 5:23 PM

## 2018-04-16 NOTE — Progress Notes (Addendum)
Progress Note  Patient Name: Mary Chapman Date of Encounter: 04/16/2018  Primary Cardiologist: Candee Furbish, MD   Subjective   Denies any CP or SOB. Some abdominal soreness  Inpatient Medications    Scheduled Meds: . Chlorhexidine Gluconate Cloth  6 each Topical Q0600  . doxercalciferol  2 mcg Intravenous Q Wed-HD  . feeding supplement (NEPRO CARB STEADY)  237 mL Oral BID BM  . feeding supplement (PRO-STAT SUGAR FREE 64)  30 mL Oral BID  . folic acid  1 mg Oral Daily  . heparin injection (subcutaneous)  5,000 Units Subcutaneous Q8H  . ondansetron (ZOFRAN) IV  4 mg Intravenous Once  . pantoprazole  40 mg Oral BID  . regadenoson      . senna-docusate  2 tablet Oral BID  . Vitamin D (Ergocalciferol)  50,000 Units Oral Weekly   Continuous Infusions: . sodium chloride Stopped (04/09/18 1208)  . ampicillin-sulbactam (UNASYN) IV Stopped (04/15/18 1434)   PRN Meds: sodium chloride, acetaminophen, oxyCODONE, polyethylene glycol   Vital Signs    Vitals:   04/16/18 0700 04/16/18 0921 04/16/18 0922 04/16/18 0924  BP: (!) 156/80 (!) 146/60 (!) 146/66 (!) 149/72  Pulse:      Resp:      Temp:      TempSrc:      SpO2:      Weight:      Height:        Intake/Output Summary (Last 24 hours) at 04/16/2018 1030 Last data filed at 04/15/2018 1500 Gross per 24 hour  Intake 467 ml  Output -  Net 467 ml   Filed Weights   04/12/18 2131 04/13/18 1330 04/13/18 1742  Weight: 65.4 kg 63.5 kg 63.6 kg    Telemetry    NSR - Personally Reviewed  ECG    No new EKG - Personally Reviewed  Physical Exam   GEN: No acute distress.   Neck: No JVD Cardiac: RRR, no murmurs, rubs, or gallops.  Respiratory: Clear to auscultation bilaterally. GI: Soft non-distended  MS: No edema; No deformity. Neuro:  Nonfocal  Psych: Normal affect   Labs    Chemistry Recent Labs  Lab 04/11/18 0612 04/12/18 0508 04/13/18 0641  NA 136 137 137  K 3.5 3.3* 3.5  CL 101 102 102  CO2 26 26 26     GLUCOSE 101* 84 70  BUN 35* 21 34*  CREATININE 6.32* 4.53* 6.18*  CALCIUM 7.2* 7.3* 7.9*  PROT  --  5.6* 5.8*  ALBUMIN 1.6* 1.5* 1.7*  AST  --  31 26  ALT  --  17 17  ALKPHOS  --  103 123  BILITOT  --  0.6 0.6  GFRNONAA 6* 9* 6*  GFRAA 7* 10* 7*  ANIONGAP 9 9 9      Hematology Recent Labs  Lab 04/12/18 0508 04/13/18 0641 04/14/18 0525  WBC 25.3* 20.8* 13.8*  RBC 2.88* 2.96* 2.88*  HGB 8.5* 8.6* 8.5*  HCT 26.7* 27.8* 27.4*  MCV 92.7 93.9 95.1  MCH 29.5 29.1 29.5  MCHC 31.8 30.9 31.0  RDW 16.1* 16.3* 16.0*  PLT 290 362 378    Cardiac EnzymesNo results for input(s): TROPONINI in the last 168 hours. No results for input(s): TROPIPOC in the last 168 hours.   BNPNo results for input(s): BNP, PROBNP in the last 168 hours.   DDimer No results for input(s): DDIMER in the last 168 hours.   Radiology    No results found.  Cardiac Studies   Echo 08/08/2016  LV EF: 65% -   70% Study Conclusions  - Left ventricle: The cavity size was normal. Systolic function was   vigorous. The estimated ejection fraction was in the range of 65%   to 70%. Wall motion was normal; there were no regional wall   motion abnormalities. Doppler parameters are consistent with   abnormal left ventricular relaxation (grade 1 diastolic   dysfunction). There was no evidence of elevated ventricular   filling pressure by Doppler parameters. - Aortic valve: There was no regurgitation. - Aortic root: The aortic root was normal in size. - Mitral valve: There was mild regurgitation. - Right ventricle: Systolic function was normal. - Right atrium: The atrium was normal in size. - Tricuspid valve: There was mild regurgitation. - Pulmonic valve: There was no regurgitation. - Pulmonary arteries: Systolic pressure was within the normal   range. - Inferior vena cava: The vessel was dilated. The respirophasic   diameter changes were blunted (< 50%), consistent with elevated   central venous pressure. -  Pericardium, extracardiac: There was no pericardial effusion.  Impressions:  - Consider non-cardiac causes for anasarca and dyspnea.  Patient Profile     72 y.o. female with PMH of ESRD on HD MWF recently diagnosed cholangiocarcinoma. Cardiology consulted for clearance.   Assessment & Plan    1. Preoperative clearance  - plan to transfer to [facility] for surgery after cardiac clearance.   - 1 day lexiscan myoview completed, pending result by [facility] reader  2. Sepsis: felt to be related to cholangiocarcinoma.   3. HTN  4. Anemia of chronic disease  5. ESRD on HD MWF       For questions or updates, please contact [place] Please consult [email or web address] for contact info under        Signed, Almyra Deforest, [place]  04/16/2018, 10:30 AM    Personally seen and examined. Agree with above.  Nuclear stress test has come back.  Overall low risk.  Small mid anteroseptal defect/artifact.  Ejection fraction calculated at 49%, mildly reduced however upon visual inspection appears normal.  Most recent echocardiogram on 08/08/2016 shows EF of 65 to 70%.  Based upon the stress test, she may proceed with surgery, hepatectomy, secondary to her cholangiocarcinoma with low overall cardiac risk.  She is going to be transferred to [place].  Candee Furbish, MD

## 2018-04-16 NOTE — Progress Notes (Addendum)
[facility] Progress Note   Dialysis Orders: MWF 4h400/80062kg 2K/2.5Ca  L AVF Hep 4000units IV initialbolus 2000 units IVmid run -Hectorol 2 mcg IV q week  -Mircera 171mcg IV q 2 weeks (last 11/13) -Auryixa 2 tabs qac  Assessment/Plan: 1. Sepsis- bacteremiaw/ Bacteroides fragilis. Origin felt to be related to biliary malignancy. [facility] 04/13/18 no growth so farTmax 100.2WBC improved down to 13.811/27 - labs to be drawn pre HD 2. ESRD - MWF - had HD Tues per holiday. For HD shortly before transfer to [facility] 3. Hypertension/volume - HD 11/24 Was supposed to run even per note but net UF 1081. Ran 3 hours. HR in 140s-probably D/T chills/fever. BPimprovedTuesday - kept even on HD wt 63.6 - net UF goal today will be 1 L as BP allows 4. Anemia CKD/ABLA/Hx of GI bleed. - HGB 8.5after 2 units PRBCs11/24.S/Pcolonoscopy 03/05/18 showing severe diverticulosis throughout the colon, single ulcer cecum/biopsied.Tsat 11% Ferritin 2373. Fe def in setting of cancer/GIB which is likely the cause of elevated ferritin- getting some Fe with binders - holding Aranesp for now per Dr. Burr Medico.. Ptwill likely then require more frequent transfusions.- repeat CBC pre HD today 5. Metabolic bone disease -  ContVDRA/Binder. 6. Nutrition.Ablumin 1.7Low albumin2/2 liver disease.Has had unintentional weight loss. Intake remains very poor - encouraged supplements- will liberalize diet to regular plus fluid restriction to help encourage intake. 7. Cholangiocarcinoma- new dx 02/2018. Abd CT showing sig increase in size of liver mass in 1 mo interval. Followed by Dr. Burr Medico. Plan is to transfer her to [place] Dr. Cloyd Stagers for surgery after dialysis today. Had stress test this am . Results pending. Dr. Burr Medico ecommends avoiding EPO due to black box warning of stimulating tumor growth in cancer patients. 8.   Hx of 20 year tobacco - quit last year  9.   Disp - for [facility] transfer  today  Myriam Jacobson, PA-C [place] [phone] 04/16/2018,11:16 AM  LOS: 7 days   Pt seen, examined and agree w A/P as above.  [name] MD [employer] pager [phone]   04/16/2018, 2:19 PM    Subjective:   Ate some yesterday - no complaints. Discussed plans to transfer to [facility].  Objective Vitals:   04/16/18 0700 04/16/18 0921 04/16/18 0922 04/16/18 0924  BP: (!) 156/80 (!) 146/60 (!) 146/66 (!) 149/72  Pulse:      Resp:      Temp:      TempSrc:      SpO2:      Weight:      Height:       Physical Exam General: NAD sitting up in bed Heart: RRR Lungs: no rales Abdomen: RUQ fullness tender soft + BS Extremities: no LE edema Dialysis Access:  Left upper AVF + bruit   Additional Objective Labs: Basic Metabolic Panel: Recent Labs  Lab 04/11/18 0612 04/12/18 0508 04/13/18 0641  NA 136 137 137  K 3.5 3.3* 3.5  CL 101 102 102  CO2 26 26 26   GLUCOSE 101* 84 70  BUN 35* 21 34*  CREATININE 6.32* 4.53* 6.18*  CALCIUM 7.2* 7.3* 7.9*  PHOS 3.9  --   --    Liver Function Tests: Recent Labs  Lab 04/11/18 0612 04/12/18 0508 04/13/18 0641  AST  --  31 26  ALT  --  17 17  ALKPHOS  --  103 123  BILITOT  --  0.6 0.6  PROT  --  5.6* 5.8*  ALBUMIN 1.6* 1.5* 1.7*   No results for input(s): LIPASE, AMYLASE in the last 168 hours. CBC: Recent Labs  Lab 04/10/18 0954  04/11/18 0612 04/12/18 0508 04/13/18 0641 04/14/18 0525  WBC 13.7*  --  13.4* 25.3* 20.8* 13.8*  HGB 6.4*   < > 7.6* 8.5* 8.6* 8.5*  HCT 20.7*   < > 24.7* 26.7* 27.8* 27.4*  MCV 95.0  --  96.1 92.7 93.9 95.1  PLT 305  --  306 290 362 378   < > = values in this interval not displayed.   Blood Culture    Component Value Date/Time   SDES BLOOD RIGHT HAND 04/13/2018 0652   SPECREQUEST  04/13/2018 3212    BOTTLES DRAWN AEROBIC AND ANAEROBIC Blood Culture results may not be optimal due to an excessive volume of blood received in culture bottles   CULT   04/13/2018 0652    NO GROWTH 3 DAYS Performed at [facility], [place] [address], [place], [place] [address]    REPTSTATUS PENDING 04/13/2018 0037    Cardiac Enzymes: No results for input(s): CKTOTAL, CKMB, CKMBINDEX, TROPONINI in the last 168 hours. CBG: No results for input(s): GLUCAP in the last 168 hours. Iron Studies: No results for input(s): IRON, TIBC, TRANSFERRIN, FERRITIN in the last 72 hours. Lab Results  Component Value Date   INR 1.23 04/10/2018   INR 1.17 02/26/2018   INR 0.97 03/18/2017   Studies/Results: No results found. Medications: . sodium chloride Stopped (04/09/18 1208)  . ampicillin-sulbactam (UNASYN) IV Stopped (04/15/18 1434)   . Chlorhexidine Gluconate Cloth  6 each Topical Q0600  . doxercalciferol  2 mcg Intravenous Q Wed-HD  . feeding supplement (NEPRO CARB STEADY)  237 mL Oral BID BM  . feeding supplement (PRO-STAT SUGAR FREE 64)  30 mL Oral BID  . folic acid  1 mg Oral Daily  . heparin injection (subcutaneous)  5,000 Units Subcutaneous Q8H  . ondansetron (ZOFRAN) IV  4 mg Intravenous Once  . pantoprazole  40 mg Oral BID  . regadenoson      . senna-docusate  2 tablet Oral BID  . Vitamin D (Ergocalciferol)  50,000 Units Oral Weekly

## 2018-04-16 NOTE — Progress Notes (Signed)
1 day lexiscan myoview completed without complication, pending final result by [facility] reader  Signed, Almyra Deforest PA Pager: [phone]

## 2018-04-16 NOTE — Care Management Important Message (Signed)
Important Message  Patient Details  Name: Mary Chapman MRN: [id number] Date of Birth: June 19, 1945   Medicare Important Message Given:  Yes. CMA tablet currently not working, therefore patient was unable to sign. CMA informed patient of Medicare rights and left IM with patient.     Jerri Glauser 04/16/2018, 11:33 AM

## 2018-04-16 NOTE — Progress Notes (Signed)
Patient was transferred to [facility] was called and given report.

## 2018-04-18 ENCOUNTER — Other Ambulatory Visit: Payer: Self-pay | Admitting: Hematology

## 2018-04-18 LAB — CULTURE, BLOOD (ROUTINE X 2)
CULTURE: NO GROWTH
Culture: NO GROWTH

## 2018-04-21 MED ORDER — HEPARIN SODIUM (PORCINE) 1000 UNIT/ML IJ SOLN
2000.00 | INTRAMUSCULAR | Status: DC
Start: ? — End: 2018-04-21

## 2018-04-21 MED ORDER — HEPARIN SODIUM (PORCINE) 5000 UNIT/ML IJ SOLN
5000.00 | INTRAMUSCULAR | Status: DC
Start: 2018-04-22 — End: 2018-04-21

## 2018-04-21 MED ORDER — PARICALCITOL 5 MCG/ML IV SOLN
5.00 | INTRAVENOUS | Status: DC
Start: 2018-04-23 — End: 2018-04-21

## 2018-04-21 MED ORDER — NALOXONE HCL 0.4 MG/ML IJ SOLN
0.10 | INTRAMUSCULAR | Status: DC
Start: ? — End: 2018-04-21

## 2018-04-21 MED ORDER — GENERIC EXTERNAL MEDICATION
5.00 | Status: DC
Start: ? — End: 2018-04-21

## 2018-04-21 MED ORDER — GENERIC EXTERNAL MEDICATION
3.00 | Status: DC
Start: 2018-04-22 — End: 2018-04-21

## 2018-04-21 MED ORDER — HEPARIN SODIUM (PORCINE) 1000 UNIT/ML IJ SOLN
4000.00 | INTRAMUSCULAR | Status: DC
Start: ? — End: 2018-04-21

## 2018-04-21 MED ORDER — ONDANSETRON 4 MG PO TBDP
4.00 | ORAL_TABLET | ORAL | Status: DC
Start: ? — End: 2018-04-21

## 2018-04-21 MED ORDER — METOPROLOL TARTRATE 50 MG PO TABS
50.00 | ORAL_TABLET | ORAL | Status: DC
Start: 2018-04-22 — End: 2018-04-21

## 2018-04-21 MED ORDER — POLYETHYLENE GLYCOL 3350 17 G PO PACK
17.00 | PACK | ORAL | Status: DC
Start: 2018-04-23 — End: 2018-04-21

## 2018-04-21 MED ORDER — ACETAMINOPHEN 325 MG PO TABS
650.00 | ORAL_TABLET | ORAL | Status: DC
Start: ? — End: 2018-04-21

## 2018-04-22 ENCOUNTER — Ambulatory Visit: Payer: Medicare Other | Admitting: Cardiology

## 2018-04-23 ENCOUNTER — Telehealth: Payer: Self-pay | Admitting: Hematology

## 2018-04-23 ENCOUNTER — Telehealth: Payer: Self-pay

## 2018-04-23 NOTE — Telephone Encounter (Signed)
Scheduled appt per 12/6 sch message - patient is aware of appt date and time

## 2018-04-23 NOTE — Telephone Encounter (Signed)
Birdena Crandall NP with [facility] called to let us know that because of the symptoms patient presented with fever, chills, wt loss, hematosis, cough they did a TB workup.  Chest x-ray was negative, but the Quantiferon was indeterminate.  Per their Infectious Disease department patient should get a IGRA blood test, if negative she is clear, if not she will need a PPD skin test.

## 2018-04-27 ENCOUNTER — Inpatient Hospital Stay: Payer: Medicare Other | Attending: Hematology | Admitting: Hematology

## 2018-04-27 ENCOUNTER — Encounter: Payer: Self-pay | Admitting: Hematology

## 2018-04-27 VITALS — BP 161/90 | HR 71 | Temp 99.2°F | Resp 18 | Ht 66.0 in | Wt 137.1 lb

## 2018-04-27 DIAGNOSIS — Z5111 Encounter for antineoplastic chemotherapy: Secondary | ICD-10-CM | POA: Insufficient documentation

## 2018-04-27 DIAGNOSIS — I120 Hypertensive chronic kidney disease with stage 5 chronic kidney disease or end stage renal disease: Secondary | ICD-10-CM | POA: Diagnosis not present

## 2018-04-27 DIAGNOSIS — Z992 Dependence on renal dialysis: Secondary | ICD-10-CM | POA: Diagnosis not present

## 2018-04-27 DIAGNOSIS — Z79899 Other long term (current) drug therapy: Secondary | ICD-10-CM | POA: Diagnosis not present

## 2018-04-27 DIAGNOSIS — R7881 Bacteremia: Secondary | ICD-10-CM | POA: Insufficient documentation

## 2018-04-27 DIAGNOSIS — C221 Intrahepatic bile duct carcinoma: Secondary | ICD-10-CM | POA: Diagnosis not present

## 2018-04-27 DIAGNOSIS — N186 End stage renal disease: Secondary | ICD-10-CM

## 2018-04-27 DIAGNOSIS — D631 Anemia in chronic kidney disease: Secondary | ICD-10-CM | POA: Diagnosis not present

## 2018-04-27 MED ORDER — LIDOCAINE-PRILOCAINE 2.5-2.5 % EX CREA: TOPICAL_CREAM | CUTANEOUS | 3 refills | Status: DC

## 2018-04-27 MED ORDER — PROCHLORPERAZINE MALEATE 10 MG PO TABS: 10.0000 mg | ORAL_TABLET | Freq: Four times a day (QID) | ORAL | 1 refills | Status: DC | PRN

## 2018-04-27 MED ORDER — ONDANSETRON HCL 8 MG PO TABS: 8.0000 mg | ORAL_TABLET | Freq: Two times a day (BID) | ORAL | 1 refills | Status: DC | PRN

## 2018-04-27 NOTE — Progress Notes (Signed)
[place]   [phone] [phone]   Clinic Follow up Note   Patient Care Team: Glendon Axe, MD as PCP - General (Family Medicine) [name], MD as PCP - Cardiology (Cardiology) Dwana Melena, MD as Attending Physician (Nephrology) Truitt Merle, MD as Consulting Physician (Hematology)  Date of Service:  04/27/2018  CHIEF COMPLAINT: F/u of   SUMMARY OF ONCOLOGIC HISTORY:   Intrahepatic cholangiocarcinoma ([place])   02/23/2018 Imaging    CT AP W Contrast 02/23/18  IMPRESSION: 1. Large multi-cystic lesion in the central aspect of the liver adjacent to the gallbladder fossa, with several smaller satellite lesions. These are all new compared to prior CT the chest, abdomen and pelvis 08/07/2016, concerning for intrahepatic abscesses. 2. Extensive mural thickening and inflammatory changes in the region of the cecum. This is nonspecific, and could reflect either right-sided diverticulitis, focal area of colitis, or potentially even underlying neoplasm. 3. Cholelithiasis. Gallbladder is nearly completely contracted without surrounding inflammatory changes to suggest an acute cholecystitis at this time. 4. Left-sided nephrolithiasis measuring up to 8 mm in the upper pole collecting system of left kidney. No ureteral stones or findings of urinary tract obstruction. 5. Aortic atherosclerosis.    02/27/2018 Initial Biopsy    Diagnosis 02/27/18  Liver, needle/core biopsy, Right Hepatic Lobe - ADENOCARCINOMA. SEE NOTE.    03/05/2018 Procedure    Colonoscopy 03/05/18  IMPRESSION - Preparation of the colon was fair. - Non-thrombosed external hemorrhoids found on digital rectal exam. - There was significant looping of the colon. - Severe diverticulosis in the recto-sigmoid colon, in the sigmoid colon, in the descending colon, in the transverse colon, at the hepatic flexure, in the ascending colon and in the cecum. There was no evidence of  diverticular bleeding. - A single (solitary) ulcer in the cecum - highly concerning for underlying malignancy. Biopsied. Phlegmonous change is possible, though often in setting of complicated diverticulosis/diverticulitis ulceration would not be as common. - Erythematous mucosa in the transverse colon, at the hepatic flexure, in the ascending colon and in the cecum. Biopsied. - Normal mucosa in the rectum, in the recto-sigmoid colon, in the sigmoid colon and in the descending colon. Biopsied. - Non-bleeding non-thrombosed external and internal hemorrhoids. -----Negative for malignancy     03/11/2018 Initial Diagnosis    Intrahepatic cholangiocarcinoma ([place])    04/02/2018 Imaging    CT CAP W contrast 04/02/18  IMPRESSION: 1. 4 mm subpleural nodule in the periphery of the right lower lobe. This is nonspecific, but strongly favored to represent a benign subpleural lymph node. Attention on follow-up studies is recommended to exclude the possibility of metastatic disease. 2. Diffuse bronchial wall thickening with moderate centrilobular and mild paraseptal emphysema; imaging findings suggestive of underlying COPD. 3. Aortic atherosclerosis, in addition to left main and left anterior descending coronary artery disease. Assessment for potential risk factor modification, dietary therapy or pharmacologic therapy may be warranted, if clinically indicated. 4. Nonobstructive calculi in the upper pole collecting system of left kidney measuring up to 7 mm.      Chemotherapy    PENDING Gemcitabine and Cisplatin every 2 weeks        CURRENT THERAPY:  PENDING Gemcitabine and Cisplatin every 2 weeks   INTERVAL HISTORY: Mary Chapman is here for a follow up of Mary Chapman. She presents to the clinic today with her daughter. She notes she recently left [facility] for being hospitalized for bacteriemia. She completed her antibiotics. She denies any fever since release but notes  still having left abdominal pain  (6/10). She does not take medication for this, but plans to start her Percocet. She notes being occasionally nauseous in the morning. She has been able to eat mostly soup. She notes she is able to live alone and take care of herself. She notes her children are able to see her as needed. She denies cough, chest discomfort or LE swelling. Her daughter in interested in support groups and resources for the patient. She notes she smells other scents on her body which is not her baseline odor. She notes her urine output is very small daily and understands she will be on dialysis long term.      REVIEW OF SYSTEMS:   Constitutional: Denies fevers, chills or abnormal weight loss (+) lower appetite (+) slight weight loss  Eyes: Denies blurriness of vision Ears, nose, mouth, throat, and face: Denies mucositis or sore throat Respiratory: Denies cough, dyspnea or wheezes Cardiovascular: Denies palpitation, chest discomfort or lower extremity swelling Gastrointestinal:  Denied heartburn or change in bowel habits (+) left abdominal pain (+) occasional nausea  Skin: Denies abnormal skin rashes Lymphatics: Denies new lymphadenopathy or easy bruising Neurological:Denies numbness, tingling or new weaknesses Behavioral/Psych: Mood is stable, no new changes  All other systems were reviewed with the patient and are negative.  MEDICAL HISTORY:  Past Medical History:  Diagnosis Date  . Diverticulitis   . Focal segmental glomerulosclerosis    ESRD on MWF HD  . Hypertension   . Tobacco abuse     SURGICAL HISTORY: Past Surgical History:  Procedure Laterality Date  . AV FISTULA PLACEMENT Left 03/18/2017   Procedure: Brachiocephalic ARTERIOVENOUS (AV) FISTULA CREATION left arm;  Surgeon: Elam Dutch, MD;  Location: [facility];  Service: Vascular;  Laterality: Left;  . BIOPSY  03/05/2018   Procedure: BIOPSY;  Surgeon: Rush Landmark Telford Nab., MD;  Location: [facility];  Service: Gastroenterology;;   enteroscopy bx /cytology brushing Greig Castilla bx  . COLONOSCOPY WITH PROPOFOL N/A 03/05/2018   Procedure: COLONOSCOPY WITH PROPOFOL;  Surgeon: Rush Landmark Telford Nab., MD;  Location: [place];  Service: Gastroenterology;  Laterality: N/A;  Bx, Spot  . ENTEROSCOPY N/A 03/05/2018   Procedure: ENTEROSCOPY;  Surgeon: Rush Landmark Telford Nab., MD;  Location: [place];  Service: Gastroenterology;  Laterality: N/A;  BX, Brushing, & Spot  . INSERTION OF DIALYSIS CATHETER N/A 03/18/2017   Procedure: INSERTION OF DIALYSIS CATHETER;  Surgeon: Elam Dutch, MD;  Location: [facility];  Service: Vascular;  Laterality: N/A;  . IR GENERIC HISTORICAL  08/15/2016   IR US GUIDE VASC ACCESS RIGHT 08/15/2016 Arne Cleveland, MD [facility]INTER[facility] RAD  . IR GENERIC HISTORICAL  08/15/2016   IR FLUORO GUIDE CV LINE RIGHT 08/15/2016 Arne Cleveland, MD [facility]  . SUBMUCOSAL INJECTION  03/05/2018   Procedure: SUBMUCOSAL INJECTION;  Surgeon: Rush Landmark Telford Nab., MD;  Location: [place];  Service: Gastroenterology;;  spot tatoo  . TUBAL LIGATION      I have reviewed the social history and family history with the patient and they are unchanged from previous note.  ALLERGIES:  has No Known Allergies.  MEDICATIONS:  Current Outpatient Medications  Medication Sig Dispense Refill  . folic acid (FOLVITE) 1 MG tablet Take 1 tablet (1 mg total) by mouth daily. 30 tablet 0  . metoprolol tartrate (LOPRESSOR) 25 MG tablet Take 0.5 tablets (12.5 mg total) by mouth 2 (two) times daily. 30 tablet 0  . multivitamin (RENA-VIT) TABS tablet Take 1 tablet by mouth at bedtime. 30 tablet  3  . Nutritional Supplements (FEEDING SUPPLEMENT, NEPRO CARB STEADY,) LIQD Take 237 mLs by mouth 3 (three) times daily as needed (Supplement). 90 Can 0  . oxyCODONE-acetaminophen (PERCOCET) 5-325 MG tablet Take 1 tablet by mouth every 6 (six) hours as needed for severe pain. 20 tablet 0  . pantoprazole (PROTONIX) 40 MG tablet TAKE 1 TABLET BY MOUTH  TWICE A DAY 60 tablet 1  . polyethylene glycol (MIRALAX) packet Take 17 g by mouth daily. 30 each 0  . senna-docusate (SENOKOT-S) 8.6-50 MG tablet Take 2 tablets by mouth 2 (two) times daily. 30 tablet 0  . Vitamin D, Ergocalciferol, (DRISDOL) 50000 units CAPS capsule Take 50,000 Units by mouth once a week.  5  . nicotine (NICODERM CQ - DOSED IN MG/24 HOURS) 21 mg/24hr patch Place 1 patch (21 mg total) onto the skin daily. (Patient not taking: Reported on 04/12/2018) 28 patch 0   No current facility-administered medications for this visit.     PHYSICAL EXAMINATION: ECOG PERFORMANCE STATUS: 1 - Symptomatic but completely ambulatory  Vitals:   04/27/18 1223  BP: (!) 161/90  Pulse: 71  Resp: 18  Temp: 99.2 F (37.3 C)  SpO2: 99%   Filed Weights   04/27/18 1223  Weight: 137 lb 1.6 oz (62.2 kg)    GENERAL:alert, no distress and comfortable SKIN: skin color, texture, turgor are normal, no rashes or significant lesions EYES: normal, Conjunctiva are pink and non-injected, sclera clear OROPHARYNX:no exudate, no erythema and lips, buccal mucosa, and tongue normal  NECK: supple, thyroid normal size, non-tender, without nodularity LYMPH:  no palpable lymphadenopathy in the cervical, axillary or inguinal LUNGS: clear to auscultation and percussion with normal breathing effort HEART: regular rate & rhythm and no murmurs and no lower extremity edema ABDOMEN:abdomen soft, non-tender and normal bowel sounds Musculoskeletal:no cyanosis of digits and no clubbing  NEURO: alert & oriented x 3 with fluent speech, no focal motor/sensory deficits  LABORATORY DATA:  I have reviewed the data as listed CBC Latest Ref Rng & Units 04/16/2018 04/14/2018 04/13/2018  WBC 4.0 - 10.5 K/uL 13.2(H) 13.8(H) 20.8(H)  Hemoglobin 12.0 - 15.0 g/dL 8.3(L) 8.5(L) 8.6(L)  Hematocrit 36.0 - 46.0 % 27.9(L) 27.4(L) 27.8(L)  Platelets 150 - 400 K/uL 412(H) 378 362     CMP Latest Ref Rng & Units 04/16/2018 04/13/2018  04/12/2018  Glucose 70 - 99 mg/dL 116(H) 70 84  BUN 8 - 23 mg/dL 35(H) 34(H) 21  Creatinine 0.44 - 1.00 mg/dL 6.91(H) 6.18(H) 4.53(H)  Sodium 135 - 145 mmol/L 131(L) 137 137  Potassium 3.5 - 5.1 mmol/L 3.4(L) 3.5 3.3(L)  Chloride 98 - 111 mmol/L 96(L) 102 102  CO2 22 - 32 mmol/L 23 26 26   Calcium 8.9 - 10.3 mg/dL 7.7(L) 7.9(L) 7.3(L)  Total Protein 6.5 - 8.1 g/dL 6.0(L) 5.8(L) 5.6(L)  Total Bilirubin 0.3 - 1.2 mg/dL 0.5 0.6 0.6  Alkaline Phos 38 - 126 U/L 120 123 103  AST 15 - 41 U/L 17 26 31   ALT 0 - 44 U/L 12 17 17       RADIOGRAPHIC STUDIES: I have personally reviewed the radiological images as listed and agreed with the findings in the report. No results found.   Abdominal [facility] without contrast at [facility] April 19, 2018  -- Limited examination given chronic renal disease and resulting noncontrast technique.  -- Infiltrative 9.0 cm mass centered in hepatic segment 5 but with extension into segments 4 and 6, compatible with known cholangiocarcinoma. There is a 1.7 cm satellite  lesion in hepatic segment 6.  -- A large area at the posterior margin of the intrahepatic mass appears cystic/necrotic and superinfection is not excluded given history of bacteremia.  -- Several enlarged, possibly metastatic portal hepatic lymph nodes are present causing extrinsic compression on the portal vein and infrahepatic IVC. Vascular assessment is limited by noncontrast technique.  ASSESSMENT & PLAN:  Mary Chapman is a 72 y.o. female with    1. Adenocarcinoma in liver, likely intrahepatic cholangiocarcinoma, cT2N1M0 stage IIIB, unresectable  -She was diagnosed in 02/2018.  Unfortunately her tumor has doubled in size within months. Dr. Barry Dienes referred her to see surgeon Dr. Alphonsus Sias at [facility] who determined she is not eligible for surgery due to her tumor location and positive lymph nodes.  -I discussed without complete surgical resection her cancer is no longer curable but still treatable. I discussed  treatment options of  chemotherapy with cisplatin and gemcitabine or FOLFOX every 2 weeks. Given she is on dialysis chemo would be difficult to dose, and she will likely have more side effects  -I will do [facility] testing to see if she is eligible for immunotherapy or targeted therapy.  -She also has an option of seeing IR for liver targeted therapy such as bland embolization or Y90. Her liver function so far is adequate. I have discussed with IR Dr. Anselm Pancoast and will refer her to IR for consultation.  -Goal of care is palliative to control her disease and prolong her life.  -I discussed without treatment her pain will worsen and she will likely develop cancer related symptoms.  Her life expectancy is likely less than 6 months. -I recommend start with low dose chemo Gem/Cis and scan her after 4 cycles to monitor response. If not working or not tolerable will proceed with embolization.   --Chemotherapy consent: Side effects including but does not limited to, fatigue, nausea, vomiting, diarrhea, hair loss, neuropathy, fluid retention, renal and kidney dysfunction, neutropenic fever, needed for blood transfusion, bleeding, were discussed with patient in great detail. She agrees to proceed. -Based on the literature, will adjust her dose dur to her ESRD and HD, will dose reduce by 50% and on Tuesdays or Thursdays only (she is on HD MWF) -Given her long term IV treatment and dialysis, I recommend PAC placement. She is interested.  -She will proceed with education chemo class before starting chemo.  -F/u in 2 weeks   2. Recent fragilisbacteremia -maybe related to her cholangiocarcinoma -She has completed course of antibiotics -At high risk for recurrent infection after chemo, will support her chemo with GCSF and prophylactic antibiotics if needed  3. HTN - She is currently on metoprolol - f/u with PCP  4. End Stage Renal Disease, on HD MWF -She is being treated with dialysis, almost for 2 years  now -I will contact her nephrologist to make sure she is not or EPO or Aranesp injections as this can stimulate cancer growth  -f/u with nephrology Dr. Augustin Coupe   5. Abdominal pain  -Pain is currently 6/10. To better control pain I recommend her to take oxycodone as needed   6. Anemia of chronic disease  -related to her end-stage renal disease, and underlying malignancy -Will give blood transfusion as needed, last given on 04/17/18 -Continue to monitor.    7. Support -Her daughter recommended her mother to speak with someone for support.  -I discussed the [facility] resources available to her including chaplin, Education officer, museum, dietician, GI navigator and GI cancer support groups.  8. Goal of care discussion  -We again discussed the incurable nature of her cancer, and the overall poor prognosis, especially if she does not have good response to chemotherapy or progress on chemo -The patient understands the goal of care is palliative. -she is full code now    PLAN -IR port placement next week  -IR referral for liver targeted therapy  -Chemo class, lab and f/u the week of 12/24 -Lab, flush and chemo cisplatin and gemcitabine (4hrs only) on 12/31, she does not need hydration for cisplatin due to ESRD    No problem-specific Assessment & Plan notes found for this encounter.   Orders Placed This Encounter  Procedures  . IR Perc Tun Perit Cath W/Port    Standing Status:   Future    Standing Expiration Date:   06/29/2019    Order Specific Question:   Reason for exam:    Answer:   chemo    Order Specific Question:   Preferred Imaging Location?    Answer:   [facility]  . Ambulatory referral to Interventional Radiology    Referral Priority:   Routine    Referral Type:   Consultation    Referral Reason:   Specialty Services Required    Requested Specialty:   Interventional Radiology    Number of Visits Requested:   1   All questions were answered. The patient knows to call the  clinic with any problems, questions or concerns. No barriers to learning was detected. I spent 30 minutes counseling the patient face to face. The total time spent in the appointment was 40 minutes and more than 50% was on counseling and review of test results     Truitt Merle, MD 04/27/2018   I, Joslyn Devon, am acting as scribe for Truitt Merle, MD.   I have reviewed the above documentation for accuracy and completeness, and I agree with the above.

## 2018-04-27 NOTE — Progress Notes (Signed)
START OFF PATHWAY REGIMEN - [Other Dx]   [id number]:Gemcitabine + Cisplatin every 28 days:   A cycle is every 28 days:     Gemcitabine      Cisplatin   **Always confirm dose/schedule in your pharmacy ordering system**    Patient Characteristics: Intent of Therapy: Non-Curative / Palliative Intent, Discussed with Patient 

## 2018-04-28 ENCOUNTER — Telehealth: Payer: Self-pay

## 2018-04-28 NOTE — Telephone Encounter (Signed)
Faxed office visit note from 05/06/18 to Dr. Otelia Santee at [facility]

## 2018-05-05 ENCOUNTER — Other Ambulatory Visit: Payer: Self-pay | Admitting: Radiology

## 2018-05-06 ENCOUNTER — Encounter (HOSPITAL_COMMUNITY): Payer: Self-pay

## 2018-05-06 ENCOUNTER — Ambulatory Visit (HOSPITAL_COMMUNITY)
Admission: RE | Admit: 2018-05-06 | Discharge: 2018-05-06 | Disposition: A | Discharge status: Home / Self Care | Payer: Medicare Other | Source: Ambulatory Visit | Attending: Hematology | Admitting: Hematology

## 2018-05-06 ENCOUNTER — Other Ambulatory Visit: Payer: Self-pay | Admitting: Hematology

## 2018-05-06 DIAGNOSIS — C221 Intrahepatic bile duct carcinoma: Secondary | ICD-10-CM | POA: Insufficient documentation

## 2018-05-06 DIAGNOSIS — I120 Hypertensive chronic kidney disease with stage 5 chronic kidney disease or end stage renal disease: Secondary | ICD-10-CM | POA: Insufficient documentation

## 2018-05-06 DIAGNOSIS — F17210 Nicotine dependence, cigarettes, uncomplicated: Secondary | ICD-10-CM | POA: Insufficient documentation

## 2018-05-06 DIAGNOSIS — N186 End stage renal disease: Secondary | ICD-10-CM | POA: Diagnosis not present

## 2018-05-06 DIAGNOSIS — Z8249 Family history of ischemic heart disease and other diseases of the circulatory system: Secondary | ICD-10-CM | POA: Insufficient documentation

## 2018-05-06 HISTORY — PX: IR IMAGING GUIDED PORT INSERTION: IMG5740

## 2018-05-06 LAB — PROTIME-INR
INR: 0.96
Prothrombin Time: 12.7 seconds (ref 11.4–15.2)

## 2018-05-06 LAB — CBC WITH DIFFERENTIAL/PLATELET
Abs Immature Granulocytes: 0.08 10*3/uL — ABNORMAL HIGH (ref 0.00–0.07)
Basophils Absolute: 0 10*3/uL (ref 0.0–0.1)
Basophils Relative: 0 %
EOS PCT: 2 %
Eosinophils Absolute: 0.2 10*3/uL (ref 0.0–0.5)
HCT: 25.1 % — ABNORMAL LOW (ref 36.0–46.0)
Hemoglobin: 7.4 g/dL — ABNORMAL LOW (ref 12.0–15.0)
Immature Granulocytes: 1 %
Lymphocytes Relative: 17 %
Lymphs Abs: 1.6 10*3/uL (ref 0.7–4.0)
MCH: 30.3 pg (ref 26.0–34.0)
MCHC: 29.5 g/dL — AB (ref 30.0–36.0)
MCV: 102.9 fL — ABNORMAL HIGH (ref 80.0–100.0)
Monocytes Absolute: 1 10*3/uL (ref 0.1–1.0)
Monocytes Relative: 10 %
Neutro Abs: 6.8 10*3/uL (ref 1.7–7.7)
Neutrophils Relative %: 70 %
Platelets: 478 10*3/uL — ABNORMAL HIGH (ref 150–400)
RBC: 2.44 MIL/uL — ABNORMAL LOW (ref 3.87–5.11)
RDW: 15.5 % (ref 11.5–15.5)
WBC: 9.7 10*3/uL (ref 4.0–10.5)
nRBC: 0 % (ref 0.0–0.2)

## 2018-05-06 LAB — BASIC METABOLIC PANEL
Anion gap: 16 — ABNORMAL HIGH (ref 5–15)
BUN: 17 mg/dL (ref 8–23)
CO2: 29 mmol/L (ref 22–32)
Calcium: 8.2 mg/dL — ABNORMAL LOW (ref 8.9–10.3)
Chloride: 95 mmol/L — ABNORMAL LOW (ref 98–111)
Creatinine, Ser: 4.55 mg/dL — ABNORMAL HIGH (ref 0.44–1.00)
GFR calc Af Amer: 10 mL/min — ABNORMAL LOW (ref >60)
GFR calc non Af Amer: 9 mL/min — ABNORMAL LOW (ref >60)
Glucose, Bld: 88 mg/dL (ref 70–99)
Potassium: 3.3 mmol/L — ABNORMAL LOW (ref 3.5–5.1)
Sodium: 140 mmol/L (ref 135–145)

## 2018-05-06 MED ORDER — LIDOCAINE-EPINEPHRINE (PF) 2 %-1:200000 IJ SOLN
INTRAMUSCULAR | Status: AC
  Filled 2018-05-06: qty 10

## 2018-05-06 MED ORDER — CEFAZOLIN SODIUM-DEXTROSE 2-4 GM/100ML-% IV SOLN
2.0000 g | INTRAVENOUS | Status: AC
  Administered 2018-05-06: 2 g via INTRAVENOUS

## 2018-05-06 MED ORDER — LIDOCAINE-EPINEPHRINE (PF) 2 %-1:200000 IJ SOLN
INTRAMUSCULAR | Status: AC | PRN
  Administered 2018-05-06: 5 mL
  Administered 2018-05-06: 10 mL

## 2018-05-06 MED ORDER — MIDAZOLAM HCL 2 MG/2ML IJ SOLN
INTRAMUSCULAR | Status: AC | PRN
  Administered 2018-05-06 (×2): 1 mg via INTRAVENOUS

## 2018-05-06 MED ORDER — MIDAZOLAM HCL 2 MG/2ML IJ SOLN
INTRAMUSCULAR | Status: AC
  Filled 2018-05-06: qty 4

## 2018-05-06 MED ORDER — CEFAZOLIN SODIUM-DEXTROSE 2-4 GM/100ML-% IV SOLN
INTRAVENOUS | Status: AC
  Administered 2018-05-06: 2 g via INTRAVENOUS
  Filled 2018-05-06: qty 100

## 2018-05-06 MED ORDER — FENTANYL CITRATE (PF) 100 MCG/2ML IJ SOLN
INTRAMUSCULAR | Status: AC | PRN
  Administered 2018-05-06 (×2): 50 ug via INTRAVENOUS

## 2018-05-06 MED ORDER — HEPARIN SOD (PORK) LOCK FLUSH 100 UNIT/ML IV SOLN
INTRAVENOUS | Status: AC | PRN
  Administered 2018-05-06: 500 [IU] via INTRAVENOUS

## 2018-05-06 MED ORDER — HEPARIN SOD (PORK) LOCK FLUSH 100 UNIT/ML IV SOLN
INTRAVENOUS | Status: AC
  Filled 2018-05-06: qty 5

## 2018-05-06 MED ORDER — FENTANYL CITRATE (PF) 100 MCG/2ML IJ SOLN
INTRAMUSCULAR | Status: AC
  Filled 2018-05-06: qty 2

## 2018-05-06 MED ORDER — SODIUM CHLORIDE 0.9 % IV SOLN
INTRAVENOUS | Status: DC
  Administered 2018-05-06: 14:00:00 via INTRAVENOUS

## 2018-05-06 NOTE — H&P (Signed)
Chief Complaint: Patient was seen in consultation today for intrahepatic cholangiocarcinoma.  Referring Physician(s): Feng,Yan  Supervising Physician: Daryll Brod  Patient Status: [facility] - Out-pt  History of Present Illness: Mary Chapman is a 72 y.o. female with a past medical history of hypertension, diverticulitis, intrahepatic cholangiocarcinoma, focal segmental glomerulosclerosis, and tobacco abuse. She was unfortunately diagnosed with intrahepatic cholangiocarcinoma in 02/2018. Her cancer is managed by Dr. Burr Medico. She has tentative plans to begin chemotherapy.  IR requested by Dr. Burr Medico for possible image-guided Port-a-cath insertion so patient can begin chemotherapy. Patient awake and alert sitting in bed with no complaints at this time. Denies fever, chills, chest pain, dyspnea, abdominal pain, N/V, dizziness, or headache.   Past Medical History:  Diagnosis Date  . Diverticulitis   . Focal segmental glomerulosclerosis    ESRD on MWF HD  . Hypertension   . Tobacco abuse     Past Surgical History:  Procedure Laterality Date  . AV FISTULA PLACEMENT Left 03/18/2017   Procedure: Brachiocephalic ARTERIOVENOUS (AV) FISTULA CREATION left arm;  Surgeon: Elam Dutch, MD;  Location: [place];  Service: Vascular;  Laterality: Left;  . BIOPSY  03/05/2018   Procedure: BIOPSY;  Surgeon: Rush Landmark Telford Nab., MD;  Location: [place];  Service: Gastroenterology;;  enteroscopy bx /cytology brushing Greig Castilla bx  . COLONOSCOPY WITH PROPOFOL N/A 03/05/2018   Procedure: COLONOSCOPY WITH PROPOFOL;  Surgeon: Rush Landmark Telford Nab., MD;  Location: [place];  Service: Gastroenterology;  Laterality: N/A;  Bx, Spot  . ENTEROSCOPY N/A 03/05/2018   Procedure: ENTEROSCOPY;  Surgeon: Rush Landmark Telford Nab., MD;  Location: [place];  Service: Gastroenterology;  Laterality: N/A;  BX, Brushing, & Spot  . INSERTION OF DIALYSIS CATHETER N/A 03/18/2017   Procedure: INSERTION OF DIALYSIS  CATHETER;  Surgeon: Elam Dutch, MD;  Location: [place];  Service: Vascular;  Laterality: N/A;  . IR GENERIC HISTORICAL  08/15/2016   IR US GUIDE VASC ACCESS RIGHT 08/15/2016 Arne Cleveland, MD [facility]  . IR GENERIC HISTORICAL  08/15/2016   IR FLUORO GUIDE CV LINE RIGHT 08/15/2016 Arne Cleveland, MD [facility]  . SUBMUCOSAL INJECTION  03/05/2018   Procedure: SUBMUCOSAL INJECTION;  Surgeon: Rush Landmark Telford Nab., MD;  Location: [place];  Service: Gastroenterology;;  spot tatoo  . TUBAL LIGATION      Allergies: Patient has no known allergies.  Medications: Prior to Admission medications   Medication Sig Start Date End Date Taking? Authorizing Provider  folic acid (FOLVITE) 1 MG tablet Take 1 tablet (1 mg total) by mouth daily. 04/16/18   Kayleen Memos, DO  metoprolol tartrate (LOPRESSOR) 25 MG tablet Take 0.5 tablets (12.5 mg total) by mouth 2 (two) times daily. 04/16/18   Kayleen Memos, DO  multivitamin (RENA-VIT) TABS tablet Take 1 tablet by mouth at bedtime. 08/20/16   Rama, Venetia Maxon, MD  nicotine (NICODERM CQ - DOSED IN MG/24 HOURS) 21 mg/24hr patch Place 1 patch (21 mg total) onto the skin daily. Patient not taking: Reported on 04/12/2018 08/20/16   Rama, Venetia Maxon, MD  Nutritional Supplements (FEEDING SUPPLEMENT, NEPRO CARB STEADY,) LIQD Take 237 mLs by mouth 3 (three) times daily as needed (Supplement). 03/06/18   Ghimire, Henreitta Leber, MD  oxyCODONE-acetaminophen (PERCOCET) 5-325 MG tablet Take 1 tablet by mouth every 6 (six) hours as needed for severe pain. 03/06/18 03/06/19  Jonetta Osgood, MD  pantoprazole (PROTONIX) 40 MG tablet TAKE 1 TABLET BY MOUTH TWICE A DAY 04/19/18   Truitt Merle, MD  polyethylene glycol (  MIRALAX) packet Take 17 g by mouth daily. 03/06/18   Ghimire, Henreitta Leber, MD  senna-docusate (SENOKOT-S) 8.6-50 MG tablet Take 2 tablets by mouth 2 (two) times daily. 04/16/18   Kayleen Memos, DO  Vitamin D, Ergocalciferol, (DRISDOL) 50000 units CAPS  capsule Take 50,000 Units by mouth once a week. 01/16/17   [provider]  prochlorperazine (COMPAZINE) 10 MG tablet Take 1 tablet (10 mg total) by mouth every 6 (six) hours as needed (Nausea or vomiting). 04/27/18 04/27/18  Truitt Merle, MD     Family History  Problem Relation Age of Onset  . Hypertension Mother   . Diabetes Mother   . Hypertension Father   . Diabetes Father   . Hypertension Sister   . Hypertension Brother     Social History   Socioeconomic History  . Marital status: Widowed    Spouse name: Not on file  . Number of children: Not on file  . Years of education: Not on file  . Highest education level: Not on file  Occupational History  . Occupation: retired  Scientific laboratory technician  . Financial resource strain: Not on file  . Food insecurity:    Worry: Not on file    Inability: Not on file  . Transportation needs:    Medical: Not on file    Non-medical: Not on file  Tobacco Use  . Smoking status: Current Some Day Smoker    Packs/day: 0.25    Years: 20.00    Pack years: 5.00    Types: Cigarettes  . Smokeless tobacco: Never Used  . Tobacco comment: 3 cigs/day  Substance and Sexual Activity  . Alcohol use: No    Comment: moderate drinker for 10 years, quit in 30 years  . Drug use: No  . Sexual activity: Not Currently    Birth control/protection: Post-menopausal  Lifestyle  . Physical activity:    Days per week: Not on file    Minutes per session: Not on file  . Stress: Not on file  Relationships  . Social connections:    Talks on phone: Not on file    Gets together: Not on file    Attends religious service: Not on file    Active member of club or organization: Not on file    Attends meetings of clubs or organizations: Not on file    Relationship status: Not on file  Other Topics Concern  . Not on file  Social History Narrative  . Not on file     Review of Systems: A 12 point ROS discussed and pertinent positives are indicated in the HPI above.   All other systems are negative.  Review of Systems  Constitutional: Negative for chills and fever.  Respiratory: Negative for shortness of breath and wheezing.   Cardiovascular: Negative for chest pain and palpitations.  Gastrointestinal: Negative for abdominal pain, nausea and vomiting.  Neurological: Negative for dizziness and headaches.  Psychiatric/Behavioral: Negative for behavioral problems and confusion.    Vital Signs: BP (!) 185/92 (BP Location: Right Arm)   Pulse 75   Temp 98.3 F (36.8 C) (Oral)   Resp 18   SpO2 97%   Physical Exam Vitals signs and nursing note reviewed.  Constitutional:      General: She is not in acute distress.    Appearance: Normal appearance.  Cardiovascular:     Rate and Rhythm: Normal rate and regular rhythm.     Heart sounds: Normal heart sounds. No murmur.  Pulmonary:  Effort: Pulmonary effort is normal. No respiratory distress.     Breath sounds: Normal breath sounds. No wheezing.  Skin:    General: Skin is warm and dry.  Neurological:     Mental Status: She is alert and oriented to person, place, and time.  Psychiatric:        Mood and Affect: Mood normal.        Behavior: Behavior normal.        Thought Content: Thought content normal.        Judgment: Judgment normal.      MD Evaluation Airway: WNL Heart: WNL Abdomen: WNL Chest/ Lungs: WNL ASA  Classification: 3 Mallampati/Airway Score: One   Imaging: Dg Chest 2 View  Result Date: 04/12/2018 CLINICAL DATA:  Cough.  Nausea, vomiting, and diarrhea. EXAM: CHEST - 2 VIEW COMPARISON:  04/09/2018 FINDINGS: Borderline enlargement of the cardiopericardial silhouette, without edema. The lungs appear otherwise clear. No blunting of the costophrenic angles. IMPRESSION: 1. Borderline enlargement of the cardiopericardial silhouette. Otherwise, no significant abnormalities are observed. Electronically Signed   By: Van Clines M.D.   On: 04/12/2018 20:17   Ct Abdomen Pelvis  W Contrast  Result Date: 04/09/2018 CLINICAL DATA:  Abdominal pain and tenderness. Adenocarcinoma of the liver characterized on biopsy 02/26/2018. Concern for primary cholangiocarcinoma. EXAM: CT ABDOMEN AND PELVIS WITH CONTRAST TECHNIQUE: Multidetector CT imaging of the abdomen and pelvis was performed using the standard protocol following bolus administration of intravenous contrast. CONTRAST:  46mL ISOVUE-300 IOPAMIDOL (ISOVUE-300) INJECTION 61% While interpreting the scan, no IV contrast was identified within the organs or vascular structures. CT tech Barbaraann Barthel) was asked to evaluate the patient in the emergency department as there is high suspicion of extravasation at injection site. In the interval patient had extravasation of IV saline from the ER interventions. ER staff and MD were advised of concern for IV contrast extravasation and patient was monitored and evaluated per extravasation protocol. COMPARISON:  CT 03/04/2018 FINDINGS: Lower chest: Lung bases are clear. Hepatobiliary: Significant interval increase in size of the RIGHT hepatic lobe infiltrative mass which now extends into the LEFT hepatic lobe measuring 8.0 x 4.7 cm compared to 5.0 x 4.9 cm on 03/04/2018. Coarse calcification centrally and medially within the mass is unchanged. No IV contrast administered. Fullness in the porta hepatis likely represents adenopathy. Difficult to define on noncontrast exam. Pancreas: Pancreas is normal. No ductal dilatation. No pancreatic inflammation. Spleen: Normal spleen Adrenals/urinary tract: Adrenal glands and kidneys are normal. Nonobstructing calculi in the LEFT kidney the ureters and bladder normal. Stomach/Bowel: Stomach, small-bowel appendix and cecum normal. Multiple diverticula of the descending colon. Pericolonic inflammation along the ascending colon is slightly decreased. Extensive diverticulosis through the descending colon additionally. Rectum normal. Vascular/Lymphatic: Abdominal aorta is  normal caliber with atherosclerotic calcification. There is no retroperitoneal or periportal lymphadenopathy. No pelvic lymphadenopathy. Reproductive: Uterus and ovaries normal. Other: No peritoneal metastasis Musculoskeletal: No aggressive osseous lesion IMPRESSION: 1. Significant interval increase in the central hepatic mass (biopsy-proven adenocarcinoma) in short 1 month interval with mass approximately doubling in size. 2. Fullness in the porta hepatis likely represents adenopathy. Difficult to define without sufficient IV contrast exam. 3. Some apparent improvement in inflammation the ascending colon. 4. Extensive diverticulosis atherosclerotic disease. Electronically Signed   By: Suzy Bouchard M.D.   On: 04/09/2018 09:17   Nm Myocar Multi W/spect W/wall Motion / Ef  Result Date: 04/16/2018  There was no ST segment deviation noted during stress.  No T wave inversion  was noted during stress.  Defect 1: There is a small defect of mild severity present in the mid anteroseptal location.  This is a low risk study.  The left ventricular ejection fraction is mildly decreased (45-54%).  Nuclear stress EF: 49%.  Low risk nuclear study with a small mid-septal fixed artifact and otherwise normal perfusion. Mildly reduced left ventricular systolic function by QGS - recommend correlation with echo.   Dg Chest Port 1 View  Result Date: 04/09/2018 CLINICAL DATA:  Shortness of breath.  Renal failure. EXAM: PORTABLE CHEST 1 VIEW COMPARISON:  April 01, 2018 chest CT and chest radiograph June 26, 2017 FINDINGS: There is mild interstitial thickening. No edema or consolidation. Heart is borderline enlarged with pulmonary vascularity normal. No adenopathy. No evident bone lesions. IMPRESSION: Borderline cardiac enlargement. Interstitial mildly prominent, likely reflecting chronic inflammatory type change. No frank edema or consolidation evident. Electronically Signed   By: [name] III M.D.   On:  04/09/2018 07:11   US Abdomen Limited Ruq  Result Date: 04/12/2018 CLINICAL DATA:  Initial evaluation for cholangiocarcinoma. EXAM: ULTRASOUND ABDOMEN LIMITED RIGHT UPPER QUADRANT COMPARISON:  Prior CT from 04/09/2018. FINDINGS: Gallbladder: Not visualized. Common bile duct: Diameter: 4 mm Liver: Liver demonstrates a heterogeneous echotexture. Multiple heterogeneous irregular hypoechoic masses present within the central aspect of the liver, consistent with biopsy proven adenocarcinoma. These measure 3.3 x 2.7 x 3.0 cm, 2.4 x 1.6 x 1.6 cm, and 4.0 x 3.6 x 2.4 cm. Although not well delineated by ultrasound, these are suspected to be contiguous with 1 another. Portal vein is patent on color Doppler imaging with normal direction of blood flow towards the liver. Possible trace free fluid within the adjacent right upper quadrant. Increased echogenicity within the visualized right kidney, suggesting medical renal disease. IMPRESSION: 1. Multifocal intrahepatic mass, consistent with biopsy proven adenocarcinoma. This is suspected be little interval changed relative to recent CT from 3 days prior, better evaluated on previous exam. 2. Main portal vein remains patent with no portal venous invasion by sonography. 3. No biliary dilatation. Electronically Signed   By: Jeannine Boga M.D.   On: 04/12/2018 17:48    Labs:  CBC: Recent Labs    04/12/18 0508 04/13/18 0641 04/14/18 0525 04/16/18 1247  WBC 25.3* 20.8* 13.8* 13.2*  HGB 8.5* 8.6* 8.5* 8.3*  HCT 26.7* 27.8* 27.4* 27.9*  PLT 290 362 378 412*    COAGS: Recent Labs    02/26/18 1907 04/10/18 0608  INR 1.17 1.23    BMP: Recent Labs    04/11/18 0612 04/12/18 0508 04/13/18 0641 04/16/18 1247  NA 136 137 137 131*  K 3.5 3.3* 3.5 3.4*  CL 101 102 102 96*  CO2 26 26 26 23   GLUCOSE 101* 84 70 116*  BUN 35* 21 34* 35*  CALCIUM 7.2* 7.3* 7.9* 7.7*  CREATININE 6.32* 4.53* 6.18* 6.91*  GFRNONAA 6* 9* 6* 5*  GFRAA 7* 10* 7* 6*     LIVER FUNCTION TESTS: Recent Labs    04/09/18 0704 04/11/18 0612 04/12/18 0508 04/13/18 0641 04/16/18 1247  BILITOT 0.5  --  0.6 0.6 0.5  AST 48*  --  31 26 17   ALT 20  --  17 17 12   ALKPHOS 147*  --  103 123 120  PROT 7.2  --  5.6* 5.8* 6.0*  ALBUMIN 2.3* 1.6* 1.5* 1.7* 1.7*    TUMOR MARKERS: No results for input(s): AFPTM, CEA, CA199, CHROMGRNA in the last 8760 hours.  Assessment and Plan:  Intrahepatic cholangiocarcinoma. Plan for image-guided Port-a-cath insertion today with Dr. Annamaria Boots. Patient is NPO. Afebrile. She does not take blood thinners. INR pending.  Risks and benefits of image guided port-a-catheter placement was discussed with the patient including, but not limited to bleeding, infection, pneumothorax, or fibrin sheath development and need for additional procedures. All of the patient's questions were answered, patient is agreeable to proceed. Consent signed and in chart.   Thank you for this interesting consult.  I greatly enjoyed meeting Mary Chapman and look forward to participating in their care.  A copy of this report was sent to the requesting provider on this date.  Electronically Signed: Earley Abide, PA-C 05/06/2018, 1:51 PM   I spent a total of 40 Minutes in face to face in clinical consultation, greater than 50% of which was counseling/coordinating care for intrahepatic cholangiocarcinoma.

## 2018-05-06 NOTE — Procedures (Signed)
cholangioca  S/p RT IJ POWER PORT  Tip svcra No comp Stable EBL 0 Full report in pacs

## 2018-05-06 NOTE — Discharge Instructions (Signed)
Implanted Port Insertion, Care After °This sheet gives you information about how to care for yourself after your procedure. Your health care provider may also give you more specific instructions. If you have problems or questions, contact your health care provider. °What can I expect after the procedure? °After the procedure, it is common to have: °· Discomfort at the port insertion site. °· Bruising on the skin over the port. This should improve over 3-4 days. °Follow these instructions at home: °Port care °· After your port is placed, you will get a manufacturer's information card. The card has information about your port. Keep this card with you at all times. °· Take care of the port as told by your health care provider. Ask your health care provider if you or a family member can get training for taking care of the port at home. A home health care nurse may also take care of the port. °· Make sure to remember what type of port you have. °Incision care ° °  ° °· Follow instructions from your health care provider about how to take care of your port insertion site. Make sure you: °? Wash your hands with soap and water before and after you change your bandage (dressing). If soap and water are not available, use hand sanitizer. °? Change your dressing as told by your health care provider. °? Leave stitches (sutures), skin glue, or adhesive strips in place. These skin closures may need to stay in place for 2 weeks or longer. If adhesive strip edges start to loosen and curl up, you may trim the loose edges. Do not remove adhesive strips completely unless your health care provider tells you to do that. °· Check your port insertion site every day for signs of infection. Check for: °? Redness, swelling, or pain. °? Fluid or blood. °? Warmth. °? Pus or a bad smell. °Activity °· Return to your normal activities as told by your health care provider. Ask your health care provider what activities are safe for you. °· Do not  lift anything that is heavier than 10 lb (4.5 kg), or the limit that you are told, until your health care provider says that it is safe. °General instructions °· Take over-the-counter and prescription medicines only as told by your health care provider. °· Do not take baths, swim, or use a hot tub until your health care provider approves. Ask your health care provider if you may take showers. You may only be allowed to take sponge baths. °· Do not drive for 24 hours if you were given a sedative during your procedure. °· Wear a medical alert bracelet in case of an emergency. This will tell any health care providers that you have a port. °· Keep all follow-up visits as told by your health care provider. This is important. °Contact a health care provider if: °· You cannot flush your port with saline as directed, or you cannot draw blood from the port. °· You have a fever or chills. °· You have redness, swelling, or pain around your port insertion site. °· You have fluid or blood coming from your port insertion site. °· Your port insertion site feels warm to the touch. °· You have pus or a bad smell coming from the port insertion site. °Get help right away if: °· You have chest pain or shortness of breath. °· You have bleeding from your port that you cannot control. °Summary °· Take care of the port as told by your health   care provider. Keep the manufacturer's information card with you at all times. °· Change your dressing as told by your health care provider. °· Contact a health care provider if you have a fever or chills or if you have redness, swelling, or pain around your port insertion site. °· Keep all follow-up visits as told by your health care provider. °This information is not intended to replace advice given to you by your health care provider. Make sure you discuss any questions you have with your health care provider. °Document Released: 02/23/2013 Document Revised: 12/01/2017 Document Reviewed:  12/01/2017 °[employer] Interactive Patient Education © 2019 [employer] °Implanted Port Home Guide °An implanted port is a device that is placed under the skin. It is usually placed in the chest. The device can be used to give IV medicine, to take blood, or for dialysis. You may have an implanted port if: °· You need IV medicine that would be irritating to the small veins in your hands or arms. °· You need IV medicines, such as antibiotics, for a long period of time. °· You need IV nutrition for a long period of time. °· You need dialysis. °Having a port means that your health care provider will not need to use the veins in your arms for these procedures. You may have fewer limitations when using a port than you would if you used other types of long-term IVs, and you will likely be able to return to normal activities after your incision heals. °An implanted port has two main parts: °· Reservoir. The reservoir is the part where a needle is inserted to give medicines or draw blood. The reservoir is round. After it is placed, it appears as a small, raised area under your skin. °· Catheter. The catheter is a thin, flexible tube that connects the reservoir to a vein. Medicine that is inserted into the reservoir goes into the catheter and then into the vein. °How is my port accessed? °To access your port: °· A numbing cream may be placed on the skin over the port site. °· Your health care provider will put on a mask and sterile gloves. °· The skin over your port will be cleaned carefully with a germ-killing soap and allowed to dry. °· Your health care provider will gently pinch the port and insert a needle into it. °· Your health care provider will check for a blood return to make sure the port is in the vein and is not clogged. °· If your port needs to remain accessed to get medicine continuously (constant infusion), your health care provider will place a clear bandage (dressing) over the needle site. The dressing and  needle will need to be changed every week, or as told by your health care provider. °What is flushing? °Flushing helps keep the port from getting clogged. Follow instructions from your health care provider about how and when to flush the port. Ports are usually flushed with saline solution or a medicine called heparin. The need for flushing will depend on how the port is used: °· If the port is only used from time to time to give medicines or draw blood, the port may need to be flushed: °? Before and after medicines have been given. °? Before and after blood has been drawn. °? As part of routine maintenance. Flushing may be recommended every 4-6 weeks. °· If a constant infusion is running, the port may not need to be flushed. °· Throw away any syringes in a disposal   container that is meant for sharp items (sharps container). You can buy a sharps container from a pharmacy, or you can make one by using an empty hard plastic bottle with a cover. °How long will my port stay implanted? °The port can stay in for as long as your health care provider thinks it is needed. When it is time for the port to come out, a surgery will be done to remove it. The surgery will be similar to the procedure that was done to put the port in. °Follow these instructions at home: ° °· Flush your port as told by your health care provider. °· If you need an infusion over several days, follow instructions from your health care provider about how to take care of your port site. Make sure you: °? Wash your hands with soap and water before you change your dressing. If soap and water are not available, use alcohol-based hand sanitizer. °? Change your dressing as told by your health care provider. °? Place any used dressings or infusion bags into a plastic bag. Throw that bag in the trash. °? Keep the dressing that covers the needle clean and dry. Do not get it wet. °? Do not use scissors or sharp objects near the tube. °? Keep the tube clamped,  unless it is being used. °· Check your port site every day for signs of infection. Check for: °? Redness, swelling, or pain. °? Fluid or blood. °? Pus or a bad smell. °· Protect the skin around the port site. °? Avoid wearing bra straps that rub or irritate the site. °? Protect the skin around your port from seat belts. Place a soft pad over your chest if needed. °· Bathe or shower as told by your health care provider. The site may get wet as long as you are not actively receiving an infusion. °· Return to your normal activities as told by your health care provider. Ask your health care provider what activities are safe for you. °· Carry a medical alert card or wear a medical alert bracelet at all times. This will let health care providers know that you have an implanted port in case of an emergency. °Get help right away if: °· You have redness, swelling, or pain at the port site. °· You have fluid or blood coming from your port site. °· You have pus or a bad smell coming from the port site. °· You have a fever. °Summary °· Implanted ports are usually placed in the chest for long-term IV access. °· Follow instructions from your health care provider about flushing the port and changing bandages (dressings). °· Take care of the area around your port by avoiding clothing that puts pressure on the area, and by watching for signs of infection. °· Protect the skin around your port from seat belts. Place a soft pad over your chest if needed. °· Get help right away if you have a fever or you have redness, swelling, pain, drainage, or a bad smell at the port site. °This information is not intended to replace advice given to you by your health care provider. Make sure you discuss any questions you have with your health care provider. °Document Released: 05/05/2005 Document Revised: 06/07/2016 Document Reviewed: 06/07/2016 °[employer] Interactive Patient Education © 2019 [employer] °Moderate Conscious Sedation, Adult, Care  After °These instructions provide you with information about caring for yourself after your procedure. Your health care provider may also give you more specific instructions. Your treatment has   been planned according to current medical practices, but problems sometimes occur. Call your health care provider if you have any problems or questions after your procedure. °What can I expect after the procedure? °After your procedure, it is common: °· To feel sleepy for several hours. °· To feel clumsy and have poor balance for several hours. °· To have poor judgment for several hours. °· To vomit if you eat too soon. °Follow these instructions at home: °For at least 24 hours after the procedure: ° °· Do not: °? Participate in activities where you could fall or become injured. °? Drive. °? Use heavy machinery. °? Drink alcohol. °? Take sleeping pills or medicines that cause drowsiness. °? Make important decisions or sign legal documents. °? Take care of children on your own. °· Rest. °Eating and drinking °· Follow the diet recommended by your health care provider. °· If you vomit: °? Drink water, juice, or soup when you can drink without vomiting. °? Make sure you have little or no nausea before eating solid foods. °General instructions °· Have a responsible adult stay with you until you are awake and alert. °· Take over-the-counter and prescription medicines only as told by your health care provider. °· If you smoke, do not smoke without supervision. °· Keep all follow-up visits as told by your health care provider. This is important. °Contact a health care provider if: °· You keep feeling nauseous or you keep vomiting. °· You feel light-headed. °· You develop a rash. °· You have a fever. °Get help right away if: °· You have trouble breathing. °This information is not intended to replace advice given to you by your health care provider. Make sure you discuss any questions you have with your health care provider. °Document  Released: 02/23/2013 Document Revised: 10/08/2015 Document Reviewed: 08/25/2015 °[employer] Interactive Patient Education © 2019 [employer] ° °

## 2018-05-09 NOTE — Progress Notes (Signed)
Mary Chapman   [phone] [phone]) [phone]   Clinic Follow up Note   Patient Care Team: Glendon Axe, MD as PCP - General (Family Medicine) [name], MD as PCP - Cardiology (Cardiology) Dwana Melena, MD as Attending Physician (Nephrology) Truitt Merle, MD as Consulting Physician (Hematology) 05/10/2018  CHIEF COMPLAINT: Follow up of liver adenocarcinoma  SUMMARY OF ONCOLOGIC HISTORY:   Intrahepatic cholangiocarcinoma ([place])   02/23/2018 Imaging    CT AP W Contrast 02/23/18  IMPRESSION: 1. Large multi-cystic lesion in the central aspect of the liver adjacent to the gallbladder fossa, with several smaller satellite lesions. These are all new compared to prior CT the chest, abdomen and pelvis 08/07/2016, concerning for intrahepatic abscesses. 2. Extensive mural thickening and inflammatory changes in the region of the cecum. This is nonspecific, and could reflect either right-sided diverticulitis, focal area of colitis, or potentially even underlying neoplasm. 3. Cholelithiasis. Gallbladder is nearly completely contracted without surrounding inflammatory changes to suggest an acute cholecystitis at this time. 4. Left-sided nephrolithiasis measuring up to 8 mm in the upper pole collecting system of left kidney. No ureteral stones or findings of urinary tract obstruction. 5. Aortic atherosclerosis.    02/27/2018 Initial Biopsy    Diagnosis 02/27/18  Liver, needle/core biopsy, Right Hepatic Lobe - ADENOCARCINOMA. SEE NOTE.    03/05/2018 Procedure    Colonoscopy 03/05/18  IMPRESSION - Preparation of the colon was fair. - Non-thrombosed external hemorrhoids found on digital rectal exam. - There was significant looping of the colon. - Severe diverticulosis in the recto-sigmoid colon, in the sigmoid colon, in the descending colon, in the transverse colon, at the hepatic flexure, in the ascending colon and in the cecum. There was no evidence of  diverticular bleeding. - A single (solitary) ulcer in the cecum - highly concerning for underlying malignancy. Biopsied. Phlegmonous change is possible, though often in setting of complicated diverticulosis/diverticulitis ulceration would not be as common. - Erythematous mucosa in the transverse colon, at the hepatic flexure, in the ascending colon and in the cecum. Biopsied. - Normal mucosa in the rectum, in the recto-sigmoid colon, in the sigmoid colon and in the descending colon. Biopsied. - Non-bleeding non-thrombosed external and internal hemorrhoids. -----Negative for malignancy     03/11/2018 Initial Diagnosis    Intrahepatic cholangiocarcinoma ([place])    04/02/2018 Imaging    CT CAP W contrast 04/02/18  IMPRESSION: 1. 4 mm subpleural nodule in the periphery of the right lower lobe. This is nonspecific, but strongly favored to represent a benign subpleural lymph node. Attention on follow-up studies is recommended to exclude the possibility of metastatic disease. 2. Diffuse bronchial wall thickening with moderate centrilobular and mild paraseptal emphysema; imaging findings suggestive of underlying COPD. 3. Aortic atherosclerosis, in addition to left main and left anterior descending coronary artery disease. Assessment for potential risk factor modification, dietary therapy or pharmacologic therapy may be warranted, if clinically indicated. 4. Nonobstructive calculi in the upper pole collecting system of left kidney measuring up to 7 mm.      Chemotherapy    PENDING Gemcitabine and Cisplatin every 2 weeks      04/27/2018 Cancer Staging    Staging form: Intrahepatic Bile Duct, AJCC 8th Edition - Clinical: Stage IIIB (cT2, cN1, cM0) - Signed by Truitt Merle, MD on 04/27/2018    05/18/2018 -  Chemotherapy    The patient had palonosetron (ALOXI) injection 0.25 mg, 0.25 mg, Intravenous,  Once, 0 of 4 cycles CISplatin (PLATINOL) 21 mg in  sodium chloride 0.9 % 250 mL chemo  infusion, 12.5 mg/m2 = 21 mg (100 % of original dose 12.5 mg/m2), Intravenous,  Once, 0 of 4 cycles Dose modification: 12.5 mg/m2 (original dose 12.5 mg/m2, Cycle 1, Reason: Provider Judgment, Comment: HD patient ) gemcitabine (GEMZAR) 836 mg in sodium chloride 0.9 % 100 mL chemo infusion, 500 mg/m2 = 836 mg (100 % of original dose 500 mg/m2), Intravenous,  Once, 0 of 4 cycles Dose modification: 500 mg/m2 (original dose 500 mg/m2, Cycle 1, Reason: Provider Judgment, Comment: HD patient ) fosaprepitant (EMEND) 150 mg, dexamethasone (DECADRON) 12 mg in sodium chloride 0.9 % 145 mL IVPB, , Intravenous,  Once, 0 of 4 cycles  for chemotherapy treatment.      CURRENT THERAPY: Gemcitabine and Cisplatin every 2 weeks to start on 05/17/2018, with 50% dose reduction   INTERVAL HISTORY:  Mary Chapman is here for a follow up. She is accompanied by her family today. She has been doing well overall. She is able to complete her daily activities. She goes to dialysis on tomorrow at 6 AM and she has iron transfusion with her dialysis treatment, however, no blood transfusions. She denies any issues with her port.    On review of systems, she reports soreness to her right lateral abdomen. she denies SOB and any other symptoms. Pertinent positives are listed and detailed within the above HPI.    REVIEW OF SYSTEMS:   Constitutional: Denies fevers, chills or abnormal weight loss Eyes: Denies blurriness of vision Ears, nose, mouth, throat, and face: Denies mucositis or sore throat Respiratory: Denies cough, dyspnea or wheezes Cardiovascular: Denies palpitation, chest discomfort or lower extremity swelling Gastrointestinal:  Denies nausea, heartburn or change in bowel habits Skin: Denies abnormal skin rashes Lymphatics: Denies new lymphadenopathy or easy bruising Neurological:Denies numbness, tingling or new weaknesses Behavioral/Psych: Mood is stable, no new changes  All other systems were reviewed with the  patient and are negative.  MEDICAL HISTORY:  Past Medical History:  Diagnosis Date  . Diverticulitis   . Focal segmental glomerulosclerosis    ESRD on MWF HD  . Hypertension   . Tobacco abuse     SURGICAL HISTORY: Past Surgical History:  Procedure Laterality Date  . AV FISTULA PLACEMENT Left 03/18/2017   Procedure: Brachiocephalic ARTERIOVENOUS (AV) FISTULA CREATION left arm;  Surgeon: Elam Dutch, MD;  Location: [place];  Service: Vascular;  Laterality: Left;  . BIOPSY  03/05/2018   Procedure: BIOPSY;  Surgeon: Rush Landmark Telford Nab., MD;  Location: [place];  Service: Gastroenterology;;  enteroscopy bx /cytology brushing Greig Castilla bx  . COLONOSCOPY WITH PROPOFOL N/A 03/05/2018   Procedure: COLONOSCOPY WITH PROPOFOL;  Surgeon: Rush Landmark Telford Nab., MD;  Location: [place];  Service: Gastroenterology;  Laterality: N/A;  Bx, Spot  . ENTEROSCOPY N/A 03/05/2018   Procedure: ENTEROSCOPY;  Surgeon: Rush Landmark Telford Nab., MD;  Location: [place];  Service: Gastroenterology;  Laterality: N/A;  BX, Brushing, & Spot  . INSERTION OF DIALYSIS CATHETER N/A 03/18/2017   Procedure: INSERTION OF DIALYSIS CATHETER;  Surgeon: Elam Dutch, MD;  Location: [place];  Service: Vascular;  Laterality: N/A;  . IR GENERIC HISTORICAL  08/15/2016   IR US GUIDE VASC ACCESS RIGHT 08/15/2016 Arne Cleveland, MD [facility]  . IR GENERIC HISTORICAL  08/15/2016   IR FLUORO GUIDE CV LINE RIGHT 08/15/2016 Arne Cleveland, MD [facility]  . IR IMAGING GUIDED PORT INSERTION  05/06/2018  . SUBMUCOSAL INJECTION  03/05/2018   Procedure: SUBMUCOSAL INJECTION;  Surgeon: Rush Landmark,  Telford Nab., MD;  Location: [facility] ENDOSCOPY;  Service: Gastroenterology;;  spot tatoo  . TUBAL LIGATION      I have reviewed the social history and family history with the patient and they are unchanged from previous note.  ALLERGIES:  has No Known Allergies.  MEDICATIONS:  Current Outpatient Medications  Medication Sig  Dispense Refill  . folic acid (FOLVITE) 1 MG tablet Take 1 tablet (1 mg total) by mouth daily. 30 tablet 0  . metoprolol tartrate (LOPRESSOR) 25 MG tablet Take 0.5 tablets (12.5 mg total) by mouth 2 (two) times daily. 30 tablet 0  . multivitamin (RENA-VIT) TABS tablet Take 1 tablet by mouth at bedtime. 30 tablet 3  . nicotine (NICODERM CQ - DOSED IN MG/24 HOURS) 21 mg/24hr patch Place 1 patch (21 mg total) onto the skin daily. 28 patch 0  . Nutritional Supplements (FEEDING SUPPLEMENT, NEPRO CARB STEADY,) LIQD Take 237 mLs by mouth 3 (three) times daily as needed (Supplement). 90 Can 0  . oxyCODONE-acetaminophen (PERCOCET) 5-325 MG tablet Take 1 tablet by mouth every 6 (six) hours as needed for severe pain. 20 tablet 0  . pantoprazole (PROTONIX) 40 MG tablet TAKE 1 TABLET BY MOUTH TWICE A DAY 60 tablet 1  . polyethylene glycol (MIRALAX) packet Take 17 g by mouth daily. 30 each 0  . senna-docusate (SENOKOT-S) 8.6-50 MG tablet Take 2 tablets by mouth 2 (two) times daily. 30 tablet 0  . Vitamin D, Ergocalciferol, (DRISDOL) 50000 units CAPS capsule Take 50,000 Units by mouth once a week.  5  . lidocaine-prilocaine (EMLA) cream Apply to affected area once 30 g 3  . ondansetron (ZOFRAN) 8 MG tablet Take 1 tablet (8 mg total) by mouth 2 (two) times daily as needed. Start on the third day after chemotherapy. 30 tablet 1  . prochlorperazine (COMPAZINE) 10 MG tablet Take 1 tablet (10 mg total) by mouth every 6 (six) hours as needed (Nausea or vomiting). 30 tablet 1   No current facility-administered medications for this visit.     PHYSICAL EXAMINATION: ECOG PERFORMANCE STATUS: 2 - Symptomatic, <50% confined to bed  Vitals:   05/10/18 1011 05/10/18 1017  BP: (!) 184/94 (!) 187/91  Pulse: 80   Resp: 20   Temp: 98.3 F (36.8 C)   SpO2: 100%    Filed Weights   05/10/18 1011  Weight: 137 lb (62.1 kg)    GENERAL:alert, no distress and comfortable SKIN: skin color, texture, turgor are normal, no  rashes or significant lesions EYES: normal, Conjunctiva are pink and non-injected, sclera clear OROPHARYNX:no exudate, no erythema and lips, buccal mucosa, and tongue normal  NECK: supple, thyroid normal size, non-tender, without nodularity LYMPH:  no palpable lymphadenopathy in the cervical, axillary or inguinal LUNGS: clear to auscultation and percussion with normal breathing effort HEART: regular rate & rhythm and no murmurs and no lower extremity edema ABDOMEN:abdomen soft, non-tender and normal bowel sounds Musculoskeletal:no cyanosis of digits and no clubbing  NEURO: alert & oriented x 3 with fluent speech, no focal motor/sensory deficits  LABORATORY DATA:  I have reviewed the data as listed CBC Latest Ref Rng & Units 05/10/2018 05/06/2018 04/16/2018  WBC 4.0 - 10.5 K/uL 8.8 9.7 13.2(H)  Hemoglobin 12.0 - 15.0 g/dL 6.7(LL) 7.4(L) 8.3(L)  Hematocrit 36.0 - 46.0 % 22.3(L) 25.1(L) 27.9(L)  Platelets 150 - 400 K/uL 404(H) 478(H) 412(H)     CMP Latest Ref Rng & Units 05/10/2018 05/06/2018 04/16/2018  Glucose 70 - 99 mg/dL 80 88 116(H)  BUN 8 - 23 mg/dL 15 17 35(H)  Creatinine 0.44 - 1.00 mg/dL 4.36(HH) 4.55(H) 6.91(H)  Sodium 135 - 145 mmol/L 143 140 131(L)  Potassium 3.5 - 5.1 mmol/L 3.3(L) 3.3(L) 3.4(L)  Chloride 98 - 111 mmol/L 102 95(L) 96(L)  CO2 22 - 32 mmol/L 30 29 23   Calcium 8.9 - 10.3 mg/dL 8.4(L) 8.2(L) 7.7(L)  Total Protein 6.5 - 8.1 g/dL 6.8 - 6.0(L)  Total Bilirubin 0.3 - 1.2 mg/dL <0.2(L) - 0.5  Alkaline Phos 38 - 126 U/L 123 - 120  AST 15 - 41 U/L 10(L) - 17  ALT 0 - 44 U/L <6 - 12      RADIOGRAPHIC STUDIES: I have personally reviewed the radiological images as listed and agreed with the findings in the report. No results found.   ASSESSMENT & PLAN:  No problem-specific Assessment & Plan notes found for this encounter. Mary Chapman is a 72 y.o. female with    1. Adenocarcinomainliver, likelyintrahepatic cholangiocarcinoma, cT2N1M0 stage IIIB,  unresectable  -She was diagnosed in 02/2018.  Unfortunately her tumor has doubled in size in a month. Dr. Barry Dienes referred her to see surgeon Dr. Alphonsus Sias at [facility] who determined she is not eligible for surgery due to her tumor location and positive lymph nodes.  -I recommended palliative chemotherapy with cisplatin and gemcitabine, due to her renal failure and dialysis, will plan to do every 2 weeks, with 50% dose reduction. -Goal of care is palliative to control her disease and prolong her life.              --Chemotherapy consent: Side effects including but does not limited to, fatigue, nausea, vomiting, diarrhea, hair loss, neuropathy, fluid retention, renal and kidney dysfunction, neutropenic fever, needed for blood transfusion, bleeding, were discussed with patient in great detail. She agrees to proceed. -The goal of therapy is palliative -She has had placed by IR -first cycle chemo on 05/17/2018 every 2 weeks  -I have called in antiemetics and emla cream  -I will see her back before the second cycle chemo  -will use Onpro as needed in future, if she develops significant neutropenia  -Labs reviewed, due to her severe anemia, I will arrange blood transfusion this week   2. Recent fragilisbacteremia -maybe related to her cholangiocarcinoma -She has completed course of antibiotics -At high risk for recurrent infection after chemo, will support her chemo with GCSF and prophylactic antibiotics if needed   3. HTN - She is currently on metoprolol - f/u with PCP  4. End Stage Renal Disease, on HD MWF -She is being treated with dialysis, almost for 2 years now -I will contact her nephrologist to make sure she is not or EPO or Aranesp injections as this can stimulate cancer growth  -f/u with nephrology Dr. Augustin Coupe   5. Abdominal pain  -Pain is currently noted as a "soreness". To better control pain I previously recommended her to take oxycodone as needed  6. Anemia of chronic disease    -related to her end-stage renal disease, and underlying malignancy -Will give blood transfusion as needed, last given on 04/17/18  -Labs today, 05/10/2018 show Hgb at 6.7, RBC at 2.20, Platelet at 404k. Ferritin at 1,483. Iron studies show Iron at 33, TIBC at 163, Sat ratio at 20, and UIBC WNL. CMP showed Creatinine at 4.36.  -Labs reviewed with patient and her family today -Discussed with the patient and her family today that due to the low critical hemoglobin that she will need to proceed with  a blood transfusion this week.  -Patient receives dialysis treatment on tomorrow, 05/11/2018 with iron transfusion as well. -My nurse has called her dialysis center, they do not do blood transfusion.  I will arrange blood transfusion 1 unit later this week.   7. Support -Her daughter recommended her mother to speak with someone for support.  -I previously discussed the [facility] resources available to her including chaplin, Education officer, museum, dietician, GI navigator and GI cancer support groups.   8. Goal of care discussion  -We again discussed the incurable nature of her cancer, and the overall poor prognosis, especially if she does not have good response to chemotherapy or progress on chemo -The patient understands the goal of care is palliative. -she is full code now    PLAN -1u PRBC on 12/25 -first cycle chemo cisplatin and gemcitabine on 12/31 -F/u on 1/14 before cycle 2 chemo    Orders Placed This Encounter  Procedures  . Practitioner attestation of consent    I, the ordering practitioner, attest that I have discussed with the patient the benefits, risks, side effects, alternatives, likelihood of achieving goals and potential problems during recovery for the procedure listed.    Standing Status:   Future    Standing Expiration Date:   05/10/2019    Order Specific Question:   Procedure    Answer:   Blood Product(s)  . Care order/instruction    Transfuse Parameters    Standing Status:    Future    Standing Expiration Date:   05/10/2019  . Complete patient signature process for consent form    Standing Status:   Future    Standing Expiration Date:   05/10/2019  . Prepare RBC    Standing Status:   Standing    Number of Occurrences:   1    Order Specific Question:   # of Units    Answer:   1 unit    Order Specific Question:   Transfusion Indications    Answer:   Symptomatic Anemia    Order Specific Question:   If emergent release call blood bank    Answer:   Not emergent release   All questions were answered. The patient knows to call the clinic with any problems, questions or concerns. No barriers to learning was detected. I spent 20 minutes counseling the patient face to face. The total time spent in the appointment was 25 minutes and more than 50% was on counseling and review of test results     Truitt Merle, MD 05/10/2018     I, Soijett Blue am acting as scribe for Dr. Truitt Merle.  I have reviewed the above documentation for accuracy and completeness, and I agree with the above.

## 2018-05-10 ENCOUNTER — Other Ambulatory Visit: Payer: Self-pay

## 2018-05-10 ENCOUNTER — Inpatient Hospital Stay (HOSPITAL_BASED_OUTPATIENT_CLINIC_OR_DEPARTMENT_OTHER): Payer: Medicare Other | Admitting: Hematology

## 2018-05-10 ENCOUNTER — Inpatient Hospital Stay: Payer: Medicare Other

## 2018-05-10 ENCOUNTER — Telehealth: Payer: Self-pay

## 2018-05-10 ENCOUNTER — Encounter: Payer: Self-pay | Admitting: Hematology

## 2018-05-10 VITALS — BP 187/91 | HR 80 | Temp 98.3°F | Resp 20 | Ht 66.0 in | Wt 137.0 lb

## 2018-05-10 DIAGNOSIS — I120 Hypertensive chronic kidney disease with stage 5 chronic kidney disease or end stage renal disease: Secondary | ICD-10-CM

## 2018-05-10 DIAGNOSIS — C221 Intrahepatic bile duct carcinoma: Secondary | ICD-10-CM | POA: Diagnosis not present

## 2018-05-10 DIAGNOSIS — K6389 Other specified diseases of intestine: Secondary | ICD-10-CM

## 2018-05-10 DIAGNOSIS — Z5111 Encounter for antineoplastic chemotherapy: Secondary | ICD-10-CM | POA: Diagnosis not present

## 2018-05-10 DIAGNOSIS — D631 Anemia in chronic kidney disease: Secondary | ICD-10-CM | POA: Diagnosis not present

## 2018-05-10 DIAGNOSIS — N186 End stage renal disease: Secondary | ICD-10-CM

## 2018-05-10 DIAGNOSIS — R7881 Bacteremia: Secondary | ICD-10-CM

## 2018-05-10 DIAGNOSIS — Z992 Dependence on renal dialysis: Secondary | ICD-10-CM

## 2018-05-10 DIAGNOSIS — Z79899 Other long term (current) drug therapy: Secondary | ICD-10-CM

## 2018-05-10 DIAGNOSIS — D490 Neoplasm of unspecified behavior of digestive system: Secondary | ICD-10-CM

## 2018-05-10 LAB — CBC WITH DIFFERENTIAL (CANCER CENTER ONLY)
ABS IMMATURE GRANULOCYTES: 0.07 10*3/uL (ref 0.00–0.07)
Basophils Absolute: 0 10*3/uL (ref 0.0–0.1)
Basophils Relative: 0 %
Eosinophils Absolute: 0.2 10*3/uL (ref 0.0–0.5)
Eosinophils Relative: 3 %
HCT: 22.3 % — ABNORMAL LOW (ref 36.0–46.0)
Hemoglobin: 6.7 g/dL — CL (ref 12.0–15.0)
Immature Granulocytes: 1 %
Lymphocytes Relative: 16 %
Lymphs Abs: 1.4 10*3/uL (ref 0.7–4.0)
MCH: 30.5 pg (ref 26.0–34.0)
MCHC: 30 g/dL (ref 30.0–36.0)
MCV: 101.4 fL — ABNORMAL HIGH (ref 80.0–100.0)
MONO ABS: 0.9 10*3/uL (ref 0.1–1.0)
Monocytes Relative: 10 %
Neutro Abs: 6.2 10*3/uL (ref 1.7–7.7)
Neutrophils Relative %: 70 %
Platelet Count: 404 10*3/uL — ABNORMAL HIGH (ref 150–400)
RBC: 2.2 MIL/uL — ABNORMAL LOW (ref 3.87–5.11)
RDW: 15.5 % (ref 11.5–15.5)
WBC Count: 8.8 10*3/uL (ref 4.0–10.5)
nRBC: 0 % (ref 0.0–0.2)

## 2018-05-10 LAB — CMP (CANCER CENTER ONLY)
AST: 10 U/L — ABNORMAL LOW (ref 15–41)
Albumin: 1.9 g/dL — ABNORMAL LOW (ref 3.5–5.0)
Alkaline Phosphatase: 123 U/L (ref 38–126)
Anion gap: 11 (ref 5–15)
BUN: 15 mg/dL (ref 8–23)
CO2: 30 mmol/L (ref 22–32)
Calcium: 8.4 mg/dL — ABNORMAL LOW (ref 8.9–10.3)
Chloride: 102 mmol/L (ref 98–111)
Creatinine: 4.36 mg/dL (ref 0.44–1.00)
GFR, Est AFR Am: 11 mL/min — ABNORMAL LOW (ref >60)
GFR, Estimated: 9 mL/min — ABNORMAL LOW (ref >60)
Glucose, Bld: 80 mg/dL (ref 70–99)
Potassium: 3.3 mmol/L — ABNORMAL LOW (ref 3.5–5.1)
Sodium: 143 mmol/L (ref 135–145)
TOTAL PROTEIN: 6.8 g/dL (ref 6.5–8.1)
Total Bilirubin: <0.2 mg/dL — ABNORMAL LOW (ref 0.3–1.2)

## 2018-05-10 LAB — IRON AND TIBC
Iron: 33 ug/dL — ABNORMAL LOW (ref 41–142)
Saturation Ratios: 20 % — ABNORMAL LOW (ref 21–57)
TIBC: 163 ug/dL — ABNORMAL LOW (ref 236–444)
UIBC: 129 ug/dL (ref 120–384)

## 2018-05-10 LAB — FERRITIN: Ferritin: 1483 ng/mL — ABNORMAL HIGH (ref 11–307)

## 2018-05-10 MED ORDER — ONDANSETRON HCL 8 MG PO TABS: 8.0000 mg | ORAL_TABLET | Freq: Two times a day (BID) | ORAL | 1 refills | Status: DC | PRN

## 2018-05-10 MED ORDER — PROCHLORPERAZINE MALEATE 10 MG PO TABS: 10.0000 mg | ORAL_TABLET | Freq: Four times a day (QID) | ORAL | 1 refills | Status: DC | PRN

## 2018-05-10 MED ORDER — LIDOCAINE-PRILOCAINE 2.5-2.5 % EX CREA: TOPICAL_CREAM | CUTANEOUS | 3 refills | Status: DC

## 2018-05-10 NOTE — Telephone Encounter (Signed)
Printed avs and calender of upcoming appointment. Per 12/23 los 

## 2018-05-11 LAB — PREPARE RBC (CROSSMATCH)

## 2018-05-12 ENCOUNTER — Inpatient Hospital Stay: Payer: Medicare Other

## 2018-05-12 DIAGNOSIS — Z5111 Encounter for antineoplastic chemotherapy: Secondary | ICD-10-CM | POA: Diagnosis not present

## 2018-05-12 DIAGNOSIS — C221 Intrahepatic bile duct carcinoma: Secondary | ICD-10-CM

## 2018-05-12 MED ORDER — SODIUM CHLORIDE 0.9 % IV SOLN
Freq: Once | INTRAVENOUS | Status: AC
  Administered 2018-05-12: 08:00:00 via INTRAVENOUS
  Filled 2018-05-12: qty 250

## 2018-05-12 MED ORDER — HEPARIN SOD (PORK) LOCK FLUSH 100 UNIT/ML IV SOLN
500.0000 [IU] | Freq: Once | INTRAVENOUS | Status: AC
  Administered 2018-05-12: 500 [IU] via INTRAVENOUS
  Filled 2018-05-12: qty 5

## 2018-05-12 MED ORDER — SODIUM CHLORIDE 0.9% FLUSH
10.0000 mL | Freq: Once | INTRAVENOUS | Status: AC
  Administered 2018-05-12: 10 mL via INTRAVENOUS
  Filled 2018-05-12: qty 10

## 2018-05-12 MED ORDER — HEPARIN SOD (PORK) LOCK FLUSH 100 UNIT/ML IV SOLN
500.0000 [IU] | Freq: Every day | INTRAVENOUS | Status: DC | PRN
  Filled 2018-05-12: qty 5

## 2018-05-12 NOTE — Patient Instructions (Signed)
Blood Transfusion, Adult, Care After This sheet gives you information about how to care for yourself after your procedure. Your doctor may also give you more specific instructions. If you have problems or questions, contact your doctor. Follow these instructions at home:   Take over-the-counter and prescription medicines only as told by your doctor.  Go back to your normal activities as told by your doctor.  Follow instructions from your doctor about how to take care of the area where an IV tube was put into your vein (insertion site). Make sure you: ? Wash your hands with soap and water before you change your bandage (dressing). If there is no soap and water, use hand sanitizer. ? Change your bandage as told by your doctor.  Check your IV insertion site every day for signs of infection. Check for: ? More redness, swelling, or pain. ? More fluid or blood. ? Warmth. ? Pus or a bad smell. Contact a doctor if:  You have more redness, swelling, or pain around the IV insertion site.  You have more fluid or blood coming from the IV insertion site.  Your IV insertion site feels warm to the touch.  You have pus or a bad smell coming from the IV insertion site.  Your pee (urine) turns pink, red, or brown.  You feel weak after doing your normal activities. Get help right away if:  You have signs of a serious allergic or body defense (immune) system reaction, including: ? Itchiness. ? Hives. ? Trouble breathing. ? Anxiety. ? Pain in your chest or lower back. ? Fever, flushing, and chills. ? Fast pulse. ? Rash. ? Watery poop (diarrhea). ? Throwing up (vomiting). ? Dark pee. ? Serious headache. ? Dizziness. ? Stiff neck. ? Yellow color in your face or the white parts of your eyes (jaundice). Summary  After a blood transfusion, return to your normal activities as told by your doctor.  Every day, check for signs of infection where the IV tube was put into your vein.  Some  signs of infection are warm skin, more redness and pain, more fluid or blood, and pus or a bad smell where the needle went in.  Contact your doctor if you feel weak or have any unusual symptoms. This information is not intended to replace advice given to you by your health care provider. Make sure you discuss any questions you have with your health care provider. Document Released: 05/26/2014 Document Revised: 12/28/2015 Document Reviewed: 12/28/2015 Elsevier Interactive Patient Education  2019 [employer]  

## 2018-05-12 NOTE — Progress Notes (Signed)
Upon completion of blood transfusion Pt.'s blood pressure was elevated 181/100 Had Pt. Sit for 20 minutes retake Pt. 178/97 Pt. Stated  that she has not taken her medication today, Stated she's on her way home to take her medication now.

## 2018-05-13 LAB — TYPE AND SCREEN
ABO/RH(D): O POS
Antibody Screen: NEGATIVE
Unit division: 0
Unit division: 0

## 2018-05-13 LAB — BPAM RBC
BLOOD PRODUCT EXPIRATION DATE: 202001182359
Blood Product Expiration Date: 202001202359
ISSUE DATE / TIME: 201912251744
ISSUE DATE / TIME: 201912250748
Unit Type and Rh: 5100
Unit Type and Rh: 5100

## 2018-05-16 ENCOUNTER — Other Ambulatory Visit: Payer: Self-pay | Admitting: Hematology

## 2018-05-17 ENCOUNTER — Ambulatory Visit: Payer: Medicare Other

## 2018-05-17 ENCOUNTER — Other Ambulatory Visit: Payer: Medicare Other

## 2018-05-18 ENCOUNTER — Inpatient Hospital Stay: Payer: Medicare Other

## 2018-05-18 ENCOUNTER — Encounter (HOSPITAL_COMMUNITY): Payer: Self-pay | Admitting: Hematology

## 2018-05-18 ENCOUNTER — Encounter: Payer: Self-pay | Admitting: Hematology

## 2018-05-18 ENCOUNTER — Other Ambulatory Visit: Payer: Medicare Other

## 2018-05-18 VITALS — BP 171/87 | HR 68 | Temp 98.5°F | Resp 20

## 2018-05-18 DIAGNOSIS — K6389 Other specified diseases of intestine: Secondary | ICD-10-CM

## 2018-05-18 DIAGNOSIS — D490 Neoplasm of unspecified behavior of digestive system: Secondary | ICD-10-CM

## 2018-05-18 DIAGNOSIS — Z5111 Encounter for antineoplastic chemotherapy: Secondary | ICD-10-CM | POA: Diagnosis not present

## 2018-05-18 DIAGNOSIS — C221 Intrahepatic bile duct carcinoma: Secondary | ICD-10-CM

## 2018-05-18 DIAGNOSIS — Z95828 Presence of other vascular implants and grafts: Secondary | ICD-10-CM | POA: Insufficient documentation

## 2018-05-18 LAB — CBC WITH DIFFERENTIAL (CANCER CENTER ONLY)
Abs Immature Granulocytes: 0.04 10*3/uL (ref 0.00–0.07)
Basophils Absolute: 0 10*3/uL (ref 0.0–0.1)
Basophils Relative: 0 %
Eosinophils Absolute: 0.3 10*3/uL (ref 0.0–0.5)
Eosinophils Relative: 3 %
HCT: 25.4 % — ABNORMAL LOW (ref 36.0–46.0)
HEMOGLOBIN: 7.8 g/dL — AB (ref 12.0–15.0)
Immature Granulocytes: 1 %
LYMPHS PCT: 13 %
Lymphs Abs: 1 10*3/uL (ref 0.7–4.0)
MCH: 28.2 pg (ref 26.0–34.0)
MCHC: 30.7 g/dL (ref 30.0–36.0)
MCV: 91.7 fL (ref 80.0–100.0)
Monocytes Absolute: 0.8 10*3/uL (ref 0.1–1.0)
Monocytes Relative: 10 %
Neutro Abs: 5.7 10*3/uL (ref 1.7–7.7)
Neutrophils Relative %: 73 %
Platelet Count: 289 10*3/uL (ref 150–400)
RBC: 2.77 MIL/uL — ABNORMAL LOW (ref 3.87–5.11)
RDW: 23.1 % — ABNORMAL HIGH (ref 11.5–15.5)
WBC Count: 7.8 10*3/uL (ref 4.0–10.5)
nRBC: 0 % (ref 0.0–0.2)

## 2018-05-18 LAB — CMP (CANCER CENTER ONLY)
ALBUMIN: 2.1 g/dL — AB (ref 3.5–5.0)
ALT: 6 U/L (ref 0–44)
AST: 13 U/L — ABNORMAL LOW (ref 15–41)
Alkaline Phosphatase: 163 U/L — ABNORMAL HIGH (ref 38–126)
Anion gap: 13 (ref 5–15)
BUN: 21 mg/dL (ref 8–23)
CO2: 27 mmol/L (ref 22–32)
Calcium: 7.9 mg/dL — ABNORMAL LOW (ref 8.9–10.3)
Chloride: 103 mmol/L (ref 98–111)
Creatinine: 4.86 mg/dL (ref 0.60–1.20)
GFR, Est AFR Am: 10 mL/min — ABNORMAL LOW (ref >60)
GFR, Estimated: 8 mL/min — ABNORMAL LOW (ref >60)
Glucose, Bld: 100 mg/dL — ABNORMAL HIGH (ref 70–99)
Potassium: 3.6 mmol/L (ref 3.5–5.1)
Sodium: 143 mmol/L (ref 135–145)
Total Bilirubin: 0.4 mg/dL (ref 0.3–1.2)
Total Protein: 7.1 g/dL (ref 6.5–8.1)

## 2018-05-18 LAB — IRON AND TIBC
Iron: 62 ug/dL (ref 41–142)
Saturation Ratios: 34 % (ref 21–57)
TIBC: 184 ug/dL — ABNORMAL LOW (ref 236–444)
UIBC: 121 ug/dL (ref 120–384)

## 2018-05-18 LAB — FERRITIN: FERRITIN: 2331 ng/mL — AB (ref 11–307)

## 2018-05-18 MED ORDER — SODIUM CHLORIDE 0.9 % IV SOLN
12.5000 mg/m2 | Freq: Once | INTRAVENOUS | Status: AC
  Administered 2018-05-18: 21 mg via INTRAVENOUS
  Filled 2018-05-18: qty 21

## 2018-05-18 MED ORDER — SODIUM CHLORIDE 0.9% FLUSH
10.0000 mL | INTRAVENOUS | Status: DC | PRN
  Administered 2018-05-18: 10 mL
  Filled 2018-05-18: qty 10

## 2018-05-18 MED ORDER — SODIUM CHLORIDE 0.9 % IV SOLN
Freq: Once | INTRAVENOUS | Status: DC
  Filled 2018-05-18: qty 250

## 2018-05-18 MED ORDER — SODIUM CHLORIDE 0.9 % IV SOLN
500.0000 mg/m2 | Freq: Once | INTRAVENOUS | Status: AC
  Administered 2018-05-18: 836 mg via INTRAVENOUS
  Filled 2018-05-18: qty 21.99

## 2018-05-18 MED ORDER — PALONOSETRON HCL INJECTION 0.25 MG/5ML
0.2500 mg | Freq: Once | INTRAVENOUS | Status: AC
  Administered 2018-05-18: 0.25 mg via INTRAVENOUS

## 2018-05-18 MED ORDER — HEPARIN SOD (PORK) LOCK FLUSH 100 UNIT/ML IV SOLN
500.0000 [IU] | Freq: Once | INTRAVENOUS | Status: AC | PRN
  Administered 2018-05-18: 500 [IU]
  Filled 2018-05-18: qty 5

## 2018-05-18 MED ORDER — SODIUM CHLORIDE 0.9 % IV SOLN
Freq: Once | INTRAVENOUS | Status: AC
  Administered 2018-05-18: 10:00:00 via INTRAVENOUS
  Filled 2018-05-18: qty 5

## 2018-05-18 MED ORDER — PALONOSETRON HCL INJECTION 0.25 MG/5ML
INTRAVENOUS | Status: AC
  Filled 2018-05-18: qty 5

## 2018-05-18 MED ORDER — SODIUM CHLORIDE 0.9 % IV SOLN
Freq: Once | INTRAVENOUS | Status: AC
  Administered 2018-05-18: 09:00:00 via INTRAVENOUS
  Filled 2018-05-18: qty 250

## 2018-05-18 NOTE — Patient Instructions (Addendum)
[facility] Discharge Instructions for Patients Receiving Chemotherapy  Today you received the following chemotherapy agents: Gemcitabine; Cisplatin  To help prevent nausea and vomiting after your treatment, we encourage you to take your nausea medication as directed.  If you develop nausea and vomiting that is not controlled by your nausea medication, call the clinic.   BELOW ARE SYMPTOMS THAT SHOULD BE REPORTED IMMEDIATELY:  *FEVER GREATER THAN 100.5 F  *CHILLS WITH OR WITHOUT FEVER  NAUSEA AND VOMITING THAT IS NOT CONTROLLED WITH YOUR NAUSEA MEDICATION  *UNUSUAL SHORTNESS OF BREATH  *UNUSUAL BRUISING OR BLEEDING  TENDERNESS IN MOUTH AND THROAT WITH OR WITHOUT PRESENCE OF ULCERS  *URINARY PROBLEMS  *BOWEL PROBLEMS  UNUSUAL RASH Items with * indicate a potential emergency and should be followed up as soon as possible.  Feel free to call the clinic should you have any questions or concerns. The clinic phone number is [phone].  Please show the [facility] at check-in to the Emergency Department and triage nurse.  Gemcitabine injection What is this medicine? GEMCITABINE (jem SYE ta been) is a chemotherapy drug. This medicine is used to treat many types of cancer like breast cancer, lung cancer, pancreatic cancer, and ovarian cancer. This medicine may be used for other purposes; ask your health care provider or pharmacist if you have questions. COMMON BRAND NAME(S): Gemzar, Infugem What should I tell my health care provider before I take this medicine? They need to know if you have any of these conditions: -blood disorders -infection -kidney disease -liver disease -lung or breathing disease, like asthma -recent or ongoing radiation therapy -an unusual or allergic reaction to gemcitabine, other chemotherapy, other medicines, foods, dyes, or preservatives -pregnant or trying to get pregnant -breast-feeding How should I use this medicine? This  drug is given as an infusion into a vein. It is administered in a hospital or clinic by a specially trained health care professional. Talk to your pediatrician regarding the use of this medicine in children. Special care may be needed. Overdosage: If you think you have taken too much of this medicine contact a poison control center or emergency room at once. NOTE: This medicine is only for you. Do not share this medicine with others. What if I miss a dose? It is important not to miss your dose. Call your doctor or health care professional if you are unable to keep an appointment. What may interact with this medicine? -medicines to increase blood counts like filgrastim, pegfilgrastim, sargramostim -some other chemotherapy drugs like cisplatin -vaccines Talk to your doctor or health care professional before taking any of these medicines: -acetaminophen -aspirin -ibuprofen -ketoprofen -naproxen This list may not describe all possible interactions. Give your health care provider a list of all the medicines, herbs, non-prescription drugs, or dietary supplements you use. Also tell them if you smoke, drink alcohol, or use illegal drugs. Some items may interact with your medicine. What should I watch for while using this medicine? Visit your doctor for checks on your progress. This drug may make you feel generally unwell. This is not uncommon, as chemotherapy can affect healthy cells as well as cancer cells. Report any side effects. Continue your course of treatment even though you feel ill unless your doctor tells you to stop. In some cases, you may be given additional medicines to help with side effects. Follow all directions for their use. Call your doctor or health care professional for advice if you get a fever, chills or sore throat,  or other symptoms of a cold or flu. Do not treat yourself. This drug decreases your body's ability to fight infections. Try to avoid being around people who are  sick. This medicine may increase your risk to bruise or bleed. Call your doctor or health care professional if you notice any unusual bleeding. Be careful brushing and flossing your teeth or using a toothpick because you may get an infection or bleed more easily. If you have any dental work done, tell your dentist you are receiving this medicine. Avoid taking products that contain aspirin, acetaminophen, ibuprofen, naproxen, or ketoprofen unless instructed by your doctor. These medicines may hide a fever. Do not become pregnant while taking this medicine or for 6 months after stopping it. Women should inform their doctor if they wish to become pregnant or think they might be pregnant. Men should not father a child while taking this medicine and for 3 months after stopping it. There is a potential for serious side effects to an unborn child. Talk to your health care professional or pharmacist for more information. Do not breast-feed an infant while taking this medicine or for at least 1 week after stopping it. Men should inform their doctors if they wish to father a child. This medicine may lower sperm counts. Talk with your doctor or health care professional if you are concerned about your fertility. What side effects may I notice from receiving this medicine? Side effects that you should report to your doctor or health care professional as soon as possible: -allergic reactions like skin rash, itching or hives, swelling of the face, lips, or tongue -breathing problems -pain, redness, or irritation at site where injected -signs and symptoms of a dangerous change in heartbeat or heart rhythm like chest pain; dizziness; fast or irregular heartbeat; palpitations; feeling faint or lightheaded, falls; breathing problems -signs of decreased platelets or bleeding - bruising, pinpoint red spots on the skin, black, tarry stools, blood in the urine -signs of decreased red blood cells - unusually weak or tired,  feeling faint or lightheaded, falls -signs of infection - fever or chills, cough, sore throat, pain or difficulty passing urine -signs and symptoms of kidney injury like trouble passing urine or change in the amount of urine -signs and symptoms of liver injury like dark yellow or brown urine; general ill feeling or flu-like symptoms; light-colored stools; loss of appetite; nausea; right upper belly pain; unusually weak or tired; yellowing of the eyes or skin -swelling of ankles, feet, hands Side effects that usually do not require medical attention (report to your doctor or health care professional if they continue or are bothersome): -constipation -diarrhea -hair loss -loss of appetite -nausea -rash -vomiting This list may not describe all possible side effects. Call your doctor for medical advice about side effects. You may report side effects to FDA at 1-800-FDA-1088. Where should I keep my medicine? This drug is given in a hospital or clinic and will not be stored at home. NOTE: This sheet is a summary. It may not cover all possible information. If you have questions about this medicine, talk to your doctor, pharmacist, or health care provider.  2019 Elsevier/Gold Standard (2017-07-29 18:06:11)   Cisplatin injection What is this medicine? CISPLATIN (SIS pla tin) is a chemotherapy drug. It targets fast dividing cells, like cancer cells, and causes these cells to die. This medicine is used to treat many types of cancer like bladder, ovarian, and testicular cancers. This medicine may be used for other purposes; ask  your health care provider or pharmacist if you have questions. COMMON BRAND NAME(S): Platinol, Platinol -AQ What should I tell my health care provider before I take this medicine? They need to know if you have any of these conditions: -blood disorders -hearing problems -kidney disease -recent or ongoing radiation therapy -an unusual or allergic reaction to cisplatin,  carboplatin, other chemotherapy, other medicines, foods, dyes, or preservatives -pregnant or trying to get pregnant -breast-feeding How should I use this medicine? This drug is given as an infusion into a vein. It is administered in a hospital or clinic by a specially trained health care professional. Talk to your pediatrician regarding the use of this medicine in children. Special care may be needed. Overdosage: If you think you have taken too much of this medicine contact a poison control center or emergency room at once. NOTE: This medicine is only for you. Do not share this medicine with others. What if I miss a dose? It is important not to miss a dose. Call your doctor or health care professional if you are unable to keep an appointment. What may interact with this medicine? -dofetilide -foscarnet -medicines for seizures -medicines to increase blood counts like filgrastim, pegfilgrastim, sargramostim -probenecid -pyridoxine used with altretamine -rituximab -some antibiotics like amikacin, gentamicin, neomycin, polymyxin B, streptomycin, tobramycin -sulfinpyrazone -vaccines -zalcitabine Talk to your doctor or health care professional before taking any of these medicines: -acetaminophen -aspirin -ibuprofen -ketoprofen -naproxen This list may not describe all possible interactions. Give your health care provider a list of all the medicines, herbs, non-prescription drugs, or dietary supplements you use. Also tell them if you smoke, drink alcohol, or use illegal drugs. Some items may interact with your medicine. What should I watch for while using this medicine? Your condition will be monitored carefully while you are receiving this medicine. You will need important blood work done while you are taking this medicine. This drug may make you feel generally unwell. This is not uncommon, as chemotherapy can affect healthy cells as well as cancer cells. Report any side effects. Continue  your course of treatment even though you feel ill unless your doctor tells you to stop. In some cases, you may be given additional medicines to help with side effects. Follow all directions for their use. Call your doctor or health care professional for advice if you get a fever, chills or sore throat, or other symptoms of a cold or flu. Do not treat yourself. This drug decreases your body's ability to fight infections. Try to avoid being around people who are sick. This medicine may increase your risk to bruise or bleed. Call your doctor or health care professional if you notice any unusual bleeding. Be careful brushing and flossing your teeth or using a toothpick because you may get an infection or bleed more easily. If you have any dental work done, tell your dentist you are receiving this medicine. Avoid taking products that contain aspirin, acetaminophen, ibuprofen, naproxen, or ketoprofen unless instructed by your doctor. These medicines may hide a fever. Do not become pregnant while taking this medicine. Women should inform their doctor if they wish to become pregnant or think they might be pregnant. There is a potential for serious side effects to an unborn child. Talk to your health care professional or pharmacist for more information. Do not breast-feed an infant while taking this medicine. Drink fluids as directed while you are taking this medicine. This will help protect your kidneys. Call your doctor or health care professional  if you get diarrhea. Do not treat yourself. What side effects may I notice from receiving this medicine? Side effects that you should report to your doctor or health care professional as soon as possible: -allergic reactions like skin rash, itching or hives, swelling of the face, lips, or tongue -signs of infection - fever or chills, cough, sore throat, pain or difficulty passing urine -signs of decreased platelets or bleeding - bruising, pinpoint red spots on the  skin, black, tarry stools, nosebleeds -signs of decreased red blood cells - unusually weak or tired, fainting spells, lightheadedness -breathing problems -changes in hearing -gout pain -low blood counts - This drug may decrease the number of white blood cells, red blood cells and platelets. You may be at increased risk for infections and bleeding. -nausea and vomiting -pain, swelling, redness or irritation at the injection site -pain, tingling, numbness in the hands or feet -problems with balance, movement -trouble passing urine or change in the amount of urine Side effects that usually do not require medical attention (report to your doctor or health care professional if they continue or are bothersome): -changes in vision -loss of appetite -metallic taste in the mouth or changes in taste This list may not describe all possible side effects. Call your doctor for medical advice about side effects. You may report side effects to FDA at 1-800-FDA-1088. Where should I keep my medicine? This drug is given in a hospital or clinic and will not be stored at home. NOTE: This sheet is a summary. It may not cover all possible information. If you have questions about this medicine, talk to your doctor, pharmacist, or health care provider.  2019 Elsevier/Gold Standard (2007-08-10 14:40:54)

## 2018-05-18 NOTE — Progress Notes (Signed)
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 Advised what is needed to apply(proof of household income).  Gave my card for any additional financial questions or concerns.

## 2018-05-18 NOTE — Progress Notes (Signed)
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 Ernestina Penna last office visit note, will proceed with treatment with dose reductions.   Per Dr. Benay Spice, [place] to treat with patient's current hemoglobin and creatinine levels. No further orders at this time.

## 2018-05-24 ENCOUNTER — Other Ambulatory Visit: Payer: Self-pay

## 2018-05-24 DIAGNOSIS — C221 Intrahepatic bile duct carcinoma: Secondary | ICD-10-CM

## 2018-05-25 ENCOUNTER — Inpatient Hospital Stay: Payer: Medicare Other | Attending: Hematology

## 2018-05-25 ENCOUNTER — Telehealth: Payer: Self-pay | Admitting: Hematology

## 2018-05-25 DIAGNOSIS — I10 Essential (primary) hypertension: Secondary | ICD-10-CM | POA: Diagnosis not present

## 2018-05-25 DIAGNOSIS — I700 Atherosclerosis of aorta: Secondary | ICD-10-CM | POA: Diagnosis not present

## 2018-05-25 DIAGNOSIS — G893 Neoplasm related pain (acute) (chronic): Secondary | ICD-10-CM | POA: Insufficient documentation

## 2018-05-25 DIAGNOSIS — Z992 Dependence on renal dialysis: Secondary | ICD-10-CM | POA: Diagnosis not present

## 2018-05-25 DIAGNOSIS — N186 End stage renal disease: Secondary | ICD-10-CM | POA: Diagnosis not present

## 2018-05-25 DIAGNOSIS — D490 Neoplasm of unspecified behavior of digestive system: Secondary | ICD-10-CM

## 2018-05-25 DIAGNOSIS — Z5111 Encounter for antineoplastic chemotherapy: Secondary | ICD-10-CM | POA: Insufficient documentation

## 2018-05-25 DIAGNOSIS — C221 Intrahepatic bile duct carcinoma: Secondary | ICD-10-CM | POA: Diagnosis present

## 2018-05-25 DIAGNOSIS — R918 Other nonspecific abnormal finding of lung field: Secondary | ICD-10-CM | POA: Diagnosis not present

## 2018-05-25 DIAGNOSIS — Z79899 Other long term (current) drug therapy: Secondary | ICD-10-CM | POA: Insufficient documentation

## 2018-05-25 DIAGNOSIS — K6389 Other specified diseases of intestine: Secondary | ICD-10-CM

## 2018-05-25 DIAGNOSIS — D638 Anemia in other chronic diseases classified elsewhere: Secondary | ICD-10-CM | POA: Diagnosis not present

## 2018-05-25 LAB — CBC WITH DIFFERENTIAL (CANCER CENTER ONLY)
Abs Immature Granulocytes: 0.03 10*3/uL (ref 0.00–0.07)
Basophils Absolute: 0 10*3/uL (ref 0.0–0.1)
Basophils Relative: 0 %
EOS ABS: 0.1 10*3/uL (ref 0.0–0.5)
Eosinophils Relative: 4 %
HCT: 25.8 % — ABNORMAL LOW (ref 36.0–46.0)
Hemoglobin: 7.9 g/dL — ABNORMAL LOW (ref 12.0–15.0)
Immature Granulocytes: 1 %
Lymphocytes Relative: 32 %
Lymphs Abs: 0.9 10*3/uL (ref 0.7–4.0)
MCH: 28.3 pg (ref 26.0–34.0)
MCHC: 30.6 g/dL (ref 30.0–36.0)
MCV: 92.5 fL (ref 80.0–100.0)
Monocytes Absolute: 0.2 10*3/uL (ref 0.1–1.0)
Monocytes Relative: 8 %
NEUTROS PCT: 55 %
Neutro Abs: 1.6 10*3/uL — ABNORMAL LOW (ref 1.7–7.7)
Platelet Count: 212 10*3/uL (ref 150–400)
RBC: 2.79 MIL/uL — ABNORMAL LOW (ref 3.87–5.11)
RDW: 23 % — ABNORMAL HIGH (ref 11.5–15.5)
WBC Count: 2.9 10*3/uL — ABNORMAL LOW (ref 4.0–10.5)
nRBC: 0 % (ref 0.0–0.2)

## 2018-05-25 LAB — IRON AND TIBC
IRON: 47 ug/dL (ref 41–142)
Saturation Ratios: 24 % (ref 21–57)
TIBC: 195 ug/dL — AB (ref 236–444)
UIBC: 148 ug/dL (ref 120–384)

## 2018-05-25 LAB — CMP (CANCER CENTER ONLY)
ALT: 12 U/L (ref 0–44)
ANION GAP: 11 (ref 5–15)
AST: 15 U/L (ref 15–41)
Albumin: 2.4 g/dL — ABNORMAL LOW (ref 3.5–5.0)
Alkaline Phosphatase: 145 U/L — ABNORMAL HIGH (ref 38–126)
BUN: 28 mg/dL — ABNORMAL HIGH (ref 8–23)
CO2: 28 mmol/L (ref 22–32)
Calcium: 8.1 mg/dL — ABNORMAL LOW (ref 8.9–10.3)
Chloride: 103 mmol/L (ref 98–111)
Creatinine: 5.11 mg/dL (ref 0.44–1.00)
GFR, EST AFRICAN AMERICAN: 9 mL/min — AB (ref >60)
GFR, Estimated: 8 mL/min — ABNORMAL LOW (ref >60)
Glucose, Bld: 88 mg/dL (ref 70–99)
Potassium: 4.2 mmol/L (ref 3.5–5.1)
Sodium: 142 mmol/L (ref 135–145)
Total Bilirubin: 0.3 mg/dL (ref 0.3–1.2)
Total Protein: 7.3 g/dL (ref 6.5–8.1)

## 2018-05-25 LAB — PREPARE RBC (CROSSMATCH)

## 2018-05-25 LAB — FERRITIN: Ferritin: 2577 ng/mL — ABNORMAL HIGH (ref 11–307)

## 2018-05-25 NOTE — Telephone Encounter (Signed)
Patient stated she received a phone call to get labs and the appointment was not added.  Added the lab appointment for the patient due to the patient getting a blood transfusion 01/09.

## 2018-05-27 ENCOUNTER — Ambulatory Visit (HOSPITAL_COMMUNITY)
Admission: RE | Admit: 2018-05-27 | Discharge: 2018-05-27 | Disposition: A | Discharge status: Home / Self Care | Payer: Medicare Other | Source: Ambulatory Visit | Attending: Hematology | Admitting: Hematology

## 2018-05-27 ENCOUNTER — Other Ambulatory Visit: Payer: Self-pay

## 2018-05-27 DIAGNOSIS — C221 Intrahepatic bile duct carcinoma: Secondary | ICD-10-CM | POA: Diagnosis present

## 2018-05-27 DIAGNOSIS — D649 Anemia, unspecified: Secondary | ICD-10-CM | POA: Insufficient documentation

## 2018-05-27 MED ORDER — SODIUM CHLORIDE 0.9% FLUSH
10.0000 mL | INTRAVENOUS | Status: AC | PRN
  Administered 2018-05-27: 10 mL

## 2018-05-27 MED ORDER — METOPROLOL TARTRATE 12.5 MG HALF TABLET
12.5000 mg | ORAL_TABLET | Freq: Once | ORAL | Status: AC
  Administered 2018-05-27: 12.5 mg via ORAL
  Filled 2018-05-27: qty 1

## 2018-05-27 MED ORDER — ACETAMINOPHEN 325 MG PO TABS
650.0000 mg | ORAL_TABLET | Freq: Once | ORAL | Status: AC
  Administered 2018-05-27: 650 mg via ORAL
  Filled 2018-05-27: qty 2

## 2018-05-27 MED ORDER — HEPARIN SOD (PORK) LOCK FLUSH 100 UNIT/ML IV SOLN
500.0000 [IU] | Freq: Every day | INTRAVENOUS | Status: AC | PRN
  Administered 2018-05-27: 500 [IU]
  Filled 2018-05-27: qty 5

## 2018-05-27 MED ORDER — HEPARIN SOD (PORK) LOCK FLUSH 100 UNIT/ML IV SOLN: 250.0000 [IU] | INTRAVENOUS | Status: DC | PRN

## 2018-05-27 MED ORDER — SODIUM CHLORIDE 0.9% IV SOLUTION
250.0000 mL | Freq: Once | INTRAVENOUS | Status: AC
  Administered 2018-05-27: 250 mL via INTRAVENOUS

## 2018-05-27 MED ORDER — SODIUM CHLORIDE 0.9% FLUSH: 3.0000 mL | INTRAVENOUS | Status: DC | PRN

## 2018-05-27 NOTE — Discharge Instructions (Signed)
Blood Transfusion, Adult, Care After This sheet gives you information about how to care for yourself after your procedure. Your doctor may also give you more specific instructions. If you have problems or questions, contact your doctor. Follow these instructions at home:   Take over-the-counter and prescription medicines only as told by your doctor.  Go back to your normal activities as told by your doctor.  Follow instructions from your doctor about how to take care of the area where an IV tube was put into your vein (insertion site). Make sure you: ? Wash your hands with soap and water before you change your bandage (dressing). If there is no soap and water, use hand sanitizer. ? Change your bandage as told by your doctor.  Check your IV insertion site every day for signs of infection. Check for: ? More redness, swelling, or pain. ? More fluid or blood. ? Warmth. ? Pus or a bad smell. Contact a doctor if:  You have more redness, swelling, or pain around the IV insertion site.  You have more fluid or blood coming from the IV insertion site.  Your IV insertion site feels warm to the touch.  You have pus or a bad smell coming from the IV insertion site.  Your pee (urine) turns pink, red, or brown.  You feel weak after doing your normal activities. Get help right away if:  You have signs of a serious allergic or body defense (immune) system reaction, including: ? Itchiness. ? Hives. ? Trouble breathing. ? Anxiety. ? Pain in your chest or lower back. ? Fever, flushing, and chills. ? Fast pulse. ? Rash. ? Watery poop (diarrhea). ? Throwing up (vomiting). ? Dark pee. ? Serious headache. ? Dizziness. ? Stiff neck. ? Yellow color in your face or the white parts of your eyes (jaundice). Summary  After a blood transfusion, return to your normal activities as told by your doctor.  Every day, check for signs of infection where the IV tube was put into your vein.  Some  signs of infection are warm skin, more redness and pain, more fluid or blood, and pus or a bad smell where the needle went in.  Contact your doctor if you feel weak or have any unusual symptoms. This information is not intended to replace advice given to you by your health care provider. Make sure you discuss any questions you have with your health care provider. Document Released: 05/26/2014 Document Revised: 12/28/2015 Document Reviewed: 12/28/2015 Elsevier Interactive Patient Education  2019 [employer]  

## 2018-05-27 NOTE — Progress Notes (Signed)
[place]   [phone] [phone]   Clinic Follow up Note   Patient Care Team: Glendon Axe, MD as PCP - General (Family Medicine) [name], MD as PCP - Cardiology (Cardiology) Dwana Melena, MD as Attending Physician (Nephrology) Truitt Merle, MD as Consulting Physician (Hematology) 06/01/2018  CHIEF COMPLAINT: F/u on liver adenocarcinoma    SUMMARY OF ONCOLOGIC HISTORY:   Intrahepatic cholangiocarcinoma (Mary Chapman)   02/23/2018 Imaging    CT AP W Contrast 02/23/18  IMPRESSION: 1. Large multi-cystic lesion in the central aspect of the liver adjacent to the gallbladder fossa, with several smaller satellite lesions. These are all new compared to prior CT the chest, abdomen and pelvis 08/07/2016, concerning for intrahepatic abscesses. 2. Extensive mural thickening and inflammatory changes in the region of the cecum. This is nonspecific, and could reflect either right-sided diverticulitis, focal area of colitis, or potentially even underlying neoplasm. 3. Cholelithiasis. Gallbladder is nearly completely contracted without surrounding inflammatory changes to suggest an acute cholecystitis at this time. 4. Left-sided nephrolithiasis measuring up to 8 mm in the upper pole collecting system of left kidney. No ureteral stones or findings of urinary tract obstruction. 5. Aortic atherosclerosis.    02/27/2018 Initial Biopsy    Diagnosis 02/27/18  Liver, needle/core biopsy, Right Hepatic Lobe - ADENOCARCINOMA. SEE NOTE.    03/05/2018 Procedure    Colonoscopy 03/05/18  IMPRESSION - Preparation of the colon was fair. - Non-thrombosed external hemorrhoids found on digital rectal exam. - There was significant looping of the colon. - Severe diverticulosis in the recto-sigmoid colon, in the sigmoid colon, in the descending colon, in the transverse colon, at the hepatic flexure, in the ascending colon and in the cecum. There was no evidence of  diverticular bleeding. - A single (solitary) ulcer in the cecum - highly concerning for underlying malignancy. Biopsied. Phlegmonous change is possible, though often in setting of complicated diverticulosis/diverticulitis ulceration would not be as common. - Erythematous mucosa in the transverse colon, at the hepatic flexure, in the ascending colon and in the cecum. Biopsied. - Normal mucosa in the rectum, in the recto-sigmoid colon, in the sigmoid colon and in the descending colon. Biopsied. - Non-bleeding non-thrombosed external and internal hemorrhoids. -----Negative for malignancy     03/11/2018 Initial Diagnosis    Intrahepatic cholangiocarcinoma (Mary Chapman)    04/02/2018 Imaging    CT CAP W contrast 04/02/18  IMPRESSION: 1. 4 mm subpleural nodule in the periphery of the right lower lobe. This is nonspecific, but strongly favored to represent a benign subpleural lymph node. Attention on follow-up studies is recommended to exclude the possibility of metastatic disease. 2. Diffuse bronchial wall thickening with moderate centrilobular and mild paraseptal emphysema; imaging findings suggestive of underlying COPD. 3. Aortic atherosclerosis, in addition to left main and left anterior descending coronary artery disease. Assessment for potential risk factor modification, dietary therapy or pharmacologic therapy may be warranted, if clinically indicated. 4. Nonobstructive calculi in the upper pole collecting system of left kidney measuring up to 7 mm.      Chemotherapy    PENDING Gemcitabine and Cisplatin every 2 weeks      04/27/2018 Cancer Staging    Staging form: Intrahepatic Bile Duct, AJCC 8th Edition - Clinical: Stage IIIB (cT2, cN1, cM0) - Signed by Truitt Merle, MD on 04/27/2018    05/18/2018 -  Chemotherapy    The patient had palonosetron (ALOXI) injection 0.25 mg, 0.25 mg, Intravenous,  Once, 2 of 4 cycles Administration: 0.25 mg (05/18/2018),  0.25 mg (06/01/2018) CISplatin  (PLATINOL) 21 mg in sodium chloride 0.9 % 250 mL chemo infusion, 12.5 mg/m2 = 21 mg (100 % of original dose 12.5 mg/m2), Intravenous,  Once, 2 of 4 cycles Dose modification: 12.5 mg/m2 (original dose 12.5 mg/m2, Cycle 1, Reason: Provider Judgment, Comment: HD patient ) Administration: 21 mg (05/18/2018), 21 mg (06/01/2018) gemcitabine (GEMZAR) 836 mg in sodium chloride 0.9 % 100 mL chemo infusion, 500 mg/m2 = 836 mg (100 % of original dose 500 mg/m2), Intravenous,  Once, 2 of 4 cycles Dose modification: 500 mg/m2 (original dose 500 mg/m2, Cycle 1, Reason: Provider Judgment, Comment: HD patient ) Administration: 836 mg (05/18/2018), 836 mg (06/01/2018) fosaprepitant (EMEND) 150 mg, dexamethasone (DECADRON) 12 mg in sodium chloride 0.9 % 145 mL IVPB, , Intravenous,  Once, 2 of 4 cycles Administration:  (05/18/2018),  (06/01/2018)  for chemotherapy treatment.      CURRENT THERAPY Gemcitabine and Cisplatin every 2 weeks to start on 05/17/2018, with 50% dose reduction   INTERVAL HISTORY: Mary Chapman is a 73 y.o. female who is here for follow-up. Today, she is here with her family member. She tolerated last cycle well with a blood transfusion. She still has abdominal pain that is 6/10 in severity. She takes oxycodone for the pain once daily in the morning. Oxycodone relieves that pain for 3-4 hours. She sleeps well. She uses Tylenol as needed too. She tries to stay active and carry out daily activities without limitations. She denies nausea after chemo. She only felt sick last Sunday, and she takes nausea medications as needed. She denies shortness of breath, cough and fever.    Pertinent positives and negatives of review of systems are listed and detailed within the above HPI.  REVIEW OF SYSTEMS:   Constitutional: Denies fevers, chills or abnormal weight loss Eyes: Denies blurriness of vision Ears, nose, mouth, throat, and face: Denies mucositis or sore throat Respiratory: Denies cough, dyspnea or  wheezes Cardiovascular: Denies palpitation, chest discomfort or lower extremity swelling Gastrointestinal:  Denies heartburn or change in bowel habits (+) RUQ abdominal pain (+) occasional nausea  Skin: Denies abnormal skin rashes Lymphatics: Denies new lymphadenopathy or easy bruising Neurological:Denies numbness, tingling or new weaknesses Behavioral/Psych: Mood is stable, no new changes  All other systems were reviewed with the patient and are negative.  MEDICAL HISTORY:  Past Medical History:  Diagnosis Date  . Diverticulitis   . Focal segmental glomerulosclerosis    ESRD on MWF HD  . Hypertension   . Tobacco abuse     SURGICAL HISTORY: Past Surgical History:  Procedure Laterality Date  . AV FISTULA PLACEMENT Left 03/18/2017   Procedure: Brachiocephalic ARTERIOVENOUS (AV) FISTULA CREATION left arm;  Surgeon: Elam Dutch, MD;  Location: [place];  Service: Vascular;  Laterality: Left;  . BIOPSY  03/05/2018   Procedure: BIOPSY;  Surgeon: Rush Landmark Telford Nab., MD;  Location: [place];  Service: Gastroenterology;;  enteroscopy bx /cytology brushing Greig Castilla bx  . COLONOSCOPY WITH PROPOFOL N/A 03/05/2018   Procedure: COLONOSCOPY WITH PROPOFOL;  Surgeon: Rush Landmark Telford Nab., MD;  Location: [place];  Service: Gastroenterology;  Laterality: N/A;  Bx, Spot  . ENTEROSCOPY N/A 03/05/2018   Procedure: ENTEROSCOPY;  Surgeon: Rush Landmark Telford Nab., MD;  Location: [place];  Service: Gastroenterology;  Laterality: N/A;  BX, Brushing, & Spot  . INSERTION OF DIALYSIS CATHETER N/A 03/18/2017   Procedure: INSERTION OF DIALYSIS CATHETER;  Surgeon: Elam Dutch, MD;  Location: [place];  Service: Vascular;  Laterality: N/A;  .  IR GENERIC HISTORICAL  08/15/2016   IR US GUIDE VASC ACCESS RIGHT 08/15/2016 Arne Cleveland, MD [facility]  . IR GENERIC HISTORICAL  08/15/2016   IR FLUORO GUIDE CV LINE RIGHT 08/15/2016 Arne Cleveland, MD [facility]  . IR IMAGING GUIDED PORT  INSERTION  05/06/2018  . SUBMUCOSAL INJECTION  03/05/2018   Procedure: SUBMUCOSAL INJECTION;  Surgeon: Rush Landmark Telford Nab., MD;  Location: [place];  Service: Gastroenterology;;  spot tatoo  . TUBAL LIGATION      I have reviewed the social history and family history with the patient and they are unchanged from previous note.  ALLERGIES:  has No Known Allergies.  MEDICATIONS:  Current Outpatient Medications  Medication Sig Dispense Refill  . folic acid (FOLVITE) 1 MG tablet Take 1 tablet (1 mg total) by mouth daily. 30 tablet 0  . lidocaine-prilocaine (EMLA) cream Apply to affected area once 30 g 3  . metoprolol tartrate (LOPRESSOR) 25 MG tablet Take 0.5 tablets (12.5 mg total) by mouth 2 (two) times daily. 30 tablet 0  . multivitamin (RENA-VIT) TABS tablet Take 1 tablet by mouth at bedtime. 30 tablet 3  . nicotine (NICODERM CQ - DOSED IN MG/24 HOURS) 21 mg/24hr patch Place 1 patch (21 mg total) onto the skin daily. 28 patch 0  . Nutritional Supplements (FEEDING SUPPLEMENT, NEPRO CARB STEADY,) LIQD Take 237 mLs by mouth 3 (three) times daily as needed (Supplement). 90 Can 0  . ondansetron (ZOFRAN) 8 MG tablet Take 1 tablet (8 mg total) by mouth 2 (two) times daily as needed. Start on the third day after chemotherapy. 30 tablet 1  . oxyCODONE-acetaminophen (PERCOCET) 5-325 MG tablet Take 1 tablet by mouth every 6 (six) hours as needed for severe pain. 20 tablet 0  . pantoprazole (PROTONIX) 40 MG tablet TAKE 1 TABLET BY MOUTH TWICE A DAY 60 tablet 1  . polyethylene glycol (MIRALAX) packet Take 17 g by mouth daily. 30 each 0  . prochlorperazine (COMPAZINE) 10 MG tablet Take 1 tablet (10 mg total) by mouth every 6 (six) hours as needed (Nausea or vomiting). 30 tablet 1  . senna-docusate (SENOKOT-S) 8.6-50 MG tablet Take 2 tablets by mouth 2 (two) times daily. 30 tablet 0  . Vitamin D, Ergocalciferol, (DRISDOL) 50000 units CAPS capsule Take 50,000 Units by mouth once a week.  5   No  current facility-administered medications for this visit.    Facility-Administered Medications Ordered in Other Visits  Medication Dose Route Frequency Provider Last Rate Last Dose  . 0.9 %  sodium chloride infusion   Intravenous Once Truitt Merle, MD      . sodium chloride flush (NS) 0.9 % injection 10 mL  10 mL Intracatheter PRN Truitt Merle, MD   10 mL at 06/01/18 1447    PHYSICAL EXAMINATION: ECOG PERFORMANCE STATUS: 1 - Symptomatic but completely ambulatory Vitals: BP 183/91 Pulse 72 RR 18 GENERAL:alert, no distress and comfortable SKIN: skin color, texture, turgor are normal, no rashes or significant lesions EYES: normal, Conjunctiva are pink and non-injected, sclera clear OROPHARYNX:no exudate, no erythema and lips, buccal mucosa, and tongue normal  NECK: supple, thyroid normal size, non-tender, without nodularity LYMPH:  no palpable lymphadenopathy in the cervical, axillary or inguinal LUNGS: clear to auscultation and percussion with normal breathing effort HEART: regular rate & rhythm and no murmurs and no lower extremity edema ABDOMEN:abdomen soft and normal bowel sounds (+) mild RUQ tenderness Musculoskeletal:no cyanosis of digits and no clubbing  NEURO: alert & oriented x 3 with  fluent speech, no focal motor/sensory deficits  LABORATORY DATA:  I have reviewed the data as listed CBC Latest Ref Rng & Units 06/01/2018 05/25/2018 05/18/2018  WBC 4.0 - 10.5 K/uL 7.5 2.9(L) 7.8  Hemoglobin 12.0 - 15.0 g/dL 9.2(L) 7.9(L) 7.8(L)  Hematocrit 36.0 - 46.0 % 28.7(L) 25.8(L) 25.4(L)  Platelets 150 - 400 K/uL 292 212 289     CMP Latest Ref Rng & Units 06/01/2018 05/25/2018 05/18/2018  Glucose 70 - 99 mg/dL 96 88 100(H)  BUN 8 - 23 mg/dL 31(H) 28(H) 21  Creatinine 0.44 - 1.00 mg/dL 5.40(HH) 5.11(HH) 4.86(HH)  Sodium 135 - 145 mmol/L 141 142 143  Potassium 3.5 - 5.1 mmol/L 3.8 4.2 3.6  Chloride 98 - 111 mmol/L 99 103 103  CO2 22 - 32 mmol/L 31 28 27   Calcium 8.9 - 10.3 mg/dL 8.2(L) 8.1(L)  7.9(L)  Total Protein 6.5 - 8.1 g/dL 7.3 7.3 7.1  Total Bilirubin 0.3 - 1.2 mg/dL 0.3 0.3 0.4  Alkaline Phos 38 - 126 U/L 144(H) 145(H) 163(H)  AST 15 - 41 U/L 13(L) 15 13(L)  ALT 0 - 44 U/L 7 12 6       RADIOGRAPHIC STUDIES: I have personally reviewed the radiological images as listed and agreed with the findings in the report. No results found.   ASSESSMENT & PLAN:  Mary Chapman is a 73 y.o. female with history of  1. Adenocarcinomainliver, likelyintrahepatic cholangiocarcinoma, cT2N1M0 stage IIIB, unresectable  -Diagnosed in 02/2018. She is not eligible for surgery due to tumor size and location. She was seen by Dr. Cloyd Stagers at [facility] -Currently on first line Cisplatin and Gemcitabine.  She tolerated first cycle well, with mild fatigue.  She had a moderate anemia after chemo, required blood transfusion.  No other significant side effects. -Labs reviewed, CBC showed Hg 9.2. CMP showed BUN 31 and Cr 5.40 Ca 8.2 AST 13 ALT 7 AKP 144.  Adequate for treatment, will proceed with chemo today -f/u in 2 weeks before cycle 3 -Plan to repeat a staging CT scan after 5 cycle chemo  2. HTN -Currently on metoprolol.  Her blood pressure has been high lately, I encouraged her to F/u with PCP soon  3. End Stage Renal Disease, on HD MWF -Treated with dialysis.  -f/u with nephrologist Dr. Augustin Coupe  4. Anemia of chronic disease  - Due to CKD and malignancy -Labs reviewed, CBC showed Hg 9.2. No blood transfusion today. -She received blood transfusion after first cycle chemo.  5.Goal of care discussion  -We again discussed the incurable nature of her cancer, and the overall poor prognosis, especially if she does not have good response to chemotherapy or progress on chemo -The patient understands the goal of care is palliative. -she is full code now   Plan  -labs reviewed, good to proceed with treatment with cycle 2 cisplatin and gemcitabine, and continue every 2 weeks -She is scheduled to f/u in 2  weeks. If she tolerate well. Will slightly increase her chemo dose on cycle 3   No problem-specific Assessment & Plan notes found for this encounter.   No orders of the defined types were placed in this encounter.  All questions were answered. The patient knows to call the clinic with any problems, questions or concerns. No barriers to learning was detected. I spent 20 minutes counseling the patient face to face. The total time spent in the appointment was 25 minutes and more than 50% was on counseling and review of test results  I, [facility]  Dweik am acting as scribe for Dr. Truitt Merle.  I have reviewed the above documentation for accuracy and completeness, and I agree with the above.     Truitt Merle, MD 06/01/2018

## 2018-05-27 NOTE — Progress Notes (Signed)
PATIENT CARE CENTER NOTE  Diagnosis: Anemia    Provider: Dr. Burr Medico   Procedure: 1 unit PRBC's    Note: Patient received 1 unit of blood. Tylenol 650 mg given pre-infusion per order. Tolerated infusion well. Blood pressure elevated at 181/89 pre-infusion. Dr. Ernestina Penna notified and order given for 12.5 mg Metoprolol PO. BP decreased to 178/86 after Metoprolol. Discharge instructions given. Patient stable, alert, oriented and ambulatory at discharge.

## 2018-05-28 LAB — TYPE AND SCREEN
ABO/RH(D): O POS
Antibody Screen: NEGATIVE
Unit division: 0

## 2018-05-28 LAB — BPAM RBC
Blood Product Expiration Date: 202002042359
ISSUE DATE / TIME: 202001090939
Unit Type and Rh: 5100

## 2018-05-30 ENCOUNTER — Other Ambulatory Visit: Payer: Self-pay | Admitting: Hematology

## 2018-05-30 DIAGNOSIS — C221 Intrahepatic bile duct carcinoma: Secondary | ICD-10-CM

## 2018-06-01 ENCOUNTER — Ambulatory Visit: Payer: Medicare Other | Admitting: Adult Health

## 2018-06-01 ENCOUNTER — Other Ambulatory Visit: Payer: Medicare Other

## 2018-06-01 ENCOUNTER — Inpatient Hospital Stay: Payer: Medicare Other

## 2018-06-01 ENCOUNTER — Encounter: Payer: Self-pay | Admitting: Hematology

## 2018-06-01 ENCOUNTER — Telehealth: Payer: Self-pay | Admitting: Hematology

## 2018-06-01 ENCOUNTER — Inpatient Hospital Stay (HOSPITAL_BASED_OUTPATIENT_CLINIC_OR_DEPARTMENT_OTHER): Payer: Medicare Other | Admitting: Hematology

## 2018-06-01 VITALS — BP 183/91 | HR 72 | Temp 98.6°F | Resp 18

## 2018-06-01 DIAGNOSIS — I700 Atherosclerosis of aorta: Secondary | ICD-10-CM

## 2018-06-01 DIAGNOSIS — Z79899 Other long term (current) drug therapy: Secondary | ICD-10-CM

## 2018-06-01 DIAGNOSIS — K6389 Other specified diseases of intestine: Secondary | ICD-10-CM

## 2018-06-01 DIAGNOSIS — N186 End stage renal disease: Secondary | ICD-10-CM

## 2018-06-01 DIAGNOSIS — D638 Anemia in other chronic diseases classified elsewhere: Secondary | ICD-10-CM

## 2018-06-01 DIAGNOSIS — Z5111 Encounter for antineoplastic chemotherapy: Secondary | ICD-10-CM | POA: Diagnosis not present

## 2018-06-01 DIAGNOSIS — C221 Intrahepatic bile duct carcinoma: Secondary | ICD-10-CM

## 2018-06-01 DIAGNOSIS — D490 Neoplasm of unspecified behavior of digestive system: Secondary | ICD-10-CM

## 2018-06-01 DIAGNOSIS — R918 Other nonspecific abnormal finding of lung field: Secondary | ICD-10-CM | POA: Diagnosis not present

## 2018-06-01 DIAGNOSIS — Z95828 Presence of other vascular implants and grafts: Secondary | ICD-10-CM

## 2018-06-01 DIAGNOSIS — I10 Essential (primary) hypertension: Secondary | ICD-10-CM

## 2018-06-01 DIAGNOSIS — Z992 Dependence on renal dialysis: Secondary | ICD-10-CM

## 2018-06-01 LAB — CMP (CANCER CENTER ONLY)
ALT: 7 U/L (ref 0–44)
ANION GAP: 11 (ref 5–15)
AST: 13 U/L — ABNORMAL LOW (ref 15–41)
Albumin: 2.5 g/dL — ABNORMAL LOW (ref 3.5–5.0)
Alkaline Phosphatase: 144 U/L — ABNORMAL HIGH (ref 38–126)
BUN: 31 mg/dL — ABNORMAL HIGH (ref 8–23)
CHLORIDE: 99 mmol/L (ref 98–111)
CO2: 31 mmol/L (ref 22–32)
Calcium: 8.2 mg/dL — ABNORMAL LOW (ref 8.9–10.3)
Creatinine: 5.4 mg/dL (ref 0.44–1.00)
GFR, EST AFRICAN AMERICAN: 8 mL/min — AB (ref >60)
GFR, Estimated: 7 mL/min — ABNORMAL LOW (ref >60)
Glucose, Bld: 96 mg/dL (ref 70–99)
Potassium: 3.8 mmol/L (ref 3.5–5.1)
Sodium: 141 mmol/L (ref 135–145)
Total Bilirubin: 0.3 mg/dL (ref 0.3–1.2)
Total Protein: 7.3 g/dL (ref 6.5–8.1)

## 2018-06-01 LAB — CBC WITH DIFFERENTIAL (CANCER CENTER ONLY)
Abs Immature Granulocytes: 0.03 10*3/uL (ref 0.00–0.07)
Basophils Absolute: 0 10*3/uL (ref 0.0–0.1)
Basophils Relative: 0 %
EOS ABS: 0.2 10*3/uL (ref 0.0–0.5)
Eosinophils Relative: 3 %
HCT: 28.7 % — ABNORMAL LOW (ref 36.0–46.0)
Hemoglobin: 9.2 g/dL — ABNORMAL LOW (ref 12.0–15.0)
Immature Granulocytes: 0 %
Lymphocytes Relative: 15 %
Lymphs Abs: 1.2 10*3/uL (ref 0.7–4.0)
MCH: 29.7 pg (ref 26.0–34.0)
MCHC: 32.1 g/dL (ref 30.0–36.0)
MCV: 92.6 fL (ref 80.0–100.0)
Monocytes Absolute: 0.7 10*3/uL (ref 0.1–1.0)
Monocytes Relative: 10 %
Neutro Abs: 5.3 10*3/uL (ref 1.7–7.7)
Neutrophils Relative %: 72 %
Platelet Count: 292 10*3/uL (ref 150–400)
RBC: 3.1 MIL/uL — ABNORMAL LOW (ref 3.87–5.11)
RDW: 22.5 % — ABNORMAL HIGH (ref 11.5–15.5)
WBC Count: 7.5 10*3/uL (ref 4.0–10.5)
nRBC: 0 % (ref 0.0–0.2)

## 2018-06-01 MED ORDER — PALONOSETRON HCL INJECTION 0.25 MG/5ML
INTRAVENOUS | Status: AC
  Filled 2018-06-01: qty 5

## 2018-06-01 MED ORDER — SODIUM CHLORIDE 0.9 % IV SOLN
Freq: Once | INTRAVENOUS | Status: AC
  Administered 2018-06-01: 12:00:00 via INTRAVENOUS
  Filled 2018-06-01: qty 5

## 2018-06-01 MED ORDER — PALONOSETRON HCL INJECTION 0.25 MG/5ML
0.2500 mg | Freq: Once | INTRAVENOUS | Status: AC
  Administered 2018-06-01: 0.25 mg via INTRAVENOUS

## 2018-06-01 MED ORDER — SODIUM CHLORIDE 0.9 % IV SOLN
12.5000 mg/m2 | Freq: Once | INTRAVENOUS | Status: AC
  Administered 2018-06-01: 21 mg via INTRAVENOUS
  Filled 2018-06-01: qty 21

## 2018-06-01 MED ORDER — SODIUM CHLORIDE 0.9 % IV SOLN
Freq: Once | INTRAVENOUS | Status: AC
  Administered 2018-06-01: 12:00:00 via INTRAVENOUS
  Filled 2018-06-01: qty 250

## 2018-06-01 MED ORDER — HEPARIN SOD (PORK) LOCK FLUSH 100 UNIT/ML IV SOLN
500.0000 [IU] | Freq: Once | INTRAVENOUS | Status: AC | PRN
  Administered 2018-06-01: 500 [IU]
  Filled 2018-06-01: qty 5

## 2018-06-01 MED ORDER — SODIUM CHLORIDE 0.9 % IV SOLN
Freq: Once | INTRAVENOUS | Status: DC
  Filled 2018-06-01: qty 250

## 2018-06-01 MED ORDER — SODIUM CHLORIDE 0.9% FLUSH
10.0000 mL | INTRAVENOUS | Status: DC | PRN
  Administered 2018-06-01: 10 mL
  Filled 2018-06-01: qty 10

## 2018-06-01 MED ORDER — SODIUM CHLORIDE 0.9 % IV SOLN
500.0000 mg/m2 | Freq: Once | INTRAVENOUS | Status: AC
  Administered 2018-06-01: 836 mg via INTRAVENOUS
  Filled 2018-06-01: qty 21.99

## 2018-06-01 NOTE — Progress Notes (Signed)
Per Dr. Burr Medico, okay to treat with crt of 5.40 and BP of 183/91.  Dr Burr Medico will see pt in infusion later today

## 2018-06-01 NOTE — Telephone Encounter (Signed)
Patient stop by scheduling after treatment to see if the doc had any notes.  Appointments were already scheduled and no los per 1/14.

## 2018-06-01 NOTE — Patient Instructions (Signed)
[facility] Discharge Instructions for Patients Receiving Chemotherapy  Today you received the following chemotherapy agents Gemzar and Cisplatin  To help prevent nausea and vomiting after your treatment, we encourage you to take your nausea medication as directed   If you develop nausea and vomiting that is not controlled by your nausea medication, call the clinic.   BELOW ARE SYMPTOMS THAT SHOULD BE REPORTED IMMEDIATELY:  *FEVER GREATER THAN 100.5 F  *CHILLS WITH OR WITHOUT FEVER  NAUSEA AND VOMITING THAT IS NOT CONTROLLED WITH YOUR NAUSEA MEDICATION  *UNUSUAL SHORTNESS OF BREATH  *UNUSUAL BRUISING OR BLEEDING  TENDERNESS IN MOUTH AND THROAT WITH OR WITHOUT PRESENCE OF ULCERS  *URINARY PROBLEMS  *BOWEL PROBLEMS  UNUSUAL RASH Items with * indicate a potential emergency and should be followed up as soon as possible.  Feel free to call the clinic should you have any questions or concerns. The clinic phone number is [phone].  Please show the CHEMO ALERT CARD at check-in to the Emergency Department and triage nurse.   

## 2018-06-02 LAB — CANCER ANTIGEN 19-9: CA 19-9: 47 U/mL — ABNORMAL HIGH (ref 0–35)

## 2018-06-09 ENCOUNTER — Other Ambulatory Visit: Payer: Self-pay | Admitting: Hematology

## 2018-06-11 ENCOUNTER — Other Ambulatory Visit: Payer: Self-pay | Admitting: Hematology

## 2018-06-11 ENCOUNTER — Telehealth: Payer: Self-pay

## 2018-06-11 MED ORDER — OXYCODONE HCL 5 MG PO TABS: 5.0000 mg | ORAL_TABLET | Freq: Four times a day (QID) | ORAL | 0 refills | Status: DC | PRN

## 2018-06-11 NOTE — Progress Notes (Signed)
[place]   [phone] [phone]   Clinic Follow up Note   Patient Care Team: Glendon Axe, MD as PCP - General (Family Medicine) [name], MD as PCP - Cardiology (Cardiology) Dwana Melena, MD as Attending Physician (Nephrology) Truitt Merle, MD as Consulting Physician (Hematology)  Date of Service:  06/14/2018  CHIEF COMPLAINT: F/u on liver adenocarcinoma  SUMMARY OF ONCOLOGIC HISTORY:   Intrahepatic cholangiocarcinoma ([place])   02/23/2018 Imaging    CT AP W Contrast 02/23/18  IMPRESSION: 1. Large multi-cystic lesion in the central aspect of the liver adjacent to the gallbladder fossa, with several smaller satellite lesions. These are all new compared to prior CT the chest, abdomen and pelvis 08/07/2016, concerning for intrahepatic abscesses. 2. Extensive mural thickening and inflammatory changes in the region of the cecum. This is nonspecific, and could reflect either right-sided diverticulitis, focal area of colitis, or potentially even underlying neoplasm. 3. Cholelithiasis. Gallbladder is nearly completely contracted without surrounding inflammatory changes to suggest an acute cholecystitis at this time. 4. Left-sided nephrolithiasis measuring up to 8 mm in the upper pole collecting system of left kidney. No ureteral stones or findings of urinary tract obstruction. 5. Aortic atherosclerosis.    02/27/2018 Initial Biopsy    Diagnosis 02/27/18  Liver, needle/core biopsy, Right Hepatic Lobe - ADENOCARCINOMA. SEE NOTE.    02/27/2018 Miscellaneous       03/05/2018 Procedure    Colonoscopy 03/05/18  IMPRESSION - Preparation of the colon was fair. - Non-thrombosed external hemorrhoids found on digital rectal exam. - There was significant looping of the colon. - Severe diverticulosis in the recto-sigmoid colon, in the sigmoid colon, in the descending colon, in the transverse colon, at the hepatic flexure, in the ascending colon  and in the cecum. There was no evidence of diverticular bleeding. - A single (solitary) ulcer in the cecum - highly concerning for underlying malignancy. Biopsied. Phlegmonous change is possible, though often in setting of complicated diverticulosis/diverticulitis ulceration would not be as common. - Erythematous mucosa in the transverse colon, at the hepatic flexure, in the ascending colon and in the cecum. Biopsied. - Normal mucosa in the rectum, in the recto-sigmoid colon, in the sigmoid colon and in the descending colon. Biopsied. - Non-bleeding non-thrombosed external and internal hemorrhoids. -----Negative for malignancy     03/11/2018 Initial Diagnosis    Intrahepatic cholangiocarcinoma ([place])    04/02/2018 Imaging    CT CAP W contrast 04/02/18  IMPRESSION: 1. 4 mm subpleural nodule in the periphery of the right lower lobe. This is nonspecific, but strongly favored to represent a benign subpleural lymph node. Attention on follow-up studies is recommended to exclude the possibility of metastatic disease. 2. Diffuse bronchial wall thickening with moderate centrilobular and mild paraseptal emphysema; imaging findings suggestive of underlying COPD. 3. Aortic atherosclerosis, in addition to left main and left anterior descending coronary artery disease. Assessment for potential risk factor modification, dietary therapy or pharmacologic therapy may be warranted, if clinically indicated. 4. Nonobstructive calculi in the upper pole collecting system of left kidney measuring up to 7 mm.     04/27/2018 Cancer Staging    Staging form: Intrahepatic Bile Duct, AJCC 8th Edition - Clinical: Stage IIIB (cT2, cN1, cM0) - Signed by Truitt Merle, MD on 04/27/2018    05/18/2018 -  Chemotherapy    Gemcitabine and Cisplatin every 2 weeksstarting on 05/18/2018, with 50% dose reduction      CURRENT THERAPY:  Gemcitabine and Cisplatin every 2 weeksstarting on  05/18/2018, with 50% dose  reduction  INTERVAL HISTORY:  Mary Chapman is here for a follow up. She presents to the clinic today with her family member. She notes she has been having intense right lower quadrant pain She has been taking ibuprofen and oxycodone 5mg  twice q6hours. This has not been relieving her pain much. She has not been taking oxycodone on days of dialysis. Her daughters wonder can she increase her ibuprofen on those days. She notes her BP is elevated lately. She is on metoprolol. She has been experiencing headaches from this.  She notes she has been tolerating chemo moderately well with fatigue She denies nausea. She denies any black or blood in stool.     REVIEW OF SYSTEMS:   Constitutional: Denies fevers, chills or abnormal weight loss (+) fatigue (+) headaches  Eyes: Denies blurriness of vision Ears, nose, mouth, throat, and face: Denies mucositis or sore throat Respiratory: Denies cough, dyspnea or wheezes Cardiovascular: Denies palpitation, chest discomfort or lower extremity swelling Gastrointestinal:  Denies nausea, heartburn or change in bowel habits Skin: Denies abnormal skin rashes Lymphatics: Denies new lymphadenopathy or easy bruising Neurological:Denies numbness, tingling or new weaknesses Behavioral/Psych: Mood is stable, no new changes  All other systems were reviewed with the patient and are negative.  MEDICAL HISTORY:  Past Medical History:  Diagnosis Date  . Diverticulitis   . Focal segmental glomerulosclerosis    ESRD on MWF HD  . Hypertension   . Tobacco abuse     SURGICAL HISTORY: Past Surgical History:  Procedure Laterality Date  . AV FISTULA PLACEMENT Left 03/18/2017   Procedure: Brachiocephalic ARTERIOVENOUS (AV) FISTULA CREATION left arm;  Surgeon: Elam Dutch, MD;  Location: [place];  Service: Vascular;  Laterality: Left;  . BIOPSY  03/05/2018   Procedure: BIOPSY;  Surgeon: Rush Landmark Telford Nab., MD;  Location: [place];  Service: Gastroenterology;;   enteroscopy bx /cytology brushing Greig Castilla bx  . COLONOSCOPY WITH PROPOFOL N/A 03/05/2018   Procedure: COLONOSCOPY WITH PROPOFOL;  Surgeon: Rush Landmark Telford Nab., MD;  Location: [place];  Service: Gastroenterology;  Laterality: N/A;  Bx, Spot  . ENTEROSCOPY N/A 03/05/2018   Procedure: ENTEROSCOPY;  Surgeon: Rush Landmark Telford Nab., MD;  Location: [place];  Service: Gastroenterology;  Laterality: N/A;  BX, Brushing, & Spot  . INSERTION OF DIALYSIS CATHETER N/A 03/18/2017   Procedure: INSERTION OF DIALYSIS CATHETER;  Surgeon: Elam Dutch, MD;  Location: [place];  Service: Vascular;  Laterality: N/A;  . IR GENERIC HISTORICAL  08/15/2016   IR US GUIDE VASC ACCESS RIGHT 08/15/2016 Arne Cleveland, MD [facility]  . IR GENERIC HISTORICAL  08/15/2016   IR FLUORO GUIDE CV LINE RIGHT 08/15/2016 Arne Cleveland, MD [facility]  . IR IMAGING GUIDED PORT INSERTION  05/06/2018  . SUBMUCOSAL INJECTION  03/05/2018   Procedure: SUBMUCOSAL INJECTION;  Surgeon: Rush Landmark Telford Nab., MD;  Location: [place];  Service: Gastroenterology;;  spot tatoo  . TUBAL LIGATION      I have reviewed the social history and family history with the patient and they are unchanged from previous note.  ALLERGIES:  has No Known Allergies.  MEDICATIONS:  Current Outpatient Medications  Medication Sig Dispense Refill  . folic acid (FOLVITE) 1 MG tablet Take 1 tablet (1 mg total) by mouth daily. 30 tablet 0  . lidocaine-prilocaine (EMLA) cream Apply to affected area once 30 g 3  . metoprolol tartrate (LOPRESSOR) 25 MG tablet Take 0.5 tablets (12.5 mg total) by mouth 2 (two) times daily. [place]  tablet 0  . multivitamin (RENA-VIT) TABS tablet Take 1 tablet by mouth at bedtime. 30 tablet 3  . nicotine (NICODERM CQ - DOSED IN MG/24 HOURS) 21 mg/24hr patch Place 1 patch (21 mg total) onto the skin daily. 28 patch 0  . Nutritional Supplements (FEEDING SUPPLEMENT, NEPRO CARB STEADY,) LIQD Take 237 mLs by mouth 3 (three)  times daily as needed (Supplement). 90 Can 0  . ondansetron (ZOFRAN) 8 MG tablet Take 1 tablet (8 mg total) by mouth 2 (two) times daily as needed. Start on the third day after chemotherapy. 30 tablet 1  . oxyCODONE (OXY IR/ROXICODONE) 5 MG immediate release tablet Take 1 tablet (5 mg total) by mouth every 6 (six) hours as needed for severe pain. 30 tablet 0  . oxyCODONE-acetaminophen (PERCOCET) 5-325 MG tablet Take 1 tablet by mouth every 6 (six) hours as needed for severe pain. 20 tablet 0  . pantoprazole (PROTONIX) 40 MG tablet TAKE 1 TABLET BY MOUTH TWICE A DAY 60 tablet 1  . polyethylene glycol (MIRALAX) packet Take 17 g by mouth daily. 30 each 0  . prochlorperazine (COMPAZINE) 10 MG tablet Take 1 tablet (10 mg total) by mouth every 6 (six) hours as needed (Nausea or vomiting). 30 tablet 1  . senna-docusate (SENOKOT-S) 8.6-50 MG tablet Take 2 tablets by mouth 2 (two) times daily. 30 tablet 0  . Vitamin D, Ergocalciferol, (DRISDOL) 50000 units CAPS capsule Take 50,000 Units by mouth once a week.  5   No current facility-administered medications for this visit.     PHYSICAL EXAMINATION: ECOG PERFORMANCE STATUS: 2 - Symptomatic, <50% confined to bed  Vitals:   06/14/18 1340  BP: (!) 174/91  Pulse: 84  Resp: 18  Temp: 99.4 F (37.4 C)  SpO2: 100%   Filed Weights   06/14/18 1340  Weight: 134 lb 12.8 oz (61.1 kg)    GENERAL:alert, no distress and comfortable SKIN: skin color, texture, turgor are normal, no rashes or significant lesions EYES: normal, Conjunctiva are pink and non-injected, sclera clear OROPHARYNX:no exudate, no erythema and lips, buccal mucosa, and tongue normal  NECK: supple, thyroid normal size, non-tender, without nodularity LYMPH:  no palpable lymphadenopathy in the cervical, axillary or inguinal LUNGS: clear to auscultation and percussion with normal breathing effort HEART: regular rate & rhythm and no murmurs and no lower extremity edema ABDOMEN:abdomen soft,  non-tender and normal bowel sounds (+) hepatomegaly  Musculoskeletal:no cyanosis of digits and no clubbing  NEURO: alert & oriented x 3 with fluent speech, no focal motor/sensory deficits  LABORATORY DATA:  I have reviewed the data as listed CBC Latest Ref Rng & Units 06/14/2018 06/01/2018 05/25/2018  WBC 4.0 - 10.5 K/uL 8.5 7.5 2.9(L)  Hemoglobin 12.0 - 15.0 g/dL 8.8(L) 9.2(L) 7.9(L)  Hematocrit 36.0 - 46.0 % 27.6(L) 28.7(L) 25.8(L)  Platelets 150 - 400 K/uL 198 292 212     CMP Latest Ref Rng & Units 06/14/2018 06/01/2018 05/25/2018  Glucose 70 - 99 mg/dL 127(H) 96 88  BUN 8 - 23 mg/dL 16 31(H) 28(H)  Creatinine 0.44 - 1.00 mg/dL 3.91(HH) 5.40(HH) 5.11(HH)  Sodium 135 - 145 mmol/L 139 141 142  Potassium 3.5 - 5.1 mmol/L 3.7 3.8 4.2  Chloride 98 - 111 mmol/L 99 99 103  CO2 22 - 32 mmol/L 30 31 28   Calcium 8.9 - 10.3 mg/dL 8.3(L) 8.2(L) 8.1(L)  Total Protein 6.5 - 8.1 g/dL 7.4 7.3 7.3  Total Bilirubin 0.3 - 1.2 mg/dL 0.3 0.3 0.3  Alkaline Phos 38 -  126 U/L 147(H) 144(H) 145(H)  AST 15 - 41 U/L 14(L) 13(L) 15  ALT 0 - 44 U/L 9 7 12     02/28/28    RADIOGRAPHIC STUDIES: I have personally reviewed the radiological images as listed and agreed with the findings in the report. No results found.   ASSESSMENT & PLAN:  Mary Chapman is a 73 y.o. female with   1. Adenocarcinomainliver, likelyintrahepatic cholangiocarcinoma, cT2N1M0 stage IIIB, unresectable, MS-Stable  -Diagnosed in 02/2018. She is not eligible for surgery due to tumor size and location. She was seen by Dr. Cloyd Stagers at [facility] -Currently on first line Cisplatin and Gemcitabine every 2 weeks since 04/2018. She tolerated first cycle well, with mild fatigue. She had a moderate anemia after chemo, required blood transfusion.  No other significant side effects. -Plan to repeat a staging CT scan after 4 cycles chemo.  -She does have hepatomegaly on exam today and reports slightly worse RUQ pain  -Her Abdominal pain is not controlled on  low dose oxycodone and ibuprofen. I recommend she increase her oxycodone 4-6 times a day.  -Labs revewied, CBC WNL except anemia with Hg of 8.8. CMP is still pending. She will proceed with cycle 3 Gem/Cis tomorrow.  -F/u in 2 weeks    2. HTN -Currently on metoprolol.  -Her BP has been elevated lately, at 174/91 today (06/14/18) with headaches. I encouraged her to F/u with PCP soon to add more medication  3. End Stage Renal Disease, on HD MWF -Treated with dialysis.  -f/u with nephrologist Dr. Augustin Coupe  4. Anemia of chronic disease  - Due to CKD and malignancy -She received blood transfusion after first cycle chemo. -Labs reviewed, CBC showed Hg at 8.8 (06/14/18). No blood transfusion today.  5.Goal of care discussion  -The patient understands the goal of care is palliative, to prolong his life  -she is full code now   Plan  -labs reviewed, good to proceed with treatment with cycle 3 cisplatin and gemcitabine tomorrow -she will increase oxycodone for her abdominal pain  -lab, flush, f/u and chemo in 2 weeks    No problem-specific Assessment & Plan notes found for this encounter.   No orders of the defined types were placed in this encounter.  All questions were answered. The patient knows to call the clinic with any problems, questions or concerns. No barriers to learning was detected. I spent 20 minutes counseling the patient face to face. The total time spent in the appointment was 25 minutes and more than 50% was on counseling and review of test results     Truitt Merle, MD 06/14/2018   I, Joslyn Devon, am acting as scribe for Truitt Merle, MD.   I have reviewed the above documentation for accuracy and completeness, and I agree with the above.

## 2018-06-11 NOTE — Telephone Encounter (Signed)
Received call that patient needs refill on Oxycodone sent into her pharmacy on file. Routed to Dr. Burr Medico.

## 2018-06-14 ENCOUNTER — Inpatient Hospital Stay: Payer: Medicare Other

## 2018-06-14 ENCOUNTER — Inpatient Hospital Stay (HOSPITAL_BASED_OUTPATIENT_CLINIC_OR_DEPARTMENT_OTHER): Payer: Medicare Other | Admitting: Hematology

## 2018-06-14 VITALS — BP 174/91 | HR 84 | Temp 99.4°F | Resp 18 | Ht 66.0 in | Wt 134.8 lb

## 2018-06-14 DIAGNOSIS — D490 Neoplasm of unspecified behavior of digestive system: Secondary | ICD-10-CM

## 2018-06-14 DIAGNOSIS — C221 Intrahepatic bile duct carcinoma: Secondary | ICD-10-CM | POA: Diagnosis not present

## 2018-06-14 DIAGNOSIS — Z79899 Other long term (current) drug therapy: Secondary | ICD-10-CM

## 2018-06-14 DIAGNOSIS — D638 Anemia in other chronic diseases classified elsewhere: Secondary | ICD-10-CM | POA: Diagnosis not present

## 2018-06-14 DIAGNOSIS — K6389 Other specified diseases of intestine: Secondary | ICD-10-CM

## 2018-06-14 DIAGNOSIS — I700 Atherosclerosis of aorta: Secondary | ICD-10-CM

## 2018-06-14 DIAGNOSIS — Z992 Dependence on renal dialysis: Secondary | ICD-10-CM

## 2018-06-14 DIAGNOSIS — Z5111 Encounter for antineoplastic chemotherapy: Secondary | ICD-10-CM | POA: Diagnosis not present

## 2018-06-14 DIAGNOSIS — G893 Neoplasm related pain (acute) (chronic): Secondary | ICD-10-CM | POA: Diagnosis not present

## 2018-06-14 DIAGNOSIS — Z95828 Presence of other vascular implants and grafts: Secondary | ICD-10-CM

## 2018-06-14 DIAGNOSIS — N186 End stage renal disease: Secondary | ICD-10-CM

## 2018-06-14 DIAGNOSIS — R918 Other nonspecific abnormal finding of lung field: Secondary | ICD-10-CM | POA: Diagnosis not present

## 2018-06-14 DIAGNOSIS — I10 Essential (primary) hypertension: Secondary | ICD-10-CM

## 2018-06-14 LAB — CMP (CANCER CENTER ONLY)
ALT: 9 U/L (ref 0–44)
AST: 14 U/L — ABNORMAL LOW (ref 15–41)
Albumin: 2.7 g/dL — ABNORMAL LOW (ref 3.5–5.0)
Alkaline Phosphatase: 147 U/L — ABNORMAL HIGH (ref 38–126)
Anion gap: 10 (ref 5–15)
BUN: 16 mg/dL (ref 8–23)
CHLORIDE: 99 mmol/L (ref 98–111)
CO2: 30 mmol/L (ref 22–32)
Calcium: 8.3 mg/dL — ABNORMAL LOW (ref 8.9–10.3)
Creatinine: 3.91 mg/dL (ref 0.44–1.00)
GFR, Est AFR Am: 13 mL/min — ABNORMAL LOW (ref >60)
GFR, Estimated: 11 mL/min — ABNORMAL LOW (ref >60)
Glucose, Bld: 127 mg/dL — ABNORMAL HIGH (ref 70–99)
Potassium: 3.7 mmol/L (ref 3.5–5.1)
Sodium: 139 mmol/L (ref 135–145)
Total Bilirubin: 0.3 mg/dL (ref 0.3–1.2)
Total Protein: 7.4 g/dL (ref 6.5–8.1)

## 2018-06-14 LAB — CBC WITH DIFFERENTIAL (CANCER CENTER ONLY)
ABS IMMATURE GRANULOCYTES: 0.04 10*3/uL (ref 0.00–0.07)
Basophils Absolute: 0 10*3/uL (ref 0.0–0.1)
Basophils Relative: 0 %
Eosinophils Absolute: 0.1 10*3/uL (ref 0.0–0.5)
Eosinophils Relative: 1 %
HCT: 27.6 % — ABNORMAL LOW (ref 36.0–46.0)
HEMOGLOBIN: 8.8 g/dL — AB (ref 12.0–15.0)
Immature Granulocytes: 1 %
Lymphocytes Relative: 16 %
Lymphs Abs: 1.3 10*3/uL (ref 0.7–4.0)
MCH: 29.5 pg (ref 26.0–34.0)
MCHC: 31.9 g/dL (ref 30.0–36.0)
MCV: 92.6 fL (ref 80.0–100.0)
Monocytes Absolute: 0.8 10*3/uL (ref 0.1–1.0)
Monocytes Relative: 9 %
NEUTROS ABS: 6.2 10*3/uL (ref 1.7–7.7)
NEUTROS PCT: 73 %
Platelet Count: 198 10*3/uL (ref 150–400)
RBC: 2.98 MIL/uL — ABNORMAL LOW (ref 3.87–5.11)
RDW: 21.6 % — ABNORMAL HIGH (ref 11.5–15.5)
WBC Count: 8.5 10*3/uL (ref 4.0–10.5)
nRBC: 0 % (ref 0.0–0.2)

## 2018-06-14 MED ORDER — HEPARIN SOD (PORK) LOCK FLUSH 100 UNIT/ML IV SOLN
500.0000 [IU] | Freq: Once | INTRAVENOUS | Status: AC | PRN
  Administered 2018-06-14: 500 [IU]
  Filled 2018-06-14: qty 5

## 2018-06-14 MED ORDER — SODIUM CHLORIDE 0.9% FLUSH
10.0000 mL | INTRAVENOUS | Status: DC | PRN
  Administered 2018-06-14: 10 mL
  Filled 2018-06-14: qty 10

## 2018-06-15 ENCOUNTER — Telehealth: Payer: Self-pay | Admitting: Hematology

## 2018-06-15 ENCOUNTER — Encounter: Payer: Self-pay | Admitting: Hematology

## 2018-06-15 ENCOUNTER — Inpatient Hospital Stay: Payer: Medicare Other

## 2018-06-15 VITALS — BP 162/89 | HR 72 | Temp 98.7°F | Resp 18

## 2018-06-15 DIAGNOSIS — C221 Intrahepatic bile duct carcinoma: Secondary | ICD-10-CM

## 2018-06-15 DIAGNOSIS — Z5111 Encounter for antineoplastic chemotherapy: Secondary | ICD-10-CM | POA: Diagnosis not present

## 2018-06-15 MED ORDER — SODIUM CHLORIDE 0.9 % IV SOLN
Freq: Once | INTRAVENOUS | Status: AC
  Administered 2018-06-15: 13:00:00 via INTRAVENOUS
  Filled 2018-06-15: qty 5

## 2018-06-15 MED ORDER — SODIUM CHLORIDE 0.9 % IV SOLN
Freq: Once | INTRAVENOUS | Status: AC
  Administered 2018-06-15: 13:00:00 via INTRAVENOUS
  Filled 2018-06-15: qty 250

## 2018-06-15 MED ORDER — SODIUM CHLORIDE 0.9 % IV SOLN
12.5000 mg/m2 | Freq: Once | INTRAVENOUS | Status: AC
  Administered 2018-06-15: 21 mg via INTRAVENOUS
  Filled 2018-06-15: qty 21

## 2018-06-15 MED ORDER — HEPARIN SOD (PORK) LOCK FLUSH 100 UNIT/ML IV SOLN
500.0000 [IU] | Freq: Once | INTRAVENOUS | Status: AC | PRN
  Administered 2018-06-15: 500 [IU]
  Filled 2018-06-15: qty 5

## 2018-06-15 MED ORDER — SODIUM CHLORIDE 0.9 % IV SOLN
500.0000 mg/m2 | Freq: Once | INTRAVENOUS | Status: AC
  Administered 2018-06-15: 836 mg via INTRAVENOUS
  Filled 2018-06-15: qty 21.99

## 2018-06-15 MED ORDER — SODIUM CHLORIDE 0.9% FLUSH
10.0000 mL | INTRAVENOUS | Status: DC | PRN
  Administered 2018-06-15: 10 mL
  Filled 2018-06-15: qty 10

## 2018-06-15 MED ORDER — PALONOSETRON HCL INJECTION 0.25 MG/5ML
0.2500 mg | Freq: Once | INTRAVENOUS | Status: AC
  Administered 2018-06-15: 0.25 mg via INTRAVENOUS

## 2018-06-15 MED ORDER — PALONOSETRON HCL INJECTION 0.25 MG/5ML
INTRAVENOUS | Status: AC
  Filled 2018-06-15: qty 5

## 2018-06-15 NOTE — Patient Instructions (Signed)
[facility] Discharge Instructions for Patients Receiving Chemotherapy  Today you received the following chemotherapy agents Gemzar and Cisplatin  To help prevent nausea and vomiting after your treatment, we encourage you to take your nausea medication as directed   If you develop nausea and vomiting that is not controlled by your nausea medication, call the clinic.   BELOW ARE SYMPTOMS THAT SHOULD BE REPORTED IMMEDIATELY:  *FEVER GREATER THAN 100.5 F  *CHILLS WITH OR WITHOUT FEVER  NAUSEA AND VOMITING THAT IS NOT CONTROLLED WITH YOUR NAUSEA MEDICATION  *UNUSUAL SHORTNESS OF BREATH  *UNUSUAL BRUISING OR BLEEDING  TENDERNESS IN MOUTH AND THROAT WITH OR WITHOUT PRESENCE OF ULCERS  *URINARY PROBLEMS  *BOWEL PROBLEMS  UNUSUAL RASH Items with * indicate a potential emergency and should be followed up as soon as possible.  Feel free to call the clinic should you have any questions or concerns. The clinic phone number is [phone].  Please show the CHEMO ALERT CARD at check-in to the Emergency Department and triage nurse.   

## 2018-06-15 NOTE — Telephone Encounter (Signed)
No los per 01/27.

## 2018-06-15 NOTE — Progress Notes (Signed)
Patient came in to bring proof of income for one-time $700 [place].  Patient approved for one-time grant. She has a copy of the approval letter as well as the expense sheet along with the Outpatient pharmacy information.  She has my card for any additional financial questions or concerns.

## 2018-06-25 NOTE — Progress Notes (Signed)
[place]   [phone] Fax:([phone]) [phone]   Clinic Follow up Note   Patient Care Team: Glendon Axe, MD as PCP - General (Family Medicine) [name], MD as PCP - Cardiology (Cardiology) Dwana Melena, MD as Attending Physician (Nephrology) Truitt Merle, MD as Consulting Physician (Hematology)  Date of Service:  06/28/2018  CHIEF COMPLAINT: F/u on intrahepatic cholangiocarcinoma  SUMMARY OF ONCOLOGIC HISTORY:   Intrahepatic cholangiocarcinoma ([place])   02/23/2018 Imaging    CT AP W Contrast 02/23/18  IMPRESSION: 1. Large multi-cystic lesion in the central aspect of the liver adjacent to the gallbladder fossa, with several smaller satellite lesions. These are all new compared to prior CT the chest, abdomen and pelvis 08/07/2016, concerning for intrahepatic abscesses. 2. Extensive mural thickening and inflammatory changes in the region of the cecum. This is nonspecific, and could reflect either right-sided diverticulitis, focal area of colitis, or potentially even underlying neoplasm. 3. Cholelithiasis. Gallbladder is nearly completely contracted without surrounding inflammatory changes to suggest an acute cholecystitis at this time. 4. Left-sided nephrolithiasis measuring up to 8 mm in the upper pole collecting system of left kidney. No ureteral stones or findings of urinary tract obstruction. 5. Aortic atherosclerosis.    02/27/2018 Initial Biopsy    Diagnosis 02/27/18  Liver, needle/core biopsy, Right Hepatic Lobe - ADENOCARCINOMA. SEE NOTE.    02/27/2018 Miscellaneous       03/05/2018 Procedure    Colonoscopy 03/05/18  IMPRESSION - Preparation of the colon was fair. - Non-thrombosed external hemorrhoids found on digital rectal exam. - There was significant looping of the colon. - Severe diverticulosis in the recto-sigmoid colon, in the sigmoid colon, in the descending colon, in the transverse colon, at the hepatic flexure, in the  ascending colon and in the cecum. There was no evidence of diverticular bleeding. - A single (solitary) ulcer in the cecum - highly concerning for underlying malignancy. Biopsied. Phlegmonous change is possible, though often in setting of complicated diverticulosis/diverticulitis ulceration would not be as common. - Erythematous mucosa in the transverse colon, at the hepatic flexure, in the ascending colon and in the cecum. Biopsied. - Normal mucosa in the rectum, in the recto-sigmoid colon, in the sigmoid colon and in the descending colon. Biopsied. - Non-bleeding non-thrombosed external and internal hemorrhoids. -----Negative for malignancy     03/11/2018 Initial Diagnosis    Intrahepatic cholangiocarcinoma ([place])    04/02/2018 Imaging    CT CAP W contrast 04/02/18  IMPRESSION: 1. 4 mm subpleural nodule in the periphery of the right lower lobe. This is nonspecific, but strongly favored to represent a benign subpleural lymph node. Attention on follow-up studies is recommended to exclude the possibility of metastatic disease. 2. Diffuse bronchial wall thickening with moderate centrilobular and mild paraseptal emphysema; imaging findings suggestive of underlying COPD. 3. Aortic atherosclerosis, in addition to left main and left anterior descending coronary artery disease. Assessment for potential risk factor modification, dietary therapy or pharmacologic therapy may be warranted, if clinically indicated. 4. Nonobstructive calculi in the upper pole collecting system of left kidney measuring up to 7 mm.     04/27/2018 Cancer Staging    Staging form: Intrahepatic Bile Duct, AJCC 8th Edition - Clinical: Stage IIIB (cT2, cN1, cM0) - Signed by Truitt Merle, MD on 04/27/2018    05/18/2018 -  Chemotherapy    Gemcitabine and Cisplatin every 2 weeksstarting on 05/18/2018, with 50% dose reduction      CURRENT THERAPY:  Gemcitabine and Cisplatin every 2 weeksstarting on  05/18/2018, with  50% dose reduction  INTERVAL HISTORY:  Mary Chapman is here for a follow up of treatment. She presents to the clinic today with her daughter. She notes she is doing well today. She notes due to pain her days are good or bad. She takes Oxycodone only as needed above 5/10. She notes some days she is aware pain is there but no bad. She notes her appetite is improving but not gaining much weight. She notes she still takes care of herself and takes herself well. She notes the constipation she previously had is resolved with Miralax.     REVIEW OF SYSTEMS:   Constitutional: Denies fevers, chills or abnormal weight loss (+) improved appetite  Eyes: Denies blurriness of vision Ears, nose, mouth, throat, and face: Denies mucositis or sore throat Respiratory: Denies cough, dyspnea or wheezes Cardiovascular: Denies palpitation, chest discomfort or lower extremity swelling Gastrointestinal:  Denies nausea, heartburn (+) Abdominal pain, today at 3/10 Skin: Denies abnormal skin rashes Lymphatics: Denies new lymphadenopathy or easy bruising Neurological:Denies numbness, tingling or new weaknesses Behavioral/Psych: Mood is stable, no new changes  All other systems were reviewed with the patient and are negative.  MEDICAL HISTORY:  Past Medical History:  Diagnosis Date  . Diverticulitis   . Focal segmental glomerulosclerosis    ESRD on MWF HD  . Hypertension   . Tobacco abuse     SURGICAL HISTORY: Past Surgical History:  Procedure Laterality Date  . AV FISTULA PLACEMENT Left 03/18/2017   Procedure: Brachiocephalic ARTERIOVENOUS (AV) FISTULA CREATION left arm;  Surgeon: Elam Dutch, MD;  Location: [facility];  Service: Vascular;  Laterality: Left;  . BIOPSY  03/05/2018   Procedure: BIOPSY;  Surgeon: Rush Landmark Telford Nab., MD;  Location: [facility];  Service: Gastroenterology;;  enteroscopy bx /cytology brushing Greig Castilla bx  . COLONOSCOPY WITH PROPOFOL N/A 03/05/2018   Procedure: COLONOSCOPY  WITH PROPOFOL;  Surgeon: Rush Landmark Telford Nab., MD;  Location: [facility];  Service: Gastroenterology;  Laterality: N/A;  Bx, Spot  . ENTEROSCOPY N/A 03/05/2018   Procedure: ENTEROSCOPY;  Surgeon: Rush Landmark Telford Nab., MD;  Location: [facility];  Service: Gastroenterology;  Laterality: N/A;  BX, Brushing, & Spot  . INSERTION OF DIALYSIS CATHETER N/A 03/18/2017   Procedure: INSERTION OF DIALYSIS CATHETER;  Surgeon: Elam Dutch, MD;  Location: [facility];  Service: Vascular;  Laterality: N/A;  . IR GENERIC HISTORICAL  08/15/2016   IR US GUIDE VASC ACCESS RIGHT 08/15/2016 Arne Cleveland, MD [facility]  . IR GENERIC HISTORICAL  08/15/2016   IR FLUORO GUIDE CV LINE RIGHT 08/15/2016 Arne Cleveland, MD [facility]  . IR IMAGING GUIDED PORT INSERTION  05/06/2018  . SUBMUCOSAL INJECTION  03/05/2018   Procedure: SUBMUCOSAL INJECTION;  Surgeon: Rush Landmark Telford Nab., MD;  Location: [facility];  Service: Gastroenterology;;  spot tatoo  . TUBAL LIGATION      I have reviewed the social history and family history with the patient and they are unchanged from previous note.  ALLERGIES:  has No Known Allergies.  MEDICATIONS:  Current Outpatient Medications  Medication Sig Dispense Refill  . folic acid (FOLVITE) 1 MG tablet Take 1 tablet (1 mg total) by mouth daily. 30 tablet 0  . lidocaine-prilocaine (EMLA) cream Apply to affected area once 30 g 3  . metoprolol tartrate (LOPRESSOR) 25 MG tablet Take 0.5 tablets (12.5 mg total) by mouth 2 (two) times daily. 30 tablet 0  . multivitamin (RENA-VIT) TABS tablet Take 1 tablet by mouth at bedtime. 30 tablet  3  . nicotine (NICODERM CQ - DOSED IN MG/24 HOURS) 21 mg/24hr patch Place 1 patch (21 mg total) onto the skin daily. 28 patch 0  . Nutritional Supplements (FEEDING SUPPLEMENT, NEPRO CARB STEADY,) LIQD Take 237 mLs by mouth 3 (three) times daily as needed (Supplement). 90 Can 0  . ondansetron (ZOFRAN) 8 MG tablet Take 1 tablet (8 mg total) by  mouth 2 (two) times daily as needed. Start on the third day after chemotherapy. 30 tablet 1  . oxyCODONE (OXY IR/ROXICODONE) 5 MG immediate release tablet Take 1 tablet (5 mg total) by mouth every 6 (six) hours as needed for severe pain. 30 tablet 0  . oxyCODONE-acetaminophen (PERCOCET) 5-325 MG tablet Take 1 tablet by mouth every 6 (six) hours as needed for severe pain. 20 tablet 0  . pantoprazole (PROTONIX) 40 MG tablet TAKE 1 TABLET BY MOUTH TWICE A DAY 60 tablet 1  . polyethylene glycol (MIRALAX) packet Take 17 g by mouth daily. 30 each 0  . prochlorperazine (COMPAZINE) 10 MG tablet Take 1 tablet (10 mg total) by mouth every 6 (six) hours as needed (Nausea or vomiting). 30 tablet 1  . senna-docusate (SENOKOT-S) 8.6-50 MG tablet Take 2 tablets by mouth 2 (two) times daily. 30 tablet 0  . Vitamin D, Ergocalciferol, (DRISDOL) 50000 units CAPS capsule Take 50,000 Units by mouth once a week.  5   No current facility-administered medications for this visit.     PHYSICAL EXAMINATION: ECOG PERFORMANCE STATUS: 2 - Symptomatic, <50% confined to bed  Vitals:   06/28/18 1441  BP: (!) 153/83  Pulse: 80  Resp: 18  Temp: 98.9 F (37.2 C)  SpO2: 100%   Filed Weights   06/28/18 1441  Weight: 134 lb 14.4 oz (61.2 kg)    GENERAL:alert, no distress and comfortable SKIN: skin color, texture, turgor are normal, no rashes or significant lesions EYES: normal, Conjunctiva are pink and non-injected, sclera clear OROPHARYNX:no exudate, no erythema and lips, buccal mucosa, and tongue normal  NECK: supple, thyroid normal size, non-tender, without nodularity LYMPH:  no palpable lymphadenopathy in the cervical, axillary or inguinal LUNGS: clear to auscultation and percussion with normal breathing effort HEART: regular rate & rhythm and no murmurs and no lower extremity edema ABDOMEN:abdomen soft, with normal bowel sounds (+) right abdominal tenderness (+) Palpable hepatomegaly 9cm below ribcage    Musculoskeletal:no cyanosis of digits and no clubbing  NEURO: alert & oriented x 3 with fluent speech, no focal motor/sensory deficits  LABORATORY DATA:  I have reviewed the data as listed CBC Latest Ref Rng & Units 06/28/2018 06/14/2018 06/01/2018  WBC 4.0 - 10.5 K/uL 7.8 8.5 7.5  Hemoglobin 12.0 - 15.0 g/dL 8.5(L) 8.8(L) 9.2(L)  Hematocrit 36.0 - 46.0 % 26.7(L) 27.6(L) 28.7(L)  Platelets 150 - 400 K/uL 211 198 292     CMP Latest Ref Rng & Units 06/28/2018 06/14/2018 06/01/2018  Glucose 70 - 99 mg/dL 114(H) 127(H) 96  BUN 8 - 23 mg/dL 13 16 31(H)  Creatinine 0.44 - 1.00 mg/dL 3.25(HH) 3.91(HH) 5.40(HH)  Sodium 135 - 145 mmol/L 139 139 141  Potassium 3.5 - 5.1 mmol/L 4.2 3.7 3.8  Chloride 98 - 111 mmol/L 100 99 99  CO2 22 - 32 mmol/L 31 30 31   Calcium 8.9 - 10.3 mg/dL 8.3(L) 8.3(L) 8.2(L)  Total Protein 6.5 - 8.1 g/dL 7.0 7.4 7.3  Total Bilirubin 0.3 - 1.2 mg/dL 0.2(L) 0.3 0.3  Alkaline Phos 38 - 126 U/L 142(H) 147(H) 144(H)  AST 15 -  41 U/L 20 14(L) 13(L)  ALT 0 - 44 U/L 17 9 7       RADIOGRAPHIC STUDIES: I have personally reviewed the radiological images as listed and agreed with the findings in the report. No results found.   ASSESSMENT & PLAN:  Mary Chapman is a 73 y.o. female with   1. Adenocarcinomainliver, likelyintrahepatic cholangiocarcinoma, cT2N1M0 stage IIIB, unresectable, MS-Stable  -Diagnosed in 02/2018. She is not eligible for surgery due to tumor size and location.She was seen by Dr[facility] Cloyd Stagers [facility] -Currently onfirst lineCisplatin and Gemcitabine every 2 weeks since 04/2018 with dose reduction. She tolerated first cycle well, with mild fatigue. She had a moderate anemia after chemo, required blood transfusion. No other significant side effects. -Plan to repeat a staging CT scan after 4 cycles chemo.  -Her abdominal pain remains controlled with Oxycodone as needed. She can use OTC pain medication for mild to moderate pain.  -She still has right abdominal  tenderness and hepatomegaly on exam today (06/28/18) Will continue to monitor.  -Labs reviewed, Hg at 8.5 otherwise CBC WNL. CMP and Ca 19.9 still pending. She will proceed with cycle 4 Gem/Cis tomorrow.  -CT scan within next 2 weeks. If she has stable to mild progression, will increase chemo to 2 weeks on/1 week off.  -F/u in 2 weeks  2. HTN -Currently on metoprolol. -Her BP improved to 153/83 today (06/28/18).   3. End Stage Renal Disease, on HD MWF -Treated with dialysis  -f/u with nephrologist Dr. Augustin Coupe  4. Anemia of chronic disease -Due to CKD and malignancy -She received blood transfusion after first cycle chemo. -Hg at 8.5 today (06/28/18). I recommend she take a multivitamin daily.  -Will consider blood transfusion when Hg <8.   5.Goal of care discussion  -The patient understands the goal of care is palliative, to prolong his life  -she is full code now  Plan -labs reviewed, good to proceed with treatmentwith cycle 4 cisplatin and gemcitabine tomorrow with same dose reduction  -CT CTP with contrast in 1-2 weeks -Lab, flush, f/u and chemo cisplatin and gemcitabine in 2 weeks     No problem-specific Assessment & Plan notes found for this encounter.   Orders Placed This Encounter  Procedures  . CT Abdomen Pelvis W Contrast    Standing Status:   Future    Standing Expiration Date:   06/28/2019    Order Specific Question:   If indicated for the ordered procedure, I authorize the administration of contrast media per Radiology protocol    Answer:   Yes    Order Specific Question:   Preferred imaging location?    Answer:   [facility]    Order Specific Question:   Is Oral Contrast requested for this exam?    Answer:   Yes, Per Radiology protocol    Order Specific Question:   Radiology Contrast Protocol - do NOT remove file path    Answer:   \\[email or web address]  . CT Chest W Contrast    Standing Status:   Future    Standing  Expiration Date:   06/28/2019    Order Specific Question:   If indicated for the ordered procedure, I authorize the administration of contrast media per Radiology protocol    Answer:   Yes    Order Specific Question:   Preferred imaging location?    Answer:   [facility]    Order Specific Question:   Radiology Contrast Protocol - do NOT remove file path  Answer:   \\[email or web address]   All questions were answered. The patient knows to call the clinic with any problems, questions or concerns. No barriers to learning was detected. I spent 20 minutes counseling the patient face to face. The total time spent in the appointment was 25 minutes and more than 50% was on counseling and review of test results     Truitt Merle, MD 06/28/2018   I, Joslyn Devon, am acting as scribe for Truitt Merle, MD.   I have reviewed the above documentation for accuracy and completeness, and I agree with the above.

## 2018-06-28 ENCOUNTER — Inpatient Hospital Stay: Payer: Medicare Other

## 2018-06-28 ENCOUNTER — Inpatient Hospital Stay: Payer: Medicare Other | Attending: Hematology | Admitting: Hematology

## 2018-06-28 ENCOUNTER — Telehealth: Payer: Self-pay

## 2018-06-28 ENCOUNTER — Encounter: Payer: Self-pay | Admitting: Hematology

## 2018-06-28 VITALS — BP 153/83 | HR 80 | Temp 98.9°F | Resp 18 | Ht 66.0 in | Wt 134.9 lb

## 2018-06-28 DIAGNOSIS — I10 Essential (primary) hypertension: Secondary | ICD-10-CM

## 2018-06-28 DIAGNOSIS — N186 End stage renal disease: Secondary | ICD-10-CM

## 2018-06-28 DIAGNOSIS — D490 Neoplasm of unspecified behavior of digestive system: Secondary | ICD-10-CM

## 2018-06-28 DIAGNOSIS — C221 Intrahepatic bile duct carcinoma: Secondary | ICD-10-CM

## 2018-06-28 DIAGNOSIS — I120 Hypertensive chronic kidney disease with stage 5 chronic kidney disease or end stage renal disease: Secondary | ICD-10-CM | POA: Insufficient documentation

## 2018-06-28 DIAGNOSIS — Z79899 Other long term (current) drug therapy: Secondary | ICD-10-CM | POA: Diagnosis not present

## 2018-06-28 DIAGNOSIS — Z95828 Presence of other vascular implants and grafts: Secondary | ICD-10-CM

## 2018-06-28 DIAGNOSIS — D631 Anemia in chronic kidney disease: Secondary | ICD-10-CM | POA: Diagnosis not present

## 2018-06-28 DIAGNOSIS — Z5111 Encounter for antineoplastic chemotherapy: Secondary | ICD-10-CM | POA: Diagnosis present

## 2018-06-28 DIAGNOSIS — C227 Other specified carcinomas of liver: Secondary | ICD-10-CM | POA: Insufficient documentation

## 2018-06-28 DIAGNOSIS — K6389 Other specified diseases of intestine: Secondary | ICD-10-CM

## 2018-06-28 DIAGNOSIS — Z992 Dependence on renal dialysis: Secondary | ICD-10-CM

## 2018-06-28 LAB — CMP (CANCER CENTER ONLY)
ALT: 17 U/L (ref 0–44)
AST: 20 U/L (ref 15–41)
Albumin: 2.8 g/dL — ABNORMAL LOW (ref 3.5–5.0)
Alkaline Phosphatase: 142 U/L — ABNORMAL HIGH (ref 38–126)
Anion gap: 8 (ref 5–15)
BUN: 13 mg/dL (ref 8–23)
CO2: 31 mmol/L (ref 22–32)
Calcium: 8.3 mg/dL — ABNORMAL LOW (ref 8.9–10.3)
Chloride: 100 mmol/L (ref 98–111)
Creatinine: 3.25 mg/dL (ref 0.44–1.00)
GFR, Est AFR Am: 16 mL/min — ABNORMAL LOW (ref >60)
GFR, Estimated: 14 mL/min — ABNORMAL LOW (ref >60)
Glucose, Bld: 114 mg/dL — ABNORMAL HIGH (ref 70–99)
Potassium: 4.2 mmol/L (ref 3.5–5.1)
Sodium: 139 mmol/L (ref 135–145)
Total Bilirubin: 0.2 mg/dL — ABNORMAL LOW (ref 0.3–1.2)
Total Protein: 7 g/dL (ref 6.5–8.1)

## 2018-06-28 LAB — CBC WITH DIFFERENTIAL (CANCER CENTER ONLY)
Abs Immature Granulocytes: 0.05 10*3/uL (ref 0.00–0.07)
Basophils Absolute: 0 10*3/uL (ref 0.0–0.1)
Basophils Relative: 0 %
Eosinophils Absolute: 0.1 10*3/uL (ref 0.0–0.5)
Eosinophils Relative: 2 %
HCT: 26.7 % — ABNORMAL LOW (ref 36.0–46.0)
Hemoglobin: 8.5 g/dL — ABNORMAL LOW (ref 12.0–15.0)
Immature Granulocytes: 1 %
LYMPHS ABS: 1.1 10*3/uL (ref 0.7–4.0)
Lymphocytes Relative: 14 %
MCH: 30.8 pg (ref 26.0–34.0)
MCHC: 31.8 g/dL (ref 30.0–36.0)
MCV: 96.7 fL (ref 80.0–100.0)
MONO ABS: 0.8 10*3/uL (ref 0.1–1.0)
MONOS PCT: 11 %
Neutro Abs: 5.7 10*3/uL (ref 1.7–7.7)
Neutrophils Relative %: 72 %
Platelet Count: 211 10*3/uL (ref 150–400)
RBC: 2.76 MIL/uL — ABNORMAL LOW (ref 3.87–5.11)
RDW: 21.3 % — ABNORMAL HIGH (ref 11.5–15.5)
WBC Count: 7.8 10*3/uL (ref 4.0–10.5)
nRBC: 0 % (ref 0.0–0.2)

## 2018-06-28 MED ORDER — SODIUM CHLORIDE 0.9% FLUSH
10.0000 mL | INTRAVENOUS | Status: DC | PRN
  Administered 2018-06-28: 10 mL
  Filled 2018-06-28: qty 10

## 2018-06-28 MED ORDER — HEPARIN SOD (PORK) LOCK FLUSH 100 UNIT/ML IV SOLN
500.0000 [IU] | Freq: Once | INTRAVENOUS | Status: AC | PRN
  Administered 2018-06-28: 500 [IU]
  Filled 2018-06-28: qty 5

## 2018-06-28 NOTE — Telephone Encounter (Signed)
Printed avs and calender of upcoming appointment. Per 2/10 los 

## 2018-06-29 ENCOUNTER — Telehealth: Payer: Self-pay

## 2018-06-29 ENCOUNTER — Inpatient Hospital Stay: Payer: Medicare Other

## 2018-06-29 VITALS — BP 176/90 | HR 79 | Temp 99.0°F | Resp 18

## 2018-06-29 DIAGNOSIS — C221 Intrahepatic bile duct carcinoma: Secondary | ICD-10-CM

## 2018-06-29 DIAGNOSIS — Z5111 Encounter for antineoplastic chemotherapy: Secondary | ICD-10-CM | POA: Diagnosis not present

## 2018-06-29 LAB — CANCER ANTIGEN 19-9: CA 19-9: 38 U/mL — ABNORMAL HIGH (ref 0–35)

## 2018-06-29 MED ORDER — SODIUM CHLORIDE 0.9% FLUSH
10.0000 mL | INTRAVENOUS | Status: DC | PRN
  Administered 2018-06-29: 10 mL
  Filled 2018-06-29: qty 10

## 2018-06-29 MED ORDER — PALONOSETRON HCL INJECTION 0.25 MG/5ML
INTRAVENOUS | Status: AC
  Filled 2018-06-29: qty 5

## 2018-06-29 MED ORDER — SODIUM CHLORIDE 0.9 % IV SOLN
12.5000 mg/m2 | Freq: Once | INTRAVENOUS | Status: AC
  Administered 2018-06-29: 21 mg via INTRAVENOUS
  Filled 2018-06-29: qty 21

## 2018-06-29 MED ORDER — SODIUM CHLORIDE 0.9 % IV SOLN
500.0000 mg/m2 | Freq: Once | INTRAVENOUS | Status: AC
  Administered 2018-06-29: 836 mg via INTRAVENOUS
  Filled 2018-06-29: qty 21.99

## 2018-06-29 MED ORDER — SODIUM CHLORIDE 0.9 % IV SOLN
Freq: Once | INTRAVENOUS | Status: AC
  Administered 2018-06-29: 13:00:00 via INTRAVENOUS
  Filled 2018-06-29: qty 5

## 2018-06-29 MED ORDER — SODIUM CHLORIDE 0.9 % IV SOLN: 500.0000 mg/m2 | Freq: Once | INTRAVENOUS | Status: DC

## 2018-06-29 MED ORDER — HEPARIN SOD (PORK) LOCK FLUSH 100 UNIT/ML IV SOLN
500.0000 [IU] | Freq: Once | INTRAVENOUS | Status: AC | PRN
  Administered 2018-06-29: 500 [IU]
  Filled 2018-06-29: qty 5

## 2018-06-29 MED ORDER — PALONOSETRON HCL INJECTION 0.25 MG/5ML
0.2500 mg | Freq: Once | INTRAVENOUS | Status: AC
  Administered 2018-06-29: 0.25 mg via INTRAVENOUS

## 2018-06-29 MED ORDER — SODIUM CHLORIDE 0.9 % IV SOLN
Freq: Once | INTRAVENOUS | Status: AC
  Administered 2018-06-29: 13:00:00 via INTRAVENOUS
  Filled 2018-06-29: qty 250

## 2018-06-29 NOTE — Progress Notes (Signed)
Ok to treat with CMP and CBC from 06/28/2018 per MD Burr Medico

## 2018-06-29 NOTE — Telephone Encounter (Signed)
Per Dr. Burr Medico okay to treat today with Creatinine 3.25, notified [facility] RN in Infusion.

## 2018-06-29 NOTE — Addendum Note (Signed)
Addended by: Truitt Merle on: 06/29/2018 11:17 AM   Modules accepted: Orders

## 2018-06-29 NOTE — Telephone Encounter (Signed)
Per 2/10 los added the words (Trade off) into the treatment

## 2018-06-29 NOTE — Patient Instructions (Signed)
[facility] Discharge Instructions for Patients Receiving Chemotherapy  Today you received the following chemotherapy agents Gemzar and Cisplatin  To help prevent nausea and vomiting after your treatment, we encourage you to take your nausea medication as directed   If you develop nausea and vomiting that is not controlled by your nausea medication, call the clinic.   BELOW ARE SYMPTOMS THAT SHOULD BE REPORTED IMMEDIATELY:  *FEVER GREATER THAN 100.5 F  *CHILLS WITH OR WITHOUT FEVER  NAUSEA AND VOMITING THAT IS NOT CONTROLLED WITH YOUR NAUSEA MEDICATION  *UNUSUAL SHORTNESS OF BREATH  *UNUSUAL BRUISING OR BLEEDING  TENDERNESS IN MOUTH AND THROAT WITH OR WITHOUT PRESENCE OF ULCERS  *URINARY PROBLEMS  *BOWEL PROBLEMS  UNUSUAL RASH Items with * indicate a potential emergency and should be followed up as soon as possible.  Feel free to call the clinic should you have any questions or concerns. The clinic phone number is [phone].  Please show the CHEMO ALERT CARD at check-in to the Emergency Department and triage nurse.   

## 2018-07-09 ENCOUNTER — Other Ambulatory Visit: Payer: Self-pay | Admitting: Hematology

## 2018-07-12 ENCOUNTER — Telehealth: Payer: Self-pay | Admitting: Hematology

## 2018-07-12 ENCOUNTER — Inpatient Hospital Stay: Payer: Medicare Other | Admitting: Nurse Practitioner

## 2018-07-12 ENCOUNTER — Inpatient Hospital Stay: Payer: Medicare Other

## 2018-07-12 NOTE — Telephone Encounter (Signed)
R/s appt per 2/24 sch message - pt is aware of appt date and time  

## 2018-07-12 NOTE — Progress Notes (Signed)
[place]   [phone]) [phone] [phone]) [phone]   Clinic Follow up Note   Patient Care Team: Glendon Axe, MD as PCP - General (Family Medicine) [name], MD as PCP - Cardiology (Cardiology) Dwana Melena, MD as Attending Physician (Nephrology) Truitt Merle, MD as Consulting Physician (Hematology) 07/12/2018  CHIEF COMPLAINT: F/u on intrahepatic cholangiocarcinoma   SUMMARY OF ONCOLOGIC HISTORY:   Intrahepatic cholangiocarcinoma ([place])   02/23/2018 Imaging    CT AP W Contrast 02/23/18  IMPRESSION: 1. Large multi-cystic lesion in the central aspect of the liver adjacent to the gallbladder fossa, with several smaller satellite lesions. These are all new compared to prior CT the chest, abdomen and pelvis 08/07/2016, concerning for intrahepatic abscesses. 2. Extensive mural thickening and inflammatory changes in the region of the cecum. This is nonspecific, and could reflect either right-sided diverticulitis, focal area of colitis, or potentially even underlying neoplasm. 3. Cholelithiasis. Gallbladder is nearly completely contracted without surrounding inflammatory changes to suggest an acute cholecystitis at this time. 4. Left-sided nephrolithiasis measuring up to 8 mm in the upper pole collecting system of left kidney. No ureteral stones or findings of urinary tract obstruction. 5. Aortic atherosclerosis.    02/27/2018 Initial Biopsy    Diagnosis 02/27/18  Liver, needle/core biopsy, Right Hepatic Lobe - ADENOCARCINOMA. SEE NOTE.    02/27/2018 Miscellaneous       03/05/2018 Procedure    Colonoscopy 03/05/18  IMPRESSION - Preparation of the colon was fair. - Non-thrombosed external hemorrhoids found on digital rectal exam. - There was significant looping of the colon. - Severe diverticulosis in the recto-sigmoid colon, in the sigmoid colon, in the descending colon, in the transverse colon, at the hepatic flexure, in the ascending colon and in  the cecum. There was no evidence of diverticular bleeding. - A single (solitary) ulcer in the cecum - highly concerning for underlying malignancy. Biopsied. Phlegmonous change is possible, though often in setting of complicated diverticulosis/diverticulitis ulceration would not be as common. - Erythematous mucosa in the transverse colon, at the hepatic flexure, in the ascending colon and in the cecum. Biopsied. - Normal mucosa in the rectum, in the recto-sigmoid colon, in the sigmoid colon and in the descending colon. Biopsied. - Non-bleeding non-thrombosed external and internal hemorrhoids. -----Negative for malignancy     03/11/2018 Initial Diagnosis    Intrahepatic cholangiocarcinoma ([place])    04/02/2018 Imaging    CT CAP W contrast 04/02/18  IMPRESSION: 1. 4 mm subpleural nodule in the periphery of the right lower lobe. This is nonspecific, but strongly favored to represent a benign subpleural lymph node. Attention on follow-up studies is recommended to exclude the possibility of metastatic disease. 2. Diffuse bronchial wall thickening with moderate centrilobular and mild paraseptal emphysema; imaging findings suggestive of underlying COPD. 3. Aortic atherosclerosis, in addition to left main and left anterior descending coronary artery disease. Assessment for potential risk factor modification, dietary therapy or pharmacologic therapy may be warranted, if clinically indicated. 4. Nonobstructive calculi in the upper pole collecting system of left kidney measuring up to 7 mm.     04/27/2018 Cancer Staging    Staging form: Intrahepatic Bile Duct, AJCC 8th Edition - Clinical: Stage IIIB (cT2, cN1, cM0) - Signed by Truitt Merle, MD on 04/27/2018    05/18/2018 -  Chemotherapy    Gemcitabine and Cisplatin every 2 weeksstarting on 05/18/2018, with 50% dose reduction     CURRENT THERAPY  Gemcitabine and Cisplatin every 2 weeksstartingon 05/18/2018, with 50% dose  reduction  INTERVAL HISTORY: Mary Chapman is a 73 y.o. female who is here for follow-up. Today, she is here with a family member and is doing well. She was seen in the infusion room. Describes some stomach pain. Her last cycle of chemotherapy went well and denies any major complications.    Pertinent positives and negatives of review of systems are listed and detailed within the above HPI.  REVIEW OF SYSTEMS:  Constitutional: Denies fevers, chills or abnormal weight loss Eyes: Denies blurriness of vision Ears, nose, mouth, throat, and face: Denies mucositis or sore throat Respiratory: Denies cough, dyspnea or wheezes Cardiovascular: Denies palpitation, chest discomfort or lower extremity swelling Gastrointestinal:  Denies nausea, heartburn or change in bowel habits, (+) stomach pain  Skin: Denies abnormal skin rashes Lymphatics: Denies new lymphadenopathy or easy bruising Neurological:Denies numbness, tingling or new weaknesses Behavioral/Psych: Mood is stable, no new changes  All other systems were reviewed with the patient and are negative.  MEDICAL HISTORY:  Past Medical History:  Diagnosis Date  . Diverticulitis   . Focal segmental glomerulosclerosis    ESRD on MWF HD  . Hypertension   . Tobacco abuse     SURGICAL HISTORY: Past Surgical History:  Procedure Laterality Date  . AV FISTULA PLACEMENT Left 03/18/2017   Procedure: Brachiocephalic ARTERIOVENOUS (AV) FISTULA CREATION left arm;  Surgeon: Elam Dutch, MD;  Location: [facility];  Service: Vascular;  Laterality: Left;  . BIOPSY  03/05/2018   Procedure: BIOPSY;  Surgeon: Rush Landmark Telford Nab., MD;  Location: [facility];  Service: Gastroenterology;;  enteroscopy bx /cytology brushing Greig Castilla bx  . COLONOSCOPY WITH PROPOFOL N/A 03/05/2018   Procedure: COLONOSCOPY WITH PROPOFOL;  Surgeon: Rush Landmark Telford Nab., MD;  Location: [facility];  Service: Gastroenterology;  Laterality: N/A;  Bx, Spot  . ENTEROSCOPY N/A  03/05/2018   Procedure: ENTEROSCOPY;  Surgeon: Rush Landmark Telford Nab., MD;  Location: [facility];  Service: Gastroenterology;  Laterality: N/A;  BX, Brushing, & Spot  . INSERTION OF DIALYSIS CATHETER N/A 03/18/2017   Procedure: INSERTION OF DIALYSIS CATHETER;  Surgeon: Elam Dutch, MD;  Location: [facility];  Service: Vascular;  Laterality: N/A;  . IR GENERIC HISTORICAL  08/15/2016   IR US GUIDE VASC ACCESS RIGHT 08/15/2016 Arne Cleveland, MD [facility]  . IR GENERIC HISTORICAL  08/15/2016   IR FLUORO GUIDE CV LINE RIGHT 08/15/2016 Arne Cleveland, MD [facility]  . IR IMAGING GUIDED PORT INSERTION  05/06/2018  . SUBMUCOSAL INJECTION  03/05/2018   Procedure: SUBMUCOSAL INJECTION;  Surgeon: Rush Landmark Telford Nab., MD;  Location: [facility];  Service: Gastroenterology;;  spot tatoo  . TUBAL LIGATION      I have reviewed the social history and family history with the patient and they are unchanged from previous note.  ALLERGIES:  has No Known Allergies.  MEDICATIONS:  Current Outpatient Medications  Medication Sig Dispense Refill  . folic acid (FOLVITE) 1 MG tablet Take 1 tablet (1 mg total) by mouth daily. 30 tablet 0  . lidocaine-prilocaine (EMLA) cream Apply to affected area once 30 g 3  . metoprolol tartrate (LOPRESSOR) 25 MG tablet Take 0.5 tablets (12.5 mg total) by mouth 2 (two) times daily. 30 tablet 0  . multivitamin (RENA-VIT) TABS tablet Take 1 tablet by mouth at bedtime. 30 tablet 3  . nicotine (NICODERM CQ - DOSED IN MG/24 HOURS) 21 mg/24hr patch Place 1 patch (21 mg total) onto the skin daily. 28 patch 0  . Nutritional Supplements (FEEDING SUPPLEMENT, NEPRO CARB STEADY,) LIQD Take 237  mLs by mouth 3 (three) times daily as needed (Supplement). 90 Can 0  . ondansetron (ZOFRAN) 8 MG tablet Take 1 tablet (8 mg total) by mouth 2 (two) times daily as needed. Start on the third day after chemotherapy. 30 tablet 1  . oxyCODONE (OXY IR/ROXICODONE) 5 MG immediate release tablet  Take 1 tablet (5 mg total) by mouth every 6 (six) hours as needed for severe pain. 30 tablet 0  . oxyCODONE-acetaminophen (PERCOCET) 5-325 MG tablet Take 1 tablet by mouth every 6 (six) hours as needed for severe pain. 20 tablet 0  . pantoprazole (PROTONIX) 40 MG tablet TAKE 1 TABLET BY MOUTH TWICE A DAY 60 tablet 1  . polyethylene glycol (MIRALAX) packet Take 17 g by mouth daily. 30 each 0  . prochlorperazine (COMPAZINE) 10 MG tablet Take 1 tablet (10 mg total) by mouth every 6 (six) hours as needed (Nausea or vomiting). 30 tablet 1  . senna-docusate (SENOKOT-S) 8.6-50 MG tablet Take 2 tablets by mouth 2 (two) times daily. 30 tablet 0  . Vitamin D, Ergocalciferol, (DRISDOL) 50000 units CAPS capsule Take 50,000 Units by mouth once a week.  5   No current facility-administered medications for this visit.     PHYSICAL EXAMINATION: ECOG PERFORMANCE STATUS: 1 - Symptomatic but completely ambulatory  Vitals with BMI 07/13/2018  Height   Weight 139 lbs 4 oz  BMI 63.33  Systolic 545  Diastolic 94  Pulse 79  Respirations 17   GENERAL:alert, no distress and comfortable SKIN: skin color, texture, turgor are normal, no rashes or significant lesions EYES: normal, Conjunctiva are pink and non-injected, sclera clear OROPHARYNX:no exudate, no erythema and lips, buccal mucosa, and tongue normal  NECK: supple, thyroid normal size, non-tender, without nodularity LYMPH:  no palpable lymphadenopathy in the cervical, axillary or inguinal LUNGS: clear to auscultation and percussion with normal breathing effort HEART: regular rate & rhythm and no murmurs and no lower extremity edema ABDOMEN:abdomen soft, non-tender and normal bowel sounds Musculoskeletal:no cyanosis of digits and no clubbing  NEURO: alert & oriented x 3 with fluent speech, no focal motor/sensory deficits  LABORATORY DATA:  I have reviewed the data as listed CBC Latest Ref Rng & Units 06/28/2018 06/14/2018 06/01/2018  WBC 4.0 - 10.5 K/uL  7.8 8.5 7.5  Hemoglobin 12.0 - 15.0 g/dL 8.5(L) 8.8(L) 9.2(L)  Hematocrit 36.0 - 46.0 % 26.7(L) 27.6(L) 28.7(L)  Platelets 150 - 400 K/uL 211 198 292     CMP Latest Ref Rng & Units 06/28/2018 06/14/2018 06/01/2018  Glucose 70 - 99 mg/dL 114(H) 127(H) 96  BUN 8 - 23 mg/dL 13 16 31(H)  Creatinine 0.44 - 1.00 mg/dL 3.25(HH) 3.91(HH) 5.40(HH)  Sodium 135 - 145 mmol/L 139 139 141  Potassium 3.5 - 5.1 mmol/L 4.2 3.7 3.8  Chloride 98 - 111 mmol/L 100 99 99  CO2 22 - 32 mmol/L 31 30 31   Calcium 8.9 - 10.3 mg/dL 8.3(L) 8.3(L) 8.2(L)  Total Protein 6.5 - 8.1 g/dL 7.0 7.4 7.3  Total Bilirubin 0.3 - 1.2 mg/dL 0.2(L) 0.3 0.3  Alkaline Phos 38 - 126 U/L 142(H) 147(H) 144(H)  AST 15 - 41 U/L 20 14(L) 13(L)  ALT 0 - 44 U/L 17 9 7       RADIOGRAPHIC STUDIES: I have personally reviewed the radiological images as listed and agreed with the findings in the report. No results found.   ASSESSMENT & PLAN:  Mary Chapman is a 73 y.o. female with history of  1. Adenocarcinomainliver, likelyintrahepatic cholangiocarcinoma, cT2N1M0  stage IIIB, unresectable, MS-Stable -Diagnosed in 02/2018. She is not eligible for surgery due to tumor size and location.She was seen by Dr[facility] Cloyd Stagers at [facility] -Currently onfirst lineCisplatin and Gemcitabineevery 2 weekssince 04/2018 with dose reduction.She tolerated first cycle well, with mild fatigue. She had a moderate anemia after chemo, required blood transfusion. No other significant side effects. - She has abdominal pain that remains controlled with Oxycodone as needed. She can use OTC pain medication for mild to moderate pain.  -She has developed slightly worse right abdominal tenderness and hepatomegaly on exam, but feels better this week  -Lab reviewed, hemoglobin 8.5, no other cytopenias, adequate for treatment, will proceed chemotherapy today. -She is scheduled for restaging CT scan on July 19, 2018, I will see her after the scan  2. HTN -Currently on  metoprolol.  3. End Stage Renal Disease, on HD MWF -Treated with dialysis  -f/u with nephrologist Dr. Augustin Coupe  4. Anemia of chronic disease -Due to CKD and malignancy -She received blood transfusion after first cycle chemo. - I previously recommended her to take multivitamins  -Will consider blood transfusion when Hg <8.   5.Goal of care discussion  -The patient understands the goal of care is palliative, to prolong his life -she is full code now  Plan -labs reviewed, good to proceed with treatmentwith cycle 5 cisplatin and gemcitabine today -she is scheduled for CT scan on 3/2 -Lab, flush, f/u after scan     No problem-specific Assessment & Plan notes found for this encounter.   No orders of the defined types were placed in this encounter.  All questions were answered. The patient knows to call the clinic with any problems, questions or concerns. No barriers to learning was detected. I spent 15 minutes counseling the patient face to face. The total time spent in the appointment was 20 minutes and more than 50% was on counseling and review of test results  I, Manson Allan am acting as scribe for Dr. Truitt Merle.  I have reviewed the above documentation for accuracy and completeness, and I agree with the above.     Truitt Merle, MD 07/12/2018

## 2018-07-13 ENCOUNTER — Ambulatory Visit: Payer: Medicare Other

## 2018-07-13 ENCOUNTER — Telehealth: Payer: Self-pay | Admitting: Hematology

## 2018-07-13 ENCOUNTER — Inpatient Hospital Stay: Payer: Medicare Other

## 2018-07-13 ENCOUNTER — Inpatient Hospital Stay (HOSPITAL_BASED_OUTPATIENT_CLINIC_OR_DEPARTMENT_OTHER): Payer: Medicare Other | Admitting: Hematology

## 2018-07-13 ENCOUNTER — Telehealth: Payer: Self-pay | Admitting: *Deleted

## 2018-07-13 VITALS — BP 174/94 | HR 79 | Temp 98.4°F | Resp 17 | Wt 139.2 lb

## 2018-07-13 DIAGNOSIS — C221 Intrahepatic bile duct carcinoma: Secondary | ICD-10-CM

## 2018-07-13 DIAGNOSIS — N186 End stage renal disease: Secondary | ICD-10-CM

## 2018-07-13 DIAGNOSIS — Z5111 Encounter for antineoplastic chemotherapy: Secondary | ICD-10-CM | POA: Diagnosis not present

## 2018-07-13 DIAGNOSIS — Z95828 Presence of other vascular implants and grafts: Secondary | ICD-10-CM

## 2018-07-13 DIAGNOSIS — D631 Anemia in chronic kidney disease: Secondary | ICD-10-CM

## 2018-07-13 DIAGNOSIS — D490 Neoplasm of unspecified behavior of digestive system: Secondary | ICD-10-CM

## 2018-07-13 DIAGNOSIS — C227 Other specified carcinomas of liver: Secondary | ICD-10-CM | POA: Diagnosis not present

## 2018-07-13 DIAGNOSIS — I120 Hypertensive chronic kidney disease with stage 5 chronic kidney disease or end stage renal disease: Secondary | ICD-10-CM

## 2018-07-13 DIAGNOSIS — Z79899 Other long term (current) drug therapy: Secondary | ICD-10-CM

## 2018-07-13 DIAGNOSIS — K6389 Other specified diseases of intestine: Secondary | ICD-10-CM

## 2018-07-13 LAB — CMP (CANCER CENTER ONLY)
ALT: 9 U/L (ref 0–44)
AST: 13 U/L — AB (ref 15–41)
Albumin: 2.8 g/dL — ABNORMAL LOW (ref 3.5–5.0)
Alkaline Phosphatase: 143 U/L — ABNORMAL HIGH (ref 38–126)
Anion gap: 10 (ref 5–15)
BUN: 29 mg/dL — ABNORMAL HIGH (ref 8–23)
CO2: 26 mmol/L (ref 22–32)
Calcium: 8.3 mg/dL — ABNORMAL LOW (ref 8.9–10.3)
Chloride: 102 mmol/L (ref 98–111)
Creatinine: 5.73 mg/dL (ref 0.44–1.00)
GFR, EST AFRICAN AMERICAN: 8 mL/min — AB (ref >60)
GFR, EST NON AFRICAN AMERICAN: 7 mL/min — AB (ref >60)
Glucose, Bld: 98 mg/dL (ref 70–99)
Potassium: 5.5 mmol/L — ABNORMAL HIGH (ref 3.5–5.1)
Sodium: 138 mmol/L (ref 135–145)
Total Bilirubin: 0.3 mg/dL (ref 0.3–1.2)
Total Protein: 7 g/dL (ref 6.5–8.1)

## 2018-07-13 LAB — CBC WITH DIFFERENTIAL (CANCER CENTER ONLY)
Abs Immature Granulocytes: 0.03 10*3/uL (ref 0.00–0.07)
Basophils Absolute: 0 10*3/uL (ref 0.0–0.1)
Basophils Relative: 0 %
Eosinophils Absolute: 0.1 10*3/uL (ref 0.0–0.5)
Eosinophils Relative: 2 %
HCT: 24.9 % — ABNORMAL LOW (ref 36.0–46.0)
HEMOGLOBIN: 7.9 g/dL — AB (ref 12.0–15.0)
Immature Granulocytes: 0 %
Lymphocytes Relative: 14 %
Lymphs Abs: 1.2 10*3/uL (ref 0.7–4.0)
MCH: 31.9 pg (ref 26.0–34.0)
MCHC: 31.7 g/dL (ref 30.0–36.0)
MCV: 100.4 fL — ABNORMAL HIGH (ref 80.0–100.0)
Monocytes Absolute: 1 10*3/uL (ref 0.1–1.0)
Monocytes Relative: 11 %
Neutro Abs: 6.5 10*3/uL (ref 1.7–7.7)
Neutrophils Relative %: 73 %
Platelet Count: 183 10*3/uL (ref 150–400)
RBC: 2.48 MIL/uL — ABNORMAL LOW (ref 3.87–5.11)
RDW: 20 % — ABNORMAL HIGH (ref 11.5–15.5)
WBC Count: 8.8 10*3/uL (ref 4.0–10.5)
nRBC: 0 % (ref 0.0–0.2)

## 2018-07-13 MED ORDER — SODIUM CHLORIDE 0.9 % IV SOLN
Freq: Once | INTRAVENOUS | Status: AC
  Administered 2018-07-13: 13:00:00 via INTRAVENOUS
  Filled 2018-07-13: qty 250

## 2018-07-13 MED ORDER — PALONOSETRON HCL INJECTION 0.25 MG/5ML
0.2500 mg | Freq: Once | INTRAVENOUS | Status: AC
  Administered 2018-07-13: 0.25 mg via INTRAVENOUS

## 2018-07-13 MED ORDER — SODIUM CHLORIDE 0.9 % IV SOLN
12.5000 mg/m2 | Freq: Once | INTRAVENOUS | Status: AC
  Administered 2018-07-13: 21 mg via INTRAVENOUS
  Filled 2018-07-13: qty 21

## 2018-07-13 MED ORDER — SODIUM CHLORIDE 0.9% FLUSH
10.0000 mL | INTRAVENOUS | Status: DC | PRN
  Administered 2018-07-13: 10 mL
  Filled 2018-07-13: qty 10

## 2018-07-13 MED ORDER — HEPARIN SOD (PORK) LOCK FLUSH 100 UNIT/ML IV SOLN
500.0000 [IU] | Freq: Once | INTRAVENOUS | Status: DC | PRN
  Filled 2018-07-13: qty 5

## 2018-07-13 MED ORDER — SODIUM CHLORIDE 0.9% FLUSH
10.0000 mL | INTRAVENOUS | Status: DC | PRN
  Filled 2018-07-13: qty 10

## 2018-07-13 MED ORDER — SODIUM CHLORIDE 0.9 % IV SOLN
Freq: Once | INTRAVENOUS | Status: AC
  Administered 2018-07-13: 14:00:00 via INTRAVENOUS
  Filled 2018-07-13: qty 5

## 2018-07-13 MED ORDER — SODIUM CHLORIDE 0.9 % IV SOLN
500.0000 mg/m2 | Freq: Once | INTRAVENOUS | Status: AC
  Administered 2018-07-13: 836 mg via INTRAVENOUS
  Filled 2018-07-13: qty 21.99

## 2018-07-13 MED ORDER — PALONOSETRON HCL INJECTION 0.25 MG/5ML
INTRAVENOUS | Status: AC
  Filled 2018-07-13: qty 5

## 2018-07-13 NOTE — Patient Instructions (Signed)
[facility] Discharge Instructions for Patients Receiving Chemotherapy  Today you received the following chemotherapy agents Gemzar, Cisplatin  To help prevent nausea and vomiting after your treatment, we encourage you to take your nausea medication as directed   If you develop nausea and vomiting that is not controlled by your nausea medication, call the clinic.   BELOW ARE SYMPTOMS THAT SHOULD BE REPORTED IMMEDIATELY:  *FEVER GREATER THAN 100.5 F  *CHILLS WITH OR WITHOUT FEVER  NAUSEA AND VOMITING THAT IS NOT CONTROLLED WITH YOUR NAUSEA MEDICATION  *UNUSUAL SHORTNESS OF BREATH  *UNUSUAL BRUISING OR BLEEDING  TENDERNESS IN MOUTH AND THROAT WITH OR WITHOUT PRESENCE OF ULCERS  *URINARY PROBLEMS  *BOWEL PROBLEMS  UNUSUAL RASH Items with * indicate a potential emergency and should be followed up as soon as possible.  Feel free to call the clinic should you have any questions or concerns. The clinic phone number is [phone].  Please show the CHEMO ALERT CARD at check-in to the Emergency Department and triage nurse.   

## 2018-07-13 NOTE — Progress Notes (Signed)
Okay to treat with today's labs per Dr. Burr Medico.

## 2018-07-13 NOTE — Telephone Encounter (Signed)
Scheduled appt per 2/25 los.  Added treatment in the book for 03/10.  Will call patient when treatment has been added.

## 2018-07-13 NOTE — Telephone Encounter (Signed)
Mary M.T. call report.  Today's Creat = 5.7.  Scheduled provider F/U today.

## 2018-07-14 ENCOUNTER — Encounter: Payer: Self-pay | Admitting: Hematology

## 2018-07-14 LAB — CANCER ANTIGEN 19-9: CA 19-9: 37 U/mL — ABNORMAL HIGH (ref 0–35)

## 2018-07-15 NOTE — Progress Notes (Signed)
[place]   [phone] [phone]   Clinic Follow up Note   Patient Care Team: Glendon Axe, MD as PCP - General (Family Medicine) [name], MD as PCP - Cardiology (Cardiology) Dwana Melena, MD as Attending Physician (Nephrology) Truitt Merle, MD as Consulting Physician (Hematology) 07/20/2018  CHIEF COMPLAINT: F/u on intrahepatic cholangiocarcinoma   SUMMARY OF ONCOLOGIC HISTORY:   Intrahepatic cholangiocarcinoma ([place])   02/23/2018 Imaging    CT AP W Contrast 02/23/18  IMPRESSION: 1. Large multi-cystic lesion in the central aspect of the liver adjacent to the gallbladder fossa, with several smaller satellite lesions. These are all new compared to prior CT the chest, abdomen and pelvis 08/07/2016, concerning for intrahepatic abscesses. 2. Extensive mural thickening and inflammatory changes in the region of the cecum. This is nonspecific, and could reflect either right-sided diverticulitis, focal area of colitis, or potentially even underlying neoplasm. 3. Cholelithiasis. Gallbladder is nearly completely contracted without surrounding inflammatory changes to suggest an acute cholecystitis at this time. 4. Left-sided nephrolithiasis measuring up to 8 mm in the upper pole collecting system of left kidney. No ureteral stones or findings of urinary tract obstruction. 5. Aortic atherosclerosis.    02/27/2018 Initial Biopsy    Diagnosis 02/27/18  Liver, needle/core biopsy, Right Hepatic Lobe - ADENOCARCINOMA. SEE NOTE.    02/27/2018 Miscellaneous       03/05/2018 Procedure    Colonoscopy 03/05/18  IMPRESSION - Preparation of the colon was fair. - Non-thrombosed external hemorrhoids found on digital rectal exam. - There was significant looping of the colon. - Severe diverticulosis in the recto-sigmoid colon, in the sigmoid colon, in the descending colon, in the transverse colon, at the hepatic flexure, in the ascending colon and in  the cecum. There was no evidence of diverticular bleeding. - A single (solitary) ulcer in the cecum - highly concerning for underlying malignancy. Biopsied. Phlegmonous change is possible, though often in setting of complicated diverticulosis/diverticulitis ulceration would not be as common. - Erythematous mucosa in the transverse colon, at the hepatic flexure, in the ascending colon and in the cecum. Biopsied. - Normal mucosa in the rectum, in the recto-sigmoid colon, in the sigmoid colon and in the descending colon. Biopsied. - Non-bleeding non-thrombosed external and internal hemorrhoids. -----Negative for malignancy     03/11/2018 Initial Diagnosis    Intrahepatic cholangiocarcinoma ([place])    04/02/2018 Imaging    CT CAP W contrast 04/02/18  IMPRESSION: 1. 4 mm subpleural nodule in the periphery of the right lower lobe. This is nonspecific, but strongly favored to represent a benign subpleural lymph node. Attention on follow-up studies is recommended to exclude the possibility of metastatic disease. 2. Diffuse bronchial wall thickening with moderate centrilobular and mild paraseptal emphysema; imaging findings suggestive of underlying COPD. 3. Aortic atherosclerosis, in addition to left main and left anterior descending coronary artery disease. Assessment for potential risk factor modification, dietary therapy or pharmacologic therapy may be warranted, if clinically indicated. 4. Nonobstructive calculi in the upper pole collecting system of left kidney measuring up to 7 mm.     04/27/2018 Cancer Staging    Staging form: Intrahepatic Bile Duct, AJCC 8th Edition - Clinical: Stage IIIB (cT2, cN1, cM0) - Signed by Truitt Merle, MD on 04/27/2018    05/18/2018 -  Chemotherapy    Gemcitabine and Cisplatin every 2 weeksstarting on 05/18/2018, with 50% dose reduction    07/19/2018 Imaging    CT CAP W CONTRAST  IMPRESSION: 1. Dominant inferior liver mass  is decreased in size.  Two subcentimeter clustered low-attenuation lesions in the far inferior right liver lobe, not discretely visualized on the prior noncontrast CT study, can not exclude new small satellite hepatic metastases. 2. Porta hepatis adenopathy is mildly decreased. 3. Small right pulmonary nodules are stable. 4. No additional potential new or progressive metastatic disease. 5. Small pericardial effusion, slightly increased. 6. Cholelithiasis. 7. Moderate diffuse colonic diverticulosis. 8. Aortic Atherosclerosis (ICD10-I70.0) and Emphysema (ICD10-J43.9).      CURRENT THERAPY  Gemcitabine and Cisplatin every 2 weeksstartingon 05/18/2018, with dose reduction  INTERVAL HISTORY: [name] is a 73 y.o. female who is here for follow-up. She had a restaging CT scan on 07/19/2018. Today, she is here with a fmaily member and is doing well. Her stomach pain has much improved and she is no longer experiencing pain. She is tolerating chemotherapy well.    Pertinent positives and negatives of review of systems are listed and detailed within the above HPI.  REVIEW OF SYSTEMS:   Constitutional: Denies fevers, chills or abnormal weight loss Eyes: Denies blurriness of vision Ears, nose, mouth, throat, and face: Denies mucositis or sore throat Respiratory: Denies cough, dyspnea or wheezes Cardiovascular: Denies palpitation, chest discomfort or lower extremity swelling Gastrointestinal:  Denies nausea, heartburn or change in bowel habits Skin: Denies abnormal skin rashes Lymphatics: Denies new lymphadenopathy or easy bruising Neurological:Denies numbness, tingling or new weaknesses Behavioral/Psych: Mood is stable, no new changes  All other systems were reviewed with the patient and are negative.  MEDICAL HISTORY:  Past Medical History:  Diagnosis Date  . Diverticulitis   . Focal segmental glomerulosclerosis    ESRD on MWF HD  . Hypertension   . intrahepatic cholangio ca dx'd10/2019  . Renal  insufficiency    dialysis pt MWF  . Tobacco abuse     SURGICAL HISTORY: Past Surgical History:  Procedure Laterality Date  . AV FISTULA PLACEMENT Left 03/18/2017   Procedure: Brachiocephalic ARTERIOVENOUS (AV) FISTULA CREATION left arm;  Surgeon: Elam Dutch, MD;  Location: [place];  Service: Vascular;  Laterality: Left;  . BIOPSY  03/05/2018   Procedure: BIOPSY;  Surgeon: Rush Landmark Telford Nab., MD;  Location: [place];  Service: Gastroenterology;;  enteroscopy bx /cytology brushing Greig Castilla bx  . COLONOSCOPY WITH PROPOFOL N/A 03/05/2018   Procedure: COLONOSCOPY WITH PROPOFOL;  Surgeon: Rush Landmark Telford Nab., MD;  Location: [place];  Service: Gastroenterology;  Laterality: N/A;  Bx, Spot  . ENTEROSCOPY N/A 03/05/2018   Procedure: ENTEROSCOPY;  Surgeon: Rush Landmark Telford Nab., MD;  Location: [place];  Service: Gastroenterology;  Laterality: N/A;  BX, Brushing, & Spot  . INSERTION OF DIALYSIS CATHETER N/A 03/18/2017   Procedure: INSERTION OF DIALYSIS CATHETER;  Surgeon: Elam Dutch, MD;  Location: [place];  Service: Vascular;  Laterality: N/A;  . IR GENERIC HISTORICAL  08/15/2016   IR US GUIDE VASC ACCESS RIGHT 08/15/2016 Arne Cleveland, MD [facility]  . IR GENERIC HISTORICAL  08/15/2016   IR FLUORO GUIDE CV LINE RIGHT 08/15/2016 Arne Cleveland, MD [facility]  . IR IMAGING GUIDED PORT INSERTION  05/06/2018  . SUBMUCOSAL INJECTION  03/05/2018   Procedure: SUBMUCOSAL INJECTION;  Surgeon: Rush Landmark Telford Nab., MD;  Location: [place];  Service: Gastroenterology;;  spot tatoo  . TUBAL LIGATION      I have reviewed the social history and family history with the patient and they are unchanged from previous note.  ALLERGIES:  has No Known Allergies.  MEDICATIONS:  Current Outpatient Medications  Medication Sig  Dispense Refill  . folic acid (FOLVITE) 1 MG tablet Take 1 tablet (1 mg total) by mouth daily. 30 tablet 0  . lidocaine-prilocaine (EMLA) cream  Apply to affected area once 30 g 3  . metoprolol tartrate (LOPRESSOR) 25 MG tablet Take 0.5 tablets (12.5 mg total) by mouth 2 (two) times daily. 30 tablet 0  . multivitamin (RENA-VIT) TABS tablet Take 1 tablet by mouth at bedtime. 30 tablet 3  . nicotine (NICODERM CQ - DOSED IN MG/24 HOURS) 21 mg/24hr patch Place 1 patch (21 mg total) onto the skin daily. 28 patch 0  . Nutritional Supplements (FEEDING SUPPLEMENT, NEPRO CARB STEADY,) LIQD Take 237 mLs by mouth 3 (three) times daily as needed (Supplement). 90 Can 0  . ondansetron (ZOFRAN) 8 MG tablet Take 1 tablet (8 mg total) by mouth 2 (two) times daily as needed. Start on the third day after chemotherapy. 30 tablet 1  . oxyCODONE (OXY IR/ROXICODONE) 5 MG immediate release tablet Take 1 tablet (5 mg total) by mouth every 6 (six) hours as needed for severe pain. 30 tablet 0  . pantoprazole (PROTONIX) 40 MG tablet TAKE 1 TABLET BY MOUTH TWICE A DAY 60 tablet 1  . polyethylene glycol (MIRALAX) packet Take 17 g by mouth daily. 30 each 0  . prochlorperazine (COMPAZINE) 10 MG tablet Take 1 tablet (10 mg total) by mouth every 6 (six) hours as needed (Nausea or vomiting). 30 tablet 1  . senna-docusate (SENOKOT-S) 8.6-50 MG tablet Take 2 tablets by mouth 2 (two) times daily. 30 tablet 0  . Vitamin D, Ergocalciferol, (DRISDOL) 50000 units CAPS capsule Take 50,000 Units by mouth once a week.  5   No current facility-administered medications for this visit.     PHYSICAL EXAMINATION: ECOG PERFORMANCE STATUS: 1 - Symptomatic but completely ambulatory  Vitals:   07/20/18 1348  BP: (!) 163/89  Pulse: 89  Resp: 18  Temp: 98.2 F (36.8 C)  SpO2: 98%   Filed Weights   07/20/18 1348  Weight: 137 lb 8 oz (62.4 kg)    GENERAL:alert, no distress and comfortable SKIN: skin color, texture, turgor are normal, no rashes or significant lesions EYES: normal, Conjunctiva are pink and non-injected, sclera clear OROPHARYNX:no exudate, no erythema and lips,  buccal mucosa, and tongue normal  NECK: supple, thyroid normal size, non-tender, without nodularity LYMPH:  no palpable lymphadenopathy in the cervical, axillary or inguinal LUNGS: clear to auscultation and percussion with normal breathing effort HEART: regular rate & rhythm and no murmurs and no lower extremity edema ABDOMEN:abdomen soft, non-tender and normal bowel sounds Musculoskeletal:no cyanosis of digits and no clubbing  NEURO: alert & oriented x 3 with fluent speech, no focal motor/sensory deficits  LABORATORY DATA:  I have reviewed the data as listed CBC Latest Ref Rng & Units 07/13/2018 06/28/2018 06/14/2018  WBC 4.0 - 10.5 K/uL 8.8 7.8 8.5  Hemoglobin 12.0 - 15.0 g/dL 7.9(L) 8.5(L) 8.8(L)  Hematocrit 36.0 - 46.0 % 24.9(L) 26.7(L) 27.6(L)  Platelets 150 - 400 K/uL 183 211 198     CMP Latest Ref Rng & Units 07/13/2018 06/28/2018 06/14/2018  Glucose 70 - 99 mg/dL 98 114(H) 127(H)  BUN 8 - 23 mg/dL 29(H) 13 16  Creatinine 0.44 - 1.00 mg/dL 5.73(HH) 3.25(HH) 3.91(HH)  Sodium 135 - 145 mmol/L 138 139 139  Potassium 3.5 - 5.1 mmol/L 5.5(H) 4.2 3.7  Chloride 98 - 111 mmol/L 102 100 99  CO2 22 - 32 mmol/L 26 31 30   Calcium 8.9 - 10.3  mg/dL 8.3(L) 8.3(L) 8.3(L)  Total Protein 6.5 - 8.1 g/dL 7.0 7.0 7.4  Total Bilirubin 0.3 - 1.2 mg/dL 0.3 0.2(L) 0.3  Alkaline Phos 38 - 126 U/L 143(H) 142(H) 147(H)  AST 15 - 41 U/L 13(L) 20 14(L)  ALT 0 - 44 U/L 9 17 9     RADIOGRAPHIC STUDIES: I have personally reviewed the radiological images as listed and agreed with the findings in the report. Ct Chest W Contrast  Result Date: 07/19/2018 CLINICAL DATA:  Intrahepatic cholangiocarcinoma with ongoing chemotherapy. Restaging. EXAM: CT CHEST, ABDOMEN, AND PELVIS WITH CONTRAST TECHNIQUE: Multidetector CT imaging of the chest, abdomen and pelvis was performed following the standard protocol during bolus administration of intravenous contrast. CONTRAST:  18mL OMNIPAQUE IOHEXOL 300 MG/ML  SOLN COMPARISON:   04/09/2018 CT abdomen/pelvis.  04/01/2018 chest CT. FINDINGS: CT CHEST FINDINGS Cardiovascular: Normal heart size. Small pericardial effusion, slightly increased. Right internal jugular Port-A-Cath terminates at the cavoatrial junction. Left anterior descending coronary atherosclerosis. Atherosclerotic nonaneurysmal thoracic aorta. Normal caliber pulmonary arteries. No central pulmonary emboli. Mediastinum/Nodes: Hypodense 1.1 cm right thyroid lobe nodule, not well evaluated on prior noncontrast CT. Unremarkable esophagus. No pathologically enlarged axillary, mediastinal or hilar lymph nodes. Lungs/Pleura: No pneumothorax. No pleural effusion. Moderate centrilobular emphysema with mild diffuse bronchial wall thickening. Solid 4 mm right upper lobe pulmonary nodule (series 11/image 48) and solid subpleural 4 mm peripheral right lower lobe nodule (series 11/image 107), both stable since 04/01/2018 chest CT. No acute consolidative airspace disease, lung masses or new significant pulmonary nodules. Musculoskeletal: No aggressive appearing focal osseous lesions. Mild thoracic spondylosis. CT ABDOMEN PELVIS FINDINGS Hepatobiliary: Irregular heterogeneous hypodense 6.0 x 5.0 cm inferior liver mass (series 7/image 114), previously 9.5 x 5.9 cm on 04/09/2018 noncontrast CT using similar measurement technique, decreased. Two clustered hypodense lesions in the far inferior right liver lobe, largest 0.9 cm (series 7/image 120), not discretely visualized on the prior noncontrast CT. Nondistended gallbladder. Cholelithiasis. Bile ducts are stable and within normal limits with CBD diameter 6 mm. Pancreas: Normal, with no mass or duct dilation. Spleen: Normal size. No mass. Adrenals/Urinary Tract: Normal adrenals. Nonobstructing interpolar left renal stones, largest 7 mm. No hydronephrosis. No renal masses. Normal nondistended bladder. Stomach/Bowel: Normal non-distended stomach. Normal caliber small bowel with no small bowel wall  thickening. Normal appendix. Oral contrast transits to the left colon. Moderate diffuse colonic diverticulosis, with no large bowel wall thickening or acute pericolonic fat stranding. Vascular/Lymphatic: Atherosclerotic nonaneurysmal abdominal aorta. Patent portal, splenic, hepatic and renal veins. Porta hepatis adenopathy up to 1.5 cm (series 7/image 105), mildly decreased from 1.9 cm. No new pathologically enlarged lymph nodes in the abdomen or pelvis. Reproductive: Grossly normal uterus.  No adnexal mass. Other: No pneumoperitoneum, ascites or focal fluid collection. Musculoskeletal: No aggressive appearing focal osseous lesions. Moderate lower lumbar spondylosis. IMPRESSION: 1. Dominant inferior liver mass is decreased in size. Two subcentimeter clustered low-attenuation lesions in the far inferior right liver lobe, not discretely visualized on the prior noncontrast CT study, can not exclude new small satellite hepatic metastases. 2. Porta hepatis adenopathy is mildly decreased. 3. Small right pulmonary nodules are stable. 4. No additional potential new or progressive metastatic disease. 5. Small pericardial effusion, slightly increased. 6. Cholelithiasis. 7. Moderate diffuse colonic diverticulosis. 8. Aortic Atherosclerosis (ICD10-I70.0) and Emphysema (ICD10-J43.9). Electronically Signed   By: Ilona Sorrel M.D.   On: 07/19/2018 14:55   Ct Abdomen Pelvis W Contrast  Result Date: 07/19/2018 CLINICAL DATA:  Intrahepatic cholangiocarcinoma with ongoing chemotherapy. Restaging. EXAM: CT  CHEST, ABDOMEN, AND PELVIS WITH CONTRAST TECHNIQUE: Multidetector CT imaging of the chest, abdomen and pelvis was performed following the standard protocol during bolus administration of intravenous contrast. CONTRAST:  122mL OMNIPAQUE IOHEXOL 300 MG/ML  SOLN COMPARISON:  04/09/2018 CT abdomen/pelvis.  04/01/2018 chest CT. FINDINGS: CT CHEST FINDINGS Cardiovascular: Normal heart size. Small pericardial effusion, slightly  increased. Right internal jugular Port-A-Cath terminates at the cavoatrial junction. Left anterior descending coronary atherosclerosis. Atherosclerotic nonaneurysmal thoracic aorta. Normal caliber pulmonary arteries. No central pulmonary emboli. Mediastinum/Nodes: Hypodense 1.1 cm right thyroid lobe nodule, not well evaluated on prior noncontrast CT. Unremarkable esophagus. No pathologically enlarged axillary, mediastinal or hilar lymph nodes. Lungs/Pleura: No pneumothorax. No pleural effusion. Moderate centrilobular emphysema with mild diffuse bronchial wall thickening. Solid 4 mm right upper lobe pulmonary nodule (series 11/image 48) and solid subpleural 4 mm peripheral right lower lobe nodule (series 11/image 107), both stable since 04/01/2018 chest CT. No acute consolidative airspace disease, lung masses or new significant pulmonary nodules. Musculoskeletal: No aggressive appearing focal osseous lesions. Mild thoracic spondylosis. CT ABDOMEN PELVIS FINDINGS Hepatobiliary: Irregular heterogeneous hypodense 6.0 x 5.0 cm inferior liver mass (series 7/image 114), previously 9.5 x 5.9 cm on 04/09/2018 noncontrast CT using similar measurement technique, decreased. Two clustered hypodense lesions in the far inferior right liver lobe, largest 0.9 cm (series 7/image 120), not discretely visualized on the prior noncontrast CT. Nondistended gallbladder. Cholelithiasis. Bile ducts are stable and within normal limits with CBD diameter 6 mm. Pancreas: Normal, with no mass or duct dilation. Spleen: Normal size. No mass. Adrenals/Urinary Tract: Normal adrenals. Nonobstructing interpolar left renal stones, largest 7 mm. No hydronephrosis. No renal masses. Normal nondistended bladder. Stomach/Bowel: Normal non-distended stomach. Normal caliber small bowel with no small bowel wall thickening. Normal appendix. Oral contrast transits to the left colon. Moderate diffuse colonic diverticulosis, with no large bowel wall thickening or  acute pericolonic fat stranding. Vascular/Lymphatic: Atherosclerotic nonaneurysmal abdominal aorta. Patent portal, splenic, hepatic and renal veins. Porta hepatis adenopathy up to 1.5 cm (series 7/image 105), mildly decreased from 1.9 cm. No new pathologically enlarged lymph nodes in the abdomen or pelvis. Reproductive: Grossly normal uterus.  No adnexal mass. Other: No pneumoperitoneum, ascites or focal fluid collection. Musculoskeletal: No aggressive appearing focal osseous lesions. Moderate lower lumbar spondylosis. IMPRESSION: 1. Dominant inferior liver mass is decreased in size. Two subcentimeter clustered low-attenuation lesions in the far inferior right liver lobe, not discretely visualized on the prior noncontrast CT study, can not exclude new small satellite hepatic metastases. 2. Porta hepatis adenopathy is mildly decreased. 3. Small right pulmonary nodules are stable. 4. No additional potential new or progressive metastatic disease. 5. Small pericardial effusion, slightly increased. 6. Cholelithiasis. 7. Moderate diffuse colonic diverticulosis. 8. Aortic Atherosclerosis (ICD10-I70.0) and Emphysema (ICD10-J43.9). Electronically Signed   By: Ilona Sorrel M.D.   On: 07/19/2018 14:55     07/19/2018 CT CAP W CONTRAST  IMPRESSION: 1. Dominant inferior liver mass is decreased in size. Two subcentimeter clustered low-attenuation lesions in the far inferior right liver lobe, not discretely visualized on the prior noncontrast CT study, can not exclude new small satellite hepatic metastases. 2. Porta hepatis adenopathy is mildly decreased. 3. Small right pulmonary nodules are stable. 4. No additional potential new or progressive metastatic disease. 5. Small pericardial effusion, slightly increased. 6. Cholelithiasis. 7. Moderate diffuse colonic diverticulosis. 8. Aortic Atherosclerosis (ICD10-I70.0) and Emphysema (ICD10-J43.9).  ASSESSMENT & PLAN:   Mary Chapman is a 74 y.o. female with history  of  1. Adenocarcinomainliver, likelyintrahepatic cholangiocarcinoma, cT2N1M0  stage IIIB, unresectable, MS-Stable -Diagnosed in 02/2018. She is not eligible for surgery due to tumor size and location.She was seen by Dr. Cloyd Stagers at [facility] -Currently onfirst lineCisplatin and Gemcitabineevery 2 weekssince 12/2019with dose reduction. - She is tolerating chemotherapy moderately will, with mild fatigue, moderate anemia that required blood transfusion but no other severe other side affects - Her abdominal pain has much improved lately  - She had a restaging CT CAP scan on 07/19/2018 and I reviewed the images with pt in person. The primary tumor and porta hepatis node has decreased significantly, indicating good response to chemo. Will continue  - She is tolerating chemotherapy well and wants to continue her treatment.   -F/u next week for treatment   2. HTN -Currently on metoprolol.  3. End Stage Renal Disease, on HD MWF -Treated with dialysis  -f/u with nephrologist Dr. Augustin Coupe  4. Anemia of chronic disease -Due to CKD and malignancy -She received blood transfusion after first cycle chemo and will consider blood transfusion when Hg <8.  - I previously recommended her to take multivitamins   5.Goal of care discussion  -The patient understands the goal of care is palliative, to prolong his life -she is full code now  Plan -labs and scans reviewed, she has had good partial response to chemo -good to proceed with treatmentwith cycle 5 cisplatin and gemcitabine next week and continue every 2 weeks  -f/u in 3 weeks     No problem-specific Assessment & Plan notes found for this encounter.   Orders Placed This Encounter  Procedures  . Sample to Blood Bank    Standing Status:   Future    Standing Expiration Date:   07/20/2019   All questions were answered. The patient knows to call the clinic with any problems, questions or concerns. No barriers to learning was detected. I  spent 25 minutes counseling the patient face to face. The total time spent in the appointment was 30 minutes and more than 50% was on counseling and review of test results  I, Manson Allan am acting as scribe for Dr. Truitt Merle.  I have reviewed the above documentation for accuracy and completeness, and I agree with the above.     Truitt Merle, MD 07/20/2018

## 2018-07-19 ENCOUNTER — Telehealth: Payer: Self-pay | Admitting: Hematology

## 2018-07-19 ENCOUNTER — Ambulatory Visit (HOSPITAL_COMMUNITY)
Admission: RE | Admit: 2018-07-19 | Discharge: 2018-07-19 | Disposition: A | Discharge status: Home / Self Care | Payer: Medicare Other | Source: Ambulatory Visit | Attending: Hematology | Admitting: Hematology

## 2018-07-19 ENCOUNTER — Encounter (HOSPITAL_COMMUNITY): Payer: Self-pay

## 2018-07-19 DIAGNOSIS — C221 Intrahepatic bile duct carcinoma: Secondary | ICD-10-CM | POA: Diagnosis not present

## 2018-07-19 HISTORY — DX: Malignant (primary) neoplasm, unspecified: C80.1

## 2018-07-19 HISTORY — DX: Disorder of kidney and ureter, unspecified: N28.9

## 2018-07-19 MED ORDER — SODIUM CHLORIDE (PF) 0.9 % IJ SOLN
INTRAMUSCULAR | Status: AC
  Filled 2018-07-19: qty 50

## 2018-07-19 MED ORDER — HEPARIN SOD (PORK) LOCK FLUSH 100 UNIT/ML IV SOLN
500.0000 [IU] | Freq: Once | INTRAVENOUS | Status: AC
  Administered 2018-07-19: 500 [IU] via INTRAVENOUS

## 2018-07-19 MED ORDER — HEPARIN SOD (PORK) LOCK FLUSH 100 UNIT/ML IV SOLN
INTRAVENOUS | Status: AC
  Filled 2018-07-19: qty 5

## 2018-07-19 MED ORDER — IOHEXOL 300 MG/ML  SOLN
100.0000 mL | Freq: Once | INTRAMUSCULAR | Status: AC | PRN
  Administered 2018-07-19: 100 mL via INTRAVENOUS

## 2018-07-19 NOTE — Telephone Encounter (Signed)
Called patient to inform patient that her treatment has been added for 3.  Patient aware of appt date and time.

## 2018-07-20 ENCOUNTER — Telehealth: Payer: Self-pay | Admitting: Hematology

## 2018-07-20 ENCOUNTER — Encounter: Payer: Self-pay | Admitting: Hematology

## 2018-07-20 ENCOUNTER — Inpatient Hospital Stay: Payer: Medicare Other | Attending: Hematology | Admitting: Hematology

## 2018-07-20 VITALS — BP 163/89 | HR 89 | Temp 98.2°F | Resp 18 | Ht 66.0 in | Wt 137.5 lb

## 2018-07-20 DIAGNOSIS — Z79899 Other long term (current) drug therapy: Secondary | ICD-10-CM | POA: Diagnosis not present

## 2018-07-20 DIAGNOSIS — J439 Emphysema, unspecified: Secondary | ICD-10-CM | POA: Diagnosis not present

## 2018-07-20 DIAGNOSIS — Z5111 Encounter for antineoplastic chemotherapy: Secondary | ICD-10-CM | POA: Diagnosis present

## 2018-07-20 DIAGNOSIS — N186 End stage renal disease: Secondary | ICD-10-CM | POA: Insufficient documentation

## 2018-07-20 DIAGNOSIS — I313 Pericardial effusion (noninflammatory): Secondary | ICD-10-CM | POA: Diagnosis not present

## 2018-07-20 DIAGNOSIS — Z992 Dependence on renal dialysis: Secondary | ICD-10-CM | POA: Diagnosis not present

## 2018-07-20 DIAGNOSIS — I10 Essential (primary) hypertension: Secondary | ICD-10-CM

## 2018-07-20 DIAGNOSIS — R599 Enlarged lymph nodes, unspecified: Secondary | ICD-10-CM | POA: Insufficient documentation

## 2018-07-20 DIAGNOSIS — K8010 Calculus of gallbladder with chronic cholecystitis without obstruction: Secondary | ICD-10-CM | POA: Diagnosis not present

## 2018-07-20 DIAGNOSIS — I120 Hypertensive chronic kidney disease with stage 5 chronic kidney disease or end stage renal disease: Secondary | ICD-10-CM | POA: Insufficient documentation

## 2018-07-20 DIAGNOSIS — C221 Intrahepatic bile duct carcinoma: Secondary | ICD-10-CM | POA: Insufficient documentation

## 2018-07-20 DIAGNOSIS — I700 Atherosclerosis of aorta: Secondary | ICD-10-CM | POA: Diagnosis not present

## 2018-07-20 DIAGNOSIS — K5730 Diverticulosis of large intestine without perforation or abscess without bleeding: Secondary | ICD-10-CM | POA: Diagnosis not present

## 2018-07-20 NOTE — Telephone Encounter (Signed)
Gave patient avs report and appointments for March and April. Patient has dialysis MWF and also lives out of town. For appointments the  week of 3/24 and 4/7 patient will have lab/fu late Monday afternoon and treatment Tuesday. When [name] returns from leave patient will have lab/fu/tx all same day on Tuesday 4/21. Patient/relative aware.

## 2018-07-27 ENCOUNTER — Inpatient Hospital Stay: Payer: Medicare Other

## 2018-07-27 ENCOUNTER — Other Ambulatory Visit: Payer: Self-pay | Admitting: Hematology

## 2018-07-27 ENCOUNTER — Other Ambulatory Visit: Payer: Self-pay | Admitting: *Deleted

## 2018-07-27 VITALS — BP 156/84 | HR 72 | Temp 97.9°F | Resp 18

## 2018-07-27 DIAGNOSIS — D638 Anemia in other chronic diseases classified elsewhere: Secondary | ICD-10-CM

## 2018-07-27 DIAGNOSIS — C221 Intrahepatic bile duct carcinoma: Secondary | ICD-10-CM

## 2018-07-27 DIAGNOSIS — D490 Neoplasm of unspecified behavior of digestive system: Secondary | ICD-10-CM

## 2018-07-27 DIAGNOSIS — Z5111 Encounter for antineoplastic chemotherapy: Secondary | ICD-10-CM | POA: Diagnosis not present

## 2018-07-27 DIAGNOSIS — Z95828 Presence of other vascular implants and grafts: Secondary | ICD-10-CM

## 2018-07-27 DIAGNOSIS — K6389 Other specified diseases of intestine: Secondary | ICD-10-CM

## 2018-07-27 LAB — CBC WITH DIFFERENTIAL (CANCER CENTER ONLY)
Abs Immature Granulocytes: 0.05 10*3/uL (ref 0.00–0.07)
BASOS ABS: 0 10*3/uL (ref 0.0–0.1)
Basophils Relative: 0 %
Eosinophils Absolute: 0.3 10*3/uL (ref 0.0–0.5)
Eosinophils Relative: 4 %
HEMATOCRIT: 24.7 % — AB (ref 36.0–46.0)
Hemoglobin: 7.9 g/dL — ABNORMAL LOW (ref 12.0–15.0)
Immature Granulocytes: 1 %
Lymphocytes Relative: 18 %
Lymphs Abs: 1.3 10*3/uL (ref 0.7–4.0)
MCH: 32.5 pg (ref 26.0–34.0)
MCHC: 32 g/dL (ref 30.0–36.0)
MCV: 101.6 fL — ABNORMAL HIGH (ref 80.0–100.0)
Monocytes Absolute: 0.8 10*3/uL (ref 0.1–1.0)
Monocytes Relative: 12 %
NRBC: 0 % (ref 0.0–0.2)
Neutro Abs: 4.7 10*3/uL (ref 1.7–7.7)
Neutrophils Relative %: 65 %
Platelet Count: 213 10*3/uL (ref 150–400)
RBC: 2.43 MIL/uL — AB (ref 3.87–5.11)
RDW: 17.6 % — ABNORMAL HIGH (ref 11.5–15.5)
WBC Count: 7.2 10*3/uL (ref 4.0–10.5)

## 2018-07-27 LAB — PREPARE RBC (CROSSMATCH)

## 2018-07-27 LAB — CMP (CANCER CENTER ONLY)
ALT: 6 U/L (ref 0–44)
AST: 12 U/L — ABNORMAL LOW (ref 15–41)
Albumin: 2.8 g/dL — ABNORMAL LOW (ref 3.5–5.0)
Alkaline Phosphatase: 129 U/L — ABNORMAL HIGH (ref 38–126)
Anion gap: 13 (ref 5–15)
BILIRUBIN TOTAL: 0.3 mg/dL (ref 0.3–1.2)
BUN: 30 mg/dL — ABNORMAL HIGH (ref 8–23)
CO2: 26 mmol/L (ref 22–32)
Calcium: 8 mg/dL — ABNORMAL LOW (ref 8.9–10.3)
Chloride: 99 mmol/L (ref 98–111)
Creatinine: 5.74 mg/dL (ref 0.44–1.00)
GFR, Est AFR Am: 8 mL/min — ABNORMAL LOW (ref >60)
GFR, Estimated: 7 mL/min — ABNORMAL LOW (ref >60)
Glucose, Bld: 92 mg/dL (ref 70–99)
Potassium: 3.8 mmol/L (ref 3.5–5.1)
Sodium: 138 mmol/L (ref 135–145)
TOTAL PROTEIN: 6.7 g/dL (ref 6.5–8.1)

## 2018-07-27 LAB — SAMPLE TO BLOOD BANK

## 2018-07-27 MED ORDER — SODIUM CHLORIDE 0.9 % IV SOLN
500.0000 mg/m2 | Freq: Once | INTRAVENOUS | Status: AC
  Administered 2018-07-27: 836 mg via INTRAVENOUS
  Filled 2018-07-27: qty 21.99

## 2018-07-27 MED ORDER — SODIUM CHLORIDE 0.9 % IV SOLN
12.5000 mg/m2 | Freq: Once | INTRAVENOUS | Status: AC
  Administered 2018-07-27: 21 mg via INTRAVENOUS
  Filled 2018-07-27: qty 21

## 2018-07-27 MED ORDER — PALONOSETRON HCL INJECTION 0.25 MG/5ML
0.2500 mg | Freq: Once | INTRAVENOUS | Status: AC
  Administered 2018-07-27: 0.25 mg via INTRAVENOUS

## 2018-07-27 MED ORDER — HEPARIN SOD (PORK) LOCK FLUSH 100 UNIT/ML IV SOLN
500.0000 [IU] | Freq: Once | INTRAVENOUS | Status: AC | PRN
  Administered 2018-07-27: 500 [IU]
  Filled 2018-07-27: qty 5

## 2018-07-27 MED ORDER — SODIUM CHLORIDE 0.9% FLUSH
10.0000 mL | INTRAVENOUS | Status: DC | PRN
  Administered 2018-07-27: 10 mL
  Filled 2018-07-27: qty 10

## 2018-07-27 MED ORDER — SODIUM CHLORIDE 0.9 % IV SOLN
Freq: Once | INTRAVENOUS | Status: AC
  Administered 2018-07-27: 14:00:00 via INTRAVENOUS
  Filled 2018-07-27: qty 250

## 2018-07-27 MED ORDER — SODIUM CHLORIDE 0.9 % IV SOLN
Freq: Once | INTRAVENOUS | Status: AC
  Administered 2018-07-27: 15:00:00 via INTRAVENOUS
  Filled 2018-07-27: qty 5

## 2018-07-27 MED ORDER — PALONOSETRON HCL INJECTION 0.25 MG/5ML
INTRAVENOUS | Status: AC
  Filled 2018-07-27: qty 5

## 2018-07-27 NOTE — Patient Instructions (Signed)
Cancer Center Discharge Instructions for Patients Receiving Chemotherapy  Today you received the following chemotherapy agents Gemzar, Cisplatin  To help prevent nausea and vomiting after your treatment, we encourage you to take your nausea medication as directed   If you develop nausea and vomiting that is not controlled by your nausea medication, call the clinic.   BELOW ARE SYMPTOMS THAT SHOULD BE REPORTED IMMEDIATELY:  *FEVER GREATER THAN 100.5 F  *CHILLS WITH OR WITHOUT FEVER  NAUSEA AND VOMITING THAT IS NOT CONTROLLED WITH YOUR NAUSEA MEDICATION  *UNUSUAL SHORTNESS OF BREATH  *UNUSUAL BRUISING OR BLEEDING  TENDERNESS IN MOUTH AND THROAT WITH OR WITHOUT PRESENCE OF ULCERS  *URINARY PROBLEMS  *BOWEL PROBLEMS  UNUSUAL RASH Items with * indicate a potential emergency and should be followed up as soon as possible.  Feel free to call the clinic should you have any questions or concerns. The clinic phone number is [phone].  Please show the CHEMO ALERT CARD at check-in to the Emergency Department and triage nurse.   

## 2018-07-27 NOTE — Progress Notes (Unsigned)
Per Dr. Burr Medico, patient OK to treat with today's labs

## 2018-07-28 ENCOUNTER — Telehealth: Payer: Self-pay

## 2018-07-28 ENCOUNTER — Other Ambulatory Visit: Payer: Medicare Other

## 2018-07-28 ENCOUNTER — Ambulatory Visit: Payer: Medicare Other

## 2018-07-28 NOTE — Telephone Encounter (Signed)
Spoke with patient to let her know she has an appointment tomorrow 3/12 at 8:00 am at Patient Care Unit for one unit of blood, patient verbalized an understanding and is familiar with how to get to the unit in [facility].

## 2018-07-29 ENCOUNTER — Other Ambulatory Visit: Payer: Self-pay

## 2018-07-29 ENCOUNTER — Ambulatory Visit (HOSPITAL_COMMUNITY)
Admission: RE | Admit: 2018-07-29 | Discharge: 2018-07-29 | Disposition: A | Discharge status: Home / Self Care | Payer: Medicare Other | Source: Ambulatory Visit | Attending: Hematology | Admitting: Hematology

## 2018-07-29 DIAGNOSIS — D638 Anemia in other chronic diseases classified elsewhere: Secondary | ICD-10-CM | POA: Insufficient documentation

## 2018-07-29 DIAGNOSIS — C221 Intrahepatic bile duct carcinoma: Secondary | ICD-10-CM | POA: Insufficient documentation

## 2018-07-29 MED ORDER — SODIUM CHLORIDE 0.9% FLUSH
10.0000 mL | INTRAVENOUS | Status: AC | PRN
  Administered 2018-07-29: 10 mL

## 2018-07-29 MED ORDER — SODIUM CHLORIDE 0.9% IV SOLUTION
250.0000 mL | Freq: Once | INTRAVENOUS | Status: AC
  Administered 2018-07-29: 250 mL via INTRAVENOUS

## 2018-07-29 MED ORDER — HEPARIN SOD (PORK) LOCK FLUSH 100 UNIT/ML IV SOLN
500.0000 [IU] | Freq: Every day | INTRAVENOUS | Status: AC | PRN
  Administered 2018-07-29: 500 [IU]
  Filled 2018-07-29: qty 5

## 2018-07-29 NOTE — Discharge Instructions (Signed)
Blood Transfusion, Adult, Care After This sheet gives you information about how to care for yourself after your procedure. Your doctor may also give you more specific instructions. If you have problems or questions, contact your doctor. Follow these instructions at home:   Take over-the-counter and prescription medicines only as told by your doctor.  Go back to your normal activities as told by your doctor.  Follow instructions from your doctor about how to take care of the area where an IV tube was put into your vein (insertion site). Make sure you: ? Wash your hands with soap and water before you change your bandage (dressing). If there is no soap and water, use hand sanitizer. ? Change your bandage as told by your doctor.  Check your IV insertion site every day for signs of infection. Check for: ? More redness, swelling, or pain. ? More fluid or blood. ? Warmth. ? Pus or a bad smell. Contact a doctor if:  You have more redness, swelling, or pain around the IV insertion site.  You have more fluid or blood coming from the IV insertion site.  Your IV insertion site feels warm to the touch.  You have pus or a bad smell coming from the IV insertion site.  Your pee (urine) turns pink, red, or brown.  You feel weak after doing your normal activities. Get help right away if:  You have signs of a serious allergic or body defense (immune) system reaction, including: ? Itchiness. ? Hives. ? Trouble breathing. ? Anxiety. ? Pain in your chest or lower back. ? Fever, flushing, and chills. ? Fast pulse. ? Rash. ? Watery poop (diarrhea). ? Throwing up (vomiting). ? Dark pee. ? Serious headache. ? Dizziness. ? Stiff neck. ? Yellow color in your face or the white parts of your eyes (jaundice). Summary  After a blood transfusion, return to your normal activities as told by your doctor.  Every day, check for signs of infection where the IV tube was put into your vein.  Some  signs of infection are warm skin, more redness and pain, more fluid or blood, and pus or a bad smell where the needle went in.  Contact your doctor if you feel weak or have any unusual symptoms. This information is not intended to replace advice given to you by your health care provider. Make sure you discuss any questions you have with your health care provider. Document Released: 05/26/2014 Document Revised: 12/28/2015 Document Reviewed: 12/28/2015 Elsevier Interactive Patient Education  2019 [employer]  

## 2018-07-29 NOTE — Progress Notes (Signed)
PATIENT CARE CENTER NOTE  Diagnosis: Anemia of Chronic Disease    Provider: Truitt Merle, MD   Procedure: 1 unit PRBC   Note: Patient received 1 unit of blood. Tolerated transfusion well with no adverse reaction. BP elevated pre-transfusion and throughout transfusion. Dr. Ernestina Penna nurse notified. Discharge instructions given. Patient alert, oriented and ambulatory at discharge.

## 2018-07-30 LAB — TYPE AND SCREEN
ABO/RH(D): O POS
Antibody Screen: NEGATIVE
Unit division: 0

## 2018-07-30 LAB — BPAM RBC
Blood Product Expiration Date: 202004062359
ISSUE DATE / TIME: 202003120909
Unit Type and Rh: 5100

## 2018-08-06 NOTE — Progress Notes (Signed)
[place]   [phone] [phone]   Clinic Follow up Note   Patient Care Team: Glendon Axe, MD as PCP - General (Family Medicine) [name], MD as PCP - Cardiology (Cardiology) Dwana Melena, MD as Attending Physician (Nephrology) Truitt Merle, MD as Consulting Physician (Hematology)  Date of Service:  08/09/2018  CHIEF COMPLAINT: F/u on intrahepatic cholangiocarcinoma   SUMMARY OF ONCOLOGIC HISTORY:   Intrahepatic cholangiocarcinoma ([place])   02/23/2018 Imaging    CT AP W Contrast 02/23/18  IMPRESSION: 1. Large multi-cystic lesion in the central aspect of the liver adjacent to the gallbladder fossa, with several smaller satellite lesions. These are all new compared to prior CT the chest, abdomen and pelvis 08/07/2016, concerning for intrahepatic abscesses. 2. Extensive mural thickening and inflammatory changes in the region of the cecum. This is nonspecific, and could reflect either right-sided diverticulitis, focal area of colitis, or potentially even underlying neoplasm. 3. Cholelithiasis. Gallbladder is nearly completely contracted without surrounding inflammatory changes to suggest an acute cholecystitis at this time. 4. Left-sided nephrolithiasis measuring up to 8 mm in the upper pole collecting system of left kidney. No ureteral stones or findings of urinary tract obstruction. 5. Aortic atherosclerosis.    02/27/2018 Initial Biopsy    Diagnosis 02/27/18  Liver, needle/core biopsy, Right Hepatic Lobe - ADENOCARCINOMA. SEE NOTE.    02/27/2018 Miscellaneous       03/05/2018 Procedure    Colonoscopy 03/05/18  IMPRESSION - Preparation of the colon was fair. - Non-thrombosed external hemorrhoids found on digital rectal exam. - There was significant looping of the colon. - Severe diverticulosis in the recto-sigmoid colon, in the sigmoid colon, in the descending colon, in the transverse colon, at the hepatic flexure, in the  ascending colon and in the cecum. There was no evidence of diverticular bleeding. - A single (solitary) ulcer in the cecum - highly concerning for underlying malignancy. Biopsied. Phlegmonous change is possible, though often in setting of complicated diverticulosis/diverticulitis ulceration would not be as common. - Erythematous mucosa in the transverse colon, at the hepatic flexure, in the ascending colon and in the cecum. Biopsied. - Normal mucosa in the rectum, in the recto-sigmoid colon, in the sigmoid colon and in the descending colon. Biopsied. - Non-bleeding non-thrombosed external and internal hemorrhoids. -----Negative for malignancy     03/11/2018 Initial Diagnosis    Intrahepatic cholangiocarcinoma ([place])    04/02/2018 Imaging    CT CAP W contrast 04/02/18  IMPRESSION: 1. 4 mm subpleural nodule in the periphery of the right lower lobe. This is nonspecific, but strongly favored to represent a benign subpleural lymph node. Attention on follow-up studies is recommended to exclude the possibility of metastatic disease. 2. Diffuse bronchial wall thickening with moderate centrilobular and mild paraseptal emphysema; imaging findings suggestive of underlying COPD. 3. Aortic atherosclerosis, in addition to left main and left anterior descending coronary artery disease. Assessment for potential risk factor modification, dietary therapy or pharmacologic therapy may be warranted, if clinically indicated. 4. Nonobstructive calculi in the upper pole collecting system of left kidney measuring up to 7 mm.     04/27/2018 Cancer Staging    Staging form: Intrahepatic Bile Duct, AJCC 8th Edition - Clinical: Stage IIIB (cT2, cN1, cM0) - Signed by Truitt Merle, MD on 04/27/2018    05/18/2018 -  Chemotherapy    Gemcitabine and Cisplatin every 2 weeksstarting on 05/18/2018, with 50% dose reduction    07/19/2018 Imaging    CT CAP W CONTRAST  IMPRESSION:  1. Dominant inferior liver mass is  decreased in size. Two subcentimeter clustered low-attenuation lesions in the far inferior right liver lobe, not discretely visualized on the prior noncontrast CT study, can not exclude new small satellite hepatic metastases. 2. Porta hepatis adenopathy is mildly decreased. 3. Small right pulmonary nodules are stable. 4. No additional potential new or progressive metastatic disease. 5. Small pericardial effusion, slightly increased. 6. Cholelithiasis. 7. Moderate diffuse colonic diverticulosis. 8. Aortic Atherosclerosis (ICD10-I70.0) and Emphysema (ICD10-J43.9).       CURRENT THERAPY:  Gemcitabine and Cisplatin every 2 weeksstartingon 05/18/2018, with dose reduction  INTERVAL HISTORY:  Mary Chapman is here for a follow up of treatment. She presents to the clinic today by herself.  She was really been nauseous during dialysis this morning.  Feels better now.  She overall has been doing well lately, her abdominal pain has resolved, her appetite and energy level are decent.  She feels better after blood transfusion 2 weeks ago.  She denies any signs of bleeding.  Bowel movements normal, she has no other complaints.    REVIEW OF SYSTEMS:   Constitutional: Denies fevers, chills or abnormal weight loss, (+) mild fatigue  Eyes: Denies blurriness of vision Ears, nose, mouth, throat, and face: Denies mucositis or sore throat Respiratory: Denies cough, dyspnea or wheezes Cardiovascular: Denies palpitation, chest discomfort or lower extremity swelling Gastrointestinal:  Denies nausea, heartburn or change in bowel habits Skin: Denies abnormal skin rashes Lymphatics: Denies new lymphadenopathy or easy bruising Neurological:Denies numbness, tingling or new weaknesses Behavioral/Psych: Mood is stable, no new changes  All other systems were reviewed with the patient and are negative.  MEDICAL HISTORY:  Past Medical History:  Diagnosis Date  . Diverticulitis   . Focal segmental  glomerulosclerosis    ESRD on MWF HD  . Hypertension   . intrahepatic cholangio ca dx'd10/2019  . Renal insufficiency    dialysis pt MWF  . Tobacco abuse     SURGICAL HISTORY: Past Surgical History:  Procedure Laterality Date  . AV FISTULA PLACEMENT Left 03/18/2017   Procedure: Brachiocephalic ARTERIOVENOUS (AV) FISTULA CREATION left arm;  Surgeon: Elam Dutch, MD;  Location: [facility];  Service: Vascular;  Laterality: Left;  . BIOPSY  03/05/2018   Procedure: BIOPSY;  Surgeon: Rush Landmark Telford Nab., MD;  Location: [facility];  Service: Gastroenterology;;  enteroscopy bx /cytology brushing Greig Castilla bx  . COLONOSCOPY WITH PROPOFOL N/A 03/05/2018   Procedure: COLONOSCOPY WITH PROPOFOL;  Surgeon: Rush Landmark Telford Nab., MD;  Location: [facility];  Service: Gastroenterology;  Laterality: N/A;  Bx, Spot  . ENTEROSCOPY N/A 03/05/2018   Procedure: ENTEROSCOPY;  Surgeon: Rush Landmark Telford Nab., MD;  Location: [facility];  Service: Gastroenterology;  Laterality: N/A;  BX, Brushing, & Spot  . INSERTION OF DIALYSIS CATHETER N/A 03/18/2017   Procedure: INSERTION OF DIALYSIS CATHETER;  Surgeon: Elam Dutch, MD;  Location: [facility];  Service: Vascular;  Laterality: N/A;  . IR GENERIC HISTORICAL  08/15/2016   IR US GUIDE VASC ACCESS RIGHT 08/15/2016 Arne Cleveland, MD [facility]  . IR GENERIC HISTORICAL  08/15/2016   IR FLUORO GUIDE CV LINE RIGHT 08/15/2016 Arne Cleveland, MD [facility]  . IR IMAGING GUIDED PORT INSERTION  05/06/2018  . SUBMUCOSAL INJECTION  03/05/2018   Procedure: SUBMUCOSAL INJECTION;  Surgeon: Rush Landmark Telford Nab., MD;  Location: [facility];  Service: Gastroenterology;;  spot tatoo  . TUBAL LIGATION      I have reviewed the social history and family history with the patient and  they are unchanged from previous note.  ALLERGIES:  has No Known Allergies.  MEDICATIONS:  Current Outpatient Medications  Medication Sig Dispense Refill  . folic acid (FOLVITE) 1  MG tablet Take 1 tablet (1 mg total) by mouth daily. 30 tablet 0  . lidocaine-prilocaine (EMLA) cream Apply to affected area once 30 g 3  . metoprolol tartrate (LOPRESSOR) 25 MG tablet Take 0.5 tablets (12.5 mg total) by mouth 2 (two) times daily. 30 tablet 0  . multivitamin (RENA-VIT) TABS tablet Take 1 tablet by mouth at bedtime. 30 tablet 3  . nicotine (NICODERM CQ - DOSED IN MG/24 HOURS) 21 mg/24hr patch Place 1 patch (21 mg total) onto the skin daily. 28 patch 0  . Nutritional Supplements (FEEDING SUPPLEMENT, NEPRO CARB STEADY,) LIQD Take 237 mLs by mouth 3 (three) times daily as needed (Supplement). 90 Can 0  . ondansetron (ZOFRAN) 8 MG tablet Take 1 tablet (8 mg total) by mouth 2 (two) times daily as needed. Start on the third day after chemotherapy. 30 tablet 1  . oxyCODONE (OXY IR/ROXICODONE) 5 MG immediate release tablet Take 1 tablet (5 mg total) by mouth every 6 (six) hours as needed for severe pain. 30 tablet 0  . pantoprazole (PROTONIX) 40 MG tablet TAKE 1 TABLET BY MOUTH TWICE A DAY 60 tablet 1  . polyethylene glycol (MIRALAX) packet Take 17 g by mouth daily. 30 each 0  . prochlorperazine (COMPAZINE) 10 MG tablet Take 1 tablet (10 mg total) by mouth every 6 (six) hours as needed (Nausea or vomiting). 30 tablet 1  . senna-docusate (SENOKOT-S) 8.6-50 MG tablet Take 2 tablets by mouth 2 (two) times daily. 30 tablet 0  . Vitamin D, Ergocalciferol, (DRISDOL) 50000 units CAPS capsule Take 50,000 Units by mouth once a week.  5   No current facility-administered medications for this visit.    Facility-Administered Medications Ordered in Other Visits  Medication Dose Route Frequency Provider Last Rate Last Dose  . sodium chloride flush (NS) 0.9 % injection 10 mL  10 mL Intracatheter PRN Truitt Merle, MD   10 mL at 07/27/18 1745    PHYSICAL EXAMINATION: ECOG PERFORMANCE STATUS: 1 - Symptomatic but completely ambulatory  Vitals:   08/09/18 1456  BP: (!) 162/86  Pulse: 78  Resp: 18   Temp: 98.2 F (36.8 C)  SpO2: 100%   Filed Weights   08/09/18 1456  Weight: 136 lb 6.4 oz (61.9 kg)    GENERAL:alert, no distress and comfortable SKIN: skin color, texture, turgor are normal, no rashes or significant lesions EYES: normal, Conjunctiva are pink and non-injected, sclera clear OROPHARYNX:no exudate, no erythema and lips, buccal mucosa, and tongue normal, lips appear to be dry NECK: supple, thyroid normal size, non-tender, without nodularity LYMPH:  no palpable lymphadenopathy in the cervical, axillary or inguinal LUNGS: clear to auscultation and percussion with normal breathing effort HEART: regular rate & rhythm and no murmurs and no lower extremity edema ABDOMEN:abdomen soft, non-tender and normal bowel sounds Musculoskeletal:no cyanosis of digits and no clubbing  NEURO: alert & oriented x 3 with fluent speech, no focal motor/sensory deficits  LABORATORY DATA:  I have reviewed the data as listed CBC Latest Ref Rng & Units 08/09/2018 07/27/2018 07/13/2018  WBC 4.0 - 10.5 K/uL 7.8 7.2 8.8  Hemoglobin 12.0 - 15.0 g/dL 9.5(L) 7.9(L) 7.9(L)  Hematocrit 36.0 - 46.0 % 29.0(L) 24.7(L) 24.9(L)  Platelets 150 - 400 K/uL 190 213 183     CMP Latest Ref Rng & Units 07/27/2018  07/13/2018 06/28/2018  Glucose 70 - 99 mg/dL 92 98 114(H)  BUN 8 - 23 mg/dL 30(H) 29(H) 13  Creatinine 0.44 - 1.00 mg/dL 5.74(HH) 5.73(HH) 3.25(HH)  Sodium 135 - 145 mmol/L 138 138 139  Potassium 3.5 - 5.1 mmol/L 3.8 5.5(H) 4.2  Chloride 98 - 111 mmol/L 99 102 100  CO2 22 - 32 mmol/L 26 26 31   Calcium 8.9 - 10.3 mg/dL 8.0(L) 8.3(L) 8.3(L)  Total Protein 6.5 - 8.1 g/dL 6.7 7.0 7.0  Total Bilirubin 0.3 - 1.2 mg/dL 0.3 0.3 0.2(L)  Alkaline Phos 38 - 126 U/L 129(H) 143(H) 142(H)  AST 15 - 41 U/L 12(L) 13(L) 20  ALT 0 - 44 U/L 6 9 17       RADIOGRAPHIC STUDIES: I have personally reviewed the radiological images as listed and agreed with the findings in the report. No results found.   ASSESSMENT &  PLAN:  Mary Chapman is a 73 y.o. female with   1. Adenocarcinomainliver, likelyintrahepatic cholangiocarcinoma, cT2N1M0 stage IIIB, unresectable, MS-Stable -Diagnosed in 02/2018. She is not eligible for surgery due to tumor size and location.She was [facility] by [facility] -Currently onfirst lineCisplatin and Gemcitabineevery 2 weekssince 12/2019with dose reduction. -She is tolerating chemotherapy moderately will, with mild fatigue, moderate anemia that required blood transfusion but no other severe other side affects -Her abdominal pain has resolved lately, indicating good response to chemotherapy. -She is clinically doing well, tolerating chemotherapy very well, lab reviewed, anemia improved after blood transfusion, CMP still pending, overall adequate for chemotherapy tomorrow -Continue chemo every 2 weeks, next restaging scan in 3-4 months.  Due to her recent culprit 82 outbreak, as long as she is clinically doing well, tumor marker stable, okay to wait a little longer for next restaging scan. -I will see her back in 2 weeks   2. HTN  -Currently on metoprolol. -BP slightly high, f/u with nephology and PCP  3. End Stage Renal Disease, on HD MWF -Treated with dialysis  -f/u with nephrologist Dr. Augustin Coupe  4. Anemia of chronic diseaseand chemo induced anemia  -Due to CKD and malignancy -She received blood transfusion after first cycle chemo and will consider blood transfusion when Hg <8. Due to the outbreak of COVID 19, blood supply has been low lately, will hold blood transfusion until hemoglobin less than 7 -We discussed the role of EPO for anemia, if she requires frequent blood transfusion.  The potential benefit and side effects, especially the small risk of tumor growth from EPO, were discussed with patient.  She voiced good understanding, and agrees with EPO if she really needs it.  -I previously recommended her to take multivitamins  5.Goal of care discussion  -The  patient understands the goal of care is palliative, to prolong his life -she is full code now  Plan -lab reviewed, adequate for treatment, will proceed chemotherapy cisplatin and gemcitabine tomorrow -Lab, follow-up and chemo in 2 weeks   No problem-specific Assessment & Plan notes found for this encounter.   No orders of the defined types were placed in this encounter.  All questions were answered. The patient knows to call the clinic with any problems, questions or concerns. No barriers to learning was detected. I spent 15 minutes counseling the patient face to face. The total time spent in the appointment was 20 minutes and more than 50% was on counseling and review of test results     Truitt Merle, MD 08/09/2018   I, Joslyn Devon, am acting as scribe for  Truitt Merle, MD.   I have reviewed the above documentation for accuracy and completeness, and I agree with the above.

## 2018-08-09 ENCOUNTER — Inpatient Hospital Stay: Payer: Medicare Other

## 2018-08-09 ENCOUNTER — Other Ambulatory Visit: Payer: Self-pay | Admitting: Hematology

## 2018-08-09 ENCOUNTER — Other Ambulatory Visit: Payer: Self-pay

## 2018-08-09 ENCOUNTER — Inpatient Hospital Stay: Payer: Medicare Other | Admitting: Hematology

## 2018-08-09 ENCOUNTER — Encounter: Payer: Self-pay | Admitting: Hematology

## 2018-08-09 VITALS — BP 162/86 | HR 78 | Temp 98.2°F | Resp 18 | Ht 66.0 in | Wt 136.4 lb

## 2018-08-09 DIAGNOSIS — N186 End stage renal disease: Secondary | ICD-10-CM | POA: Diagnosis not present

## 2018-08-09 DIAGNOSIS — Z992 Dependence on renal dialysis: Secondary | ICD-10-CM

## 2018-08-09 DIAGNOSIS — Z79899 Other long term (current) drug therapy: Secondary | ICD-10-CM

## 2018-08-09 DIAGNOSIS — D6481 Anemia due to antineoplastic chemotherapy: Secondary | ICD-10-CM

## 2018-08-09 DIAGNOSIS — D490 Neoplasm of unspecified behavior of digestive system: Secondary | ICD-10-CM

## 2018-08-09 DIAGNOSIS — Z5111 Encounter for antineoplastic chemotherapy: Secondary | ICD-10-CM | POA: Diagnosis not present

## 2018-08-09 DIAGNOSIS — K6389 Other specified diseases of intestine: Secondary | ICD-10-CM

## 2018-08-09 DIAGNOSIS — I120 Hypertensive chronic kidney disease with stage 5 chronic kidney disease or end stage renal disease: Secondary | ICD-10-CM | POA: Diagnosis not present

## 2018-08-09 DIAGNOSIS — Z95828 Presence of other vascular implants and grafts: Secondary | ICD-10-CM

## 2018-08-09 DIAGNOSIS — I10 Essential (primary) hypertension: Secondary | ICD-10-CM

## 2018-08-09 DIAGNOSIS — C221 Intrahepatic bile duct carcinoma: Secondary | ICD-10-CM

## 2018-08-09 LAB — CMP (CANCER CENTER ONLY)
ALT: 7 U/L (ref 0–44)
ANION GAP: 13 (ref 5–15)
AST: 12 U/L — ABNORMAL LOW (ref 15–41)
Albumin: 3 g/dL — ABNORMAL LOW (ref 3.5–5.0)
Alkaline Phosphatase: 139 U/L — ABNORMAL HIGH (ref 38–126)
BUN: 31 mg/dL — ABNORMAL HIGH (ref 8–23)
CHLORIDE: 98 mmol/L (ref 98–111)
CO2: 29 mmol/L (ref 22–32)
Calcium: 8.2 mg/dL — ABNORMAL LOW (ref 8.9–10.3)
Creatinine: 4.25 mg/dL (ref 0.44–1.00)
GFR, EST NON AFRICAN AMERICAN: 10 mL/min — AB (ref >60)
GFR, Est AFR Am: 11 mL/min — ABNORMAL LOW (ref >60)
Glucose, Bld: 111 mg/dL — ABNORMAL HIGH (ref 70–99)
Potassium: 4.1 mmol/L (ref 3.5–5.1)
Sodium: 140 mmol/L (ref 135–145)
Total Bilirubin: 0.3 mg/dL (ref 0.3–1.2)
Total Protein: 7.3 g/dL (ref 6.5–8.1)

## 2018-08-09 LAB — CBC WITH DIFFERENTIAL (CANCER CENTER ONLY)
Abs Immature Granulocytes: 0.03 10*3/uL (ref 0.00–0.07)
BASOS ABS: 0 10*3/uL (ref 0.0–0.1)
Basophils Relative: 0 %
Eosinophils Absolute: 0.3 10*3/uL (ref 0.0–0.5)
Eosinophils Relative: 4 %
HCT: 29 % — ABNORMAL LOW (ref 36.0–46.0)
Hemoglobin: 9.5 g/dL — ABNORMAL LOW (ref 12.0–15.0)
IMMATURE GRANULOCYTES: 0 %
Lymphocytes Relative: 11 %
Lymphs Abs: 0.9 10*3/uL (ref 0.7–4.0)
MCH: 32.6 pg (ref 26.0–34.0)
MCHC: 32.8 g/dL (ref 30.0–36.0)
MCV: 99.7 fL (ref 80.0–100.0)
Monocytes Absolute: 0.7 10*3/uL (ref 0.1–1.0)
Monocytes Relative: 9 %
NEUTROS PCT: 76 %
Neutro Abs: 5.9 10*3/uL (ref 1.7–7.7)
Platelet Count: 190 10*3/uL (ref 150–400)
RBC: 2.91 MIL/uL — AB (ref 3.87–5.11)
RDW: 16.2 % — ABNORMAL HIGH (ref 11.5–15.5)
WBC Count: 7.8 10*3/uL (ref 4.0–10.5)
nRBC: 0 % (ref 0.0–0.2)

## 2018-08-09 MED ORDER — SODIUM CHLORIDE 0.9% FLUSH
10.0000 mL | INTRAVENOUS | Status: DC | PRN
  Administered 2018-08-09: 10 mL
  Filled 2018-08-09: qty 10

## 2018-08-09 MED ORDER — HEPARIN SOD (PORK) LOCK FLUSH 100 UNIT/ML IV SOLN
500.0000 [IU] | Freq: Once | INTRAVENOUS | Status: AC | PRN
  Administered 2018-08-09: 500 [IU]
  Filled 2018-08-09: qty 5

## 2018-08-09 NOTE — Addendum Note (Signed)
Addended by: Truitt Merle on: 08/09/2018 06:13 PM   Modules accepted: Orders

## 2018-08-10 ENCOUNTER — Telehealth: Payer: Self-pay | Admitting: Hematology

## 2018-08-10 ENCOUNTER — Inpatient Hospital Stay: Payer: Medicare Other

## 2018-08-10 ENCOUNTER — Other Ambulatory Visit: Payer: Self-pay

## 2018-08-10 VITALS — BP 153/83 | HR 69 | Temp 98.3°F | Resp 18

## 2018-08-10 DIAGNOSIS — Z5111 Encounter for antineoplastic chemotherapy: Secondary | ICD-10-CM | POA: Diagnosis not present

## 2018-08-10 DIAGNOSIS — C221 Intrahepatic bile duct carcinoma: Secondary | ICD-10-CM

## 2018-08-10 MED ORDER — SODIUM CHLORIDE 0.9 % IV SOLN
Freq: Once | INTRAVENOUS | Status: AC
  Administered 2018-08-10: 10:00:00 via INTRAVENOUS
  Filled 2018-08-10: qty 250

## 2018-08-10 MED ORDER — SODIUM CHLORIDE 0.9 % IV SOLN
500.0000 mg/m2 | Freq: Once | INTRAVENOUS | Status: AC
  Administered 2018-08-10: 836 mg via INTRAVENOUS
  Filled 2018-08-10: qty 21.99

## 2018-08-10 MED ORDER — SODIUM CHLORIDE 0.9 % IV SOLN
Freq: Once | INTRAVENOUS | Status: AC
  Administered 2018-08-10: 10:00:00 via INTRAVENOUS
  Filled 2018-08-10: qty 5

## 2018-08-10 MED ORDER — PALONOSETRON HCL INJECTION 0.25 MG/5ML
0.2500 mg | Freq: Once | INTRAVENOUS | Status: AC
  Administered 2018-08-10: 0.25 mg via INTRAVENOUS

## 2018-08-10 MED ORDER — PALONOSETRON HCL INJECTION 0.25 MG/5ML
INTRAVENOUS | Status: AC
  Filled 2018-08-10: qty 5

## 2018-08-10 MED ORDER — SODIUM CHLORIDE 0.9% FLUSH
10.0000 mL | INTRAVENOUS | Status: DC | PRN
  Administered 2018-08-10: 10 mL
  Filled 2018-08-10: qty 10

## 2018-08-10 MED ORDER — SODIUM CHLORIDE 0.9 % IV SOLN
12.5000 mg/m2 | Freq: Once | INTRAVENOUS | Status: AC
  Administered 2018-08-10: 21 mg via INTRAVENOUS
  Filled 2018-08-10: qty 21

## 2018-08-10 MED ORDER — HEPARIN SOD (PORK) LOCK FLUSH 100 UNIT/ML IV SOLN
500.0000 [IU] | Freq: Once | INTRAVENOUS | Status: AC | PRN
  Administered 2018-08-10: 500 [IU]
  Filled 2018-08-10: qty 5

## 2018-08-10 NOTE — Patient Instructions (Signed)
[facility] Discharge Instructions for Patients Receiving Chemotherapy  Today you received the following chemotherapy agents Gemzar, Cisplatin  To help prevent nausea and vomiting after your treatment, we encourage you to take your nausea medication as directed   If you develop nausea and vomiting that is not controlled by your nausea medication, call the clinic.   BELOW ARE SYMPTOMS THAT SHOULD BE REPORTED IMMEDIATELY:  *FEVER GREATER THAN 100.5 F  *CHILLS WITH OR WITHOUT FEVER  NAUSEA AND VOMITING THAT IS NOT CONTROLLED WITH YOUR NAUSEA MEDICATION  *UNUSUAL SHORTNESS OF BREATH  *UNUSUAL BRUISING OR BLEEDING  TENDERNESS IN MOUTH AND THROAT WITH OR WITHOUT PRESENCE OF ULCERS  *URINARY PROBLEMS  *BOWEL PROBLEMS  UNUSUAL RASH Items with * indicate a potential emergency and should be followed up as soon as possible.  Feel free to call the clinic should you have any questions or concerns. The clinic phone number is [phone].  Please show the CHEMO ALERT CARD at check-in to the Emergency Department and triage nurse.   

## 2018-08-10 NOTE — Progress Notes (Signed)
OK to treat with increased creatinine. Pt is on hemodialysis MWF per Dr. Burr Medico note 08/09/18

## 2018-08-10 NOTE — Telephone Encounter (Signed)
No los per 3/23. °

## 2018-08-20 NOTE — Progress Notes (Signed)
[place]   Telephone:(336) [phone] Fax:(336) [phone]   Clinic Follow up Note   Patient Care Team: Glendon Axe, MD as PCP - General (Family Medicine) [name], MD as PCP - Cardiology (Cardiology) Dwana Melena, MD as Attending Physician (Nephrology) Truitt Merle, MD as Consulting Physician (Hematology)  Date of Service:  08/23/2018  CHIEF COMPLAINT: F/u on intrahepatic cholangiocarcinoma  SUMMARY OF ONCOLOGIC HISTORY:   Intrahepatic cholangiocarcinoma ([place])   02/23/2018 Imaging    CT AP W Contrast 02/23/18  IMPRESSION: 1. Large multi-cystic lesion in the central aspect of the liver adjacent to the gallbladder fossa, with several smaller satellite lesions. These are all new compared to prior CT the chest, abdomen and pelvis 08/07/2016, concerning for intrahepatic abscesses. 2. Extensive mural thickening and inflammatory changes in the region of the cecum. This is nonspecific, and could reflect either right-sided diverticulitis, focal area of colitis, or potentially even underlying neoplasm. 3. Cholelithiasis. Gallbladder is nearly completely contracted without surrounding inflammatory changes to suggest an acute cholecystitis at this time. 4. Left-sided nephrolithiasis measuring up to 8 mm in the upper pole collecting system of left kidney. No ureteral stones or findings of urinary tract obstruction. 5. Aortic atherosclerosis.    02/27/2018 Initial Biopsy    Diagnosis 02/27/18  Liver, needle/core biopsy, Right Hepatic Lobe - ADENOCARCINOMA. SEE NOTE.    02/27/2018 Miscellaneous       03/05/2018 Procedure    Colonoscopy 03/05/18  IMPRESSION - Preparation of the colon was fair. - Non-thrombosed external hemorrhoids found on digital rectal exam. - There was significant looping of the colon. - Severe diverticulosis in the recto-sigmoid colon, in the sigmoid colon, in the descending colon, in the transverse colon, at the hepatic flexure, in the  ascending colon and in the cecum. There was no evidence of diverticular bleeding. - A single (solitary) ulcer in the cecum - highly concerning for underlying malignancy. Biopsied. Phlegmonous change is possible, though often in setting of complicated diverticulosis/diverticulitis ulceration would not be as common. - Erythematous mucosa in the transverse colon, at the hepatic flexure, in the ascending colon and in the cecum. Biopsied. - Normal mucosa in the rectum, in the recto-sigmoid colon, in the sigmoid colon and in the descending colon. Biopsied. - Non-bleeding non-thrombosed external and internal hemorrhoids. -----Negative for malignancy     03/11/2018 Initial Diagnosis    Intrahepatic cholangiocarcinoma ([place])    04/02/2018 Imaging    CT CAP W contrast 04/02/18  IMPRESSION: 1. 4 mm subpleural nodule in the periphery of the right lower lobe. This is nonspecific, but strongly favored to represent a benign subpleural lymph node. Attention on follow-up studies is recommended to exclude the possibility of metastatic disease. 2. Diffuse bronchial wall thickening with moderate centrilobular and mild paraseptal emphysema; imaging findings suggestive of underlying COPD. 3. Aortic atherosclerosis, in addition to left main and left anterior descending coronary artery disease. Assessment for potential risk factor modification, dietary therapy or pharmacologic therapy may be warranted, if clinically indicated. 4. Nonobstructive calculi in the upper pole collecting system of left kidney measuring up to 7 mm.     04/27/2018 Cancer Staging    Staging form: Intrahepatic Bile Duct, AJCC 8th Edition - Clinical: Stage IIIB (cT2, cN1, cM0) - Signed by Truitt Merle, MD on 04/27/2018    05/18/2018 -  Chemotherapy    Gemcitabine and Cisplatin every 2 weeksstarting on 05/18/2018, with 50% dose reduction    07/19/2018 Imaging    CT CAP W CONTRAST  IMPRESSION: 1.  Dominant inferior liver mass is  decreased in size. Two subcentimeter clustered low-attenuation lesions in the far inferior right liver lobe, not discretely visualized on the prior noncontrast CT study, can not exclude new small satellite hepatic metastases. 2. Porta hepatis adenopathy is mildly decreased. 3. Small right pulmonary nodules are stable. 4. No additional potential new or progressive metastatic disease. 5. Small pericardial effusion, slightly increased. 6. Cholelithiasis. 7. Moderate diffuse colonic diverticulosis. 8. Aortic Atherosclerosis (ICD10-I70.0) and Emphysema (ICD10-J43.9).       CURRENT THERAPY:  Gemcitabine and Cisplatin every 2 weeksstartingon 05/18/2018, with dose reduction  INTERVAL HISTORY:  Mary Chapman is here for a follow up of treatment. She presents to the clinic today by herself. She notes she feels well and stable with no new changes. She notes dialysis is going well. She denies any pain, dizziness or swelling. She notes she is eating adequately and able to gain weight. She notes she has the adequate energy to remain active. She denies phlegm or cough.    REVIEW OF SYSTEMS:   Constitutional: Denies fevers, chills or abnormal weight loss Eyes: Denies blurriness of vision Ears, nose, mouth, throat, and face: Denies mucositis or sore throat Respiratory: Denies cough, dyspnea or wheezes Cardiovascular: Denies palpitation, chest discomfort or lower extremity swelling Gastrointestinal:  Denies nausea, heartburn or change in bowel habits Skin: Denies abnormal skin rashes Lymphatics: Denies new lymphadenopathy or easy bruising Neurological:Denies numbness, tingling or new weaknesses Behavioral/Psych: Mood is stable, no new changes  All other systems were reviewed with the patient and are negative.  MEDICAL HISTORY:  Past Medical History:  Diagnosis Date  . Diverticulitis   . Focal segmental glomerulosclerosis    ESRD on MWF HD  . Hypertension   . intrahepatic cholangio ca  dx'd10/2019  . Renal insufficiency    dialysis pt MWF  . Tobacco abuse     SURGICAL HISTORY: Past Surgical History:  Procedure Laterality Date  . AV FISTULA PLACEMENT Left 03/18/2017   Procedure: Brachiocephalic ARTERIOVENOUS (AV) FISTULA CREATION left arm;  Surgeon: Elam Dutch, MD;  Location: [facility];  Service: Vascular;  Laterality: Left;  . BIOPSY  03/05/2018   Procedure: BIOPSY;  Surgeon: Rush Landmark Telford Nab., MD;  Location: [facility];  Service: Gastroenterology;;  enteroscopy bx /cytology brushing Greig Castilla bx  . COLONOSCOPY WITH PROPOFOL N/A 03/05/2018   Procedure: COLONOSCOPY WITH PROPOFOL;  Surgeon: Rush Landmark Telford Nab., MD;  Location: [facility];  Service: Gastroenterology;  Laterality: N/A;  Bx, Spot  . ENTEROSCOPY N/A 03/05/2018   Procedure: ENTEROSCOPY;  Surgeon: Rush Landmark Telford Nab., MD;  Location: [facility];  Service: Gastroenterology;  Laterality: N/A;  BX, Brushing, & Spot  . INSERTION OF DIALYSIS CATHETER N/A 03/18/2017   Procedure: INSERTION OF DIALYSIS CATHETER;  Surgeon: Elam Dutch, MD;  Location: [facility];  Service: Vascular;  Laterality: N/A;  . IR GENERIC HISTORICAL  08/15/2016   IR US GUIDE VASC ACCESS RIGHT 08/15/2016 Arne Cleveland, MD [facility]  . IR GENERIC HISTORICAL  08/15/2016   IR FLUORO GUIDE CV LINE RIGHT 08/15/2016 Arne Cleveland, MD [facility]  . IR IMAGING GUIDED PORT INSERTION  05/06/2018  . SUBMUCOSAL INJECTION  03/05/2018   Procedure: SUBMUCOSAL INJECTION;  Surgeon: Rush Landmark Telford Nab., MD;  Location: [facility];  Service: Gastroenterology;;  spot tatoo  . TUBAL LIGATION      I have reviewed the social history and family history with the patient and they are unchanged from previous note.  ALLERGIES:  has No Known Allergies.  MEDICATIONS:  Current Outpatient Medications  Medication Sig Dispense Refill  . folic acid (FOLVITE) 1 MG tablet Take 1 tablet (1 mg total) by mouth daily. 30 tablet 0  .  lidocaine-prilocaine (EMLA) cream Apply to affected area once 30 g 3  . metoprolol tartrate (LOPRESSOR) 25 MG tablet Take 0.5 tablets (12.5 mg total) by mouth 2 (two) times daily. 30 tablet 0  . multivitamin (RENA-VIT) TABS tablet Take 1 tablet by mouth at bedtime. 30 tablet 3  . nicotine (NICODERM CQ - DOSED IN MG/24 HOURS) 21 mg/24hr patch Place 1 patch (21 mg total) onto the skin daily. 28 patch 0  . Nutritional Supplements (FEEDING SUPPLEMENT, NEPRO CARB STEADY,) LIQD Take 237 mLs by mouth 3 (three) times daily as needed (Supplement). 90 Can 0  . ondansetron (ZOFRAN) 8 MG tablet Take 1 tablet (8 mg total) by mouth 2 (two) times daily as needed. Start on the third day after chemotherapy. 30 tablet 1  . oxyCODONE (OXY IR/ROXICODONE) 5 MG immediate release tablet Take 1 tablet (5 mg total) by mouth every 6 (six) hours as needed for severe pain. 30 tablet 0  . pantoprazole (PROTONIX) 40 MG tablet TAKE 1 TABLET BY MOUTH TWICE A DAY 60 tablet 1  . polyethylene glycol (MIRALAX) packet Take 17 g by mouth daily. 30 each 0  . prochlorperazine (COMPAZINE) 10 MG tablet Take 1 tablet (10 mg total) by mouth every 6 (six) hours as needed (Nausea or vomiting). 30 tablet 1  . senna-docusate (SENOKOT-S) 8.6-50 MG tablet Take 2 tablets by mouth 2 (two) times daily. 30 tablet 0  . Vitamin D, Ergocalciferol, (DRISDOL) 50000 units CAPS capsule Take 50,000 Units by mouth once a week.  5   No current facility-administered medications for this visit.    Facility-Administered Medications Ordered in Other Visits  Medication Dose Route Frequency Provider Last Rate Last Dose  . sodium chloride flush (NS) 0.9 % injection 10 mL  10 mL Intracatheter PRN Truitt Merle, MD   10 mL at 07/27/18 1745    PHYSICAL EXAMINATION: ECOG PERFORMANCE STATUS: 1 - Symptomatic but completely ambulatory  Vitals:   08/23/18 1512  BP: (!) 152/73  Pulse: 89  Resp: 17  Temp: 98.8 F (37.1 C)  SpO2: 100%   Filed Weights   08/23/18 1512   Weight: 139 lb (63 kg)   GENERAL:alert, no distress and comfortable SKIN: skin color, texture, turgor are normal, no rashes or significant lesions EYES: normal, Conjunctiva are pink and non-injected, sclera clear OROPHARYNX:no exudate, no erythema and lips, buccal mucosa, and tongue normal  NECK: supple, thyroid normal size, non-tender, without nodularity LYMPH:  no palpable lymphadenopathy in the cervical, axillary or inguinal LUNGS: clear to auscultation and percussion with normal breathing effort HEART: regular rate & rhythm and no murmurs and no lower extremity edema ABDOMEN:abdomen soft, non-tender and normal bowel sounds (+) Mild hepatomegaly, non-tender Musculoskeletal:no cyanosis of digits and no clubbing  NEURO: alert & oriented x 3 with fluent speech, no focal motor/sensory deficits  LABORATORY DATA:  I have reviewed the data as listed CBC Latest Ref Rng & Units 08/23/2018 08/09/2018 07/27/2018  WBC 4.0 - 10.5 K/uL 10.3 7.8 7.2  Hemoglobin 12.0 - 15.0 g/dL 9.2(L) 9.5(L) 7.9(L)  Hematocrit 36.0 - 46.0 % 28.6(L) 29.0(L) 24.7(L)  Platelets 150 - 400 K/uL 149(L) 190 213     CMP Latest Ref Rng & Units 08/23/2018 08/09/2018 07/27/2018  Glucose 70 - 99 mg/dL 109(H) 111(H) 92  BUN 8 - 23 mg/dL 26(H) 31(H)  30(H)  Creatinine 0.44 - 1.00 mg/dL 4.98(HH) 4.25(HH) 5.74(HH)  Sodium 135 - 145 mmol/L 139 140 138  Potassium 3.5 - 5.1 mmol/L 3.8 4.1 3.8  Chloride 98 - 111 mmol/L 97(L) 98 99  CO2 22 - 32 mmol/L 31 29 26   Calcium 8.9 - 10.3 mg/dL 8.3(L) 8.2(L) 8.0(L)  Total Protein 6.5 - 8.1 g/dL 7.2 7.3 6.7  Total Bilirubin 0.3 - 1.2 mg/dL 0.4 0.3 0.3  Alkaline Phos 38 - 126 U/L 126 139(H) 129(H)  AST 15 - 41 U/L 12(L) 12(L) 12(L)  ALT 0 - 44 U/L 6 7 6       RADIOGRAPHIC STUDIES: I have personally reviewed the radiological images as listed and agreed with the findings in the report. No results found.   ASSESSMENT & PLAN:  Mary Chapman is a 73 y.o. female with   1.  Adenocarcinomainliver, likelyintrahepatic cholangiocarcinoma, cT2N1M0 stage IIIB, unresectable, MS-Stable -Diagnosed in 02/2018. She is not eligible for surgery due to tumor size and location.She was seen by Dr. Cloyd Stagers at [facility] -Currently onfirst lineCisplatin and Gemcitabineevery 2 weekssince 12/2019with dose reduction. -She is tolerating chemotherapy moderately will, with mild fatigue, moderate anemia that required blood transfusion but noothersevere otherside affects -Her abdominal painhas resolved lately, indicating good response to chemotherapy. Her restaging CT on 07/19/2018 showed partial response  -She is clinically doing well and stable. Physical exam unremarkable except mild hepatomegaly. Labs reviewed, CBC WNL except anemia with hg 9.2 and PLT 149K, CMP showed elevated BUN and Cr. CA 19.9 still pending, has been stable lately. Overall adequate to proceed with chemotherapy tomorrow.  -Continue chemo every 2 weeks, next restaging scan in 3-4 months. May wait longer given COVID-19.  -continue monitor CA19.9 every 4 weeks  -F/u in 2 weeks with lacie. F/u with me in 4 weeks   2. HTN  -Currently on metoprolol. -BP at 152/73 (08/23/18), f/u with nephology and PCP  3. End Stage Renal Disease, on HD MWF -Treated with dialysis  -f/u with nephrologist Dr. [place]in Coupe -she is tolerating HD well   4. Anemia of chronic diseaseand chemo induced anemia  -Due to CKD and malignancy -She received blood transfusion after first cycle chemoand will consider blood transfusion when Hg <8.Due to the outbreak of COVID 19, blood supply has been low lately, will hold blood transfusion until hemoglobin less than 7 -We discussed the role of EPO for anemia, if she requires frequent blood transfusion.  She voiced good understanding, and agrees with EPO if she really needs it.  -I previously recommended her to take multivitamins  5.Goal of care discussion  -The patient understands the goal of care is  palliative, to prolong his life -she is full code now  Plan -lab reviewed, adequate for treatment, will proceed cycle 8 chemotherapy cisplatin and gemcitabine tomorrow -Lab, follow-up with [name] and chemo in 2 weeks  -Lab, port flush and f/u in 4 weeks with chemo cisplatin and gemcitabine on next day    No problem-specific Assessment & Plan notes found for this encounter.   No orders of the defined types were placed in this encounter.  All questions were answered. The patient knows to call the clinic with any problems, questions or concerns. No barriers to learning was detected. I spent 20 minutes counseling the patient face to face. The total time spent in the appointment was 25 minutes and more than 50% was on counseling and review of test results     Truitt Merle, MD 08/23/2018   I, Joslyn Devon, am  acting as scribe for Truitt Merle, MD.   I have reviewed the above documentation for accuracy and completeness, and I agree with the above.

## 2018-08-23 ENCOUNTER — Telehealth: Payer: Self-pay | Admitting: Hematology

## 2018-08-23 ENCOUNTER — Inpatient Hospital Stay: Payer: Medicare Other

## 2018-08-23 ENCOUNTER — Inpatient Hospital Stay: Payer: Medicare Other | Attending: Hematology | Admitting: Hematology

## 2018-08-23 ENCOUNTER — Other Ambulatory Visit: Payer: Self-pay

## 2018-08-23 ENCOUNTER — Encounter: Payer: Self-pay | Admitting: Hematology

## 2018-08-23 VITALS — BP 152/73 | HR 89 | Temp 98.8°F | Resp 17 | Ht 66.0 in | Wt 139.0 lb

## 2018-08-23 DIAGNOSIS — D6481 Anemia due to antineoplastic chemotherapy: Secondary | ICD-10-CM | POA: Diagnosis not present

## 2018-08-23 DIAGNOSIS — Z79899 Other long term (current) drug therapy: Secondary | ICD-10-CM | POA: Insufficient documentation

## 2018-08-23 DIAGNOSIS — D631 Anemia in chronic kidney disease: Secondary | ICD-10-CM | POA: Insufficient documentation

## 2018-08-23 DIAGNOSIS — N186 End stage renal disease: Secondary | ICD-10-CM | POA: Insufficient documentation

## 2018-08-23 DIAGNOSIS — R600 Localized edema: Secondary | ICD-10-CM | POA: Insufficient documentation

## 2018-08-23 DIAGNOSIS — I120 Hypertensive chronic kidney disease with stage 5 chronic kidney disease or end stage renal disease: Secondary | ICD-10-CM | POA: Insufficient documentation

## 2018-08-23 DIAGNOSIS — Z5111 Encounter for antineoplastic chemotherapy: Secondary | ICD-10-CM | POA: Diagnosis not present

## 2018-08-23 DIAGNOSIS — D490 Neoplasm of unspecified behavior of digestive system: Secondary | ICD-10-CM

## 2018-08-23 DIAGNOSIS — C221 Intrahepatic bile duct carcinoma: Secondary | ICD-10-CM

## 2018-08-23 DIAGNOSIS — Z992 Dependence on renal dialysis: Secondary | ICD-10-CM | POA: Insufficient documentation

## 2018-08-23 DIAGNOSIS — I10 Essential (primary) hypertension: Secondary | ICD-10-CM

## 2018-08-23 DIAGNOSIS — K6389 Other specified diseases of intestine: Secondary | ICD-10-CM

## 2018-08-23 LAB — CBC WITH DIFFERENTIAL (CANCER CENTER ONLY)
Abs Immature Granulocytes: 0.09 10*3/uL — ABNORMAL HIGH (ref 0.00–0.07)
Basophils Absolute: 0 10*3/uL (ref 0.0–0.1)
Basophils Relative: 0 %
Eosinophils Absolute: 1.8 10*3/uL — ABNORMAL HIGH (ref 0.0–0.5)
Eosinophils Relative: 17 %
HCT: 28.6 % — ABNORMAL LOW (ref 36.0–46.0)
Hemoglobin: 9.2 g/dL — ABNORMAL LOW (ref 12.0–15.0)
Immature Granulocytes: 1 %
Lymphocytes Relative: 14 %
Lymphs Abs: 1.5 10*3/uL (ref 0.7–4.0)
MCH: 33.1 pg (ref 26.0–34.0)
MCHC: 32.2 g/dL (ref 30.0–36.0)
MCV: 102.9 fL — ABNORMAL HIGH (ref 80.0–100.0)
Monocytes Absolute: 0.6 10*3/uL (ref 0.1–1.0)
Monocytes Relative: 6 %
Neutro Abs: 6.4 10*3/uL (ref 1.7–7.7)
Neutrophils Relative %: 62 %
Platelet Count: 149 10*3/uL — ABNORMAL LOW (ref 150–400)
RBC: 2.78 MIL/uL — ABNORMAL LOW (ref 3.87–5.11)
RDW: 15.9 % — ABNORMAL HIGH (ref 11.5–15.5)
WBC Count: 10.3 10*3/uL (ref 4.0–10.5)
nRBC: 0 % (ref 0.0–0.2)

## 2018-08-23 LAB — CMP (CANCER CENTER ONLY)
ALT: <6 U/L (ref 0–44)
AST: 12 U/L — ABNORMAL LOW (ref 15–41)
Albumin: 3 g/dL — ABNORMAL LOW (ref 3.5–5.0)
Alkaline Phosphatase: 126 U/L (ref 38–126)
Anion gap: 11 (ref 5–15)
BUN: 26 mg/dL — ABNORMAL HIGH (ref 8–23)
CO2: 31 mmol/L (ref 22–32)
Calcium: 8.3 mg/dL — ABNORMAL LOW (ref 8.9–10.3)
Chloride: 97 mmol/L — ABNORMAL LOW (ref 98–111)
Creatinine: 4.98 mg/dL (ref 0.44–1.00)
GFR, Est AFR Am: 9 mL/min — ABNORMAL LOW (ref >60)
GFR, Estimated: 8 mL/min — ABNORMAL LOW (ref >60)
Glucose, Bld: 109 mg/dL — ABNORMAL HIGH (ref 70–99)
Potassium: 3.8 mmol/L (ref 3.5–5.1)
Sodium: 139 mmol/L (ref 135–145)
Total Bilirubin: 0.4 mg/dL (ref 0.3–1.2)
Total Protein: 7.2 g/dL (ref 6.5–8.1)

## 2018-08-23 NOTE — Telephone Encounter (Signed)
Scheduled appt per 4/6 los 

## 2018-08-24 ENCOUNTER — Other Ambulatory Visit: Payer: Self-pay

## 2018-08-24 ENCOUNTER — Inpatient Hospital Stay: Payer: Medicare Other

## 2018-08-24 VITALS — BP 179/90 | HR 78 | Temp 98.4°F | Resp 18

## 2018-08-24 DIAGNOSIS — C221 Intrahepatic bile duct carcinoma: Secondary | ICD-10-CM

## 2018-08-24 DIAGNOSIS — Z5111 Encounter for antineoplastic chemotherapy: Secondary | ICD-10-CM | POA: Diagnosis not present

## 2018-08-24 LAB — CANCER ANTIGEN 19-9: CA 19-9: 28 U/mL (ref 0–35)

## 2018-08-24 MED ORDER — SODIUM CHLORIDE 0.9% FLUSH
10.0000 mL | INTRAVENOUS | Status: DC | PRN
  Administered 2018-08-24: 10 mL
  Filled 2018-08-24: qty 10

## 2018-08-24 MED ORDER — SODIUM CHLORIDE 0.9 % IV SOLN
12.5000 mg/m2 | Freq: Once | INTRAVENOUS | Status: AC
  Administered 2018-08-24: 21 mg via INTRAVENOUS
  Filled 2018-08-24: qty 21

## 2018-08-24 MED ORDER — PALONOSETRON HCL INJECTION 0.25 MG/5ML
0.2500 mg | Freq: Once | INTRAVENOUS | Status: AC
  Administered 2018-08-24: 0.25 mg via INTRAVENOUS

## 2018-08-24 MED ORDER — SODIUM CHLORIDE 0.9 % IV SOLN
Freq: Once | INTRAVENOUS | Status: AC
  Administered 2018-08-24: 10:00:00 via INTRAVENOUS
  Filled 2018-08-24: qty 250

## 2018-08-24 MED ORDER — SODIUM CHLORIDE 0.9 % IV SOLN
Freq: Once | INTRAVENOUS | Status: AC
  Administered 2018-08-24: 10:00:00 via INTRAVENOUS
  Filled 2018-08-24: qty 5

## 2018-08-24 MED ORDER — PALONOSETRON HCL INJECTION 0.25 MG/5ML
INTRAVENOUS | Status: AC
  Filled 2018-08-24: qty 5

## 2018-08-24 MED ORDER — HEPARIN SOD (PORK) LOCK FLUSH 100 UNIT/ML IV SOLN
500.0000 [IU] | Freq: Once | INTRAVENOUS | Status: AC | PRN
  Administered 2018-08-24: 500 [IU]
  Filled 2018-08-24: qty 5

## 2018-08-24 MED ORDER — SODIUM CHLORIDE 0.9 % IV SOLN
500.0000 mg/m2 | Freq: Once | INTRAVENOUS | Status: AC
  Administered 2018-08-24: 836 mg via INTRAVENOUS
  Filled 2018-08-24: qty 21.99

## 2018-08-24 NOTE — Patient Instructions (Signed)
Coronavirus (COVID-19) Are you at risk?  Are you at risk for the Coronavirus (COVID-19)?  To be considered HIGH RISK for Coronavirus (COVID-19), you have to meet the following criteria:  . Traveled to [place], [place], [place], [place] or [place]; or in the [place] to [place], [place], [place], or [place]; and have fever, cough, and shortness of breath within the last 2 weeks of travel OR . Been in close contact with a person diagnosed with COVID-19 within the last 2 weeks and have fever, cough, and shortness of breath . IF YOU DO NOT MEET THESE CRITERIA, YOU ARE CONSIDERED LOW RISK FOR COVID-19.  What to do if you are HIGH RISK for COVID-19?  . If you are having a medical emergency, call 911. . Seek medical care right away. Before you go to a doctor's office, urgent care or emergency department, call ahead and tell them about your recent travel, contact with someone diagnosed with COVID-19, and your symptoms. You should receive instructions from your physician's office regarding next steps of care.  . When you arrive at healthcare provider, tell the healthcare staff immediately you have returned from visiting [place], [place], [place], [place] or [place]; or traveled in the [place] to [place], [place], [place], or [place]; in the last two weeks or you have been in close contact with a person diagnosed with COVID-19 in the last 2 weeks.   . Tell the health care staff about your symptoms: fever, cough and shortness of breath. . After you have been seen by a medical provider, you will be either: o Tested for (COVID-19) and discharged home on quarantine except to seek medical care if symptoms worsen, and asked to  - Stay home and avoid contact with others until you get your results (4-5 days)  - Avoid travel on public transportation if possible (such as bus, train, or airplane) or o Sent to the Emergency Department by EMS for evaluation, COVID-19 testing, and possible  admission depending on your condition and test results.  What to do if you are LOW RISK for COVID-19?  Reduce your risk of any infection by using the same precautions used for avoiding the common cold or flu:  . Wash your hands often with soap and warm water for at least 20 seconds.  If soap and water are not readily available, use an alcohol-based hand sanitizer with at least 60% alcohol.  . If coughing or sneezing, cover your mouth and nose by coughing or sneezing into the elbow areas of your shirt or coat, into a tissue or into your sleeve (not your hands). . Avoid shaking hands with others and consider head nods or verbal greetings only. . Avoid touching your eyes, nose, or mouth with unwashed hands.  . Avoid close contact with people who are sick. . Avoid places or events with large numbers of people in one location, like concerts or sporting events. . Carefully consider travel plans you have or are making. . If you are planning any travel outside or inside the [place], visit the CDC's Travelers' Health webpage for the latest health notices. . If you have some symptoms but not all symptoms, continue to monitor at home and seek medical attention if your symptoms worsen. . If you are having a medical emergency, call 911.   ADDITIONAL HEALTHCARE OPTIONS FOR PATIENTS  [facility] / e-Visit: [email or web address]         [facility]: [phone]  Onset   Urgent Care: [facility]: [facility] Discharge Instructions for Patients Receiving Chemotherapy  Today you received the following chemotherapy agents: Gemcitabine (Gemzar) and Cisplatin (Platinol)  To help prevent nausea and vomiting after your treatment, we encourage you to take your nausea medication as directed.    If you develop nausea and vomiting that is not controlled by your nausea  medication, call the clinic.   BELOW ARE SYMPTOMS THAT SHOULD BE REPORTED IMMEDIATELY:  *FEVER GREATER THAN 100.5 F  *CHILLS WITH OR WITHOUT FEVER  NAUSEA AND VOMITING THAT IS NOT CONTROLLED WITH YOUR NAUSEA MEDICATION  *UNUSUAL SHORTNESS OF BREATH  *UNUSUAL BRUISING OR BLEEDING  TENDERNESS IN MOUTH AND THROAT WITH OR WITHOUT PRESENCE OF ULCERS  *URINARY PROBLEMS  *BOWEL PROBLEMS  UNUSUAL RASH Items with * indicate a potential emergency and should be followed up as soon as possible.  Feel free to call the clinic should you have any questions or concerns. The clinic phone number is [phone].  Please show the [facility] at check-in to the Emergency Department and triage nurse.

## 2018-09-02 ENCOUNTER — Other Ambulatory Visit: Payer: Self-pay | Admitting: Hematology

## 2018-09-06 NOTE — Progress Notes (Signed)
Mary Chapman   Telephone:([phone] Fax:(336) [phone]   Clinic Follow up Note   Patient Care Team: Glendon Axe, MD as PCP - General (Family Medicine) [name], MD as PCP - Cardiology (Cardiology) Dwana Melena, MD as Attending Physician (Nephrology) Truitt Merle, MD as Consulting Physician (Hematology) Date of Service: 09/07/2018  CHIEF COMPLAINT: F/u intrahepatic cholangiocarcinoma    SUMMARY OF ONCOLOGIC HISTORY:   Intrahepatic cholangiocarcinoma ([place])   02/23/2018 Imaging    CT AP W Contrast 02/23/18  IMPRESSION: 1. Large multi-cystic lesion in the central aspect of the liver adjacent to the gallbladder fossa, with several smaller satellite lesions. These are all new compared to prior CT the chest, abdomen and pelvis 08/07/2016, concerning for intrahepatic abscesses. 2. Extensive mural thickening and inflammatory changes in the region of the cecum. This is nonspecific, and could reflect either right-sided diverticulitis, focal area of colitis, or potentially even underlying neoplasm. 3. Cholelithiasis. Gallbladder is nearly completely contracted without surrounding inflammatory changes to suggest an acute cholecystitis at this time. 4. Left-sided nephrolithiasis measuring up to 8 mm in the upper pole collecting system of left kidney. No ureteral stones or findings of urinary tract obstruction. 5. Aortic atherosclerosis.    02/27/2018 Initial Biopsy    Diagnosis 02/27/18  Liver, needle/core biopsy, Right Hepatic Lobe - ADENOCARCINOMA. SEE NOTE.    02/27/2018 Miscellaneous       03/05/2018 Procedure    Colonoscopy 03/05/18  IMPRESSION - Preparation of the colon was fair. - Non-thrombosed external hemorrhoids found on digital rectal exam. - There was significant looping of the colon. - Severe diverticulosis in the recto-sigmoid colon, in the sigmoid colon, in the descending colon, in the transverse colon, at the hepatic flexure, in the ascending  colon and in the cecum. There was no evidence of diverticular bleeding. - A single (solitary) ulcer in the cecum - highly concerning for underlying malignancy. Biopsied. Phlegmonous change is possible, though often in setting of complicated diverticulosis/diverticulitis ulceration would not be as common. - Erythematous mucosa in the transverse colon, at the hepatic flexure, in the ascending colon and in the cecum. Biopsied. - Normal mucosa in the rectum, in the recto-sigmoid colon, in the sigmoid colon and in the descending colon. Biopsied. - Non-bleeding non-thrombosed external and internal hemorrhoids. -----Negative for malignancy     03/11/2018 Initial Diagnosis    Intrahepatic cholangiocarcinoma ([place])    04/02/2018 Imaging    CT CAP W contrast 04/02/18  IMPRESSION: 1. 4 mm subpleural nodule in the periphery of the right lower lobe. This is nonspecific, but strongly favored to represent a benign subpleural lymph node. Attention on follow-up studies is recommended to exclude the possibility of metastatic disease. 2. Diffuse bronchial wall thickening with moderate centrilobular and mild paraseptal emphysema; imaging findings suggestive of underlying COPD. 3. Aortic atherosclerosis, in addition to left main and left anterior descending coronary artery disease. Assessment for potential risk factor modification, dietary therapy or pharmacologic therapy may be warranted, if clinically indicated. 4. Nonobstructive calculi in the upper pole collecting system of left kidney measuring up to 7 mm.     04/27/2018 Cancer Staging    Staging form: Intrahepatic Bile Duct, AJCC 8th Edition - Clinical: Stage IIIB (cT2, cN1, cM0) - Signed by Truitt Merle, MD on 04/27/2018    05/18/2018 -  Chemotherapy    Gemcitabine and Cisplatin every 2 weeksstarting on 05/18/2018, with 50% dose reduction    07/19/2018 Imaging    CT CAP W CONTRAST  IMPRESSION: 1. Dominant  inferior liver mass is decreased in  size. Two subcentimeter clustered low-attenuation lesions in the far inferior right liver lobe, not discretely visualized on the prior noncontrast CT study, can not exclude new small satellite hepatic metastases. 2. Porta hepatis adenopathy is mildly decreased. 3. Small right pulmonary nodules are stable. 4. No additional potential new or progressive metastatic disease. 5. Small pericardial effusion, slightly increased. 6. Cholelithiasis. 7. Moderate diffuse colonic diverticulosis. 8. Aortic Atherosclerosis (ICD10-I70.0) and Emphysema (ICD10-J43.9).      CURRENT THERAPY:  Gemcitabine and Cisplatin every 2 weeksstartingon 05/18/2018, with dose reduction  INTERVAL HISTORY: Mary Chapman returns for follow up as scheduled for cycle 9 gemcitabine and cisplatin. She completed cycle 8 on 08/24/2018. She feels well. Appetite continues to improve, weight is up. She has not eaten yet today. No mouth sores. Fatigue fluctuates, more lately and sleeping more. She considers it moderate. Her feet and face feel "puffy" lately. No other leg edema of calf pain. Denies nausea, vomiting, constipation, diarrhea. Had a normal formed non-bloody BM yesterday. Denies abdominal pain. Rarely uses oxycodone. Has little urine output at baseline, denies blood in urine or pain. Continues dialysis MWF. Denies fever, cough, chest pain, dyspnea, lightheadedness, dizziness, numbness, or tingling in hands/feet.   REVIEW OF SYSTEMS:   Constitutional: Denies fevers, chills (+) weight gain (+) moderate fatigue, increased  Ears, nose, mouth, throat, and face: Denies mucositis  Respiratory: Denies cough, dyspnea or wheezes Cardiovascular: Denies palpitation, chest discomfort, calf swelling or pain (+) feet are "puffy" Gastrointestinal:  Denies nausea, vomiting, constipation, diarrhea, or abdominal pain  GU: Denies hematuria or dysuria (+) low urine output at baseline  Skin: Denies abnormal skin rashes Neurological:Denies numbness,  tingling, lightheadedness or dizziness  Behavioral/Psych: Mood is stable, no new changes  All other systems were reviewed with the patient and are negative.  MEDICAL HISTORY:  Past Medical History:  Diagnosis Date   Diverticulitis    Focal segmental glomerulosclerosis    ESRD on MWF HD   Hypertension    intrahepatic cholangio ca dx'd10/2019   Renal insufficiency    dialysis pt MWF   Tobacco abuse     SURGICAL HISTORY: Past Surgical History:  Procedure Laterality Date   AV FISTULA PLACEMENT Left 03/18/2017   Procedure: Brachiocephalic ARTERIOVENOUS (AV) FISTULA CREATION left arm;  Surgeon: Elam Dutch, MD;  Location: [facility];  Service: Vascular;  Laterality: Left;   BIOPSY  03/05/2018   Procedure: BIOPSY;  Surgeon: Irving Copas., MD;  Location: [facility] ENDOSCOPY;  Service: Gastroenterology;;  enteroscopy bx /cytology brushing Greig Castilla bx   COLONOSCOPY WITH PROPOFOL N/A 03/05/2018   Procedure: COLONOSCOPY WITH PROPOFOL;  Surgeon: Irving Copas., MD;  Location: [place];  Service: Gastroenterology;  Laterality: N/A;  Bx, Spot   ENTEROSCOPY N/A 03/05/2018   Procedure: ENTEROSCOPY;  Surgeon: Mansouraty, Telford Nab., MD;  Location: [facility] ENDOSCOPY;  Service: Gastroenterology;  Laterality: N/A;  BX, Brushing, & Spot   INSERTION OF DIALYSIS CATHETER N/A 03/18/2017   Procedure: INSERTION OF DIALYSIS CATHETER;  Surgeon: Elam Dutch, MD;  Location: [place];  Service: Vascular;  Laterality: N/A;   IR GENERIC HISTORICAL  08/15/2016   IR US GUIDE VASC ACCESS RIGHT 08/15/2016 Arne Cleveland, MD [facility]INTER[facility] RAD   IR GENERIC HISTORICAL  08/15/2016   IR FLUORO GUIDE CV LINE RIGHT 08/15/2016 Arne Cleveland, MD [facility]   IR IMAGING GUIDED PORT INSERTION  05/06/2018   SUBMUCOSAL INJECTION  03/05/2018   Procedure: SUBMUCOSAL INJECTION;  Surgeon: Irving Copas., MD;  Location: [facility] ENDOSCOPY;  Service: Gastroenterology;;  spot tatoo   TUBAL LIGATION      I  have reviewed the social history and family history with the patient and they are unchanged from previous note.  ALLERGIES:  has No Known Allergies.  MEDICATIONS:  Current Outpatient Medications  Medication Sig Dispense Refill   folic acid (FOLVITE) 1 MG tablet Take 1 tablet (1 mg total) by mouth daily. 30 tablet 0   lidocaine-prilocaine (EMLA) cream Apply to affected area once 30 g 3   metoprolol tartrate (LOPRESSOR) 25 MG tablet Take 0.5 tablets (12.5 mg total) by mouth 2 (two) times daily. 30 tablet 0   multivitamin (RENA-VIT) TABS tablet Take 1 tablet by mouth at bedtime. 30 tablet 3   ondansetron (ZOFRAN) 8 MG tablet Take 1 tablet (8 mg total) by mouth 2 (two) times daily as needed. Start on the third day after chemotherapy. 30 tablet 1   oxyCODONE (OXY IR/ROXICODONE) 5 MG immediate release tablet Take 1 tablet (5 mg total) by mouth every 6 (six) hours as needed for severe pain. 30 tablet 0   pantoprazole (PROTONIX) 40 MG tablet TAKE 1 TABLET BY MOUTH TWICE A DAY 60 tablet 1   polyethylene glycol (MIRALAX) packet Take 17 g by mouth daily. 30 each 0   prochlorperazine (COMPAZINE) 10 MG tablet Take 1 tablet (10 mg total) by mouth every 6 (six) hours as needed (Nausea or vomiting). 30 tablet 1   Vitamin D, Ergocalciferol, (DRISDOL) 50000 units CAPS capsule Take 50,000 Units by mouth once a week.  5   nicotine (NICODERM CQ - DOSED IN MG/24 HOURS) 21 mg/24hr patch Place 1 patch (21 mg total) onto the skin daily. 28 patch 0   Nutritional Supplements (FEEDING SUPPLEMENT, NEPRO CARB STEADY,) LIQD Take 237 mLs by mouth 3 (three) times daily as needed (Supplement). 90 Can 0   senna-docusate (SENOKOT-S) 8.6-50 MG tablet Take 2 tablets by mouth 2 (two) times daily. 30 tablet 0   No current facility-administered medications for this visit.    Facility-Administered Medications Ordered in Other Visits  Medication Dose Route Frequency Provider Last Rate Last Dose   0.9 %  sodium chloride  infusion (Manually program via Guardrails IV Fluids)  250 mL Intravenous Once Alla Feeling, NP       heparin lock flush 100 unit/mL  250 Units Intracatheter PRN Alla Feeling, NP       heparin lock flush 100 unit/mL  500 Units Intracatheter Daily PRN Alla Feeling, NP       sodium chloride flush (NS) 0.9 % injection 10 mL  10 mL Intracatheter PRN Truitt Merle, MD   10 mL at 07/27/18 1745   sodium chloride flush (NS) 0.9 % injection 10 mL  10 mL Intracatheter PRN Alla Feeling, NP        PHYSICAL EXAMINATION: ECOG PERFORMANCE STATUS: 1 - Symptomatic but completely ambulatory  Vitals:   09/07/18 0825  BP: (!) 170/83  Pulse: 75  Resp: 18  Temp: 98.3 F (36.8 C)  SpO2: 97%   Filed Weights   09/07/18 0825  Weight: 147 lb 11.2 oz (67 kg)    GENERAL:alert, no distress and comfortable SKIN: skin color normal; no obvious rash EYES: sclera anicteric  LUNGS: coarse. normal breathing effort HEART: regular rate & rhythm; trace pedal edema  ABDOMEN:abdomen soft, non-tender with normal bowel sounds. Hepatomegaly noted  Musculoskeletal:no cyanosis of digits and no clubbing  NEURO: alert & oriented x 3 with fluent  speech PAC without erythema   LABORATORY DATA:  I have reviewed the data as listed CBC Latest Ref Rng & Units 09/07/2018 08/23/2018 08/09/2018  WBC 4.0 - 10.5 K/uL 12.7(H) 10.3 7.8  Hemoglobin 12.0 - 15.0 g/dL 7.6(L) 9.2(L) 9.5(L)  Hematocrit 36.0 - 46.0 % 24.3(L) 28.6(L) 29.0(L)  Platelets 150 - 400 K/uL 207 149(L) 190     CMP Latest Ref Rng & Units 09/07/2018 08/23/2018 08/09/2018  Glucose 70 - 99 mg/dL 56(L) 109(H) 111(H)  BUN 8 - 23 mg/dL 39(H) 26(H) 31(H)  Creatinine 0.44 - 1.00 mg/dL 5.38(HH) 4.98(HH) 4.25(HH)  Sodium 135 - 145 mmol/L 140 139 140  Potassium 3.5 - 5.1 mmol/L 4.0 3.8 4.1  Chloride 98 - 111 mmol/L 102 97(L) 98  CO2 22 - 32 mmol/L _0 Calcium 8.9 - 10.3 mg/dL 7.5(L) 8.3(L) 8.2(L)  Total Protein 6.5 - 8.1 g/dL 6.4(L) 7.2 7.3  Total Bilirubin  0.3 - 1.2 mg/dL 0.3 0.4 0.3  Alkaline Phos 38 - 126 U/L 125 126 139(H)  AST 15 - 41 U/L 12(L) 12(L) 12(L)  ALT 0 - 44 U/L 7 <6 7      RADIOGRAPHIC STUDIES: I have personally reviewed the radiological images as listed and agreed with the findings in the report. No results found.   ASSESSMENT & PLAN: Mary Chapman is a 73 y.o. female with   1. Adenocarcinomainliver, likelyintrahepatic cholangiocarcinoma, cT2N1M0 stage IIIB, unresectable, MSI-Stable -Initially diagnosed in 02/2018; she was seen by Dr. Cloyd Stagers at [facility] and deemed not a surgical candidate. She began first line systemic chemotherapy with dose-reduced gemcitabine and cisplatin on 05/18/2018. She required RBC transfusion after cycle 1 but otherwise tolerated well.  -clinically her abdominal pain improved, she rarely uses PRN oxycodone -Restaging CT on 07/19/2018 was previously reviewed, showed partial response. She continues chemotherapy Q2 weeks.  -Plan to restage in 3-4 months  -Today she appears stable. She continues to tolerate chemotherapy well overall. VSS. -No covid19 symptoms. -For pedal edema I encouraged her to elevate legs. Her fluid status is adjusted with dialysis. -Labs reviewed: Hypoglycemia blood glucose 56, asymptomatic. Patient had not eaten today. She was given orange juice and peanut butter crackers in exam room. Will check level in treatment room. CMP otherwise stable. Cr. 5.38 on HD on MWF. Anemia Hgb 7.6 she is symptomatic with increased fatigue. Plan to give 1 unit RBCs this week. OK to proceed with cisplatin and gemcitabine today at current doses.   -she will return for RBC transfusion this week, f/u in 2 weeks with next cycle   2. HTN  -Currently on metoprolol.  3. End Stage Renal Disease, on HD MWF -followed by nephrologist Dr. Augustin Coupe  4. Anemia of chronic diseaseand chemo induced anemia -Due to CKD and malignancy -s/p RBC transfusion after cycle 1 -Hgb 7.6 today, symptomatic with increased  fatigue. Will receive 1 unit RBCs this week. OK per blood bank  5.Goal of care discussion - treatment is palliative; full code   Plan -Labs reviewed, proceed with cycle 9 cisplatin and gemcitabine at current doses -Hgb 7.6, 1 unit RBC transfusion 09/09/2018 -food given for hypoglycemia, repeat finger stick in treatment area -F/u with me in 2 weeks with next cycle    Orders Placed This Encounter  Procedures   CBC with Differential ([place] Only)    Standing Status:   Standing    Number of Occurrences:   20    Standing Expiration Date:   09/08/2019   Practitioner attestation of consent  I, the ordering practitioner, attest that I have discussed with the patient the benefits, risks, side effects, alternatives, likelihood of achieving goals and potential problems during recovery for the procedure listed.    Standing Status:   Future    Standing Expiration Date:   09/07/2019    Order Specific Question:   Procedure    Answer:   Blood Product(s)   Complete patient signature process for consent form    Standing Status:   Future    Standing Expiration Date:   09/07/2019   Care order/instruction    Transfuse Parameters    Standing Status:   Future    Standing Expiration Date:   09/07/2019   Type and screen    Standing Status:   Future    Number of Occurrences:   1    Standing Expiration Date:   09/07/2019   Prepare RBC    Standing Status:   Standing    Number of Occurrences:   1    Order Specific Question:   # of Units    Answer:   1 unit    Order Specific Question:   Transfusion Indications    Answer:   Symptomatic Anemia    Order Specific Question:   If emergent release call blood bank    Answer:   Not emergent release   All questions were answered. The patient knows to call the clinic with any problems, questions or concerns. No barriers to learning was detected.     Alla Feeling, NP 09/09/18

## 2018-09-07 ENCOUNTER — Telehealth: Payer: Self-pay

## 2018-09-07 ENCOUNTER — Telehealth: Payer: Self-pay | Admitting: Nurse Practitioner

## 2018-09-07 ENCOUNTER — Inpatient Hospital Stay: Payer: Medicare Other

## 2018-09-07 ENCOUNTER — Other Ambulatory Visit: Payer: Self-pay

## 2018-09-07 ENCOUNTER — Encounter: Payer: Self-pay | Admitting: Nurse Practitioner

## 2018-09-07 ENCOUNTER — Inpatient Hospital Stay (HOSPITAL_BASED_OUTPATIENT_CLINIC_OR_DEPARTMENT_OTHER): Payer: Medicare Other | Admitting: Nurse Practitioner

## 2018-09-07 VITALS — BP 170/83 | HR 75 | Temp 98.3°F | Resp 18 | Ht 66.0 in | Wt 147.7 lb

## 2018-09-07 DIAGNOSIS — C221 Intrahepatic bile duct carcinoma: Secondary | ICD-10-CM

## 2018-09-07 DIAGNOSIS — D631 Anemia in chronic kidney disease: Secondary | ICD-10-CM | POA: Diagnosis not present

## 2018-09-07 DIAGNOSIS — Z79899 Other long term (current) drug therapy: Secondary | ICD-10-CM

## 2018-09-07 DIAGNOSIS — I120 Hypertensive chronic kidney disease with stage 5 chronic kidney disease or end stage renal disease: Secondary | ICD-10-CM | POA: Diagnosis not present

## 2018-09-07 DIAGNOSIS — Z5111 Encounter for antineoplastic chemotherapy: Secondary | ICD-10-CM | POA: Diagnosis not present

## 2018-09-07 DIAGNOSIS — D490 Neoplasm of unspecified behavior of digestive system: Secondary | ICD-10-CM

## 2018-09-07 DIAGNOSIS — R600 Localized edema: Secondary | ICD-10-CM

## 2018-09-07 DIAGNOSIS — D6481 Anemia due to antineoplastic chemotherapy: Secondary | ICD-10-CM

## 2018-09-07 DIAGNOSIS — D638 Anemia in other chronic diseases classified elsewhere: Secondary | ICD-10-CM

## 2018-09-07 DIAGNOSIS — E162 Hypoglycemia, unspecified: Secondary | ICD-10-CM

## 2018-09-07 DIAGNOSIS — N186 End stage renal disease: Secondary | ICD-10-CM

## 2018-09-07 DIAGNOSIS — K6389 Other specified diseases of intestine: Secondary | ICD-10-CM

## 2018-09-07 LAB — CMP (CANCER CENTER ONLY)
ALT: 7 U/L (ref 0–44)
AST: 12 U/L — ABNORMAL LOW (ref 15–41)
Albumin: 2.7 g/dL — ABNORMAL LOW (ref 3.5–5.0)
Alkaline Phosphatase: 125 U/L (ref 38–126)
Anion gap: 12 (ref 5–15)
BUN: 39 mg/dL — ABNORMAL HIGH (ref 8–23)
CO2: 26 mmol/L (ref 22–32)
Calcium: 7.5 mg/dL — ABNORMAL LOW (ref 8.9–10.3)
Chloride: 102 mmol/L (ref 98–111)
Creatinine: 5.38 mg/dL (ref 0.44–1.00)
GFR, Est AFR Am: 9 mL/min — ABNORMAL LOW (ref >60)
GFR, Estimated: 7 mL/min — ABNORMAL LOW (ref >60)
Glucose, Bld: 56 mg/dL — ABNORMAL LOW (ref 70–99)
Potassium: 4 mmol/L (ref 3.5–5.1)
Sodium: 140 mmol/L (ref 135–145)
Total Bilirubin: 0.3 mg/dL (ref 0.3–1.2)
Total Protein: 6.4 g/dL — ABNORMAL LOW (ref 6.5–8.1)

## 2018-09-07 LAB — CBC WITH DIFFERENTIAL (CANCER CENTER ONLY)
Abs Immature Granulocytes: 0.05 10*3/uL (ref 0.00–0.07)
Basophils Absolute: 0 10*3/uL (ref 0.0–0.1)
Basophils Relative: 0 %
Eosinophils Absolute: 5.2 10*3/uL — ABNORMAL HIGH (ref 0.0–0.5)
Eosinophils Relative: 41 %
HCT: 24.3 % — ABNORMAL LOW (ref 36.0–46.0)
Hemoglobin: 7.6 g/dL — ABNORMAL LOW (ref 12.0–15.0)
Immature Granulocytes: 0 %
Lymphocytes Relative: 13 %
Lymphs Abs: 1.7 10*3/uL (ref 0.7–4.0)
MCH: 34.1 pg — ABNORMAL HIGH (ref 26.0–34.0)
MCHC: 31.3 g/dL (ref 30.0–36.0)
MCV: 109 fL — ABNORMAL HIGH (ref 80.0–100.0)
Monocytes Absolute: 1 10*3/uL (ref 0.1–1.0)
Monocytes Relative: 8 %
Neutro Abs: 4.8 10*3/uL (ref 1.7–7.7)
Neutrophils Relative %: 38 %
Platelet Count: 207 10*3/uL (ref 150–400)
RBC: 2.23 MIL/uL — ABNORMAL LOW (ref 3.87–5.11)
RDW: 15.5 % (ref 11.5–15.5)
WBC Count: 12.7 10*3/uL — ABNORMAL HIGH (ref 4.0–10.5)
nRBC: 0 % (ref 0.0–0.2)

## 2018-09-07 LAB — MAGNESIUM: Magnesium: 1.9 mg/dL (ref 1.7–2.4)

## 2018-09-07 LAB — GLUCOSE, CAPILLARY: Glucose-Capillary: 87 mg/dL (ref 70–99)

## 2018-09-07 MED ORDER — SODIUM CHLORIDE 0.9 % IV SOLN
500.0000 mg/m2 | Freq: Once | INTRAVENOUS | Status: AC
  Administered 2018-09-07: 13:00:00 836 mg via INTRAVENOUS
  Filled 2018-09-07: qty 21.99

## 2018-09-07 MED ORDER — SODIUM CHLORIDE 0.9 % IV SOLN
Freq: Once | INTRAVENOUS | Status: AC
  Administered 2018-09-07: 11:00:00 via INTRAVENOUS
  Filled 2018-09-07: qty 5

## 2018-09-07 MED ORDER — SODIUM CHLORIDE 0.9 % IV SOLN
Freq: Once | INTRAVENOUS | Status: AC
  Administered 2018-09-07: 10:00:00 via INTRAVENOUS
  Filled 2018-09-07: qty 250

## 2018-09-07 MED ORDER — PALONOSETRON HCL INJECTION 0.25 MG/5ML
INTRAVENOUS | Status: AC
  Filled 2018-09-07: qty 5

## 2018-09-07 MED ORDER — PALONOSETRON HCL INJECTION 0.25 MG/5ML
0.2500 mg | Freq: Once | INTRAVENOUS | Status: AC
  Administered 2018-09-07: 0.25 mg via INTRAVENOUS

## 2018-09-07 MED ORDER — HEPARIN SOD (PORK) LOCK FLUSH 100 UNIT/ML IV SOLN
500.0000 [IU] | Freq: Once | INTRAVENOUS | Status: AC | PRN
  Administered 2018-09-07: 15:00:00 500 [IU]
  Filled 2018-09-07: qty 5

## 2018-09-07 MED ORDER — SODIUM CHLORIDE 0.9% FLUSH
10.0000 mL | INTRAVENOUS | Status: DC | PRN
  Administered 2018-09-07: 15:00:00 10 mL
  Filled 2018-09-07: qty 10

## 2018-09-07 MED ORDER — SODIUM CHLORIDE 0.9 % IV SOLN
12.5000 mg/m2 | Freq: Once | INTRAVENOUS | Status: AC
  Administered 2018-09-07: 14:00:00 21 mg via INTRAVENOUS
  Filled 2018-09-07: qty 21

## 2018-09-07 NOTE — Progress Notes (Signed)
Okay to treat with HGB of 7.6 per Cira Rue, NP.

## 2018-09-07 NOTE — Telephone Encounter (Signed)
Scheduled appt per 4/21 los. °

## 2018-09-07 NOTE — Telephone Encounter (Signed)
Per Regan Rakers pt is receiving blood at 7:30am Thursday 12/09/18. Pt made aware of appointment

## 2018-09-07 NOTE — Patient Instructions (Signed)
Coronavirus (COVID-19) Are you at risk?  Are you at risk for the Coronavirus (COVID-19)?  To be considered HIGH RISK for Coronavirus (COVID-19), you have to meet the following criteria:  . Traveled to [place], [place], [place], [place] or [place]; or in the [place] to [place], [place], [place], or [place]; and have fever, cough, and shortness of breath within the last 2 weeks of travel OR . Been in close contact with a person diagnosed with COVID-19 within the last 2 weeks and have fever, cough, and shortness of breath . IF YOU DO NOT MEET THESE CRITERIA, YOU ARE CONSIDERED LOW RISK FOR COVID-19.  What to do if you are HIGH RISK for COVID-19?  . If you are having a medical emergency, call 911. . Seek medical care right away. Before you go to a doctor's office, urgent care or emergency department, call ahead and tell them about your recent travel, contact with someone diagnosed with COVID-19, and your symptoms. You should receive instructions from your physician's office regarding next steps of care.  . When you arrive at healthcare provider, tell the healthcare staff immediately you have returned from visiting [place], [place], [place], [place] or [place]; or traveled in the [place] to [place], [place], [place], or [place]; in the last two weeks or you have been in close contact with a person diagnosed with COVID-19 in the last 2 weeks.   . Tell the health care staff about your symptoms: fever, cough and shortness of breath. . After you have been seen by a medical provider, you will be either: o Tested for (COVID-19) and discharged home on quarantine except to seek medical care if symptoms worsen, and asked to  - Stay home and avoid contact with others until you get your results (4-5 days)  - Avoid travel on public transportation if possible (such as bus, train, or airplane) or o Sent to the Emergency Department by EMS for evaluation, COVID-19 testing, and possible  admission depending on your condition and test results.  What to do if you are LOW RISK for COVID-19?  Reduce your risk of any infection by using the same precautions used for avoiding the common cold or flu:  . Wash your hands often with soap and warm water for at least 20 seconds.  If soap and water are not readily available, use an alcohol-based hand sanitizer with at least 60% alcohol.  . If coughing or sneezing, cover your mouth and nose by coughing or sneezing into the elbow areas of your shirt or coat, into a tissue or into your sleeve (not your hands). . Avoid shaking hands with others and consider head nods or verbal greetings only. . Avoid touching your eyes, nose, or mouth with unwashed hands.  . Avoid close contact with people who are sick. . Avoid places or events with large numbers of people in one location, like concerts or sporting events. . Carefully consider travel plans you have or are making. . If you are planning any travel outside or inside the [place], visit the CDC's Travelers' Health webpage for the latest health notices. . If you have some symptoms but not all symptoms, continue to monitor at home and seek medical attention if your symptoms worsen. . If you are having a medical emergency, call 911.   ADDITIONAL HEALTHCARE OPTIONS FOR PATIENTS  [facility] / e-Visit: [email or web address]         [facility]: [phone]  Onset   Urgent Care: [facility]: [place] Discharge Instructions for Patients Receiving Chemotherapy  Today you received the following chemotherapy agents: Gemcitabine (Gemzar) and Cisplatin (Platinol)  To help prevent nausea and vomiting after your treatment, we encourage you to take your nausea medication as directed.    If you develop nausea and vomiting that is not controlled by your nausea  medication, call the clinic.   BELOW ARE SYMPTOMS THAT SHOULD BE REPORTED IMMEDIATELY:  *FEVER GREATER THAN 100.5 F  *CHILLS WITH OR WITHOUT FEVER  NAUSEA AND VOMITING THAT IS NOT CONTROLLED WITH YOUR NAUSEA MEDICATION  *UNUSUAL SHORTNESS OF BREATH  *UNUSUAL BRUISING OR BLEEDING  TENDERNESS IN MOUTH AND THROAT WITH OR WITHOUT PRESENCE OF ULCERS  *URINARY PROBLEMS  *BOWEL PROBLEMS  UNUSUAL RASH Items with * indicate a potential emergency and should be followed up as soon as possible.  Feel free to call the clinic should you have any questions or concerns. The clinic phone number is [phone].  Please show the [facility] at check-in to the Emergency Department and triage nurse.

## 2018-09-08 LAB — PREPARE RBC (CROSSMATCH)

## 2018-09-09 ENCOUNTER — Other Ambulatory Visit: Payer: Self-pay

## 2018-09-09 ENCOUNTER — Inpatient Hospital Stay: Payer: Medicare Other

## 2018-09-09 DIAGNOSIS — Z5111 Encounter for antineoplastic chemotherapy: Secondary | ICD-10-CM | POA: Diagnosis not present

## 2018-09-09 DIAGNOSIS — C221 Intrahepatic bile duct carcinoma: Secondary | ICD-10-CM

## 2018-09-09 MED ORDER — SODIUM CHLORIDE 0.9% FLUSH
10.0000 mL | INTRAVENOUS | Status: DC | PRN
  Filled 2018-09-09: qty 10

## 2018-09-09 MED ORDER — SODIUM CHLORIDE 0.9% IV SOLUTION
250.0000 mL | Freq: Once | INTRAVENOUS | Status: DC
  Filled 2018-09-09: qty 250

## 2018-09-09 MED ORDER — HEPARIN SOD (PORK) LOCK FLUSH 100 UNIT/ML IV SOLN
500.0000 [IU] | Freq: Every day | INTRAVENOUS | Status: DC | PRN
  Filled 2018-09-09: qty 5

## 2018-09-09 MED ORDER — HEPARIN SOD (PORK) LOCK FLUSH 100 UNIT/ML IV SOLN
250.0000 [IU] | INTRAVENOUS | Status: DC | PRN
  Filled 2018-09-09: qty 5

## 2018-09-09 NOTE — Patient Instructions (Signed)
Blood Transfusion, Adult, Care After This sheet gives you information about how to care for yourself after your procedure. Your doctor may also give you more specific instructions. If you have problems or questions, contact your doctor. Follow these instructions at home:   Take over-the-counter and prescription medicines only as told by your doctor.  Go back to your normal activities as told by your doctor.  Follow instructions from your doctor about how to take care of the area where an IV tube was put into your vein (insertion site). Make sure you: ? Wash your hands with soap and water before you change your bandage (dressing). If there is no soap and water, use hand sanitizer. ? Change your bandage as told by your doctor.  Check your IV insertion site every day for signs of infection. Check for: ? More redness, swelling, or pain. ? More fluid or blood. ? Warmth. ? Pus or a bad smell. Contact a doctor if:  You have more redness, swelling, or pain around the IV insertion site.  You have more fluid or blood coming from the IV insertion site.  Your IV insertion site feels warm to the touch.  You have pus or a bad smell coming from the IV insertion site.  Your pee (urine) turns pink, red, or brown.  You feel weak after doing your normal activities. Get help right away if:  You have signs of a serious allergic or body defense (immune) system reaction, including: ? Itchiness. ? Hives. ? Trouble breathing. ? Anxiety. ? Pain in your chest or lower back. ? Fever, flushing, and chills. ? Fast pulse. ? Rash. ? Watery poop (diarrhea). ? Throwing up (vomiting). ? Dark pee. ? Serious headache. ? Dizziness. ? Stiff neck. ? Yellow color in your face or the white parts of your eyes (jaundice). Summary  After a blood transfusion, return to your normal activities as told by your doctor.  Every day, check for signs of infection where the IV tube was put into your vein.  Some  signs of infection are warm skin, more redness and pain, more fluid or blood, and pus or a bad smell where the needle went in.  Contact your doctor if you feel weak or have any unusual symptoms. This information is not intended to replace advice given to you by your health care provider. Make sure you discuss any questions you have with your health care provider. Document Released: 05/26/2014 Document Revised: 12/28/2015 Document Reviewed: 12/28/2015 Elsevier Interactive Patient Education  2019 [employer]  

## 2018-09-10 ENCOUNTER — Telehealth: Payer: Self-pay | Admitting: Hematology

## 2018-09-10 ENCOUNTER — Encounter: Payer: Self-pay | Admitting: General Practice

## 2018-09-10 LAB — TYPE AND SCREEN
ABO/RH(D): O POS
Antibody Screen: NEGATIVE
Unit division: 0

## 2018-09-10 LAB — BPAM RBC
Blood Product Expiration Date: 202004302359
ISSUE DATE / TIME: 202004230755
Unit Type and Rh: 5100

## 2018-09-10 NOTE — Progress Notes (Signed)
[facility] Team contacted patient to assess for food insecurity and other psychosocial needs during current COVID19 pandemic.  Has dialysis Mon/Wed/Fri.  Per daughter, "she is managing very well, doing Zoom and HouseParty" thanks to granddaughter.    Patient/family expressed no needs at this time.  Support Team member encouraged patient to call if changes occur or they have any other questions/concerns.    Beverely Pace, [place]

## 2018-09-10 NOTE — Telephone Encounter (Signed)
Per sch msg. Patient is not able to come in on mondays due dialysis. Moved any Monday appts to Tuesday. Called and confirmed changes with patient

## 2018-09-20 ENCOUNTER — Other Ambulatory Visit: Payer: Medicare Other

## 2018-09-20 ENCOUNTER — Ambulatory Visit: Payer: Medicare Other | Admitting: Nurse Practitioner

## 2018-09-21 ENCOUNTER — Inpatient Hospital Stay: Payer: Medicare Other

## 2018-09-21 ENCOUNTER — Encounter: Payer: Self-pay | Admitting: Nurse Practitioner

## 2018-09-21 ENCOUNTER — Inpatient Hospital Stay (HOSPITAL_BASED_OUTPATIENT_CLINIC_OR_DEPARTMENT_OTHER): Payer: Medicare Other | Admitting: Nurse Practitioner

## 2018-09-21 ENCOUNTER — Other Ambulatory Visit: Payer: Self-pay

## 2018-09-21 ENCOUNTER — Inpatient Hospital Stay: Payer: Medicare Other | Attending: Hematology

## 2018-09-21 ENCOUNTER — Telehealth: Payer: Self-pay

## 2018-09-21 VITALS — BP 165/87 | HR 72 | Temp 98.1°F | Resp 18 | Wt 142.2 lb

## 2018-09-21 DIAGNOSIS — N186 End stage renal disease: Secondary | ICD-10-CM

## 2018-09-21 DIAGNOSIS — R05 Cough: Secondary | ICD-10-CM | POA: Insufficient documentation

## 2018-09-21 DIAGNOSIS — Z87891 Personal history of nicotine dependence: Secondary | ICD-10-CM

## 2018-09-21 DIAGNOSIS — I120 Hypertensive chronic kidney disease with stage 5 chronic kidney disease or end stage renal disease: Secondary | ICD-10-CM | POA: Diagnosis not present

## 2018-09-21 DIAGNOSIS — Z95828 Presence of other vascular implants and grafts: Secondary | ICD-10-CM

## 2018-09-21 DIAGNOSIS — D6481 Anemia due to antineoplastic chemotherapy: Secondary | ICD-10-CM | POA: Insufficient documentation

## 2018-09-21 DIAGNOSIS — Z79899 Other long term (current) drug therapy: Secondary | ICD-10-CM

## 2018-09-21 DIAGNOSIS — C221 Intrahepatic bile duct carcinoma: Secondary | ICD-10-CM

## 2018-09-21 DIAGNOSIS — D631 Anemia in chronic kidney disease: Secondary | ICD-10-CM | POA: Diagnosis not present

## 2018-09-21 DIAGNOSIS — Z992 Dependence on renal dialysis: Secondary | ICD-10-CM | POA: Diagnosis not present

## 2018-09-21 DIAGNOSIS — Z5111 Encounter for antineoplastic chemotherapy: Secondary | ICD-10-CM | POA: Insufficient documentation

## 2018-09-21 DIAGNOSIS — D490 Neoplasm of unspecified behavior of digestive system: Secondary | ICD-10-CM

## 2018-09-21 DIAGNOSIS — K6389 Other specified diseases of intestine: Secondary | ICD-10-CM

## 2018-09-21 LAB — CBC WITH DIFFERENTIAL (CANCER CENTER ONLY)
Abs Immature Granulocytes: 0.03 10*3/uL (ref 0.00–0.07)
Basophils Absolute: 0 10*3/uL (ref 0.0–0.1)
Basophils Relative: 0 %
Eosinophils Absolute: 3.2 10*3/uL — ABNORMAL HIGH (ref 0.0–0.5)
Eosinophils Relative: 33 %
HCT: 28.2 % — ABNORMAL LOW (ref 36.0–46.0)
Hemoglobin: 8.9 g/dL — ABNORMAL LOW (ref 12.0–15.0)
Immature Granulocytes: 0 %
Lymphocytes Relative: 15 %
Lymphs Abs: 1.5 10*3/uL (ref 0.7–4.0)
MCH: 34.4 pg — ABNORMAL HIGH (ref 26.0–34.0)
MCHC: 31.6 g/dL (ref 30.0–36.0)
MCV: 108.9 fL — ABNORMAL HIGH (ref 80.0–100.0)
Monocytes Absolute: 0.7 10*3/uL (ref 0.1–1.0)
Monocytes Relative: 7 %
Neutro Abs: 4.3 10*3/uL (ref 1.7–7.7)
Neutrophils Relative %: 45 %
Platelet Count: 210 10*3/uL (ref 150–400)
RBC: 2.59 MIL/uL — ABNORMAL LOW (ref 3.87–5.11)
RDW: 15.5 % (ref 11.5–15.5)
WBC Count: 9.7 10*3/uL (ref 4.0–10.5)
nRBC: 0 % (ref 0.0–0.2)

## 2018-09-21 LAB — CMP (CANCER CENTER ONLY)
ALT: 8 U/L (ref 0–44)
AST: 19 U/L (ref 15–41)
Albumin: 3 g/dL — ABNORMAL LOW (ref 3.5–5.0)
Alkaline Phosphatase: 130 U/L — ABNORMAL HIGH (ref 38–126)
Anion gap: 13 (ref 5–15)
BUN: 36 mg/dL — ABNORMAL HIGH (ref 8–23)
CO2: 26 mmol/L (ref 22–32)
Calcium: 7.8 mg/dL — ABNORMAL LOW (ref 8.9–10.3)
Chloride: 102 mmol/L (ref 98–111)
Creatinine: 6.65 mg/dL (ref 0.44–1.00)
GFR, Est AFR Am: 7 mL/min — ABNORMAL LOW (ref >60)
GFR, Estimated: 6 mL/min — ABNORMAL LOW (ref >60)
Glucose, Bld: 84 mg/dL (ref 70–99)
Potassium: 4.6 mmol/L (ref 3.5–5.1)
Sodium: 141 mmol/L (ref 135–145)
Total Bilirubin: 0.3 mg/dL (ref 0.3–1.2)
Total Protein: 7 g/dL (ref 6.5–8.1)

## 2018-09-21 MED ORDER — SODIUM CHLORIDE 0.9 % IV SOLN
Freq: Once | INTRAVENOUS | Status: AC
  Administered 2018-09-21: 09:00:00 via INTRAVENOUS
  Filled 2018-09-21: qty 250

## 2018-09-21 MED ORDER — PALONOSETRON HCL INJECTION 0.25 MG/5ML
0.2500 mg | Freq: Once | INTRAVENOUS | Status: AC
  Administered 2018-09-21: 0.25 mg via INTRAVENOUS

## 2018-09-21 MED ORDER — HEPARIN SOD (PORK) LOCK FLUSH 100 UNIT/ML IV SOLN
500.0000 [IU] | Freq: Once | INTRAVENOUS | Status: AC | PRN
  Administered 2018-09-21: 13:00:00 500 [IU]
  Filled 2018-09-21: qty 5

## 2018-09-21 MED ORDER — SODIUM CHLORIDE 0.9% FLUSH
10.0000 mL | INTRAVENOUS | Status: DC | PRN
  Administered 2018-09-21: 08:00:00 10 mL
  Filled 2018-09-21: qty 10

## 2018-09-21 MED ORDER — SODIUM CHLORIDE 0.9% FLUSH
10.0000 mL | INTRAVENOUS | Status: DC | PRN
  Administered 2018-09-21: 10 mL
  Filled 2018-09-21: qty 10

## 2018-09-21 MED ORDER — SODIUM CHLORIDE 0.9 % IV SOLN
500.0000 mg/m2 | Freq: Once | INTRAVENOUS | Status: AC
  Administered 2018-09-21: 836 mg via INTRAVENOUS
  Filled 2018-09-21: qty 21.99

## 2018-09-21 MED ORDER — SODIUM CHLORIDE 0.9 % IV SOLN
12.5000 mg/m2 | Freq: Once | INTRAVENOUS | Status: AC
  Administered 2018-09-21: 21 mg via INTRAVENOUS
  Filled 2018-09-21: qty 21

## 2018-09-21 MED ORDER — SODIUM CHLORIDE 0.9 % IV SOLN
Freq: Once | INTRAVENOUS | Status: AC
  Administered 2018-09-21: 10:00:00 via INTRAVENOUS
  Filled 2018-09-21: qty 5

## 2018-09-21 MED ORDER — PALONOSETRON HCL INJECTION 0.25 MG/5ML
INTRAVENOUS | Status: AC
  Filled 2018-09-21: qty 5

## 2018-09-21 NOTE — Patient Instructions (Signed)
[facility] Discharge Instructions for Patients Receiving Chemotherapy  Today you received the following chemotherapy agents Gemzar, Cisplatin  To help prevent nausea and vomiting after your treatment, we encourage you to take your nausea medication as directed   If you develop nausea and vomiting that is not controlled by your nausea medication, call the clinic.   BELOW ARE SYMPTOMS THAT SHOULD BE REPORTED IMMEDIATELY:  *FEVER GREATER THAN 100.5 F  *CHILLS WITH OR WITHOUT FEVER  NAUSEA AND VOMITING THAT IS NOT CONTROLLED WITH YOUR NAUSEA MEDICATION  *UNUSUAL SHORTNESS OF BREATH  *UNUSUAL BRUISING OR BLEEDING  TENDERNESS IN MOUTH AND THROAT WITH OR WITHOUT PRESENCE OF ULCERS  *URINARY PROBLEMS  *BOWEL PROBLEMS  UNUSUAL RASH Items with * indicate a potential emergency and should be followed up as soon as possible.  Feel free to call the clinic should you have any questions or concerns. The clinic phone number is [phone].  Please show the CHEMO ALERT CARD at check-in to the Emergency Department and triage nurse.   

## 2018-09-21 NOTE — Progress Notes (Signed)
[place]   [phone] [phone]) [phone]   Clinic Follow up Note   Patient Care Team: Glendon Axe, MD as PCP - General (Family Medicine) [name], MD as PCP - Cardiology (Cardiology) Dwana Melena, MD as Attending Physician (Nephrology) Truitt Merle, MD as Consulting Physician (Hematology) 09/21/2018  CHIEF COMPLAINT: f/u intrahepatic cholangiocarcinoma   SUMMARY OF ONCOLOGIC HISTORY:   Intrahepatic cholangiocarcinoma ([place])   02/23/2018 Imaging    CT AP W Contrast 02/23/18  IMPRESSION: 1. Large multi-cystic lesion in the central aspect of the liver adjacent to the gallbladder fossa, with several smaller satellite lesions. These are all new compared to prior CT the chest, abdomen and pelvis 08/07/2016, concerning for intrahepatic abscesses. 2. Extensive mural thickening and inflammatory changes in the region of the cecum. This is nonspecific, and could reflect either right-sided diverticulitis, focal area of colitis, or potentially even underlying neoplasm. 3. Cholelithiasis. Gallbladder is nearly completely contracted without surrounding inflammatory changes to suggest an acute cholecystitis at this time. 4. Left-sided nephrolithiasis measuring up to 8 mm in the upper pole collecting system of left kidney. No ureteral stones or findings of urinary tract obstruction. 5. Aortic atherosclerosis.    02/27/2018 Initial Biopsy    Diagnosis 02/27/18  Liver, needle/core biopsy, Right Hepatic Lobe - ADENOCARCINOMA. SEE NOTE.    02/27/2018 Miscellaneous       03/05/2018 Procedure    Colonoscopy 03/05/18  IMPRESSION - Preparation of the colon was fair. - Non-thrombosed external hemorrhoids found on digital rectal exam. - There was significant looping of the colon. - Severe diverticulosis in the recto-sigmoid colon, in the sigmoid colon, in the descending colon, in the transverse colon, at the hepatic flexure, in the ascending colon and in the  cecum. There was no evidence of diverticular bleeding. - A single (solitary) ulcer in the cecum - highly concerning for underlying malignancy. Biopsied. Phlegmonous change is possible, though often in setting of complicated diverticulosis/diverticulitis ulceration would not be as common. - Erythematous mucosa in the transverse colon, at the hepatic flexure, in the ascending colon and in the cecum. Biopsied. - Normal mucosa in the rectum, in the recto-sigmoid colon, in the sigmoid colon and in the descending colon. Biopsied. - Non-bleeding non-thrombosed external and internal hemorrhoids. -----Negative for malignancy     03/11/2018 Initial Diagnosis    Intrahepatic cholangiocarcinoma ([place])    04/02/2018 Imaging    CT CAP W contrast 04/02/18  IMPRESSION: 1. 4 mm subpleural nodule in the periphery of the right lower lobe. This is nonspecific, but strongly favored to represent a benign subpleural lymph node. Attention on follow-up studies is recommended to exclude the possibility of metastatic disease. 2. Diffuse bronchial wall thickening with moderate centrilobular and mild paraseptal emphysema; imaging findings suggestive of underlying COPD. 3. Aortic atherosclerosis, in addition to left main and left anterior descending coronary artery disease. Assessment for potential risk factor modification, dietary therapy or pharmacologic therapy may be warranted, if clinically indicated. 4. Nonobstructive calculi in the upper pole collecting system of left kidney measuring up to 7 mm.     04/27/2018 Cancer Staging    Staging form: Intrahepatic Bile Duct, AJCC 8th Edition - Clinical: Stage IIIB (cT2, cN1, cM0) - Signed by Truitt Merle, MD on 04/27/2018    05/18/2018 -  Chemotherapy    Gemcitabine and Cisplatin every 2 weeksstarting on 05/18/2018, with 50% dose reduction    07/19/2018 Imaging    CT CAP W CONTRAST  IMPRESSION: 1. Dominant inferior liver mass is  decreased in size. Two  subcentimeter clustered low-attenuation lesions in the far inferior right liver lobe, not discretely visualized on the prior noncontrast CT study, can not exclude new small satellite hepatic metastases. 2. Porta hepatis adenopathy is mildly decreased. 3. Small right pulmonary nodules are stable. 4. No additional potential new or progressive metastatic disease. 5. Small pericardial effusion, slightly increased. 6. Cholelithiasis. 7. Moderate diffuse colonic diverticulosis. 8. Aortic Atherosclerosis (ICD10-I70.0) and Emphysema (ICD10-J43.9).      CURRENT THERAPY: CURRENT THERAPY:  Gemcitabine and Cisplatin every 2 weeksstartingon 05/18/2018, with dose reduction S/p cycle 9 on 09/07/18   INTERVAL HISTORY: Ms. Navarrete returns for follow up as scheduled. She completed cycle 9 cisplatin/gemcitabine on 09/07/18. Last cycle went well. She received RBC transfusion for Hgb 7.6 on 09/09/18. She notes her fatigued improved after transfusion. She functions well at home. Her appetite continues to improve. Drinks 1 nutrition supplement per day. She limits fluids due to dialysis. Denies mouth sores. Denies n/v/c/d. Takes miralax daily. Denies blood in stool or urine. Denies abdominal pain. She has intermittent productive cough that has been going on "since this year". She is a former smoker. No sick contacts or known COVID exposure. She practices social distancing, wears mask, and only goes to medical appointments here and dialysis. Dr. Augustin Coupe ordered a chest xray which she will [facility] at [facility]. Denies fever, chest pain, dyspnea, wheezes, lower extremity swelling. Denies neuropathy. BP has been high lately, she started amlodipine recently.   REVIEW OF SYSTEMS:   Constitutional: Denies fevers, chills or abnormal weight loss (+) weight fluctuates (+) appetite improved (+) fatigue improved  Eyes: Denies blurriness of vision or vision change  Ears, nose, mouth, throat, and face: Denies mucositis or  sore throat Respiratory: Denies dyspnea or wheezes (+) intermittent productive cough  Cardiovascular: Denies palpitation, chest discomfort or lower extremity swelling Gastrointestinal:  Denies nausea, vomiting, constipation, diarrhea, GI bleeding, abdominal pain, heartburn or change in bowel habits GU: Denies hematuria (+) low urine output at baseline  Skin: Denies abnormal skin rashes Lymphatics: Denies new lymphadenopathy or easy bruising Neurological:Denies numbness, tingling or new weaknesses (+) occasional mild headache  Behavioral/Psych: Mood is stable, no new changes  All other systems were reviewed with the patient and are negative.  MEDICAL HISTORY:  Past Medical History:  Diagnosis Date  . Diverticulitis   . Focal segmental glomerulosclerosis    ESRD on MWF HD  . Hypertension   . intrahepatic cholangio ca dx'd10/2019  . Renal insufficiency    dialysis pt MWF  . Tobacco abuse     SURGICAL HISTORY: Past Surgical History:  Procedure Laterality Date  . AV FISTULA PLACEMENT Left 03/18/2017   Procedure: Brachiocephalic ARTERIOVENOUS (AV) FISTULA CREATION left arm;  Surgeon: Elam Dutch, MD;  Location: [place];  Service: Vascular;  Laterality: Left;  . BIOPSY  03/05/2018   Procedure: BIOPSY;  Surgeon: Rush Landmark Telford Nab., MD;  Location: [place];  Service: Gastroenterology;;  enteroscopy bx /cytology brushing Greig Castilla bx  . COLONOSCOPY WITH PROPOFOL N/A 03/05/2018   Procedure: COLONOSCOPY WITH PROPOFOL;  Surgeon: Rush Landmark Telford Nab., MD;  Location: [place];  Service: Gastroenterology;  Laterality: N/A;  Bx, Spot  . ENTEROSCOPY N/A 03/05/2018   Procedure: ENTEROSCOPY;  Surgeon: Rush Landmark Telford Nab., MD;  Location: [place];  Service: Gastroenterology;  Laterality: N/A;  BX, Brushing, & Spot  . INSERTION OF DIALYSIS CATHETER N/A 03/18/2017   Procedure: INSERTION OF DIALYSIS CATHETER;  Surgeon: Elam Dutch, MD;  Location: [place];  Service: Vascular;   Laterality: N/A;  . IR GENERIC HISTORICAL  08/15/2016   IR US GUIDE VASC ACCESS RIGHT 08/15/2016 Arne Cleveland, MD [facility]INTER[facility]  . IR GENERIC HISTORICAL  08/15/2016   IR FLUORO GUIDE CV LINE RIGHT 08/15/2016 Arne Cleveland, MD [facility]  . IR IMAGING GUIDED PORT INSERTION  05/06/2018  . SUBMUCOSAL INJECTION  03/05/2018   Procedure: SUBMUCOSAL INJECTION;  Surgeon: Rush Landmark Telford Nab., MD;  Location: [place];  Service: Gastroenterology;;  spot tatoo  . TUBAL LIGATION      I have reviewed the social history and family history with the patient and they are unchanged from previous note.  ALLERGIES:  has No Known Allergies.  MEDICATIONS:  Current Outpatient Medications  Medication Sig Dispense Refill  . amLODipine (NORVASC) 10 MG tablet     . folic acid (FOLVITE) 1 MG tablet Take 1 tablet (1 mg total) by mouth daily. 30 tablet 0  . lidocaine-prilocaine (EMLA) cream Apply to affected area once 30 g 3  . metoprolol tartrate (LOPRESSOR) 25 MG tablet Take 0.5 tablets (12.5 mg total) by mouth 2 (two) times daily. 30 tablet 0  . multivitamin (RENA-VIT) TABS tablet Take 1 tablet by mouth at bedtime. 30 tablet 3  . Nutritional Supplements (FEEDING SUPPLEMENT, NEPRO CARB STEADY,) LIQD Take 237 mLs by mouth 3 (three) times daily as needed (Supplement). 90 Can 0  . pantoprazole (PROTONIX) 40 MG tablet TAKE 1 TABLET BY MOUTH TWICE A DAY 60 tablet 1  . polyethylene glycol (MIRALAX) packet Take 17 g by mouth daily. 30 each 0  . senna-docusate (SENOKOT-S) 8.6-50 MG tablet Take 2 tablets by mouth 2 (two) times daily. 30 tablet 0  . Vitamin D, Ergocalciferol, (DRISDOL) 50000 units CAPS capsule Take 50,000 Units by mouth once a week.  5  . nicotine (NICODERM CQ - DOSED IN MG/24 HOURS) 21 mg/24hr patch Place 1 patch (21 mg total) onto the skin daily. 28 patch 0  . ondansetron (ZOFRAN) 8 MG tablet Take 1 tablet (8 mg total) by mouth 2 (two) times daily as needed. Start on the third day after  chemotherapy. 30 tablet 1  . oxyCODONE (OXY IR/ROXICODONE) 5 MG immediate release tablet Take 1 tablet (5 mg total) by mouth every 6 (six) hours as needed for severe pain. 30 tablet 0  . prochlorperazine (COMPAZINE) 10 MG tablet Take 1 tablet (10 mg total) by mouth every 6 (six) hours as needed (Nausea or vomiting). 30 tablet 1   No current facility-administered medications for this visit.    Facility-Administered Medications Ordered in Other Visits  Medication Dose Route Frequency Provider Last Rate Last Dose  . CISplatin (PLATINOL) 21 mg in sodium chloride 0.9 % 250 mL chemo infusion  12.5 mg/m2 (Treatment Plan Recorded) Intravenous Once Truitt Merle, MD      . gemcitabine (GEMZAR) 836 mg in sodium chloride 0.9 % 250 mL chemo infusion  500 mg/m2 (Treatment Plan Recorded) Intravenous Once Truitt Merle, MD 544 mL/hr at 09/21/18 1157 836 mg at 09/21/18 1157  . heparin lock flush 100 unit/mL  500 Units Intracatheter Once PRN Truitt Merle, MD      . sodium chloride flush (NS) 0.9 % injection 10 mL  10 mL Intracatheter PRN Truitt Merle, MD   10 mL at 07/27/18 1745  . sodium chloride flush (NS) 0.9 % injection 10 mL  10 mL Intracatheter PRN Truitt Merle, MD        PHYSICAL EXAMINATION: ECOG PERFORMANCE STATUS: 1 - Symptomatic but completely  ambulatory  Vitals:   09/21/18 0823  BP: (!) 165/87  Pulse: 72  Resp: 18  Temp: 98.1 F (36.7 C)  SpO2: 100%   Filed Weights   09/21/18 0823  Weight: 142 lb 3 oz (64.5 kg)    GENERAL:alert, no distress and comfortable SKIN: no rashes EYES:  sclera clear OROPHARYNX:no thrush or ulcers  LUNGS: distant, slight congestion clears with cough. normal breathing effort HEART: regular rate & rhythm, no lower extremity edema ABDOMEN:  normal bowel sounds Musculoskeletal:no cyanosis of digits  NEURO: alert & oriented x 3 with fluent speech PAC without erythema   LABORATORY DATA:  I have reviewed the data as listed CBC Latest Ref Rng & Units 09/21/2018 09/07/2018  08/23/2018  WBC 4.0 - 10.5 K/uL 9.7 12.7(H) 10.3  Hemoglobin 12.0 - 15.0 g/dL 8.9(L) 7.6(L) 9.2(L)  Hematocrit 36.0 - 46.0 % 28.2(L) 24.3(L) 28.6(L)  Platelets 150 - 400 K/uL 210 207 149(L)     CMP Latest Ref Rng & Units 09/21/2018 09/07/2018 08/23/2018  Glucose 70 - 99 mg/dL 84 56(L) 109(H)  BUN 8 - 23 mg/dL 36(H) 39(H) 26(H)  Creatinine 0.44 - 1.00 mg/dL 6.65(HH) 5.38(HH) 4.98(HH)  Sodium 135 - 145 mmol/L 141 140 139  Potassium 3.5 - 5.1 mmol/L 4.6 4.0 3.8  Chloride 98 - 111 mmol/L 102 102 97(L)  CO2 22 - 32 mmol/L _0 Calcium 8.9 - 10.3 mg/dL 7.8(L) 7.5(L) 8.3(L)  Total Protein 6.5 - 8.1 g/dL 7.0 6.4(L) 7.2  Total Bilirubin 0.3 - 1.2 mg/dL 0.3 0.3 0.4  Alkaline Phos 38 - 126 U/L 130(H) 125 126  AST 15 - 41 U/L 19 12(L) 12(L)  ALT 0 - 44 U/L 8 7 <6      RADIOGRAPHIC STUDIES: I have personally reviewed the radiological images as listed and agreed with the findings in the report. No results found.   ASSESSMENT & PLAN: Jaylene Arrowood a 73 y.o.femalewith   1. Adenocarcinomainliver, likelyintrahepatic cholangiocarcinoma, cT2N1M0 stage IIIB, unresectable, MSI-Stable -Initially diagnosed in 02/2018; she was seen by Dr. Cloyd Stagers at [facility] and deemed not a surgical candidate. She began first line systemic chemotherapy with dose-reduced gemcitabine and cisplatin on 05/18/2018. She required RBC transfusion after cycle 1 and cycle 9 (hgb 7.6) but otherwise tolerated well.  -clinically her abdominal pain improved, today she denies pain and does not use oxycodone -Restaging CT on 07/19/2018 was previously reviewed, showed partial response. She continues chemotherapy Q2 weeks.  -Today she appears stable. She continues to tolerate chemotherapy well overall.  -labs reviewed. CA 19-9 pending.  -Plan to restage in 3-4 months from last imaging as long as she continues to tolerate chemo well, CA 19-9 is stable, and no significant clinical changes  -she will proceed with cycle 10  cisplatin/gemcitabine at current doses. She will f/u in 2 weeks with next cycle   2. Productive cough  -intermittent and ongoing for 4-5 months  -no sick contacts or known covid exposure. Normal WBC. -chest xray per Dr. Augustin Coupe to be done 09/22/18 at [facility]  -CT chest dating to 07/2016 and as recent as 07/2018 shows moderate emphysema. She is a former smoker.  -will monitor closely   3. HTN  -Currently on metoprolol.Dr. Augustin Coupe recently added amlodipine  -BP 165/87 today. Monitoring   4. End Stage Renal Disease, on HD MWF -followed by nephrologist Dr. Augustin Coupe  5. Anemia of chronic diseaseand chemo induced anemia -Due to CKD and malignancy -s/p RBC transfusion after cycle 1 and cycle 9 (hgb 7.6  and symptomatic)  -Hgb 8.9 today  5.Goal of care discussion - treatment is palliative; full code   Plan -labs reviewed, proceed with cycle 10 cisplatin/gemcitabine today -chest Xray per Dr. Augustin Coupe 09/22/18, will request report -return in 2 weeks for lab, f/u, and next cycle   All questions were answered. The patient knows to call the clinic with any problems, questions or concerns. No barriers to learning was detected.     Alla Feeling, NP 09/21/18

## 2018-09-21 NOTE — Telephone Encounter (Signed)
Critical value call from Mary Chapman in the lab on Mary Chapman creatinine 6.65 [name] notified

## 2018-09-22 ENCOUNTER — Telehealth: Payer: Self-pay | Admitting: Nurse Practitioner

## 2018-09-22 LAB — CANCER ANTIGEN 19-9: CA 19-9: 29 U/mL (ref 0–35)

## 2018-09-22 NOTE — Telephone Encounter (Signed)
Scheduled appt per 5/5 los.

## 2018-09-23 ENCOUNTER — Other Ambulatory Visit: Payer: Self-pay

## 2018-09-23 ENCOUNTER — Other Ambulatory Visit: Payer: Self-pay | Admitting: Nephrology

## 2018-09-23 ENCOUNTER — Ambulatory Visit
Admission: RE | Admit: 2018-09-23 | Discharge: 2018-09-23 | Disposition: A | Discharge status: Home / Self Care | Payer: Medicare Other | Source: Ambulatory Visit | Attending: Nephrology | Admitting: Nephrology

## 2018-09-23 DIAGNOSIS — R05 Cough: Secondary | ICD-10-CM

## 2018-09-23 DIAGNOSIS — R059 Cough, unspecified: Secondary | ICD-10-CM

## 2018-10-02 ENCOUNTER — Other Ambulatory Visit: Payer: Self-pay | Admitting: Hematology

## 2018-10-04 ENCOUNTER — Ambulatory Visit: Payer: Medicare Other | Admitting: Hematology

## 2018-10-04 ENCOUNTER — Other Ambulatory Visit: Payer: Medicare Other

## 2018-10-05 ENCOUNTER — Telehealth: Payer: Self-pay | Admitting: *Deleted

## 2018-10-05 ENCOUNTER — Inpatient Hospital Stay: Payer: Medicare Other

## 2018-10-05 ENCOUNTER — Telehealth: Payer: Self-pay | Admitting: Nurse Practitioner

## 2018-10-05 ENCOUNTER — Encounter: Payer: Self-pay | Admitting: Nurse Practitioner

## 2018-10-05 ENCOUNTER — Inpatient Hospital Stay (HOSPITAL_BASED_OUTPATIENT_CLINIC_OR_DEPARTMENT_OTHER): Payer: Medicare Other | Admitting: Nurse Practitioner

## 2018-10-05 ENCOUNTER — Other Ambulatory Visit: Payer: Self-pay

## 2018-10-05 VITALS — BP 159/80 | HR 69 | Temp 99.1°F | Resp 20 | Ht 66.0 in | Wt 140.6 lb

## 2018-10-05 DIAGNOSIS — C221 Intrahepatic bile duct carcinoma: Secondary | ICD-10-CM

## 2018-10-05 DIAGNOSIS — N186 End stage renal disease: Secondary | ICD-10-CM | POA: Diagnosis not present

## 2018-10-05 DIAGNOSIS — D638 Anemia in other chronic diseases classified elsewhere: Secondary | ICD-10-CM

## 2018-10-05 DIAGNOSIS — D490 Neoplasm of unspecified behavior of digestive system: Secondary | ICD-10-CM

## 2018-10-05 DIAGNOSIS — Z5111 Encounter for antineoplastic chemotherapy: Secondary | ICD-10-CM | POA: Diagnosis not present

## 2018-10-05 DIAGNOSIS — Z79899 Other long term (current) drug therapy: Secondary | ICD-10-CM

## 2018-10-05 DIAGNOSIS — R05 Cough: Secondary | ICD-10-CM | POA: Diagnosis not present

## 2018-10-05 DIAGNOSIS — K6389 Other specified diseases of intestine: Secondary | ICD-10-CM

## 2018-10-05 DIAGNOSIS — Z95828 Presence of other vascular implants and grafts: Secondary | ICD-10-CM

## 2018-10-05 DIAGNOSIS — D631 Anemia in chronic kidney disease: Secondary | ICD-10-CM

## 2018-10-05 DIAGNOSIS — I120 Hypertensive chronic kidney disease with stage 5 chronic kidney disease or end stage renal disease: Secondary | ICD-10-CM

## 2018-10-05 DIAGNOSIS — Z87891 Personal history of nicotine dependence: Secondary | ICD-10-CM

## 2018-10-05 DIAGNOSIS — Z992 Dependence on renal dialysis: Secondary | ICD-10-CM

## 2018-10-05 DIAGNOSIS — D6481 Anemia due to antineoplastic chemotherapy: Secondary | ICD-10-CM

## 2018-10-05 LAB — CBC WITH DIFFERENTIAL (CANCER CENTER ONLY)
Abs Immature Granulocytes: 0.03 10*3/uL (ref 0.00–0.07)
Basophils Absolute: 0 10*3/uL (ref 0.0–0.1)
Basophils Relative: 0 %
Eosinophils Absolute: 1.8 10*3/uL — ABNORMAL HIGH (ref 0.0–0.5)
Eosinophils Relative: 22 %
HCT: 25.8 % — ABNORMAL LOW (ref 36.0–46.0)
Hemoglobin: 8.2 g/dL — ABNORMAL LOW (ref 12.0–15.0)
Immature Granulocytes: 0 %
Lymphocytes Relative: 14 %
Lymphs Abs: 1.1 10*3/uL (ref 0.7–4.0)
MCH: 33.7 pg (ref 26.0–34.0)
MCHC: 31.8 g/dL (ref 30.0–36.0)
MCV: 106.2 fL — ABNORMAL HIGH (ref 80.0–100.0)
Monocytes Absolute: 0.8 10*3/uL (ref 0.1–1.0)
Monocytes Relative: 9 %
Neutro Abs: 4.5 10*3/uL (ref 1.7–7.7)
Neutrophils Relative %: 55 %
Platelet Count: 178 10*3/uL (ref 150–400)
RBC: 2.43 MIL/uL — ABNORMAL LOW (ref 3.87–5.11)
RDW: 15.3 % (ref 11.5–15.5)
WBC Count: 8.3 10*3/uL (ref 4.0–10.5)
nRBC: 0 % (ref 0.0–0.2)

## 2018-10-05 LAB — CMP (CANCER CENTER ONLY)
ALT: 8 U/L (ref 0–44)
AST: 13 U/L — ABNORMAL LOW (ref 15–41)
Albumin: 3 g/dL — ABNORMAL LOW (ref 3.5–5.0)
Alkaline Phosphatase: 144 U/L — ABNORMAL HIGH (ref 38–126)
Anion gap: 14 (ref 5–15)
BUN: 34 mg/dL — ABNORMAL HIGH (ref 8–23)
CO2: 25 mmol/L (ref 22–32)
Calcium: 7.7 mg/dL — ABNORMAL LOW (ref 8.9–10.3)
Chloride: 103 mmol/L (ref 98–111)
Creatinine: 6.61 mg/dL (ref 0.44–1.00)
GFR, Est AFR Am: 7 mL/min — ABNORMAL LOW (ref >60)
GFR, Estimated: 6 mL/min — ABNORMAL LOW (ref >60)
Glucose, Bld: 93 mg/dL (ref 70–99)
Potassium: 4 mmol/L (ref 3.5–5.1)
Sodium: 142 mmol/L (ref 135–145)
Total Bilirubin: 0.3 mg/dL (ref 0.3–1.2)
Total Protein: 6.9 g/dL (ref 6.5–8.1)

## 2018-10-05 MED ORDER — PALONOSETRON HCL INJECTION 0.25 MG/5ML
INTRAVENOUS | Status: AC
  Filled 2018-10-05: qty 5

## 2018-10-05 MED ORDER — PALONOSETRON HCL INJECTION 0.25 MG/5ML
0.2500 mg | Freq: Once | INTRAVENOUS | Status: AC
  Administered 2018-10-05: 0.25 mg via INTRAVENOUS

## 2018-10-05 MED ORDER — SODIUM CHLORIDE 0.9 % IV SOLN
Freq: Once | INTRAVENOUS | Status: AC
  Administered 2018-10-05: 10:00:00 via INTRAVENOUS
  Filled 2018-10-05: qty 250

## 2018-10-05 MED ORDER — SODIUM CHLORIDE 0.9% FLUSH
10.0000 mL | INTRAVENOUS | Status: DC | PRN
  Administered 2018-10-05: 13:00:00 10 mL
  Filled 2018-10-05: qty 10

## 2018-10-05 MED ORDER — SODIUM CHLORIDE 0.9 % IV SOLN
12.5000 mg/m2 | Freq: Once | INTRAVENOUS | Status: AC
  Administered 2018-10-05: 12:00:00 21 mg via INTRAVENOUS
  Filled 2018-10-05: qty 21

## 2018-10-05 MED ORDER — SODIUM CHLORIDE 0.9% FLUSH
10.0000 mL | INTRAVENOUS | Status: DC | PRN
  Administered 2018-10-05: 10 mL
  Filled 2018-10-05: qty 10

## 2018-10-05 MED ORDER — SODIUM CHLORIDE 0.9 % IV SOLN
500.0000 mg/m2 | Freq: Once | INTRAVENOUS | Status: AC
  Administered 2018-10-05: 11:00:00 836 mg via INTRAVENOUS
  Filled 2018-10-05: qty 21.99

## 2018-10-05 MED ORDER — SODIUM CHLORIDE 0.9 % IV SOLN
Freq: Once | INTRAVENOUS | Status: AC
  Administered 2018-10-05: 10:00:00 via INTRAVENOUS
  Filled 2018-10-05: qty 5

## 2018-10-05 MED ORDER — HEPARIN SOD (PORK) LOCK FLUSH 100 UNIT/ML IV SOLN
500.0000 [IU] | Freq: Once | INTRAVENOUS | Status: AC | PRN
  Administered 2018-10-05: 13:00:00 500 [IU]
  Filled 2018-10-05: qty 5

## 2018-10-05 NOTE — Patient Instructions (Signed)
[facility] Discharge Instructions for Patients Receiving Chemotherapy  Today you received the following chemotherapy agents Gemzar, Cisplatin  To help prevent nausea and vomiting after your treatment, we encourage you to take your nausea medication as directed   If you develop nausea and vomiting that is not controlled by your nausea medication, call the clinic.   BELOW ARE SYMPTOMS THAT SHOULD BE REPORTED IMMEDIATELY:  *FEVER GREATER THAN 100.5 F  *CHILLS WITH OR WITHOUT FEVER  NAUSEA AND VOMITING THAT IS NOT CONTROLLED WITH YOUR NAUSEA MEDICATION  *UNUSUAL SHORTNESS OF BREATH  *UNUSUAL BRUISING OR BLEEDING  TENDERNESS IN MOUTH AND THROAT WITH OR WITHOUT PRESENCE OF ULCERS  *URINARY PROBLEMS  *BOWEL PROBLEMS  UNUSUAL RASH Items with * indicate a potential emergency and should be followed up as soon as possible.  Feel free to call the clinic should you have any questions or concerns. The clinic phone number is [phone].  Please show the CHEMO ALERT CARD at check-in to the Emergency Department and triage nurse.   

## 2018-10-05 NOTE — Telephone Encounter (Signed)
Scheduled appt per 5/19 los

## 2018-10-05 NOTE — Telephone Encounter (Signed)
Received call from lab @ 9:37 am informing that critical creat result is 6.61.  Cira Rue seeing pt now.

## 2018-10-05 NOTE — Progress Notes (Signed)
[place]   Telephone:([phone]) [phone] Fax:(336) [phone]   Clinic Follow up Note   Patient Care Team: Glendon Axe, MD as PCP - General (Family Medicine) [name], MD as PCP - Cardiology (Cardiology) Dwana Melena, MD as Attending Physician (Nephrology) Truitt Merle, MD as Consulting Physician (Hematology) 10/05/2018  CHIEF COMPLAINT:   SUMMARY OF ONCOLOGIC HISTORY:   Intrahepatic cholangiocarcinoma ([place])   02/23/2018 Imaging    CT AP W Contrast 02/23/18  IMPRESSION: 1. Large multi-cystic lesion in the central aspect of the liver adjacent to the gallbladder fossa, with several smaller satellite lesions. These are all new compared to prior CT the chest, abdomen and pelvis 08/07/2016, concerning for intrahepatic abscesses. 2. Extensive mural thickening and inflammatory changes in the region of the cecum. This is nonspecific, and could reflect either right-sided diverticulitis, focal area of colitis, or potentially even underlying neoplasm. 3. Cholelithiasis. Gallbladder is nearly completely contracted without surrounding inflammatory changes to suggest an acute cholecystitis at this time. 4. Left-sided nephrolithiasis measuring up to 8 mm in the upper pole collecting system of left kidney. No ureteral stones or findings of urinary tract obstruction. 5. Aortic atherosclerosis.    02/27/2018 Initial Biopsy    Diagnosis 02/27/18  Liver, needle/core biopsy, Right Hepatic Lobe - ADENOCARCINOMA. SEE NOTE.    02/27/2018 Miscellaneous       03/05/2018 Procedure    Colonoscopy 03/05/18  IMPRESSION - Preparation of the colon was fair. - Non-thrombosed external hemorrhoids found on digital rectal exam. - There was significant looping of the colon. - Severe diverticulosis in the recto-sigmoid colon, in the sigmoid colon, in the descending colon, in the transverse colon, at the hepatic flexure, in the ascending colon and in the cecum. There was no evidence of  diverticular bleeding. - A single (solitary) ulcer in the cecum - highly concerning for underlying malignancy. Biopsied. Phlegmonous change is possible, though often in setting of complicated diverticulosis/diverticulitis ulceration would not be as common. - Erythematous mucosa in the transverse colon, at the hepatic flexure, in the ascending colon and in the cecum. Biopsied. - Normal mucosa in the rectum, in the recto-sigmoid colon, in the sigmoid colon and in the descending colon. Biopsied. - Non-bleeding non-thrombosed external and internal hemorrhoids. -----Negative for malignancy     03/11/2018 Initial Diagnosis    Intrahepatic cholangiocarcinoma ([place])    04/02/2018 Imaging    CT CAP W contrast 04/02/18  IMPRESSION: 1. 4 mm subpleural nodule in the periphery of the right lower lobe. This is nonspecific, but strongly favored to represent a benign subpleural lymph node. Attention on follow-up studies is recommended to exclude the possibility of metastatic disease. 2. Diffuse bronchial wall thickening with moderate centrilobular and mild paraseptal emphysema; imaging findings suggestive of underlying COPD. 3. Aortic atherosclerosis, in addition to left main and left anterior descending coronary artery disease. Assessment for potential risk factor modification, dietary therapy or pharmacologic therapy may be warranted, if clinically indicated. 4. Nonobstructive calculi in the upper pole collecting system of left kidney measuring up to 7 mm.     04/27/2018 Cancer Staging    Staging form: Intrahepatic Bile Duct, AJCC 8th Edition - Clinical: Stage IIIB (cT2, cN1, cM0) - Signed by Truitt Merle, MD on 04/27/2018    05/18/2018 -  Chemotherapy    Gemcitabine and Cisplatin every 2 weeksstarting on 05/18/2018, with 50% dose reduction    07/19/2018 Imaging    CT CAP W CONTRAST  IMPRESSION: 1. Dominant inferior liver mass is decreased in size.  Two subcentimeter clustered low-attenuation  lesions in the far inferior right liver lobe, not discretely visualized on the prior noncontrast CT study, can not exclude new small satellite hepatic metastases. 2. Porta hepatis adenopathy is mildly decreased. 3. Small right pulmonary nodules are stable. 4. No additional potential new or progressive metastatic disease. 5. Small pericardial effusion, slightly increased. 6. Cholelithiasis. 7. Moderate diffuse colonic diverticulosis. 8. Aortic Atherosclerosis (ICD10-I70.0) and Emphysema (ICD10-J43.9).      CURRENT THERAPY: Gemcitabine and Cisplatin every 2 weeksstartingon 05/18/2018, with dose reduction S/p cycle 10 on 09/21/18    INTERVAL HISTORY: [name] returns for follow up and next chemo as scheduled. She completed cycle 10 cisplatin/gemcitabine on 09/21/18. She had no trouble and feels well. She has no specific complaints today. She reports good appetite. Eats small frequent meals and supplement that she gets at dialysis. Denies fever or chills. Mild fatigue but remains functional. She notes her energy is not low like "when I need blood." Her cough is improving, less productive. She did not get report of CXR on 5/7. Takes miralax PRN. Denies mouth sores, n/v/d, abdominal pain, blood in stool or urine, chest pain, dyspnea, leg swelling, or neuropathy.    REVIEW OF SYSTEMS:   Constitutional: Denies fevers, chills (+) weight loss (+) mild fatigue  Ears, nose, mouth, throat, and face: Denies mucositis or sore throat Respiratory: Denies dyspnea or wheezes (+) productive cough, improving  Cardiovascular: Denies palpitation, chest discomfort or lower extremity swelling Gastrointestinal:  Denies nausea, vomiting, diarrhea, GI bleeding, abd pain, change in bowel habits GU: Denies hematuria or dysuria  Skin: Denies abnormal skin rashes Lymphatics: Denies new lymphadenopathy or easy bruising Neurological:Denies numbness, tingling or new weaknesses Behavioral/Psych: Mood is stable, no new  changes  All other systems were reviewed with the patient and are negative.  MEDICAL HISTORY:  Past Medical History:  Diagnosis Date  . Diverticulitis   . Focal segmental glomerulosclerosis    ESRD on MWF HD  . Hypertension   . intrahepatic cholangio ca dx'd10/2019  . Renal insufficiency    dialysis pt MWF  . Tobacco abuse     SURGICAL HISTORY: Past Surgical History:  Procedure Laterality Date  . AV FISTULA PLACEMENT Left 03/18/2017   Procedure: Brachiocephalic ARTERIOVENOUS (AV) FISTULA CREATION left arm;  Surgeon: Elam Dutch, MD;  Location: [facility];  Service: Vascular;  Laterality: Left;  . BIOPSY  03/05/2018   Procedure: BIOPSY;  Surgeon: Rush Landmark Telford Nab., MD;  Location: [facility];  Service: Gastroenterology;;  enteroscopy bx /cytology brushing Greig Castilla bx  . COLONOSCOPY WITH PROPOFOL N/A 03/05/2018   Procedure: COLONOSCOPY WITH PROPOFOL;  Surgeon: Rush Landmark Telford Nab., MD;  Location: [place] [facility];  Service: Gastroenterology;  Laterality: N/A;  Bx, Spot  . ENTEROSCOPY N/A 03/05/2018   Procedure: ENTEROSCOPY;  Surgeon: Rush Landmark Telford Nab., MD;  Location: [facility];  Service: Gastroenterology;  Laterality: N/A;  BX, Brushing, & Spot  . INSERTION OF DIALYSIS CATHETER N/A 03/18/2017   Procedure: INSERTION OF DIALYSIS CATHETER;  Surgeon: Elam Dutch, MD;  Location: [facility];  Service: Vascular;  Laterality: N/A;  . IR GENERIC HISTORICAL  08/15/2016   IR US GUIDE VASC ACCESS RIGHT 08/15/2016 Arne Cleveland, MD [facility]  . IR GENERIC HISTORICAL  08/15/2016   IR FLUORO GUIDE CV LINE RIGHT 08/15/2016 Arne Cleveland, MD [facility]  . IR IMAGING GUIDED PORT INSERTION  05/06/2018  . SUBMUCOSAL INJECTION  03/05/2018   Procedure: SUBMUCOSAL INJECTION;  Surgeon: Rush Landmark Telford Nab., MD;  Location: [facility];  Service: Gastroenterology;;  spot tatoo  . TUBAL LIGATION      I have reviewed the social history and family history with the patient and they are  unchanged from previous note.  ALLERGIES:  has No Known Allergies.  MEDICATIONS:  Current Outpatient Medications  Medication Sig Dispense Refill  . amLODipine (NORVASC) 10 MG tablet     . lidocaine-prilocaine (EMLA) cream Apply to affected area once 30 g 3  . metoprolol tartrate (LOPRESSOR) 25 MG tablet Take 0.5 tablets (12.5 mg total) by mouth 2 (two) times daily. 30 tablet 0  . multivitamin (RENA-VIT) TABS tablet Take 1 tablet by mouth at bedtime. 30 tablet 3  . Nutritional Supplements (FEEDING SUPPLEMENT, NEPRO CARB STEADY,) LIQD Take 237 mLs by mouth 3 (three) times daily as needed (Supplement). 90 Can 0  . ondansetron (ZOFRAN) 8 MG tablet Take 1 tablet (8 mg total) by mouth 2 (two) times daily as needed. Start on the third day after chemotherapy. 30 tablet 1  . pantoprazole (PROTONIX) 40 MG tablet TAKE 1 TABLET BY MOUTH TWICE A DAY 60 tablet 1  . polyethylene glycol (MIRALAX) packet Take 17 g by mouth daily. 30 each 0  . prochlorperazine (COMPAZINE) 10 MG tablet Take 1 tablet (10 mg total) by mouth every 6 (six) hours as needed (Nausea or vomiting). 30 tablet 1  . senna-docusate (SENOKOT-S) 8.6-50 MG tablet Take 2 tablets by mouth 2 (two) times daily. 30 tablet 0  . Vitamin D, Ergocalciferol, (DRISDOL) 50000 units CAPS capsule Take 50,000 Units by mouth once a week.  5  . folic acid (FOLVITE) 1 MG tablet Take 1 tablet (1 mg total) by mouth daily. 30 tablet 0  . nicotine (NICODERM CQ - DOSED IN MG/24 HOURS) 21 mg/24hr patch Place 1 patch (21 mg total) onto the skin daily. 28 patch 0  . oxyCODONE (OXY IR/ROXICODONE) 5 MG immediate release tablet Take 1 tablet (5 mg total) by mouth every 6 (six) hours as needed for severe pain. 30 tablet 0   No current facility-administered medications for this visit.    Facility-Administered Medications Ordered in Other Visits  Medication Dose Route Frequency Provider Last Rate Last Dose  . CISplatin (PLATINOL) 21 mg in sodium chloride 0.9 % 250 mL chemo  infusion  12.5 mg/m2 (Treatment Plan Recorded) Intravenous Once Truitt Merle, MD      . fosaprepitant (EMEND) 150 mg, dexamethasone (DECADRON) 12 mg in sodium chloride 0.9 % 145 mL IVPB   Intravenous Once Truitt Merle, MD      . gemcitabine (GEMZAR) 836 mg in sodium chloride 0.9 % 100 mL chemo infusion  500 mg/m2 (Treatment Plan Recorded) Intravenous Once Truitt Merle, MD      . heparin lock flush 100 unit/mL  500 Units Intracatheter Once PRN Truitt Merle, MD      . palonosetron (ALOXI) injection 0.25 mg  0.25 mg Intravenous Once Truitt Merle, MD      . sodium chloride flush (NS) 0.9 % injection 10 mL  10 mL Intracatheter PRN Truitt Merle, MD   10 mL at 07/27/18 1745  . sodium chloride flush (NS) 0.9 % injection 10 mL  10 mL Intracatheter PRN Truitt Merle, MD        PHYSICAL EXAMINATION: ECOG PERFORMANCE STATUS: 1 - Symptomatic but completely ambulatory  Vitals:   10/05/18 0915  BP: (!) 159/80  Pulse: 69  Resp: 20  Temp: 99.1 F (37.3 C)  SpO2: 100%   Filed Weights   10/05/18 0915  Weight:  140 lb 9.6 oz (63.8 kg)    GENERAL:alert, no distress and comfortable SKIN: no obvious rash  EYES:  sclera clear LUNGS: respirations even and unlabored  HEART:  no lower extremity edema NEURO: alert & oriented x 3 with fluent speech, normal gait  PAC without erythema  Limited exam due to covid19 outbreak  LABORATORY DATA:  I have reviewed the data as listed CBC Latest Ref Rng & Units 10/05/2018 09/21/2018 09/07/2018  WBC 4.0 - 10.5 K/uL 8.3 9.7 12.7(H)  Hemoglobin 12.0 - 15.0 g/dL 8.2(L) 8.9(L) 7.6(L)  Hematocrit 36.0 - 46.0 % 25.8(L) 28.2(L) 24.3(L)  Platelets 150 - 400 K/uL 178 210 207     CMP Latest Ref Rng & Units 10/05/2018 09/21/2018 09/07/2018  Glucose 70 - 99 mg/dL 93 84 56(L)  BUN 8 - 23 mg/dL 34(H) 36(H) 39(H)  Creatinine 0.44 - 1.00 mg/dL 6.61(HH) 6.65(HH) 5.38(HH)  Sodium 135 - 145 mmol/L 142 141 140  Potassium 3.5 - 5.1 mmol/L 4.0 4.6 4.0  Chloride 98 - 111 mmol/L 103 102 102  CO2 22 - 32 mmol/L  _0 Calcium 8.9 - 10.3 mg/dL 7.7(L) 7.8(L) 7.5(L)  Total Protein 6.5 - 8.1 g/dL 6.9 7.0 6.4(L)  Total Bilirubin 0.3 - 1.2 mg/dL 0.3 0.3 0.3  Alkaline Phos 38 - 126 U/L 144(H) 130(H) 125  AST 15 - 41 U/L 13(L) 19 12(L)  ALT 0 - 44 U/L _1 RADIOGRAPHIC STUDIES: I have personally reviewed the radiological images as listed and agreed with the findings in the report. No results found.   ASSESSMENT & PLAN: Mary Chapman a 73 y.o.femalewith   1. Adenocarcinomainliver, likelyintrahepatic cholangiocarcinoma, cT2N1M0 stage IIIB, unresectable, MSI-Stable -Initially diagnosed in 02/2018; she was seen by Dr. Cloyd Stagers at [facility] and deemed not a surgical candidate. She began first line systemic chemotherapy with dose-reduced gemcitabine and cisplatin on 05/18/2018. She required RBC transfusion after cycle 1 and cycle 9 (hgb 7.6) but otherwise tolerated well.  -her abdominal pain resolved, she does not use oxycodone -Restaging CT on 07/19/2018 was previously reviewed, showed partial response. She continues chemotherapyQ2 weeks.  -Today she appearsstable. She continues to tolerate chemotherapy well overall without significant side effects -labs reviewed, CBC and CMP stable. CA 19-9 on 09/21/18 is 29 in normal range, will continue monitoring -she will proceed with cycle 11 cisplatin/gemcitabine at current doses, due today.  -likely restage in 1-2 months as long as she continues to tolerate treatment well and CA 19-9 remains stable  -She will f/u in 2 weeks with next cycle   2. Productive cough  -intermittent and ongoing for 4-5 months  -CT chest dating back to 07/2016 and as recent as 07/2018 shows moderate emphysema. She is a former smoker.  -no sick contacts or known covid exposure. Normal WBC. -chest xray per Dr. Augustin Coupe on 09/23/18 was negative for active disease, I reviewed this with her today -cough is improving, will monitor closely   3. HTN  -Currently on metoprolol and amlodipine per  Dr. Augustin Coupe -BP 159/80 today. Monitoring   4. End Stage Renal Disease, on HD MWF -followed bynephrologist Dr. Augustin Coupe -Cr. 6.61 today  5. Anemia of chronic diseaseand chemo induced anemia -Due to CKD and malignancy -s/p RBC transfusion after cycle 1 and cycle 9 (hgb 7.6 and symptomatic)  -Dr. Burr Medico previously discussed the role of EPO if her anemia progresses and she requires frequent blood transfusions -Hgb 8.2 today, she is asymptomatic; no RBC -will continue to monitor  closely   5.Goal of care discussion- treatment is palliative; full code  Plan -Labs reviewed  -Proceed with cycle 11 cisplatin and gemcitabine today at current doses -F/u in 2 weeks with next cycle   All questions were answered. The patient knows to call the clinic with any problems, questions or concerns. No barriers to learning was detected.     Alla Feeling, NP 10/05/18

## 2018-10-19 ENCOUNTER — Encounter: Payer: Self-pay | Admitting: Nurse Practitioner

## 2018-10-19 ENCOUNTER — Inpatient Hospital Stay: Payer: Medicare Other

## 2018-10-19 ENCOUNTER — Inpatient Hospital Stay: Payer: Medicare Other | Attending: Hematology

## 2018-10-19 ENCOUNTER — Other Ambulatory Visit: Payer: Self-pay

## 2018-10-19 ENCOUNTER — Inpatient Hospital Stay (HOSPITAL_BASED_OUTPATIENT_CLINIC_OR_DEPARTMENT_OTHER): Payer: Medicare Other | Admitting: Nurse Practitioner

## 2018-10-19 ENCOUNTER — Telehealth: Payer: Self-pay | Admitting: Nurse Practitioner

## 2018-10-19 ENCOUNTER — Telehealth: Payer: Self-pay

## 2018-10-19 VITALS — BP 160/76 | HR 70 | Temp 98.7°F | Resp 18 | Ht 66.0 in | Wt 145.6 lb

## 2018-10-19 DIAGNOSIS — Z95828 Presence of other vascular implants and grafts: Secondary | ICD-10-CM

## 2018-10-19 DIAGNOSIS — T451X5A Adverse effect of antineoplastic and immunosuppressive drugs, initial encounter: Secondary | ICD-10-CM | POA: Diagnosis not present

## 2018-10-19 DIAGNOSIS — Z5111 Encounter for antineoplastic chemotherapy: Secondary | ICD-10-CM | POA: Insufficient documentation

## 2018-10-19 DIAGNOSIS — N186 End stage renal disease: Secondary | ICD-10-CM | POA: Diagnosis not present

## 2018-10-19 DIAGNOSIS — Z992 Dependence on renal dialysis: Secondary | ICD-10-CM | POA: Insufficient documentation

## 2018-10-19 DIAGNOSIS — D490 Neoplasm of unspecified behavior of digestive system: Secondary | ICD-10-CM

## 2018-10-19 DIAGNOSIS — G629 Polyneuropathy, unspecified: Secondary | ICD-10-CM | POA: Diagnosis not present

## 2018-10-19 DIAGNOSIS — Z87891 Personal history of nicotine dependence: Secondary | ICD-10-CM | POA: Insufficient documentation

## 2018-10-19 DIAGNOSIS — Z79899 Other long term (current) drug therapy: Secondary | ICD-10-CM | POA: Insufficient documentation

## 2018-10-19 DIAGNOSIS — D638 Anemia in other chronic diseases classified elsewhere: Secondary | ICD-10-CM

## 2018-10-19 DIAGNOSIS — I10 Essential (primary) hypertension: Secondary | ICD-10-CM | POA: Diagnosis not present

## 2018-10-19 DIAGNOSIS — D6481 Anemia due to antineoplastic chemotherapy: Secondary | ICD-10-CM | POA: Diagnosis not present

## 2018-10-19 DIAGNOSIS — C221 Intrahepatic bile duct carcinoma: Secondary | ICD-10-CM

## 2018-10-19 DIAGNOSIS — Z5112 Encounter for antineoplastic immunotherapy: Secondary | ICD-10-CM | POA: Insufficient documentation

## 2018-10-19 DIAGNOSIS — K6389 Other specified diseases of intestine: Secondary | ICD-10-CM

## 2018-10-19 LAB — CMP (CANCER CENTER ONLY)
ALT: 7 U/L (ref 0–44)
AST: 13 U/L — ABNORMAL LOW (ref 15–41)
Albumin: 2.9 g/dL — ABNORMAL LOW (ref 3.5–5.0)
Alkaline Phosphatase: 131 U/L — ABNORMAL HIGH (ref 38–126)
Anion gap: 11 (ref 5–15)
BUN: 44 mg/dL — ABNORMAL HIGH (ref 8–23)
CO2: 27 mmol/L (ref 22–32)
Calcium: 7.9 mg/dL — ABNORMAL LOW (ref 8.9–10.3)
Chloride: 100 mmol/L (ref 98–111)
Creatinine: 6.82 mg/dL (ref 0.44–1.00)
GFR, Est AFR Am: 6 mL/min — ABNORMAL LOW (ref >60)
GFR, Estimated: 6 mL/min — ABNORMAL LOW (ref >60)
Glucose, Bld: 92 mg/dL (ref 70–99)
Potassium: 4.9 mmol/L (ref 3.5–5.1)
Sodium: 138 mmol/L (ref 135–145)
Total Bilirubin: 0.4 mg/dL (ref 0.3–1.2)
Total Protein: 6.6 g/dL (ref 6.5–8.1)

## 2018-10-19 LAB — CBC WITH DIFFERENTIAL (CANCER CENTER ONLY)
Abs Immature Granulocytes: 0.02 10*3/uL (ref 0.00–0.07)
Basophils Absolute: 0 10*3/uL (ref 0.0–0.1)
Basophils Relative: 0 %
Eosinophils Absolute: 1.1 10*3/uL — ABNORMAL HIGH (ref 0.0–0.5)
Eosinophils Relative: 16 %
HCT: 24.1 % — ABNORMAL LOW (ref 36.0–46.0)
Hemoglobin: 7.6 g/dL — ABNORMAL LOW (ref 12.0–15.0)
Immature Granulocytes: 0 %
Lymphocytes Relative: 16 %
Lymphs Abs: 1.1 10*3/uL (ref 0.7–4.0)
MCH: 34.1 pg — ABNORMAL HIGH (ref 26.0–34.0)
MCHC: 31.5 g/dL (ref 30.0–36.0)
MCV: 108.1 fL — ABNORMAL HIGH (ref 80.0–100.0)
Monocytes Absolute: 0.7 10*3/uL (ref 0.1–1.0)
Monocytes Relative: 10 %
Neutro Abs: 3.9 10*3/uL (ref 1.7–7.7)
Neutrophils Relative %: 58 %
Platelet Count: 168 10*3/uL (ref 150–400)
RBC: 2.23 MIL/uL — ABNORMAL LOW (ref 3.87–5.11)
RDW: 15.2 % (ref 11.5–15.5)
WBC Count: 6.8 10*3/uL (ref 4.0–10.5)
nRBC: 0 % (ref 0.0–0.2)

## 2018-10-19 MED ORDER — PALONOSETRON HCL INJECTION 0.25 MG/5ML
0.2500 mg | Freq: Once | INTRAVENOUS | Status: AC
  Administered 2018-10-19: 0.25 mg via INTRAVENOUS

## 2018-10-19 MED ORDER — PALONOSETRON HCL INJECTION 0.25 MG/5ML
INTRAVENOUS | Status: AC
  Filled 2018-10-19: qty 5

## 2018-10-19 MED ORDER — HEPARIN SOD (PORK) LOCK FLUSH 100 UNIT/ML IV SOLN
500.0000 [IU] | Freq: Once | INTRAVENOUS | Status: AC | PRN
  Administered 2018-10-19: 500 [IU]
  Filled 2018-10-19: qty 5

## 2018-10-19 MED ORDER — SODIUM CHLORIDE 0.9 % IV SOLN
500.0000 mg/m2 | Freq: Once | INTRAVENOUS | Status: AC
  Administered 2018-10-19: 836 mg via INTRAVENOUS
  Filled 2018-10-19: qty 21.99

## 2018-10-19 MED ORDER — SODIUM CHLORIDE 0.9% FLUSH
10.0000 mL | INTRAVENOUS | Status: DC | PRN
  Administered 2018-10-19: 14:00:00 10 mL
  Filled 2018-10-19: qty 10

## 2018-10-19 MED ORDER — SODIUM CHLORIDE 0.9 % IV SOLN
Freq: Once | INTRAVENOUS | Status: AC
  Administered 2018-10-19: 11:00:00 via INTRAVENOUS
  Filled 2018-10-19: qty 5

## 2018-10-19 MED ORDER — PANTOPRAZOLE SODIUM 40 MG PO TBEC: 40.0000 mg | DELAYED_RELEASE_TABLET | Freq: Two times a day (BID) | ORAL | 1 refills | Status: DC

## 2018-10-19 MED ORDER — SODIUM CHLORIDE 0.9% FLUSH
10.0000 mL | INTRAVENOUS | Status: DC | PRN
  Administered 2018-10-19: 10 mL
  Filled 2018-10-19: qty 10

## 2018-10-19 MED ORDER — SODIUM CHLORIDE 0.9 % IV SOLN
Freq: Once | INTRAVENOUS | Status: AC
  Administered 2018-10-19: 11:00:00 via INTRAVENOUS
  Filled 2018-10-19: qty 250

## 2018-10-19 MED ORDER — SODIUM CHLORIDE 0.9 % IV SOLN
12.5000 mg/m2 | Freq: Once | INTRAVENOUS | Status: AC
  Administered 2018-10-19: 21 mg via INTRAVENOUS
  Filled 2018-10-19: qty 21

## 2018-10-19 NOTE — Progress Notes (Signed)
[place]   Telephone:([phone]) [phone] [phone]   Clinic Follow up Note   Patient Care Team: Mary Axe, MD as PCP - General (Family Medicine) Mary Pain, MD as PCP - Cardiology (Cardiology) Mary Melena, MD as Attending Physician (Nephrology) Mary Merle, MD as Consulting Physician (Hematology) 10/19/2018  CHIEF COMPLAINT: f/u intrahepatic cholangiocarcinoma   SUMMARY OF ONCOLOGIC HISTORY:   Intrahepatic cholangiocarcinoma ([place])   02/23/2018 Imaging    CT AP W Contrast 02/23/18  IMPRESSION: 1. Large multi-cystic lesion in the central aspect of the liver adjacent to the gallbladder fossa, with several smaller satellite lesions. These are all new compared to prior CT the chest, abdomen and pelvis 08/07/2016, concerning for intrahepatic abscesses. 2. Extensive mural thickening and inflammatory changes in the region of the cecum. This is nonspecific, and could reflect either right-sided diverticulitis, focal area of colitis, or potentially even underlying neoplasm. 3. Cholelithiasis. Gallbladder is nearly completely contracted without surrounding inflammatory changes to suggest an acute cholecystitis at this time. 4. Left-sided nephrolithiasis measuring up to 8 mm in the upper pole collecting system of left kidney. No ureteral stones or findings of urinary tract obstruction. 5. Aortic atherosclerosis.    02/27/2018 Initial Biopsy    Diagnosis 02/27/18  Liver, needle/core biopsy, Right Hepatic Lobe - ADENOCARCINOMA. SEE NOTE.    02/27/2018 Miscellaneous       03/05/2018 Procedure    Colonoscopy 03/05/18  IMPRESSION - Preparation of the colon was fair. - Non-thrombosed external hemorrhoids found on digital rectal exam. - There was significant looping of the colon. - Severe diverticulosis in the recto-sigmoid colon, in the sigmoid colon, in the descending colon, in the transverse colon, at the hepatic flexure, in the ascending colon and in the  cecum. There was no evidence of diverticular bleeding. - A single (solitary) ulcer in the cecum - highly concerning for underlying malignancy. Biopsied. Phlegmonous change is possible, though often in setting of complicated diverticulosis/diverticulitis ulceration would not be as common. - Erythematous mucosa in the transverse colon, at the hepatic flexure, in the ascending colon and in the cecum. Biopsied. - Normal mucosa in the rectum, in the recto-sigmoid colon, in the sigmoid colon and in the descending colon. Biopsied. - Non-bleeding non-thrombosed external and internal hemorrhoids. -----Negative for malignancy     03/11/2018 Initial Diagnosis    Intrahepatic cholangiocarcinoma ([place])    04/02/2018 Imaging    CT CAP W contrast 04/02/18  IMPRESSION: 1. 4 mm subpleural nodule in the periphery of the right lower lobe. This is nonspecific, but strongly favored to represent a benign subpleural lymph node. Attention on follow-up studies is recommended to exclude the possibility of metastatic disease. 2. Diffuse bronchial wall thickening with moderate centrilobular and mild paraseptal emphysema; imaging findings suggestive of underlying COPD. 3. Aortic atherosclerosis, in addition to left main and left anterior descending coronary artery disease. Assessment for potential risk factor modification, dietary therapy or pharmacologic therapy may be warranted, if clinically indicated. 4. Nonobstructive calculi in the upper pole collecting system of left kidney measuring up to 7 mm.     04/27/2018 Cancer Staging    Staging form: Intrahepatic Bile Duct, AJCC 8th Edition - Clinical: Stage IIIB (cT2, cN1, cM0) - Signed by Mary Merle, MD on 04/27/2018    05/18/2018 -  Chemotherapy    Gemcitabine and Cisplatin every 2 weeksstarting on 05/18/2018, with 50% dose reduction    07/19/2018 Imaging    CT CAP W CONTRAST  IMPRESSION: 1. Dominant inferior liver mass is  decreased in size. Two  subcentimeter clustered low-attenuation lesions in the far inferior right liver lobe, not discretely visualized on the prior noncontrast CT study, can not exclude new small satellite hepatic metastases. 2. Porta hepatis adenopathy is mildly decreased. 3. Small right pulmonary nodules are stable. 4. No additional potential new or progressive metastatic disease. 5. Small pericardial effusion, slightly increased. 6. Cholelithiasis. 7. Moderate diffuse colonic diverticulosis. 8. Aortic Atherosclerosis (ICD10-I70.0) and Emphysema (ICD10-J43.9).      CURRENT THERAPY: Gemcitabine and Cisplatin every 2 weeksstartingon 05/18/2018, with dose reduction S/p cycle 11 on 10/05/18   INTERVAL HISTORY: [name] returns for follow up and chemo as scheduled. She completed cycle 11 on 10/05/18. She feels OK today. Continues dialysis. Energy level is lower than usual, but still able to get tasks done. Sleeps a lot, especially during dialysis. Appetite is good, has gained some weight. Denies n/v/c/d. Has occasional abdominal bloating and dull Chapman in RUQ but overall better from before starting treatment. Takes protonix which helps. Cough is "much better;" has occasional DOE. Denies chest Chapman. Denies leg swelling. Denies neuropathy. Denies bleeding.     MEDICAL HISTORY:  Past Medical History:  Diagnosis Date  . Diverticulitis   . Focal segmental glomerulosclerosis    ESRD on MWF HD  . Hypertension   . intrahepatic cholangio ca dx'd10/2019  . Renal insufficiency    dialysis pt MWF  . Tobacco abuse     SURGICAL HISTORY: Past Surgical History:  Procedure Laterality Date  . AV FISTULA PLACEMENT Left 03/18/2017   Procedure: Brachiocephalic ARTERIOVENOUS (AV) FISTULA CREATION left arm;  Surgeon: Mary Dutch, MD;  Location: [facility];  Service: Vascular;  Laterality: Left;  . BIOPSY  03/05/2018   Procedure: BIOPSY;  Surgeon: Mary Landmark Telford Nab., MD;  Location: [facility];  Service:  Gastroenterology;;  enteroscopy bx /cytology brushing Mary Chapman bx  . COLONOSCOPY WITH PROPOFOL N/A 03/05/2018   Procedure: COLONOSCOPY WITH PROPOFOL;  Surgeon: Mary Landmark Telford Nab., MD;  Location: [facility];  Service: Gastroenterology;  Laterality: N/A;  Bx, Spot  . ENTEROSCOPY N/A 03/05/2018   Procedure: ENTEROSCOPY;  Surgeon: Mary Landmark Telford Nab., MD;  Location: [facility];  Service: Gastroenterology;  Laterality: N/A;  BX, Brushing, & Spot  . INSERTION OF DIALYSIS CATHETER N/A 03/18/2017   Procedure: INSERTION OF DIALYSIS CATHETER;  Surgeon: Mary Dutch, MD;  Location: [facility];  Service: Vascular;  Laterality: N/A;  . IR GENERIC HISTORICAL  08/15/2016   IR US GUIDE VASC ACCESS RIGHT 08/15/2016 Arne Cleveland, MD [facility]  . IR GENERIC HISTORICAL  08/15/2016   IR FLUORO GUIDE CV LINE RIGHT 08/15/2016 Arne Cleveland, MD [facility]  . IR IMAGING GUIDED PORT INSERTION  05/06/2018  . SUBMUCOSAL INJECTION  03/05/2018   Procedure: SUBMUCOSAL INJECTION;  Surgeon: Mary Landmark Telford Nab., MD;  Location: [facility];  Service: Gastroenterology;;  spot tatoo  . TUBAL LIGATION      I have reviewed the social history and family history with the patient and they are unchanged from previous note.  ALLERGIES:  has No Known Allergies.  MEDICATIONS:  Current Outpatient Medications  Medication Sig Dispense Refill  . amLODipine (NORVASC) 10 MG tablet     . folic acid (FOLVITE) 1 MG tablet Take 1 tablet (1 mg total) by mouth daily. 30 tablet 0  . lidocaine-prilocaine (EMLA) cream Apply to affected area once 30 g 3  . metoprolol tartrate (LOPRESSOR) 25 MG tablet Take 0.5 tablets (12.5 mg total) by mouth 2 (two) times daily. 30 tablet  0  . multivitamin (RENA-VIT) TABS tablet Take 1 tablet by mouth at bedtime. 30 tablet 3  . nicotine (NICODERM CQ - DOSED IN MG/24 HOURS) 21 mg/24hr patch Place 1 patch (21 mg total) onto the skin daily. 28 patch 0  . Nutritional Supplements (FEEDING  SUPPLEMENT, NEPRO CARB STEADY,) LIQD Take 237 mLs by mouth 3 (three) times daily as needed (Supplement). 90 Can 0  . ondansetron (ZOFRAN) 8 MG tablet Take 1 tablet (8 mg total) by mouth 2 (two) times daily as needed. Start on the third day after chemotherapy. 30 tablet 1  . pantoprazole (PROTONIX) 40 MG tablet Take 1 tablet (40 mg total) by mouth 2 (two) times daily. 60 tablet 1  . polyethylene glycol (MIRALAX) packet Take 17 g by mouth daily. 30 each 0  . prochlorperazine (COMPAZINE) 10 MG tablet Take 1 tablet (10 mg total) by mouth every 6 (six) hours as needed (Nausea or vomiting). 30 tablet 1  . senna-docusate (SENOKOT-S) 8.6-50 MG tablet Take 2 tablets by mouth 2 (two) times daily. 30 tablet 0  . Vitamin D, Ergocalciferol, (DRISDOL) 50000 units CAPS capsule Take 50,000 Units by mouth once a week.  5  . oxyCODONE (OXY IR/ROXICODONE) 5 MG immediate release tablet Take 1 tablet (5 mg total) by mouth every 6 (six) hours as needed for severe Chapman. 30 tablet 0   No current facility-administered medications for this visit.    Facility-Administered Medications Ordered in Other Visits  Medication Dose Route Frequency Provider Last Rate Last Dose  . 0.9 %  sodium chloride infusion   Intravenous Once Mary Merle, MD      . CISplatin (PLATINOL) 21 mg in sodium chloride 0.9 % 250 mL chemo infusion  12.5 mg/m2 (Treatment Plan Recorded) Intravenous Once Mary Merle, MD      . fosaprepitant (EMEND) 150 mg, dexamethasone (DECADRON) 12 mg in sodium chloride 0.9 % 145 mL IVPB   Intravenous Once Mary Merle, MD      . gemcitabine (GEMZAR) 836 mg in sodium chloride 0.9 % 250 mL chemo infusion  500 mg/m2 (Treatment Plan Recorded) Intravenous Once Mary Merle, MD      . heparin lock flush 100 unit/mL  500 Units Intracatheter Once PRN Mary Merle, MD      . palonosetron (ALOXI) injection 0.25 mg  0.25 mg Intravenous Once Mary Merle, MD      . sodium chloride flush (NS) 0.9 % injection 10 mL  10 mL Intracatheter PRN Mary Merle,  MD   10 mL at 07/27/18 1745  . sodium chloride flush (NS) 0.9 % injection 10 mL  10 mL Intracatheter PRN Mary Merle, MD        PHYSICAL EXAMINATION: ECOG PERFORMANCE STATUS: 1 - Symptomatic but completely ambulatory  Vitals:   10/19/18 0904 10/19/18 0908  BP: (!) 165/81 (!) 160/76  Pulse: 70   Resp: 18   Temp: 98.7 F (37.1 C)   SpO2: 100%    Filed Weights   10/19/18 0904  Weight: 145 lb 9.6 oz (66 kg)    GENERAL:alert, no distress and comfortable SKIN: no obvious rash  EYES:  sclera clear LUNGS: respirations even and unlabored  HEART: no lower extremity edema ABDOMEN: abdomen soft, mild hepatomegaly with slight tenderness  Musculoskeletal:no cyanosis of digits and no clubbing  NEURO: alert & oriented x 3 with fluent speech, normal gait PAC without erythema  Limited exam for covid19 outbreak   LABORATORY DATA:  I have reviewed the data as listed CBC Latest  Ref Rng & Units 10/19/2018 10/05/2018 09/21/2018  WBC 4.0 - 10.5 K/uL 6.8 8.3 9.7  Hemoglobin 12.0 - 15.0 g/dL 7.6(L) 8.2(L) 8.9(L)  Hematocrit 36.0 - 46.0 % 24.1(L) 25.8(L) 28.2(L)  Platelets 150 - 400 K/uL 168 178 210     CMP Latest Ref Rng & Units 10/19/2018 10/05/2018 09/21/2018  Glucose 70 - 99 mg/dL 92 93 84  BUN 8 - 23 mg/dL 44(H) 34(H) 36(H)  Creatinine 0.44 - 1.00 mg/dL 6.82(HH) 6.61(HH) 6.65(HH)  Sodium 135 - 145 mmol/L 138 142 141  Potassium 3.5 - 5.1 mmol/L 4.9 4.0 4.6  Chloride 98 - 111 mmol/L 100 103 102  CO2 22 - 32 mmol/L _0 Calcium 8.9 - 10.3 mg/dL 7.9(L) 7.7(L) 7.8(L)  Total Protein 6.5 - 8.1 g/dL 6.6 6.9 7.0  Total Bilirubin 0.3 - 1.2 mg/dL 0.4 0.3 0.3  Alkaline Phos 38 - 126 U/L 131(H) 144(H) 130(H)  AST 15 - 41 U/L 13(L) 13(L) 19  ALT 0 - 44 U/L _1 RADIOGRAPHIC STUDIES: I have personally reviewed the radiological images as listed and agreed with the findings in the report. No results found.   ASSESSMENT & PLAN: Mary Chapman a 73 y.o.femalewith   1.  Adenocarcinomainliver, likelyintrahepatic cholangiocarcinoma, cT2N1M0 stage IIIB, unresectable, MSI-Stable -Initially diagnosed in 02/2018; she was seen by Dr. Cloyd Stagers at [facility] and deemed not a surgical candidate. She began first line systemic chemotherapy with dose-reduced gemcitabine and cisplatin on 05/18/2018. She required RBC transfusion after cycle 1and cycle 9 (hgb 7.6)but otherwise tolerated well.  -her abdominal Chapman resolved after starting chemotherapy, she does not use oxycodone -Restaging CT on 07/19/2018 was previously reviewed, showed partial response. She continues chemotherapyQ2 weeks.  -Today she appearsstable. She continues to tolerate chemotherapy moderately well overall with mild to moderate fatigue. She remains functional and able to complete ADLs and all tasks.  -Today she notes occasional mild dull ache in her RUQ, not significant Chapman, and overall better from before she began treatment. Controlled with pantoprazole, I refilled for her -labs reviewed. Hgb 7.6, she is mildly symptomatic with fatigue and DOE. Given the covid19 outbreak the blood supply is critically low. Will hold blood transfusion for now. Cr. 6.82 which is trending up lately, CMP otherwise stable. Last two CA 19-9 readings in normal range. Pending today, will monitor.  -she will proceed with cycle 12 cisplatin/gemcitabine at current doses  -will restage after this cycle -f/u with Dr. Burr Medico in 2 weeks   2. Productive cough  -intermittent and ongoing since beginning of this year  -CT chest dating back to 07/2016 and as recent as 07/2018 shows moderate emphysema. She is a former smoker.  -no sick contacts or known covid exposure. Wears mask at dialysis.  -chest xray per Dr. Augustin Coupe on 09/23/18 was negative for active disease -cough continues improving, will monitor closely  3. HTN  -Currently on metoprolol and amlodipine per Dr. Augustin Coupe -BP 160/77. Monitoring  4. End Stage Renal Disease, on HD MWF -followed  bynephrologist Dr. Augustin Coupe -Cr. 6.82 today, trending up  -OK to treat with chemotherapy   5. Anemia of chronic diseaseand chemo induced anemia -Due to CKD and malignancy -s/p RBC transfusion after cycle 1and cycle 9 (hgb 7.6 and symptomatic) -Dr. Burr Medico previously discussed the role of EPO if her anemia progresses and she requires frequent blood transfusions. I again discussed this today and reviewed risks of potential tumor growth and thrombotic event. She understands -Hgb7.6today, mildly symptomatic with fatigue  and DOE. Given critical blood shortage will hold transfusion for now. No EPO -I reviewed with Dr. Burr Medico -will continue to monitor closely   5.Goal of care discussion- treatment is palliative; full code  Plan -Labs reviewed, CA 19-9 pending. OK to treat  -Proceed with cycle 12 cisplatin and gemcitabine today, no dosage adjustment  -Restage after this cycle, CT ordered today  -F/u in 2 weeks with CT result and next cycle  -Refilled pantoprazole   Orders Placed This Encounter  Procedures  . CT Abdomen Pelvis Wo Contrast    Standing Status:   Future    Standing Expiration Date:   10/19/2019    Order Specific Question:   Preferred imaging location?    Answer:   [facility]    Order Specific Question:   Is Oral Contrast requested for this exam?    Answer:   No oral contrast    Order Specific Question:   Reason for No Oral Contrast    Answer:   Other    Order Specific Question:   Please answer why no oral contrast is requested    Answer:   ESRD    Order Specific Question:   Radiology Contrast Protocol - do NOT remove file path    Answer:   \\[email or web address]  . CT Chest Wo Contrast    Standing Status:   Future    Standing Expiration Date:   10/19/2019    Order Specific Question:   Preferred imaging location?    Answer:   [facility]    Order Specific Question:   Radiology Contrast Protocol - do NOT remove file path    Answer:    \\[email or web address]    All questions were answered. The patient knows to call the clinic with any problems, questions or concerns. No barriers to learning was detected.     Alla Feeling, NP 10/19/18

## 2018-10-19 NOTE — Progress Notes (Signed)
Per Cira Rue NP okay to treat with hemoglobin 7.6

## 2018-10-19 NOTE — Telephone Encounter (Signed)
Rescheduled appts per 6/2 los,  Added treatment in the book for approval. (6/18)

## 2018-10-19 NOTE — Patient Instructions (Signed)
[facility] Discharge Instructions for Patients Receiving Chemotherapy  Today you received the following chemotherapy agents Gemzar and Cisplatin  To help prevent nausea and vomiting after your treatment, we encourage you to take your nausea medication as directed   If you develop nausea and vomiting that is not controlled by your nausea medication, call the clinic.   BELOW ARE SYMPTOMS THAT SHOULD BE REPORTED IMMEDIATELY:  *FEVER GREATER THAN 100.5 F  *CHILLS WITH OR WITHOUT FEVER  NAUSEA AND VOMITING THAT IS NOT CONTROLLED WITH YOUR NAUSEA MEDICATION  *UNUSUAL SHORTNESS OF BREATH  *UNUSUAL BRUISING OR BLEEDING  TENDERNESS IN MOUTH AND THROAT WITH OR WITHOUT PRESENCE OF ULCERS  *URINARY PROBLEMS  *BOWEL PROBLEMS  UNUSUAL RASH Items with * indicate a potential emergency and should be followed up as soon as possible.  Feel free to call the clinic should you have any questions or concerns. The clinic phone number is [phone].  Please show the CHEMO ALERT CARD at check-in to the Emergency Department and triage nurse.   

## 2018-10-19 NOTE — Telephone Encounter (Signed)
Critical lab report received from lab. Creatine 6.82 [name] aware !

## 2018-10-20 LAB — CANCER ANTIGEN 19-9: CA 19-9: 34 U/mL (ref 0–35)

## 2018-10-30 IMAGING — DX DG CHEST 2V
2 series · 2 of 2 positions shown · non-contrast
Comparison: None.

CLINICAL DATA: Acute onset of shortness of breath, cough and chest
congestion. Nausea. Bilateral lower extremity swelling. Initial
encounter.

EXAM:
CHEST  2 VIEW

[chest pa]
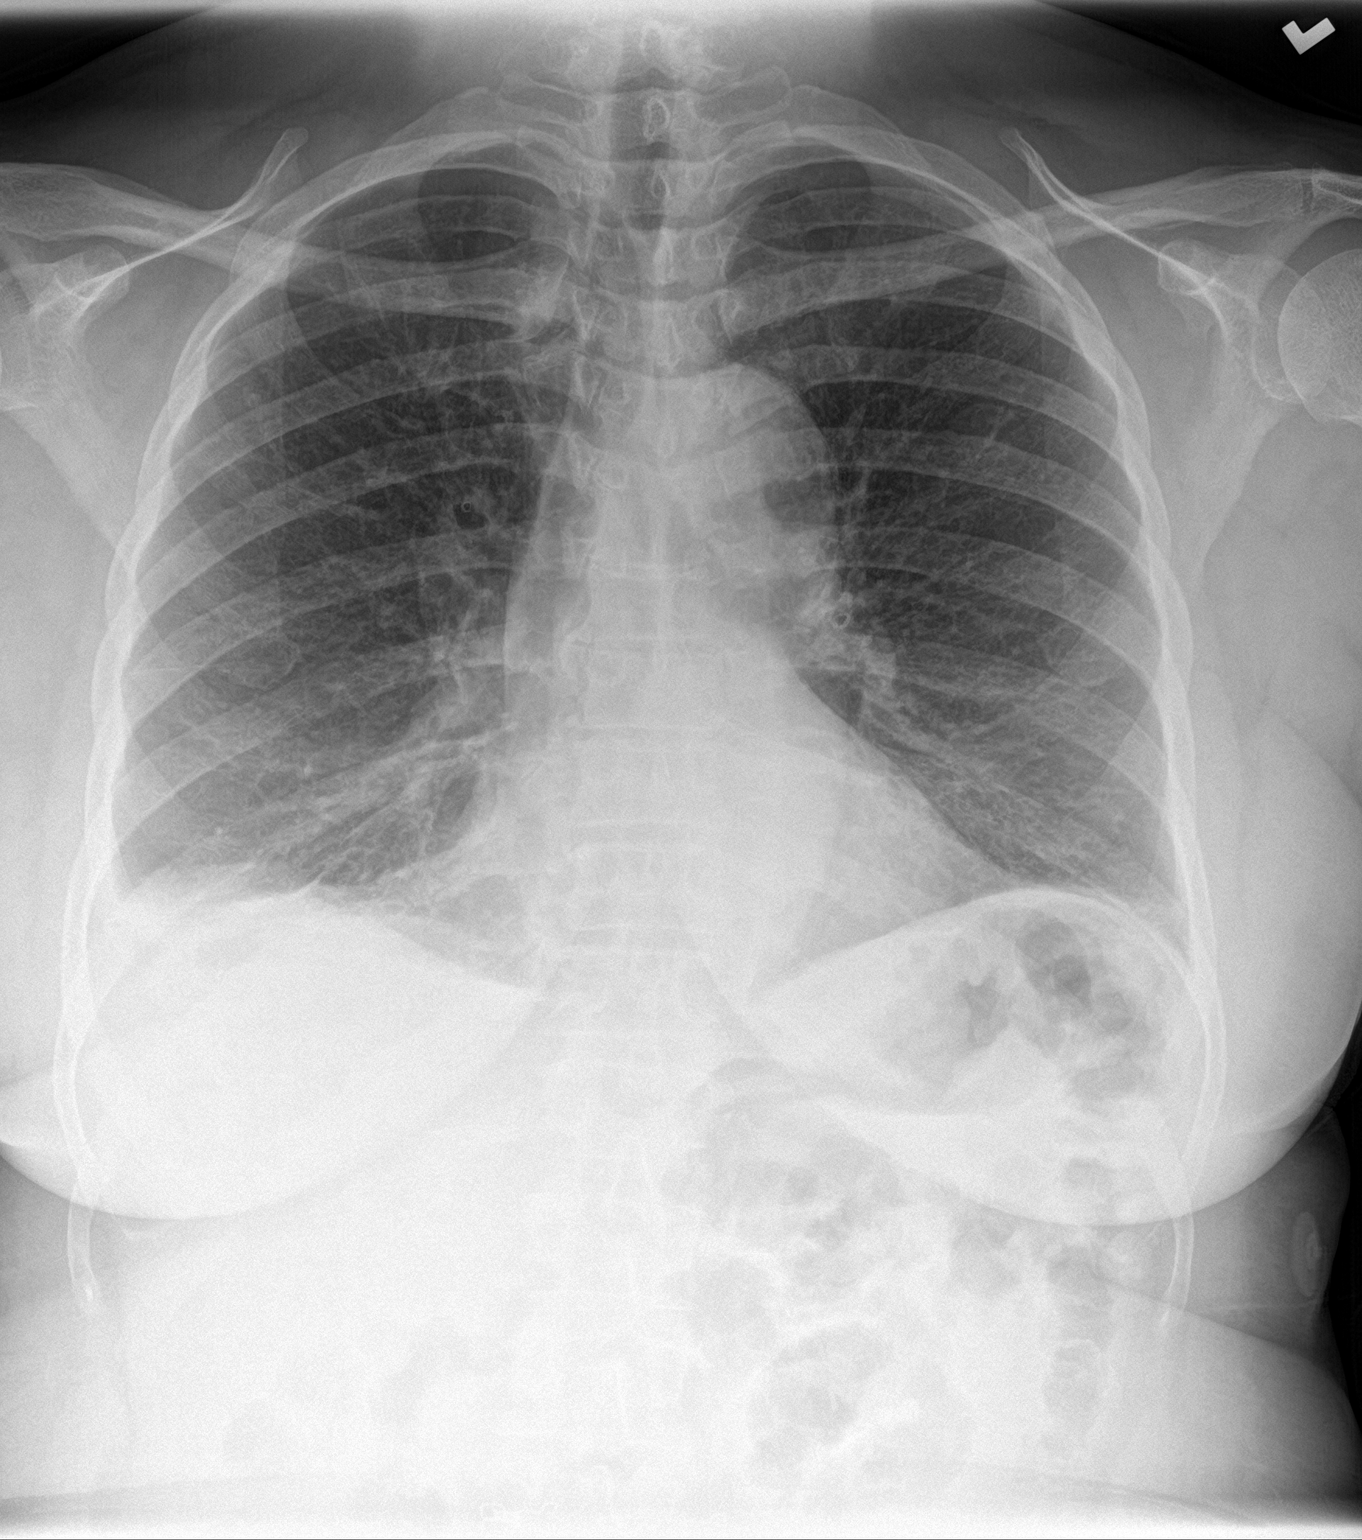

[chest lat]
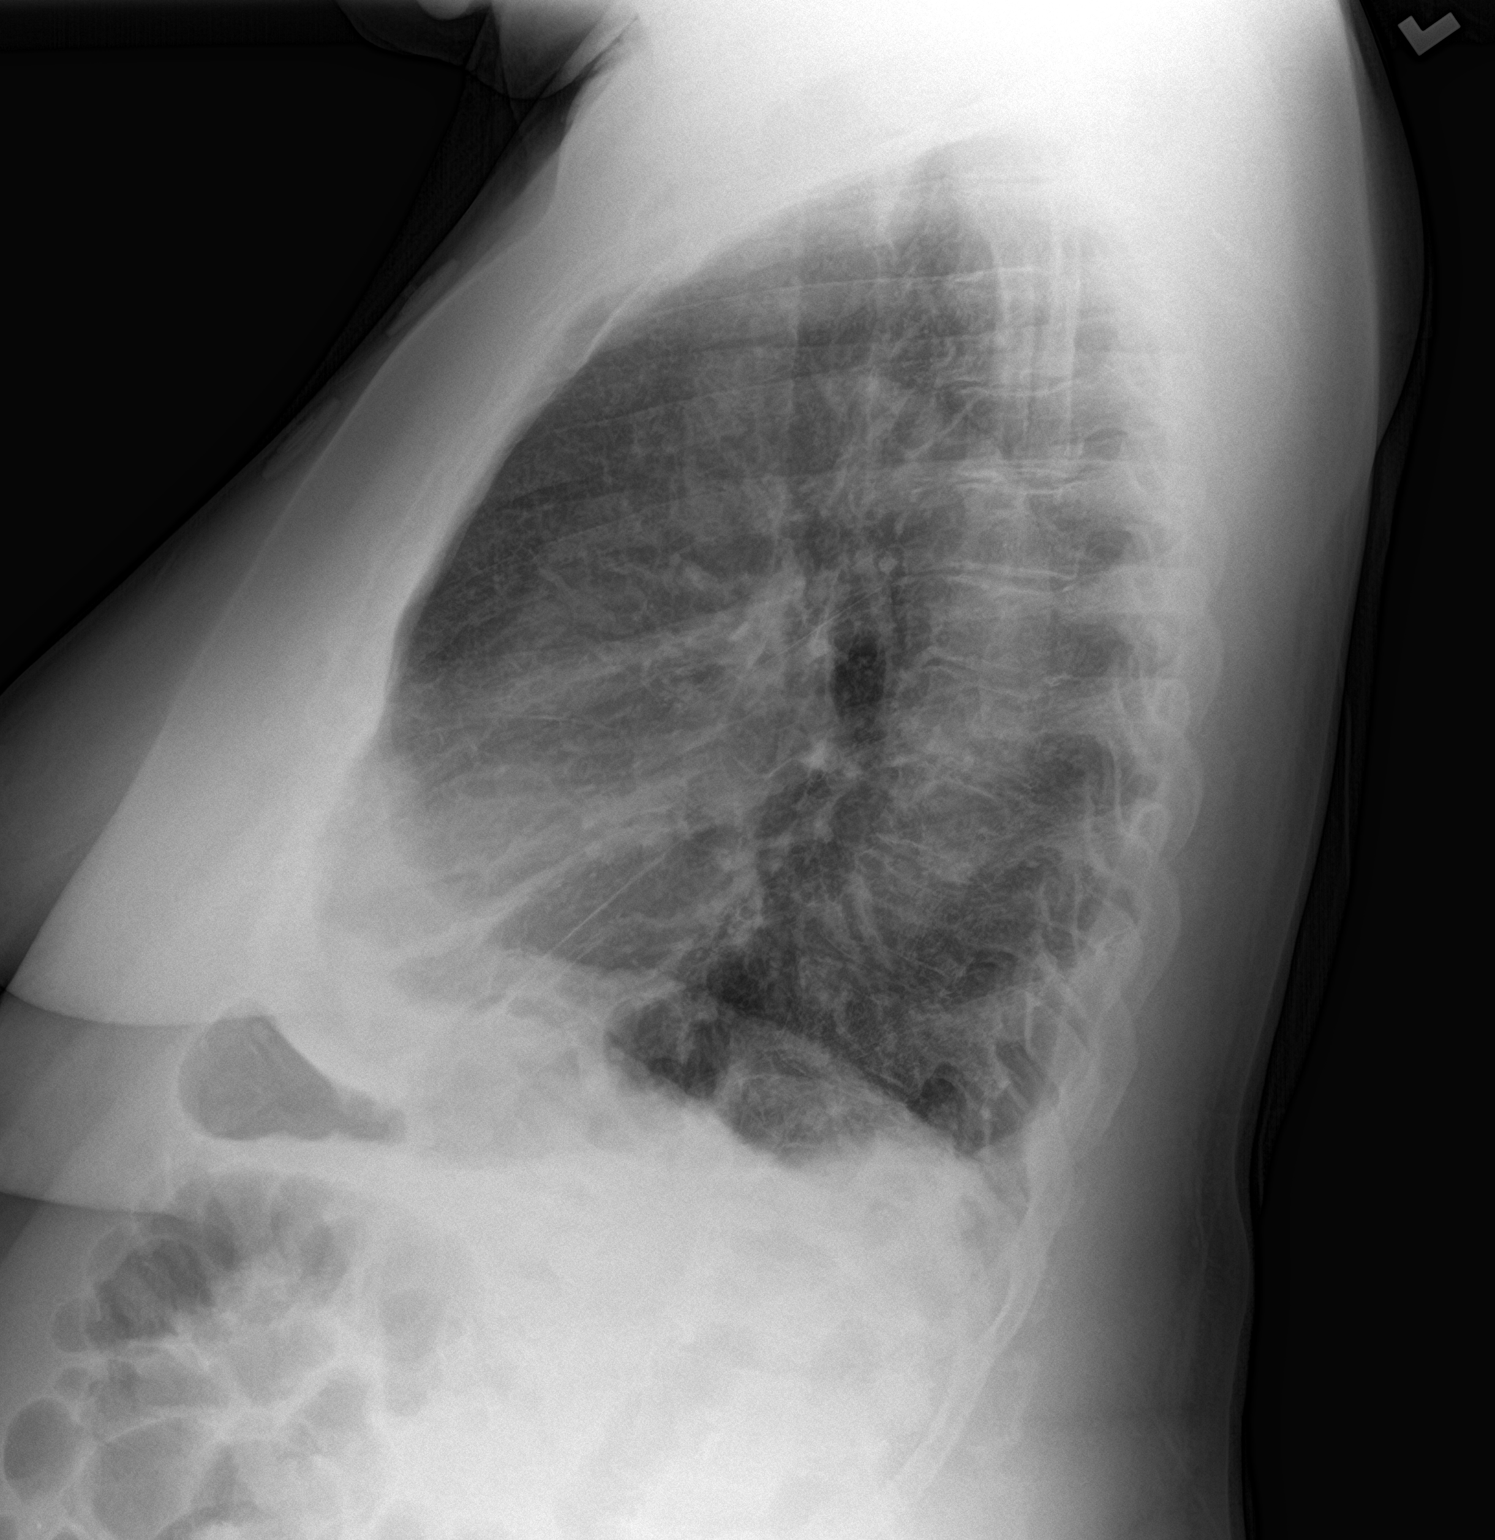

[2 of 2 positions shown; findings below may reference images not displayed]

FINDINGS: The lungs are well-aerated. A small right pleural effusion is noted.
Mild bibasilar opacities could reflect minimal interstitial edema.
No pneumothorax is seen.

The heart is normal in size; the mediastinal contour is within
normal limits. No acute osseous abnormalities are seen.
IMPRESSION: Small right pleural effusion. Mild bibasilar opacities could reflect
minimal interstitial edema.

## 2018-10-30 IMAGING — CT CT CHEST W/O CM
2 of 4 series · 14 of 36 positions shown, 17 images · non-contrast
Comparison: Chest x-ray 08/07/2016

CLINICAL DATA: Swelling in the bilateral lower extremities with
chest congestion

EXAM:
CT CHEST, ABDOMEN AND PELVIS WITHOUT CONTRAST
TECHNIQUE: Multidetector CT imaging of the chest, abdomen and pelvis was
performed following the standard protocol without IV contrast.

[Series 2: axial st · axial · 0.98mm/px · z∈[-572,-62]mm · 11 of 124 slices shown, 14 images]
[im 11/124  mediastinal]
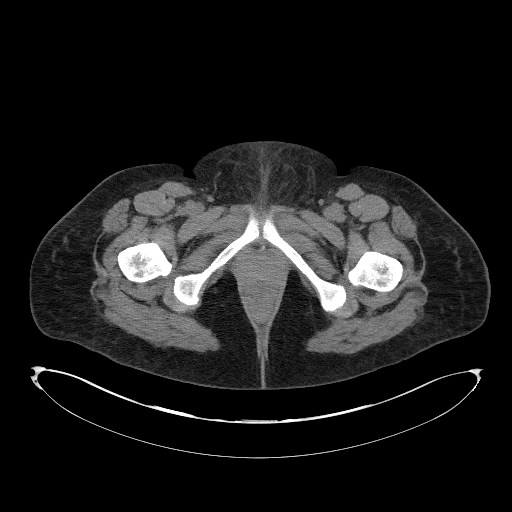
[im 11/124  lung]
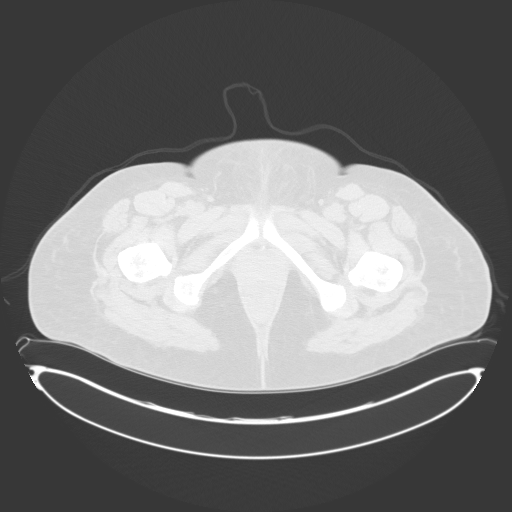
[im 21/124  lung]
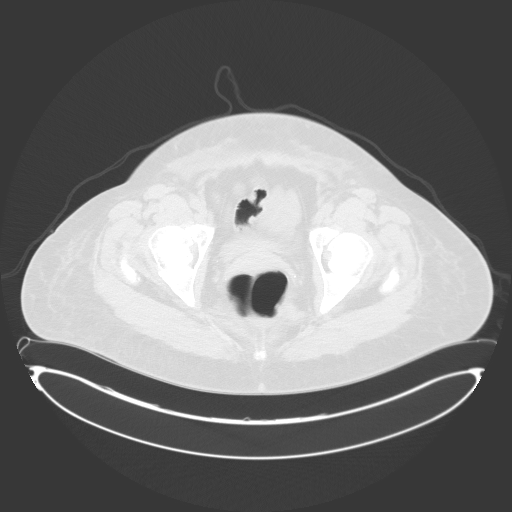
[im 31/124  lung]
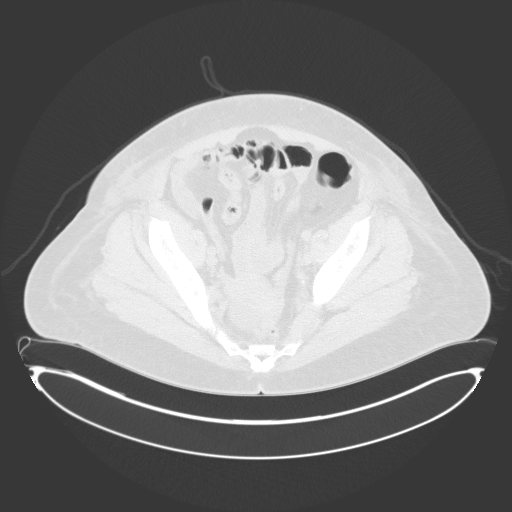
[im 42/124  lung]
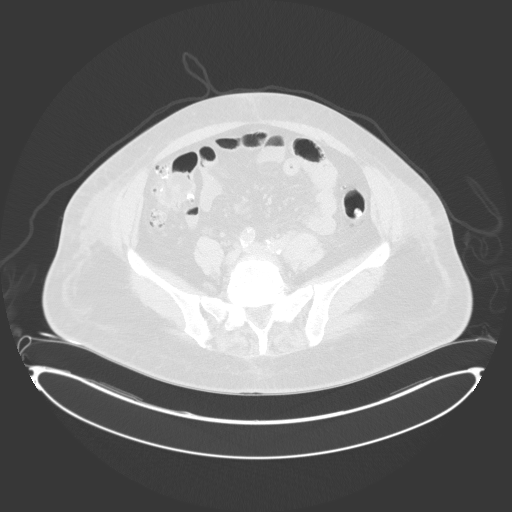
[im 52/124  mediastinal]
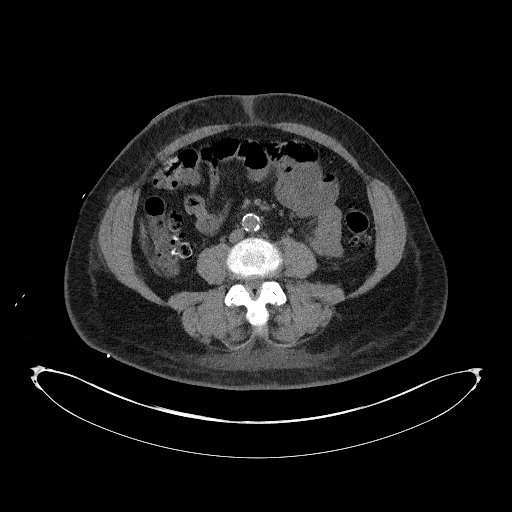
[im 52/124  lung]
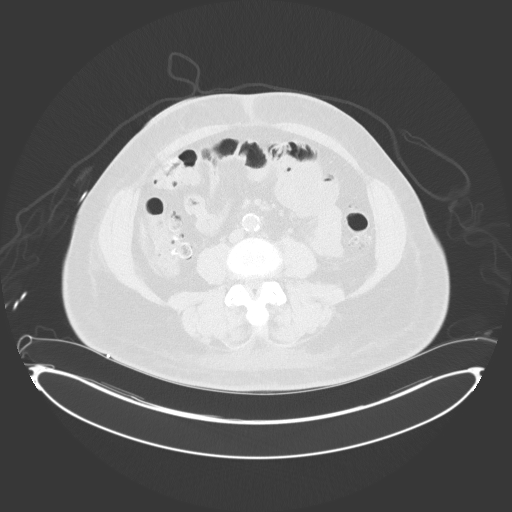
[im 62/124  lung]
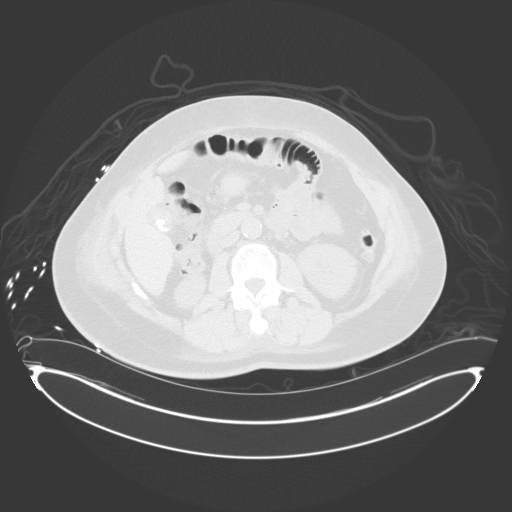
[im 72/124  lung]
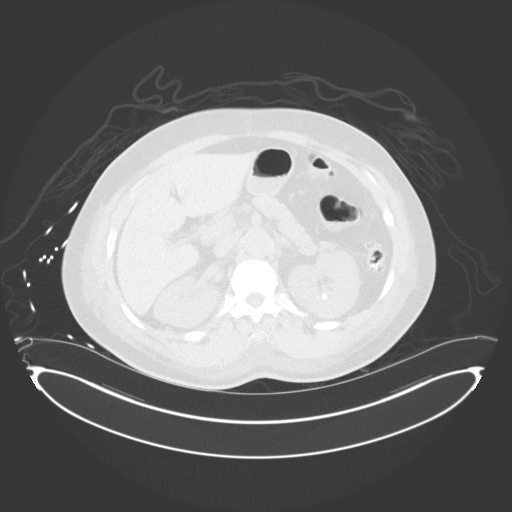
[im 83/124  lung]
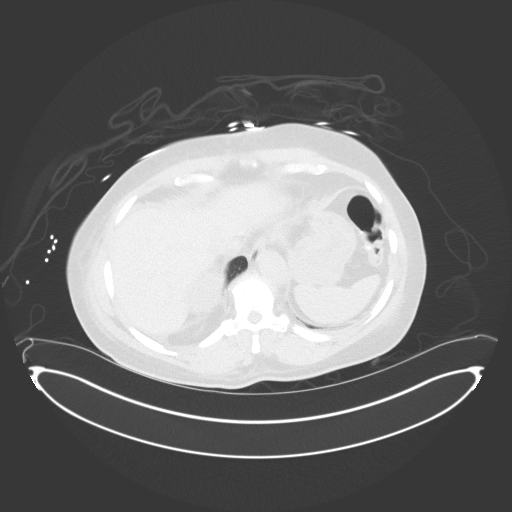
[im 93/124  mediastinal]
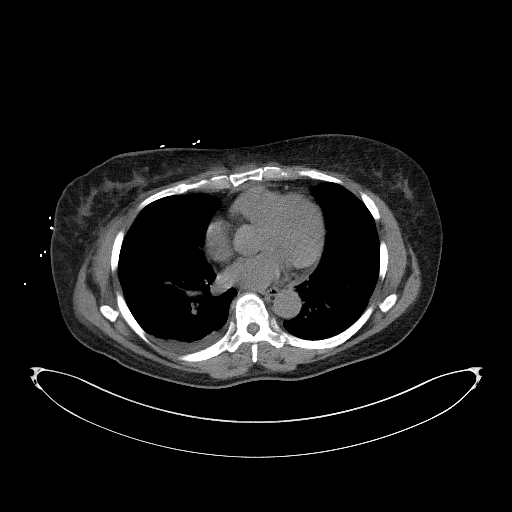
[im 93/124  lung]
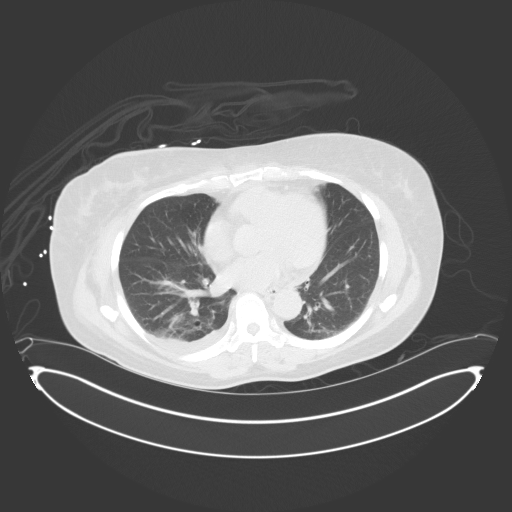
[im 103/124  lung]
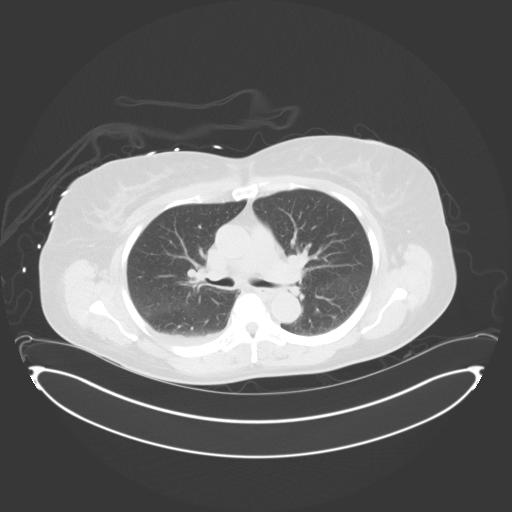
[im 113/124  lung]
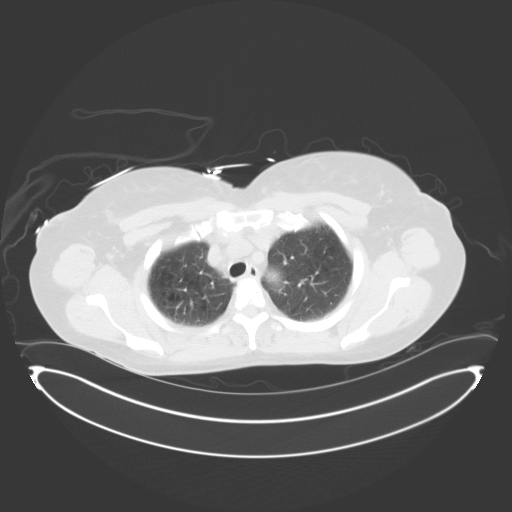

[Series 5: coronal · coronal · 0.89mm/px · 3 of 133 slices shown]
[im 27/133  lung]
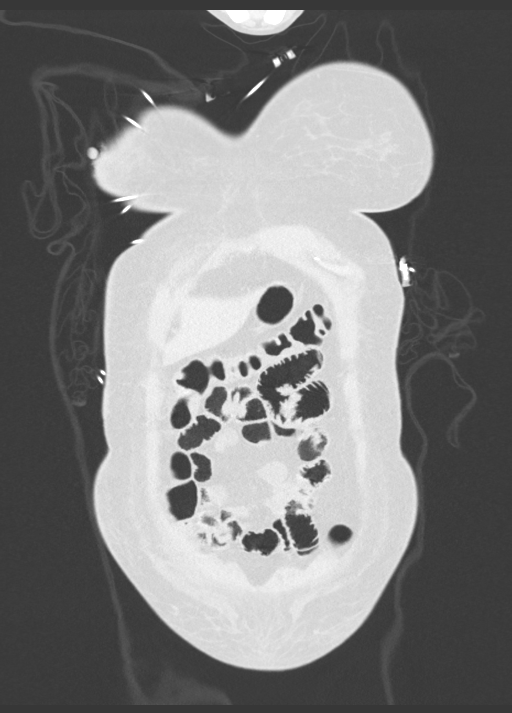
[im 53/133  lung]
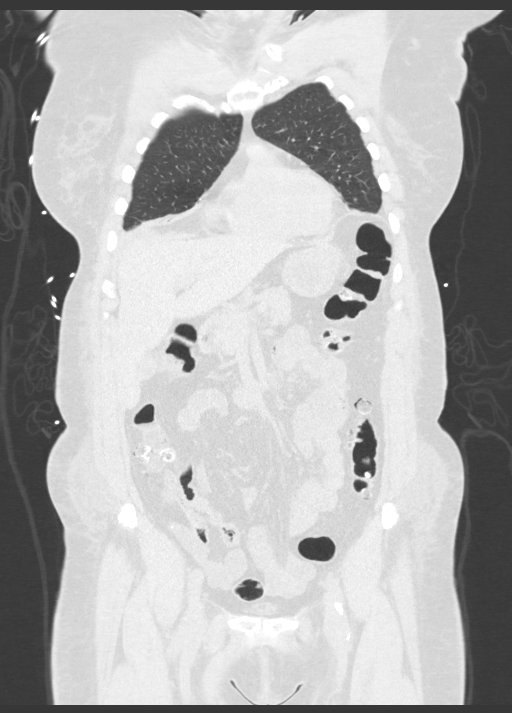
[im 80/133  lung]
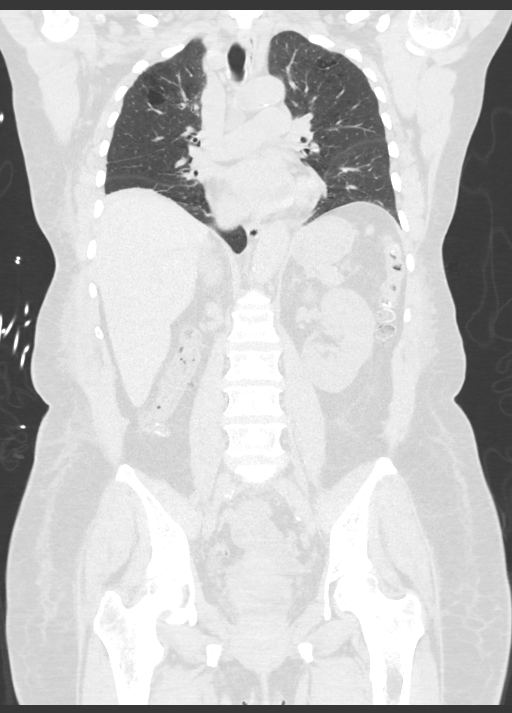

[14 of 36 positions shown; findings below may reference images not displayed]

FINDINGS: CT CHEST FINDINGS

Cardiovascular: Limited evaluation without intravenous contrast.
Atherosclerotic calcifications. Non aneurysmal aorta. Coronary
artery calcifications. The heart is nonenlarged. Trace pericardial
effusion.

Mediastinum/Nodes: Midline trachea. Thyroid grossly normal. The
esophagus is within normal limits.

Lungs/Pleura: Mild to moderate emphysema. Small right-sided pleural
effusion with suspected infiltrate in the right lower lobe. No
pneumothorax.

Musculoskeletal: No acute or suspicious bone lesion.

CT ABDOMEN PELVIS FINDINGS

Hepatobiliary: No focal hepatic abnormality. Calcified gallstones.
No biliary dilatation

Pancreas: Unremarkable. No pancreatic ductal dilatation or
surrounding inflammatory changes.

Spleen: Normal in size without focal abnormality.

Adrenals/Urinary Tract: Adrenal glands are within normal limits. No
hydronephrosis. Two adjacent stones in the mid left kidney, both
measuring 6 mm. The bladder is unremarkable.

Stomach/Bowel: Stomach is within normal limits. Appendix appears
normal. No evidence of bowel wall thickening, distention, or
inflammatory changes. Numerous colon diverticula without acute
inflammation.

Vascular/Lymphatic: Aortic atherosclerosis. No enlarged abdominal or
pelvic lymph nodes.

Reproductive: Uterus and bilateral adnexa are unremarkable.

Other: Small amount of free fluid within the pelvis.

Musculoskeletal: No acute or suspicious bone lesion. Degenerative
changes. Mild diffuse subcutaneous edema.
IMPRESSION: 1. Small right-sided pleural effusion with suspected right lower
lobe pneumonia.
2. Gallstones
3. Nonobstructing stones in the left kidney
4. Colon diverticular disease without acute inflammation
5. Small amount of free fluid in the pelvis. Mild diffuse
subcutaneous edema.

## 2018-11-01 NOTE — Progress Notes (Signed)
[facility]   [phone] Fax:(336) [phone]   Clinic Follow up Note   Patient Care Team: Glendon Axe, MD as PCP - General (Family Medicine) [name], MD as PCP - Cardiology (Cardiology) Dwana Melena, MD as Attending Physician (Nephrology) Truitt Merle, MD as Consulting Physician (Hematology)  Date of Service:  11/04/2018  CHIEF COMPLAINT: F/u on intrahepatic cholangiocarcinoma  SUMMARY OF ONCOLOGIC HISTORY: Oncology History  Intrahepatic cholangiocarcinoma ([place])  02/23/2018 Imaging   CT AP W Contrast 02/23/18  IMPRESSION: 1. Large multi-cystic lesion in the central aspect of the liver adjacent to the gallbladder fossa, with several smaller satellite lesions. These are all new compared to prior CT the chest, abdomen and pelvis 08/07/2016, concerning for intrahepatic abscesses. 2. Extensive mural thickening and inflammatory changes in the region of the cecum. This is nonspecific, and could reflect either right-sided diverticulitis, focal area of colitis, or potentially even underlying neoplasm. 3. Cholelithiasis. Gallbladder is nearly completely contracted without surrounding inflammatory changes to suggest an acute cholecystitis at this time. 4. Left-sided nephrolithiasis measuring up to 8 mm in the upper pole collecting system of left kidney. No ureteral stones or findings of urinary tract obstruction. 5. Aortic atherosclerosis.   02/27/2018 Initial Biopsy   Diagnosis 02/27/18  Liver, needle/core biopsy, Right Hepatic Lobe - ADENOCARCINOMA. SEE NOTE.   02/27/2018 Miscellaneous     03/05/2018 Procedure   Colonoscopy 03/05/18  IMPRESSION - Preparation of the colon was fair. - Non-thrombosed external hemorrhoids found on digital rectal exam. - There was significant looping of the colon. - Severe diverticulosis in the recto-sigmoid colon, in the sigmoid colon, in the descending colon, in the transverse colon, at the hepatic flexure, in  the ascending colon and in the cecum. There was no evidence of diverticular bleeding. - A single (solitary) ulcer in the cecum - highly concerning for underlying malignancy. Biopsied. Phlegmonous change is possible, though often in setting of complicated diverticulosis/diverticulitis ulceration would not be as common. - Erythematous mucosa in the transverse colon, at the hepatic flexure, in the ascending colon and in the cecum. Biopsied. - Normal mucosa in the rectum, in the recto-sigmoid colon, in the sigmoid colon and in the descending colon. Biopsied. - Non-bleeding non-thrombosed external and internal hemorrhoids. -----Negative for malignancy    03/11/2018 Initial Diagnosis   Intrahepatic cholangiocarcinoma ([place])   04/02/2018 Imaging   CT CAP W contrast 04/02/18  IMPRESSION: 1. 4 mm subpleural nodule in the periphery of the right lower lobe. This is nonspecific, but strongly favored to represent a benign subpleural lymph node. Attention on follow-up studies is recommended to exclude the possibility of metastatic disease. 2. Diffuse bronchial wall thickening with moderate centrilobular and mild paraseptal emphysema; imaging findings suggestive of underlying COPD. 3. Aortic atherosclerosis, in addition to left main and left anterior descending coronary artery disease. Assessment for potential risk factor modification, dietary therapy or pharmacologic therapy may be warranted, if clinically indicated. 4. Nonobstructive calculi in the upper pole collecting system of left kidney measuring up to 7 mm.    04/27/2018 Cancer Staging   Staging form: Intrahepatic Bile Duct, AJCC 8th Edition - Clinical: Stage IIIB (cT2, cN1, cM0) - Signed by Truitt Merle, MD on 04/27/2018   05/18/2018 -  Chemotherapy   Gemcitabine and Cisplatin every 2 weeksstarting on 05/18/2018, with 50% dose reduction   07/19/2018 Imaging   CT CAP W CONTRAST  IMPRESSION: 1. Dominant inferior liver mass is decreased  in size. Two subcentimeter clustered low-attenuation lesions in the far  inferior right liver lobe, not discretely visualized on the prior noncontrast CT study, can not exclude new small satellite hepatic metastases. 2. Porta hepatis adenopathy is mildly decreased. 3. Small right pulmonary nodules are stable. 4. No additional potential new or progressive metastatic disease. 5. Small pericardial effusion, slightly increased. 6. Cholelithiasis. 7. Moderate diffuse colonic diverticulosis. 8. Aortic Atherosclerosis (ICD10-I70.0) and Emphysema (ICD10-J43.9).    11/02/2018 Imaging   CT CAP WO contrast  IMPRESSION: 1. The dominant right hepatic lobe mass and most of the smaller right hepatic lobe lesions are stable. One of the right hepatic lobe lesions appears to have increased in size, formerly about 1.1 by 1.2 cm and currently 1.9 by 1.5 cm. 2. Two small right lower lobe pulmonary nodules are stable in size. 3. A hypodense right thyroid nodule has enlarged, currently 1.9 by 1.2 cm and formerly 1.1 by 0.7 cm. Consider thyroid ultrasound for further evaluation. 4. Other imaging findings of potential clinical significance: Aortic Atherosclerosis (ICD10-I70.0) and Emphysema (ICD10-J43.9). Coronary atherosclerosis. Mild cardiomegaly. Low-density blood pool suggests anemia. Nonobstructive left nephrolithiasis. Colonic diverticulosis.      CURRENT THERAPY:  Gemcitabine and Cisplatin every 2 weeksstartingon 05/18/2018, with dose reduction  INTERVAL HISTORY:  Mary Chapman is here for a follow up and treatment. She presents to the clinic alone. She notes neuropathy in b/l legs and feet. She denies it in her hands. This occurs moderately all the time but mainly at night. She is able to still sleep. She notes she rarely needs pain medication for her abdominal pain. She is able to eat adequately.     REVIEW OF SYSTEMS:   Constitutional: Denies fevers, chills or abnormal weight loss Eyes:  Denies blurriness of vision Ears, nose, mouth, throat, and face: Denies mucositis or sore throat Respiratory: Denies cough, dyspnea or wheezes Cardiovascular: Denies palpitation, chest discomfort or lower extremity swelling Gastrointestinal:  Denies nausea, heartburn or change in bowel habits Skin: Denies abnormal skin rashes Lymphatics: Denies new lymphadenopathy or easy bruising Neurological (+) neuropathy  Behavioral/Psych: Mood is stable, no new changes  All other systems were reviewed with the patient and are negative.  MEDICAL HISTORY:  Past Medical History:  Diagnosis Date  . Diverticulitis   . Focal segmental glomerulosclerosis    ESRD on MWF HD  . Hypertension   . intrahepatic cholangio ca dx'd10/2019  . Renal insufficiency    dialysis pt MWF  . Tobacco abuse     SURGICAL HISTORY: Past Surgical History:  Procedure Laterality Date  . AV FISTULA PLACEMENT Left 03/18/2017   Procedure: Brachiocephalic ARTERIOVENOUS (AV) FISTULA CREATION left arm;  Surgeon: Elam Dutch, MD;  Location: [facility];  Service: Vascular;  Laterality: Left;  . BIOPSY  03/05/2018   Procedure: BIOPSY;  Surgeon: Rush Landmark Telford Nab., MD;  Location: [facility];  Service: Gastroenterology;;  enteroscopy bx /cytology brushing Greig Castilla bx  . COLONOSCOPY WITH PROPOFOL N/A 03/05/2018   Procedure: COLONOSCOPY WITH PROPOFOL;  Surgeon: Rush Landmark Telford Nab., MD;  Location: [facility];  Service: Gastroenterology;  Laterality: N/A;  Bx, Spot  . ENTEROSCOPY N/A 03/05/2018   Procedure: ENTEROSCOPY;  Surgeon: Rush Landmark Telford Nab., MD;  Location: [facility];  Service: Gastroenterology;  Laterality: N/A;  BX, Brushing, & Spot  . INSERTION OF DIALYSIS CATHETER N/A 03/18/2017   Procedure: INSERTION OF DIALYSIS CATHETER;  Surgeon: Elam Dutch, MD;  Location: [facility];  Service: Vascular;  Laterality: N/A;  . IR GENERIC HISTORICAL  08/15/2016   IR US GUIDE VASC ACCESS RIGHT 08/15/2016 Arne Cleveland,  MD  [facility]  . IR GENERIC HISTORICAL  08/15/2016   IR FLUORO GUIDE CV LINE RIGHT 08/15/2016 Arne Cleveland, MD [facility]  . IR IMAGING GUIDED PORT INSERTION  05/06/2018  . SUBMUCOSAL INJECTION  03/05/2018   Procedure: SUBMUCOSAL INJECTION;  Surgeon: Rush Landmark Telford Nab., MD;  Location: [facility];  Service: Gastroenterology;;  spot tatoo  . TUBAL LIGATION      I have reviewed the social history and family history with the patient and they are unchanged from previous note.  ALLERGIES:  has No Known Allergies.  MEDICATIONS:  Current Outpatient Medications  Medication Sig Dispense Refill  . amLODipine (NORVASC) 10 MG tablet     . folic acid (FOLVITE) 1 MG tablet Take 1 tablet (1 mg total) by mouth daily. 30 tablet 0  . lidocaine-prilocaine (EMLA) cream Apply to affected area once 30 g 3  . metoprolol tartrate (LOPRESSOR) 25 MG tablet Take 0.5 tablets (12.5 mg total) by mouth 2 (two) times daily. 30 tablet 0  . multivitamin (RENA-VIT) TABS tablet Take 1 tablet by mouth at bedtime. 30 tablet 3  . nicotine (NICODERM CQ - DOSED IN MG/24 HOURS) 21 mg/24hr patch Place 1 patch (21 mg total) onto the skin daily. 28 patch 0  . Nutritional Supplements (FEEDING SUPPLEMENT, NEPRO CARB STEADY,) LIQD Take 237 mLs by mouth 3 (three) times daily as needed (Supplement). 90 Can 0  . ondansetron (ZOFRAN) 8 MG tablet Take 1 tablet (8 mg total) by mouth 2 (two) times daily as needed. Start on the third day after chemotherapy. 30 tablet 1  . oxyCODONE (OXY IR/ROXICODONE) 5 MG immediate release tablet Take 1 tablet (5 mg total) by mouth every 6 (six) hours as needed for severe pain. 30 tablet 0  . pantoprazole (PROTONIX) 40 MG tablet TAKE 1 TABLET BY MOUTH TWICE A DAY 60 tablet 1  . polyethylene glycol (MIRALAX) packet Take 17 g by mouth daily. 30 each 0  . prochlorperazine (COMPAZINE) 10 MG tablet Take 1 tablet (10 mg total) by mouth every 6 (six) hours as needed (Nausea or vomiting). 30 tablet 1  .  senna-docusate (SENOKOT-S) 8.6-50 MG tablet Take 2 tablets by mouth 2 (two) times daily. 30 tablet 0  . Vitamin D, Ergocalciferol, (DRISDOL) 50000 units CAPS capsule Take 50,000 Units by mouth once a week.  5  . gabapentin (NEURONTIN) 100 MG capsule Take 1 capsule (100 mg total) by mouth at bedtime. 60 capsule 1   No current facility-administered medications for this visit.    Facility-Administered Medications Ordered in Other Visits  Medication Dose Route Frequency Provider Last Rate Last Dose  . sodium chloride flush (NS) 0.9 % injection 10 mL  10 mL Intracatheter PRN Truitt Merle, MD   10 mL at 07/27/18 1745    PHYSICAL EXAMINATION: ECOG PERFORMANCE STATUS: 2 - Symptomatic, <50% confined to bed  Vitals:   11/04/18 0850  BP: (!) 145/85  Pulse: 81  Resp: 18  Temp: (!) 97.3 F (36.3 C)  SpO2: 100%   Filed Weights   11/04/18 0850  Weight: 140 lb (63.5 kg)    GENERAL:alert, no distress and comfortable SKIN: skin color, texture, turgor are normal, no rashes or significant lesions EYES: normal, Conjunctiva are pink and non-injected, sclera clear  NECK: supple, thyroid normal size, non-tender, without nodularity LYMPH:  no palpable lymphadenopathy in the cervical, axillary  LUNGS: clear to auscultation and percussion with normal breathing effort HEART: regular rate & rhythm and no murmurs and no lower extremity  edema ABDOMEN:abdomen soft, non-tender and normal bowel sounds Musculoskeletal:no cyanosis of digits and no clubbing  NEURO: alert & oriented x 3 with fluent speech, no focal motor/sensory deficits  LABORATORY DATA:  I have reviewed the data as listed CBC Latest Ref Rng & Units 11/04/2018 10/19/2018 10/05/2018  WBC 4.0 - 10.5 K/uL 6.1 6.8 8.3  Hemoglobin 12.0 - 15.0 g/dL 7.8(L) 7.6(L) 8.2(L)  Hematocrit 36.0 - 46.0 % 24.5(L) 24.1(L) 25.8(L)  Platelets 150 - 400 K/uL 233 168 178     CMP Latest Ref Rng & Units 11/04/2018 10/19/2018 10/05/2018  Glucose 70 - 99 mg/dL 90 92 93   BUN 8 - 23 mg/dL 40(H) 44(H) 34(H)  Creatinine 0.44 - 1.00 mg/dL 6.19(HH) 6.82(HH) 6.61(HH)  Sodium 135 - 145 mmol/L 139 138 142  Potassium 3.5 - 5.1 mmol/L 4.3 4.9 4.0  Chloride 98 - 111 mmol/L 99 100 103  CO2 22 - 32 mmol/L _0 Calcium 8.9 - 10.3 mg/dL 8.4(L) 7.9(L) 7.7(L)  Total Protein 6.5 - 8.1 g/dL 7.0 6.6 6.9  Total Bilirubin 0.3 - 1.2 mg/dL 0.4 0.4 0.3  Alkaline Phos 38 - 126 U/L 134(H) 131(H) 144(H)  AST 15 - 41 U/L 14(L) 13(L) 13(L)  ALT 0 - 44 U/L _1 RADIOGRAPHIC STUDIES: I have personally reviewed the radiological images as listed and agreed with the findings in the report. No results found.   ASSESSMENT & PLAN:  Mary Chapman is a 73 y.o. female with   1. Adenocarcinomainliver, likelyintrahepatic cholangiocarcinoma, cT2N1M0 stage IIIB, unresectable, MS-Stable -Diagnosed in 02/2018. She is not eligible for surgery due to tumor size and location.She was [facility] by [facility] Cl[facility] -Currently onfirst lineCisplatin and Gemcitabineevery 2 weekssince 12/2019with dose reduction. -She is tolerating chemotherapy moderately will, with mild fatigue, moderate anemia that required blood transfusion but noothersevere otherside affects -Her abdominal painhasresolved lately, indicating good response to chemotherapy. Her restaging CT on 07/19/2018 showed partial response  -WI personally reviewed and discussed her restaging CT CAP images from 11/02/18 which shows stable disease in liver, mild increase in Thyroid nodule . She has some calcification at tumor site. I discussed her current regimen will lose efficacy eventually but given the stable disease and she is tolerating well we will continue until next scan in 2-3 months.  -I discussed once she has disease progression we can consider FOFIRI. I do not think she can tolerate oxaliplatin given her neuropathy.  -I reviewed her FO results which show BRCA1 mutation. I recommend a genetic testing panel to determine if she  was born with this mutation or not, which can have implications for her children. She is agreeable. This mutation does make her eligible for target therapy as next line treatment.  -Labs reviewed, CBC and CMP WNL except hg 7.8, Cr 6.19. Overall adequate to proceed with Cisplatin/Gemcitabine today with reduced chemo dose due to neuropathy -F/u with NP [name] in 2 weeks   2. HTN  -Currently on metoprolol. -BP at 145/85 today (11/04/18), f/u with nephology and PCP  3. End Stage Renal Disease, on HD MWF -Treated with dialysis  -f/u with nephrologist Dr. Augustin Coupe -she is tolerating HD well   4. Anemia of chronic diseaseand chemo induced anemia -Due to CKD and malignancy -She received blood transfusion after first cycle chemoand will consider blood transfusion when Hg <8.Due to the outbreak ofCOVID 19, blood supply has been low lately, will holdblood transfusion until hemoglobin less than 7 -We discussed the role  ofEPOfor anemia, if she requires frequent blood transfusion.She voiced good understanding,and agrees withEPO if she really needs it. -I previously recommended her to take multivitamins -Hg at 7.8 today (11/04/18).  5.Goal of care discussion  -The patient understands the goal of care is palliative, to prolong his life -she is full code now  6. Neuropathy  -She developed neuropathy of legs and feet, mainly at night  -I will prescribe Gabapentin today. Given her ESRD she can take up to 240m at night and if needed 1 tab during the day.  -Likely secondary to Cisplatin, will reduce dose   Plan -I prescribed her gabapentin today  -CT scan reviewed, overall stable disease  -lab reviewed and adequate for chemotherapy cisplatin and gemcitabine today with dose reduction  -Lab, flush, f/u with lacie and chemo in 2 weeks  -genetic testing    No problem-specific Assessment & Plan notes found for this encounter.   No orders of the defined types were placed in this  encounter.  All questions were answered. The patient knows to call the clinic with any problems, questions or concerns. No barriers to learning was detected. I spent 20 minutes counseling the patient face to face. The total time spent in the appointment was 25 minutes and more than 50% was on counseling and review of test results     YTruitt Merle MD 11/04/2018   I, AJoslyn Devon am acting as scribe for YTruitt Merle MD.   I have reviewed the above documentation for accuracy and completeness, and I agree with the above.

## 2018-11-02 ENCOUNTER — Other Ambulatory Visit: Payer: Self-pay

## 2018-11-02 ENCOUNTER — Ambulatory Visit (HOSPITAL_COMMUNITY)
Admission: RE | Admit: 2018-11-02 | Discharge: 2018-11-02 | Disposition: A | Discharge status: Home / Self Care | Payer: Medicare Other | Source: Ambulatory Visit | Attending: Nurse Practitioner | Admitting: Nurse Practitioner

## 2018-11-02 ENCOUNTER — Other Ambulatory Visit: Payer: Self-pay | Admitting: Hematology

## 2018-11-02 DIAGNOSIS — C221 Intrahepatic bile duct carcinoma: Secondary | ICD-10-CM

## 2018-11-02 DIAGNOSIS — D490 Neoplasm of unspecified behavior of digestive system: Secondary | ICD-10-CM | POA: Insufficient documentation

## 2018-11-03 ENCOUNTER — Ambulatory Visit: Payer: Medicare Other

## 2018-11-03 ENCOUNTER — Other Ambulatory Visit: Payer: Medicare Other

## 2018-11-03 ENCOUNTER — Ambulatory Visit: Payer: Medicare Other | Admitting: Hematology

## 2018-11-04 ENCOUNTER — Inpatient Hospital Stay (HOSPITAL_BASED_OUTPATIENT_CLINIC_OR_DEPARTMENT_OTHER): Payer: Medicare Other | Admitting: Hematology

## 2018-11-04 ENCOUNTER — Inpatient Hospital Stay: Payer: Medicare Other

## 2018-11-04 ENCOUNTER — Telehealth: Payer: Self-pay | Admitting: *Deleted

## 2018-11-04 ENCOUNTER — Other Ambulatory Visit: Payer: Self-pay

## 2018-11-04 VITALS — BP 145/85 | HR 81 | Temp 97.3°F | Resp 18 | Ht 66.0 in | Wt 140.0 lb

## 2018-11-04 DIAGNOSIS — Z5112 Encounter for antineoplastic immunotherapy: Secondary | ICD-10-CM | POA: Diagnosis not present

## 2018-11-04 DIAGNOSIS — N186 End stage renal disease: Secondary | ICD-10-CM

## 2018-11-04 DIAGNOSIS — D6481 Anemia due to antineoplastic chemotherapy: Secondary | ICD-10-CM

## 2018-11-04 DIAGNOSIS — C221 Intrahepatic bile duct carcinoma: Secondary | ICD-10-CM

## 2018-11-04 DIAGNOSIS — I10 Essential (primary) hypertension: Secondary | ICD-10-CM | POA: Diagnosis not present

## 2018-11-04 DIAGNOSIS — D490 Neoplasm of unspecified behavior of digestive system: Secondary | ICD-10-CM

## 2018-11-04 DIAGNOSIS — K6389 Other specified diseases of intestine: Secondary | ICD-10-CM

## 2018-11-04 DIAGNOSIS — Z992 Dependence on renal dialysis: Secondary | ICD-10-CM

## 2018-11-04 DIAGNOSIS — Z95828 Presence of other vascular implants and grafts: Secondary | ICD-10-CM

## 2018-11-04 DIAGNOSIS — Z79899 Other long term (current) drug therapy: Secondary | ICD-10-CM

## 2018-11-04 DIAGNOSIS — Z87891 Personal history of nicotine dependence: Secondary | ICD-10-CM

## 2018-11-04 LAB — CBC WITH DIFFERENTIAL (CANCER CENTER ONLY)
Abs Immature Granulocytes: 0.02 10*3/uL (ref 0.00–0.07)
Basophils Absolute: 0 10*3/uL (ref 0.0–0.1)
Basophils Relative: 0 %
Eosinophils Absolute: 0.9 10*3/uL — ABNORMAL HIGH (ref 0.0–0.5)
Eosinophils Relative: 15 %
HCT: 24.5 % — ABNORMAL LOW (ref 36.0–46.0)
Hemoglobin: 7.8 g/dL — ABNORMAL LOW (ref 12.0–15.0)
Immature Granulocytes: 0 %
Lymphocytes Relative: 18 %
Lymphs Abs: 1.1 10*3/uL (ref 0.7–4.0)
MCH: 34.4 pg — ABNORMAL HIGH (ref 26.0–34.0)
MCHC: 31.8 g/dL (ref 30.0–36.0)
MCV: 107.9 fL — ABNORMAL HIGH (ref 80.0–100.0)
Monocytes Absolute: 0.6 10*3/uL (ref 0.1–1.0)
Monocytes Relative: 11 %
Neutro Abs: 3.5 10*3/uL (ref 1.7–7.7)
Neutrophils Relative %: 56 %
Platelet Count: 233 10*3/uL (ref 150–400)
RBC: 2.27 MIL/uL — ABNORMAL LOW (ref 3.87–5.11)
RDW: 14.5 % (ref 11.5–15.5)
WBC Count: 6.1 10*3/uL (ref 4.0–10.5)
nRBC: 0 % (ref 0.0–0.2)

## 2018-11-04 LAB — CMP (CANCER CENTER ONLY)
ALT: 8 U/L (ref 0–44)
AST: 14 U/L — ABNORMAL LOW (ref 15–41)
Albumin: 3.2 g/dL — ABNORMAL LOW (ref 3.5–5.0)
Alkaline Phosphatase: 134 U/L — ABNORMAL HIGH (ref 38–126)
Anion gap: 14 (ref 5–15)
BUN: 40 mg/dL — ABNORMAL HIGH (ref 8–23)
CO2: 26 mmol/L (ref 22–32)
Calcium: 8.4 mg/dL — ABNORMAL LOW (ref 8.9–10.3)
Chloride: 99 mmol/L (ref 98–111)
Creatinine: 6.19 mg/dL (ref 0.44–1.00)
GFR, Est AFR Am: 7 mL/min — ABNORMAL LOW (ref >60)
GFR, Estimated: 6 mL/min — ABNORMAL LOW (ref >60)
Glucose, Bld: 90 mg/dL (ref 70–99)
Potassium: 4.3 mmol/L (ref 3.5–5.1)
Sodium: 139 mmol/L (ref 135–145)
Total Bilirubin: 0.4 mg/dL (ref 0.3–1.2)
Total Protein: 7 g/dL (ref 6.5–8.1)

## 2018-11-04 MED ORDER — SODIUM CHLORIDE 0.9 % IV SOLN
10.0000 mg/m2 | Freq: Once | INTRAVENOUS | Status: AC
  Administered 2018-11-04: 17 mg via INTRAVENOUS
  Filled 2018-11-04: qty 17

## 2018-11-04 MED ORDER — SODIUM CHLORIDE 0.9 % IV SOLN
Freq: Once | INTRAVENOUS | Status: AC
  Administered 2018-11-04: 10:00:00 via INTRAVENOUS
  Filled 2018-11-04: qty 5

## 2018-11-04 MED ORDER — SODIUM CHLORIDE 0.9% FLUSH
10.0000 mL | INTRAVENOUS | Status: DC | PRN
  Administered 2018-11-04: 10 mL
  Filled 2018-11-04: qty 10

## 2018-11-04 MED ORDER — PALONOSETRON HCL INJECTION 0.25 MG/5ML
0.2500 mg | Freq: Once | INTRAVENOUS | Status: AC
  Administered 2018-11-04: 0.25 mg via INTRAVENOUS

## 2018-11-04 MED ORDER — HEPARIN SOD (PORK) LOCK FLUSH 100 UNIT/ML IV SOLN
500.0000 [IU] | Freq: Once | INTRAVENOUS | Status: AC | PRN
  Administered 2018-11-04: 500 [IU]
  Filled 2018-11-04: qty 5

## 2018-11-04 MED ORDER — SODIUM CHLORIDE 0.9 % IV SOLN
Freq: Once | INTRAVENOUS | Status: AC
  Administered 2018-11-04: 10:00:00 via INTRAVENOUS
  Filled 2018-11-04: qty 250

## 2018-11-04 MED ORDER — PALONOSETRON HCL INJECTION 0.25 MG/5ML
INTRAVENOUS | Status: AC
  Filled 2018-11-04: qty 5

## 2018-11-04 MED ORDER — SODIUM CHLORIDE 0.9 % IV SOLN
600.0000 mg/m2 | Freq: Once | INTRAVENOUS | Status: AC
  Administered 2018-11-04: 12:00:00 1026 mg via INTRAVENOUS
  Filled 2018-11-04: qty 26.98

## 2018-11-04 MED ORDER — GABAPENTIN 100 MG PO CAPS: 100.0000 mg | ORAL_CAPSULE | Freq: Every day | ORAL | 1 refills | Status: DC

## 2018-11-04 NOTE — Patient Instructions (Signed)

## 2018-11-04 NOTE — Patient Instructions (Signed)
[facility] Discharge Instructions for Patients Receiving Chemotherapy  Today you received the following chemotherapy agents: Gemcitabine (Gemzar) and Cisplatin (Platinol)  To help prevent nausea and vomiting after your treatment, we encourage you to take your nausea medication as directed.   If you develop nausea and vomiting that is not controlled by your nausea medication, call the clinic.   BELOW ARE SYMPTOMS THAT SHOULD BE REPORTED IMMEDIATELY:  *FEVER GREATER THAN 100.5 F  *CHILLS WITH OR WITHOUT FEVER  NAUSEA AND VOMITING THAT IS NOT CONTROLLED WITH YOUR NAUSEA MEDICATION  *UNUSUAL SHORTNESS OF BREATH  *UNUSUAL BRUISING OR BLEEDING  TENDERNESS IN MOUTH AND THROAT WITH OR WITHOUT PRESENCE OF ULCERS  *URINARY PROBLEMS  *BOWEL PROBLEMS  UNUSUAL RASH Items with * indicate a potential emergency and should be followed up as soon as possible.  Feel free to call the clinic should you have any questions or concerns. The clinic phone number is [phone].  Please show the [facility] at check-in to the Emergency Department and triage nurse.  Coronavirus (COVID-19) Are you at risk?  Are you at risk for the Coronavirus (COVID-19)?  To be considered HIGH RISK for Coronavirus (COVID-19), you have to meet the following criteria:  . Traveled to [place], [place], [place], [place] or [place]; or in the [place] to [place], [place], [place], or [place]; and have fever, cough, and shortness of breath within the last 2 weeks of travel OR . Been in close contact with a person diagnosed with COVID-19 within the last 2 weeks and have fever, cough, and shortness of breath . IF YOU DO NOT MEET THESE CRITERIA, YOU ARE CONSIDERED LOW RISK FOR COVID-19.  What to do if you are HIGH RISK for COVID-19?  Marland Kitchen If you are having a medical emergency, call 911. . Seek medical care right away. Before you go to a doctor's office, urgent care or emergency department, call  ahead and tell them about your recent travel, contact with someone diagnosed with COVID-19, and your symptoms. You should receive instructions from your physician's office regarding next steps of care.  . When you arrive at healthcare provider, tell the healthcare staff immediately you have returned from visiting [place], [place], [place], [place] or [place]; or traveled in the [place] to [place], [place], [place], or [place]; in the last two weeks or you have been in close contact with a person diagnosed with COVID-19 in the last 2 weeks.   . Tell the health care staff about your symptoms: fever, cough and shortness of breath. . After you have been seen by a medical provider, you will be either: o Tested for (COVID-19) and discharged home on quarantine except to seek medical care if symptoms worsen, and asked to  - Stay home and avoid contact with others until you get your results (4-5 days)  - Avoid travel on public transportation if possible (such as bus, train, or airplane) or o Sent to the Emergency Department by EMS for evaluation, COVID-19 testing, and possible admission depending on your condition and test results.  What to do if you are LOW RISK for COVID-19?  Reduce your risk of any infection by using the same precautions used for avoiding the common cold or flu:  Marland Kitchen Wash your hands often with soap and warm water for at least 20 seconds.  If soap and water are not readily available, use an alcohol-based hand sanitizer with at least 60% alcohol.  Marland Kitchen  If coughing or sneezing, cover your mouth and nose by coughing or sneezing into the elbow areas of your shirt or coat, into a tissue or into your sleeve (not your hands). . Avoid shaking hands with others and consider head nods or verbal greetings only. . Avoid touching your eyes, nose, or mouth with unwashed hands.  . Avoid close contact with people who are sick. . Avoid places or events with large numbers of people in one location,  like concerts or sporting events. . Carefully consider travel plans you have or are making. . If you are planning any travel outside or inside the [place], visit the CDC's Travelers' Health webpage for the latest health notices. . If you have some symptoms but not all symptoms, continue to monitor at home and seek medical attention if your symptoms worsen. . If you are having a medical emergency, call 911.   [facility] / e-Visit: [email or web address]         [facility]: [facility]: [phone]                   [facility]: [phone]

## 2018-11-04 NOTE — Progress Notes (Signed)
11/04/18  Confirmed dose of gemcitabine today to be 600 mg/m2.  Only cisplatin lowered to 10 mg/m2 today.  T.O. Dr Lavonda Jumbo, PharmD

## 2018-11-04 NOTE — Telephone Encounter (Signed)
Received call report from [facility].  "Today's Creat = 6.19."  Routing call note with results.  Scheduled provider F/U today.

## 2018-11-05 ENCOUNTER — Telehealth: Payer: Self-pay | Admitting: Hematology

## 2018-11-05 ENCOUNTER — Encounter: Payer: Self-pay | Admitting: Hematology

## 2018-11-05 NOTE — Telephone Encounter (Signed)
No los per 6/18. °

## 2018-11-11 ENCOUNTER — Telehealth: Payer: Self-pay | Admitting: Hematology

## 2018-11-11 ENCOUNTER — Other Ambulatory Visit: Payer: Self-pay | Admitting: Hematology

## 2018-11-11 ENCOUNTER — Other Ambulatory Visit: Payer: Self-pay | Admitting: Genetic Counselor

## 2018-11-11 DIAGNOSIS — C221 Intrahepatic bile duct carcinoma: Secondary | ICD-10-CM

## 2018-11-11 NOTE — Telephone Encounter (Signed)
Scheduled appt per 6/25 sch message - no answer/ called pt . Unable to leave message . Pt has an upcoming visit on 6/30 - put in comments for RN /PA to give patient new print out .

## 2018-11-16 ENCOUNTER — Telehealth: Payer: Self-pay | Admitting: *Deleted

## 2018-11-16 ENCOUNTER — Inpatient Hospital Stay: Payer: Medicare Other

## 2018-11-16 ENCOUNTER — Telehealth: Payer: Self-pay | Admitting: Nurse Practitioner

## 2018-11-16 ENCOUNTER — Encounter: Payer: Self-pay | Admitting: Nurse Practitioner

## 2018-11-16 ENCOUNTER — Inpatient Hospital Stay (HOSPITAL_BASED_OUTPATIENT_CLINIC_OR_DEPARTMENT_OTHER): Payer: Medicare Other | Admitting: Nurse Practitioner

## 2018-11-16 ENCOUNTER — Other Ambulatory Visit: Payer: Self-pay

## 2018-11-16 VITALS — BP 139/74 | HR 67 | Temp 98.7°F | Resp 18 | Ht 66.0 in | Wt 144.5 lb

## 2018-11-16 DIAGNOSIS — G629 Polyneuropathy, unspecified: Secondary | ICD-10-CM

## 2018-11-16 DIAGNOSIS — D649 Anemia, unspecified: Secondary | ICD-10-CM

## 2018-11-16 DIAGNOSIS — K6389 Other specified diseases of intestine: Secondary | ICD-10-CM

## 2018-11-16 DIAGNOSIS — D490 Neoplasm of unspecified behavior of digestive system: Secondary | ICD-10-CM

## 2018-11-16 DIAGNOSIS — D6481 Anemia due to antineoplastic chemotherapy: Secondary | ICD-10-CM

## 2018-11-16 DIAGNOSIS — C221 Intrahepatic bile duct carcinoma: Secondary | ICD-10-CM

## 2018-11-16 DIAGNOSIS — Z95828 Presence of other vascular implants and grafts: Secondary | ICD-10-CM

## 2018-11-16 DIAGNOSIS — I10 Essential (primary) hypertension: Secondary | ICD-10-CM

## 2018-11-16 DIAGNOSIS — Z5112 Encounter for antineoplastic immunotherapy: Secondary | ICD-10-CM | POA: Diagnosis not present

## 2018-11-16 DIAGNOSIS — E875 Hyperkalemia: Secondary | ICD-10-CM

## 2018-11-16 DIAGNOSIS — Z992 Dependence on renal dialysis: Secondary | ICD-10-CM

## 2018-11-16 DIAGNOSIS — Z87891 Personal history of nicotine dependence: Secondary | ICD-10-CM

## 2018-11-16 DIAGNOSIS — N186 End stage renal disease: Secondary | ICD-10-CM

## 2018-11-16 DIAGNOSIS — Z79899 Other long term (current) drug therapy: Secondary | ICD-10-CM

## 2018-11-16 LAB — CMP (CANCER CENTER ONLY)
ALT: 8 U/L (ref 0–44)
AST: 13 U/L — ABNORMAL LOW (ref 15–41)
Albumin: 3 g/dL — ABNORMAL LOW (ref 3.5–5.0)
Alkaline Phosphatase: 123 U/L (ref 38–126)
Anion gap: 11 (ref 5–15)
BUN: 40 mg/dL — ABNORMAL HIGH (ref 8–23)
CO2: 27 mmol/L (ref 22–32)
Calcium: 7.8 mg/dL — ABNORMAL LOW (ref 8.9–10.3)
Chloride: 99 mmol/L (ref 98–111)
Creatinine: 6.63 mg/dL (ref 0.44–1.00)
GFR, Est AFR Am: 7 mL/min — ABNORMAL LOW (ref >60)
GFR, Estimated: 6 mL/min — ABNORMAL LOW (ref >60)
Glucose, Bld: 91 mg/dL (ref 70–99)
Potassium: 6 mmol/L — ABNORMAL HIGH (ref 3.5–5.1)
Sodium: 137 mmol/L (ref 135–145)
Total Bilirubin: 0.4 mg/dL (ref 0.3–1.2)
Total Protein: 6.8 g/dL (ref 6.5–8.1)

## 2018-11-16 LAB — CBC WITH DIFFERENTIAL (CANCER CENTER ONLY)
Abs Immature Granulocytes: 0.04 10*3/uL (ref 0.00–0.07)
Basophils Absolute: 0 10*3/uL (ref 0.0–0.1)
Basophils Relative: 0 %
Eosinophils Absolute: 0.9 10*3/uL — ABNORMAL HIGH (ref 0.0–0.5)
Eosinophils Relative: 12 %
HCT: 22.4 % — ABNORMAL LOW (ref 36.0–46.0)
Hemoglobin: 7.2 g/dL — ABNORMAL LOW (ref 12.0–15.0)
Immature Granulocytes: 1 %
Lymphocytes Relative: 19 %
Lymphs Abs: 1.4 10*3/uL (ref 0.7–4.0)
MCH: 35 pg — ABNORMAL HIGH (ref 26.0–34.0)
MCHC: 32.1 g/dL (ref 30.0–36.0)
MCV: 108.7 fL — ABNORMAL HIGH (ref 80.0–100.0)
Monocytes Absolute: 0.9 10*3/uL (ref 0.1–1.0)
Monocytes Relative: 12 %
Neutro Abs: 4 10*3/uL (ref 1.7–7.7)
Neutrophils Relative %: 56 %
Platelet Count: 153 10*3/uL (ref 150–400)
RBC: 2.06 MIL/uL — ABNORMAL LOW (ref 3.87–5.11)
RDW: 14.1 % (ref 11.5–15.5)
WBC Count: 7.2 10*3/uL (ref 4.0–10.5)
nRBC: 0 % (ref 0.0–0.2)

## 2018-11-16 LAB — BASIC METABOLIC PANEL
Anion gap: 14 (ref 5–15)
BUN: 40 mg/dL — ABNORMAL HIGH (ref 8–23)
CO2: 26 mmol/L (ref 22–32)
Calcium: 8.3 mg/dL — ABNORMAL LOW (ref 8.9–10.3)
Chloride: 98 mmol/L (ref 98–111)
Creatinine, Ser: 6.67 mg/dL (ref 0.44–1.00)
GFR calc Af Amer: 7 mL/min — ABNORMAL LOW (ref >60)
GFR calc non Af Amer: 6 mL/min — ABNORMAL LOW (ref >60)
Glucose, Bld: 73 mg/dL (ref 70–99)
Potassium: 5.9 mmol/L — ABNORMAL HIGH (ref 3.5–5.1)
Sodium: 138 mmol/L (ref 135–145)

## 2018-11-16 LAB — MAGNESIUM: Magnesium: 1.9 mg/dL (ref 1.7–2.4)

## 2018-11-16 MED ORDER — LOKELMA 5 G PO PACK: 10.0000 g | PACK | Freq: Once | ORAL | 0 refills | Status: AC

## 2018-11-16 MED ORDER — SODIUM CHLORIDE 0.9 % IV SOLN
Freq: Once | INTRAVENOUS | Status: AC
  Administered 2018-11-16: 12:00:00 via INTRAVENOUS
  Filled 2018-11-16: qty 250

## 2018-11-16 MED ORDER — SODIUM CHLORIDE 0.9 % IV SOLN
10.0000 mg/m2 | Freq: Once | INTRAVENOUS | Status: AC
  Administered 2018-11-16: 14:00:00 17 mg via INTRAVENOUS
  Filled 2018-11-16: qty 17

## 2018-11-16 MED ORDER — PALONOSETRON HCL INJECTION 0.25 MG/5ML
0.2500 mg | Freq: Once | INTRAVENOUS | Status: AC
  Administered 2018-11-16: 12:00:00 0.25 mg via INTRAVENOUS

## 2018-11-16 MED ORDER — SODIUM CHLORIDE 0.9% FLUSH
10.0000 mL | INTRAVENOUS | Status: DC | PRN
  Administered 2018-11-16: 10 mL
  Filled 2018-11-16: qty 10

## 2018-11-16 MED ORDER — PALONOSETRON HCL INJECTION 0.25 MG/5ML
INTRAVENOUS | Status: AC
  Filled 2018-11-16: qty 5

## 2018-11-16 MED ORDER — SODIUM CHLORIDE 0.9 % IV SOLN
600.0000 mg/m2 | Freq: Once | INTRAVENOUS | Status: AC
  Administered 2018-11-16: 1026 mg via INTRAVENOUS
  Filled 2018-11-16: qty 26.98

## 2018-11-16 MED ORDER — SODIUM CHLORIDE 0.9 % IV SOLN
Freq: Once | INTRAVENOUS | Status: AC
  Administered 2018-11-16: 12:00:00 via INTRAVENOUS
  Filled 2018-11-16: qty 5

## 2018-11-16 MED ORDER — HEPARIN SOD (PORK) LOCK FLUSH 100 UNIT/ML IV SOLN
500.0000 [IU] | Freq: Once | INTRAVENOUS | Status: AC | PRN
  Administered 2018-11-16: 15:00:00 500 [IU]
  Filled 2018-11-16: qty 5

## 2018-11-16 NOTE — Telephone Encounter (Signed)
Per 6/30 los 1 unit RBC 7/2, before or after genetics appt.  Added 1 unit to the book for approval.

## 2018-11-16 NOTE — Progress Notes (Signed)
Potassium level of 6.0 called to Cira Rue, NP. Received OK to treat. [name] to call in new Rx to patient's pharmacy.

## 2018-11-16 NOTE — Progress Notes (Signed)
[place]   Telephone:([phone] Fax:(336) [phone]   Clinic Follow up Note   Patient Care Team: Mary Axe, MD as PCP - General (Family Medicine) Mary Pain, MD as PCP - Cardiology (Cardiology) Mary Melena, MD as Attending Physician (Nephrology) Mary Merle, MD as Consulting Physician (Hematology) 11/16/2018  CHIEF COMPLAINT: f/u cholangiocarcinoma   SUMMARY OF ONCOLOGIC HISTORY: Oncology History  Intrahepatic cholangiocarcinoma ([place])  02/23/2018 Imaging   CT AP W Contrast 02/23/18  IMPRESSION: 1. Large multi-cystic lesion in the central aspect of the liver adjacent to the gallbladder fossa, with several smaller satellite lesions. These are all new compared to prior CT the chest, abdomen and pelvis 08/07/2016, concerning for intrahepatic abscesses. 2. Extensive mural thickening and inflammatory changes in the region of the cecum. This is nonspecific, and could reflect either right-sided diverticulitis, focal area of colitis, or potentially even underlying neoplasm. 3. Cholelithiasis. Gallbladder is nearly completely contracted without surrounding inflammatory changes to suggest an acute cholecystitis at this time. 4. Left-sided nephrolithiasis measuring up to 8 mm in the upper pole collecting system of left kidney. No ureteral stones or findings of urinary tract obstruction. 5. Aortic atherosclerosis.   02/27/2018 Initial Biopsy   Diagnosis 02/27/18  Liver, needle/core biopsy, Right Hepatic Lobe - ADENOCARCINOMA. SEE NOTE.   02/27/2018 Miscellaneous     03/05/2018 Procedure   Colonoscopy 03/05/18  IMPRESSION - Preparation of the colon was fair. - Non-thrombosed external hemorrhoids found on digital rectal exam. - There was significant looping of the colon. - Severe diverticulosis in the recto-sigmoid colon, in the sigmoid colon, in the descending colon, in the transverse colon, at the hepatic flexure, in the ascending colon and in the cecum.  There was no evidence of diverticular bleeding. - A single (solitary) ulcer in the cecum - highly concerning for underlying malignancy. Biopsied. Phlegmonous change is possible, though often in setting of complicated diverticulosis/diverticulitis ulceration would not be as common. - Erythematous mucosa in the transverse colon, at the hepatic flexure, in the ascending colon and in the cecum. Biopsied. - Normal mucosa in the rectum, in the recto-sigmoid colon, in the sigmoid colon and in the descending colon. Biopsied. - Non-bleeding non-thrombosed external and internal hemorrhoids. -----Negative for malignancy    03/11/2018 Initial Diagnosis   Intrahepatic cholangiocarcinoma ([place])   04/02/2018 Imaging   CT CAP W contrast 04/02/18  IMPRESSION: 1. 4 mm subpleural nodule in the periphery of the right lower lobe. This is nonspecific, but strongly favored to represent a benign subpleural lymph node. Attention on follow-up studies is recommended to exclude the possibility of metastatic disease. 2. Diffuse bronchial wall thickening with moderate centrilobular and mild paraseptal emphysema; imaging findings suggestive of underlying COPD. 3. Aortic atherosclerosis, in addition to left main and left anterior descending coronary artery disease. Assessment for potential risk factor modification, dietary therapy or pharmacologic therapy may be warranted, if clinically indicated. 4. Nonobstructive calculi in the upper pole collecting system of left kidney measuring up to 7 mm.    04/27/2018 Cancer Staging   Staging form: Intrahepatic Bile Duct, AJCC 8th Edition - Clinical: Stage IIIB (cT2, cN1, cM0) - Signed by Mary Merle, MD on 04/27/2018   05/18/2018 -  Chemotherapy   Gemcitabine and Cisplatin every 2 weeksstarting on 05/18/2018, with 50% dose reduction   07/19/2018 Imaging   CT CAP W CONTRAST  IMPRESSION: 1. Dominant inferior liver mass is decreased in size. Two subcentimeter clustered  low-attenuation lesions in the far inferior right liver lobe, not discretely  visualized on the prior noncontrast CT study, can not exclude new small satellite hepatic metastases. 2. Porta hepatis adenopathy is mildly decreased. 3. Small right pulmonary nodules are stable. 4. No additional potential new or progressive metastatic disease. 5. Small pericardial effusion, slightly increased. 6. Cholelithiasis. 7. Moderate diffuse colonic diverticulosis. 8. Aortic Atherosclerosis (ICD10-I70.0) and Emphysema (ICD10-J43.9).    11/02/2018 Imaging   CT CAP WO contrast  IMPRESSION: 1. The dominant right hepatic lobe mass and most of the smaller right hepatic lobe lesions are stable. One of the right hepatic lobe lesions appears to have increased in size, formerly about 1.1 by 1.2 cm and currently 1.9 by 1.5 cm. 2. Two small right lower lobe pulmonary nodules are stable in size. 3. A hypodense right thyroid nodule has enlarged, currently 1.9 by 1.2 cm and formerly 1.1 by 0.7 cm. Consider thyroid ultrasound for further evaluation. 4. Other imaging findings of potential clinical significance: Aortic Atherosclerosis (ICD10-I70.0) and Emphysema (ICD10-J43.9). Coronary atherosclerosis. Mild cardiomegaly. Low-density blood pool suggests anemia. Nonobstructive left nephrolithiasis. Colonic diverticulosis.     CURRENT THERAPY: Gemcitabine and Cisplatin every 2 weeksstartingon 05/18/2018, with dose reduction  INTERVAL HISTORY: Mary Chapman returns for f/u and chemotherapy as scheduled. She completed cycle 13 on 11/04/18. Cisplatin was dose reduced for neuropathy. Tingling in her legs and feet is improved, she began 100 mg gabapentin at bedtime. She thinks she may need to take more. Denies neuropathy in her hands. Po intake is adequate. Denies mucositis. She is more fatigued, needing multiple breaks during ADLs. Also has mild DOE. Denies cough, dyspnea, fever, chills. Denies n/v/c/d or abdominal Chapman, last BM  1 day ago. Denies bleeding. Denies dysuria or hematuria.     MEDICAL HISTORY:  Past Medical History:  Diagnosis Date  . Diverticulitis   . Focal segmental glomerulosclerosis    ESRD on MWF HD  . Hypertension   . intrahepatic cholangio ca dx'd10/2019  . Renal insufficiency    dialysis pt MWF  . Tobacco abuse     SURGICAL HISTORY: Past Surgical History:  Procedure Laterality Date  . AV FISTULA PLACEMENT Left 03/18/2017   Procedure: Brachiocephalic ARTERIOVENOUS (AV) FISTULA CREATION left arm;  Surgeon: Elam Dutch, MD;  Location: [facility];  Service: Vascular;  Laterality: Left;  . BIOPSY  03/05/2018   Procedure: BIOPSY;  Surgeon: Rush Landmark Telford Nab., MD;  Location: [facility];  Service: Gastroenterology;;  enteroscopy bx /cytology brushing Greig Castilla bx  . COLONOSCOPY WITH PROPOFOL N/A 03/05/2018   Procedure: COLONOSCOPY WITH PROPOFOL;  Surgeon: Rush Landmark Telford Nab., MD;  Location: [facility];  Service: Gastroenterology;  Laterality: N/A;  Bx, Spot  . ENTEROSCOPY N/A 03/05/2018   Procedure: ENTEROSCOPY;  Surgeon: Rush Landmark Telford Nab., MD;  Location: [facility];  Service: Gastroenterology;  Laterality: N/A;  BX, Brushing, & Spot  . INSERTION OF DIALYSIS CATHETER N/A 03/18/2017   Procedure: INSERTION OF DIALYSIS CATHETER;  Surgeon: Elam Dutch, MD;  Location: [facility];  Service: Vascular;  Laterality: N/A;  . IR GENERIC HISTORICAL  08/15/2016   IR US GUIDE VASC ACCESS RIGHT 08/15/2016 Arne Cleveland, MD [facility]  . IR GENERIC HISTORICAL  08/15/2016   IR FLUORO GUIDE CV LINE RIGHT 08/15/2016 Arne Cleveland, MD [facility]  . IR IMAGING GUIDED PORT INSERTION  05/06/2018  . SUBMUCOSAL INJECTION  03/05/2018   Procedure: SUBMUCOSAL INJECTION;  Surgeon: Rush Landmark Telford Nab., MD;  Location: [facility];  Service: Gastroenterology;;  spot tatoo  . TUBAL LIGATION      I have reviewed the  social history and family history with the patient and they are unchanged from  previous note.  ALLERGIES:  has No Known Allergies.  MEDICATIONS:  Current Outpatient Medications  Medication Sig Dispense Refill  . amLODipine (NORVASC) 10 MG tablet     . folic acid (FOLVITE) 1 MG tablet Take 1 tablet (1 mg total) by mouth daily. 30 tablet 0  . gabapentin (NEURONTIN) 100 MG capsule Take 1 capsule (100 mg total) by mouth at bedtime. 60 capsule 1  . lidocaine-prilocaine (EMLA) cream Apply to affected area once 30 g 3  . metoprolol tartrate (LOPRESSOR) 25 MG tablet Take 0.5 tablets (12.5 mg total) by mouth 2 (two) times daily. 30 tablet 0  . multivitamin (RENA-VIT) TABS tablet Take 1 tablet by mouth at bedtime. 30 tablet 3  . Nutritional Supplements (FEEDING SUPPLEMENT, NEPRO CARB STEADY,) LIQD Take 237 mLs by mouth 3 (three) times daily as needed (Supplement). 90 Can 0  . ondansetron (ZOFRAN) 8 MG tablet Take 1 tablet (8 mg total) by mouth 2 (two) times daily as needed. Start on the third day after chemotherapy. 30 tablet 1  . pantoprazole (PROTONIX) 40 MG tablet TAKE 1 TABLET BY MOUTH TWICE A DAY 60 tablet 1  . polyethylene glycol (MIRALAX) packet Take 17 g by mouth daily. 30 each 0  . prochlorperazine (COMPAZINE) 10 MG tablet Take 1 tablet (10 mg total) by mouth every 6 (six) hours as needed (Nausea or vomiting). 30 tablet 1  . senna-docusate (SENOKOT-S) 8.6-50 MG tablet Take 2 tablets by mouth 2 (two) times daily. 30 tablet 0  . Vitamin D, Ergocalciferol, (DRISDOL) 50000 units CAPS capsule Take 50,000 Units by mouth once a week.  5  . nicotine (NICODERM CQ - DOSED IN MG/24 HOURS) 21 mg/24hr patch Place 1 patch (21 mg total) onto the skin daily. 28 patch 0  . oxyCODONE (OXY IR/ROXICODONE) 5 MG immediate release tablet Take 1 tablet (5 mg total) by mouth every 6 (six) hours as needed for severe Chapman. 30 tablet 0  . sodium zirconium cyclosilicate (LOKELMA) 5 g packet Take 10 g by mouth once for 1 dose. 2 packet 0   No current facility-administered medications for this visit.     Facility-Administered Medications Ordered in Other Visits  Medication Dose Route Frequency Provider Last Rate Last Dose  . sodium chloride flush (NS) 0.9 % injection 10 mL  10 mL Intracatheter PRN Mary Merle, MD   10 mL at 07/27/18 1745    PHYSICAL EXAMINATION: ECOG PERFORMANCE STATUS: 1-2   Vitals:   11/16/18 0859  BP: 139/74  Pulse: 67  Resp: 18  Temp: 98.7 F (37.1 C)  SpO2: 97%   Filed Weights   11/16/18 0859  Weight: 144 lb 8 oz (65.5 kg)    GENERAL:alert, no distress and comfortable SKIN: no rashes  EYES: sclera clear LUNGS: respirations even and unlabored  HEART: regular rate & rhythm, no lower extremity edema ABDOMEN: abdomen flat Musculoskeletal:no cyanosis of digits  NEURO: alert & oriented x 3 with fluent speech PAC without erythema  Limited exam for covi19 outbreak   LABORATORY DATA:  I have reviewed the data as listed CBC Latest Ref Rng & Units 11/16/2018 11/04/2018 10/19/2018  WBC 4.0 - 10.5 K/uL 7.2 6.1 6.8  Hemoglobin 12.0 - 15.0 g/dL 7.2(L) 7.8(L) 7.6(L)  Hematocrit 36.0 - 46.0 % 22.4(L) 24.5(L) 24.1(L)  Platelets 150 - 400 K/uL 153 233 168     CMP Latest Ref Rng & Units 11/16/2018 11/16/2018 11/04/2018  Glucose 70 - 99 mg/dL 73 91 90  BUN 8 - 23 mg/dL 40(H) 40(H) 40(H)  Creatinine 0.44 - 1.00 mg/dL 6.67(HH) 6.63(HH) 6.19(HH)  Sodium 135 - 145 mmol/L 138 137 139  Potassium 3.5 - 5.1 mmol/L 5.9(H) 6.0(H) 4.3  Chloride 98 - 111 mmol/L 98 99 99  CO2 22 - 32 mmol/L 26 27 26   Calcium 8.9 - 10.3 mg/dL 8.3(L) 7.8(L) 8.4(L)  Total Protein 6.5 - 8.1 g/dL - 6.8 7.0  Total Bilirubin 0.3 - 1.2 mg/dL - 0.4 0.4  Alkaline Phos 38 - 126 U/L - 123 134(H)  AST 15 - 41 U/L - 13(L) 14(L)  ALT 0 - 44 U/L - 8 8      RADIOGRAPHIC STUDIES: I have personally reviewed the radiological images as listed and agreed with the findings in the report. No results found.   ASSESSMENT & PLAN: Lakeena Downie a 73 y.o.femalewith   1. Adenocarcinomainliver,  likelyintrahepatic cholangiocarcinoma, cT2N1M0 stage IIIB, unresectable, MSI-Stable -Initially diagnosed in 02/2018; she was seen by Dr. Cloyd Stagers at [facility] and deemed not a surgical candidate. She began first line systemic chemotherapy with dose-reduced gemcitabine and cisplatin on 05/18/2018. She required RBC transfusion after cycle 1and cycle 9 (hgb 7.6)but otherwise tolerated well.  -her abdominal Chapman resolved after starting chemotherapy, shedoes not use oxycodone -Restaging CT on 07/19/2018 was previously reviewed, showed partial response. She continues chemotherapyQ2 weeks.  Restaging CT on 11/03/18 was previously reviewed by Dr. Burr Medico which showed stable disease in the liver, and mildly increased thyroid nodule. MD recommended to continue chemotherapy with current regimen and restage in 2-3 more months.  -Cisplatin was reduced to 10 mg/m2 with cycle 13 due to neuropathy  -Ms. Streiff appears stable today. She continues to tolerate chemotherapy moderately well, with increased fatigue lately. Likely due to worsening anemia, Hb 7.2 today. Will transfuse 1 unit RBC this week (Thursday) -labs reviewed. CBC otherwise stable. Cr stable, on HD MWF, K 6.0, repeated peripherally is 5.9. she is not on K supplement. I discussed with her nephrologist Dr. Augustin Coupe, he recommends lokelma 10 g x1. OK to give chemo. She will return for HD tomorrow and Fri.  -Proceed with chemotherapy today, I discussed with Dr. Burr Medico, pharmacy, and infusion RN -F/u in 2 weeks with next cycle  2. Productive cough  -intermittent and ongoing since beginning of this year  -CT chestdating back to3/2018 and as recent as 07/2018 shows moderate emphysema. She is a former smoker.  -no sick contacts or known covid exposure. Wears mask at dialysis.  -chest xray per Dr. Amalia Greenhouse 09/23/18 was negative for active disease -denies cough today  3. HTN  -Currently on metoprololandamlodipine per Dr. Augustin Coupe -[id number]. Monitoring  4. End Stage Renal  Disease, on HD MWF -followed bynephrologist Dr. Augustin Coupe -Cr. 6.63 today -CMP with K 6.0, and 5.9 on peripheral Bmet repeat. I discussed with Dr. Augustin Coupe who recommends lokelma 10 g x1. She will be back for dialysis [facility] this week. Patient is aware of new prescription. -Mg normal    5. Anemia of chronic diseaseand chemo induced anemia -Due to CKD and malignancy -s/p RBC transfusion after cycle 1and cycle 9 (hgb 7.6 and symptomatic) -MD and I previously discussed the role of EPO if her anemia progresses and she requires frequent blood transfusions. She was informed of risks of potential tumor growth and thrombotic event. She understands -Hgbtrending down, 7.2 today; she is symptomatic with moderate fatigue and DOE -Blood shortage has improved, will transfuse 1 unit RBCs this week  6. Neuropathy -she developed neuropathy in lower legs and feet, she recently started gabapentin 100 mg qHS -neuropathy has improved, she can take up to 200 mg HS and 100 mg during the day if needed, she understands.  7.Goal of care discussion- treatment is palliative; full code  Plan -Labs reviewed, proceed with cycle 14 cisplatin and gemcitabine, no dose adjustment -1 unit RBC 7/2  -K 5.9-6.0, new Rx: lokelma 10 g x1 sent to her pharmacy per Dr. Augustin Coupe  -continue HD MWF -f/u in 2 weeks with next cycle    Orders Placed This Encounter  Procedures  . Magnesium    Standing Status:   Standing    Number of Occurrences:   5    Standing Expiration Date:   11/16/2019  . Basic metabolic panel    Please draw peripheral  . Practitioner attestation of consent    I, the ordering practitioner, attest that I have discussed with the patient the benefits, risks, side effects, alternatives, likelihood of achieving goals and potential problems during recovery for the procedure listed.    Standing Status:   Future    Standing Expiration Date:   11/16/2019    Order Specific Question:   Procedure    Answer:   Blood Product(s)   . Complete patient signature process for consent form    Standing Status:   Future    Standing Expiration Date:   11/16/2019  . Care order/instruction    Transfuse Parameters    Standing Status:   Future    Standing Expiration Date:   11/16/2019  . Type and screen    Standing Status:   Future    Number of Occurrences:   1    Standing Expiration Date:   11/16/2019   All questions were answered. The patient knows to call the clinic with any problems, questions or concerns. No barriers to learning was detected. I spent 20 minutes counseling the patient face to face. The total time spent in the appointment was 25 minutes and more than 50% was on counseling and review of test results     Alla Feeling, NP 11/16/18

## 2018-11-16 NOTE — Patient Instructions (Signed)
[facility] Discharge Instructions for Patients Receiving Chemotherapy  Today you received the following chemotherapy agents: Gemzar and Cisplatin   To help prevent nausea and vomiting after your treatment, we encourage you to take your nausea medication as directed.    If you develop nausea and vomiting that is not controlled by your nausea medication, call the clinic.   BELOW ARE SYMPTOMS THAT SHOULD BE REPORTED IMMEDIATELY:  *FEVER GREATER THAN 100.5 F  *CHILLS WITH OR WITHOUT FEVER  NAUSEA AND VOMITING THAT IS NOT CONTROLLED WITH YOUR NAUSEA MEDICATION  *UNUSUAL SHORTNESS OF BREATH  *UNUSUAL BRUISING OR BLEEDING  TENDERNESS IN MOUTH AND THROAT WITH OR WITHOUT PRESENCE OF ULCERS  *URINARY PROBLEMS  *BOWEL PROBLEMS  UNUSUAL RASH Items with * indicate a potential emergency and should be followed up as soon as possible.  Feel free to call the clinic should you have any questions or concerns. The clinic phone number is [phone].  Please show the [facility] at check-in to the Emergency Department and triage nurse.  Coronavirus (COVID-19) Are you at risk?  Are you at risk for the Coronavirus (COVID-19)?  To be considered HIGH RISK for Coronavirus (COVID-19), you have to meet the following criteria:  . Traveled to [place], [place], [place], [place] or [place]; or in the [place] to [place], [place], [place], or [place]; and have fever, cough, and shortness of breath within the last 2 weeks of travel OR . Been in close contact with a person diagnosed with COVID-19 within the last 2 weeks and have fever, cough, and shortness of breath . IF YOU DO NOT MEET THESE CRITERIA, YOU ARE CONSIDERED LOW RISK FOR COVID-19.  What to do if you are HIGH RISK for COVID-19?  Marland Kitchen If you are having a medical emergency, call 911. . Seek medical care right away. Before you go to a doctor's office, urgent care or emergency department, call ahead and tell them  about your recent travel, contact with someone diagnosed with COVID-19, and your symptoms. You should receive instructions from your physician's office regarding next steps of care.  . When you arrive at healthcare provider, tell the healthcare staff immediately you have returned from visiting [place], [place], [place], [place] or [place]; or traveled in the [place] to [place], [place], [place], or [place]; in the last two weeks or you have been in close contact with a person diagnosed with COVID-19 in the last 2 weeks.   . Tell the health care staff about your symptoms: fever, cough and shortness of breath. . After you have been seen by a medical provider, you will be either: o Tested for (COVID-19) and discharged home on quarantine except to seek medical care if symptoms worsen, and asked to  - Stay home and avoid contact with others until you get your results (4-5 days)  - Avoid travel on public transportation if possible (such as bus, train, or airplane) or o Sent to the Emergency Department by EMS for evaluation, COVID-19 testing, and possible admission depending on your condition and test results.  What to do if you are LOW RISK for COVID-19?  Reduce your risk of any infection by using the same precautions used for avoiding the common cold or flu:  Marland Kitchen Wash your hands often with soap and warm water for at least 20 seconds.  If soap and water are not readily available, use an alcohol-based hand sanitizer with at least 60% alcohol.  Marland Kitchen  If coughing or sneezing, cover your mouth and nose by coughing or sneezing into the elbow areas of your shirt or coat, into a tissue or into your sleeve (not your hands). . Avoid shaking hands with others and consider head nods or verbal greetings only. . Avoid touching your eyes, nose, or mouth with unwashed hands.  . Avoid close contact with people who are sick. . Avoid places or events with large numbers of people in one location, like concerts or  sporting events. . Carefully consider travel plans you have or are making. . If you are planning any travel outside or inside the [place], visit the CDC's Travelers' Health webpage for the latest health notices. . If you have some symptoms but not all symptoms, continue to monitor at home and seek medical attention if your symptoms worsen. . If you are having a medical emergency, call 911.  Coronavirus (COVID-19) Are you at risk?  Are you at risk for the Coronavirus (COVID-19)?  To be considered HIGH RISK for Coronavirus (COVID-19), you have to meet the following criteria:  . Traveled to [place], [place], [place], [place] or [place]; or in the [place] to [place], [place], [place], or [place]; and have fever, cough, and shortness of breath within the last 2 weeks of travel OR . Been in close contact with a person diagnosed with COVID-19 within the last 2 weeks and have fever, cough, and shortness of breath . IF YOU DO NOT MEET THESE CRITERIA, YOU ARE CONSIDERED LOW RISK FOR COVID-19.  What to do if you are HIGH RISK for COVID-19?  Marland Kitchen If you are having a medical emergency, call 911. . Seek medical care right away. Before you go to a doctor's office, urgent care or emergency department, call ahead and tell them about your recent travel, contact with someone diagnosed with COVID-19, and your symptoms. You should receive instructions from your physician's office regarding next steps of care.  . When you arrive at healthcare provider, tell the healthcare staff immediately you have returned from visiting [place], [place], [place], [place] or [place]; or traveled in the [place] to [place], [place], [place], or [place]; in the last two weeks or you have been in close contact with a person diagnosed with COVID-19 in the last 2 weeks.   . Tell the health care staff about your symptoms: fever, cough and shortness of breath. . After you have been seen by a medical provider, you will be  either: o Tested for (COVID-19) and discharged home on quarantine except to seek medical care if symptoms worsen, and asked to  - Stay home and avoid contact with others until you get your results (4-5 days)  - Avoid travel on public transportation if possible (such as bus, train, or airplane) or o Sent to the Emergency Department by EMS for evaluation, COVID-19 testing, and possible admission depending on your condition and test results.  What to do if you are LOW RISK for COVID-19?  Reduce your risk of any infection by using the same precautions used for avoiding the common cold or flu:  Marland Kitchen Wash your hands often with soap and warm water for at least 20 seconds.  If soap and water are not readily available, use an alcohol-based hand sanitizer with at least 60% alcohol.  . If coughing or sneezing, cover your mouth and nose by coughing or sneezing into the elbow areas of your shirt or coat, into a tissue or into your  sleeve (not your hands). . Avoid shaking hands with others and consider head nods or verbal greetings only. . Avoid touching your eyes, nose, or mouth with unwashed hands.  . Avoid close contact with people who are sick. . Avoid places or events with large numbers of people in one location, like concerts or sporting events. . Carefully consider travel plans you have or are making. . If you are planning any travel outside or inside the [place], visit the CDC's Travelers' Health webpage for the latest health notices. . If you have some symptoms but not all symptoms, continue to monitor at home and seek medical attention if your symptoms worsen. . If you are having a medical emergency, call 911.   [place] / e-Visit: [email or web address]         [facility]: [facility]: [phone]                   [facility]: [phone]

## 2018-11-16 NOTE — Telephone Encounter (Signed)
FYI Received call report from [facility]  "Today's Creat = 6.63.  K+ = 6.0 was repeated"  Secure chat sent with results.  Received read confirmation from nurse.

## 2018-11-16 NOTE — Addendum Note (Signed)
Addended by: Truitt Merle on: 11/16/2018 12:04 PM   Modules accepted: Orders

## 2018-11-17 ENCOUNTER — Other Ambulatory Visit: Payer: Self-pay | Admitting: Emergency Medicine

## 2018-11-17 ENCOUNTER — Other Ambulatory Visit: Payer: Medicare Other

## 2018-11-17 ENCOUNTER — Telehealth: Payer: Self-pay | Admitting: Licensed Clinical Social Worker

## 2018-11-17 ENCOUNTER — Ambulatory Visit: Payer: Medicare Other

## 2018-11-17 ENCOUNTER — Ambulatory Visit: Payer: Medicare Other | Admitting: Hematology

## 2018-11-17 DIAGNOSIS — D649 Anemia, unspecified: Secondary | ICD-10-CM

## 2018-11-17 LAB — CANCER ANTIGEN 19-9: CA 19-9: 36 U/mL — ABNORMAL HIGH (ref 0–35)

## 2018-11-17 NOTE — Telephone Encounter (Signed)
Called patient regarding upcoming Webex appointment, per patient's request this will be a walk-in visit. °

## 2018-11-17 NOTE — Telephone Encounter (Signed)
Contacted patient to verify webex visit for pre reg °

## 2018-11-18 ENCOUNTER — Inpatient Hospital Stay: Payer: Medicare Other

## 2018-11-18 ENCOUNTER — Other Ambulatory Visit: Payer: Self-pay

## 2018-11-18 ENCOUNTER — Inpatient Hospital Stay: Payer: Medicare Other | Attending: Hematology | Admitting: Licensed Clinical Social Worker

## 2018-11-18 DIAGNOSIS — Z79899 Other long term (current) drug therapy: Secondary | ICD-10-CM | POA: Diagnosis not present

## 2018-11-18 DIAGNOSIS — N186 End stage renal disease: Secondary | ICD-10-CM | POA: Insufficient documentation

## 2018-11-18 DIAGNOSIS — C221 Intrahepatic bile duct carcinoma: Secondary | ICD-10-CM

## 2018-11-18 DIAGNOSIS — D6481 Anemia due to antineoplastic chemotherapy: Secondary | ICD-10-CM | POA: Insufficient documentation

## 2018-11-18 DIAGNOSIS — Z87891 Personal history of nicotine dependence: Secondary | ICD-10-CM | POA: Diagnosis not present

## 2018-11-18 DIAGNOSIS — G629 Polyneuropathy, unspecified: Secondary | ICD-10-CM | POA: Insufficient documentation

## 2018-11-18 DIAGNOSIS — T451X5A Adverse effect of antineoplastic and immunosuppressive drugs, initial encounter: Secondary | ICD-10-CM | POA: Insufficient documentation

## 2018-11-18 DIAGNOSIS — Z5111 Encounter for antineoplastic chemotherapy: Secondary | ICD-10-CM | POA: Diagnosis present

## 2018-11-18 DIAGNOSIS — Z992 Dependence on renal dialysis: Secondary | ICD-10-CM | POA: Diagnosis not present

## 2018-11-18 DIAGNOSIS — I10 Essential (primary) hypertension: Secondary | ICD-10-CM | POA: Insufficient documentation

## 2018-11-18 DIAGNOSIS — D649 Anemia, unspecified: Secondary | ICD-10-CM

## 2018-11-18 LAB — PREPARE RBC (CROSSMATCH)

## 2018-11-18 MED ORDER — SODIUM CHLORIDE 0.9% FLUSH
3.0000 mL | INTRAVENOUS | Status: DC | PRN
  Filled 2018-11-18: qty 10

## 2018-11-18 MED ORDER — HEPARIN SOD (PORK) LOCK FLUSH 100 UNIT/ML IV SOLN
500.0000 [IU] | Freq: Every day | INTRAVENOUS | Status: AC | PRN
  Administered 2018-11-18: 500 [IU]
  Filled 2018-11-18: qty 5

## 2018-11-18 MED ORDER — SODIUM CHLORIDE 0.9% FLUSH
10.0000 mL | INTRAVENOUS | Status: AC | PRN
  Administered 2018-11-18: 10 mL
  Filled 2018-11-18: qty 10

## 2018-11-18 MED ORDER — HEPARIN SOD (PORK) LOCK FLUSH 100 UNIT/ML IV SOLN
250.0000 [IU] | INTRAVENOUS | Status: DC | PRN
  Filled 2018-11-18: qty 5

## 2018-11-18 MED ORDER — SODIUM CHLORIDE 0.9% IV SOLUTION
250.0000 mL | Freq: Once | INTRAVENOUS | Status: AC
  Administered 2018-11-18: 14:00:00 250 mL via INTRAVENOUS
  Filled 2018-11-18: qty 250

## 2018-11-18 NOTE — Patient Instructions (Signed)
Blood Transfusion, Adult, Care After This sheet gives you information about how to care for yourself after your procedure. Your doctor may also give you more specific instructions. If you have problems or questions, contact your doctor. Follow these instructions at home:   Take over-the-counter and prescription medicines only as told by your doctor.  Go back to your normal activities as told by your doctor.  Follow instructions from your doctor about how to take care of the area where an IV tube was put into your vein (insertion site). Make sure you: ? Wash your hands with soap and water before you change your bandage (dressing). If there is no soap and water, use hand sanitizer. ? Change your bandage as told by your doctor.  Check your IV insertion site every day for signs of infection. Check for: ? More redness, swelling, or pain. ? More fluid or blood. ? Warmth. ? Pus or a bad smell. Contact a doctor if:  You have more redness, swelling, or pain around the IV insertion site.  You have more fluid or blood coming from the IV insertion site.  Your IV insertion site feels warm to the touch.  You have pus or a bad smell coming from the IV insertion site.  Your pee (urine) turns pink, red, or brown.  You feel weak after doing your normal activities. Get help right away if:  You have signs of a serious allergic or body defense (immune) system reaction, including: ? Itchiness. ? Hives. ? Trouble breathing. ? Anxiety. ? Pain in your chest or lower back. ? Fever, flushing, and chills. ? Fast pulse. ? Rash. ? Watery poop (diarrhea). ? Throwing up (vomiting). ? Dark pee. ? Serious headache. ? Dizziness. ? Stiff neck. ? Yellow color in your face or the white parts of your eyes (jaundice). Summary  After a blood transfusion, return to your normal activities as told by your doctor.  Every day, check for signs of infection where the IV tube was put into your vein.  Some  signs of infection are warm skin, more redness and pain, more fluid or blood, and pus or a bad smell where the needle went in.  Contact your doctor if you feel weak or have any unusual symptoms. This information is not intended to replace advice given to you by your health care provider. Make sure you discuss any questions you have with your health care provider. Document Released: 05/26/2014 Document Revised: 09/09/2017 Document Reviewed: 12/28/2015 Elsevier Patient Education  2020 Elsevier Inc.  

## 2018-11-18 NOTE — Progress Notes (Signed)
Pt received 1 unit PRBCs today, tolerated well.  Ate and drank during transfusion.  VSS.  No premeds (benadryl & tylenol) were ordered or administered as pt has no hx of reaction and tolerates transfusions well without premeds.  Ambulatory w/steady gait to exit with belongings, family on the way to take her home.

## 2018-11-18 NOTE — Progress Notes (Signed)
REFERRING PROVIDER: Truitt Merle, MD [place],  [place] [id number]  PRIMARY PROVIDER:  Glendon Axe, MD  PRIMARY REASON FOR VISIT:  1. Cholangiocarcinoma ([place])      HISTORY OF PRESENT ILLNESS:   Mary Chapman, a 73 y.o. female, was seen for a [place] cancer genetics consultation at the request of Dr. Burr Medico due to her recent [facility] results that revealed a BRCA1 mutation.  Mary Chapman presents to clinic today to discuss the possibility of a hereditary predisposition to cancer, genetic testing, and to further clarify her future cancer risks, as well as potential cancer risks for family members.   In 2019, at the age of 84, Mary Chapman was diagnosed with intrahepatic cholangiocarcinoma. The treatment plan includes chemotherapy.    CANCER HISTORY:  Oncology History  Intrahepatic cholangiocarcinoma ([place])  02/23/2018 Imaging   CT AP W Contrast 02/23/18  IMPRESSION: 1. Large multi-cystic lesion in the central aspect of the liver adjacent to the gallbladder fossa, with several smaller satellite lesions. These are all new compared to prior CT the chest, abdomen and pelvis 08/07/2016, concerning for intrahepatic abscesses. 2. Extensive mural thickening and inflammatory changes in the region of the cecum. This is nonspecific, and could reflect either right-sided diverticulitis, focal area of colitis, or potentially even underlying neoplasm. 3. Cholelithiasis. Gallbladder is nearly completely contracted without surrounding inflammatory changes to suggest an acute cholecystitis at this time. 4. Left-sided nephrolithiasis measuring up to 8 mm in the upper pole collecting system of left kidney. No ureteral stones or findings of urinary tract obstruction. 5. Aortic atherosclerosis.   02/27/2018 Initial Biopsy   Diagnosis 02/27/18  Liver, needle/core biopsy, Right Hepatic Lobe - ADENOCARCINOMA. SEE NOTE.   02/27/2018 Miscellaneous     03/05/2018 Procedure   Colonoscopy  03/05/18  IMPRESSION - Preparation of the colon was fair. - Non-thrombosed external hemorrhoids found on digital rectal exam. - There was significant looping of the colon. - Severe diverticulosis in the recto-sigmoid colon, in the sigmoid colon, in the descending colon, in the transverse colon, at the hepatic flexure, in the ascending colon and in the cecum. There was no evidence of diverticular bleeding. - A single (solitary) ulcer in the cecum - highly concerning for underlying malignancy. Biopsied. Phlegmonous change is possible, though often in setting of complicated diverticulosis/diverticulitis ulceration would not be as common. - Erythematous mucosa in the transverse colon, at the hepatic flexure, in the ascending colon and in the cecum. Biopsied. - Normal mucosa in the rectum, in the recto-sigmoid colon, in the sigmoid colon and in the descending colon. Biopsied. - Non-bleeding non-thrombosed external and internal hemorrhoids. -----Negative for malignancy    03/11/2018 Initial Diagnosis   Intrahepatic cholangiocarcinoma ([place])   04/02/2018 Imaging   CT CAP W contrast 04/02/18  IMPRESSION: 1. 4 mm subpleural nodule in the periphery of the right lower lobe. This is nonspecific, but strongly favored to represent a benign subpleural lymph node. Attention on follow-up studies is recommended to exclude the possibility of metastatic disease. 2. Diffuse bronchial wall thickening with moderate centrilobular and mild paraseptal emphysema; imaging findings suggestive of underlying COPD. 3. Aortic atherosclerosis, in addition to left main and left anterior descending coronary artery disease. Assessment for potential risk factor modification, dietary therapy or pharmacologic therapy may be warranted, if clinically indicated. 4. Nonobstructive calculi in the upper pole collecting system of left kidney measuring up to 7 mm.    04/27/2018 Cancer Staging   Staging form: Intrahepatic  Bile Duct, AJCC 8th Edition -  Clinical: Stage IIIB (cT2, cN1, cM0) - Signed by Truitt Merle, MD on 04/27/2018   05/18/2018 -  Chemotherapy   Gemcitabine and Cisplatin every 2 weeksstarting on 05/18/2018, with 50% dose reduction   07/19/2018 Imaging   CT CAP W CONTRAST  IMPRESSION: 1. Dominant inferior liver mass is decreased in size. Two subcentimeter clustered low-attenuation lesions in the far inferior right liver lobe, not discretely visualized on the prior noncontrast CT study, can not exclude new small satellite hepatic metastases. 2. Porta hepatis adenopathy is mildly decreased. 3. Small right pulmonary nodules are stable. 4. No additional potential new or progressive metastatic disease. 5. Small pericardial effusion, slightly increased. 6. Cholelithiasis. 7. Moderate diffuse colonic diverticulosis. 8. Aortic Atherosclerosis (ICD10-I70.0) and Emphysema (ICD10-J43.9).    11/02/2018 Imaging   CT CAP WO contrast  IMPRESSION: 1. The dominant right hepatic lobe mass and most of the smaller right hepatic lobe lesions are stable. One of the right hepatic lobe lesions appears to have increased in size, formerly about 1.1 by 1.2 cm and currently 1.9 by 1.5 cm. 2. Two small right lower lobe pulmonary nodules are stable in size. 3. A hypodense right thyroid nodule has enlarged, currently 1.9 by 1.2 cm and formerly 1.1 by 0.7 cm. Consider thyroid ultrasound for further evaluation. 4. Other imaging findings of potential clinical significance: Aortic Atherosclerosis (ICD10-I70.0) and Emphysema (ICD10-J43.9). Coronary atherosclerosis. Mild cardiomegaly. Low-density blood pool suggests anemia. Nonobstructive left nephrolithiasis. Colonic diverticulosis.      RISK FACTORS:  Menarche was at age 53.  First live birth at age 36.  OCP use for approximately 4 years.  Ovaries intact: yes.  Hysterectomy: no.  Menopausal status: postmenopausal.  HRT use: 1 years.  Past Medical History:   Diagnosis Date  . Diverticulitis   . Focal segmental glomerulosclerosis    ESRD on MWF HD  . Hypertension   . intrahepatic cholangio ca dx'd10/2019  . Renal insufficiency    dialysis pt MWF  . Tobacco abuse     Past Surgical History:  Procedure Laterality Date  . AV FISTULA PLACEMENT Left 03/18/2017   Procedure: Brachiocephalic ARTERIOVENOUS (AV) FISTULA CREATION left arm;  Surgeon: Elam Dutch, MD;  Location: [place];  Service: Vascular;  Laterality: Left;  . BIOPSY  03/05/2018   Procedure: BIOPSY;  Surgeon: Rush Landmark Telford Nab., MD;  Location: [place];  Service: Gastroenterology;;  enteroscopy bx /cytology brushing Greig Castilla bx  . COLONOSCOPY WITH PROPOFOL N/A 03/05/2018   Procedure: COLONOSCOPY WITH PROPOFOL;  Surgeon: Rush Landmark Telford Nab., MD;  Location: [place];  Service: Gastroenterology;  Laterality: N/A;  Bx, Spot  . ENTEROSCOPY N/A 03/05/2018   Procedure: ENTEROSCOPY;  Surgeon: Rush Landmark Telford Nab., MD;  Location: [place];  Service: Gastroenterology;  Laterality: N/A;  BX, Brushing, & Spot  . INSERTION OF DIALYSIS CATHETER N/A 03/18/2017   Procedure: INSERTION OF DIALYSIS CATHETER;  Surgeon: Elam Dutch, MD;  Location: [place];  Service: Vascular;  Laterality: N/A;  . IR GENERIC HISTORICAL  08/15/2016   IR US GUIDE VASC ACCESS RIGHT 08/15/2016 Arne Cleveland, MD [facility]  . IR GENERIC HISTORICAL  08/15/2016   IR FLUORO GUIDE CV LINE RIGHT 08/15/2016 Arne Cleveland, MD [facility]  . IR IMAGING GUIDED PORT INSERTION  05/06/2018  . SUBMUCOSAL INJECTION  03/05/2018   Procedure: SUBMUCOSAL INJECTION;  Surgeon: Rush Landmark Telford Nab., MD;  Location: [facility];  Service: Gastroenterology;;  spot tatoo  . TUBAL LIGATION      Social History   Socioeconomic History  .  Marital status: Widowed    Spouse name: Not on file  . Number of children: Not on file  . Years of education: Not on file  . Highest education level: Not on file   Occupational History  . Occupation: retired  Scientific laboratory technician  . Financial resource strain: Not on file  . Food insecurity    Worry: Not on file    Inability: Not on file  . Transportation needs    Medical: Not on file    Non-medical: Not on file  Tobacco Use  . Smoking status: Current Some Day Smoker    Packs/day: 0.25    Years: 20.00    Pack years: 5.00    Types: Cigarettes  . Smokeless tobacco: Never Used  . Tobacco comment: 3 cigs/day  Substance and Sexual Activity  . Alcohol use: No    Comment: moderate drinker for 10 years, quit in 30 years  . Drug use: No  . Sexual activity: Not Currently    Birth control/protection: Post-menopausal  Lifestyle  . Physical activity    Days per week: Not on file    Minutes per session: Not on file  . Stress: Not on file  Relationships  . Social Herbalist on phone: Not on file    Gets together: Not on file    Attends religious service: Not on file    Active member of club or organization: Not on file    Attends meetings of clubs or organizations: Not on file    Relationship status: Not on file  Other Topics Concern  . Not on file  Social History Narrative  . Not on file     FAMILY HISTORY:  We obtained a detailed, 4-generation family history.  Significant diagnoses are listed below: Family History  Problem Relation Age of Onset  . Hypertension Mother   . Diabetes Mother   . Hypertension Father   . Diabetes Father   . Hypertension Sister   . Hypertension Brother     Mary Chapman has one son, 23, and a daughter, 48, no cancer diagnoses. She has 7 sisters and 2 brothers, no cancers.   Mary Chapman mother is living at 1 with no cancer history. Patient has 5 maternal aunts, 5 maternal uncles, no known cancers in these individuals or in her maternal cousins. No known cancers for maternal grandparents, unknown ages of death.  Mary Chapman father died at 38. Patient has 5 paternal aunts, 5 paternal uncles, no known cancers  in these individuals or in paternal cousins. No known cancers in paternal grandparents, unknown ages of death.  Mary Chapman is unaware of previous family history of genetic testing for hereditary cancer risks. There is no reported Ashkenazi Jewish ancestry. There is no known consanguinity.  GENETIC COUNSELING ASSESSMENT: Mary Chapman is a 73 y.o. female with Foundation One testing that revealed a BRCA1 mutation. We, therefore, discussed and recommended the following at today's visit.   DISCUSSION: We discussed that intrahepatic cholangiocarcinoma is typically not hereditary, and that her family history is not consistent with a hereditary cancer syndrome. However, given the testing done on her tumor that revealed a BRCA1 mutation, we want to see if she was born with this mutation or if it is just in the tumor itself.   We reviewed the characteristics, features and inheritance patterns of hereditary cancer syndromes. We also discussed genetic testing, including the appropriate family members to test, the process of testing, insurance coverage and turn-around-time for results. We  discussed the implications of a negative, positive and/or variant of uncertain significant result. We recommended Mary Chapman pursue genetic testing for the Common Hereditary Cancers gene panel.   The Common Hereditary Cancers Panel offered by [facility] includes sequencing and/or deletion duplication testing of the following 48 genes: APC, ATM, AXIN2, BARD1, BMPR1A, BRCA1, BRCA2, BRIP1, CDH1, CDKN2A (p14ARF), CDKN2A (p16INK4a), CKD4, CHEK2, CTNNA1, DICER1, EPCAM (Deletion/duplication testing only), GREM1 (promoter region deletion/duplication testing only), KIT, MEN1, MLH1, MSH2, MSH3, MSH6, MUTYH, NBN, NF1, NHTL1, PALB2, PDGFRA, PMS2, POLD1, POLE, PTEN, RAD50, RAD51C, RAD51D, RNF43, SDHB, SDHC, SDHD, SMAD4, SMARCA4. STK11, TP53, TSC1, TSC2, and VHL.  The following genes were evaluated for sequence changes only: SDHA and HOXB13 c.251G>A  variant only.  Based on Mary Chapman's tumor testing revealing a BRCA1 mutation, she meets medical criteria for genetic testing. Despite that she meets criteria, she may still have an out of pocket cost.   PLAN: After considering the risks, benefits, and limitations, Mary Chapman provided informed consent to pursue genetic testing and the blood sample was sent to [facility] for analysis of the Common Hereditary Cancers Panel. Results should be available within approximately 2-3 weeks' time, at which point they will be disclosed by telephone to Mary Chapman, as will any additional recommendations warranted by these results. Mary Chapman will receive a summary of her genetic counseling visit and a copy of her results once available. This information will also be available in Epic.   Lastly, we encouraged Mary Chapman to remain in contact with cancer genetics annually so that we can continuously update the family history and inform her of any changes in cancer genetics and testing that may be of benefit for this family.   Ms. Darrah questions were answered to her satisfaction today. Our contact information was provided should additional questions or concerns arise. Thank you for the referral and allowing [place] to share in the care of your patient.   Faith Rogue, MS Genetic Counselor [email or web address] Phone: [phone]  The patient was seen for a total of 20 minutes in face-to-face genetic counseling.  Drs. Magrinat, Lindi Adie and/or Burr Medico were available for discussion regarding this case.

## 2018-11-20 LAB — TYPE AND SCREEN
ABO/RH(D): O POS
Antibody Screen: NEGATIVE
Unit division: 0

## 2018-11-20 LAB — BPAM RBC
Blood Product Expiration Date: 202007282359
ISSUE DATE / TIME: 202007021358
Unit Type and Rh: 5100

## 2018-11-26 ENCOUNTER — Other Ambulatory Visit: Payer: Self-pay | Admitting: Hematology

## 2018-11-26 DIAGNOSIS — C221 Intrahepatic bile duct carcinoma: Secondary | ICD-10-CM

## 2018-11-29 NOTE — Progress Notes (Signed)
[place]   Telephone:([phone]) [phone] Fax:(336) [phone]   Clinic Follow up Note   Patient Care Team: Glendon Axe, MD as PCP - General (Family Medicine) [name], MD as PCP - Cardiology (Cardiology) Dwana Melena, MD as Attending Physician (Nephrology) Truitt Merle, MD as Consulting Physician (Hematology)  Date of Service:  12/02/2018  CHIEF COMPLAINT: F/u on intrahepatic cholangiocarcinoma  SUMMARY OF ONCOLOGIC HISTORY: Oncology History  Intrahepatic cholangiocarcinoma ([place])  02/23/2018 Imaging   CT AP W Contrast 02/23/18  IMPRESSION: 1. Large multi-cystic lesion in the central aspect of the liver adjacent to the gallbladder fossa, with several smaller satellite lesions. These are all new compared to prior CT the chest, abdomen and pelvis 08/07/2016, concerning for intrahepatic abscesses. 2. Extensive mural thickening and inflammatory changes in the region of the cecum. This is nonspecific, and could reflect either right-sided diverticulitis, focal area of colitis, or potentially even underlying neoplasm. 3. Cholelithiasis. Gallbladder is nearly completely contracted without surrounding inflammatory changes to suggest an acute cholecystitis at this time. 4. Left-sided nephrolithiasis measuring up to 8 mm in the upper pole collecting system of left kidney. No ureteral stones or findings of urinary tract obstruction. 5. Aortic atherosclerosis.   02/27/2018 Initial Biopsy   Diagnosis 02/27/18  Liver, needle/core biopsy, Right Hepatic Lobe - ADENOCARCINOMA. SEE NOTE.   02/27/2018 Miscellaneous     03/05/2018 Procedure   Colonoscopy 03/05/18  IMPRESSION - Preparation of the colon was fair. - Non-thrombosed external hemorrhoids found on digital rectal exam. - There was significant looping of the colon. - Severe diverticulosis in the recto-sigmoid colon, in the sigmoid colon, in the descending colon, in the transverse colon, at the hepatic flexure, in  the ascending colon and in the cecum. There was no evidence of diverticular bleeding. - A single (solitary) ulcer in the cecum - highly concerning for underlying malignancy. Biopsied. Phlegmonous change is possible, though often in setting of complicated diverticulosis/diverticulitis ulceration would not be as common. - Erythematous mucosa in the transverse colon, at the hepatic flexure, in the ascending colon and in the cecum. Biopsied. - Normal mucosa in the rectum, in the recto-sigmoid colon, in the sigmoid colon and in the descending colon. Biopsied. - Non-bleeding non-thrombosed external and internal hemorrhoids. -----Negative for malignancy    03/11/2018 Initial Diagnosis   Intrahepatic cholangiocarcinoma ([place])   04/02/2018 Imaging   CT CAP W contrast 04/02/18  IMPRESSION: 1. 4 mm subpleural nodule in the periphery of the right lower lobe. This is nonspecific, but strongly favored to represent a benign subpleural lymph node. Attention on follow-up studies is recommended to exclude the possibility of metastatic disease. 2. Diffuse bronchial wall thickening with moderate centrilobular and mild paraseptal emphysema; imaging findings suggestive of underlying COPD. 3. Aortic atherosclerosis, in addition to left main and left anterior descending coronary artery disease. Assessment for potential risk factor modification, dietary therapy or pharmacologic therapy may be warranted, if clinically indicated. 4. Nonobstructive calculi in the upper pole collecting system of left kidney measuring up to 7 mm.    04/27/2018 Cancer Staging   Staging form: Intrahepatic Bile Duct, AJCC 8th Edition - Clinical: Stage IIIB (cT2, cN1, cM0) - Signed by Truitt Merle, MD on 04/27/2018   05/18/2018 -  Chemotherapy   Gemcitabine and Cisplatin every 2 weeksstarting on 05/18/2018, with 50% dose reduction   07/19/2018 Imaging   CT CAP W CONTRAST  IMPRESSION: 1. Dominant inferior liver mass is decreased  in size. Two subcentimeter clustered low-attenuation lesions in the far  inferior right liver lobe, not discretely visualized on the prior noncontrast CT study, can not exclude new small satellite hepatic metastases. 2. Porta hepatis adenopathy is mildly decreased. 3. Small right pulmonary nodules are stable. 4. No additional potential new or progressive metastatic disease. 5. Small pericardial effusion, slightly increased. 6. Cholelithiasis. 7. Moderate diffuse colonic diverticulosis. 8. Aortic Atherosclerosis (ICD10-I70.0) and Emphysema (ICD10-J43.9).    11/02/2018 Imaging   CT CAP WO contrast  IMPRESSION: 1. The dominant right hepatic lobe mass and most of the smaller right hepatic lobe lesions are stable. One of the right hepatic lobe lesions appears to have increased in size, formerly about 1.1 by 1.2 cm and currently 1.9 by 1.5 cm. 2. Two small right lower lobe pulmonary nodules are stable in size. 3. A hypodense right thyroid nodule has enlarged, currently 1.9 by 1.2 cm and formerly 1.1 by 0.7 cm. Consider thyroid ultrasound for further evaluation. 4. Other imaging findings of potential clinical significance: Aortic Atherosclerosis (ICD10-I70.0) and Emphysema (ICD10-J43.9). Coronary atherosclerosis. Mild cardiomegaly. Low-density blood pool suggests anemia. Nonobstructive left nephrolithiasis. Colonic diverticulosis.      CURRENT THERAPY:  Gemcitabine and Cisplatin every 2 weeksstartingon 05/18/2018, with dose reduction  INTERVAL HISTORY:  Mary Chapman is here for a follow up and treatment. She presents to the clinic alone. She notes she is doing stable. She notes she does not have as much energy as before. She denies having any pain or nausea or BM issues. She has been using '100mg'$  nightly Gabapentin which helps her neuropathy.  She notes she eats all the time. Her weight has been fluctuating. Her dry weight is 65Kg. I reviewed her medication list with her.   REVIEW OF  SYSTEMS:   Constitutional: Denies fevers, chills or abnormal weight loss (+) fatigue  Eyes: Denies blurriness of vision Ears, nose, mouth, throat, and face: Denies mucositis or sore throat Respiratory: Denies cough, dyspnea or wheezes Cardiovascular: Denies palpitation, chest discomfort or lower extremity swelling Gastrointestinal:  Denies nausea, heartburn or change in bowel habits Skin: Denies abnormal skin rashes Lymphatics: Denies new lymphadenopathy or easy bruising Neurological: (+) stable mild neuropathy  Behavioral/Psych: Mood is stable, no new changes  All other systems were reviewed with the patient and are negative.  MEDICAL HISTORY:  Past Medical History:  Diagnosis Date  . Diverticulitis   . Focal segmental glomerulosclerosis    ESRD on MWF HD  . Hypertension   . intrahepatic cholangio ca dx'd10/2019  . Renal insufficiency    dialysis pt MWF  . Tobacco abuse     SURGICAL HISTORY: Past Surgical History:  Procedure Laterality Date  . AV FISTULA PLACEMENT Left 03/18/2017   Procedure: Brachiocephalic ARTERIOVENOUS (AV) FISTULA CREATION left arm;  Surgeon: Elam Dutch, MD;  Location: [facility];  Service: Vascular;  Laterality: Left;  . BIOPSY  03/05/2018   Procedure: BIOPSY;  Surgeon: Rush Landmark Telford Nab., MD;  Location: [facility];  Service: Gastroenterology;;  enteroscopy bx /cytology brushing Greig Castilla bx  . COLONOSCOPY WITH PROPOFOL N/A 03/05/2018   Procedure: COLONOSCOPY WITH PROPOFOL;  Surgeon: Rush Landmark Telford Nab., MD;  Location: [facility];  Service: Gastroenterology;  Laterality: N/A;  Bx, Spot  . ENTEROSCOPY N/A 03/05/2018   Procedure: ENTEROSCOPY;  Surgeon: Rush Landmark Telford Nab., MD;  Location: [facility];  Service: Gastroenterology;  Laterality: N/A;  BX, Brushing, & Spot  . INSERTION OF DIALYSIS CATHETER N/A 03/18/2017   Procedure: INSERTION OF DIALYSIS CATHETER;  Surgeon: Elam Dutch, MD;  Location: [facility];  Service: Vascular;  Laterality:  N/A;  . IR GENERIC HISTORICAL  08/15/2016   IR US GUIDE VASC ACCESS RIGHT 08/15/2016 Arne Cleveland, MD [facility]  . IR GENERIC HISTORICAL  08/15/2016   IR FLUORO GUIDE CV LINE RIGHT 08/15/2016 Arne Cleveland, MD [facility]  . IR IMAGING GUIDED PORT INSERTION  05/06/2018  . SUBMUCOSAL INJECTION  03/05/2018   Procedure: SUBMUCOSAL INJECTION;  Surgeon: Rush Landmark Telford Nab., MD;  Location: [facility];  Service: Gastroenterology;;  spot tatoo  . TUBAL LIGATION      I have reviewed the social history and family history with the patient and they are unchanged from previous note.  ALLERGIES:  has No Known Allergies.  MEDICATIONS:  Current Outpatient Medications  Medication Sig Dispense Refill  . amLODipine (NORVASC) 10 MG tablet     . folic acid (FOLVITE) 1 MG tablet Take 1 tablet (1 mg total) by mouth daily. 30 tablet 0  . gabapentin (NEURONTIN) 100 MG capsule Take 1 capsule (100 mg total) by mouth at bedtime. 60 capsule 1  . lidocaine-prilocaine (EMLA) cream Apply to affected area once 30 g 3  . metoprolol tartrate (LOPRESSOR) 25 MG tablet Take 0.5 tablets (12.5 mg total) by mouth 2 (two) times daily. 30 tablet 0  . multivitamin (RENA-VIT) TABS tablet Take 1 tablet by mouth at bedtime. 30 tablet 3  . nicotine (NICODERM CQ - DOSED IN MG/24 HOURS) 21 mg/24hr patch Place 1 patch (21 mg total) onto the skin daily. 28 patch 0  . Nutritional Supplements (FEEDING SUPPLEMENT, NEPRO CARB STEADY,) LIQD Take 237 mLs by mouth 3 (three) times daily as needed (Supplement). 90 Can 0  . ondansetron (ZOFRAN) 8 MG tablet Take 1 tablet (8 mg total) by mouth 2 (two) times daily as needed. Start on the third day after chemotherapy. 30 tablet 1  . oxyCODONE (OXY IR/ROXICODONE) 5 MG immediate release tablet Take 1 tablet (5 mg total) by mouth every 6 (six) hours as needed for severe pain. 30 tablet 0  . pantoprazole (PROTONIX) 40 MG tablet TAKE 1 TABLET BY MOUTH TWICE A DAY 60 tablet 1  . polyethylene  glycol (MIRALAX) packet Take 17 g by mouth daily. 30 each 0  . prochlorperazine (COMPAZINE) 10 MG tablet Take 1 tablet (10 mg total) by mouth every 6 (six) hours as needed (Nausea or vomiting). 30 tablet 1  . senna-docusate (SENOKOT-S) 8.6-50 MG tablet Take 2 tablets by mouth 2 (two) times daily. 30 tablet 0  . Vitamin D, Ergocalciferol, (DRISDOL) 50000 units CAPS capsule Take 50,000 Units by mouth once a week.  5   No current facility-administered medications for this visit.    Facility-Administered Medications Ordered in Other Visits  Medication Dose Route Frequency Provider Last Rate Last Dose  . sodium chloride flush (NS) 0.9 % injection 10 mL  10 mL Intracatheter PRN Truitt Merle, MD   10 mL at 07/27/18 1745  . sodium chloride flush (NS) 0.9 % injection 10 mL  10 mL Intracatheter PRN Truitt Merle, MD   10 mL at 12/02/18 1256    PHYSICAL EXAMINATION: ECOG PERFORMANCE STATUS: 2 - Symptomatic, <50% confined to bed  Vitals:   12/02/18 0902  BP: 104/67  Pulse: 71  Resp: 18  Temp: 98 F (36.7 C)  SpO2: 100%   Filed Weights   12/02/18 0902  Weight: 140 lb 3.2 oz (63.6 kg)    GENERAL:alert, no distress and comfortable SKIN: skin color, texture, turgor are normal, no rashes or significant lesions EYES: normal, Conjunctiva are pink and  non-injected, sclera clear  NECK: supple, thyroid normal size, non-tender, without nodularity LYMPH:  no palpable lymphadenopathy in the cervical, axillary  LUNGS: clear to auscultation and percussion with normal breathing effort HEART: regular rate & rhythm and no murmurs and no lower extremity edema ABDOMEN:abdomen soft, non-tender and normal bowel sounds Musculoskeletal:no cyanosis of digits and no clubbing  NEURO: alert & oriented x 3 with fluent speech, no focal motor/sensory deficits  LABORATORY DATA:  I have reviewed the data as listed CBC Latest Ref Rng & Units 12/02/2018 11/16/2018 11/04/2018  WBC 4.0 - 10.5 K/uL 7.3 7.2 6.1  Hemoglobin 12.0 -  15.0 g/dL 9.1(L) 7.2(L) 7.8(L)  Hematocrit 36.0 - 46.0 % 28.8(L) 22.4(L) 24.5(L)  Platelets 150 - 400 K/uL 374 153 233     CMP Latest Ref Rng & Units 12/02/2018 11/16/2018 11/16/2018  Glucose 70 - 99 mg/dL 111(H) 73 91  BUN 8 - 23 mg/dL 46(H) 40(H) 40(H)  Creatinine 0.44 - 1.00 mg/dL 7.28(HH) 6.67(HH) 6.63(HH)  Sodium 135 - 145 mmol/L 140 138 137  Potassium 3.5 - 5.1 mmol/L 5.8(H) 5.9(H) 6.0(H)  Chloride 98 - 111 mmol/L 103 98 99  CO2 22 - 32 mmol/L 24 26 27   Calcium 8.9 - 10.3 mg/dL 8.2(L) 8.3(L) 7.8(L)  Total Protein 6.5 - 8.1 g/dL 7.5 - 6.8  Total Bilirubin 0.3 - 1.2 mg/dL 0.4 - 0.4  Alkaline Phos 38 - 126 U/L 134(H) - 123  AST 15 - 41 U/L 12(L) - 13(L)  ALT 0 - 44 U/L 7 - 8      RADIOGRAPHIC STUDIES: I have personally reviewed the radiological images as listed and agreed with the findings in the report. No results found.   ASSESSMENT & PLAN:  Mary Chapman is a 73 y.o. female with   1. Adenocarcinomainliver, likelyintrahepatic cholangiocarcinoma, cT2N1M0 stage IIIB, unresectable, MS-Stable -Diagnosed in 02/2018. She is not eligible for surgery due to tumor size and location.She was seen by [facility] Cloyd Stagers [facility] -Currently onfirst lineCisplatin and Gemcitabineevery 2 weekssince 12/2019with dose reduction. -She is tolerating chemotherapy moderately will, with mild fatigue, neuropathy, moderate anemia that required blood transfusion but noothersevere otherside affects.  -I previously discussed once she has disease progression we can consider FOLFIRI. I do not think she can tolerate oxaliplatin given her neuropathy.  -I reviewed her FO results which show BRCA1 mutation. She only held APC and ATM Variants of uncertain significance, no further pathogenetic mutations for target treatment.  -She is clinically stable. Labs reviewed, CBC and CMP WNL except Hg 9.1, Cr 7.28, K 5.8. Mag 1.9. Her Ca 19.9 has been stable lately. Overall adequate to proceed with Cisplatin/Gemcitabine at  same dose -Plan to scan her end of 12/2018 -F/u in 2 weeks   2. HTN  -Currently on metoprolol. -BP at 104/67 today (12/02/18), f/u with nephology and PCP  3. End Stage Renal Disease, on HD MWF -Treated with dialysis  -f/u with nephrologist Dr. Augustin Coupe -she is tolerating HD well  4. Anemia of chronic diseaseand chemo induced anemia -Due to CKD and malignancy -She received blood transfusion after first cycle chemoand will consider blood transfusion when Hg <8.Due to the outbreak ofCOVID 19, blood supply has been low lately, will holdblood transfusion until hemoglobin less than 7 -We previously discussed the role ofEPOfor anemia, if she requires frequent blood transfusion.She voiced good understanding,and agrees withEPO if she really needs it. -She was given blood transfusion on 11/22/18.  -I previously recommended her to take multivitamins -Hg at 9.1 today (12/02/18).  5.Goal  of care discussion  -The patient understands the goal of care is palliative, to prolong his life -she is full code now  6. Neuropathy  -Likely secondary to Cisplatin, which I have reduced dose  -She developed neuropathy of legs and feet, mainly at night  -Controlled on 179m nightly Gabapentin. Given her ESRD she can take up to 2014mat night and if needed 1 tab during the day.    Plan -lab reviewed and adequate forchemotherapy cisplatin and gemcitabine today at same low dose  -Lab, flush, f/u and chemo in 2 weeks     No problem-specific Assessment & Plan notes found for this encounter.   No orders of the defined types were placed in this encounter.  All questions were answered. The patient knows to call the clinic with any problems, questions or concerns. No barriers to learning was detected. I spent 20 minutes counseling the patient face to face. The total time spent in the appointment was 25 minutes and more than 50% was on counseling and review of test results     YaTruitt MerleMD  12/02/2018   I, AmJoslyn Devonam acting as scribe for YaTruitt MerleMD.   I have reviewed the above documentation for accuracy and completeness, and I agree with the above.

## 2018-11-30 ENCOUNTER — Ambulatory Visit: Payer: Self-pay | Admitting: Licensed Clinical Social Worker

## 2018-11-30 ENCOUNTER — Telehealth: Payer: Self-pay | Admitting: Licensed Clinical Social Worker

## 2018-11-30 ENCOUNTER — Encounter: Payer: Self-pay | Admitting: Licensed Clinical Social Worker

## 2018-11-30 DIAGNOSIS — C221 Intrahepatic bile duct carcinoma: Secondary | ICD-10-CM

## 2018-11-30 DIAGNOSIS — Z1379 Encounter for other screening for genetic and chromosomal anomalies: Secondary | ICD-10-CM

## 2018-11-30 NOTE — Progress Notes (Signed)
HPI:  Ms. Amstutz was previously seen in the [facility] clinic due to her [facility] testing that revealed a BRCA1 mutation in her tumor, and concerns regarding a hereditary predisposition to cancer. Please refer to our prior cancer genetics clinic note for more information regarding our discussion, assessment and recommendations, at the time. Ms. Laham recent genetic test results were disclosed to her, as were recommendations warranted by these results. These results and recommendations are discussed in more detail below.  CANCER HISTORY:  Oncology History  Intrahepatic cholangiocarcinoma ([place])  02/23/2018 Imaging   CT AP W Contrast 02/23/18  IMPRESSION: 1. Large multi-cystic lesion in the central aspect of the liver adjacent to the gallbladder fossa, with several smaller satellite lesions. These are all new compared to prior CT the chest, abdomen and pelvis 08/07/2016, concerning for intrahepatic abscesses. 2. Extensive mural thickening and inflammatory changes in the region of the cecum. This is nonspecific, and could reflect either right-sided diverticulitis, focal area of colitis, or potentially even underlying neoplasm. 3. Cholelithiasis. Gallbladder is nearly completely contracted without surrounding inflammatory changes to suggest an acute cholecystitis at this time. 4. Left-sided nephrolithiasis measuring up to 8 mm in the upper pole collecting system of left kidney. No ureteral stones or findings of urinary tract obstruction. 5. Aortic atherosclerosis.   02/27/2018 Initial Biopsy   Diagnosis 02/27/18  Liver, needle/core biopsy, Right Hepatic Lobe - ADENOCARCINOMA. SEE NOTE.   02/27/2018 Miscellaneous     03/05/2018 Procedure   Colonoscopy 03/05/18  IMPRESSION - Preparation of the colon was fair. - Non-thrombosed external hemorrhoids found on digital rectal exam. - There was significant looping of the colon. - Severe diverticulosis in the  recto-sigmoid colon, in the sigmoid colon, in the descending colon, in the transverse colon, at the hepatic flexure, in the ascending colon and in the cecum. There was no evidence of diverticular bleeding. - A single (solitary) ulcer in the cecum - highly concerning for underlying malignancy. Biopsied. Phlegmonous change is possible, though often in setting of complicated diverticulosis/diverticulitis ulceration would not be as common. - Erythematous mucosa in the transverse colon, at the hepatic flexure, in the ascending colon and in the cecum. Biopsied. - Normal mucosa in the rectum, in the recto-sigmoid colon, in the sigmoid colon and in the descending colon. Biopsied. - Non-bleeding non-thrombosed external and internal hemorrhoids. -----Negative for malignancy    03/11/2018 Initial Diagnosis   Intrahepatic cholangiocarcinoma ([place])   04/02/2018 Imaging   CT CAP W contrast 04/02/18  IMPRESSION: 1. 4 mm subpleural nodule in the periphery of the right lower lobe. This is nonspecific, but strongly favored to represent a benign subpleural lymph node. Attention on follow-up studies is recommended to exclude the possibility of metastatic disease. 2. Diffuse bronchial wall thickening with moderate centrilobular and mild paraseptal emphysema; imaging findings suggestive of underlying COPD. 3. Aortic atherosclerosis, in addition to left main and left anterior descending coronary artery disease. Assessment for potential risk factor modification, dietary therapy or pharmacologic therapy may be warranted, if clinically indicated. 4. Nonobstructive calculi in the upper pole collecting system of left kidney measuring up to 7 mm.    04/27/2018 Cancer Staging   Staging form: Intrahepatic Bile Duct, AJCC 8th Edition - Clinical: Stage IIIB (cT2, cN1, cM0) - Signed by Truitt Merle, MD on 04/27/2018   05/18/2018 -  Chemotherapy   Gemcitabine and Cisplatin every 2 weeksstarting on 05/18/2018, with  50% dose reduction   07/19/2018 Imaging   CT CAP W CONTRAST  IMPRESSION: 1.  Dominant inferior liver mass is decreased in size. Two subcentimeter clustered low-attenuation lesions in the far inferior right liver lobe, not discretely visualized on the prior noncontrast CT study, can not exclude new small satellite hepatic metastases. 2. Porta hepatis adenopathy is mildly decreased. 3. Small right pulmonary nodules are stable. 4. No additional potential new or progressive metastatic disease. 5. Small pericardial effusion, slightly increased. 6. Cholelithiasis. 7. Moderate diffuse colonic diverticulosis. 8. Aortic Atherosclerosis (ICD10-I70.0) and Emphysema (ICD10-J43.9).    11/02/2018 Imaging   CT CAP WO contrast  IMPRESSION: 1. The dominant right hepatic lobe mass and most of the smaller right hepatic lobe lesions are stable. One of the right hepatic lobe lesions appears to have increased in size, formerly about 1.1 by 1.2 cm and currently 1.9 by 1.5 cm. 2. Two small right lower lobe pulmonary nodules are stable in size. 3. A hypodense right thyroid nodule has enlarged, currently 1.9 by 1.2 cm and formerly 1.1 by 0.7 cm. Consider thyroid ultrasound for further evaluation. 4. Other imaging findings of potential clinical significance: Aortic Atherosclerosis (ICD10-I70.0) and Emphysema (ICD10-J43.9). Coronary atherosclerosis. Mild cardiomegaly. Low-density blood pool suggests anemia. Nonobstructive left nephrolithiasis. Colonic diverticulosis.     FAMILY HISTORY:  We obtained a detailed, 4-generation family history.  Significant diagnoses are listed below: Family History  Problem Relation Age of Onset   Hypertension Mother    Diabetes Mother    Hypertension Father    Diabetes Father    Hypertension Sister    Hypertension Brother     Ms. Winstanley has one son, 53, and a daughter, 77, no cancer diagnoses. She has 7 sisters and 2 brothers, no cancers.   Ms. Siragusa mother is  living at 46 with no cancer history. Patient has 5 maternal aunts, 5 maternal uncles, no known cancers in these individuals or in her maternal cousins. No known cancers for maternal grandparents, unknown ages of death.  Ms. Zuch father died at 13. Patient has 5 paternal aunts, 5 paternal uncles, no known cancers in these individuals or in paternal cousins. No known cancers in paternal grandparents, unknown ages of death.  Ms. Bufano is unaware of previous family history of genetic testing for hereditary cancer risks. There is no reported Ashkenazi Jewish ancestry. There is no known consanguinity.  GENETIC TEST RESULTS: Genetic testing reported out on 11/23/2018 through the [facility] Common Hereditary cancer panel found no pathogenic mutations.  The Common Hereditary Cancers Panel offered by [facility] includes sequencing and/or deletion duplication testing of the following 48 genes: APC, ATM, AXIN2, BARD1, BMPR1A, BRCA1, BRCA2, BRIP1, CDH1, CDKN2A (p14ARF), CDKN2A (p16INK4a), CKD4, CHEK2, CTNNA1, DICER1, EPCAM (Deletion/duplication testing only), GREM1 (promoter region deletion/duplication testing only), KIT, MEN1, MLH1, MSH2, MSH3, MSH6, MUTYH, NBN, NF1, NHTL1, PALB2, PDGFRA, PMS2, POLD1, POLE, PTEN, RAD50, RAD51C, RAD51D, RNF43, SDHB, SDHC, SDHD, SMAD4, SMARCA4. STK11, TP53, TSC1, TSC2, and VHL.  The following genes were evaluated for sequence changes only: SDHA and HOXB13 c.251G>A variant only.  The test report has been scanned into EPIC and is located under the Molecular Pathology section of the Results Review tab.  A portion of the result report is included below for reference.     We discussed with Ms. Willets that because current genetic testing is not perfect, it is possible there may be a gene mutation in one of these genes that current testing cannot detect, but that chance is small.  We also discussed, that there could be another gene that has not yet been discovered, or that we have  not yet  tested, that is responsible for the cancer diagnoses in the family. It is also possible there is a hereditary cause for the cancer in the family that Ms. Enck did not inherit and therefore was not identified in her testing.  Therefore, it is important to remain in touch with cancer genetics in the future so that we can continue to offer Ms. Arrazola the most up to date genetic testing.   Genetic testing did identify 2 Variants of uncertain significance (VUS) - one in the APC gene called c.7109G>T, a second in the ATM gene called c.3628A>G. At this time, it is unknown if these variants are associated with increased cancer risk or if they are normal findings, but most variants such as these get reclassified to being inconsequential. They should not be used to make medical management decisions. With time, we suspect the lab will determine the significance of these variants, if any. If we do learn more about them, we will try to contact Ms. Madore to discuss it further. However, it is important to stay in touch with [place] periodically and keep the address and phone number up to date.  ADDITIONAL GENETIC TESTING: We discussed with Ms. Plake that her genetic testing was fairly extensive.  If there are genes identified to increase cancer risk that can be analyzed in the future, we would be happy to discuss and coordinate this testing at that time.    CANCER SCREENING RECOMMENDATIONS: Ms. Tom test result is considered negative (normal).  This means that we have not identified a hereditary cause for her  personal  history of cancer at this time. Most cancers happen by chance and this negative test suggests that her cancer may fall into this category.    While reassuring, this does not definitively rule out a hereditary predisposition to cancer. It is still possible that there could be genetic mutations that are undetectable by current technology. There could be genetic mutations in genes that have not been tested or  identified to increase cancer risk.  Therefore, it is recommended she continue to follow the cancer management and screening guidelines provided by her oncology and primary healthcare provider.   An individual's cancer risk and medical management are not determined by genetic test results alone. Overall cancer risk assessment incorporates additional factors, including personal medical history, family history, and any available genetic information that may result in a personalized plan for cancer prevention and surveillance  RECOMMENDATIONS FOR FAMILY MEMBERS:   We recommended female relatives in this family have a yearly mammogram beginning at age 79, or 21 years younger than the earliest onset of cancer, an annual clinical breast exam, and perform monthly breast self-exams. Female relatives in this family should also have a gynecological exam as recommended by their primary provider. All family members should have a colonoscopy by age 11, or as directed by their physicians.  FOLLOW-UP: Lastly, we discussed with Ms. Edsall that cancer genetics is a rapidly advancing field and it is possible that new genetic tests will be appropriate for her and/or her family members in the future. We encouraged her to remain in contact with cancer genetics on an annual basis so we can update her personal and family histories and let her know of advances in cancer genetics that may benefit this family.   Our contact number was provided. Ms. Woolbright questions were answered to her satisfaction, and she knows she is welcome to call us at anytime with additional questions or concerns.  Faith Rogue, MS, [facility] [email or web address] Phone: [phone]

## 2018-11-30 NOTE — Telephone Encounter (Signed)
Revealed negative genetic testing. The testing did not identify a mutation in BRCA1. Revealed that a VUS in APC and ATM were identified. This normal result is reassuring and indicates that it is unlikely Mary Chapman's cancer is due to a hereditary cause.  It is unlikely that there is an increased risk of another cancer due to a mutation in one of these genes.  However, genetic testing is not perfect, and cannot definitively rule out a hereditary cause.  It will be important for her to keep in contact with genetics to learn if any additional testing may be needed in the future.

## 2018-12-02 ENCOUNTER — Encounter: Payer: Self-pay | Admitting: Hematology

## 2018-12-02 ENCOUNTER — Other Ambulatory Visit: Payer: Self-pay

## 2018-12-02 ENCOUNTER — Inpatient Hospital Stay: Payer: Medicare Other

## 2018-12-02 ENCOUNTER — Telehealth: Payer: Self-pay | Admitting: Hematology

## 2018-12-02 ENCOUNTER — Inpatient Hospital Stay (HOSPITAL_BASED_OUTPATIENT_CLINIC_OR_DEPARTMENT_OTHER): Payer: Medicare Other | Admitting: Hematology

## 2018-12-02 VITALS — BP 104/67 | HR 71 | Temp 98.0°F | Resp 18 | Ht 66.0 in | Wt 140.2 lb

## 2018-12-02 DIAGNOSIS — G629 Polyneuropathy, unspecified: Secondary | ICD-10-CM

## 2018-12-02 DIAGNOSIS — C221 Intrahepatic bile duct carcinoma: Secondary | ICD-10-CM | POA: Diagnosis not present

## 2018-12-02 DIAGNOSIS — K6389 Other specified diseases of intestine: Secondary | ICD-10-CM

## 2018-12-02 DIAGNOSIS — T451X5A Adverse effect of antineoplastic and immunosuppressive drugs, initial encounter: Secondary | ICD-10-CM

## 2018-12-02 DIAGNOSIS — D490 Neoplasm of unspecified behavior of digestive system: Secondary | ICD-10-CM

## 2018-12-02 DIAGNOSIS — D6481 Anemia due to antineoplastic chemotherapy: Secondary | ICD-10-CM

## 2018-12-02 DIAGNOSIS — Z87891 Personal history of nicotine dependence: Secondary | ICD-10-CM

## 2018-12-02 DIAGNOSIS — I10 Essential (primary) hypertension: Secondary | ICD-10-CM

## 2018-12-02 DIAGNOSIS — N186 End stage renal disease: Secondary | ICD-10-CM | POA: Diagnosis not present

## 2018-12-02 DIAGNOSIS — Z95828 Presence of other vascular implants and grafts: Secondary | ICD-10-CM

## 2018-12-02 DIAGNOSIS — Z992 Dependence on renal dialysis: Secondary | ICD-10-CM

## 2018-12-02 DIAGNOSIS — Z79899 Other long term (current) drug therapy: Secondary | ICD-10-CM

## 2018-12-02 LAB — CBC WITH DIFFERENTIAL (CANCER CENTER ONLY)
Abs Immature Granulocytes: 0.02 10*3/uL (ref 0.00–0.07)
Basophils Absolute: 0 10*3/uL (ref 0.0–0.1)
Basophils Relative: 0 %
Eosinophils Absolute: 0.8 10*3/uL — ABNORMAL HIGH (ref 0.0–0.5)
Eosinophils Relative: 10 %
HCT: 28.8 % — ABNORMAL LOW (ref 36.0–46.0)
Hemoglobin: 9.1 g/dL — ABNORMAL LOW (ref 12.0–15.0)
Immature Granulocytes: 0 %
Lymphocytes Relative: 16 %
Lymphs Abs: 1.2 10*3/uL (ref 0.7–4.0)
MCH: 34.2 pg — ABNORMAL HIGH (ref 26.0–34.0)
MCHC: 31.6 g/dL (ref 30.0–36.0)
MCV: 108.3 fL — ABNORMAL HIGH (ref 80.0–100.0)
Monocytes Absolute: 0.9 10*3/uL (ref 0.1–1.0)
Monocytes Relative: 12 %
Neutro Abs: 4.5 10*3/uL (ref 1.7–7.7)
Neutrophils Relative %: 62 %
Platelet Count: 374 10*3/uL (ref 150–400)
RBC: 2.66 MIL/uL — ABNORMAL LOW (ref 3.87–5.11)
RDW: 13.9 % (ref 11.5–15.5)
WBC Count: 7.3 10*3/uL (ref 4.0–10.5)
nRBC: 0 % (ref 0.0–0.2)

## 2018-12-02 LAB — CMP (CANCER CENTER ONLY)
ALT: 7 U/L (ref 0–44)
AST: 12 U/L — ABNORMAL LOW (ref 15–41)
Albumin: 3.2 g/dL — ABNORMAL LOW (ref 3.5–5.0)
Alkaline Phosphatase: 134 U/L — ABNORMAL HIGH (ref 38–126)
Anion gap: 13 (ref 5–15)
BUN: 46 mg/dL — ABNORMAL HIGH (ref 8–23)
CO2: 24 mmol/L (ref 22–32)
Calcium: 8.2 mg/dL — ABNORMAL LOW (ref 8.9–10.3)
Chloride: 103 mmol/L (ref 98–111)
Creatinine: 7.28 mg/dL (ref 0.44–1.00)
GFR, Est AFR Am: 6 mL/min — ABNORMAL LOW (ref >60)
GFR, Estimated: 5 mL/min — ABNORMAL LOW (ref >60)
Glucose, Bld: 111 mg/dL — ABNORMAL HIGH (ref 70–99)
Potassium: 5.8 mmol/L — ABNORMAL HIGH (ref 3.5–5.1)
Sodium: 140 mmol/L (ref 135–145)
Total Bilirubin: 0.4 mg/dL (ref 0.3–1.2)
Total Protein: 7.5 g/dL (ref 6.5–8.1)

## 2018-12-02 LAB — MAGNESIUM: Magnesium: 1.9 mg/dL (ref 1.7–2.4)

## 2018-12-02 MED ORDER — SODIUM CHLORIDE 0.9% FLUSH
10.0000 mL | INTRAVENOUS | Status: DC | PRN
  Administered 2018-12-02: 10 mL
  Filled 2018-12-02: qty 10

## 2018-12-02 MED ORDER — SODIUM CHLORIDE 0.9 % IV SOLN
Freq: Once | INTRAVENOUS | Status: AC
  Administered 2018-12-02: 10:00:00 via INTRAVENOUS
  Filled 2018-12-02: qty 5

## 2018-12-02 MED ORDER — SODIUM CHLORIDE 0.9 % IV SOLN
Freq: Once | INTRAVENOUS | Status: AC
  Administered 2018-12-02: 10:00:00 via INTRAVENOUS
  Filled 2018-12-02: qty 250

## 2018-12-02 MED ORDER — HEPARIN SOD (PORK) LOCK FLUSH 100 UNIT/ML IV SOLN
500.0000 [IU] | Freq: Once | INTRAVENOUS | Status: AC | PRN
  Administered 2018-12-02: 500 [IU]
  Filled 2018-12-02: qty 5

## 2018-12-02 MED ORDER — SODIUM CHLORIDE 0.9 % IV SOLN: 600.0000 mg/m2 | Freq: Once | INTRAVENOUS | Status: DC

## 2018-12-02 MED ORDER — SODIUM CHLORIDE 0.9 % IV SOLN
10.0000 mg/m2 | Freq: Once | INTRAVENOUS | Status: AC
  Administered 2018-12-02: 17 mg via INTRAVENOUS
  Filled 2018-12-02: qty 17

## 2018-12-02 MED ORDER — PALONOSETRON HCL INJECTION 0.25 MG/5ML
INTRAVENOUS | Status: AC
  Filled 2018-12-02: qty 5

## 2018-12-02 MED ORDER — SODIUM CHLORIDE 0.9 % IV SOLN
600.0000 mg/m2 | Freq: Once | INTRAVENOUS | Status: AC
  Administered 2018-12-02: 1026 mg via INTRAVENOUS
  Filled 2018-12-02: qty 26.98

## 2018-12-02 MED ORDER — PALONOSETRON HCL INJECTION 0.25 MG/5ML
0.2500 mg | Freq: Once | INTRAVENOUS | Status: AC
  Administered 2018-12-02: 0.25 mg via INTRAVENOUS

## 2018-12-02 NOTE — Patient Instructions (Signed)
[facility] Discharge Instructions for Patients Receiving Chemotherapy  Today you received the following chemotherapy agents Gemzar and Cisplatin  To help prevent nausea and vomiting after your treatment, we encourage you to take your nausea medication as directed   If you develop nausea and vomiting that is not controlled by your nausea medication, call the clinic.   BELOW ARE SYMPTOMS THAT SHOULD BE REPORTED IMMEDIATELY:  *FEVER GREATER THAN 100.5 F  *CHILLS WITH OR WITHOUT FEVER  NAUSEA AND VOMITING THAT IS NOT CONTROLLED WITH YOUR NAUSEA MEDICATION  *UNUSUAL SHORTNESS OF BREATH  *UNUSUAL BRUISING OR BLEEDING  TENDERNESS IN MOUTH AND THROAT WITH OR WITHOUT PRESENCE OF ULCERS  *URINARY PROBLEMS  *BOWEL PROBLEMS  UNUSUAL RASH Items with * indicate a potential emergency and should be followed up as soon as possible.  Feel free to call the clinic should you have any questions or concerns. The clinic phone number is [phone].  Please show the CHEMO ALERT CARD at check-in to the Emergency Department and triage nurse.   

## 2018-12-02 NOTE — Progress Notes (Signed)
Dr. Burr Medico made aware of creatinine of 7.28

## 2018-12-02 NOTE — Telephone Encounter (Signed)
Scheduled appt per 7/16 los. °

## 2018-12-14 ENCOUNTER — Other Ambulatory Visit: Payer: Self-pay

## 2018-12-14 ENCOUNTER — Inpatient Hospital Stay: Payer: Medicare Other

## 2018-12-14 ENCOUNTER — Telehealth: Payer: Self-pay | Admitting: Nurse Practitioner

## 2018-12-14 ENCOUNTER — Telehealth: Payer: Self-pay | Admitting: *Deleted

## 2018-12-14 ENCOUNTER — Inpatient Hospital Stay (HOSPITAL_BASED_OUTPATIENT_CLINIC_OR_DEPARTMENT_OTHER): Payer: Medicare Other | Admitting: Nurse Practitioner

## 2018-12-14 ENCOUNTER — Encounter: Payer: Self-pay | Admitting: Nurse Practitioner

## 2018-12-14 VITALS — BP 126/72 | HR 66 | Temp 98.9°F | Resp 18 | Ht 66.0 in | Wt 141.3 lb

## 2018-12-14 VITALS — BP 118/71 | HR 66

## 2018-12-14 DIAGNOSIS — Z992 Dependence on renal dialysis: Secondary | ICD-10-CM

## 2018-12-14 DIAGNOSIS — C221 Intrahepatic bile duct carcinoma: Secondary | ICD-10-CM | POA: Diagnosis not present

## 2018-12-14 DIAGNOSIS — Z87891 Personal history of nicotine dependence: Secondary | ICD-10-CM

## 2018-12-14 DIAGNOSIS — Z95828 Presence of other vascular implants and grafts: Secondary | ICD-10-CM

## 2018-12-14 DIAGNOSIS — I10 Essential (primary) hypertension: Secondary | ICD-10-CM

## 2018-12-14 DIAGNOSIS — N186 End stage renal disease: Secondary | ICD-10-CM | POA: Diagnosis not present

## 2018-12-14 DIAGNOSIS — T451X5A Adverse effect of antineoplastic and immunosuppressive drugs, initial encounter: Secondary | ICD-10-CM

## 2018-12-14 DIAGNOSIS — D6481 Anemia due to antineoplastic chemotherapy: Secondary | ICD-10-CM | POA: Diagnosis not present

## 2018-12-14 DIAGNOSIS — G629 Polyneuropathy, unspecified: Secondary | ICD-10-CM

## 2018-12-14 DIAGNOSIS — Z79899 Other long term (current) drug therapy: Secondary | ICD-10-CM

## 2018-12-14 LAB — CMP (CANCER CENTER ONLY)
ALT: 7 U/L (ref 0–44)
AST: 14 U/L — ABNORMAL LOW (ref 15–41)
Albumin: 3.3 g/dL — ABNORMAL LOW (ref 3.5–5.0)
Alkaline Phosphatase: 136 U/L — ABNORMAL HIGH (ref 38–126)
Anion gap: 14 (ref 5–15)
BUN: 45 mg/dL — ABNORMAL HIGH (ref 8–23)
CO2: 25 mmol/L (ref 22–32)
Calcium: 8.6 mg/dL — ABNORMAL LOW (ref 8.9–10.3)
Chloride: 101 mmol/L (ref 98–111)
Creatinine: 8.05 mg/dL (ref 0.44–1.00)
GFR, Est AFR Am: 5 mL/min — ABNORMAL LOW (ref >60)
GFR, Estimated: 5 mL/min — ABNORMAL LOW (ref >60)
Glucose, Bld: 92 mg/dL (ref 70–99)
Potassium: 4.9 mmol/L (ref 3.5–5.1)
Sodium: 140 mmol/L (ref 135–145)
Total Bilirubin: 0.4 mg/dL (ref 0.3–1.2)
Total Protein: 7.5 g/dL (ref 6.5–8.1)

## 2018-12-14 LAB — CBC WITH DIFFERENTIAL (CANCER CENTER ONLY)
Abs Immature Granulocytes: 0.07 10*3/uL (ref 0.00–0.07)
Basophils Absolute: 0 10*3/uL (ref 0.0–0.1)
Basophils Relative: 0 %
Eosinophils Absolute: 0.6 10*3/uL — ABNORMAL HIGH (ref 0.0–0.5)
Eosinophils Relative: 7 %
HCT: 27.1 % — ABNORMAL LOW (ref 36.0–46.0)
Hemoglobin: 8.7 g/dL — ABNORMAL LOW (ref 12.0–15.0)
Immature Granulocytes: 1 %
Lymphocytes Relative: 17 %
Lymphs Abs: 1.3 10*3/uL (ref 0.7–4.0)
MCH: 34.8 pg — ABNORMAL HIGH (ref 26.0–34.0)
MCHC: 32.1 g/dL (ref 30.0–36.0)
MCV: 108.4 fL — ABNORMAL HIGH (ref 80.0–100.0)
Monocytes Absolute: 0.9 10*3/uL (ref 0.1–1.0)
Monocytes Relative: 11 %
Neutro Abs: 5 10*3/uL (ref 1.7–7.7)
Neutrophils Relative %: 64 %
Platelet Count: 162 10*3/uL (ref 150–400)
RBC: 2.5 MIL/uL — ABNORMAL LOW (ref 3.87–5.11)
RDW: 13.6 % (ref 11.5–15.5)
WBC Count: 7.9 10*3/uL (ref 4.0–10.5)
nRBC: 0 % (ref 0.0–0.2)

## 2018-12-14 LAB — MAGNESIUM: Magnesium: 2 mg/dL (ref 1.7–2.4)

## 2018-12-14 MED ORDER — SODIUM CHLORIDE 0.9 % IV SOLN
Freq: Once | INTRAVENOUS | Status: AC
  Administered 2018-12-14: 10:00:00 via INTRAVENOUS
  Filled 2018-12-14: qty 5

## 2018-12-14 MED ORDER — SODIUM CHLORIDE 0.9 % IV SOLN
Freq: Once | INTRAVENOUS | Status: AC
  Administered 2018-12-14: 10:00:00 via INTRAVENOUS
  Filled 2018-12-14: qty 250

## 2018-12-14 MED ORDER — PALONOSETRON HCL INJECTION 0.25 MG/5ML
0.2500 mg | Freq: Once | INTRAVENOUS | Status: AC
  Administered 2018-12-14: 0.25 mg via INTRAVENOUS

## 2018-12-14 MED ORDER — SODIUM CHLORIDE 0.9 % IV SOLN
600.0000 mg/m2 | Freq: Once | INTRAVENOUS | Status: AC
  Administered 2018-12-14: 1026 mg via INTRAVENOUS
  Filled 2018-12-14: qty 26.98

## 2018-12-14 MED ORDER — HEPARIN SOD (PORK) LOCK FLUSH 100 UNIT/ML IV SOLN
500.0000 [IU] | Freq: Once | INTRAVENOUS | Status: AC | PRN
  Administered 2018-12-14: 500 [IU]
  Filled 2018-12-14: qty 5

## 2018-12-14 MED ORDER — SODIUM CHLORIDE 0.9 % IV SOLN
10.0000 mg/m2 | Freq: Once | INTRAVENOUS | Status: AC
  Administered 2018-12-14: 11:00:00 17 mg via INTRAVENOUS
  Filled 2018-12-14: qty 17

## 2018-12-14 MED ORDER — PALONOSETRON HCL INJECTION 0.25 MG/5ML
INTRAVENOUS | Status: AC
  Filled 2018-12-14: qty 5

## 2018-12-14 MED ORDER — SODIUM CHLORIDE 0.9% FLUSH
10.0000 mL | INTRAVENOUS | Status: DC | PRN
  Administered 2018-12-14: 10 mL
  Filled 2018-12-14: qty 10

## 2018-12-14 MED ORDER — SODIUM CHLORIDE 0.9% FLUSH
10.0000 mL | INTRAVENOUS | Status: DC | PRN
  Administered 2018-12-14: 08:00:00 10 mL
  Filled 2018-12-14: qty 10

## 2018-12-14 NOTE — Telephone Encounter (Signed)
Received call from [facility] in the lab with creatinine result of 8.05. Pt is a dialysis pt. Cira Rue, NP notified.

## 2018-12-14 NOTE — Telephone Encounter (Signed)
Scheduled appt per 7/28 los.

## 2018-12-14 NOTE — Progress Notes (Signed)
Verbal order from [place], NP: okay to treat patient with current Scr.

## 2018-12-14 NOTE — Patient Instructions (Signed)
[facility] Discharge Instructions for Patients Receiving Chemotherapy  Today you received the following chemotherapy agents Gemcitabine (GEMZAR) & Cisplatin (PLATINOL).  To help prevent nausea and vomiting after your treatment, we encourage you to take your nausea medication as prescribed.   If you develop nausea and vomiting that is not controlled by your nausea medication, call the clinic.   BELOW ARE SYMPTOMS THAT SHOULD BE REPORTED IMMEDIATELY:  *FEVER GREATER THAN 100.5 F  *CHILLS WITH OR WITHOUT FEVER  NAUSEA AND VOMITING THAT IS NOT CONTROLLED WITH YOUR NAUSEA MEDICATION  *UNUSUAL SHORTNESS OF BREATH  *UNUSUAL BRUISING OR BLEEDING  TENDERNESS IN MOUTH AND THROAT WITH OR WITHOUT PRESENCE OF ULCERS  *URINARY PROBLEMS  *BOWEL PROBLEMS  UNUSUAL RASH Items with * indicate a potential emergency and should be followed up as soon as possible.  Feel free to call the clinic should you have any questions or concerns. The clinic phone number is [phone].  Please show the CHEMO ALERT CARD at check-in to the Emergency Department and triage nurse.  Coronavirus (COVID-19) Are you at risk?  Are you at risk for the Coronavirus (COVID-19)?  To be considered HIGH RISK for Coronavirus (COVID-19), you have to meet the following criteria:  . Traveled to [place], [place], [place], [place] or [place]; or in the [place] to [place], [place], [place], or [place]; and have fever, cough, and shortness of breath within the last 2 weeks of travel OR . Been in close contact with a person diagnosed with COVID-19 within the last 2 weeks and have fever, cough, and shortness of breath . IF YOU DO NOT MEET THESE CRITERIA, YOU ARE CONSIDERED LOW RISK FOR COVID-19.  What to do if you are HIGH RISK for COVID-19?  . If you are having a medical emergency, call 911. . Seek medical care right away. Before you go to a doctor's office, urgent care or emergency department, call  ahead and tell them about your recent travel, contact with someone diagnosed with COVID-19, and your symptoms. You should receive instructions from your physician's office regarding next steps of care.  . When you arrive at healthcare provider, tell the healthcare staff immediately you have returned from visiting [place], [place], [place], [place] or [place]; or traveled in the [place] to [place], [place], [place], or [place]; in the last two weeks or you have been in close contact with a person diagnosed with COVID-19 in the last 2 weeks.   . Tell the health care staff about your symptoms: fever, cough and shortness of breath. . After you have been seen by a medical provider, you will be either: o Tested for (COVID-19) and discharged home on quarantine except to seek medical care if symptoms worsen, and asked to  - Stay home and avoid contact with others until you get your results (4-5 days)  - Avoid travel on public transportation if possible (such as bus, train, or airplane) or o Sent to the Emergency Department by EMS for evaluation, COVID-19 testing, and possible admission depending on your condition and test results.  What to do if you are LOW RISK for COVID-19?  Reduce your risk of any infection by using the same precautions used for avoiding the common cold or flu:  . Wash your hands often with soap and warm water for at least 20 seconds.  If soap and water are not readily available, use an alcohol-based hand sanitizer with at least 60% alcohol.  .   If coughing or sneezing, cover your mouth and nose by coughing or sneezing into the elbow areas of your shirt or coat, into a tissue or into your sleeve (not your hands). . Avoid shaking hands with others and consider head nods or verbal greetings only. . Avoid touching your eyes, nose, or mouth with unwashed hands.  . Avoid close contact with people who are sick. . Avoid places or events with large numbers of people in one location,  like concerts or sporting events. . Carefully consider travel plans you have or are making. . If you are planning any travel outside or inside the [place], visit the CDC's Travelers' Health webpage for the latest health notices. . If you have some symptoms but not all symptoms, continue to monitor at home and seek medical attention if your symptoms worsen. . If you are having a medical emergency, call 911.   ADDITIONAL HEALTHCARE OPTIONS FOR PATIENTS  [facility] / e-Visit: [email or web address]         [facility]: [phone]  [facility]: [phone]                   [facility]: [phone]    

## 2018-12-14 NOTE — Progress Notes (Signed)
[place]   [phone] [phone]   Clinic Follow up Note   Patient Care Team: Glendon Axe, MD as PCP - General (Family Medicine) [name], MD as PCP - Cardiology (Cardiology) Dwana Melena, MD as Attending Physician (Nephrology) Truitt Merle, MD as Consulting Physician (Hematology) 12/14/2018  CHIEF COMPLAINT: f/u intrahepatic cholangiocarcinoma   SUMMARY OF ONCOLOGIC HISTORY: Oncology History  Intrahepatic cholangiocarcinoma ([place])  02/23/2018 Imaging   CT AP W Contrast 02/23/18  IMPRESSION: 1. Large multi-cystic lesion in the central aspect of the liver adjacent to the gallbladder fossa, with several smaller satellite lesions. These are all new compared to prior CT the chest, abdomen and pelvis 08/07/2016, concerning for intrahepatic abscesses. 2. Extensive mural thickening and inflammatory changes in the region of the cecum. This is nonspecific, and could reflect either right-sided diverticulitis, focal area of colitis, or potentially even underlying neoplasm. 3. Cholelithiasis. Gallbladder is nearly completely contracted without surrounding inflammatory changes to suggest an acute cholecystitis at this time. 4. Left-sided nephrolithiasis measuring up to 8 mm in the upper pole collecting system of left kidney. No ureteral stones or findings of urinary tract obstruction. 5. Aortic atherosclerosis.   02/27/2018 Initial Biopsy   Diagnosis 02/27/18  Liver, needle/core biopsy, Right Hepatic Lobe - ADENOCARCINOMA. SEE NOTE.   02/27/2018 Miscellaneous     03/05/2018 Procedure   Colonoscopy 03/05/18  IMPRESSION - Preparation of the colon was fair. - Non-thrombosed external hemorrhoids found on digital rectal exam. - There was significant looping of the colon. - Severe diverticulosis in the recto-sigmoid colon, in the sigmoid colon, in the descending colon, in the transverse colon, at the hepatic flexure, in the ascending colon and  in the cecum. There was no evidence of diverticular bleeding. - A single (solitary) ulcer in the cecum - highly concerning for underlying malignancy. Biopsied. Phlegmonous change is possible, though often in setting of complicated diverticulosis/diverticulitis ulceration would not be as common. - Erythematous mucosa in the transverse colon, at the hepatic flexure, in the ascending colon and in the cecum. Biopsied. - Normal mucosa in the rectum, in the recto-sigmoid colon, in the sigmoid colon and in the descending colon. Biopsied. - Non-bleeding non-thrombosed external and internal hemorrhoids. -----Negative for malignancy    03/11/2018 Initial Diagnosis   Intrahepatic cholangiocarcinoma ([place])   04/02/2018 Imaging   CT CAP W contrast 04/02/18  IMPRESSION: 1. 4 mm subpleural nodule in the periphery of the right lower lobe. This is nonspecific, but strongly favored to represent a benign subpleural lymph node. Attention on follow-up studies is recommended to exclude the possibility of metastatic disease. 2. Diffuse bronchial wall thickening with moderate centrilobular and mild paraseptal emphysema; imaging findings suggestive of underlying COPD. 3. Aortic atherosclerosis, in addition to left main and left anterior descending coronary artery disease. Assessment for potential risk factor modification, dietary therapy or pharmacologic therapy may be warranted, if clinically indicated. 4. Nonobstructive calculi in the upper pole collecting system of left kidney measuring up to 7 mm.    04/27/2018 Cancer Staging   Staging form: Intrahepatic Bile Duct, AJCC 8th Edition - Clinical: Stage IIIB (cT2, cN1, cM0) - Signed by Truitt Merle, MD on 04/27/2018   05/18/2018 -  Chemotherapy   Gemcitabine and Cisplatin every 2 weeksstarting on 05/18/2018, with 50% dose reduction   07/19/2018 Imaging   CT CAP W CONTRAST  IMPRESSION: 1. Dominant inferior liver mass is decreased in size.  Two subcentimeter clustered low-attenuation lesions in the far inferior right liver lobe, not  discretely visualized on the prior noncontrast CT study, can not exclude new small satellite hepatic metastases. 2. Porta hepatis adenopathy is mildly decreased. 3. Small right pulmonary nodules are stable. 4. No additional potential new or progressive metastatic disease. 5. Small pericardial effusion, slightly increased. 6. Cholelithiasis. 7. Moderate diffuse colonic diverticulosis. 8. Aortic Atherosclerosis (ICD10-I70.0) and Emphysema (ICD10-J43.9).    11/02/2018 Imaging   CT CAP WO contrast  IMPRESSION: 1. The dominant right hepatic lobe mass and most of the smaller right hepatic lobe lesions are stable. One of the right hepatic lobe lesions appears to have increased in size, formerly about 1.1 by 1.2 cm and currently 1.9 by 1.5 cm. 2. Two small right lower lobe pulmonary nodules are stable in size. 3. A hypodense right thyroid nodule has enlarged, currently 1.9 by 1.2 cm and formerly 1.1 by 0.7 cm. Consider thyroid ultrasound for further evaluation. 4. Other imaging findings of potential clinical significance: Aortic Atherosclerosis (ICD10-I70.0) and Emphysema (ICD10-J43.9). Coronary atherosclerosis. Mild cardiomegaly. Low-density blood pool suggests anemia. Nonobstructive left nephrolithiasis. Colonic diverticulosis.     CURRENT THERAPY: Gemcitabine and Cisplatin every 2 weeksstartingon 05/18/2018, with dose reduction  INTERVAL HISTORY: Mary Chapman returns for f/u and treatment as scheduled. She completed cycle 15 on 12/02/18. She feels OK, has no specific complaints other than fatigue, which "borderlines on severe at times." She remains able to complete ADLs and tasks, but takes her longer. Also has noted feeling unsteady at times over the last few weeks. Denies dizziness or lightheadedness. Gabapentin has helped neuropathy. She does not use cane or walker. She lives alone, feels safe at  home. Otherwise, denies decreased appetite, mucositis, fever, chills, cough, chest pain, dyspnea, n/v/c/d, GI or GU bleeding or dysuria, or abdominal pain. Seldom uses oxycodone.    MEDICAL HISTORY:  Past Medical History:  Diagnosis Date   Diverticulitis    Focal segmental glomerulosclerosis    ESRD on MWF HD   Hypertension    intrahepatic cholangio ca dx'd10/2019   Renal insufficiency    dialysis pt MWF   Tobacco abuse     SURGICAL HISTORY: Past Surgical History:  Procedure Laterality Date   AV FISTULA PLACEMENT Left 03/18/2017   Procedure: Brachiocephalic ARTERIOVENOUS (AV) FISTULA CREATION left arm;  Surgeon: Elam Dutch, MD;  Location: [place];  Service: Vascular;  Laterality: Left;   BIOPSY  03/05/2018   Procedure: BIOPSY;  Surgeon: Irving Copas., MD;  Location: [facility] ENDOSCOPY;  Service: Gastroenterology;;  enteroscopy bx /cytology brushing Greig Castilla bx   COLONOSCOPY WITH PROPOFOL N/A 03/05/2018   Procedure: COLONOSCOPY WITH PROPOFOL;  Surgeon: Irving Copas., MD;  Location: [facility];  Service: Gastroenterology;  Laterality: N/A;  Bx, Spot   ENTEROSCOPY N/A 03/05/2018   Procedure: ENTEROSCOPY;  Surgeon: Mansouraty, Telford Nab., MD;  Location: [facility] ENDOSCOPY;  Service: Gastroenterology;  Laterality: N/A;  BX, Brushing, & Spot   INSERTION OF DIALYSIS CATHETER N/A 03/18/2017   Procedure: INSERTION OF DIALYSIS CATHETER;  Surgeon: Elam Dutch, MD;  Location: [place];  Service: Vascular;  Laterality: N/A;   IR GENERIC HISTORICAL  08/15/2016   IR US GUIDE VASC ACCESS RIGHT 08/15/2016 Arne Cleveland, MD [facility]-INTERV RAD   IR GENERIC HISTORICAL  08/15/2016   IR FLUORO GUIDE CV LINE RIGHT 08/15/2016 Arne Cleveland, MD [facility]   IR IMAGING GUIDED PORT INSERTION  05/06/2018   SUBMUCOSAL INJECTION  03/05/2018   Procedure: SUBMUCOSAL INJECTION;  Surgeon: Irving Copas., MD;  Location: [place];  Service: Gastroenterology;;  spot tatoo  TUBAL LIGATION      I have reviewed the social history and family history with the patient and they are unchanged from previous note.  ALLERGIES:  has No Known Allergies.  MEDICATIONS:  Current Outpatient Medications  Medication Sig Dispense Refill   amLODipine (NORVASC) 10 MG tablet      folic acid (FOLVITE) 1 MG tablet Take 1 tablet (1 mg total) by mouth daily. 30 tablet 0   gabapentin (NEURONTIN) 100 MG capsule Take 1 capsule (100 mg total) by mouth at bedtime. 60 capsule 1   lidocaine-prilocaine (EMLA) cream Apply to affected area once 30 g 3   metoprolol tartrate (LOPRESSOR) 25 MG tablet Take 0.5 tablets (12.5 mg total) by mouth 2 (two) times daily. 30 tablet 0   multivitamin (RENA-VIT) TABS tablet Take 1 tablet by mouth at bedtime. 30 tablet 3   nicotine (NICODERM CQ - DOSED IN MG/24 HOURS) 21 mg/24hr patch Place 1 patch (21 mg total) onto the skin daily. 28 patch 0   Nutritional Supplements (FEEDING SUPPLEMENT, NEPRO CARB STEADY,) LIQD Take 237 mLs by mouth 3 (three) times daily as needed (Supplement). 90 Can 0   ondansetron (ZOFRAN) 8 MG tablet Take 1 tablet (8 mg total) by mouth 2 (two) times daily as needed. Start on the third day after chemotherapy. 30 tablet 1   oxyCODONE (OXY IR/ROXICODONE) 5 MG immediate release tablet Take 1 tablet (5 mg total) by mouth every 6 (six) hours as needed for severe pain. 30 tablet 0   pantoprazole (PROTONIX) 40 MG tablet TAKE 1 TABLET BY MOUTH TWICE A DAY 60 tablet 1   polyethylene glycol (MIRALAX) packet Take 17 g by mouth daily. 30 each 0   prochlorperazine (COMPAZINE) 10 MG tablet Take 1 tablet (10 mg total) by mouth every 6 (six) hours as needed (Nausea or vomiting). 30 tablet 1   senna-docusate (SENOKOT-S) 8.6-50 MG tablet Take 2 tablets by mouth 2 (two) times daily. 30 tablet 0   Vitamin D, Ergocalciferol, (DRISDOL) 50000 units CAPS capsule Take 50,000 Units by mouth once a week.  5   No current facility-administered  medications for this visit.    Facility-Administered Medications Ordered in Other Visits  Medication Dose Route Frequency Provider Last Rate Last Dose   sodium chloride flush (NS) 0.9 % injection 10 mL  10 mL Intracatheter PRN Truitt Merle, MD   10 mL at 07/27/18 1745    PHYSICAL EXAMINATION: ECOG PERFORMANCE STATUS: 2 - Symptomatic, <50% confined to bed  Vitals:   12/14/18 0820  BP: 126/72  Pulse: 66  Resp: 18  Temp: 98.9 F (37.2 C)  SpO2: 100%   Filed Weights   12/14/18 0820  Weight: 141 lb 4.8 oz (64.1 kg)    GENERAL:alert, no distress and comfortable SKIN: no obvious rash  EYES:  sclera clear LUNGS: distant, mild congestion clears with cough. Normal breathing effort  HEART: regular rate & rhythm, no lower extremity edema ABDOMEN: abdomen soft, non-tender and normal bowel sounds. Mild hepatomegaly Musculoskeletal: no cyanosis of digits  NEURO: alert & oriented x 3 with fluent speech, normal gait  PAC without erythema   LABORATORY DATA:  I have reviewed the data as listed CBC Latest Ref Rng & Units 12/14/2018 12/02/2018 11/16/2018  WBC 4.0 - 10.5 K/uL 7.9 7.3 7.2  Hemoglobin 12.0 - 15.0 g/dL 8.7(L) 9.1(L) 7.2(L)  Hematocrit 36.0 - 46.0 % 27.1(L) 28.8(L) 22.4(L)  Platelets 150 - 400 K/uL 162 374 153     CMP Latest Ref Rng &  Units 12/14/2018 12/02/2018 11/16/2018  Glucose 70 - 99 mg/dL 92 111(H) 73  BUN 8 - 23 mg/dL 45(H) 46(H) 40(H)  Creatinine 0.44 - 1.00 mg/dL 8.05(HH) 7.28(HH) 6.67(HH)  Sodium 135 - 145 mmol/L 140 140 138  Potassium 3.5 - 5.1 mmol/L 4.9 5.8(H) 5.9(H)  Chloride 98 - 111 mmol/L 101 103 98  CO2 22 - 32 mmol/L _0 Calcium 8.9 - 10.3 mg/dL 8.6(L) 8.2(L) 8.3(L)  Total Protein 6.5 - 8.1 g/dL 7.5 7.5 -  Total Bilirubin 0.3 - 1.2 mg/dL 0.4 0.4 -  Alkaline Phos 38 - 126 U/L 136(H) 134(H) -  AST 15 - 41 U/L 14(L) 12(L) -  ALT 0 - 44 U/L 7 7 -      RADIOGRAPHIC STUDIES: I have personally reviewed the radiological images as listed and agreed with  the findings in the report. No results found.   ASSESSMENT & PLAN: Mary Chapman a 73 y.o.femalewith   1. Adenocarcinomainliver, likelyintrahepatic cholangiocarcinoma, cT2N1M0 stage IIIB, unresectable, MSI-Stable -Initially diagnosed in 02/2018; she was seen by Dr. Cloyd Stagers at [facility] and deemed not a surgical candidate. -She began first line systemic chemotherapy with dose-reduced gemcitabine and cisplatin on 05/18/2018. She required RBC transfusion after cycle 1and cycle 9 (hgb 7.6)but otherwise tolerated well.  -Restaging CT on 07/19/2018 was previously reviewed, showed partial response. She continues chemotherapyQ2 weeks.  -Restaging CT on 11/03/18 was previously reviewed by Dr. Burr Medico which showed stable disease in the liver, and mildly increased thyroid nodule. MD recommended to continue chemotherapy with current regimen and restage in 2-3 more months.  -Cisplatin was reduced to 10 mg/m2 with cycle 13 due to neuropathy  -Ms. Shippee appears stable today. She completed 15 cycles of chemotherapy. She continues to tolerate treatment moderately well overall, with progressive fatigue. She remains functional, with effort. I encouraged her to try to remain active as tolerated.  -Neuropathy has improved with chemo reduction and gabapentin -she denies much abdominal pain, rarely uses pain medication -Labs reviewed; CBC stable, Mg normal. CMP is stable, on HD. CA 19-9 pending today, was slightly elevated to 36 on 11/16/18. Clinically she is doing well.  -proceed with cycle 16 cisplatin and gemcitabine today, no dosage adjustment -she will return in 2 weeks for f/u and next cycle. Continue HD on MWF  2. Productive cough  -intermittent and ongoingsince beginning of this year -CT chestdating back to3/2018 and as recent as 07/2018 shows moderate emphysema. She is a former smoker.  -no sick contacts or known covid exposure.Wears mask at dialysis. -CT chest in 10/2018 reports emphysema -denies cough  today  3. HTN  -Currently on metoprololandamlodipine per Dr. Augustin Coupe -BP 126/72 today  4. End Stage Renal Disease, on HD MWF -followed bynephrologist Dr. Augustin Coupe -Cr. trending up lately  -K has been elevated intermittently, up to 6.0 on 6/14; she received a dose of lokelma  -K is normal today, 4.9  5. Anemia of chronic diseaseand chemo induced anemia -Due to CKD and malignancy -s/p RBC transfusion after cycle 1and cycle 9 (hgb 7.6 and symptomatic) -Hg 8.7 today, she is fatigued but otherwise asymptomatic. Will hold transfusion for now  6. Neuropathy -she developed neuropathy in lower legs and feet, she recently started gabapentin 100 mg qHS -neuropathy has improved  7.Goal of care discussion- treatment is palliative; full code  8. Unsteadiness  -Intermittently over last few weeks -denies dizziness or lightheadedness -she changes position slowly and uses caution -no orthostatic hypotension today -may be related to neuropathy and fatigue, will monitor  closely  -I encouraged her to use a cane at home, she lives alone. I strongly encouraged her to avoid fall  PLAN: -Labs reviewed, proceed with cycle 16 gemcitabine and cisplatin, no dosage adjustments -Continue HD on MWF -Use cane for ambulation at home -Return for lab, f/u and next cycle in 2 weeks    Orders Placed This Encounter  Procedures   CMP ([place] only)    Standing Status:   Standing    Number of Occurrences:   20    Standing Expiration Date:   12/14/2019   All questions were answered. The patient knows to call the clinic with any problems, questions or concerns. No barriers to learning was detected. I spent 20 minutes counseling the patient face to face. The total time spent in the appointment was 25 minutes and more than 50% was on counseling and review of test results     Alla Feeling, NP 12/14/18

## 2018-12-16 LAB — CANCER ANTIGEN 19-9: CA 19-9: 41 U/mL — ABNORMAL HIGH (ref 0–35)

## 2018-12-17 ENCOUNTER — Emergency Department (HOSPITAL_COMMUNITY): Payer: Medicare Other

## 2018-12-17 ENCOUNTER — Other Ambulatory Visit: Payer: Self-pay

## 2018-12-17 ENCOUNTER — Encounter (HOSPITAL_COMMUNITY): Payer: Self-pay

## 2018-12-17 ENCOUNTER — Emergency Department (HOSPITAL_COMMUNITY)
Admission: EM | Admit: 2018-12-17 | Discharge: 2018-12-17 | Disposition: A | Discharge status: Home / Self Care | Payer: Medicare Other | Attending: Emergency Medicine | Admitting: Emergency Medicine

## 2018-12-17 DIAGNOSIS — N186 End stage renal disease: Secondary | ICD-10-CM | POA: Diagnosis not present

## 2018-12-17 DIAGNOSIS — R55 Syncope and collapse: Secondary | ICD-10-CM | POA: Insufficient documentation

## 2018-12-17 DIAGNOSIS — Z992 Dependence on renal dialysis: Secondary | ICD-10-CM | POA: Diagnosis not present

## 2018-12-17 DIAGNOSIS — I120 Hypertensive chronic kidney disease with stage 5 chronic kidney disease or end stage renal disease: Secondary | ICD-10-CM | POA: Insufficient documentation

## 2018-12-17 DIAGNOSIS — Z720 Tobacco use: Secondary | ICD-10-CM | POA: Diagnosis not present

## 2018-12-17 DIAGNOSIS — R531 Weakness: Secondary | ICD-10-CM | POA: Insufficient documentation

## 2018-12-17 DIAGNOSIS — Z79899 Other long term (current) drug therapy: Secondary | ICD-10-CM | POA: Diagnosis not present

## 2018-12-17 DIAGNOSIS — Z8509 Personal history of malignant neoplasm of other digestive organs: Secondary | ICD-10-CM | POA: Diagnosis not present

## 2018-12-17 LAB — CBC WITH DIFFERENTIAL/PLATELET
Abs Immature Granulocytes: 0 10*3/uL (ref 0.00–0.07)
Basophils Absolute: 0 10*3/uL (ref 0.0–0.1)
Basophils Relative: 0 %
Eosinophils Absolute: 0.2 10*3/uL (ref 0.0–0.5)
Eosinophils Relative: 4 %
HCT: 27.1 % — ABNORMAL LOW (ref 36.0–46.0)
Hemoglobin: 8.8 g/dL — ABNORMAL LOW (ref 12.0–15.0)
Lymphocytes Relative: 17 %
Lymphs Abs: 0.8 10*3/uL (ref 0.7–4.0)
MCH: 34.2 pg — ABNORMAL HIGH (ref 26.0–34.0)
MCHC: 32.5 g/dL (ref 30.0–36.0)
MCV: 105.4 fL — ABNORMAL HIGH (ref 80.0–100.0)
Monocytes Absolute: 0.1 10*3/uL (ref 0.1–1.0)
Monocytes Relative: 2 %
Neutro Abs: 3.7 10*3/uL (ref 1.7–7.7)
Neutrophils Relative %: 77 %
Platelets: 143 10*3/uL — ABNORMAL LOW (ref 150–400)
RBC: 2.57 MIL/uL — ABNORMAL LOW (ref 3.87–5.11)
RDW: 13.6 % (ref 11.5–15.5)
WBC: 4.8 10*3/uL (ref 4.0–10.5)
nRBC: 0 % (ref 0.0–0.2)
nRBC: 0 /100 WBC

## 2018-12-17 LAB — CBG MONITORING, ED: Glucose-Capillary: 83 mg/dL (ref 70–99)

## 2018-12-17 LAB — LIPASE, BLOOD: Lipase: 49 U/L (ref 11–51)

## 2018-12-17 LAB — HEPATIC FUNCTION PANEL
ALT: 10 U/L (ref 0–44)
AST: 18 U/L (ref 15–41)
Albumin: 3.3 g/dL — ABNORMAL LOW (ref 3.5–5.0)
Alkaline Phosphatase: 112 U/L (ref 38–126)
Bilirubin, Direct: <0.1 mg/dL (ref 0.0–0.2)
Total Bilirubin: 0.7 mg/dL (ref 0.3–1.2)
Total Protein: 7 g/dL (ref 6.5–8.1)

## 2018-12-17 LAB — BASIC METABOLIC PANEL
Anion gap: 13 (ref 5–15)
BUN: 25 mg/dL — ABNORMAL HIGH (ref 8–23)
CO2: 25 mmol/L (ref 22–32)
Calcium: 8.3 mg/dL — ABNORMAL LOW (ref 8.9–10.3)
Chloride: 97 mmol/L — ABNORMAL LOW (ref 98–111)
Creatinine, Ser: 4.87 mg/dL — ABNORMAL HIGH (ref 0.44–1.00)
GFR calc Af Amer: 10 mL/min — ABNORMAL LOW (ref >60)
GFR calc non Af Amer: 8 mL/min — ABNORMAL LOW (ref >60)
Glucose, Bld: 95 mg/dL (ref 70–99)
Potassium: 3.9 mmol/L (ref 3.5–5.1)
Sodium: 135 mmol/L (ref 135–145)

## 2018-12-17 LAB — TROPONIN I (HIGH SENSITIVITY): Troponin I (High Sensitivity): 7 ng/L (ref <18)

## 2018-12-17 MED ORDER — HEPARIN SOD (PORK) LOCK FLUSH 100 UNIT/ML IV SOLN
500.0000 [IU] | Freq: Once | INTRAVENOUS | Status: AC
  Administered 2018-12-17: 500 [IU]
  Filled 2018-12-17: qty 5

## 2018-12-17 MED ORDER — ONDANSETRON HCL 4 MG/2ML IJ SOLN
4.0000 mg | Freq: Once | INTRAMUSCULAR | Status: AC
  Administered 2018-12-17: 4 mg via INTRAVENOUS
  Filled 2018-12-17: qty 2

## 2018-12-17 MED ORDER — SODIUM CHLORIDE 0.9 % IV BOLUS
250.0000 mL | Freq: Once | INTRAVENOUS | Status: AC
  Administered 2018-12-17: 250 mL via INTRAVENOUS

## 2018-12-17 NOTE — ED Notes (Signed)
Pt daughter called for pt transport home

## 2018-12-17 NOTE — ED Notes (Signed)
Patient transported to CT 

## 2018-12-17 NOTE — ED Provider Notes (Signed)
[facility] EMERGENCY DEPARTMENT Provider Note   CSN: [id number] Arrival date & time: 12/17/18  1150    History   Chief Complaint Chief Complaint  Patient presents with   Loss of Consciousness    HPI Mary Chapman is a 73 y.o. female.     The history is provided by the patient.  Loss of Consciousness Episode history:  Single Most recent episode:  Today Progression:  Resolved Chronicity:  New Context: sight of blood (patient had syncope episode after dialysis. Went outside in the heat, noticed some oozing from her dialysis site and went back in for a bandage and passed out. Bleeding now controlled. )   Witnessed: yes   Relieved by:  Bed rest Worsened by:  Nothing Associated symptoms: malaise/fatigue and weakness (generalized)   Associated symptoms: no anxiety, no chest pain, no confusion, no dizziness, no fever, no palpitations, no seizures, no shortness of breath and no vomiting   Risk factors comment:  Hx of ESRD, gb cancer   Past Medical History:  Diagnosis Date   Diverticulitis    Focal segmental glomerulosclerosis    ESRD on MWF HD   Hypertension    intrahepatic cholangio ca dx'd10/2019   Renal insufficiency    dialysis pt MWF   Tobacco abuse     Patient Active Problem List   Diagnosis Date Noted   Genetic testing 11/30/2018   Port-A-Cath in place 05/18/2018   Cough    Cholangiocarcinoma ([place])    Sepsis ([place]) 04/09/2018   Intrahepatic cholangiocarcinoma ([place]) 03/11/2018   Mass of cecum with bleeding 03/04/2018   GI bleed 03/01/2018   Gastrointestinal hemorrhage associated with anorectal source    Mass of right lobe of liver    Diverticulitis 02/23/2018   ESRD on dialysis ([place]) 02/23/2018   Hypophosphatemia 03/17/2017   Hyperphosphatemia 03/17/2017   Elevated troponin 03/16/2017   Hyperlipidemia 03/16/2017   SOB (shortness of breath) 03/16/2017   Focal segmental glomerulosclerosis 03/15/2017   Nephrotic  syndrome 08/20/2016   Essential hypertension 08/08/2016   Dyspnea 08/08/2016   Tobacco abuse    Anasarca 08/07/2016    Past Surgical History:  Procedure Laterality Date   AV FISTULA PLACEMENT Left 03/18/2017   Procedure: Brachiocephalic ARTERIOVENOUS (AV) FISTULA CREATION left arm;  Surgeon: Elam Dutch, MD;  Location: [facility];  Service: Vascular;  Laterality: Left;   BIOPSY  03/05/2018   Procedure: BIOPSY;  Surgeon: Irving Copas., MD;  Location: [facility] ENDOSCOPY;  Service: Gastroenterology;;  enteroscopy bx /cytology brushing Greig Castilla bx   COLONOSCOPY WITH PROPOFOL N/A 03/05/2018   Procedure: COLONOSCOPY WITH PROPOFOL;  Surgeon: Irving Copas., MD;  Location: [facility] ENDOSCOPY;  Service: Gastroenterology;  Laterality: N/A;  Bx, Spot   ENTEROSCOPY N/A 03/05/2018   Procedure: ENTEROSCOPY;  Surgeon: Mansouraty, Telford Nab., MD;  Location: [facility] ENDOSCOPY;  Service: Gastroenterology;  Laterality: N/A;  BX, Brushing, & Spot   INSERTION OF DIALYSIS CATHETER N/A 03/18/2017   Procedure: INSERTION OF DIALYSIS CATHETER;  Surgeon: Elam Dutch, MD;  Location: [facility];  Service: Vascular;  Laterality: N/A;   IR GENERIC HISTORICAL  08/15/2016   IR US GUIDE VASC ACCESS RIGHT 08/15/2016 Arne Cleveland, MD [facility]   IR GENERIC HISTORICAL  08/15/2016   IR FLUORO GUIDE CV LINE RIGHT 08/15/2016 Arne Cleveland, MD [facility]   IR IMAGING GUIDED PORT INSERTION  05/06/2018   SUBMUCOSAL INJECTION  03/05/2018   Procedure: SUBMUCOSAL INJECTION;  Surgeon: Irving Copas., MD;  Location: [place];  Service:  Gastroenterology;;  spot tatoo   TUBAL LIGATION       OB History   No obstetric history on file.      Home Medications    Prior to Admission medications   Medication Sig Start Date End Date Taking? Authorizing Provider  amLODipine (NORVASC) 10 MG tablet  09/15/18   [provider]  folic acid (FOLVITE) 1 MG tablet Take 1 tablet (1 mg total) by mouth  daily. 04/16/18   Kayleen Memos, DO  gabapentin (NEURONTIN) 100 MG capsule Take 1 capsule (100 mg total) by mouth at bedtime. 11/04/18   Truitt Merle, MD  lidocaine-prilocaine (EMLA) cream Apply to affected area once 05/10/18   Truitt Merle, MD  metoprolol tartrate (LOPRESSOR) 25 MG tablet Take 0.5 tablets (12.5 mg total) by mouth 2 (two) times daily. 04/16/18   Kayleen Memos, DO  multivitamin (RENA-VIT) TABS tablet Take 1 tablet by mouth at bedtime. 08/20/16   Rama, Venetia Maxon, MD  nicotine (NICODERM CQ - DOSED IN MG/24 HOURS) 21 mg/24hr patch Place 1 patch (21 mg total) onto the skin daily. 08/20/16   Rama, Venetia Maxon, MD  Nutritional Supplements (FEEDING SUPPLEMENT, NEPRO CARB STEADY,) LIQD Take 237 mLs by mouth 3 (three) times daily as needed (Supplement). 03/06/18   Ghimire, Henreitta Leber, MD  ondansetron (ZOFRAN) 8 MG tablet Take 1 tablet (8 mg total) by mouth 2 (two) times daily as needed. Start on the third day after chemotherapy. 05/10/18   Truitt Merle, MD  oxyCODONE (OXY IR/ROXICODONE) 5 MG immediate release tablet Take 1 tablet (5 mg total) by mouth every 6 (six) hours as needed for severe pain. 06/11/18   Truitt Merle, MD  pantoprazole (PROTONIX) 40 MG tablet TAKE 1 TABLET BY MOUTH TWICE A DAY 11/26/18   Truitt Merle, MD  polyethylene glycol [facility]) packet Take 17 g by mouth daily. 03/06/18   Ghimire, Henreitta Leber, MD  prochlorperazine (COMPAZINE) 10 MG tablet Take 1 tablet (10 mg total) by mouth every 6 (six) hours as needed (Nausea or vomiting). 05/10/18   Truitt Merle, MD  senna-docusate (SENOKOT-S) 8.6-50 MG tablet Take 2 tablets by mouth 2 (two) times daily. 04/16/18   Kayleen Memos, DO  Vitamin D, Ergocalciferol, (DRISDOL) 50000 units CAPS capsule Take 50,000 Units by mouth once a week. 01/16/17   [provider]    Family History Family History  Problem Relation Age of Onset   Hypertension Mother    Diabetes Mother    Hypertension Father    Diabetes Father    Hypertension Sister     Hypertension Brother     Social History Social History   Tobacco Use   Smoking status: Current Some Day Smoker    Packs/day: 0.25    Years: 20.00    Pack years: 5.00    Types: Cigarettes   Smokeless tobacco: Never Used   Tobacco comment: 3 cigs/day  Substance Use Topics   Alcohol use: No    Comment: moderate drinker for 10 years, quit in 30 years   Drug use: No     Allergies   Patient has no known allergies.   Review of Systems Review of Systems  Constitutional: Positive for malaise/fatigue. Negative for chills and fever.  HENT: Negative for ear pain and sore throat.   Eyes: Negative for pain and visual disturbance.  Respiratory: Negative for cough and shortness of breath.   Cardiovascular: Positive for syncope. Negative for chest pain and palpitations.  Gastrointestinal: Negative for abdominal pain and  vomiting.  Genitourinary: Negative for dysuria and hematuria.  Musculoskeletal: Negative for arthralgias and back pain.  Skin: Negative for color change and rash.  Neurological: Positive for weakness (generalized). Negative for dizziness, seizures and syncope.  Psychiatric/Behavioral: Negative for confusion.  All other systems reviewed and are negative.    Physical Exam Updated Vital Signs  ED Triage Vitals  Enc Vitals Group     BP 12/17/18 1214 123/64     Pulse Rate 12/17/18 1214 68     Resp 12/17/18 1214 15     Temp 12/17/18 1214 97.6 F (36.4 C)     Temp Source 12/17/18 1214 Oral     SpO2 12/17/18 1202 94 %     Weight --      Height --      Head Circumference --      Peak Flow --      Pain Score --      Pain Loc --      Pain Edu? --      Excl. in [place]? --     Physical Exam Vitals signs and nursing note reviewed.  Constitutional:      General: She is not in acute distress.    Appearance: She is well-developed. She is not ill-appearing.  HENT:     Head: Normocephalic and atraumatic.     Nose: Nose normal.     Mouth/Throat:     Mouth: Mucous  membranes are moist.  Eyes:     Extraocular Movements: Extraocular movements intact.     Conjunctiva/sclera: Conjunctivae normal.     Pupils: Pupils are equal, round, and reactive to light.  Neck:     Musculoskeletal: Neck supple.  Cardiovascular:     Rate and Rhythm: Normal rate and regular rhythm.     Pulses: Normal pulses.     Heart sounds: Normal heart sounds. No murmur.  Pulmonary:     Effort: Pulmonary effort is normal. No respiratory distress.     Breath sounds: Normal breath sounds.  Abdominal:     Palpations: Abdomen is soft.     Tenderness: There is no abdominal tenderness.  Musculoskeletal: Normal range of motion.  Skin:    General: Skin is warm and dry.     Capillary Refill: Capillary refill takes less than 2 seconds.  Neurological:     General: No focal deficit present.     Mental Status: She is alert and oriented to person, place, and time.     Cranial Nerves: No cranial nerve deficit.     Sensory: No sensory deficit.     Motor: No weakness.     Coordination: Coordination normal.  Psychiatric:        Mood and Affect: Mood normal.      ED Treatments / Results  Labs (all labs ordered are listed, but only abnormal results are displayed) Labs Reviewed  BASIC METABOLIC PANEL - Abnormal; Notable for the following components:      Result Value   Chloride 97 (*)    BUN 25 (*)    Creatinine, Ser 4.87 (*)    Calcium 8.3 (*)    GFR calc non Af Amer 8 (*)    GFR calc Af Amer 10 (*)    All other components within normal limits  CBC WITH DIFFERENTIAL/PLATELET - Abnormal; Notable for the following components:   RBC 2.57 (*)    Hemoglobin 8.8 (*)    HCT 27.1 (*)    MCV 105.4 (*)    [facility]  34.2 (*)    Platelets 143 (*)    All other components within normal limits  HEPATIC FUNCTION PANEL - Abnormal; Notable for the following components:   Albumin 3.3 (*)    All other components within normal limits  LIPASE, BLOOD  CBG MONITORING, ED  TROPONIN I (HIGH SENSITIVITY)      EKG EKG Interpretation  Date/Time:  Friday December 17 2018 11:58:02 EDT Ventricular Rate:  68 PR Interval:    QRS Duration: 88 QT Interval:  421 QTC Calculation: 448 R Axis:   53 Text Interpretation:  Sinus rhythm Consider left ventricular hypertrophy Confirmed by Lennice Sites [phone]) on 12/17/2018 12:19:46 PM   Radiology Ct Head Wo Contrast  Result Date: 12/17/2018 CLINICAL DATA:  Loss of consciousness.  Head trauma. EXAM: CT HEAD WITHOUT CONTRAST CT CERVICAL SPINE WITHOUT CONTRAST TECHNIQUE: Multidetector CT imaging of the head and cervical spine was performed following the standard protocol without intravenous contrast. Multiplanar CT image reconstructions of the cervical spine were also generated. COMPARISON:  None. FINDINGS: CT HEAD FINDINGS Brain: No evidence of acute infarction, hemorrhage, hydrocephalus, extra-axial collection or mass lesion/mass effect. Vascular: No hyperdense vessel or unexpected calcification. Skull: Normal. Negative for fracture or focal lesion. Sinuses/Orbits: Normal globes and orbits. Visualized sinuses and mastoid air cells are clear. Other: None. CT CERVICAL SPINE FINDINGS Alignment: Neck positioned in flexion.  No spondylolisthesis. Skull base and vertebrae: No acute fracture. No primary bone lesion or focal pathologic process. Soft tissues and spinal canal: No prevertebral fluid or swelling. No visible canal hematoma. Disc levels: Mild loss of disc height at C3-C4. Moderate to marked loss of disc height at C4-C5. Moderate loss of disc height at C5-C6 and C6-C7. Mild spondylotic disc bulging noted at these levels. No convincing disc herniation. Upper chest: No acute finding. 1.5 cm hypoattenuating right thyroid nodule. Emphysema noted at the lung apices. Other: None. IMPRESSION: HEAD CT 1. Normal exam. CERVICAL CT 1. No fracture or acute finding. 2. **An incidental finding of potential clinical significance has been found. 1.5 cm right thyroid lobe nodule.  Consider further evaluation with thyroid ultrasound, if this has not been previously performed. If patient is clinically hyperthyroid, consider nuclear medicine thyroid uptake and scan.** Electronically Signed   By: Lajean Manes M.D.   On: 12/17/2018 14:15   Ct Cervical Spine Wo Contrast  Result Date: 12/17/2018 CLINICAL DATA:  Loss of consciousness.  Head trauma. EXAM: CT HEAD WITHOUT CONTRAST CT CERVICAL SPINE WITHOUT CONTRAST TECHNIQUE: Multidetector CT imaging of the head and cervical spine was performed following the standard protocol without intravenous contrast. Multiplanar CT image reconstructions of the cervical spine were also generated. COMPARISON:  None. FINDINGS: CT HEAD FINDINGS Brain: No evidence of acute infarction, hemorrhage, hydrocephalus, extra-axial collection or mass lesion/mass effect. Vascular: No hyperdense vessel or unexpected calcification. Skull: Normal. Negative for fracture or focal lesion. Sinuses/Orbits: Normal globes and orbits. Visualized sinuses and mastoid air cells are clear. Other: None. CT CERVICAL SPINE FINDINGS Alignment: Neck positioned in flexion.  No spondylolisthesis. Skull base and vertebrae: No acute fracture. No primary bone lesion or focal pathologic process. Soft tissues and spinal canal: No prevertebral fluid or swelling. No visible canal hematoma. Disc levels: Mild loss of disc height at C3-C4. Moderate to marked loss of disc height at C4-C5. Moderate loss of disc height at C5-C6 and C6-C7. Mild spondylotic disc bulging noted at these levels. No convincing disc herniation. Upper chest: No acute finding. 1.5 cm hypoattenuating right thyroid nodule. Emphysema noted at the  lung apices. Other: None. IMPRESSION: HEAD CT 1. Normal exam. CERVICAL CT 1. No fracture or acute finding. 2. **An incidental finding of potential clinical significance has been found. 1.5 cm right thyroid lobe nodule. Consider further evaluation with thyroid ultrasound, if this has not been  previously performed. If patient is clinically hyperthyroid, consider nuclear medicine thyroid uptake and scan.** Electronically Signed   By: Lajean Manes M.D.   On: 12/17/2018 14:15   Dg Chest Port 1 View  Result Date: 12/17/2018 CLINICAL DATA:  Weakness.  Syncopal episode. EXAM: PORTABLE CHEST 1 VIEW COMPARISON:  CT 11/02/2018.  Chest x-ray 09/23/2018. FINDINGS: PowerPort catheter noted with tip in stable position of the SVC. Stable mild cardiomegaly. No pulmonary venous congestion. Low lung volumes. No focal alveolar infiltrate. No pleural effusion or pneumothorax. IMPRESSION: 1.  PowerPort catheter stable position. 2. Low lung volumes with mild basilar atelectasis. No focal pulmonary infiltrate noted. 3.  Stable mild cardiomegaly.  No pulmonary venous congestion. Electronically Signed   By: Marcello Moores  Register   On: 12/17/2018 12:35    Procedures Procedures (including critical care time)  Medications Ordered in ED Medications  ondansetron (ZOFRAN) injection 4 mg (4 mg Intravenous Given 12/17/18 1304)  sodium chloride 0.9 % bolus 250 mL (0 mLs Intravenous Stopped 12/17/18 1435)     Initial Impression / Assessment and Plan / ED Course  I have reviewed the triage vital signs and the nursing notes.  Pertinent labs & imaging results that were available during my care of the patient were reviewed by me and considered in my medical decision making (see chart for details).     Mary Chapman is a 73 year old female with history of hypertension, end-stage renal disease on hemodialysis, cholangiocarcinoma with ongoing treatment who presents to the ED after syncopal episode after dialysis.  Patient with normal vitals.  No fever.  Patient had completed dialysis, when outside and noticed that she is still having some mild bleeding from her dialysis site and went back in for wound dressing.  According to EMS she passed out.  Unknown if she hit her head.  Patient appears to be back at her baseline although she  is complaining of some nausea.  Patient states that she lives by herself.  She appears neurologically intact.  Has a history of anemia.  Bleeding appears to be controlled.  EKG shows sinus rhythm.  Some mild elevation anteriorly but no signs to suggest ischemia.  Patient possibly with overdiuresis as EMS states that her blood pressure was initially 80/40.  Possibly vasovagal syncope in the setting of recent dialysis.  Possibly situation syncope as she was having some bleeding.  She could be anemic, possible electrolyte abnormality.  Could have head trauma if she hit her head.  Will evaluate with CT scan of head and neck.  Basic labs.  Will give hydration back with 250 cc of fluid.  Will give Zofran for nausea.  Lab work overall unremarkable.  No significant electrolyte abnormalities.  No significant anemia.  Head CT and neck CT unremarkable.  Patient made aware of need for outpatient ultrasound of thyroid.  Troponin normal.  Lipase normal.  Doubt any acute neurological, pulmonary, cardiac event.  Overall patient feels much improved after some IV fluids and Zofran.  Recommend follow-up with primary care doctor and discharged in the ED in good condition.  Likely patient had vasovagal event/orthostatic event.  This chart was dictated using voice recognition software.  Despite best efforts to proofread,  errors can occur which can  change the documentation meaning.    Final Clinical Impressions(s) / ED Diagnoses   Final diagnoses:  Near syncope    ED Discharge Orders    None       Lennice Sites, DO 12/17/18 1458

## 2018-12-17 NOTE — ED Triage Notes (Signed)
Patient brought in by [facility] from dialysis.   Patient received full treatment, when leaving, the site would not stop bleeding.  Patient re-entered dialysis center, had a witnessed syncopal episode by staff.   On arrival to ED patient A&Ox4. Patient slow to respond, but follows all commands.   Patient skin warm and dry, NAD. Vitals signs stable.

## 2018-12-17 NOTE — ED Notes (Signed)
Patient verbalizes understanding of discharge instructions. Opportunity for questioning and answers were provided. Armband removed by staff, pt discharged from ED.  

## 2018-12-17 NOTE — Discharge Instructions (Addendum)
Continue hydration at home.  Please follow-up with your primary care doctor for thyroid ultrasound.  Today on your CT scan of your neck incidentally were found to have a thyroid nodule and they recommend outpatient ultrasound of your thyroid for further evaluation.  No emergent issue in this area.

## 2018-12-17 NOTE — ED Notes (Signed)
Portable Xray at bedside.

## 2018-12-17 NOTE — ED Notes (Signed)
Port access de-accessed by [employer]

## 2018-12-26 ENCOUNTER — Other Ambulatory Visit: Payer: Self-pay | Admitting: Hematology

## 2018-12-26 DIAGNOSIS — C221 Intrahepatic bile duct carcinoma: Secondary | ICD-10-CM

## 2018-12-30 ENCOUNTER — Inpatient Hospital Stay (HOSPITAL_BASED_OUTPATIENT_CLINIC_OR_DEPARTMENT_OTHER): Payer: Medicare Other | Admitting: Nurse Practitioner

## 2018-12-30 ENCOUNTER — Other Ambulatory Visit: Payer: Self-pay

## 2018-12-30 ENCOUNTER — Telehealth: Payer: Self-pay | Admitting: Nurse Practitioner

## 2018-12-30 ENCOUNTER — Inpatient Hospital Stay: Payer: Medicare Other

## 2018-12-30 ENCOUNTER — Inpatient Hospital Stay: Payer: Medicare Other | Attending: Hematology

## 2018-12-30 ENCOUNTER — Encounter: Payer: Self-pay | Admitting: Nurse Practitioner

## 2018-12-30 ENCOUNTER — Telehealth: Payer: Self-pay | Admitting: *Deleted

## 2018-12-30 VITALS — BP 118/76 | HR 69 | Temp 98.2°F | Resp 18 | Ht 66.0 in | Wt 141.2 lb

## 2018-12-30 DIAGNOSIS — C221 Intrahepatic bile duct carcinoma: Secondary | ICD-10-CM

## 2018-12-30 DIAGNOSIS — N186 End stage renal disease: Secondary | ICD-10-CM | POA: Insufficient documentation

## 2018-12-30 DIAGNOSIS — Z992 Dependence on renal dialysis: Secondary | ICD-10-CM | POA: Insufficient documentation

## 2018-12-30 DIAGNOSIS — J439 Emphysema, unspecified: Secondary | ICD-10-CM | POA: Diagnosis not present

## 2018-12-30 DIAGNOSIS — Z452 Encounter for adjustment and management of vascular access device: Secondary | ICD-10-CM | POA: Diagnosis not present

## 2018-12-30 DIAGNOSIS — Z5111 Encounter for antineoplastic chemotherapy: Secondary | ICD-10-CM | POA: Diagnosis present

## 2018-12-30 DIAGNOSIS — I120 Hypertensive chronic kidney disease with stage 5 chronic kidney disease or end stage renal disease: Secondary | ICD-10-CM | POA: Insufficient documentation

## 2018-12-30 DIAGNOSIS — E041 Nontoxic single thyroid nodule: Secondary | ICD-10-CM | POA: Insufficient documentation

## 2018-12-30 DIAGNOSIS — D6481 Anemia due to antineoplastic chemotherapy: Secondary | ICD-10-CM | POA: Diagnosis not present

## 2018-12-30 DIAGNOSIS — G629 Polyneuropathy, unspecified: Secondary | ICD-10-CM | POA: Insufficient documentation

## 2018-12-30 DIAGNOSIS — Z95828 Presence of other vascular implants and grafts: Secondary | ICD-10-CM

## 2018-12-30 LAB — CMP (CANCER CENTER ONLY)
ALT: 8 U/L (ref 0–44)
AST: 15 U/L (ref 15–41)
Albumin: 3.3 g/dL — ABNORMAL LOW (ref 3.5–5.0)
Alkaline Phosphatase: 120 U/L (ref 38–126)
Anion gap: 14 (ref 5–15)
BUN: 28 mg/dL — ABNORMAL HIGH (ref 8–23)
CO2: 26 mmol/L (ref 22–32)
Calcium: 8.5 mg/dL — ABNORMAL LOW (ref 8.9–10.3)
Chloride: 96 mmol/L — ABNORMAL LOW (ref 98–111)
Creatinine: 6.33 mg/dL (ref 0.44–1.00)
GFR, Est AFR Am: 7 mL/min — ABNORMAL LOW (ref >60)
GFR, Estimated: 6 mL/min — ABNORMAL LOW (ref >60)
Glucose, Bld: 93 mg/dL (ref 70–99)
Potassium: 4.5 mmol/L (ref 3.5–5.1)
Sodium: 136 mmol/L (ref 135–145)
Total Bilirubin: 0.4 mg/dL (ref 0.3–1.2)
Total Protein: 7 g/dL (ref 6.5–8.1)

## 2018-12-30 LAB — CBC WITH DIFFERENTIAL (CANCER CENTER ONLY)
Abs Immature Granulocytes: 0.03 10*3/uL (ref 0.00–0.07)
Basophils Absolute: 0 10*3/uL (ref 0.0–0.1)
Basophils Relative: 1 %
Eosinophils Absolute: 0.5 10*3/uL (ref 0.0–0.5)
Eosinophils Relative: 9 %
HCT: 25 % — ABNORMAL LOW (ref 36.0–46.0)
Hemoglobin: 8 g/dL — ABNORMAL LOW (ref 12.0–15.0)
Immature Granulocytes: 1 %
Lymphocytes Relative: 22 %
Lymphs Abs: 1.2 10*3/uL (ref 0.7–4.0)
MCH: 34.3 pg — ABNORMAL HIGH (ref 26.0–34.0)
MCHC: 32 g/dL (ref 30.0–36.0)
MCV: 107.3 fL — ABNORMAL HIGH (ref 80.0–100.0)
Monocytes Absolute: 0.9 10*3/uL (ref 0.1–1.0)
Monocytes Relative: 16 %
Neutro Abs: 3 10*3/uL (ref 1.7–7.7)
Neutrophils Relative %: 51 %
Platelet Count: 406 10*3/uL — ABNORMAL HIGH (ref 150–400)
RBC: 2.33 MIL/uL — ABNORMAL LOW (ref 3.87–5.11)
RDW: 13.8 % (ref 11.5–15.5)
WBC Count: 5.7 10*3/uL (ref 4.0–10.5)
nRBC: 0 % (ref 0.0–0.2)

## 2018-12-30 LAB — MAGNESIUM: Magnesium: 1.9 mg/dL (ref 1.7–2.4)

## 2018-12-30 MED ORDER — PALONOSETRON HCL INJECTION 0.25 MG/5ML
0.2500 mg | Freq: Once | INTRAVENOUS | Status: AC
  Administered 2018-12-30: 10:00:00 0.25 mg via INTRAVENOUS

## 2018-12-30 MED ORDER — SODIUM CHLORIDE 0.9 % IV SOLN
Freq: Once | INTRAVENOUS | Status: AC
  Administered 2018-12-30: 10:00:00 via INTRAVENOUS
  Filled 2018-12-30: qty 5

## 2018-12-30 MED ORDER — SODIUM CHLORIDE 0.9% FLUSH
10.0000 mL | INTRAVENOUS | Status: DC | PRN
  Administered 2018-12-30: 10 mL
  Filled 2018-12-30: qty 10

## 2018-12-30 MED ORDER — HEPARIN SOD (PORK) LOCK FLUSH 100 UNIT/ML IV SOLN
500.0000 [IU] | Freq: Once | INTRAVENOUS | Status: AC | PRN
  Administered 2018-12-30: 500 [IU]
  Filled 2018-12-30: qty 5

## 2018-12-30 MED ORDER — SODIUM CHLORIDE 0.9 % IV SOLN
Freq: Once | INTRAVENOUS | Status: AC
  Administered 2018-12-30: 10:00:00 via INTRAVENOUS
  Filled 2018-12-30: qty 250

## 2018-12-30 MED ORDER — SODIUM CHLORIDE 0.9% FLUSH
10.0000 mL | INTRAVENOUS | Status: DC | PRN
  Administered 2018-12-30: 13:00:00 10 mL
  Filled 2018-12-30: qty 10

## 2018-12-30 MED ORDER — SODIUM CHLORIDE 0.9 % IV SOLN
10.0000 mg/m2 | Freq: Once | INTRAVENOUS | Status: AC
  Administered 2018-12-30: 12:00:00 17 mg via INTRAVENOUS
  Filled 2018-12-30: qty 17

## 2018-12-30 MED ORDER — SODIUM CHLORIDE 0.9 % IV SOLN
600.0000 mg/m2 | Freq: Once | INTRAVENOUS | Status: AC
  Administered 2018-12-30: 11:00:00 1026 mg via INTRAVENOUS
  Filled 2018-12-30: qty 26.98

## 2018-12-30 MED ORDER — PALONOSETRON HCL INJECTION 0.25 MG/5ML
INTRAVENOUS | Status: AC
  Filled 2018-12-30: qty 5

## 2018-12-30 NOTE — Progress Notes (Signed)
Mary Chapman   Telephone:([phone] Fax:(336) [phone]   Clinic Follow up Note   Patient Care Team: Glendon Axe, MD as PCP - General (Family Medicine) [name], MD as PCP - Cardiology (Cardiology) Dwana Melena, MD as Attending Physician (Nephrology) Truitt Merle, MD as Consulting Physician (Hematology) 12/30/2018  CHIEF COMPLAINT: f/u intrahepatic cholangiocarcinoma    SUMMARY OF ONCOLOGIC HISTORY: Oncology History  Intrahepatic cholangiocarcinoma ([place])  02/23/2018 Imaging   CT AP W Contrast 02/23/18  IMPRESSION: 1. Large multi-cystic lesion in the central aspect of the liver adjacent to the gallbladder fossa, with several smaller satellite lesions. These are all new compared to prior CT the chest, abdomen and pelvis 08/07/2016, concerning for intrahepatic abscesses. 2. Extensive mural thickening and inflammatory changes in the region of the cecum. This is nonspecific, and could reflect either right-sided diverticulitis, focal area of colitis, or potentially even underlying neoplasm. 3. Cholelithiasis. Gallbladder is nearly completely contracted without surrounding inflammatory changes to suggest an acute cholecystitis at this time. 4. Left-sided nephrolithiasis measuring up to 8 mm in the upper pole collecting system of left kidney. No ureteral stones or findings of urinary tract obstruction. 5. Aortic atherosclerosis.   02/27/2018 Initial Biopsy   Diagnosis 02/27/18  Liver, needle/core biopsy, Right Hepatic Lobe - ADENOCARCINOMA. SEE NOTE.   02/27/2018 Miscellaneous     03/05/2018 Procedure   Colonoscopy 03/05/18  IMPRESSION - Preparation of the colon was fair. - Non-thrombosed external hemorrhoids found on digital rectal exam. - There was significant looping of the colon. - Severe diverticulosis in the recto-sigmoid colon, in the sigmoid colon, in the descending colon, in the transverse colon, at the hepatic flexure, in the ascending colon and  in the cecum. There was no evidence of diverticular bleeding. - A single (solitary) ulcer in the cecum - highly concerning for underlying malignancy. Biopsied. Phlegmonous change is possible, though often in setting of complicated diverticulosis/diverticulitis ulceration would not be as common. - Erythematous mucosa in the transverse colon, at the hepatic flexure, in the ascending colon and in the cecum. Biopsied. - Normal mucosa in the rectum, in the recto-sigmoid colon, in the sigmoid colon and in the descending colon. Biopsied. - Non-bleeding non-thrombosed external and internal hemorrhoids. -----Negative for malignancy    03/11/2018 Initial Diagnosis   Intrahepatic cholangiocarcinoma ([place])   04/02/2018 Imaging   CT CAP W contrast 04/02/18  IMPRESSION: 1. 4 mm subpleural nodule in the periphery of the right lower lobe. This is nonspecific, but strongly favored to represent a benign subpleural lymph node. Attention on follow-up studies is recommended to exclude the possibility of metastatic disease. 2. Diffuse bronchial wall thickening with moderate centrilobular and mild paraseptal emphysema; imaging findings suggestive of underlying COPD. 3. Aortic atherosclerosis, in addition to left main and left anterior descending coronary artery disease. Assessment for potential risk factor modification, dietary therapy or pharmacologic therapy may be warranted, if clinically indicated. 4. Nonobstructive calculi in the upper pole collecting system of left kidney measuring up to 7 mm.    04/27/2018 Cancer Staging   Staging form: Intrahepatic Bile Duct, AJCC 8th Edition - Clinical: Stage IIIB (cT2, cN1, cM0) - Signed by Truitt Merle, MD on 04/27/2018   05/18/2018 -  Chemotherapy   Gemcitabine and Cisplatin every 2 weeksstarting on 05/18/2018, with 50% dose reduction   07/19/2018 Imaging   CT CAP W CONTRAST  IMPRESSION: 1. Dominant inferior liver mass is decreased in size.  Two subcentimeter clustered low-attenuation lesions in the far inferior right liver lobe,  not discretely visualized on the prior noncontrast CT study, can not exclude new small satellite hepatic metastases. 2. Porta hepatis adenopathy is mildly decreased. 3. Small right pulmonary nodules are stable. 4. No additional potential new or progressive metastatic disease. 5. Small pericardial effusion, slightly increased. 6. Cholelithiasis. 7. Moderate diffuse colonic diverticulosis. 8. Aortic Atherosclerosis (ICD10-I70.0) and Emphysema (ICD10-J43.9).    11/02/2018 Imaging   CT CAP WO contrast  IMPRESSION: 1. The dominant right hepatic lobe mass and most of the smaller right hepatic lobe lesions are stable. One of the right hepatic lobe lesions appears to have increased in size, formerly about 1.1 by 1.2 cm and currently 1.9 by 1.5 cm. 2. Two small right lower lobe pulmonary nodules are stable in size. 3. A hypodense right thyroid nodule has enlarged, currently 1.9 by 1.2 cm and formerly 1.1 by 0.7 cm. Consider thyroid ultrasound for further evaluation. 4. Other imaging findings of potential clinical significance: Aortic Atherosclerosis (ICD10-I70.0) and Emphysema (ICD10-J43.9). Coronary atherosclerosis. Mild cardiomegaly. Low-density blood pool suggests anemia. Nonobstructive left nephrolithiasis. Colonic diverticulosis.     CURRENT THERAPY: Gemcitabine and Cisplatin every 2 weeksstartingon 05/18/2018, with dose reduction  INTERVAL HISTORY: Mary Chapman returns for follow up and treatment as scheduled. She completed cycle 16 gemcitabine and cisplatin on 12/14/2018. She had a syncopal episode after dialysis on 12/17/18 after she sight of blood from her catheter. She was transported to ED. Chest xray and head CT were unremarkable except incidental finding of thyroid nodule. She was discharged home same day. She had no recurrent syncopal or near-syncopal events. She ambulates with caution. Denies  dizziness, headaches, confusion. She has mild fatigue but remains functional. Appetite is good. Abdominal pain is better overall on chemotherapy, rarely uses oxycodone. Denies n/v/c/d. Neuropathy in her feet is improved on gabapentin. Denies fever, chills, temperature imbalance, cough, chest pain, palpitations, dyspnea, leg swelling, bleeding, mucositis, or rash.    MEDICAL HISTORY:  Past Medical History:  Diagnosis Date   Diverticulitis    Focal segmental glomerulosclerosis    ESRD on MWF HD   Hypertension    intrahepatic cholangio ca dx'd10/2019   Renal insufficiency    dialysis pt MWF   Tobacco abuse     SURGICAL HISTORY: Past Surgical History:  Procedure Laterality Date   AV FISTULA PLACEMENT Left 03/18/2017   Procedure: Brachiocephalic ARTERIOVENOUS (AV) FISTULA CREATION left arm;  Surgeon: Elam Dutch, MD;  Location: [place];  Service: Vascular;  Laterality: Left;   BIOPSY  03/05/2018   Procedure: BIOPSY;  Surgeon: Irving Copas., MD;  Location: [facility] ENDOSCOPY;  Service: Gastroenterology;;  enteroscopy bx /cytology brushing Greig Castilla bx   COLONOSCOPY WITH PROPOFOL N/A 03/05/2018   Procedure: COLONOSCOPY WITH PROPOFOL;  Surgeon: Irving Copas., MD;  Location: [place];  Service: Gastroenterology;  Laterality: N/A;  Bx, Spot   ENTEROSCOPY N/A 03/05/2018   Procedure: ENTEROSCOPY;  Surgeon: Mansouraty, Telford Nab., MD;  Location[facility] ENDOSCOPY;  Service: Gastroenterology;  Laterality: N/A;  BX, Brushing, & Spot   INSERTION OF DIALYSIS CATHETER N/A 03/18/2017   Procedure: INSERTION OF DIALYSIS CATHETER;  Surgeon: Elam Dutch, MD;  Location: [place];  Service: Vascular;  Laterality: N/A;   IR GENERIC HISTORICAL  08/15/2016   IR US GUIDE VASC ACCESS RIGHT 08/15/2016 Arne Cleveland, MD [facility]INTER[facility]   IR GENERIC HISTORICAL  08/15/2016   IR FLUORO GUIDE CV LINE RIGHT 08/15/2016 Arne Cleveland, MD [facility]   IR IMAGING GUIDED PORT INSERTION   05/06/2018   SUBMUCOSAL INJECTION  03/05/2018   Procedure: SUBMUCOSAL INJECTION;  Surgeon: Rush Landmark Telford Nab., MD;  Location: [place];  Service: Gastroenterology;;  spot tatoo   TUBAL LIGATION      I have reviewed the social history and family history with the patient and they are unchanged from previous note.  ALLERGIES:  has No Known Allergies.  MEDICATIONS:  Current Outpatient Medications  Medication Sig Dispense Refill   amLODipine (NORVASC) 10 MG tablet      gabapentin (NEURONTIN) 100 MG capsule TAKE 1 CAPSULE (100 MG TOTAL) BY MOUTH AT BEDTIME. 90 capsule 2   lidocaine-prilocaine (EMLA) cream Apply to affected area once 30 g 3   metoprolol tartrate (LOPRESSOR) 25 MG tablet Take 0.5 tablets (12.5 mg total) by mouth 2 (two) times daily. 30 tablet 0   multivitamin (RENA-VIT) TABS tablet Take 1 tablet by mouth at bedtime. 30 tablet 3   Nutritional Supplements (FEEDING SUPPLEMENT, NEPRO CARB STEADY,) LIQD Take 237 mLs by mouth 3 (three) times daily as needed (Supplement). 90 Can 0   ondansetron (ZOFRAN) 8 MG tablet Take 1 tablet (8 mg total) by mouth 2 (two) times daily as needed. Start on the third day after chemotherapy. 30 tablet 1   oxyCODONE (OXY IR/ROXICODONE) 5 MG immediate release tablet Take 1 tablet (5 mg total) by mouth every 6 (six) hours as needed for severe pain. 30 tablet 0   pantoprazole (PROTONIX) 40 MG tablet TAKE 1 TABLET BY MOUTH TWICE A DAY 60 tablet 1   polyethylene glycol (MIRALAX) packet Take 17 g by mouth daily. 30 each 0   prochlorperazine (COMPAZINE) 10 MG tablet Take 1 tablet (10 mg total) by mouth every 6 (six) hours as needed (Nausea or vomiting). 30 tablet 1   senna-docusate (SENOKOT-S) 8.6-50 MG tablet Take 2 tablets by mouth 2 (two) times daily. 30 tablet 0   Vitamin D, Ergocalciferol, (DRISDOL) 50000 units CAPS capsule Take 50,000 Units by mouth once a week.  5   folic acid (FOLVITE) 1 MG tablet Take 1 tablet (1 mg total) by mouth  daily. 30 tablet 0   nicotine (NICODERM CQ - DOSED IN MG/24 HOURS) 21 mg/24hr patch Place 1 patch (21 mg total) onto the skin daily. 28 patch 0   No current facility-administered medications for this visit.    Facility-Administered Medications Ordered in Other Visits  Medication Dose Route Frequency Provider Last Rate Last Dose   CISplatin (PLATINOL) 17 mg in sodium chloride 0.9 % 250 mL chemo infusion  10 mg/m2 (Treatment Plan Recorded) Intravenous Once Truitt Merle, MD       fosaprepitant (EMEND) 150 mg, dexamethasone (DECADRON) 12 mg in sodium chloride 0.9 % 145 mL IVPB   Intravenous Once Truitt Merle, MD       gemcitabine (GEMZAR) 1,026 mg in sodium chloride 0.9 % 250 mL chemo infusion  600 mg/m2 (Treatment Plan Recorded) Intravenous Once Truitt Merle, MD       heparin lock flush 100 unit/mL  500 Units Intracatheter Once PRN Truitt Merle, MD       sodium chloride flush (NS) 0.9 % injection 10 mL  10 mL Intracatheter PRN Truitt Merle, MD   10 mL at 07/27/18 1745   sodium chloride flush (NS) 0.9 % injection 10 mL  10 mL Intracatheter PRN Truitt Merle, MD        PHYSICAL EXAMINATION: ECOG PERFORMANCE STATUS: 1 - Symptomatic but completely ambulatory  Vitals:   12/30/18 0905  BP: 118/76  Pulse: 69  Resp: 18  Temp: 98.2  F (36.8 C)  SpO2: 100%   Filed Weights   12/30/18 0905  Weight: 141 lb 3.2 oz (64 kg)    GENERAL:alert, no distress and comfortable SKIN: no rash EYES:  sclera clear NECK: no palpable thyroid nodule LYMPH:  no palpable cervical or supraclavicular lymphadenopathy LUNGS: clear to auscultation with normal breathing effort HEART: regular rate & rhythm; no lower extremity edema ABDOMEN:abdomen soft, non-tender and normal bowel sounds Musculoskeletal:no cyanosis of digits  NEURO: alert & oriented x 3 with fluent speech, normal gait PAC without erythema   LABORATORY DATA:  I have reviewed the data as listed CBC Latest Ref Rng & Units 12/30/2018 12/17/2018 12/14/2018  WBC 4.0  - 10.5 K/uL 5.7 4.8 7.9  Hemoglobin 12.0 - 15.0 g/dL 8.0(L) 8.8(L) 8.7(L)  Hematocrit 36.0 - 46.0 % 25.0(L) 27.1(L) 27.1(L)  Platelets 150 - 400 K/uL 406(H) 143(L) 162     CMP Latest Ref Rng & Units 12/30/2018 12/17/2018 12/14/2018  Glucose 70 - 99 mg/dL 93 95 92  BUN 8 - 23 mg/dL 28(H) 25(H) 45(H)  Creatinine 0.44 - 1.00 mg/dL 6.33(HH) 4.87(H) 8.05(HH)  Sodium 135 - 145 mmol/L 136 135 140  Potassium 3.5 - 5.1 mmol/L 4.5 3.9 4.9  Chloride 98 - 111 mmol/L 96(L) 97(L) 101  CO2 22 - 32 mmol/L _0 Calcium 8.9 - 10.3 mg/dL 8.5(L) 8.3(L) 8.6(L)  Total Protein 6.5 - 8.1 g/dL 7.0 7.0 7.5  Total Bilirubin 0.3 - 1.2 mg/dL 0.4 0.7 0.4  Alkaline Phos 38 - 126 U/L 120 112 136(H)  AST 15 - 41 U/L 15 18 14(L)  ALT 0 - 44 U/L _1 RADIOGRAPHIC STUDIES: I have personally reviewed the radiological images as listed and agreed with the findings in the report. No results found.   ASSESSMENT & PLAN: Mary Chapman a 73 y.o.femalewith   1. Adenocarcinomainliver, likelyintrahepatic cholangiocarcinoma, cT2N1M0 stage IIIB, unresectable, MSI-Stable -Initially diagnosed in 02/2018; she was seen by Dr. Cloyd Stagers at [facility] and deemed not a surgical candidate. -She began first line systemic chemotherapy with dose-reduced gemcitabine and cisplatin on 05/18/2018. She required RBC transfusion after cycle 1and cycle 9 (hgb 7.6)but otherwise tolerated well.  -Restaging CT on 07/19/2018 was previously reviewed, showed partial response. She continues chemotherapyQ2 weeks. -Restaging CT on 11/03/18 was previously reviewed by Dr. Burr Medico which showed stable disease in the liver, and mildly increased thyroid nodule. MD recommended to continue chemotherapy with current regimen and restage in 2-3 more months.  -Cisplatin was reduced to 10 mg/m2 with cycle 13 due to neuropathy  -Ms. Dettmann appears stable today. She completed cycle 16 gemcitabine and cisplatin. She continues to tolerate treatment well, with mild  fatigue. She remains functional and independent  -Clinically her abdominal pain improves on chemotherapy. She does not require much oxycodone.  -Head CT done after syncopal event on 7/31 noted 1.5 cm thyroid nodule. Exam is benign. Will obtain thyroid US to further evaluate, patient agrees -Labs reviewed, Hg 8.0, plt 406. CMP stable. LFTs normal lately. Mg normal. Proceed with cycle 17 gemcitabine and cisplatin today -she will return in 2 weeks for f/u and next cycle. Continue HD on MWF  2. Productive cough  -intermittent and ongoingsince beginning of this year -CT chestdating back to3/2018 and as recent as 07/2018 shows moderate emphysema. She is a former smoker.  -no sick contacts or known covid exposure.Wears mask at dialysis. -CT chest in 10/2018 reports emphysema -denies cough today  3. HTN  -Currently on  metoprololandamlodipine per Dr. Augustin Coupe -BP has been low range normal lately, 118/76 today -she takes metoprolol BID and amlodipine in the evening. I reviewed her BP has been in normal range and may not require as much medication anymore. She will discuss with Dr. Augustin Coupe. BP at home in 130's per patient. I encouraged her to check if she is dizzy and hold BP med. She agrees.  4. End Stage Renal Disease, on HD MWF -followed bynephrologist Dr. Augustin Coupe -K has been elevated intermittently, up to 6.0 on 6/14; she received a dose of lokelma  -K normal lately, 4.5 today  5. Anemia of chronic diseaseand chemo induced anemia -Due to CKD and malignancy -s/p RBC transfusion after cycle 1and cycle 9 (hgb 7.6 and symptomatic) -Hg 8.0; asymptomatic other than mild fatigue. No blood transfusion  6. Neuropathy -she developed neuropathy in lower legs and feet, she recently started gabapentin 100 mg qHS -improved on gabapentin  7.Goal of care discussion- treatment is palliative; full code  8. Unsteadiness, syncopal event after sight of blood at dialysis on 7/31 -Intermittently over  last few weeks; denies dizziness or lightheadedness -she changes position slowly and uses caution -no orthostatic hypotension on 12/14/18 -I encouraged her to use a cane at home, she lives alone. I strongly encouraged her to avoid fall -will check BP prior to her discharge today. She is asymptomatic   PLAN: -Labs reviewed -Proceed with cycle 17 gemcitabine and cisplatin today, without dosage adjustment -Thyroid US  -F/u and next cycle in 2 weeks -Repeat BP before discharge today -discuss BP meds with Dr. Augustin Coupe, hold if dizzy or BP low at home  Orders Placed This Encounter  Procedures   US THYROID    Standing Status:   Future    Standing Expiration Date:   02/29/2020    Order Specific Question:   Reason for Exam (SYMPTOM  OR DIAGNOSIS REQUIRED)    Answer:   1.5 cm right thyroid nodule noted on CT    Order Specific Question:   Preferred imaging location?    Answer:   [facility]   All questions were answered. The patient knows to call the clinic with any problems, questions or concerns. No barriers to learning was detected. I spent 20 minutes counseling the patient face to face. The total time spent in the appointment was 25 minutes and more than 50% was on counseling and review of test results     Alla Feeling, NP 12/30/18

## 2018-12-30 NOTE — Telephone Encounter (Signed)
Received call report from [place].  "Today's Creat = 6.33."  Spoke with collaborative nurse with results.

## 2018-12-30 NOTE — Patient Instructions (Signed)
[facility] Discharge Instructions for Patients Receiving Chemotherapy  Today you received the following chemotherapy agents Gemcitabine (GEMZAR) & Cisplatin (PLATINOL).  To help prevent nausea and vomiting after your treatment, we encourage you to take your nausea medication as prescribed.   If you develop nausea and vomiting that is not controlled by your nausea medication, call the clinic.   BELOW ARE SYMPTOMS THAT SHOULD BE REPORTED IMMEDIATELY:  *FEVER GREATER THAN 100.5 F  *CHILLS WITH OR WITHOUT FEVER  NAUSEA AND VOMITING THAT IS NOT CONTROLLED WITH YOUR NAUSEA MEDICATION  *UNUSUAL SHORTNESS OF BREATH  *UNUSUAL BRUISING OR BLEEDING  TENDERNESS IN MOUTH AND THROAT WITH OR WITHOUT PRESENCE OF ULCERS  *URINARY PROBLEMS  *BOWEL PROBLEMS  UNUSUAL RASH Items with * indicate a potential emergency and should be followed up as soon as possible.  Feel free to call the clinic should you have any questions or concerns. The clinic phone number is [phone].  Please show the CHEMO ALERT CARD at check-in to the Emergency Department and triage nurse.  Coronavirus (COVID-19) Are you at risk?  Are you at risk for the Coronavirus (COVID-19)?  To be considered HIGH RISK for Coronavirus (COVID-19), you have to meet the following criteria:  . Traveled to [place], [place], [place], [place] or [place]; or in the [place] to [place], [place], [place], or [place]; and have fever, cough, and shortness of breath within the last 2 weeks of travel OR . Been in close contact with a person diagnosed with COVID-19 within the last 2 weeks and have fever, cough, and shortness of breath . IF YOU DO NOT MEET THESE CRITERIA, YOU ARE CONSIDERED LOW RISK FOR COVID-19.  What to do if you are HIGH RISK for COVID-19?  . If you are having a medical emergency, call 911. . Seek medical care right away. Before you go to a doctor's office, urgent care or emergency department, call  ahead and tell them about your recent travel, contact with someone diagnosed with COVID-19, and your symptoms. You should receive instructions from your physician's office regarding next steps of care.  . When you arrive at healthcare provider, tell the healthcare staff immediately you have returned from visiting [place], [place], [place], [place] or [place]; or traveled in the [place] to [place], [place], [place], or [place]; in the last two weeks or you have been in close contact with a person diagnosed with COVID-19 in the last 2 weeks.   . Tell the health care staff about your symptoms: fever, cough and shortness of breath. . After you have been seen by a medical provider, you will be either: o Tested for (COVID-19) and discharged home on quarantine except to seek medical care if symptoms worsen, and asked to  - Stay home and avoid contact with others until you get your results (4-5 days)  - Avoid travel on public transportation if possible (such as bus, train, or airplane) or o Sent to the Emergency Department by EMS for evaluation, COVID-19 testing, and possible admission depending on your condition and test results.  What to do if you are LOW RISK for COVID-19?  Reduce your risk of any infection by using the same precautions used for avoiding the common cold or flu:  . Wash your hands often with soap and warm water for at least 20 seconds.  If soap and water are not readily available, use an alcohol-based hand sanitizer with at least 60% alcohol.  .   If coughing or sneezing, cover your mouth and nose by coughing or sneezing into the elbow areas of your shirt or coat, into a tissue or into your sleeve (not your hands). . Avoid shaking hands with others and consider head nods or verbal greetings only. . Avoid touching your eyes, nose, or mouth with unwashed hands.  . Avoid close contact with people who are sick. . Avoid places or events with large numbers of people in one location,  like concerts or sporting events. . Carefully consider travel plans you have or are making. . If you are planning any travel outside or inside the [place], visit the CDC's Travelers' Health webpage for the latest health notices. . If you have some symptoms but not all symptoms, continue to monitor at home and seek medical attention if your symptoms worsen. . If you are having a medical emergency, call 911.   ADDITIONAL HEALTHCARE OPTIONS FOR PATIENTS  [facility] / e-Visit: [email or web address]         [facility]: [phone]  [facility]: [phone]                   [facility]: [phone]    

## 2018-12-30 NOTE — Telephone Encounter (Signed)
Scheduled appt per 8/13 los.

## 2019-01-11 ENCOUNTER — Other Ambulatory Visit: Payer: Self-pay

## 2019-01-11 ENCOUNTER — Ambulatory Visit (HOSPITAL_COMMUNITY)
Admission: RE | Admit: 2019-01-11 | Discharge: 2019-01-11 | Disposition: A | Discharge status: Home / Self Care | Payer: Medicare Other | Source: Ambulatory Visit | Attending: Nurse Practitioner | Admitting: Nurse Practitioner

## 2019-01-11 DIAGNOSIS — E041 Nontoxic single thyroid nodule: Secondary | ICD-10-CM

## 2019-01-12 ENCOUNTER — Other Ambulatory Visit: Payer: Self-pay | Admitting: Hematology

## 2019-01-12 NOTE — Progress Notes (Signed)
[facility]   [phone] Fax:([phone]) [phone]   Clinic Follow up Note   Patient Care Team: Glendon Axe, MD as PCP - General (Family Medicine) [name], MD as PCP - Cardiology (Cardiology) Dwana Melena, MD as Attending Physician (Nephrology) Truitt Merle, MD as Consulting Physician (Hematology)  Date of Service:  01/13/2019  CHIEF COMPLAINT: f/u intrahepatic cholangiocarcinoma   SUMMARY OF ONCOLOGIC HISTORY: Oncology History  Intrahepatic cholangiocarcinoma ([place])  02/23/2018 Imaging   CT AP W Contrast 02/23/18  IMPRESSION: 1. Large multi-cystic lesion in the central aspect of the liver adjacent to the gallbladder fossa, with several smaller satellite lesions. These are all new compared to prior CT the chest, abdomen and pelvis 08/07/2016, concerning for intrahepatic abscesses. 2. Extensive mural thickening and inflammatory changes in the region of the cecum. This is nonspecific, and could reflect either right-sided diverticulitis, focal area of colitis, or potentially even underlying neoplasm. 3. Cholelithiasis. Gallbladder is nearly completely contracted without surrounding inflammatory changes to suggest an acute cholecystitis at this time. 4. Left-sided nephrolithiasis measuring up to 8 mm in the upper pole collecting system of left kidney. No ureteral stones or findings of urinary tract obstruction. 5. Aortic atherosclerosis.   02/27/2018 Initial Biopsy   Diagnosis 02/27/18  Liver, needle/core biopsy, Right Hepatic Lobe - ADENOCARCINOMA. SEE NOTE.   02/27/2018 Miscellaneous     03/05/2018 Procedure   Colonoscopy 03/05/18  IMPRESSION - Preparation of the colon was fair. - Non-thrombosed external hemorrhoids found on digital rectal exam. - There was significant looping of the colon. - Severe diverticulosis in the recto-sigmoid colon, in the sigmoid colon, in the descending colon, in the transverse colon, at the hepatic flexure, in the  ascending colon and in the cecum. There was no evidence of diverticular bleeding. - A single (solitary) ulcer in the cecum - highly concerning for underlying malignancy. Biopsied. Phlegmonous change is possible, though often in setting of complicated diverticulosis/diverticulitis ulceration would not be as common. - Erythematous mucosa in the transverse colon, at the hepatic flexure, in the ascending colon and in the cecum. Biopsied. - Normal mucosa in the rectum, in the recto-sigmoid colon, in the sigmoid colon and in the descending colon. Biopsied. - Non-bleeding non-thrombosed external and internal hemorrhoids. -----Negative for malignancy    03/11/2018 Initial Diagnosis   Intrahepatic cholangiocarcinoma ([place])   04/02/2018 Imaging   CT CAP W contrast 04/02/18  IMPRESSION: 1. 4 mm subpleural nodule in the periphery of the right lower lobe. This is nonspecific, but strongly favored to represent a benign subpleural lymph node. Attention on follow-up studies is recommended to exclude the possibility of metastatic disease. 2. Diffuse bronchial wall thickening with moderate centrilobular and mild paraseptal emphysema; imaging findings suggestive of underlying COPD. 3. Aortic atherosclerosis, in addition to left main and left anterior descending coronary artery disease. Assessment for potential risk factor modification, dietary therapy or pharmacologic therapy may be warranted, if clinically indicated. 4. Nonobstructive calculi in the upper pole collecting system of left kidney measuring up to 7 mm.    04/27/2018 Cancer Staging   Staging form: Intrahepatic Bile Duct, AJCC 8th Edition - Clinical: Stage IIIB (cT2, cN1, cM0) - Signed by Truitt Merle, MD on 04/27/2018   05/18/2018 -  Chemotherapy   Gemcitabine and Cisplatin every 2 weeksstarting on 05/18/2018, with 50% dose reduction   07/19/2018 Imaging   CT CAP W CONTRAST  IMPRESSION: 1. Dominant inferior liver mass is decreased in  size. Two subcentimeter clustered low-attenuation lesions in the far  inferior right liver lobe, not discretely visualized on the prior noncontrast CT study, can not exclude new small satellite hepatic metastases. 2. Porta hepatis adenopathy is mildly decreased. 3. Small right pulmonary nodules are stable. 4. No additional potential new or progressive metastatic disease. 5. Small pericardial effusion, slightly increased. 6. Cholelithiasis. 7. Moderate diffuse colonic diverticulosis. 8. Aortic Atherosclerosis (ICD10-I70.0) and Emphysema (ICD10-J43.9).    11/02/2018 Imaging   CT CAP WO contrast  IMPRESSION: 1. The dominant right hepatic lobe mass and most of the smaller right hepatic lobe lesions are stable. One of the right hepatic lobe lesions appears to have increased in size, formerly about 1.1 by 1.2 cm and currently 1.9 by 1.5 cm. 2. Two small right lower lobe pulmonary nodules are stable in size. 3. A hypodense right thyroid nodule has enlarged, currently 1.9 by 1.2 cm and formerly 1.1 by 0.7 cm. Consider thyroid ultrasound for further evaluation. 4. Other imaging findings of potential clinical significance: Aortic Atherosclerosis (ICD10-I70.0) and Emphysema (ICD10-J43.9). Coronary atherosclerosis. Mild cardiomegaly. Low-density blood pool suggests anemia. Nonobstructive left nephrolithiasis. Colonic diverticulosis.      CURRENT THERAPY:  Gemcitabine and Cisplatin every 2 weeksstartingon 05/18/2018, with dose reduction  INTERVAL HISTORY:  Mary Chapman is here for a follow up and treatment. She presents to the clinic alone.  She states that she is doing well, no significant pain, energy level and appetite are decent, she is able to function well at home.  She did have episode of syncope after dialysis on December 17, 2018, was evaluated in the ED and discharged.  No other new complaints.   REVIEW OF SYSTEMS:   Constitutional: Denies fevers, chills or abnormal weight loss, (+)  mild fatigue  Eyes: Denies blurriness of vision Ears, nose, mouth, throat, and face: Denies mucositis or sore throat Respiratory: Denies cough, dyspnea or wheezes Cardiovascular: Denies palpitation, chest discomfort or lower extremity swelling Gastrointestinal:  Denies nausea, heartburn or change in bowel habits Skin: Denies abnormal skin rashes Lymphatics: Denies new lymphadenopathy or easy bruising Neurological:Denies numbness, tingling or new weaknesses Behavioral/Psych: Mood is stable, no new changes  All other systems were reviewed with the patient and are negative.  MEDICAL HISTORY:  Past Medical History:  Diagnosis Date  . Diverticulitis   . Focal segmental glomerulosclerosis    ESRD on MWF HD  . Hypertension   . intrahepatic cholangio ca dx'd10/2019  . Renal insufficiency    dialysis pt MWF  . Tobacco abuse     SURGICAL HISTORY: Past Surgical History:  Procedure Laterality Date  . AV FISTULA PLACEMENT Left 03/18/2017   Procedure: Brachiocephalic ARTERIOVENOUS (AV) FISTULA CREATION left arm;  Surgeon: Elam Dutch, MD;  Location: [place];  Service: Vascular;  Laterality: Left;  . BIOPSY  03/05/2018   Procedure: BIOPSY;  Surgeon: Rush Landmark Telford Nab., MD;  Location: [place];  Service: Gastroenterology;;  enteroscopy bx /cytology brushing Greig Castilla bx  . COLONOSCOPY WITH PROPOFOL N/A 03/05/2018   Procedure: COLONOSCOPY WITH PROPOFOL;  Surgeon: Rush Landmark Telford Nab., MD;  Location: [place];  Service: Gastroenterology;  Laterality: N/A;  Bx, Spot  . ENTEROSCOPY N/A 03/05/2018   Procedure: ENTEROSCOPY;  Surgeon: Rush Landmark Telford Nab., MD;  Location: [place];  Service: Gastroenterology;  Laterality: N/A;  BX, Brushing, & Spot  . INSERTION OF DIALYSIS CATHETER N/A 03/18/2017   Procedure: INSERTION OF DIALYSIS CATHETER;  Surgeon: Elam Dutch, MD;  Location: [place];  Service: Vascular;  Laterality: N/A;  . IR GENERIC HISTORICAL  08/15/2016   IR US  GUIDE  VASC ACCESS RIGHT 08/15/2016 Arne Cleveland, MD [facility]  . IR GENERIC HISTORICAL  08/15/2016   IR FLUORO GUIDE CV LINE RIGHT 08/15/2016 Arne Cleveland, MD [facility]  . IR IMAGING GUIDED PORT INSERTION  05/06/2018  . SUBMUCOSAL INJECTION  03/05/2018   Procedure: SUBMUCOSAL INJECTION;  Surgeon: Rush Landmark Telford Nab., MD;  Location: [place];  Service: Gastroenterology;;  spot tatoo  . TUBAL LIGATION      I have reviewed the social history and family history with the patient and they are unchanged from previous note.  ALLERGIES:  has No Known Allergies.  MEDICATIONS:  Current Outpatient Medications  Medication Sig Dispense Refill  . amLODipine (NORVASC) 10 MG tablet     . folic acid (FOLVITE) 1 MG tablet Take 1 tablet (1 mg total) by mouth daily. 30 tablet 0  . gabapentin (NEURONTIN) 100 MG capsule TAKE 1 CAPSULE (100 MG TOTAL) BY MOUTH AT BEDTIME. 90 capsule 2  . lidocaine-prilocaine (EMLA) cream Apply to affected area once 30 g 3  . metoprolol tartrate (LOPRESSOR) 25 MG tablet Take 0.5 tablets (12.5 mg total) by mouth 2 (two) times daily. 30 tablet 0  . multivitamin (RENA-VIT) TABS tablet Take 1 tablet by mouth at bedtime. 30 tablet 3  . Nutritional Supplements (FEEDING SUPPLEMENT, NEPRO CARB STEADY,) LIQD Take 237 mLs by mouth 3 (three) times daily as needed (Supplement). 90 Can 0  . ondansetron (ZOFRAN) 8 MG tablet Take 1 tablet (8 mg total) by mouth 2 (two) times daily as needed. Start on the third day after chemotherapy. 30 tablet 1  . oxyCODONE (OXY IR/ROXICODONE) 5 MG immediate release tablet Take 1 tablet (5 mg total) by mouth every 6 (six) hours as needed for severe pain. 30 tablet 0  . pantoprazole (PROTONIX) 40 MG tablet TAKE 1 TABLET BY MOUTH TWICE A DAY 60 tablet 1  . polyethylene glycol (MIRALAX) packet Take 17 g by mouth daily. 30 each 0  . prochlorperazine (COMPAZINE) 10 MG tablet Take 1 tablet (10 mg total) by mouth every 6 (six) hours as needed (Nausea or  vomiting). 30 tablet 1  . senna-docusate (SENOKOT-S) 8.6-50 MG tablet Take 2 tablets by mouth 2 (two) times daily. 30 tablet 0  . Vitamin D, Ergocalciferol, (DRISDOL) 50000 units CAPS capsule Take 50,000 Units by mouth once a week.  5   No current facility-administered medications for this visit.    Facility-Administered Medications Ordered in Other Visits  Medication Dose Route Frequency Provider Last Rate Last Dose  . sodium chloride flush (NS) 0.9 % injection 10 mL  10 mL Intracatheter PRN Truitt Merle, MD   10 mL at 07/27/18 1745    PHYSICAL EXAMINATION: ECOG PERFORMANCE STATUS: 1 - Symptomatic but completely ambulatory  Vitals:   01/13/19 0853  BP: 119/63  Pulse: 86  Resp: 18  Temp: 99.1 F (37.3 C)  SpO2: 100%   Filed Weights   01/13/19 0853  Weight: 142 lb 9.6 oz (64.7 kg)    GENERAL:alert, no distress and comfortable SKIN: skin color, texture, turgor are normal, no rashes or significant lesions EYES: normal, Conjunctiva are pink and non-injected, sclera clear NECK: supple, thyroid normal size, non-tender, without nodularity LYMPH:  no palpable lymphadenopathy in the cervical, axillary  LUNGS: clear to auscultation and percussion with normal breathing effort HEART: regular rate & rhythm and no murmurs and no lower extremity edema ABDOMEN:abdomen soft, non-tender and normal bowel sounds Musculoskeletal:no cyanosis of digits and no clubbing  NEURO: alert & oriented x  3 with fluent speech, no focal motor/sensory deficits  LABORATORY DATA:  I have reviewed the data as listed CBC Latest Ref Rng & Units 01/13/2019 12/30/2018 12/17/2018  WBC 4.0 - 10.5 K/uL 7.6 5.7 4.8  Hemoglobin 12.0 - 15.0 g/dL 7.6(L) 8.0(L) 8.8(L)  Hematocrit 36.0 - 46.0 % 22.9(L) 25.0(L) 27.1(L)  Platelets 150 - 400 K/uL 167 406(H) 143(L)     CMP Latest Ref Rng & Units 01/13/2019 12/30/2018 12/17/2018  Glucose 70 - 99 mg/dL 97 93 95  BUN 8 - 23 mg/dL 32(H) 28(H) 25(H)  Creatinine 0.44 - 1.00 mg/dL  5.77(HH) 6.33(HH) 4.87(H)  Sodium 135 - 145 mmol/L 137 136 135  Potassium 3.5 - 5.1 mmol/L 3.8 4.5 3.9  Chloride 98 - 111 mmol/L 95(L) 96(L) 97(L)  CO2 22 - 32 mmol/L _0 Calcium 8.9 - 10.3 mg/dL 8.3(L) 8.5(L) 8.3(L)  Total Protein 6.5 - 8.1 g/dL 7.3 7.0 7.0  Total Bilirubin 0.3 - 1.2 mg/dL 0.4 0.4 0.7  Alkaline Phos 38 - 126 U/L 125 120 112  AST 15 - 41 U/L 14(L) 15 18  ALT 0 - 44 U/L _1 RADIOGRAPHIC STUDIES: I have personally reviewed the radiological images as listed and agreed with the findings in the report. No results found.   ASSESSMENT & PLAN:  Mary Chapman is a 73 y.o. female with   1. Adenocarcinomainliver, likelyintrahepatic cholangiocarcinoma, cT2N1M0 stage IIIB, unresectable, MS-Stable -Diagnosed in 02/2018. She is not eligible for surgery due to tumor size and location.She was seen by [facility] Cloyd [facility] -Currently onfirst lineCisplatin and Gemcitabineevery 2 weekssince 12/2019with dose reduction. -She is tolerating chemotherapy moderately will, with mild fatigue, neuropathy, moderate anemia that required blood transfusion but noothersevere otherside affects.  -I previously discussed once she has disease progression we can considerFOLFIRI. I do notthink she can tolerateoxaliplatin given her neuropathy.  -Her FO results show BRCA1 mutation. She is a candidate for PARP inhibitor. Her germline test was negative for BRCA mutation.  -She is clinically stable. Labs reviewed, CBC and CMP WNL except Hg 7.6, Cr 5.77. Her Ca 19.9 has been stable lately. Overall adequate to proceed with Cisplatin/Gemcitabine at same dose -Plan to scan her in a month  -F/u in 2 weeks  2. HTN  -Currently on metoprolol. -stable   3. End Stage Renal Disease, on HD MWF -Treated with dialysis  -f/u with nephrologist Dr. Augustin Coupe -she is tolerating HD well  4. Anemia of chronic diseaseand chemo induced anemia -Due to CKD and malignancy -She received blood  transfusion after first cycle chemoand will consider blood transfusion when Hg <8.Due to the outbreak ofCOVID 19, blood supply has been low lately, will holdblood transfusion until hemoglobin less than 7 -We previously discussed the role ofEPOfor anemia, if she requires frequent blood transfusion.She voiced good understanding,and agrees withEPO if she really needs it. -She was given last blood transfusion on 11/22/18.  -I previously recommended her to take multivitamins -Hg at 7.6 today (01/13/19), will arrange 1u blood in 2 days.  5.Goal of care discussion  -The patient understands the goal of care is palliative, to prolong his life -she is full code now  6. Neuropathy  -Likely secondary to Cisplatin, which I have reduced dose  -She developed neuropathy of legs and feet, mainly at night  -Controlled on 167m nightly Gabapentin. Given her ESRD she can take up to 2057mat night and if needed 1 tab during the day.    Plan -lab reviewedandadequate  forchemotherapy cisplatin and gemcitabine today at same low dose  -Lab, flush, f/u and chemo in 2 weeks, will order restaging CT on next visit  -1u blood over 3 hours on 8/29     No problem-specific Assessment & Plan notes found for this encounter.   Orders Placed This Encounter  Procedures  . Practitioner attestation of consent    I, the ordering practitioner, attest that I have discussed with the patient the benefits, risks, side effects, alternatives, likelihood of achieving goals and potential problems during recovery for the procedure listed.    Standing Status:   Future    Standing Expiration Date:   01/13/2020    Order Specific Question:   Procedure    Answer:   Blood Product(s)  . Complete patient signature process for consent form    Standing Status:   Future    Standing Expiration Date:   01/13/2020  . Care order/instruction    Transfuse Parameters    Standing Status:   Future    Standing Expiration Date:    01/13/2020  . Type and screen         Standing Status:   Future    Number of Occurrences:   1    Standing Expiration Date:   01/13/2020   All questions were answered. The patient knows to call the clinic with any problems, questions or concerns. No barriers to learning was detected. I spent 20 minutes counseling the patient face to face. The total time spent in the appointment was 25 minutes and more than 50% was on counseling and review of test results     Truitt Merle, MD 01/13/2019   I, Joslyn Devon, am acting as scribe for Truitt Merle, MD.   I have reviewed the above documentation for accuracy and completeness, and I agree with the above.

## 2019-01-13 ENCOUNTER — Inpatient Hospital Stay: Payer: Medicare Other

## 2019-01-13 ENCOUNTER — Other Ambulatory Visit: Payer: Self-pay

## 2019-01-13 ENCOUNTER — Telehealth: Payer: Self-pay | Admitting: Hematology

## 2019-01-13 ENCOUNTER — Encounter: Payer: Self-pay | Admitting: Hematology

## 2019-01-13 ENCOUNTER — Inpatient Hospital Stay (HOSPITAL_BASED_OUTPATIENT_CLINIC_OR_DEPARTMENT_OTHER): Payer: Medicare Other | Admitting: Hematology

## 2019-01-13 ENCOUNTER — Telehealth: Payer: Self-pay | Admitting: *Deleted

## 2019-01-13 VITALS — BP 119/63 | HR 86 | Temp 99.1°F | Resp 18 | Ht 66.0 in | Wt 142.6 lb

## 2019-01-13 DIAGNOSIS — N186 End stage renal disease: Secondary | ICD-10-CM

## 2019-01-13 DIAGNOSIS — Z992 Dependence on renal dialysis: Secondary | ICD-10-CM | POA: Diagnosis not present

## 2019-01-13 DIAGNOSIS — C221 Intrahepatic bile duct carcinoma: Secondary | ICD-10-CM

## 2019-01-13 DIAGNOSIS — Z95828 Presence of other vascular implants and grafts: Secondary | ICD-10-CM

## 2019-01-13 DIAGNOSIS — I10 Essential (primary) hypertension: Secondary | ICD-10-CM

## 2019-01-13 LAB — CBC WITH DIFFERENTIAL (CANCER CENTER ONLY)
Abs Immature Granulocytes: 0.04 10*3/uL (ref 0.00–0.07)
Basophils Absolute: 0 10*3/uL (ref 0.0–0.1)
Basophils Relative: 0 %
Eosinophils Absolute: 0.4 10*3/uL (ref 0.0–0.5)
Eosinophils Relative: 5 %
HCT: 22.9 % — ABNORMAL LOW (ref 36.0–46.0)
Hemoglobin: 7.6 g/dL — ABNORMAL LOW (ref 12.0–15.0)
Immature Granulocytes: 1 %
Lymphocytes Relative: 18 %
Lymphs Abs: 1.4 10*3/uL (ref 0.7–4.0)
MCH: 35 pg — ABNORMAL HIGH (ref 26.0–34.0)
MCHC: 33.2 g/dL (ref 30.0–36.0)
MCV: 105.5 fL — ABNORMAL HIGH (ref 80.0–100.0)
Monocytes Absolute: 0.8 10*3/uL (ref 0.1–1.0)
Monocytes Relative: 10 %
Neutro Abs: 5 10*3/uL (ref 1.7–7.7)
Neutrophils Relative %: 66 %
Platelet Count: 167 10*3/uL (ref 150–400)
RBC: 2.17 MIL/uL — ABNORMAL LOW (ref 3.87–5.11)
RDW: 13.7 % (ref 11.5–15.5)
WBC Count: 7.6 10*3/uL (ref 4.0–10.5)
nRBC: 0 % (ref 0.0–0.2)

## 2019-01-13 LAB — CMP (CANCER CENTER ONLY)
ALT: 6 U/L (ref 0–44)
AST: 14 U/L — ABNORMAL LOW (ref 15–41)
Albumin: 3.3 g/dL — ABNORMAL LOW (ref 3.5–5.0)
Alkaline Phosphatase: 125 U/L (ref 38–126)
Anion gap: 13 (ref 5–15)
BUN: 32 mg/dL — ABNORMAL HIGH (ref 8–23)
CO2: 29 mmol/L (ref 22–32)
Calcium: 8.3 mg/dL — ABNORMAL LOW (ref 8.9–10.3)
Chloride: 95 mmol/L — ABNORMAL LOW (ref 98–111)
Creatinine: 5.77 mg/dL (ref 0.44–1.00)
GFR, Est AFR Am: 8 mL/min — ABNORMAL LOW (ref >60)
GFR, Estimated: 7 mL/min — ABNORMAL LOW (ref >60)
Glucose, Bld: 97 mg/dL (ref 70–99)
Potassium: 3.8 mmol/L (ref 3.5–5.1)
Sodium: 137 mmol/L (ref 135–145)
Total Bilirubin: 0.4 mg/dL (ref 0.3–1.2)
Total Protein: 7.3 g/dL (ref 6.5–8.1)

## 2019-01-13 LAB — MAGNESIUM: Magnesium: 2 mg/dL (ref 1.7–2.4)

## 2019-01-13 MED ORDER — SODIUM CHLORIDE 0.9% FLUSH
10.0000 mL | INTRAVENOUS | Status: DC | PRN
  Administered 2019-01-13: 09:00:00 10 mL
  Filled 2019-01-13: qty 10

## 2019-01-13 MED ORDER — SODIUM CHLORIDE 0.9% FLUSH
10.0000 mL | INTRAVENOUS | Status: DC | PRN
  Administered 2019-01-13: 15:00:00 10 mL
  Filled 2019-01-13: qty 10

## 2019-01-13 MED ORDER — SODIUM CHLORIDE 0.9 % IV SOLN
Freq: Once | INTRAVENOUS | Status: AC
  Administered 2019-01-13: 11:00:00 via INTRAVENOUS
  Filled 2019-01-13: qty 5

## 2019-01-13 MED ORDER — SODIUM CHLORIDE 0.9 % IV SOLN
600.0000 mg/m2 | Freq: Once | INTRAVENOUS | Status: AC
  Administered 2019-01-13: 12:00:00 1026 mg via INTRAVENOUS
  Filled 2019-01-13: qty 26.98

## 2019-01-13 MED ORDER — ALTEPLASE 2 MG IJ SOLR
2.0000 mg | Freq: Once | INTRAMUSCULAR | Status: AC | PRN
  Administered 2019-01-13: 10:00:00 2 mg
  Filled 2019-01-13: qty 2

## 2019-01-13 MED ORDER — SODIUM CHLORIDE 0.9 % IV SOLN
10.0000 mg/m2 | Freq: Once | INTRAVENOUS | Status: AC
  Administered 2019-01-13: 17 mg via INTRAVENOUS
  Filled 2019-01-13: qty 17

## 2019-01-13 MED ORDER — PALONOSETRON HCL INJECTION 0.25 MG/5ML
0.2500 mg | Freq: Once | INTRAVENOUS | Status: AC
  Administered 2019-01-13: 11:00:00 0.25 mg via INTRAVENOUS

## 2019-01-13 MED ORDER — ALTEPLASE 2 MG IJ SOLR
INTRAMUSCULAR | Status: AC
  Filled 2019-01-13: qty 2

## 2019-01-13 MED ORDER — SODIUM CHLORIDE 0.9 % IV SOLN
Freq: Once | INTRAVENOUS | Status: AC
  Administered 2019-01-13: 11:00:00 via INTRAVENOUS
  Filled 2019-01-13: qty 250

## 2019-01-13 MED ORDER — HEPARIN SOD (PORK) LOCK FLUSH 100 UNIT/ML IV SOLN
500.0000 [IU] | Freq: Once | INTRAVENOUS | Status: AC | PRN
  Administered 2019-01-13: 500 [IU]
  Filled 2019-01-13: qty 5

## 2019-01-13 MED ORDER — PALONOSETRON HCL INJECTION 0.25 MG/5ML
INTRAVENOUS | Status: AC
  Filled 2019-01-13: qty 5

## 2019-01-13 NOTE — Progress Notes (Signed)
Per Dr. Burr Medico okay to treat with Hbg 7.6 and Creatinine 5.77

## 2019-01-13 NOTE — Patient Instructions (Signed)

## 2019-01-13 NOTE — Progress Notes (Signed)
CRITICAL VALUE STICKER  CRITICAL VALUE: Creatinine 5.77  RECEIVER (on-site recipient of call):  Valda Favia RN  DATE & TIME NOTIFIED: 01/13/2019 0930  MESSENGER (representative from lab): [facility] - Triage  MD NOTIFIED: Dr. Burr Medico  TIME OF NOTIFICATION:  RESPONSE:  No new orders patient is on dialysis

## 2019-01-13 NOTE — Patient Instructions (Signed)
[facility] Discharge Instructions for Patients Receiving Chemotherapy  Today you received the following chemotherapy agents:  Gemzar, Cisplatin  To help prevent nausea and vomiting after your treatment, we encourage you to take your nausea medication as prescribed.   If you develop nausea and vomiting that is not controlled by your nausea medication, call the clinic.   BELOW ARE SYMPTOMS THAT SHOULD BE REPORTED IMMEDIATELY:  *FEVER GREATER THAN 100.5 F  *CHILLS WITH OR WITHOUT FEVER  NAUSEA AND VOMITING THAT IS NOT CONTROLLED WITH YOUR NAUSEA MEDICATION  *UNUSUAL SHORTNESS OF BREATH  *UNUSUAL BRUISING OR BLEEDING  TENDERNESS IN MOUTH AND THROAT WITH OR WITHOUT PRESENCE OF ULCERS  *URINARY PROBLEMS  *BOWEL PROBLEMS  UNUSUAL RASH Items with * indicate a potential emergency and should be followed up as soon as possible.  Feel free to call the clinic should you have any questions or concerns. The clinic phone number is [phone].  Please show the [place] at check-in to the Emergency Department and triage nurse.

## 2019-01-13 NOTE — Telephone Encounter (Signed)
Received call report from [facility].  "Today's creat = 5.77."  Reached collaborative with results.  Scheduled provider F/U today.

## 2019-01-13 NOTE — Telephone Encounter (Signed)
Per 8/27 los appt already scheduled.

## 2019-01-14 ENCOUNTER — Other Ambulatory Visit: Payer: Self-pay

## 2019-01-14 ENCOUNTER — Emergency Department (HOSPITAL_BASED_OUTPATIENT_CLINIC_OR_DEPARTMENT_OTHER)
Admission: EM | Admit: 2019-01-14 | Discharge: 2019-01-15 | Disposition: A | Discharge status: Home / Self Care | Payer: Medicare Other | Source: Home / Self Care | Attending: Emergency Medicine | Admitting: Emergency Medicine

## 2019-01-14 ENCOUNTER — Other Ambulatory Visit: Payer: Self-pay | Admitting: *Deleted

## 2019-01-14 ENCOUNTER — Encounter (HOSPITAL_BASED_OUTPATIENT_CLINIC_OR_DEPARTMENT_OTHER): Payer: Self-pay

## 2019-01-14 DIAGNOSIS — D649 Anemia, unspecified: Secondary | ICD-10-CM

## 2019-01-14 DIAGNOSIS — F17210 Nicotine dependence, cigarettes, uncomplicated: Secondary | ICD-10-CM | POA: Insufficient documentation

## 2019-01-14 DIAGNOSIS — R42 Dizziness and giddiness: Secondary | ICD-10-CM | POA: Insufficient documentation

## 2019-01-14 DIAGNOSIS — Z992 Dependence on renal dialysis: Secondary | ICD-10-CM | POA: Insufficient documentation

## 2019-01-14 DIAGNOSIS — N186 End stage renal disease: Secondary | ICD-10-CM | POA: Insufficient documentation

## 2019-01-14 DIAGNOSIS — E860 Dehydration: Secondary | ICD-10-CM | POA: Insufficient documentation

## 2019-01-14 DIAGNOSIS — C221 Intrahepatic bile duct carcinoma: Secondary | ICD-10-CM

## 2019-01-14 DIAGNOSIS — Z79899 Other long term (current) drug therapy: Secondary | ICD-10-CM | POA: Insufficient documentation

## 2019-01-14 DIAGNOSIS — I120 Hypertensive chronic kidney disease with stage 5 chronic kidney disease or end stage renal disease: Secondary | ICD-10-CM | POA: Insufficient documentation

## 2019-01-14 LAB — CBC WITH DIFFERENTIAL/PLATELET
Abs Immature Granulocytes: 0.02 10*3/uL (ref 0.00–0.07)
Basophils Absolute: 0 10*3/uL (ref 0.0–0.1)
Basophils Relative: 0 %
Eosinophils Absolute: 0.3 10*3/uL (ref 0.0–0.5)
Eosinophils Relative: 3 %
HCT: 24.2 % — ABNORMAL LOW (ref 36.0–46.0)
Hemoglobin: 7.8 g/dL — ABNORMAL LOW (ref 12.0–15.0)
Immature Granulocytes: 0 %
Lymphocytes Relative: 12 %
Lymphs Abs: 1.1 10*3/uL (ref 0.7–4.0)
MCH: 35.1 pg — ABNORMAL HIGH (ref 26.0–34.0)
MCHC: 32.2 g/dL (ref 30.0–36.0)
MCV: 109 fL — ABNORMAL HIGH (ref 80.0–100.0)
Monocytes Absolute: 0.6 10*3/uL (ref 0.1–1.0)
Monocytes Relative: 7 %
Neutro Abs: 6.8 10*3/uL (ref 1.7–7.7)
Neutrophils Relative %: 78 %
Platelets: 208 10*3/uL (ref 150–400)
RBC: 2.22 MIL/uL — ABNORMAL LOW (ref 3.87–5.11)
RDW: 13.8 % (ref 11.5–15.5)
WBC: 8.8 10*3/uL (ref 4.0–10.5)
nRBC: 0 % (ref 0.0–0.2)

## 2019-01-14 LAB — COMPREHENSIVE METABOLIC PANEL
ALT: 11 U/L (ref 0–44)
AST: 21 U/L (ref 15–41)
Albumin: 3.3 g/dL — ABNORMAL LOW (ref 3.5–5.0)
Alkaline Phosphatase: 117 U/L (ref 38–126)
Anion gap: 16 — ABNORMAL HIGH (ref 5–15)
BUN: 31 mg/dL — ABNORMAL HIGH (ref 8–23)
CO2: 28 mmol/L (ref 22–32)
Calcium: 7.6 mg/dL — ABNORMAL LOW (ref 8.9–10.3)
Chloride: 94 mmol/L — ABNORMAL LOW (ref 98–111)
Creatinine, Ser: 4.5 mg/dL — ABNORMAL HIGH (ref 0.44–1.00)
GFR calc Af Amer: 11 mL/min — ABNORMAL LOW (ref >60)
GFR calc non Af Amer: 9 mL/min — ABNORMAL LOW (ref >60)
Glucose, Bld: 172 mg/dL — ABNORMAL HIGH (ref 70–99)
Potassium: 3.8 mmol/L (ref 3.5–5.1)
Sodium: 138 mmol/L (ref 135–145)
Total Bilirubin: 0.5 mg/dL (ref 0.3–1.2)
Total Protein: 7.3 g/dL (ref 6.5–8.1)

## 2019-01-14 LAB — CANCER ANTIGEN 19-9: CA 19-9: 36 U/mL — ABNORMAL HIGH (ref 0–35)

## 2019-01-14 LAB — PREPARE RBC (CROSSMATCH)

## 2019-01-14 MED ORDER — SODIUM CHLORIDE 0.9 % IV BOLUS
250.0000 mL | Freq: Once | INTRAVENOUS | Status: AC
  Administered 2019-01-14: 250 mL via INTRAVENOUS

## 2019-01-14 NOTE — ED Triage Notes (Signed)
Pt c/o weakness and feeling like she is going to pass out-sx started 2pm-pt to triage in w/c-NAD

## 2019-01-15 ENCOUNTER — Other Ambulatory Visit: Payer: Self-pay

## 2019-01-15 ENCOUNTER — Inpatient Hospital Stay: Payer: Medicare Other

## 2019-01-15 DIAGNOSIS — C221 Intrahepatic bile duct carcinoma: Secondary | ICD-10-CM

## 2019-01-15 MED ORDER — SODIUM CHLORIDE 0.9% FLUSH
3.0000 mL | INTRAVENOUS | Status: DC | PRN
  Filled 2019-01-15: qty 10

## 2019-01-15 MED ORDER — HEPARIN SOD (PORK) LOCK FLUSH 100 UNIT/ML IV SOLN
500.0000 [IU] | Freq: Once | INTRAVENOUS | Status: AC
  Administered 2019-01-15: 500 [IU]

## 2019-01-15 MED ORDER — HEPARIN SOD (PORK) LOCK FLUSH 100 UNIT/ML IV SOLN
500.0000 [IU] | Freq: Every day | INTRAVENOUS | Status: AC | PRN
  Administered 2019-01-15: 11:00:00 500 [IU]
  Filled 2019-01-15: qty 5

## 2019-01-15 MED ORDER — HEPARIN SOD (PORK) LOCK FLUSH 100 UNIT/ML IV SOLN
INTRAVENOUS | Status: AC
  Filled 2019-01-15: qty 5

## 2019-01-15 MED ORDER — SODIUM CHLORIDE 0.9% FLUSH
10.0000 mL | INTRAVENOUS | Status: AC | PRN
  Administered 2019-01-15: 10 mL
  Filled 2019-01-15: qty 10

## 2019-01-15 NOTE — ED Provider Notes (Signed)
[place] EMERGENCY DEPARTMENT Provider Note   CSN: [id number] Arrival date & time: 01/14/19  2239     History   Chief Complaint Chief Complaint  Patient presents with  . Weakness    HPI Mary Chapman is a 73 y.o. female.     Patient presents to the emergency department for evaluation of generalized weakness with dizziness and sensation of feeling like she is going to pass out.  She did have a syncopal episode 2 weeks ago, after which she was seen at [facility].  This was felt to be secondary to volume depletion from dialysis.  She has not had any syncope since.  She did have a full dialysis session today with removal of 3 L of fluid.  Symptoms of weakness and dizziness, once again, began after dialysis.  She does not make any urine.  She has not had any fever, cough, COVID-19 symptoms.  She denies chest pain, shortness of breath.  There is no associated headache, numbness, tingling, weakness or focal neurologic symptoms.     Past Medical History:  Diagnosis Date  . Diverticulitis   . Focal segmental glomerulosclerosis    ESRD on MWF HD  . Hypertension   . intrahepatic cholangio ca dx'd10/2019  . Renal insufficiency    dialysis pt MWF  . Tobacco abuse     Patient Active Problem List   Diagnosis Date Noted  . Genetic testing 11/30/2018  . Port-A-Cath in place 05/18/2018  . Cough   . Cholangiocarcinoma ([place])   . Sepsis ([place]) 04/09/2018  . Intrahepatic cholangiocarcinoma ([place]) 03/11/2018  . Mass of cecum with bleeding 03/04/2018  . GI bleed 03/01/2018  . Gastrointestinal hemorrhage associated with anorectal source   . Mass of right lobe of liver   . Diverticulitis 02/23/2018  . ESRD on dialysis ([place]) 02/23/2018  . Hypophosphatemia 03/17/2017  . Hyperphosphatemia 03/17/2017  . Elevated troponin 03/16/2017  . Hyperlipidemia 03/16/2017  . SOB (shortness of breath) 03/16/2017  . Focal segmental glomerulosclerosis 03/15/2017  . Nephrotic syndrome 08/20/2016   . Essential hypertension 08/08/2016  . Dyspnea 08/08/2016  . Tobacco abuse   . Anasarca 08/07/2016    Past Surgical History:  Procedure Laterality Date  . AV FISTULA PLACEMENT Left 03/18/2017   Procedure: Brachiocephalic ARTERIOVENOUS (AV) FISTULA CREATION left arm;  Surgeon: Elam Dutch, MD;  Location: [place];  Service: Vascular;  Laterality: Left;  . BIOPSY  03/05/2018   Procedure: BIOPSY;  Surgeon: Rush Landmark Telford Nab., MD;  Location: [place];  Service: Gastroenterology;;  enteroscopy bx /cytology brushing Greig Castilla bx  . COLONOSCOPY WITH PROPOFOL N/A 03/05/2018   Procedure: COLONOSCOPY WITH PROPOFOL;  Surgeon: Rush Landmark Telford Nab., MD;  Location: [place];  Service: Gastroenterology;  Laterality: N/A;  Bx, Spot  . ENTEROSCOPY N/A 03/05/2018   Procedure: ENTEROSCOPY;  Surgeon: Rush Landmark Telford Nab., MD;  Location: [place];  Service: Gastroenterology;  Laterality: N/A;  BX, Brushing, & Spot  . INSERTION OF DIALYSIS CATHETER N/A 03/18/2017   Procedure: INSERTION OF DIALYSIS CATHETER;  Surgeon: Elam Dutch, MD;  Location: [place];  Service: Vascular;  Laterality: N/A;  . IR GENERIC HISTORICAL  08/15/2016   IR US GUIDE VASC ACCESS RIGHT 08/15/2016 Arne Cleveland, MD [facility]  . IR GENERIC HISTORICAL  08/15/2016   IR FLUORO GUIDE CV LINE RIGHT 08/15/2016 Arne Cleveland, MD [facility]  . IR IMAGING GUIDED PORT INSERTION  05/06/2018  . SUBMUCOSAL INJECTION  03/05/2018   Procedure: SUBMUCOSAL INJECTION;  Surgeon: Irving Copas.,  MD;  Location: [place];  Service: Gastroenterology;;  spot tatoo  . TUBAL LIGATION       OB History   No obstetric history on file.      Home Medications    Prior to Admission medications   Medication Sig Start Date End Date Taking? Authorizing Provider  amLODipine (NORVASC) 10 MG tablet  09/15/18   [provider]  folic acid (FOLVITE) 1 MG tablet Take 1 tablet (1 mg total) by mouth daily. 04/16/18    Kayleen Memos, DO  gabapentin (NEURONTIN) 100 MG capsule TAKE 1 CAPSULE (100 MG TOTAL) BY MOUTH AT BEDTIME. 12/27/18   Truitt Merle, MD  lidocaine-prilocaine (EMLA) cream Apply to affected area once 05/10/18   Truitt Merle, MD  metoprolol tartrate (LOPRESSOR) 25 MG tablet Take 0.5 tablets (12.5 mg total) by mouth 2 (two) times daily. 04/16/18   Kayleen Memos, DO  multivitamin (RENA-VIT) TABS tablet Take 1 tablet by mouth at bedtime. 08/20/16   Rama, Venetia Maxon, MD  Nutritional Supplements (FEEDING SUPPLEMENT, NEPRO CARB STEADY,) LIQD Take 237 mLs by mouth 3 (three) times daily as needed (Supplement). 03/06/18   Ghimire, Henreitta Leber, MD  ondansetron (ZOFRAN) 8 MG tablet Take 1 tablet (8 mg total) by mouth 2 (two) times daily as needed. Start on the third day after chemotherapy. 05/10/18   Truitt Merle, MD  oxyCODONE (OXY IR/ROXICODONE) 5 MG immediate release tablet Take 1 tablet (5 mg total) by mouth every 6 (six) hours as needed for severe pain. 06/11/18   Truitt Merle, MD  pantoprazole (PROTONIX) 40 MG tablet TAKE 1 TABLET BY MOUTH TWICE A DAY 12/27/18   Truitt Merle, MD  polyethylene glycol [facility]) packet Take 17 g by mouth daily. 03/06/18   Ghimire, Henreitta Leber, MD  prochlorperazine (COMPAZINE) 10 MG tablet Take 1 tablet (10 mg total) by mouth every 6 (six) hours as needed (Nausea or vomiting). 05/10/18   Truitt Merle, MD  senna-docusate (SENOKOT-S) 8.6-50 MG tablet Take 2 tablets by mouth 2 (two) times daily. 04/16/18   Kayleen Memos, DO  Vitamin D, Ergocalciferol, (DRISDOL) 50000 units CAPS capsule Take 50,000 Units by mouth once a week. 01/16/17   [provider]    Family History Family History  Problem Relation Age of Onset  . Hypertension Mother   . Diabetes Mother   . Hypertension Father   . Diabetes Father   . Hypertension Sister   . Hypertension Brother     Social History Social History   Tobacco Use  . Smoking status: Current Some Day Smoker    Packs/day: 0.25    Years: 20.00    Pack  years: 5.00    Types: Cigarettes  . Smokeless tobacco: Never Used  . Tobacco comment: 3 cigs/day  Substance Use Topics  . Alcohol use: No    Comment: moderate drinker for 10 years, quit in 30 years  . Drug use: No     Allergies   Patient has no known allergies.   Review of Systems Review of Systems  Neurological: Positive for dizziness and weakness.  All other systems reviewed and are negative.    Physical Exam Updated Vital Signs BP 111/65   Pulse 75   Temp 99.1 F (37.3 C) (Oral)   Resp 18   SpO2 98%   Physical Exam Vitals signs and nursing note reviewed.  Constitutional:      General: She is not in acute distress.    Appearance: Normal appearance. She is well-developed.  HENT:     Head: Normocephalic and atraumatic.     Right Ear: Hearing normal.     Left Ear: Hearing normal.     Nose: Nose normal.  Eyes:     Conjunctiva/sclera: Conjunctivae normal.     Pupils: Pupils are equal, round, and reactive to light.  Neck:     Musculoskeletal: Normal range of motion and neck supple.  Cardiovascular:     Rate and Rhythm: Regular rhythm.     Heart sounds: S1 normal and S2 normal. No murmur. No friction rub. No gallop.   Pulmonary:     Effort: Pulmonary effort is normal. No respiratory distress.     Breath sounds: Normal breath sounds.  Chest:     Chest wall: No tenderness.  Abdominal:     General: Bowel sounds are normal.     Palpations: Abdomen is soft.     Tenderness: There is no abdominal tenderness. There is no guarding or rebound. Negative signs include Murphy's sign and McBurney's sign.     Hernia: No hernia is present.  Musculoskeletal: Normal range of motion.  Skin:    General: Skin is warm and dry.     Findings: No rash.  Neurological:     Mental Status: She is alert and oriented to person, place, and time.     GCS: GCS eye subscore is 4. GCS verbal subscore is 5. GCS motor subscore is 6.     Cranial Nerves: No cranial nerve deficit.     Sensory:  No sensory deficit.     Coordination: Coordination normal.  Psychiatric:        Speech: Speech normal.        Behavior: Behavior normal.        Thought Content: Thought content normal.      ED Treatments / Results  Labs (all labs ordered are listed, but only abnormal results are displayed) Labs Reviewed  CBC WITH DIFFERENTIAL/PLATELET - Abnormal; Notable for the following components:      Result Value   RBC 2.22 (*)    Hemoglobin 7.8 (*)    HCT 24.2 (*)    MCV 109.0 (*)    MCH 35.1 (*)    All other components within normal limits  COMPREHENSIVE METABOLIC PANEL - Abnormal; Notable for the following components:   Chloride 94 (*)    Glucose, Bld 172 (*)    BUN 31 (*)    Creatinine, Ser 4.50 (*)    Calcium 7.6 (*)    Albumin 3.3 (*)    GFR calc non Af Amer 9 (*)    GFR calc Af Amer 11 (*)    Anion gap 16 (*)    All other components within normal limits    EKG EKG Interpretation  Date/Time:  Friday January 14 2019 23:05:43 EDT Ventricular Rate:  78 PR Interval:    QRS Duration: 85 QT Interval:  434 QTC Calculation: 495 R Axis:   67 Text Interpretation:  Sinus rhythm Abnormal R-wave progression, early transition Consider left ventricular hypertrophy Borderline prolonged QT interval No significant change since last tracing Confirmed by Orpah Greek [id number]) on 01/15/2019 12:24:22 AM   Radiology No results found.  Procedures Procedures (including critical care time)  Medications Ordered in ED Medications  sodium chloride 0.9 % bolus 250 mL (0 mLs Intravenous Stopped 01/15/19 0105)  heparin lock flush 100 unit/mL (500 Units Intracatheter Given 01/15/19 0105)     Initial Impression / Assessment and Plan / ED Course  I have reviewed the triage vital signs and the nursing notes.  Pertinent labs & imaging results that were available during my care of the patient were reviewed by me and considered in my medical decision making (see chart for details).         Patient presents with generalized weakness and dizziness after dialysis.  This is the second time in the last couple of weeks that she has had symptoms after dialysis.  Patient does have borderline positive orthostatic vital signs.  She was symptomatic with changes in position as well.  Patient is anemic.  This is a known problem for her with her cancer treatment.  She is scheduled for transfusion tomorrow at the cancer center.  Her anemia has not worsened since her last blood work and therefore she does not require hospitalization for urgent transfusion.  This is likely a mixed picture with some mild symptomatic anemia but also volume depletion from dialysis.  Patient given a small fluid bolus with improvement.  She feels comfortable going home and I feel it is appropriate for her to have outpatient transfusion in the morning as is already scheduled.  Final Clinical Impressions(s) / ED Diagnoses   Final diagnoses:  Dehydration  Anemia, unspecified type    ED Discharge Orders    None       Orpah Greek, MD 01/15/19 [phone]

## 2019-01-15 NOTE — Patient Instructions (Signed)
Blood Transfusion, Adult, Care After This sheet gives you information about how to care for yourself after your procedure. Your doctor may also give you more specific instructions. If you have problems or questions, contact your doctor. Follow these instructions at home:   Take over-the-counter and prescription medicines only as told by your doctor.  Go back to your normal activities as told by your doctor.  Follow instructions from your doctor about how to take care of the area where an IV tube was put into your vein (insertion site). Make sure you: ? Wash your hands with soap and water before you change your bandage (dressing). If there is no soap and water, use hand sanitizer. ? Change your bandage as told by your doctor.  Check your IV insertion site every day for signs of infection. Check for: ? More redness, swelling, or pain. ? More fluid or blood. ? Warmth. ? Pus or a bad smell. Contact a doctor if:  You have more redness, swelling, or pain around the IV insertion site.  You have more fluid or blood coming from the IV insertion site.  Your IV insertion site feels warm to the touch.  You have pus or a bad smell coming from the IV insertion site.  Your pee (urine) turns pink, red, or brown.  You feel weak after doing your normal activities. Get help right away if:  You have signs of a serious allergic or body defense (immune) system reaction, including: ? Itchiness. ? Hives. ? Trouble breathing. ? Anxiety. ? Pain in your chest or lower back. ? Fever, flushing, and chills. ? Fast pulse. ? Rash. ? Watery poop (diarrhea). ? Throwing up (vomiting). ? Dark pee. ? Serious headache. ? Dizziness. ? Stiff neck. ? Yellow color in your face or the white parts of your eyes (jaundice). Summary  After a blood transfusion, return to your normal activities as told by your doctor.  Every day, check for signs of infection where the IV tube was put into your vein.  Some  signs of infection are warm skin, more redness and pain, more fluid or blood, and pus or a bad smell where the needle went in.  Contact your doctor if you feel weak or have any unusual symptoms. This information is not intended to replace advice given to you by your health care provider. Make sure you discuss any questions you have with your health care provider. Document Released: 05/26/2014 Document Revised: 09/09/2017 Document Reviewed: 12/28/2015 Elsevier Patient Education  2020 Elsevier Inc.  

## 2019-01-17 LAB — TYPE AND SCREEN
ABO/RH(D): O POS
Antibody Screen: NEGATIVE
Unit division: 0

## 2019-01-17 LAB — BPAM RBC
Blood Product Expiration Date: 202009202359
ISSUE DATE / TIME: 202008290848
Unit Type and Rh: 5100

## 2019-01-26 ENCOUNTER — Ambulatory Visit: Payer: Medicare Other | Admitting: Hematology

## 2019-01-26 ENCOUNTER — Other Ambulatory Visit: Payer: Medicare Other

## 2019-01-26 ENCOUNTER — Ambulatory Visit: Payer: Medicare Other

## 2019-01-27 ENCOUNTER — Inpatient Hospital Stay (HOSPITAL_BASED_OUTPATIENT_CLINIC_OR_DEPARTMENT_OTHER): Payer: Medicare Other | Admitting: Nurse Practitioner

## 2019-01-27 ENCOUNTER — Inpatient Hospital Stay: Payer: Medicare Other

## 2019-01-27 ENCOUNTER — Encounter: Payer: Self-pay | Admitting: Nurse Practitioner

## 2019-01-27 ENCOUNTER — Inpatient Hospital Stay: Payer: Medicare Other | Attending: Hematology

## 2019-01-27 ENCOUNTER — Other Ambulatory Visit: Payer: Self-pay

## 2019-01-27 ENCOUNTER — Telehealth: Payer: Self-pay | Admitting: Nurse Practitioner

## 2019-01-27 VITALS — BP 151/78 | HR 66 | Temp 98.0°F | Resp 18 | Ht 66.0 in | Wt 141.5 lb

## 2019-01-27 DIAGNOSIS — N186 End stage renal disease: Secondary | ICD-10-CM | POA: Insufficient documentation

## 2019-01-27 DIAGNOSIS — Z5111 Encounter for antineoplastic chemotherapy: Secondary | ICD-10-CM | POA: Diagnosis not present

## 2019-01-27 DIAGNOSIS — C221 Intrahepatic bile duct carcinoma: Secondary | ICD-10-CM | POA: Diagnosis not present

## 2019-01-27 DIAGNOSIS — Z79899 Other long term (current) drug therapy: Secondary | ICD-10-CM | POA: Insufficient documentation

## 2019-01-27 DIAGNOSIS — Z87891 Personal history of nicotine dependence: Secondary | ICD-10-CM | POA: Insufficient documentation

## 2019-01-27 DIAGNOSIS — I10 Essential (primary) hypertension: Secondary | ICD-10-CM | POA: Diagnosis not present

## 2019-01-27 DIAGNOSIS — D6481 Anemia due to antineoplastic chemotherapy: Secondary | ICD-10-CM | POA: Diagnosis not present

## 2019-01-27 DIAGNOSIS — Z95828 Presence of other vascular implants and grafts: Secondary | ICD-10-CM

## 2019-01-27 DIAGNOSIS — Z992 Dependence on renal dialysis: Secondary | ICD-10-CM | POA: Diagnosis not present

## 2019-01-27 DIAGNOSIS — G629 Polyneuropathy, unspecified: Secondary | ICD-10-CM | POA: Diagnosis not present

## 2019-01-27 DIAGNOSIS — J439 Emphysema, unspecified: Secondary | ICD-10-CM | POA: Diagnosis not present

## 2019-01-27 LAB — CBC WITH DIFFERENTIAL (CANCER CENTER ONLY)
Abs Immature Granulocytes: 0.03 10*3/uL (ref 0.00–0.07)
Basophils Absolute: 0 10*3/uL (ref 0.0–0.1)
Basophils Relative: 0 %
Eosinophils Absolute: 0.4 10*3/uL (ref 0.0–0.5)
Eosinophils Relative: 6 %
HCT: 26.4 % — ABNORMAL LOW (ref 36.0–46.0)
Hemoglobin: 8.5 g/dL — ABNORMAL LOW (ref 12.0–15.0)
Immature Granulocytes: 1 %
Lymphocytes Relative: 20 %
Lymphs Abs: 1.3 10*3/uL (ref 0.7–4.0)
MCH: 33.7 pg (ref 26.0–34.0)
MCHC: 32.2 g/dL (ref 30.0–36.0)
MCV: 104.8 fL — ABNORMAL HIGH (ref 80.0–100.0)
Monocytes Absolute: 0.8 10*3/uL (ref 0.1–1.0)
Monocytes Relative: 13 %
Neutro Abs: 3.8 10*3/uL (ref 1.7–7.7)
Neutrophils Relative %: 60 %
Platelet Count: 231 10*3/uL (ref 150–400)
RBC: 2.52 MIL/uL — ABNORMAL LOW (ref 3.87–5.11)
RDW: 14.9 % (ref 11.5–15.5)
WBC Count: 6.3 10*3/uL (ref 4.0–10.5)
nRBC: 0 % (ref 0.0–0.2)

## 2019-01-27 LAB — CMP (CANCER CENTER ONLY)
ALT: 7 U/L (ref 0–44)
AST: 16 U/L (ref 15–41)
Albumin: 3.3 g/dL — ABNORMAL LOW (ref 3.5–5.0)
Alkaline Phosphatase: 123 U/L (ref 38–126)
Anion gap: 11 (ref 5–15)
BUN: 22 mg/dL (ref 8–23)
CO2: 29 mmol/L (ref 22–32)
Calcium: 8.4 mg/dL — ABNORMAL LOW (ref 8.9–10.3)
Chloride: 99 mmol/L (ref 98–111)
Creatinine: 5.73 mg/dL (ref 0.44–1.00)
GFR, Est AFR Am: 8 mL/min — ABNORMAL LOW (ref >60)
GFR, Estimated: 7 mL/min — ABNORMAL LOW (ref >60)
Glucose, Bld: 86 mg/dL (ref 70–99)
Potassium: 5.2 mmol/L — ABNORMAL HIGH (ref 3.5–5.1)
Sodium: 139 mmol/L (ref 135–145)
Total Bilirubin: 0.4 mg/dL (ref 0.3–1.2)
Total Protein: 7.1 g/dL (ref 6.5–8.1)

## 2019-01-27 MED ORDER — SODIUM CHLORIDE 0.9 % IV SOLN
Freq: Once | INTRAVENOUS | Status: AC
  Administered 2019-01-27: 10:00:00 via INTRAVENOUS
  Filled 2019-01-27: qty 250

## 2019-01-27 MED ORDER — OXYCODONE HCL 5 MG PO TABS: 5.0000 mg | ORAL_TABLET | Freq: Four times a day (QID) | ORAL | 0 refills | Status: DC | PRN

## 2019-01-27 MED ORDER — SODIUM CHLORIDE 0.9 % IV SOLN
Freq: Once | INTRAVENOUS | Status: DC
  Filled 2019-01-27: qty 250

## 2019-01-27 MED ORDER — SODIUM CHLORIDE 0.9 % IV SOLN
10.0000 mg/m2 | Freq: Once | INTRAVENOUS | Status: AC
  Administered 2019-01-27: 17 mg via INTRAVENOUS
  Filled 2019-01-27: qty 17

## 2019-01-27 MED ORDER — SODIUM CHLORIDE 0.9 % IV SOLN
Freq: Once | INTRAVENOUS | Status: AC
  Administered 2019-01-27: 10:00:00 via INTRAVENOUS
  Filled 2019-01-27: qty 5

## 2019-01-27 MED ORDER — SODIUM CHLORIDE 0.9% FLUSH
10.0000 mL | INTRAVENOUS | Status: DC | PRN
  Administered 2019-01-27: 10 mL
  Filled 2019-01-27: qty 10

## 2019-01-27 MED ORDER — PALONOSETRON HCL INJECTION 0.25 MG/5ML
INTRAVENOUS | Status: AC
  Filled 2019-01-27: qty 5

## 2019-01-27 MED ORDER — SODIUM CHLORIDE 0.9 % IV SOLN
600.0000 mg/m2 | Freq: Once | INTRAVENOUS | Status: AC
  Administered 2019-01-27: 1026 mg via INTRAVENOUS
  Filled 2019-01-27: qty 26.98

## 2019-01-27 MED ORDER — HEPARIN SOD (PORK) LOCK FLUSH 100 UNIT/ML IV SOLN
500.0000 [IU] | Freq: Once | INTRAVENOUS | Status: AC | PRN
  Administered 2019-01-27: 500 [IU]
  Filled 2019-01-27: qty 5

## 2019-01-27 MED ORDER — PALONOSETRON HCL INJECTION 0.25 MG/5ML
0.2500 mg | Freq: Once | INTRAVENOUS | Status: AC
  Administered 2019-01-27: 0.25 mg via INTRAVENOUS

## 2019-01-27 NOTE — Progress Notes (Signed)
[facility]   [phone] [phone]   Clinic Follow up Note   Patient Care Team: Glendon Axe, MD as PCP - General (Family Medicine) [name], MD as PCP - Cardiology (Cardiology) Dwana Melena, MD as Attending Physician (Nephrology) Truitt Merle, MD as Consulting Physician (Hematology) 01/27/2019  CHIEF COMPLAINT: Follow-up intrahepatic cholangiocarcinoma  SUMMARY OF ONCOLOGIC HISTORY: Oncology History  Intrahepatic cholangiocarcinoma ([place])  02/23/2018 Imaging   CT AP W Contrast 02/23/18  IMPRESSION: 1. Large multi-cystic lesion in the central aspect of the liver adjacent to the gallbladder fossa, with several smaller satellite lesions. These are all new compared to prior CT the chest, abdomen and pelvis 08/07/2016, concerning for intrahepatic abscesses. 2. Extensive mural thickening and inflammatory changes in the region of the cecum. This is nonspecific, and could reflect either right-sided diverticulitis, focal area of colitis, or potentially even underlying neoplasm. 3. Cholelithiasis. Gallbladder is nearly completely contracted without surrounding inflammatory changes to suggest an acute cholecystitis at this time. 4. Left-sided nephrolithiasis measuring up to 8 mm in the upper pole collecting system of left kidney. No ureteral stones or findings of urinary tract obstruction. 5. Aortic atherosclerosis.   02/27/2018 Initial Biopsy   Diagnosis 02/27/18  Liver, needle/core biopsy, Right Hepatic Lobe - ADENOCARCINOMA. SEE NOTE.   02/27/2018 Miscellaneous     03/05/2018 Procedure   Colonoscopy 03/05/18  IMPRESSION - Preparation of the colon was fair. - Non-thrombosed external hemorrhoids found on digital rectal exam. - There was significant looping of the colon. - Severe diverticulosis in the recto-sigmoid colon, in the sigmoid colon, in the descending colon, in the transverse colon, at the hepatic flexure, in the ascending colon  and in the cecum. There was no evidence of diverticular bleeding. - A single (solitary) ulcer in the cecum - highly concerning for underlying malignancy. Biopsied. Phlegmonous change is possible, though often in setting of complicated diverticulosis/diverticulitis ulceration would not be as common. - Erythematous mucosa in the transverse colon, at the hepatic flexure, in the ascending colon and in the cecum. Biopsied. - Normal mucosa in the rectum, in the recto-sigmoid colon, in the sigmoid colon and in the descending colon. Biopsied. - Non-bleeding non-thrombosed external and internal hemorrhoids. -----Negative for malignancy    03/11/2018 Initial Diagnosis   Intrahepatic cholangiocarcinoma ([place])   04/02/2018 Imaging   CT CAP W contrast 04/02/18  IMPRESSION: 1. 4 mm subpleural nodule in the periphery of the right lower lobe. This is nonspecific, but strongly favored to represent a benign subpleural lymph node. Attention on follow-up studies is recommended to exclude the possibility of metastatic disease. 2. Diffuse bronchial wall thickening with moderate centrilobular and mild paraseptal emphysema; imaging findings suggestive of underlying COPD. 3. Aortic atherosclerosis, in addition to left main and left anterior descending coronary artery disease. Assessment for potential risk factor modification, dietary therapy or pharmacologic therapy may be warranted, if clinically indicated. 4. Nonobstructive calculi in the upper pole collecting system of left kidney measuring up to 7 mm.    04/27/2018 Cancer Staging   Staging form: Intrahepatic Bile Duct, AJCC 8th Edition - Clinical: Stage IIIB (cT2, cN1, cM0) - Signed by Truitt Merle, MD on 04/27/2018   05/18/2018 -  Chemotherapy   Gemcitabine and Cisplatin every 2 weeksstarting on 05/18/2018, with 50% dose reduction   07/19/2018 Imaging   CT CAP W CONTRAST  IMPRESSION: 1. Dominant inferior liver mass is decreased in size.  Two subcentimeter clustered low-attenuation lesions in the far inferior right liver lobe, not discretely  visualized on the prior noncontrast CT study, can not exclude new small satellite hepatic metastases. 2. Porta hepatis adenopathy is mildly decreased. 3. Small right pulmonary nodules are stable. 4. No additional potential new or progressive metastatic disease. 5. Small pericardial effusion, slightly increased. 6. Cholelithiasis. 7. Moderate diffuse colonic diverticulosis. 8. Aortic Atherosclerosis (ICD10-I70.0) and Emphysema (ICD10-J43.9).    11/02/2018 Imaging   CT CAP WO contrast  IMPRESSION: 1. The dominant right hepatic lobe mass and most of the smaller right hepatic lobe lesions are stable. One of the right hepatic lobe lesions appears to have increased in size, formerly about 1.1 by 1.2 cm and currently 1.9 by 1.5 cm. 2. Two small right lower lobe pulmonary nodules are stable in size. 3. A hypodense right thyroid nodule has enlarged, currently 1.9 by 1.2 cm and formerly 1.1 by 0.7 cm. Consider thyroid ultrasound for further evaluation. 4. Other imaging findings of potential clinical significance: Aortic Atherosclerosis (ICD10-I70.0) and Emphysema (ICD10-J43.9). Coronary atherosclerosis. Mild cardiomegaly. Low-density blood pool suggests anemia. Nonobstructive left nephrolithiasis. Colonic diverticulosis.     CURRENT THERAPY: Gemcitabine and Cisplatin every 2 weeksstartingon 05/18/2018, with dose reduction  INTERVAL HISTORY: Ms. Hayworth returns for follow-up and treatment as scheduled.  She completed cycle 18 of cisplatin and gemcitabine on 01/13/2019.  She was seen in ED for dehydration after HD on 8/28. They are taking less fluid lately and she has not had more difficulty. Denies dizziness on standing. She is eating and drinking. Has mild fatigue. She felt better after RBCs. Neuropathy is better on gabapentin. occasional abdominal pain is well managed with oxycodone, takes  one q2-3 days. She is asking for refill. Denies mucositis, n/v/c/d, cough, chest pain, dyspnea, swelling.    MEDICAL HISTORY:  Past Medical History:  Diagnosis Date   Diverticulitis    Focal segmental glomerulosclerosis    ESRD on MWF HD   Hypertension    intrahepatic cholangio ca dx'd10/2019   Renal insufficiency    dialysis pt MWF   Tobacco abuse     SURGICAL HISTORY: Past Surgical History:  Procedure Laterality Date   AV FISTULA PLACEMENT Left 03/18/2017   Procedure: Brachiocephalic ARTERIOVENOUS (AV) FISTULA CREATION left arm;  Surgeon: Elam Dutch, MD;  Location: [facility];  Service: Vascular;  Laterality: Left;   BIOPSY  03/05/2018   Procedure: BIOPSY;  Surgeon: Irving Copas., MD;  Location: [facility] ENDOSCOPY;  Service: Gastroenterology;;  enteroscopy bx /cytology brushing Greig Castilla bx   COLONOSCOPY WITH PROPOFOL N/A 03/05/2018   Procedure: COLONOSCOPY WITH PROPOFOL;  Surgeon: Irving Copas., MD;  Location: [place];  Service: Gastroenterology;  Laterality: N/A;  Bx, Spot   ENTEROSCOPY N/A 03/05/2018   Procedure: ENTEROSCOPY;  Surgeon: Mansouraty, Telford Nab., MD;  Location: [facility] ENDOSCOPY;  Service: Gastroenterology;  Laterality: N/A;  BX, Brushing, & Spot   INSERTION OF DIALYSIS CATHETER N/A 03/18/2017   Procedure: INSERTION OF DIALYSIS CATHETER;  Surgeon: Elam Dutch, MD;  Location: [place];  Service: Vascular;  Laterality: N/A;   IR GENERIC HISTORICAL  08/15/2016   IR US GUIDE VASC ACCESS RIGHT 08/15/2016 Arne Cleveland, MD [facility]   IR GENERIC HISTORICAL  08/15/2016   IR FLUORO GUIDE CV LINE RIGHT 08/15/2016 Arne Cleveland, MD [facility]   IR IMAGING GUIDED PORT INSERTION  05/06/2018   SUBMUCOSAL INJECTION  03/05/2018   Procedure: SUBMUCOSAL INJECTION;  Surgeon: Irving Copas., MD;  Location: [place];  Service: Gastroenterology;;  spot tatoo   TUBAL LIGATION      I have  reviewed the social history and family  history with the patient and they are unchanged from previous note.  ALLERGIES:  has No Known Allergies.  MEDICATIONS:  Current Outpatient Medications  Medication Sig Dispense Refill   amLODipine (NORVASC) 10 MG tablet      folic acid (FOLVITE) 1 MG tablet Take 1 tablet (1 mg total) by mouth daily. 30 tablet 0   gabapentin (NEURONTIN) 100 MG capsule TAKE 1 CAPSULE (100 MG TOTAL) BY MOUTH AT BEDTIME. 90 capsule 2   lidocaine-prilocaine (EMLA) cream Apply to affected area once 30 g 3   metoprolol tartrate (LOPRESSOR) 25 MG tablet Take 0.5 tablets (12.5 mg total) by mouth 2 (two) times daily. 30 tablet 0   multivitamin (RENA-VIT) TABS tablet Take 1 tablet by mouth at bedtime. 30 tablet 3   Nutritional Supplements (FEEDING SUPPLEMENT, NEPRO CARB STEADY,) LIQD Take 237 mLs by mouth 3 (three) times daily as needed (Supplement). 90 Can 0   ondansetron (ZOFRAN) 8 MG tablet Take 1 tablet (8 mg total) by mouth 2 (two) times daily as needed. Start on the third day after chemotherapy. 30 tablet 1   oxyCODONE (OXY IR/ROXICODONE) 5 MG immediate release tablet Take 1 tablet (5 mg total) by mouth every 6 (six) hours as needed for severe pain. 30 tablet 0   pantoprazole (PROTONIX) 40 MG tablet TAKE 1 TABLET BY MOUTH TWICE A DAY 60 tablet 1   polyethylene glycol (MIRALAX) packet Take 17 g by mouth daily. 30 each 0   prochlorperazine (COMPAZINE) 10 MG tablet Take 1 tablet (10 mg total) by mouth every 6 (six) hours as needed (Nausea or vomiting). 30 tablet 1   senna-docusate (SENOKOT-S) 8.6-50 MG tablet Take 2 tablets by mouth 2 (two) times daily. 30 tablet 0   Vitamin D, Ergocalciferol, (DRISDOL) 50000 units CAPS capsule Take 50,000 Units by mouth once a week.  5   No current facility-administered medications for this visit.    Facility-Administered Medications Ordered in Other Visits  Medication Dose Route Frequency Provider Last Rate Last Dose   sodium chloride flush (NS) 0.9 % injection 10  mL  10 mL Intracatheter PRN Truitt Merle, MD   10 mL at 07/27/18 1745    PHYSICAL EXAMINATION: ECOG PERFORMANCE STATUS: 1 - Symptomatic but completely ambulatory  Vitals:   01/27/19 0847  BP: (!) 151/78  Pulse: 66  Resp: 18  Temp: 98 F (36.7 C)  SpO2: 100%   Filed Weights   01/27/19 0847  Weight: 141 lb 8 oz (64.2 kg)    GENERAL:alert, no distress and comfortable SKIN: no rash  EYES:  sclera clear OROPHARYNX: no thrush or ulcers NECK: without mass LUNGS: clear with normal breathing effort HEART: regular rate & rhythm, no lower extremity edema ABDOMEN:abdomen soft, non-tender and normal bowel sounds Musculoskeletal:no cyanosis of digits NEURO: alert & oriented x 3 with fluent speech, normal gait PAC without erythema   LABORATORY DATA:  I have reviewed the data as listed CBC Latest Ref Rng & Units 01/27/2019 01/14/2019 01/13/2019  WBC 4.0 - 10.5 K/uL 6.3 8.8 7.6  Hemoglobin 12.0 - 15.0 g/dL 8.5(L) 7.8(L) 7.6(L)  Hematocrit 36.0 - 46.0 % 26.4(L) 24.2(L) 22.9(L)  Platelets 150 - 400 K/uL 231 208 167     CMP Latest Ref Rng & Units 01/27/2019 01/14/2019 01/13/2019  Glucose 70 - 99 mg/dL 86 172(H) 97  BUN 8 - 23 mg/dL 22 31(H) 32(H)  Creatinine 0.44 - 1.00 mg/dL 5.73(HH) 4.50(H) 5.77(HH)  Sodium 135 - 145 mmol/L  139 138 137  Potassium 3.5 - 5.1 mmol/L 5.2(H) 3.8 3.8  Chloride 98 - 111 mmol/L 99 94(L) 95(L)  CO2 22 - 32 mmol/L _0 Calcium 8.9 - 10.3 mg/dL 8.4(L) 7.6(L) 8.3(L)  Total Protein 6.5 - 8.1 g/dL 7.1 7.3 7.3  Total Bilirubin 0.3 - 1.2 mg/dL 0.4 0.5 0.4  Alkaline Phos 38 - 126 U/L 123 117 125  AST 15 - 41 U/L 16 21 14(L)  ALT 0 - 44 U/L _1 RADIOGRAPHIC STUDIES: I have personally reviewed the radiological images as listed and agreed with the findings in the report. No results found.   ASSESSMENT & PLAN: Mary Chapman a 73 y.o.femalewith   1. Adenocarcinomainliver, likelyintrahepatic cholangiocarcinoma, cT2N1M0 stage IIIB, unresectable,  MSI-Stable -Initially diagnosed in 02/2018; she was seen by Dr. Cloyd Stagers at [facility] and deemed not a surgical candidate. -She began first line systemic chemotherapy with dose-reduced gemcitabine and cisplatin on 05/18/2018. She required RBC transfusion after cycle 1and cycle 9 (hgb 7.6)but otherwise tolerated well.  -Restaging CT on 07/19/2018 was previously reviewed, showed partial response. She continues chemotherapyQ2 weeks. -Restaging CT on 11/03/18 was previously reviewed by Dr. Burr Medico which showed stable disease in the liver, and mildly increased thyroid nodule. MD recommended to continue chemotherapy with current regimen and restage in 2-3 more months.  -Cisplatin was reduced to 10 mg/m2 with cycle 13 due to neuropathy -Ms. Duet appears stable today. She completed cycle 18 gemcitabine and cisplatin. She continues to tolerate treatment well, with mild fatigue, neuropathy, and anemia requiring periodic transfusion. She remains functional and independent.  -Clinically her abdominal pain improved on chemotherapy and remains adequately controlled. She does not require much oxycodone. Will refill  -she underwent thyroid US which I reviewed with her today, shows poorly defined heterogenous nodule in superior R lobe measures up to 3.1 cm. This could represent pseudo nodule. We discussed option to biopsy. If biopsy reveals malignancy, she would require surgery that would likely put her chemo on hold and her cholangiocarcinoma could progress. She declined biopsy, prefers observation for now.  -labs reviewed, adequate for treatment. She will proceed with cycle 19 cisplatin and gemcitabine today, no dosage adjustment.  -will restage with CT CAP wo contrast after this cycle. Ordered today.  -f/u in 2 weeks to review CT and treatment  2. Productive cough  -intermittent and ongoingsince beginning of this year -CT chestdating back to3/2018 and as recent as 07/2018 shows moderate emphysema. She is a former  smoker.  -no sick contacts or known covid exposure.Wears mask at dialysis. -CT chest in 10/2018 reports emphysema -denies cough today  3. HTN  -Currently on metoprololandamlodipine per Dr. Augustin Coupe -BP has been low range normal lately, 118/76 today -she takes metoprolol BID and amlodipine in the evening.  -stable lately   4. End Stage Renal Disease, on HD MWF -followed bynephrologist Dr. Augustin Coupe -K has been elevated intermittently, up to 6.0 on 6/14; I discussed with Dr. Augustin Coupe and she received a dose of lokelma  -K normal lately, slightly elevated 5.2 today. Monitoring   5. Anemia of chronic diseaseand chemo induced anemia -Due to CKD and malignancy -s/p RBC transfusion after cycle 1and cycle 9 (hgb 7.6 and symptomatic) -Hg dropped to 7.8 after last cycle and she received 1 unit over 3 hours, fatigue improved, Hg 8.5 today  6. Neuropathy -she developed neuropathy in lower legs and feet, she recently started gabapentin 100 mg qHS -improved on gabapentin, stable  7.Goal of  care discussion- treatment is palliative; full code  8. Syncopal event at dialysis on 7/31, dehydration after HD requiring ED visit on 8/28 -most recent episode occurred after removing 3L at HD, since then they are taking off less fluid. She has not had recurrent episode.  -Denies dizziness on standing.  -improved, will monitor   PLAN: -Labs, thyroid US reviewed -Observe thyroid nodules, patient declined biopsy -Proceed with cycle 19 cisplatin and gemcitabine today, no dosage adjustments -Restage after this cycle, CT CAP wo contrast -Continue HD MWF -F/u and next cycle in 2 weeks ([place] appts on T/TH only)  Orders Placed This Encounter  Procedures   CT Abdomen Pelvis Wo Contrast    Standing Status:   Future    Standing Expiration Date:   01/27/2020    Order Specific Question:   ** REASON FOR EXAM (FREE TEXT)    Answer:   intrahepatic cholangiocarcinoma, restaging    Order Specific Question:    Preferred imaging location?    Answer:   [facility]    Order Specific Question:   Is Oral Contrast requested for this exam?    Answer:   No oral contrast    Order Specific Question:   Reason for No Oral Contrast    Answer:   Other    Order Specific Question:   Please answer why no oral contrast is requested    Answer:   ESRD    Order Specific Question:   Radiology Contrast Protocol - do NOT remove file path    Answer:   [email or web address]   CT Chest Wo Contrast    Standing Status:   Future    Standing Expiration Date:   01/27/2020    Order Specific Question:   ** REASON FOR EXAM (FREE TEXT)    Answer:   intrahepatic cholangiocarcinoma, restaging    Order Specific Question:   Preferred imaging location?    Answer:   [facility]    Order Specific Question:   Radiology Contrast Protocol - do NOT remove file path    Answer:   [email or web address]    All questions were answered. The patient knows to call the clinic with any problems, questions or concerns. No barriers to learning was detected. I spent 20 minutes counseling the patient face to face. The total time spent in the appointment was 25 minutes and more than 50% was on counseling and review of test results     Alla Feeling, NP 01/27/19

## 2019-01-27 NOTE — Progress Notes (Signed)
Received critical lab call from lab. Creatine 5.73 [name] NP aware. Pt is ok to treat today.

## 2019-01-27 NOTE — Telephone Encounter (Signed)
Scheduled appt per 9/10 los,

## 2019-01-27 NOTE — Patient Instructions (Signed)
[facility] Discharge Instructions for Patients Receiving Chemotherapy  Today you received the following chemotherapy agents:  Gemzar, Cisplatin  To help prevent nausea and vomiting after your treatment, we encourage you to take your nausea medication as prescribed.   If you develop nausea and vomiting that is not controlled by your nausea medication, call the clinic.   BELOW ARE SYMPTOMS THAT SHOULD BE REPORTED IMMEDIATELY:  *FEVER GREATER THAN 100.5 F  *CHILLS WITH OR WITHOUT FEVER  NAUSEA AND VOMITING THAT IS NOT CONTROLLED WITH YOUR NAUSEA MEDICATION  *UNUSUAL SHORTNESS OF BREATH  *UNUSUAL BRUISING OR BLEEDING  TENDERNESS IN MOUTH AND THROAT WITH OR WITHOUT PRESENCE OF ULCERS  *URINARY PROBLEMS  *BOWEL PROBLEMS  UNUSUAL RASH Items with * indicate a potential emergency and should be followed up as soon as possible.  Feel free to call the clinic should you have any questions or concerns. The clinic phone number is [phone].  Please show the [facility] at check-in to the Emergency Department and triage nurse.

## 2019-01-30 ENCOUNTER — Other Ambulatory Visit: Payer: Self-pay | Admitting: Hematology

## 2019-01-30 DIAGNOSIS — C221 Intrahepatic bile duct carcinoma: Secondary | ICD-10-CM

## 2019-01-31 ENCOUNTER — Telehealth: Payer: Self-pay

## 2019-01-31 NOTE — Telephone Encounter (Signed)
I have called back and discussed with Mary Chapman about restarting ESA (she was on before cancer treatment) for her anemia, to delay her need for blood transfusion. She will discuss with Dr. Augustin Coupe and likely start low dose ESA for her anemia.   Truitt Merle MD

## 2019-01-31 NOTE — Telephone Encounter (Signed)
Jessica Nurse Case Manager with dialysis center is calling wanting to know what Dr. Burr Medico recommends they can do to have bring her hemoglobin up.  She currently received Venofer when needed.  Her [phone]

## 2019-02-09 NOTE — Progress Notes (Signed)
[facility]   [phone] [phone]   Clinic Follow up Note   Patient Care Team: Glendon Axe, MD as PCP - General (Family Medicine) [name], MD as PCP - Cardiology (Cardiology) Dwana Melena, MD as Attending Physician (Nephrology) Truitt Merle, MD as Consulting Physician (Hematology) 02/10/2019  CHIEF COMPLAINT: Intrahepatic cholangiocarcinoma  SUMMARY OF ONCOLOGIC HISTORY: Oncology History  Intrahepatic cholangiocarcinoma ([facility])  02/23/2018 Imaging   CT AP W Contrast 02/23/18  IMPRESSION: 1. Large multi-cystic lesion in the central aspect of the liver adjacent to the gallbladder fossa, with several smaller satellite lesions. These are all new compared to prior CT the chest, abdomen and pelvis 08/07/2016, concerning for intrahepatic abscesses. 2. Extensive mural thickening and inflammatory changes in the region of the cecum. This is nonspecific, and could reflect either right-sided diverticulitis, focal area of colitis, or potentially even underlying neoplasm. 3. Cholelithiasis. Gallbladder is nearly completely contracted without surrounding inflammatory changes to suggest an acute cholecystitis at this time. 4. Left-sided nephrolithiasis measuring up to 8 mm in the upper pole collecting system of left kidney. No ureteral stones or findings of urinary tract obstruction. 5. Aortic atherosclerosis.   02/27/2018 Initial Biopsy   Diagnosis 02/27/18  Liver, needle/core biopsy, Right Hepatic Lobe - ADENOCARCINOMA. SEE NOTE.   02/27/2018 Miscellaneous     03/05/2018 Procedure   Colonoscopy 03/05/18  IMPRESSION - Preparation of the colon was fair. - Non-thrombosed external hemorrhoids found on digital rectal exam. - There was significant looping of the colon. - Severe diverticulosis in the recto-sigmoid colon, in the sigmoid colon, in the descending colon, in the transverse colon, at the hepatic flexure, in the ascending colon and in the  cecum. There was no evidence of diverticular bleeding. - A single (solitary) ulcer in the cecum - highly concerning for underlying malignancy. Biopsied. Phlegmonous change is possible, though often in setting of complicated diverticulosis/diverticulitis ulceration would not be as common. - Erythematous mucosa in the transverse colon, at the hepatic flexure, in the ascending colon and in the cecum. Biopsied. - Normal mucosa in the rectum, in the recto-sigmoid colon, in the sigmoid colon and in the descending colon. Biopsied. - Non-bleeding non-thrombosed external and internal hemorrhoids. -----Negative for malignancy    03/11/2018 Initial Diagnosis   Intrahepatic cholangiocarcinoma ([facility] [facility])   04/02/2018 Imaging   CT CAP W contrast 04/02/18  IMPRESSION: 1. 4 mm subpleural nodule in the periphery of the right lower lobe. This is nonspecific, but strongly favored to represent a benign subpleural lymph node. Attention on follow-up studies is recommended to exclude the possibility of metastatic disease. 2. Diffuse bronchial wall thickening with moderate centrilobular and mild paraseptal emphysema; imaging findings suggestive of underlying COPD. 3. Aortic atherosclerosis, in addition to left main and left anterior descending coronary artery disease. Assessment for potential risk factor modification, dietary therapy or pharmacologic therapy may be warranted, if clinically indicated. 4. Nonobstructive calculi in the upper pole collecting system of left kidney measuring up to 7 mm.    04/27/2018 Cancer Staging   Staging form: Intrahepatic Bile Duct, AJCC 8th Edition - Clinical: Stage IIIB (cT2, cN1, cM0) - Signed by Truitt Merle, MD on 04/27/2018   05/18/2018 -  Chemotherapy   Gemcitabine and Cisplatin every 2 weeksstarting on 05/18/2018, with 50% dose reduction   07/19/2018 Imaging   CT CAP W CONTRAST  IMPRESSION: 1. Dominant inferior liver mass is decreased in size. Two subcentimeter  clustered low-attenuation lesions in the far inferior right liver lobe, not discretely visualized  on the prior noncontrast CT study, can not exclude new small satellite hepatic metastases. 2. Porta hepatis adenopathy is mildly decreased. 3. Small right pulmonary nodules are stable. 4. No additional potential new or progressive metastatic disease. 5. Small pericardial effusion, slightly increased. 6. Cholelithiasis. 7. Moderate diffuse colonic diverticulosis. 8. Aortic Atherosclerosis (ICD10-I70.0) and Emphysema (ICD10-J43.9).    11/02/2018 Imaging   CT CAP WO contrast  IMPRESSION: 1. The dominant right hepatic lobe mass and most of the smaller right hepatic lobe lesions are stable. One of the right hepatic lobe lesions appears to have increased in size, formerly about 1.1 by 1.2 cm and currently 1.9 by 1.5 cm. 2. Two small right lower lobe pulmonary nodules are stable in size. 3. A hypodense right thyroid nodule has enlarged, currently 1.9 by 1.2 cm and formerly 1.1 by 0.7 cm. Consider thyroid ultrasound for further evaluation. 4. Other imaging findings of potential clinical significance: Aortic Atherosclerosis (ICD10-I70.0) and Emphysema (ICD10-J43.9). Coronary atherosclerosis. Mild cardiomegaly. Low-density blood pool suggests anemia. Nonobstructive left nephrolithiasis. Colonic diverticulosis.     CURRENT THERAPY: Gemcitabine and Cisplatin every 2 weeksstartingon 05/18/2018, with dose reduction  INTERVAL HISTORY: Ms. Schutter returns for follow-up and treatment as scheduled.  CT did not get scheduled. She completed another cycle of gem/cisplatin on 01/27/2019. She feels well today, no specific complaints. Fatigue is at baseline, able to function well. Normal appetite. Denies n/v/c/d. Denies worsening abdominal pain. Denies bleeding. Neuropathy is improved on gabapentin. No recent fever, chills, mucositis, cough, chest pain, dyspnea, leg swelling, dizziness, or fall. She does not think  Aranesp was restarted at dialysis yet but they plan to.    MEDICAL HISTORY:  Past Medical History:  Diagnosis Date  . Diverticulitis   . Focal segmental glomerulosclerosis    ESRD on MWF HD  . Hypertension   . intrahepatic cholangio ca dx'd10/2019  . Renal insufficiency    dialysis pt MWF  . Tobacco abuse     SURGICAL HISTORY: Past Surgical History:  Procedure Laterality Date  . AV FISTULA PLACEMENT Left 03/18/2017   Procedure: Brachiocephalic ARTERIOVENOUS (AV) FISTULA CREATION left arm;  Surgeon: Elam Dutch, MD;  Location: [facility];  Service: Vascular;  Laterality: Left;  . BIOPSY  03/05/2018   Procedure: BIOPSY;  Surgeon: Rush Landmark Telford Nab., MD;  Location: [facility];  Service: Gastroenterology;;  enteroscopy bx /cytology brushing Greig Castilla bx  . COLONOSCOPY WITH PROPOFOL N/A 03/05/2018   Procedure: COLONOSCOPY WITH PROPOFOL;  Surgeon: Rush Landmark Telford Nab., MD;  Location: [facility];  Service: Gastroenterology;  Laterality: N/A;  Bx, Spot  . ENTEROSCOPY N/A 03/05/2018   Procedure: ENTEROSCOPY;  Surgeon: Rush Landmark Telford Nab., MD;  Location: [facility];  Service: Gastroenterology;  Laterality: N/A;  BX, Brushing, & Spot  . INSERTION OF DIALYSIS CATHETER N/A 03/18/2017   Procedure: INSERTION OF DIALYSIS CATHETER;  Surgeon: Elam Dutch, MD;  Location: [facility];  Service: Vascular;  Laterality: N/A;  . IR GENERIC HISTORICAL  08/15/2016   IR US GUIDE VASC ACCESS RIGHT 08/15/2016 Arne Cleveland, MD [facility]  . IR GENERIC HISTORICAL  08/15/2016   IR FLUORO GUIDE CV LINE RIGHT 08/15/2016 Arne Cleveland, MD [facility]  . IR IMAGING GUIDED PORT INSERTION  05/06/2018  . SUBMUCOSAL INJECTION  03/05/2018   Procedure: SUBMUCOSAL INJECTION;  Surgeon: Rush Landmark Telford Nab., MD;  Location: [facility];  Service: Gastroenterology;;  spot tatoo  . TUBAL LIGATION      I have reviewed the social history and family history with the patient and they  are unchanged from  previous note.  ALLERGIES:  has No Known Allergies.  MEDICATIONS:  Current Outpatient Medications  Medication Sig Dispense Refill  . amLODipine (NORVASC) 10 MG tablet     . folic acid (FOLVITE) 1 MG tablet Take 1 tablet (1 mg total) by mouth daily. 30 tablet 0  . gabapentin (NEURONTIN) 100 MG capsule TAKE 1 CAPSULE (100 MG TOTAL) BY MOUTH AT BEDTIME. 90 capsule 2  . lidocaine-prilocaine (EMLA) cream Apply to affected area once 30 g 3  . metoprolol tartrate (LOPRESSOR) 25 MG tablet Take 0.5 tablets (12.5 mg total) by mouth 2 (two) times daily. 30 tablet 0  . multivitamin (RENA-VIT) TABS tablet Take 1 tablet by mouth at bedtime. 30 tablet 3  . Nutritional Supplements (FEEDING SUPPLEMENT, NEPRO CARB STEADY,) LIQD Take 237 mLs by mouth 3 (three) times daily as needed (Supplement). 90 Can 0  . ondansetron (ZOFRAN) 8 MG tablet Take 1 tablet (8 mg total) by mouth 2 (two) times daily as needed. Start on the third day after chemotherapy. 30 tablet 1  . oxyCODONE (OXY IR/ROXICODONE) 5 MG immediate release tablet Take 1 tablet (5 mg total) by mouth every 6 (six) hours as needed for severe pain. 30 tablet 0  . pantoprazole (PROTONIX) 40 MG tablet TAKE 1 TABLET BY MOUTH TWICE A DAY 60 tablet 1  . polyethylene glycol (MIRALAX) packet Take 17 g by mouth daily. 30 each 0  . prochlorperazine (COMPAZINE) 10 MG tablet Take 1 tablet (10 mg total) by mouth every 6 (six) hours as needed (Nausea or vomiting). 30 tablet 1  . senna-docusate (SENOKOT-S) 8.6-50 MG tablet Take 2 tablets by mouth 2 (two) times daily. 30 tablet 0  . Vitamin D, Ergocalciferol, (DRISDOL) 50000 units CAPS capsule Take 50,000 Units by mouth once a week.  5   No current facility-administered medications for this visit.    Facility-Administered Medications Ordered in Other Visits  Medication Dose Route Frequency Provider Last Rate Last Dose  . sodium chloride flush (NS) 0.9 % injection 10 mL  10 mL Intracatheter PRN Truitt Merle, MD   10 mL at  07/27/18 1745    PHYSICAL EXAMINATION: ECOG PERFORMANCE STATUS: 1 - Symptomatic but completely ambulatory  Vitals:   02/10/19 0914  BP: (!) 156/81  Pulse: 73  Resp: 18  Temp: 98.5 F (36.9 C)  SpO2: 100%   Filed Weights   02/10/19 0914  Weight: 143 lb 3.2 oz (65 kg)    GENERAL:alert, no distress and comfortable SKIN: no rash  EYES: sclera clear LUNGS: course in bases with normal breathing effort HEART: regular rate & rhythm, no lower extremity edema ABDOMEN: abdomen soft, non-tender and normal bowel sounds. Palpable liver Musculoskeletal:no cyanosis of digits  NEURO: alert & oriented x 3 with fluent speech, normal gait PAC without erythema   LABORATORY DATA:  I have reviewed the data as listed CBC Latest Ref Rng & Units 02/10/2019 01/27/2019 01/14/2019  WBC 4.0 - 10.5 K/uL 6.6 6.3 8.8  Hemoglobin 12.0 - 15.0 g/dL 7.5(L) 8.5(L) 7.8(L)  Hematocrit 36.0 - 46.0 % 23.7(L) 26.4(L) 24.2(L)  Platelets 150 - 400 K/uL 182 231 208     CMP Latest Ref Rng & Units 02/10/2019 01/27/2019 01/14/2019  Glucose 70 - 99 mg/dL 95 86 172(H)  BUN 8 - 23 mg/dL 17 22 31(H)  Creatinine 0.44 - 1.00 mg/dL 5.39(HH) 5.73(HH) 4.50(H)  Sodium 135 - 145 mmol/L 140 139 138  Potassium 3.5 - 5.1 mmol/L 3.8 5.2(H) 3.8  Chloride 98 -  111 mmol/L 100 99 94(L)  CO2 22 - 32 mmol/L 30 29 28   Calcium 8.9 - 10.3 mg/dL 8.1(L) 8.4(L) 7.6(L)  Total Protein 6.5 - 8.1 g/dL 6.9 7.1 7.3  Total Bilirubin 0.3 - 1.2 mg/dL 0.4 0.4 0.5  Alkaline Phos 38 - 126 U/L 121 123 117  AST 15 - 41 U/L 14(L) 16 21  ALT 0 - 44 U/L <6 7 11       RADIOGRAPHIC STUDIES: I have personally reviewed the radiological images as listed and agreed with the findings in the report. No results found.   ASSESSMENT & PLAN: Mary Chapman a 73 y.o.femalewith   1. Adenocarcinomainliver, likelyintrahepatic cholangiocarcinoma, cT2N1M0 stage IIIB, unresectable, MSI-Stable -Initially diagnosed in 02/2018; she was seen by Dr. Cloyd Stagers at [facility] and  deemed not a surgical candidate. -She began first line systemic chemotherapy with dose-reduced gemcitabine and cisplatin on 05/18/2018. She required RBC transfusion after cycle 1and cycle 9 (hgb 7.6)but otherwise tolerated well.  -Restaging CT on 07/19/2018 was previously reviewed, showed partial response. She continues chemotherapyQ2 weeks. -Restaging CT on 11/03/18 was previously reviewed by Dr. Burr Medico which showed stable disease in the liver, and mildly increased thyroid nodule. MD recommended to continue chemotherapy with current regimen and restage in 2-3 more months.  -Cisplatin was reduced to 10 mg/m2 with cycle 13 due to neuropathy -Clinically her abdominal pain improved on chemotherapy and remains adequately controlled. She does not require much oxycodone.  -she underwent thyroid US which shows poorly defined heterogenous nodule in superior R lobe measures up to 3.1 cm. This could represent pseudo nodule. This was previously reviewed, she declined biopsy at this time.  -Ms. Rondeau appears stable. She completed another cycle of cisplatin and gemcitabine. She continues to tolerate treatment well, with stable mild fatigue and neuropathy. Her anemia has worsened, however, requiring more frequent RBC transfusion -labs reviewed, adequate for treatment. Hgb 7.5, she will receive 1 unit RBC over 3 hours on 02/12/19  -she will proceed with next cycle gem/cisplatin today, no dosage adjustment.  -Her CT CAP wo contrast did not get scheduled, will restage after this cycle.  -f/u in 2 weeks to review CT and treatment  2. Productive cough  -intermittent and ongoingsince beginning of this year -CT chestdating back to3/2018 and as recent as 07/2018 shows moderate emphysema. She is a former smoker.  -no sick contacts or known covid exposure.Wears mask at dialysis. -CT chest in 10/2018 reports emphysema -denies cough today  3. HTN  -Currently on metoprololandamlodipine per Dr. Augustin Coupe -BPhas been low  range normal lately, 118/76 today -she takes metoprolol BID and amlodipine in the evening.  -stable lately   4. End Stage Renal Disease, on HD MWF -followed bynephrologist Dr. Augustin Coupe -K has been elevated intermittently, up to 6.0 on 6/14; I discussed with Dr. Augustin Coupe and she received a dose of lokelma  -Knormal lately, monitoring   5. Anemia of chronic diseaseand chemo induced anemia -Due to CKD and malignancy -she has required more frequent RBC transfusion lately. Dr. Burr Medico previously discussed with HD who plans to restart EPO but has not done so yet.  -Hg 7.5 today, will receive RBC 1 unit over 3 hours, fatigue improves after transfusion  6. Neuropathy -she developed neuropathy in lower legs and feet, she recently started gabapentin 100 mg qHS -improved on gabapentin, stable  7.Goal of care discussion- treatment is palliative; full code  8. Syncopal event at dialysis on 7/31, dehydration after HD requiring ED visit on 8/28 -most recent episode occurred after  removing 3L at HD, since then they are taking off less fluid. She has not had recurrent episode.  -Denies dizziness on standing.  -appears resolved, will monitor    PLAN: -Labs reviewed -Proceed with next cycle gemcitabine and cisplatin as planned -Restaging CT CAP wo contrast on 10/2 -1 unit RBC over 3 hours on 9/26 -F/u in 2 weeks    Orders Placed This Encounter  Procedures  . Practitioner attestation of consent    I, the ordering practitioner, attest that I have discussed with the patient the benefits, risks, side effects, alternatives, likelihood of achieving goals and potential problems during recovery for the procedure listed.    Standing Status:   Future    Standing Expiration Date:   02/10/2020    Order Specific Question:   Procedure    Answer:   Blood Product(s)  . Complete patient signature process for consent form    Standing Status:   Future    Standing Expiration Date:   02/10/2020  . Care  order/instruction    Transfuse Parameters    Standing Status:   Future    Standing Expiration Date:   02/10/2020  . Type and screen    Standing Status:   Future    Number of Occurrences:   1    Standing Expiration Date:   02/10/2020   All questions were answered. The patient knows to call the clinic with any problems, questions or concerns. No barriers to learning was detected.     Alla Feeling, NP 02/10/19

## 2019-02-10 ENCOUNTER — Inpatient Hospital Stay (HOSPITAL_BASED_OUTPATIENT_CLINIC_OR_DEPARTMENT_OTHER): Payer: Medicare Other | Admitting: Nurse Practitioner

## 2019-02-10 ENCOUNTER — Inpatient Hospital Stay: Payer: Medicare Other

## 2019-02-10 ENCOUNTER — Encounter: Payer: Self-pay | Admitting: Nurse Practitioner

## 2019-02-10 ENCOUNTER — Other Ambulatory Visit: Payer: Self-pay

## 2019-02-10 VITALS — BP 156/81 | HR 73 | Temp 98.5°F | Resp 18 | Ht 66.0 in | Wt 143.2 lb

## 2019-02-10 DIAGNOSIS — C221 Intrahepatic bile duct carcinoma: Secondary | ICD-10-CM | POA: Diagnosis not present

## 2019-02-10 DIAGNOSIS — D649 Anemia, unspecified: Secondary | ICD-10-CM | POA: Diagnosis not present

## 2019-02-10 DIAGNOSIS — Z5111 Encounter for antineoplastic chemotherapy: Secondary | ICD-10-CM | POA: Diagnosis not present

## 2019-02-10 DIAGNOSIS — Z95828 Presence of other vascular implants and grafts: Secondary | ICD-10-CM

## 2019-02-10 LAB — CBC WITH DIFFERENTIAL (CANCER CENTER ONLY)
Abs Immature Granulocytes: 0.02 10*3/uL (ref 0.00–0.07)
Basophils Absolute: 0 10*3/uL (ref 0.0–0.1)
Basophils Relative: 0 %
Eosinophils Absolute: 0.3 10*3/uL (ref 0.0–0.5)
Eosinophils Relative: 4 %
HCT: 23.7 % — ABNORMAL LOW (ref 36.0–46.0)
Hemoglobin: 7.5 g/dL — ABNORMAL LOW (ref 12.0–15.0)
Immature Granulocytes: 0 %
Lymphocytes Relative: 18 %
Lymphs Abs: 1.2 10*3/uL (ref 0.7–4.0)
MCH: 33.3 pg (ref 26.0–34.0)
MCHC: 31.6 g/dL (ref 30.0–36.0)
MCV: 105.3 fL — ABNORMAL HIGH (ref 80.0–100.0)
Monocytes Absolute: 0.9 10*3/uL (ref 0.1–1.0)
Monocytes Relative: 14 %
Neutro Abs: 4.2 10*3/uL (ref 1.7–7.7)
Neutrophils Relative %: 64 %
Platelet Count: 182 10*3/uL (ref 150–400)
RBC: 2.25 MIL/uL — ABNORMAL LOW (ref 3.87–5.11)
RDW: 15.2 % (ref 11.5–15.5)
WBC Count: 6.6 10*3/uL (ref 4.0–10.5)
nRBC: 0 % (ref 0.0–0.2)

## 2019-02-10 LAB — CMP (CANCER CENTER ONLY)
ALT: <6 U/L (ref 0–44)
AST: 14 U/L — ABNORMAL LOW (ref 15–41)
Albumin: 3.1 g/dL — ABNORMAL LOW (ref 3.5–5.0)
Alkaline Phosphatase: 121 U/L (ref 38–126)
Anion gap: 10 (ref 5–15)
BUN: 17 mg/dL (ref 8–23)
CO2: 30 mmol/L (ref 22–32)
Calcium: 8.1 mg/dL — ABNORMAL LOW (ref 8.9–10.3)
Chloride: 100 mmol/L (ref 98–111)
Creatinine: 5.39 mg/dL (ref 0.44–1.00)
GFR, Est AFR Am: 9 mL/min — ABNORMAL LOW (ref >60)
GFR, Estimated: 7 mL/min — ABNORMAL LOW (ref >60)
Glucose, Bld: 95 mg/dL (ref 70–99)
Potassium: 3.8 mmol/L (ref 3.5–5.1)
Sodium: 140 mmol/L (ref 135–145)
Total Bilirubin: 0.4 mg/dL (ref 0.3–1.2)
Total Protein: 6.9 g/dL (ref 6.5–8.1)

## 2019-02-10 MED ORDER — SODIUM CHLORIDE 0.9 % IV SOLN
Freq: Once | INTRAVENOUS | Status: AC
  Administered 2019-02-10: 10:00:00 via INTRAVENOUS
  Filled 2019-02-10: qty 250

## 2019-02-10 MED ORDER — SODIUM CHLORIDE 0.9 % IV SOLN
600.0000 mg/m2 | Freq: Once | INTRAVENOUS | Status: AC
  Administered 2019-02-10: 11:00:00 1026 mg via INTRAVENOUS
  Filled 2019-02-10: qty 26.98

## 2019-02-10 MED ORDER — SODIUM CHLORIDE 0.9% FLUSH
10.0000 mL | INTRAVENOUS | Status: DC | PRN
  Administered 2019-02-10: 10 mL
  Filled 2019-02-10: qty 10

## 2019-02-10 MED ORDER — PALONOSETRON HCL INJECTION 0.25 MG/5ML
0.2500 mg | Freq: Once | INTRAVENOUS | Status: AC
  Administered 2019-02-10: 0.25 mg via INTRAVENOUS

## 2019-02-10 MED ORDER — PALONOSETRON HCL INJECTION 0.25 MG/5ML
INTRAVENOUS | Status: AC
  Filled 2019-02-10: qty 5

## 2019-02-10 MED ORDER — HEPARIN SOD (PORK) LOCK FLUSH 100 UNIT/ML IV SOLN
500.0000 [IU] | Freq: Once | INTRAVENOUS | Status: AC | PRN
  Administered 2019-02-10: 13:00:00 500 [IU]
  Filled 2019-02-10: qty 5

## 2019-02-10 MED ORDER — SODIUM CHLORIDE 0.9 % IV SOLN
10.0000 mg/m2 | Freq: Once | INTRAVENOUS | Status: AC
  Administered 2019-02-10: 17 mg via INTRAVENOUS
  Filled 2019-02-10: qty 17

## 2019-02-10 MED ORDER — SODIUM CHLORIDE 0.9 % IV SOLN
Freq: Once | INTRAVENOUS | Status: AC
  Administered 2019-02-10: 10:00:00 via INTRAVENOUS
  Filled 2019-02-10: qty 5

## 2019-02-10 NOTE — Progress Notes (Signed)
Ok to treat today w/ Hgb 7.5. Pt to receive 1 unit of blood on Saturday per Lanora Manis NP

## 2019-02-10 NOTE — Patient Instructions (Signed)
[facility] Discharge Instructions for Patients Receiving Chemotherapy  Today you received the following chemotherapy agents:  Gemzar, Cisplatin  To help prevent nausea and vomiting after your treatment, we encourage you to take your nausea medication as prescribed.   If you develop nausea and vomiting that is not controlled by your nausea medication, call the clinic.   BELOW ARE SYMPTOMS THAT SHOULD BE REPORTED IMMEDIATELY:  *FEVER GREATER THAN 100.5 F  *CHILLS WITH OR WITHOUT FEVER  NAUSEA AND VOMITING THAT IS NOT CONTROLLED WITH YOUR NAUSEA MEDICATION  *UNUSUAL SHORTNESS OF BREATH  *UNUSUAL BRUISING OR BLEEDING  TENDERNESS IN MOUTH AND THROAT WITH OR WITHOUT PRESENCE OF ULCERS  *URINARY PROBLEMS  *BOWEL PROBLEMS  UNUSUAL RASH Items with * indicate a potential emergency and should be followed up as soon as possible.  Feel free to call the clinic should you have any questions or concerns. The clinic phone number is [phone].  Please show the [facility] at check-in to the Emergency Department and triage nurse.

## 2019-02-10 NOTE — Progress Notes (Signed)
Lab called with critical value Creatinine 5.39. Cira Rue, NP made aware.

## 2019-02-11 ENCOUNTER — Telehealth: Payer: Self-pay | Admitting: Nurse Practitioner

## 2019-02-11 ENCOUNTER — Other Ambulatory Visit: Payer: Self-pay | Admitting: *Deleted

## 2019-02-11 DIAGNOSIS — D649 Anemia, unspecified: Secondary | ICD-10-CM

## 2019-02-11 LAB — PREPARE RBC (CROSSMATCH)

## 2019-02-11 LAB — CANCER ANTIGEN 19-9: CA 19-9: 37 U/mL — ABNORMAL HIGH (ref 0–35)

## 2019-02-11 NOTE — Telephone Encounter (Signed)
Scheduled appt per 9/24 los.

## 2019-02-12 ENCOUNTER — Other Ambulatory Visit: Payer: Self-pay

## 2019-02-12 ENCOUNTER — Inpatient Hospital Stay: Payer: Medicare Other

## 2019-02-12 DIAGNOSIS — D649 Anemia, unspecified: Secondary | ICD-10-CM

## 2019-02-12 DIAGNOSIS — Z5111 Encounter for antineoplastic chemotherapy: Secondary | ICD-10-CM | POA: Diagnosis not present

## 2019-02-12 MED ORDER — SODIUM CHLORIDE 0.9% FLUSH
3.0000 mL | INTRAVENOUS | Status: DC | PRN
  Filled 2019-02-12: qty 10

## 2019-02-12 MED ORDER — SODIUM CHLORIDE 0.9% FLUSH
10.0000 mL | INTRAVENOUS | Status: AC | PRN
  Administered 2019-02-12: 10:00:00 10 mL
  Filled 2019-02-12: qty 10

## 2019-02-12 MED ORDER — HEPARIN SOD (PORK) LOCK FLUSH 100 UNIT/ML IV SOLN
500.0000 [IU] | Freq: Every day | INTRAVENOUS | Status: AC | PRN
  Administered 2019-02-12: 10:00:00 500 [IU]
  Filled 2019-02-12: qty 5

## 2019-02-12 MED ORDER — SODIUM CHLORIDE 0.9% IV SOLUTION
250.0000 mL | Freq: Once | INTRAVENOUS | Status: AC
  Administered 2019-02-12: 08:00:00 250 mL via INTRAVENOUS
  Filled 2019-02-12: qty 250

## 2019-02-12 NOTE — Patient Instructions (Signed)
Coronavirus (COVID-19) Are you at risk? ° °Are you at risk for the Coronavirus (COVID-19)? ° °To be considered HIGH RISK for Coronavirus (COVID-19), you have to meet the following criteria: ° °• Traveled to [place], [place], [place], [place] or [place]; or in the [place] to [place], [place], [place], or [place]; and have fever, cough, and shortness of breath within the last 2 weeks of travel OR °• Been in close contact with a person diagnosed with COVID-19 within the last 2 weeks and have fever, cough, and shortness of breath °• IF YOU DO NOT MEET THESE CRITERIA, YOU ARE CONSIDERED LOW RISK FOR COVID-19. ° °What to do if you are HIGH RISK for COVID-19? ° °• If you are having a medical emergency, call 911. °• Seek medical care right away. Before you go to a doctor’s office, urgent care or emergency department, call ahead and tell them about your recent travel, contact with someone diagnosed with COVID-19, and your symptoms. You should receive instructions from your physician’s office regarding next steps of care.  °• When you arrive at healthcare provider, tell the healthcare staff immediately you have returned from visiting [place], [place], [place], [place] or [place]; or traveled in the [place] to [place], [place], [place], or [place]; in the last two weeks or you have been in close contact with a person diagnosed with COVID-19 in the last 2 weeks.   °• Tell the health care staff about your symptoms: fever, cough and shortness of breath. °• After you have been seen by a medical provider, you will be either: °o Tested for (COVID-19) and discharged home on quarantine except to seek medical care if symptoms worsen, and asked to  °- Stay home and avoid contact with others until you get your results (4-5 days)  °- Avoid travel on public transportation if possible (such as bus, train, or airplane) or °o Sent to the Emergency Department by EMS for evaluation, COVID-19 testing, and possible  admission depending on your condition and test results. ° °What to do if you are LOW RISK for COVID-19? ° °Reduce your risk of any infection by using the same precautions used for avoiding the common cold or flu:  °• Wash your hands often with soap and warm water for at least 20 seconds.  If soap and water are not readily available, use an alcohol-based hand sanitizer with at least 60% alcohol.  °• If coughing or sneezing, cover your mouth and nose by coughing or sneezing into the elbow areas of your shirt or coat, into a tissue or into your sleeve (not your hands). °• Avoid shaking hands with others and consider head nods or verbal greetings only. °• Avoid touching your eyes, nose, or mouth with unwashed hands.  °• Avoid close contact with people who are sick. °• Avoid places or events with large numbers of people in one location, like concerts or sporting events. °• Carefully consider travel plans you have or are making. °• If you are planning any travel outside or inside the [place], visit the CDC’s Travelers’ Health webpage for the latest health notices. °• If you have some symptoms but not all symptoms, continue to monitor at home and seek medical attention if your symptoms worsen. °• If you are having a medical emergency, call 911. ° ° °ADDITIONAL HEALTHCARE OPTIONS FOR PATIENTS ° °[facility] / e-Visit: [email or web address] °        °[facility]: [phone] ° °[facility]: [phone]          °         °[facility]: [phone]  ° ° °Blood Transfusion, Adult, Care After °This sheet gives you information about how to care for yourself after your procedure. Your health care provider may also give you more specific instructions. If you have problems or questions, contact your health care provider. °What can I expect after the procedure? °After your procedure, it is common to have: °· Bruising and soreness where the IV tube  was inserted. °· Headache. °Follow these instructions at home: ° °· Take over-the-counter and prescription medicines only as told by your health care provider. °· Return to your normal activities as told by your health care provider. °· Follow instructions from your health care provider about how to take care of your IV insertion site. Make sure you: °? Wash your hands with soap and water before you change your bandage (dressing). If soap and water are not available, use hand sanitizer. °? Change your dressing as told by your health care provider. °· Check your IV insertion site every day for signs of infection. Check for: °? More redness, swelling, or pain. °? More fluid or blood. °? Warmth. °? Pus or a bad smell. °Contact a health care provider if: °· You have more redness, swelling, or pain around the IV insertion site. °· You have more fluid or blood coming from the IV insertion site. °· Your IV insertion site feels warm to the touch. °· You have pus or a bad smell coming from the IV insertion site. °· Your urine turns pink, red, or brown. °· You feel weak after doing your normal activities. °Get help right away if: °· You have signs of a serious allergic or immune system reaction, including: °? Itchiness. °? Hives. °? Trouble breathing. °? Anxiety. °? Chest or lower back pain. °? Fever, flushing, and chills. °? Rapid pulse. °? Rash. °? Diarrhea. °? Vomiting. °? Dark urine. °? Serious headache. °? Dizziness. °? Stiff neck. °? Yellow coloration of the face or the white parts of the eyes (jaundice). °This information is not intended to replace advice given to you by your health care provider. Make sure you discuss any questions you have with your health care provider. °Document Released: 05/26/2014 Document Revised: 03/02/2017 Document Reviewed: 11/19/2015 °Elsevier Patient Education © 2020 [employer] ° °

## 2019-02-13 LAB — TYPE AND SCREEN
ABO/RH(D): O POS
Antibody Screen: NEGATIVE
Unit division: 0

## 2019-02-13 LAB — BPAM RBC
Blood Product Expiration Date: 202010262359
ISSUE DATE / TIME: 202009260811
Unit Type and Rh: 5100

## 2019-02-18 ENCOUNTER — Ambulatory Visit (HOSPITAL_COMMUNITY): Admission: RE | Admit: 2019-02-18 | Payer: Medicare Other | Source: Ambulatory Visit

## 2019-02-21 NOTE — Progress Notes (Signed)
[facility]   [phone] [phone]   Clinic Follow up Note   Patient Care Team: Glendon Axe, MD as PCP - General (Family Medicine) [name], MD as PCP - Cardiology (Cardiology) Dwana Melena, MD as Attending Physician (Nephrology) Truitt Merle, MD as Consulting Physician (Hematology)  Date of Service:  02/24/2019  CHIEF COMPLAINT: f/u intrahepatic cholangiocarcinoma  SUMMARY OF ONCOLOGIC HISTORY: Oncology History  Intrahepatic cholangiocarcinoma ([place])  02/23/2018 Imaging   CT AP W Contrast 02/23/18  IMPRESSION: 1. Large multi-cystic lesion in the central aspect of the liver adjacent to the gallbladder fossa, with several smaller satellite lesions. These are all new compared to prior CT the chest, abdomen and pelvis 08/07/2016, concerning for intrahepatic abscesses. 2. Extensive mural thickening and inflammatory changes in the region of the cecum. This is nonspecific, and could reflect either right-sided diverticulitis, focal area of colitis, or potentially even underlying neoplasm. 3. Cholelithiasis. Gallbladder is nearly completely contracted without surrounding inflammatory changes to suggest an acute cholecystitis at this time. 4. Left-sided nephrolithiasis measuring up to 8 mm in the upper pole collecting system of left kidney. No ureteral stones or findings of urinary tract obstruction. 5. Aortic atherosclerosis.   02/27/2018 Initial Biopsy   Diagnosis 02/27/18  Liver, needle/core biopsy, Right Hepatic Lobe - ADENOCARCINOMA. SEE NOTE.   02/27/2018 Miscellaneous     03/05/2018 Procedure   Colonoscopy 03/05/18  IMPRESSION - Preparation of the colon was fair. - Non-thrombosed external hemorrhoids found on digital rectal exam. - There was significant looping of the colon. - Severe diverticulosis in the recto-sigmoid colon, in the sigmoid colon, in the descending colon, in the transverse colon, at the hepatic flexure, in the  ascending colon and in the cecum. There was no evidence of diverticular bleeding. - A single (solitary) ulcer in the cecum - highly concerning for underlying malignancy. Biopsied. Phlegmonous change is possible, though often in setting of complicated diverticulosis/diverticulitis ulceration would not be as common. - Erythematous mucosa in the transverse colon, at the hepatic flexure, in the ascending colon and in the cecum. Biopsied. - Normal mucosa in the rectum, in the recto-sigmoid colon, in the sigmoid colon and in the descending colon. Biopsied. - Non-bleeding non-thrombosed external and internal hemorrhoids. -----Negative for malignancy    03/11/2018 Initial Diagnosis   Intrahepatic cholangiocarcinoma ([place])   04/02/2018 Imaging   CT CAP W contrast 04/02/18  IMPRESSION: 1. 4 mm subpleural nodule in the periphery of the right lower lobe. This is nonspecific, but strongly favored to represent a benign subpleural lymph node. Attention on follow-up studies is recommended to exclude the possibility of metastatic disease. 2. Diffuse bronchial wall thickening with moderate centrilobular and mild paraseptal emphysema; imaging findings suggestive of underlying COPD. 3. Aortic atherosclerosis, in addition to left main and left anterior descending coronary artery disease. Assessment for potential risk factor modification, dietary therapy or pharmacologic therapy may be warranted, if clinically indicated. 4. Nonobstructive calculi in the upper pole collecting system of left kidney measuring up to 7 mm.    04/27/2018 Cancer Staging   Staging form: Intrahepatic Bile Duct, AJCC 8th Edition - Clinical: Stage IIIB (cT2, cN1, cM0) - Signed by Truitt Merle, MD on 04/27/2018   05/18/2018 - 02/23/2019 Chemotherapy   Gemcitabine and Cisplatin every 2 weeksstarting on 05/18/2018, with 50% dose reduction   07/19/2018 Imaging   CT CAP W CONTRAST  IMPRESSION: 1. Dominant inferior liver mass is  decreased in size. Two subcentimeter clustered low-attenuation lesions in the far inferior  right liver lobe, not discretely visualized on the prior noncontrast CT study, can not exclude new small satellite hepatic metastases. 2. Porta hepatis adenopathy is mildly decreased. 3. Small right pulmonary nodules are stable. 4. No additional potential new or progressive metastatic disease. 5. Small pericardial effusion, slightly increased. 6. Cholelithiasis. 7. Moderate diffuse colonic diverticulosis. 8. Aortic Atherosclerosis (ICD10-I70.0) and Emphysema (ICD10-J43.9).    11/02/2018 Imaging   CT CAP WO contrast  IMPRESSION: 1. The dominant right hepatic lobe mass and most of the smaller right hepatic lobe lesions are stable. One of the right hepatic lobe lesions appears to have increased in size, formerly about 1.1 by 1.2 cm and currently 1.9 by 1.5 cm. 2. Two small right lower lobe pulmonary nodules are stable in size. 3. A hypodense right thyroid nodule has enlarged, currently 1.9 by 1.2 cm and formerly 1.1 by 0.7 cm. Consider thyroid ultrasound for further evaluation. 4. Other imaging findings of potential clinical significance: Aortic Atherosclerosis (ICD10-I70.0) and Emphysema (ICD10-J43.9). Coronary atherosclerosis. Mild cardiomegaly. Low-density blood pool suggests anemia. Nonobstructive left nephrolithiasis. Colonic diverticulosis.   02/24/2019 -  Chemotherapy   The patient had palonosetron (ALOXI) injection 0.25 mg, 0.25 mg, Intravenous,  Once, 1 of 4 cycles Administration: 0.25 mg (02/24/2019) leucovorin 696 mg in dextrose 5 % 250 mL infusion, 400 mg/m2 = 696 mg, Intravenous,  Once, 1 of 4 cycles Administration: 696 mg (02/24/2019) oxaliplatin (ELOXATIN) 85 mg in dextrose 5 % 500 mL chemo infusion, 50 mg/m2 = 85 mg (83.3 % of original dose 60 mg/m2), Intravenous,  Once, 1 of 4 cycles Dose modification: 60 mg/m2 (original dose 60 mg/m2, Cycle 1, Reason: Provider Judgment), 50 mg/m2  (original dose 60 mg/m2, Cycle 1, Reason: Provider Judgment) Administration: 85 mg (02/24/2019) fluorouracil (ADRUCIL) 3,500 mg in sodium chloride 0.9 % 80 mL chemo infusion, 2,000 mg/m2 = 3,500 mg (100 % of original dose 2,000 mg/m2), Intravenous, 1 Day/Dose, 1 of 4 cycles Dose modification: 2,000 mg/m2 (original dose 2,000 mg/m2, Cycle 1, Reason: Provider Judgment, Comment: PT ON HD) Administration: 3,500 mg (02/24/2019)  for chemotherapy treatment.       CURRENT THERAPY:  Gemcitabine and Cisplatin every 2 weeksstartingon 05/18/2018, with dose reduction  INTERVAL HISTORY:  Mary Chapman is here for a follow up and treatment. She presents to the clinic alone.  She has noticed more tenderness in her abdomen, with vague discomfort, no significant nausea.  Her appetite and oral intake has been decent.  She has had some allergy symptoms lately, with nasal congestion, mild dry cough, no sputum production, no fever she is tolerating dialysis well, no episodes of dizziness blood pressure lately.  Review of systems otherwise negative.   MEDICAL HISTORY:  Past Medical History:  Diagnosis Date  . Diverticulitis   . Focal segmental glomerulosclerosis    ESRD on MWF HD  . Hypertension   . intrahepatic cholangio ca dx'd10/2019  . Renal insufficiency    dialysis pt MWF  . Tobacco abuse     SURGICAL HISTORY: Past Surgical History:  Procedure Laterality Date  . AV FISTULA PLACEMENT Left 03/18/2017   Procedure: Brachiocephalic ARTERIOVENOUS (AV) FISTULA CREATION left arm;  Surgeon: Elam Dutch, MD;  Location: [facility];  Service: Vascular;  Laterality: Left;  . BIOPSY  03/05/2018   Procedure: BIOPSY;  Surgeon: Rush Landmark Telford Nab., MD;  Location: [facility];  Service: Gastroenterology;;  enteroscopy bx /cytology brushing Greig Castilla bx  . COLONOSCOPY WITH PROPOFOL N/A 03/05/2018   Procedure: COLONOSCOPY WITH PROPOFOL;  Surgeon: Irving Copas.,  MD;  Location: [facility];  Service:  Gastroenterology;  Laterality: N/A;  Bx, Spot  . ENTEROSCOPY N/A 03/05/2018   Procedure: ENTEROSCOPY;  Surgeon: Rush Landmark Telford Nab., MD;  Location: [facility];  Service: Gastroenterology;  Laterality: N/A;  BX, Brushing, & Spot  . INSERTION OF DIALYSIS CATHETER N/A 03/18/2017   Procedure: INSERTION OF DIALYSIS CATHETER;  Surgeon: Elam Dutch, MD;  Location: [facility];  Service: Vascular;  Laterality: N/A;  . IR GENERIC HISTORICAL  08/15/2016   IR US GUIDE VASC ACCESS RIGHT 08/15/2016 Arne Cleveland, MD [facility]  . IR GENERIC HISTORICAL  08/15/2016   IR FLUORO GUIDE CV LINE RIGHT 08/15/2016 Arne Cleveland, MD [facility]  . IR IMAGING GUIDED PORT INSERTION  05/06/2018  . SUBMUCOSAL INJECTION  03/05/2018   Procedure: SUBMUCOSAL INJECTION;  Surgeon: Rush Landmark Telford Nab., MD;  Location: [facility];  Service: Gastroenterology;;  spot tatoo  . TUBAL LIGATION      I have reviewed the social history and family history with the patient and they are unchanged from previous note.  ALLERGIES:  has No Known Allergies.  MEDICATIONS:  Current Outpatient Medications  Medication Sig Dispense Refill  . amLODipine (NORVASC) 10 MG tablet     . folic acid (FOLVITE) 1 MG tablet Take 1 tablet (1 mg total) by mouth daily. 30 tablet 0  . gabapentin (NEURONTIN) 100 MG capsule TAKE 1 CAPSULE (100 MG TOTAL) BY MOUTH AT BEDTIME. 90 capsule 2  . metoprolol tartrate (LOPRESSOR) 25 MG tablet Take 0.5 tablets (12.5 mg total) by mouth 2 (two) times daily. 30 tablet 0  . multivitamin (RENA-VIT) TABS tablet Take 1 tablet by mouth at bedtime. 30 tablet 3  . Nutritional Supplements (FEEDING SUPPLEMENT, NEPRO CARB STEADY,) LIQD Take 237 mLs by mouth 3 (three) times daily as needed (Supplement). 90 Can 0  . oxyCODONE (OXY IR/ROXICODONE) 5 MG immediate release tablet Take 1 tablet (5 mg total) by mouth every 6 (six) hours as needed for severe pain. 30 tablet 0  . pantoprazole (PROTONIX) 40 MG tablet TAKE 1  TABLET BY MOUTH TWICE A DAY 60 tablet 1  . polyethylene glycol (MIRALAX) packet Take 17 g by mouth daily. 30 each 0  . senna-docusate (SENOKOT-S) 8.6-50 MG tablet Take 2 tablets by mouth 2 (two) times daily. 30 tablet 0  . Vitamin D, Ergocalciferol, (DRISDOL) 50000 units CAPS capsule Take 50,000 Units by mouth once a week.  5   No current facility-administered medications for this visit.    Facility-Administered Medications Ordered in Other Visits  Medication Dose Route Frequency Provider Last Rate Last Dose  . fluorouracil (ADRUCIL) 3,500 mg in sodium chloride 0.9 % 80 mL chemo infusion  2,000 mg/m2 (Treatment Plan Recorded) Intravenous 1 day or 1 dose Truitt Merle, MD   3,500 mg at 02/24/19 1413    PHYSICAL EXAMINATION: ECOG PERFORMANCE STATUS: 1 - Symptomatic but completely ambulatory  Vitals:   02/24/19 0924  BP: (!) 168/90  Pulse: 74  Resp: 17  Temp: 98.7 F (37.1 C)  SpO2: 100%   Filed Weights   02/24/19 0924  Weight: 147 lb 8 oz (66.9 kg)   GENERAL:alert, no distress and comfortable SKIN: skin color, texture, turgor are normal, no rashes or significant lesions EYES: normal, Conjunctiva are pink and non-injected, sclera clear NECK: supple, thyroid normal size, non-tender, without nodularity LYMPH:  no palpable lymphadenopathy in the cervical, axillary  LUNGS: clear to auscultation and percussion with normal breathing effort HEART: regular rate & rhythm and no  murmurs and no lower extremity edema ABDOMEN:abdomen soft, (+) mild hepatomegaly and tenderness Musculoskeletal:no cyanosis of digits and no clubbing  NEURO: alert & oriented x 3 with fluent speech, no focal motor/sensory deficits  LABORATORY DATA:  I have reviewed the data as listed CBC Latest Ref Rng & Units 02/24/2019 02/10/2019 01/27/2019  WBC 4.0 - 10.5 K/uL 6.6 6.6 6.3  Hemoglobin 12.0 - 15.0 g/dL 8.5(L) 7.5(L) 8.5(L)  Hematocrit 36.0 - 46.0 % 26.5(L) 23.7(L) 26.4(L)  Platelets 150 - 400 K/uL 240 182 231      CMP Latest Ref Rng & Units 02/24/2019 02/10/2019 01/27/2019  Glucose 70 - 99 mg/dL 94 95 86  BUN 8 - 23 mg/dL 28(H) 17 22  Creatinine 0.44 - 1.00 mg/dL 5.45(HH) 5.39(HH) 5.73(HH)  Sodium 135 - 145 mmol/L 138 140 139  Potassium 3.5 - 5.1 mmol/L 3.8 3.8 5.2(H)  Chloride 98 - 111 mmol/L 100 100 99  CO2 22 - 32 mmol/L 29 30 29   Calcium 8.9 - 10.3 mg/dL 7.8(L) 8.1(L) 8.4(L)  Total Protein 6.5 - 8.1 g/dL 6.6 6.9 7.1  Total Bilirubin 0.3 - 1.2 mg/dL 0.4 0.4 0.4  Alkaline Phos 38 - 126 U/L 138(H) 121 123  AST 15 - 41 U/L 14(L) 14(L) 16  ALT 0 - 44 U/L 8 <6 7      RADIOGRAPHIC STUDIES: I have personally reviewed the radiological images as listed and agreed with the findings in the report. No results found.   ASSESSMENT & PLAN:  Janette Harvie is a 73 y.o. female with   1. Adenocarcinomainliver, likelyintrahepatic cholangiocarcinoma, cT2N1M0 stage IIIB, unresectable, MS-Stable -Diagnosed in 02/2018. She is not eligible for surgery due to tumor size and location.She was seen by [facility] Cl[facility] -she has been onfirst lineCisplatin and Gemcitabineevery 2 weekssince 12/2019with dose reduction. -She is tolerating chemotherapy moderately will, with mild fatigue,neuropathy, moderate anemia that required blood transfusion but noothersevere otherside affects. -Her FO results show BRCA1 mutation.She is a candidate for PARP inhibitor. Her germline test was negative for BRCA mutation. this will like be her third or 4th line therapy  -I have personally reviewed her restaging CT scan images with patient, and discussed the findings.  Compared to her last scan, she has had cancer progression in the liver no other new lesions. -I recommend change her chemotherapy to FOLFOX, due to her end-stage renal disease and dialysis, mild neuropathy, I will reduce her oxaliplatin dose to 69m/m2. And 5-fu to 20043mm2.  --Chemotherapy consent: Side effects including but does not not limited to, fatigue, nausea,  vomiting, diarrhea, hair loss, cold sensitivity and neuropathy, fluid retention, renal and kidney dysfunction, neutropenic fever, needed for blood transfusion, bleeding, were discussed with patient in great detail. She agrees to proceed. -The goal of therapy is palliative to prolong life and improve her quality of life -She has developed more discomfort and tenderness in the right upper quadrant, related to her cancer progression in the liver -She is normally on dialysis Monday Wednesday and Friday, will move her Friday dialysis to Saturday after pump DC when she is on FOLFOX, she is agreeable  -will start first cycle FOLFOX today   2. HTN  -Currently on metoprolol. -stable   3. End Stage Renal Disease, on HD MWF -Treated with dialysis  -f/u with nephrologist Dr. LiAugustin Coupeshe is tolerating HD well -We called her dialysis center to move her dialysis from tomorrow to Saturday after pump DC  4. Anemia of chronic diseaseand chemo induced anemia -Due to CKD and  malignancy -She has been received blood transfusion intermittently due to anemia -I have restarted her on EPO at her dialysis center due to her worsening anemia  -stable overall   5.Goal of care discussion  -The patient understands the goal of care is palliative, to prolong his life -she is full code now  6.  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  -mild and stable, will watch closely when she is on oxaliplatin   Plan -Restaging CT scan reviewed, unfortunately she has had cancer progression in her liver -We will stop cisplatin and gemcitabine.  Change to second line FOLFOX with dose reduction, starting today -f/u in 2 weeks before next cycle chemo  -I talked to her  daughter during her visit     No problem-specific Assessment & Plan notes found for this encounter.   No orders of the defined types were placed in this encounter.  All questions were answered. The patient knows to call the clinic with any problems, questions or concerns. No barriers to learning was detected. I spent 25 minutes counseling the patient face to face. The total time spent in the appointment was 30 minutes and more than 50% was on counseling and review of test results     YaTruitt MerleMD 02/24/2019   I, AmJoslyn Devonam acting as scribe for YaTruitt MerleMD.   I have reviewed the above documentation for accuracy and completeness, and I agree with the above.

## 2019-02-22 ENCOUNTER — Ambulatory Visit (HOSPITAL_COMMUNITY)
Admission: RE | Admit: 2019-02-22 | Discharge: 2019-02-22 | Disposition: A | Discharge status: Home / Self Care | Payer: Medicare Other | Source: Ambulatory Visit | Attending: Nurse Practitioner | Admitting: Nurse Practitioner

## 2019-02-22 ENCOUNTER — Other Ambulatory Visit: Payer: Self-pay

## 2019-02-22 ENCOUNTER — Other Ambulatory Visit: Payer: Self-pay | Admitting: Hematology

## 2019-02-22 DIAGNOSIS — C221 Intrahepatic bile duct carcinoma: Secondary | ICD-10-CM | POA: Insufficient documentation

## 2019-02-22 NOTE — Progress Notes (Signed)
DISCONTINUE OFF PATHWAY REGIMEN - [Other Dx]   [id number]:Gemcitabine + Cisplatin every 28 days:   A cycle is every 28 days:     Gemcitabine      Cisplatin   **Always confirm dose/schedule in your pharmacy ordering system**  REASON: Disease Progression PRIOR TREATMENT: Gemcitabine + Cisplatin every 28 days TREATMENT RESPONSE: Partial Response (PR)  START OFF PATHWAY REGIMEN - Other   OFF01020:FOLFOX (q14d) **2 cycles per order sheet**:   A cycle is every 14 days:     Oxaliplatin      Leucovorin      Fluorouracil      Fluorouracil   **Always confirm dose/schedule in your pharmacy ordering system**  Patient Characteristics: Intent of Therapy: Non-Curative / Palliative Intent, Discussed with Patient

## 2019-02-24 ENCOUNTER — Inpatient Hospital Stay: Payer: Medicare Other | Attending: Hematology

## 2019-02-24 ENCOUNTER — Inpatient Hospital Stay (HOSPITAL_BASED_OUTPATIENT_CLINIC_OR_DEPARTMENT_OTHER): Payer: Medicare Other | Admitting: Hematology

## 2019-02-24 ENCOUNTER — Telehealth: Payer: Self-pay

## 2019-02-24 ENCOUNTER — Inpatient Hospital Stay: Payer: Medicare Other

## 2019-02-24 ENCOUNTER — Encounter: Payer: Self-pay | Admitting: Hematology

## 2019-02-24 ENCOUNTER — Other Ambulatory Visit: Payer: Self-pay

## 2019-02-24 VITALS — BP 168/90 | HR 74 | Temp 98.7°F | Resp 17 | Ht 66.0 in | Wt 147.5 lb

## 2019-02-24 DIAGNOSIS — D6481 Anemia due to antineoplastic chemotherapy: Secondary | ICD-10-CM | POA: Diagnosis not present

## 2019-02-24 DIAGNOSIS — C221 Intrahepatic bile duct carcinoma: Secondary | ICD-10-CM

## 2019-02-24 DIAGNOSIS — Z5111 Encounter for antineoplastic chemotherapy: Secondary | ICD-10-CM | POA: Insufficient documentation

## 2019-02-24 DIAGNOSIS — Z992 Dependence on renal dialysis: Secondary | ICD-10-CM

## 2019-02-24 DIAGNOSIS — Z452 Encounter for adjustment and management of vascular access device: Secondary | ICD-10-CM | POA: Diagnosis not present

## 2019-02-24 DIAGNOSIS — I120 Hypertensive chronic kidney disease with stage 5 chronic kidney disease or end stage renal disease: Secondary | ICD-10-CM | POA: Insufficient documentation

## 2019-02-24 DIAGNOSIS — Z95828 Presence of other vascular implants and grafts: Secondary | ICD-10-CM

## 2019-02-24 DIAGNOSIS — G620 Drug-induced polyneuropathy: Secondary | ICD-10-CM | POA: Diagnosis not present

## 2019-02-24 DIAGNOSIS — I10 Essential (primary) hypertension: Secondary | ICD-10-CM

## 2019-02-24 DIAGNOSIS — N186 End stage renal disease: Secondary | ICD-10-CM | POA: Insufficient documentation

## 2019-02-24 LAB — CBC WITH DIFFERENTIAL (CANCER CENTER ONLY)
Abs Immature Granulocytes: 0.04 10*3/uL (ref 0.00–0.07)
Basophils Absolute: 0 10*3/uL (ref 0.0–0.1)
Basophils Relative: 0 %
Eosinophils Absolute: 0.4 10*3/uL (ref 0.0–0.5)
Eosinophils Relative: 6 %
HCT: 26.5 % — ABNORMAL LOW (ref 36.0–46.0)
Hemoglobin: 8.5 g/dL — ABNORMAL LOW (ref 12.0–15.0)
Immature Granulocytes: 1 %
Lymphocytes Relative: 16 %
Lymphs Abs: 1.1 10*3/uL (ref 0.7–4.0)
MCH: 32.7 pg (ref 26.0–34.0)
MCHC: 32.1 g/dL (ref 30.0–36.0)
MCV: 101.9 fL — ABNORMAL HIGH (ref 80.0–100.0)
Monocytes Absolute: 0.9 10*3/uL (ref 0.1–1.0)
Monocytes Relative: 13 %
Neutro Abs: 4.2 10*3/uL (ref 1.7–7.7)
Neutrophils Relative %: 64 %
Platelet Count: 240 10*3/uL (ref 150–400)
RBC: 2.6 MIL/uL — ABNORMAL LOW (ref 3.87–5.11)
RDW: 17.4 % — ABNORMAL HIGH (ref 11.5–15.5)
WBC Count: 6.6 10*3/uL (ref 4.0–10.5)
nRBC: 0 % (ref 0.0–0.2)

## 2019-02-24 LAB — CMP (CANCER CENTER ONLY)
ALT: 8 U/L (ref 0–44)
AST: 14 U/L — ABNORMAL LOW (ref 15–41)
Albumin: 2.9 g/dL — ABNORMAL LOW (ref 3.5–5.0)
Alkaline Phosphatase: 138 U/L — ABNORMAL HIGH (ref 38–126)
Anion gap: 9 (ref 5–15)
BUN: 28 mg/dL — ABNORMAL HIGH (ref 8–23)
CO2: 29 mmol/L (ref 22–32)
Calcium: 7.8 mg/dL — ABNORMAL LOW (ref 8.9–10.3)
Chloride: 100 mmol/L (ref 98–111)
Creatinine: 5.45 mg/dL (ref 0.44–1.00)
GFR, Est AFR Am: 8 mL/min — ABNORMAL LOW (ref >60)
GFR, Estimated: 7 mL/min — ABNORMAL LOW (ref >60)
Glucose, Bld: 94 mg/dL (ref 70–99)
Potassium: 3.8 mmol/L (ref 3.5–5.1)
Sodium: 138 mmol/L (ref 135–145)
Total Bilirubin: 0.4 mg/dL (ref 0.3–1.2)
Total Protein: 6.6 g/dL (ref 6.5–8.1)

## 2019-02-24 MED ORDER — HEPARIN SOD (PORK) LOCK FLUSH 100 UNIT/ML IV SOLN
500.0000 [IU] | Freq: Once | INTRAVENOUS | Status: DC | PRN
  Filled 2019-02-24: qty 5

## 2019-02-24 MED ORDER — SODIUM CHLORIDE 0.9 % IV SOLN: 10.0000 mg | Freq: Once | INTRAVENOUS | Status: DC

## 2019-02-24 MED ORDER — PALONOSETRON HCL INJECTION 0.25 MG/5ML
0.2500 mg | Freq: Once | INTRAVENOUS | Status: AC
  Administered 2019-02-24: 11:00:00 0.25 mg via INTRAVENOUS

## 2019-02-24 MED ORDER — DEXTROSE 5 % IV SOLN
Freq: Once | INTRAVENOUS | Status: AC
  Administered 2019-02-24: 11:00:00 via INTRAVENOUS
  Filled 2019-02-24: qty 250

## 2019-02-24 MED ORDER — DEXAMETHASONE SODIUM PHOSPHATE 10 MG/ML IJ SOLN
INTRAMUSCULAR | Status: AC
  Filled 2019-02-24: qty 1

## 2019-02-24 MED ORDER — OXALIPLATIN CHEMO INJECTION 100 MG/20ML
50.0000 mg/m2 | Freq: Once | INTRAVENOUS | Status: AC
  Administered 2019-02-24: 12:00:00 85 mg via INTRAVENOUS
  Filled 2019-02-24: qty 17

## 2019-02-24 MED ORDER — ALTEPLASE 2 MG IJ SOLR
2.0000 mg | Freq: Once | INTRAMUSCULAR | Status: DC | PRN
  Filled 2019-02-24: qty 2

## 2019-02-24 MED ORDER — SODIUM CHLORIDE 0.9% FLUSH
10.0000 mL | INTRAVENOUS | Status: DC | PRN
  Administered 2019-02-24: 10 mL
  Filled 2019-02-24: qty 10

## 2019-02-24 MED ORDER — LEUCOVORIN CALCIUM INJECTION 350 MG
400.0000 mg/m2 | Freq: Once | INTRAVENOUS | Status: AC
  Administered 2019-02-24: 696 mg via INTRAVENOUS
  Filled 2019-02-24: qty 34.8

## 2019-02-24 MED ORDER — SODIUM CHLORIDE 0.9 % IV SOLN
2000.0000 mg/m2 | INTRAVENOUS | Status: DC
  Administered 2019-02-24: 14:00:00 3500 mg via INTRAVENOUS
  Filled 2019-02-24: qty 70

## 2019-02-24 MED ORDER — DEXAMETHASONE SODIUM PHOSPHATE 10 MG/ML IJ SOLN
10.0000 mg | Freq: Once | INTRAMUSCULAR | Status: AC
  Administered 2019-02-24: 10 mg via INTRAVENOUS

## 2019-02-24 MED ORDER — DEXTROSE 5 % IV SOLN
Freq: Once | INTRAVENOUS | Status: AC
  Administered 2019-02-24: 12:00:00 via INTRAVENOUS
  Filled 2019-02-24: qty 250

## 2019-02-24 MED ORDER — PALONOSETRON HCL INJECTION 0.25 MG/5ML
INTRAVENOUS | Status: AC
  Filled 2019-02-24: qty 5

## 2019-02-24 NOTE — Patient Instructions (Signed)

## 2019-02-24 NOTE — Telephone Encounter (Addendum)
CRITICAL VALUE STICKER  CRITICAL VALUE: creatinine 5  RECEIVER (on-site recipient of call):Karen Orosi NOTIFIED: 02/24/19 10:05  MESSENGER (representative from lab): [facility]   MD NOTIFIED: Dr. Burr Medico  TIME OF NOTIFICATION: 10:09  RESPONSE: Pt. Dialysis Pt baseline creatinine

## 2019-02-24 NOTE — Progress Notes (Signed)
Per Dr. Burr Medico: OK to treat with CMP results from today. Also, OK to increase 5-FU pump rate to ensure patient's pump will D/C by 10am on Saturday 02/26/19.   Called [facility] (where patient receives dialysis) and spoke with a scheduler to see if patient's Friday appointment could be moved to Saturday afternoon after her 5-FU pump was disconnected. Was told they could move her to 11:30 on Saturday.   Infusion RN updated and asked to communicate above information.

## 2019-02-24 NOTE — Patient Instructions (Addendum)
[facility] Discharge Instructions for Patients Receiving Chemotherapy  Today you received the following chemotherapy agents Oxaliplatin, Leucovorin and Adrucil   To help prevent nausea and vomiting after your treatment, we encourage you to take your nausea medication as directed.    If you develop nausea and vomiting that is not controlled by your nausea medication, call the clinic.   BELOW ARE SYMPTOMS THAT SHOULD BE REPORTED IMMEDIATELY:  *FEVER GREATER THAN 100.5 F  *CHILLS WITH OR WITHOUT FEVER  NAUSEA AND VOMITING THAT IS NOT CONTROLLED WITH YOUR NAUSEA MEDICATION  *UNUSUAL SHORTNESS OF BREATH  *UNUSUAL BRUISING OR BLEEDING  TENDERNESS IN MOUTH AND THROAT WITH OR WITHOUT PRESENCE OF ULCERS  *URINARY PROBLEMS  *BOWEL PROBLEMS  UNUSUAL RASH Items with * indicate a potential emergency and should be followed up as soon as possible.  Feel free to call the clinic should you have any questions or concerns. The clinic phone number is [phone].  Please show the [facility] at check-in to the Emergency Department and triage nurse.  Oxaliplatin Injection What is this medicine? OXALIPLATIN (ox AL i PLA tin) is a chemotherapy drug. It targets fast dividing cells, like cancer cells, and causes these cells to die. This medicine is used to treat cancers of the colon and rectum, and many other cancers. This medicine may be used for other purposes; ask your health care provider or pharmacist if you have questions. COMMON BRAND NAME(S): Eloxatin What should I tell my health care provider before I take this medicine? They need to know if you have any of these conditions:  kidney disease  an unusual or allergic reaction to oxaliplatin, other chemotherapy, other medicines, foods, dyes, or preservatives  pregnant or trying to get pregnant  breast-feeding How should I use this medicine? This drug is given as an infusion into a vein. It is administered in a  hospital or clinic by a specially trained health care professional. Talk to your pediatrician regarding the use of this medicine in children. Special care may be needed. Overdosage: If you think you have taken too much of this medicine contact a poison control center or emergency room at once. NOTE: This medicine is only for you. Do not share this medicine with others. What if I miss a dose? It is important not to miss a dose. Call your doctor or health care professional if you are unable to keep an appointment. What may interact with this medicine?  medicines to increase blood counts like filgrastim, pegfilgrastim, sargramostim  probenecid  some antibiotics like amikacin, gentamicin, neomycin, polymyxin B, streptomycin, tobramycin  zalcitabine Talk to your doctor or health care professional before taking any of these medicines:  acetaminophen  aspirin  ibuprofen  ketoprofen  naproxen This list may not describe all possible interactions. Give your health care provider a list of all the medicines, herbs, non-prescription drugs, or dietary supplements you use. Also tell them if you smoke, drink alcohol, or use illegal drugs. Some items may interact with your medicine. What should I watch for while using this medicine? Your condition will be monitored carefully while you are receiving this medicine. You will need important blood work done while you are taking this medicine. This medicine can make you more sensitive to cold. Do not drink cold drinks or use ice. Cover exposed skin before coming in contact with cold temperatures or cold objects. When out in cold weather wear warm clothing and cover your mouth and nose to warm the air  that goes into your lungs. Tell your doctor if you get sensitive to the cold. This drug may make you feel generally unwell. This is not uncommon, as chemotherapy can affect healthy cells as well as cancer cells. Report any side effects. Continue your course of  treatment even though you feel ill unless your doctor tells you to stop. In some cases, you may be given additional medicines to help with side effects. Follow all directions for their use. Call your doctor or health care professional for advice if you get a fever, chills or sore throat, or other symptoms of a cold or flu. Do not treat yourself. This drug decreases your body's ability to fight infections. Try to avoid being around people who are sick. This medicine may increase your risk to bruise or bleed. Call your doctor or health care professional if you notice any unusual bleeding. Be careful brushing and flossing your teeth or using a toothpick because you may get an infection or bleed more easily. If you have any dental work done, tell your dentist you are receiving this medicine. Avoid taking products that contain aspirin, acetaminophen, ibuprofen, naproxen, or ketoprofen unless instructed by your doctor. These medicines may hide a fever. Do not become pregnant while taking this medicine. Women should inform their doctor if they wish to become pregnant or think they might be pregnant. There is a potential for serious side effects to an unborn child. Talk to your health care professional or pharmacist for more information. Do not breast-feed an infant while taking this medicine. Call your doctor or health care professional if you get diarrhea. Do not treat yourself. What side effects may I notice from receiving this medicine? Side effects that you should report to your doctor or health care professional as soon as possible:  allergic reactions like skin rash, itching or hives, swelling of the face, lips, or tongue  low blood counts - This drug may decrease the number of white blood cells, red blood cells and platelets. You may be at increased risk for infections and bleeding.  signs of infection - fever or chills, cough, sore throat, pain or difficulty passing urine  signs of decreased  platelets or bleeding - bruising, pinpoint red spots on the skin, black, tarry stools, nosebleeds  signs of decreased red blood cells - unusually weak or tired, fainting spells, lightheadedness  breathing problems  chest pain, pressure  cough  diarrhea  jaw tightness  mouth sores  nausea and vomiting  pain, swelling, redness or irritation at the injection site  pain, tingling, numbness in the hands or feet  problems with balance, talking, walking  redness, blistering, peeling or loosening of the skin, including inside the mouth  trouble passing urine or change in the amount of urine Side effects that usually do not require medical attention (report to your doctor or health care professional if they continue or are bothersome):  changes in vision  constipation  hair loss  loss of appetite  metallic taste in the mouth or changes in taste  stomach pain This list may not describe all possible side effects. Call your doctor for medical advice about side effects. You may report side effects to FDA at 1-800-FDA-1088. Where should I keep my medicine? This drug is given in a hospital or clinic and will not be stored at home. NOTE: This sheet is a summary. It may not cover all possible information. If you have questions about this medicine, talk to your doctor, pharmacist, or health  care provider.  2020 Elsevier/Gold Standard (2007-11-30 17:22:47)  Leucovorin injection What is this medicine? LEUCOVORIN (loo koe VOR in) is used to prevent or treat the harmful effects of some medicines. This medicine is used to treat anemia caused by a low amount of folic acid in the body. It is also used with 5-fluorouracil (5-FU) to treat colon cancer. This medicine may be used for other purposes; ask your health care provider or pharmacist if you have questions. What should I tell my health care provider before I take this medicine? They need to know if you have any of these  conditions:  anemia from low levels of vitamin B-12 in the blood  an unusual or allergic reaction to leucovorin, folic acid, other medicines, foods, dyes, or preservatives  pregnant or trying to get pregnant  breast-feeding How should I use this medicine? This medicine is for injection into a muscle or into a vein. It is given by a health care professional in a hospital or clinic setting. Talk to your pediatrician regarding the use of this medicine in children. Special care may be needed. Overdosage: If you think you have taken too much of this medicine contact a poison control center or emergency room at once. NOTE: This medicine is only for you. Do not share this medicine with others. What if I miss a dose? This does not apply. What may interact with this medicine?  capecitabine  fluorouracil  phenobarbital  phenytoin  primidone  trimethoprim-sulfamethoxazole This list may not describe all possible interactions. Give your health care provider a list of all the medicines, herbs, non-prescription drugs, or dietary supplements you use. Also tell them if you smoke, drink alcohol, or use illegal drugs. Some items may interact with your medicine. What should I watch for while using this medicine? Your condition will be monitored carefully while you are receiving this medicine. This medicine may increase the side effects of 5-fluorouracil, 5-FU. Tell your doctor or health care professional if you have diarrhea or mouth sores that do not get better or that get worse. What side effects may I notice from receiving this medicine? Side effects that you should report to your doctor or health care professional as soon as possible:  allergic reactions like skin rash, itching or hives, swelling of the face, lips, or tongue  breathing problems  fever, infection  mouth sores  unusual bleeding or bruising  unusually weak or tired Side effects that usually do not require medical  attention (report to your doctor or health care professional if they continue or are bothersome):  constipation or diarrhea  loss of appetite  nausea, vomiting This list may not describe all possible side effects. Call your doctor for medical advice about side effects. You may report side effects to FDA at 1-800-FDA-1088. Where should I keep my medicine? This drug is given in a hospital or clinic and will not be stored at home. NOTE: This sheet is a summary. It may not cover all possible information. If you have questions about this medicine, talk to your doctor, pharmacist, or health care provider.  2020 Elsevier/Gold Standard (2007-11-09 16:50:29)  Fluorouracil, 5-FU injection What is this medicine? FLUOROURACIL, 5-FU (flure oh YOOR a sil) is a chemotherapy drug. It slows the growth of cancer cells. This medicine is used to treat many types of cancer like breast cancer, colon or rectal cancer, pancreatic cancer, and stomach cancer. This medicine may be used for other purposes; ask your health care provider or pharmacist if you  have questions. COMMON BRAND NAME(S): Adrucil What should I tell my health care provider before I take this medicine? They need to know if you have any of these conditions:  blood disorders  dihydropyrimidine dehydrogenase (DPD) deficiency  infection (especially a virus infection such as chickenpox, cold sores, or herpes)  kidney disease  liver disease  malnourished, poor nutrition  recent or ongoing radiation therapy  an unusual or allergic reaction to fluorouracil, other chemotherapy, other medicines, foods, dyes, or preservatives  pregnant or trying to get pregnant  breast-feeding How should I use this medicine? This drug is given as an infusion or injection into a vein. It is administered in a hospital or clinic by a specially trained health care professional. Talk to your pediatrician regarding the use of this medicine in children. Special care  may be needed. Overdosage: If you think you have taken too much of this medicine contact a poison control center or emergency room at once. NOTE: This medicine is only for you. Do not share this medicine with others. What if I miss a dose? It is important not to miss your dose. Call your doctor or health care professional if you are unable to keep an appointment. What may interact with this medicine?  allopurinol  cimetidine  dapsone  digoxin  hydroxyurea  leucovorin  levamisole  medicines for seizures like ethotoin, fosphenytoin, phenytoin  medicines to increase blood counts like filgrastim, pegfilgrastim, sargramostim  medicines that treat or prevent blood clots like warfarin, enoxaparin, and dalteparin  methotrexate  metronidazole  pyrimethamine  some other chemotherapy drugs like busulfan, cisplatin, estramustine, vinblastine  trimethoprim  trimetrexate  vaccines Talk to your doctor or health care professional before taking any of these medicines:  acetaminophen  aspirin  ibuprofen  ketoprofen  naproxen This list may not describe all possible interactions. Give your health care provider a list of all the medicines, herbs, non-prescription drugs, or dietary supplements you use. Also tell them if you smoke, drink alcohol, or use illegal drugs. Some items may interact with your medicine. What should I watch for while using this medicine? Visit your doctor for checks on your progress. This drug may make you feel generally unwell. This is not uncommon, as chemotherapy can affect healthy cells as well as cancer cells. Report any side effects. Continue your course of treatment even though you feel ill unless your doctor tells you to stop. In some cases, you may be given additional medicines to help with side effects. Follow all directions for their use. Call your doctor or health care professional for advice if you get a fever, chills or sore throat, or other  symptoms of a cold or flu. Do not treat yourself. This drug decreases your body's ability to fight infections. Try to avoid being around people who are sick. This medicine may increase your risk to bruise or bleed. Call your doctor or health care professional if you notice any unusual bleeding. Be careful brushing and flossing your teeth or using a toothpick because you may get an infection or bleed more easily. If you have any dental work done, tell your dentist you are receiving this medicine. Avoid taking products that contain aspirin, acetaminophen, ibuprofen, naproxen, or ketoprofen unless instructed by your doctor. These medicines may hide a fever. Do not become pregnant while taking this medicine. Women should inform their doctor if they wish to become pregnant or think they might be pregnant. There is a potential for serious side effects to an unborn child. Talk  to your health care professional or pharmacist for more information. Do not breast-feed an infant while taking this medicine. Men should inform their doctor if they wish to father a child. This medicine may lower sperm counts. Do not treat diarrhea with over the counter products. Contact your doctor if you have diarrhea that lasts more than 2 days or if it is severe and watery. This medicine can make you more sensitive to the sun. Keep out of the sun. If you cannot avoid being in the sun, wear protective clothing and use sunscreen. Do not use sun lamps or tanning beds/booths. What side effects may I notice from receiving this medicine? Side effects that you should report to your doctor or health care professional as soon as possible:  allergic reactions like skin rash, itching or hives, swelling of the face, lips, or tongue  low blood counts - this medicine may decrease the number of white blood cells, red blood cells and platelets. You may be at increased risk for infections and bleeding.  signs of infection - fever or chills, cough,  sore throat, pain or difficulty passing urine  signs of decreased platelets or bleeding - bruising, pinpoint red spots on the skin, black, tarry stools, blood in the urine  signs of decreased red blood cells - unusually weak or tired, fainting spells, lightheadedness  breathing problems  changes in vision  chest pain  mouth sores  nausea and vomiting  pain, swelling, redness at site where injected  pain, tingling, numbness in the hands or feet  redness, swelling, or sores on hands or feet  stomach pain  unusual bleeding Side effects that usually do not require medical attention (report to your doctor or health care professional if they continue or are bothersome):  changes in finger or toe nails  diarrhea  dry or itchy skin  hair loss  headache  loss of appetite  sensitivity of eyes to the light  stomach upset  unusually teary eyes This list may not describe all possible side effects. Call your doctor for medical advice about side effects. You may report side effects to FDA at 1-800-FDA-1088. Where should I keep my medicine? This drug is given in a hospital or clinic and will not be stored at home. NOTE: This sheet is a summary. It may not cover all possible information. If you have questions about this medicine, talk to your doctor, pharmacist, or health care provider.  2020 Elsevier/Gold Standard (2007-09-08 13:53:16)

## 2019-02-25 ENCOUNTER — Telehealth: Payer: Self-pay | Admitting: Hematology

## 2019-02-25 NOTE — Telephone Encounter (Signed)
Scheduled appt per 10/8 los.

## 2019-02-26 ENCOUNTER — Inpatient Hospital Stay: Payer: Medicare Other

## 2019-02-26 ENCOUNTER — Other Ambulatory Visit: Payer: Self-pay

## 2019-02-26 VITALS — BP 184/92 | HR 76 | Temp 98.7°F | Resp 20

## 2019-02-26 DIAGNOSIS — Z95828 Presence of other vascular implants and grafts: Secondary | ICD-10-CM

## 2019-02-26 DIAGNOSIS — Z5111 Encounter for antineoplastic chemotherapy: Secondary | ICD-10-CM | POA: Diagnosis not present

## 2019-02-26 MED ORDER — HEPARIN SOD (PORK) LOCK FLUSH 100 UNIT/ML IV SOLN
500.0000 [IU] | Freq: Once | INTRAVENOUS | Status: AC | PRN
  Administered 2019-02-26: 500 [IU]
  Filled 2019-02-26: qty 5

## 2019-02-26 MED ORDER — SODIUM CHLORIDE 0.9% FLUSH
10.0000 mL | INTRAVENOUS | Status: DC | PRN
  Administered 2019-02-26: 10 mL
  Filled 2019-02-26: qty 10

## 2019-03-06 ENCOUNTER — Other Ambulatory Visit: Payer: Self-pay | Admitting: Hematology

## 2019-03-06 DIAGNOSIS — C221 Intrahepatic bile duct carcinoma: Secondary | ICD-10-CM

## 2019-03-08 ENCOUNTER — Ambulatory Visit: Payer: Medicare Other

## 2019-03-08 ENCOUNTER — Other Ambulatory Visit: Payer: Medicare Other

## 2019-03-08 ENCOUNTER — Telehealth: Payer: Self-pay | Admitting: Hematology

## 2019-03-08 ENCOUNTER — Ambulatory Visit: Payer: Medicare Other | Admitting: Nurse Practitioner

## 2019-03-08 NOTE — Telephone Encounter (Signed)
Tried to contact patient for the second time. No answer. No voicemail.

## 2019-03-09 ENCOUNTER — Ambulatory Visit: Payer: Medicare Other

## 2019-03-09 ENCOUNTER — Other Ambulatory Visit: Payer: Medicare Other

## 2019-03-09 ENCOUNTER — Ambulatory Visit: Payer: Medicare Other | Admitting: Nurse Practitioner

## 2019-03-10 ENCOUNTER — Other Ambulatory Visit: Payer: Self-pay

## 2019-03-10 ENCOUNTER — Inpatient Hospital Stay: Payer: Medicare Other | Admitting: Medical

## 2019-03-10 ENCOUNTER — Inpatient Hospital Stay: Payer: Medicare Other

## 2019-03-10 ENCOUNTER — Other Ambulatory Visit: Payer: Self-pay | Admitting: Hematology

## 2019-03-10 VITALS — BP 170/90 | HR 81 | Temp 98.9°F | Resp 18 | Ht 66.0 in | Wt 145.0 lb

## 2019-03-10 DIAGNOSIS — N186 End stage renal disease: Secondary | ICD-10-CM | POA: Diagnosis not present

## 2019-03-10 DIAGNOSIS — Z992 Dependence on renal dialysis: Secondary | ICD-10-CM

## 2019-03-10 DIAGNOSIS — C221 Intrahepatic bile duct carcinoma: Secondary | ICD-10-CM

## 2019-03-10 DIAGNOSIS — Z5111 Encounter for antineoplastic chemotherapy: Secondary | ICD-10-CM | POA: Diagnosis not present

## 2019-03-10 DIAGNOSIS — Z95828 Presence of other vascular implants and grafts: Secondary | ICD-10-CM

## 2019-03-10 LAB — CBC WITH DIFFERENTIAL (CANCER CENTER ONLY)
Abs Immature Granulocytes: 0.03 10*3/uL (ref 0.00–0.07)
Basophils Absolute: 0 10*3/uL (ref 0.0–0.1)
Basophils Relative: 0 %
Eosinophils Absolute: 0.6 10*3/uL — ABNORMAL HIGH (ref 0.0–0.5)
Eosinophils Relative: 9 %
HCT: 30.3 % — ABNORMAL LOW (ref 36.0–46.0)
Hemoglobin: 9.5 g/dL — ABNORMAL LOW (ref 12.0–15.0)
Immature Granulocytes: 0 %
Lymphocytes Relative: 15 %
Lymphs Abs: 1.1 10*3/uL (ref 0.7–4.0)
MCH: 32 pg (ref 26.0–34.0)
MCHC: 31.4 g/dL (ref 30.0–36.0)
MCV: 102 fL — ABNORMAL HIGH (ref 80.0–100.0)
Monocytes Absolute: 1.1 10*3/uL — ABNORMAL HIGH (ref 0.1–1.0)
Monocytes Relative: 15 %
Neutro Abs: 4.2 10*3/uL (ref 1.7–7.7)
Neutrophils Relative %: 61 %
Platelet Count: 236 10*3/uL (ref 150–400)
RBC: 2.97 MIL/uL — ABNORMAL LOW (ref 3.87–5.11)
RDW: 18 % — ABNORMAL HIGH (ref 11.5–15.5)
WBC Count: 7 10*3/uL (ref 4.0–10.5)
nRBC: 0 % (ref 0.0–0.2)

## 2019-03-10 LAB — CMP (CANCER CENTER ONLY)
ALT: <6 U/L (ref 0–44)
AST: 14 U/L — ABNORMAL LOW (ref 15–41)
Albumin: 3 g/dL — ABNORMAL LOW (ref 3.5–5.0)
Alkaline Phosphatase: 134 U/L — ABNORMAL HIGH (ref 38–126)
Anion gap: 12 (ref 5–15)
BUN: 21 mg/dL (ref 8–23)
CO2: 27 mmol/L (ref 22–32)
Calcium: 7.8 mg/dL — ABNORMAL LOW (ref 8.9–10.3)
Chloride: 103 mmol/L (ref 98–111)
Creatinine: 5.47 mg/dL (ref 0.44–1.00)
GFR, Est AFR Am: 8 mL/min — ABNORMAL LOW (ref >60)
GFR, Estimated: 7 mL/min — ABNORMAL LOW (ref >60)
Glucose, Bld: 89 mg/dL (ref 70–99)
Potassium: 3.6 mmol/L (ref 3.5–5.1)
Sodium: 142 mmol/L (ref 135–145)
Total Bilirubin: 0.5 mg/dL (ref 0.3–1.2)
Total Protein: 6.9 g/dL (ref 6.5–8.1)

## 2019-03-10 MED ORDER — SODIUM CHLORIDE 0.9% FLUSH
10.0000 mL | INTRAVENOUS | Status: DC | PRN
  Administered 2019-03-10: 10 mL
  Filled 2019-03-10: qty 10

## 2019-03-10 MED ORDER — DEXTROSE 5 % IV SOLN
Freq: Once | INTRAVENOUS | Status: AC
  Administered 2019-03-10: 14:00:00 via INTRAVENOUS
  Filled 2019-03-10: qty 250

## 2019-03-10 MED ORDER — PALONOSETRON HCL INJECTION 0.25 MG/5ML
INTRAVENOUS | Status: AC
  Filled 2019-03-10: qty 5

## 2019-03-10 MED ORDER — LEUCOVORIN CALCIUM INJECTION 350 MG
400.0000 mg/m2 | Freq: Once | INTRAVENOUS | Status: AC
  Administered 2019-03-10: 696 mg via INTRAVENOUS
  Filled 2019-03-10: qty 34.8

## 2019-03-10 MED ORDER — DEXAMETHASONE SODIUM PHOSPHATE 10 MG/ML IJ SOLN
INTRAMUSCULAR | Status: AC
  Filled 2019-03-10: qty 1

## 2019-03-10 MED ORDER — DEXAMETHASONE SODIUM PHOSPHATE 10 MG/ML IJ SOLN
10.0000 mg | Freq: Once | INTRAMUSCULAR | Status: AC
  Administered 2019-03-10: 10 mg via INTRAVENOUS

## 2019-03-10 MED ORDER — OXALIPLATIN CHEMO INJECTION 100 MG/20ML
50.0000 mg/m2 | Freq: Once | INTRAVENOUS | Status: AC
  Administered 2019-03-10: 85 mg via INTRAVENOUS
  Filled 2019-03-10: qty 17

## 2019-03-10 MED ORDER — PALONOSETRON HCL INJECTION 0.25 MG/5ML
0.2500 mg | Freq: Once | INTRAVENOUS | Status: AC
  Administered 2019-03-10: 0.25 mg via INTRAVENOUS

## 2019-03-10 MED ORDER — SODIUM CHLORIDE 0.9 % IV SOLN
2000.0000 mg/m2 | INTRAVENOUS | Status: DC
  Administered 2019-03-10: 3500 mg via INTRAVENOUS
  Filled 2019-03-10: qty 70

## 2019-03-10 NOTE — Patient Instructions (Signed)
COVID-19: How to Protect Yourself and Others Know how it spreads  There is currently no vaccine to prevent coronavirus disease 2019 (COVID-19).  The best way to prevent illness is to avoid being exposed to this virus.  The virus is thought to spread mainly from person-to-person. ? Between people who are in close contact with one another (within about 6 feet). ? Through respiratory droplets produced when an infected person coughs, sneezes or talks. ? These droplets can land in the mouths or noses of people who are nearby or possibly be inhaled into the lungs. ? Some recent studies have suggested that COVID-19 may be spread by people who are not showing symptoms. Everyone should Clean your hands often  Wash your hands often with soap and water for at least 20 seconds especially after you have been in a public place, or after blowing your nose, coughing, or sneezing.  If soap and water are not readily available, use a hand sanitizer that contains at least 60% alcohol. Cover all surfaces of your hands and rub them together until they feel dry.  Avoid touching your eyes, nose, and mouth with unwashed hands. Avoid close contact  Stay home if you are sick.  Avoid close contact with people who are sick.  Put distance between yourself and other people. ? Remember that some people without symptoms may be able to spread virus. ? This is especially important for people who are at higher risk of getting very sick.www.cdc.gov/coronavirus/2019-ncov/need-extra-precautions/people-at-higher-risk.html Cover your mouth and nose with a cloth face cover when around others  You could spread COVID-19 to others even if you do not feel sick.  Everyone should wear a cloth face cover when they have to go out in public, for example to the grocery store or to pick up other necessities. ? Cloth face coverings should not be placed on young children under age 2, anyone who has trouble breathing, or is unconscious,  incapacitated or otherwise unable to remove the mask without assistance.  The cloth face cover is meant to protect other people in case you are infected.  Do NOT use a facemask meant for a healthcare worker.  Continue to keep about 6 feet between yourself and others. The cloth face cover is not a substitute for social distancing. Cover coughs and sneezes  If you are in a private setting and do not have on your cloth face covering, remember to always cover your mouth and nose with a tissue when you cough or sneeze or use the inside of your elbow.  Throw used tissues in the trash.  Immediately wash your hands with soap and water for at least 20 seconds. If soap and water are not readily available, clean your hands with a hand sanitizer that contains at least 60% alcohol. Clean and disinfect  Clean AND disinfect frequently touched surfaces daily. This includes tables, doorknobs, light switches, countertops, handles, desks, phones, keyboards, toilets, faucets, and sinks. www.cdc.gov/coronavirus/2019-ncov/prevent-getting-sick/disinfecting-your-home.html  If surfaces are dirty, clean them: Use detergent or soap and water prior to disinfection.  Then, use a household disinfectant. You can see a list of EPA-registered household disinfectants here. cdc.gov/coronavirus 09/21/2018 This information is not intended to replace advice given to you by your health care provider. Make sure you discuss any questions you have with your health care provider. Document Released: 08/31/2018 Document Revised: 09/29/2018 Document Reviewed: 08/31/2018 Elsevier Patient Education  2020 [employer]  

## 2019-03-10 NOTE — Progress Notes (Signed)
[facility] Progress Note   Mary Chapman [id number] [birth year] 73 y.o.  Mary Chapman is managed by Dr. Truitt Merle  Actively treated with chemotherapy/immunotherapy/hormonal therapy: yes  Current therapy: FOLFOX  Last treated: 06/26/2018 (cycle 1)  Next scheduled appointment with provider: 03/24/2019  Assessment: Plan:    Intrahepatic cholangiocarcinoma ([place])  ESRD on dialysis ([place])   Adenocarcinomainliver, likelyintrahepatic cholangiocarcinoma, cT2N1M0 stage IIIB, unresectable, MS-Stable: Mary Chapman presents to the clinic today for cycle 2 of FOLFOX.  We will proceed with her treatment today with the patient to return to clinic in 2 weeks for consideration of cycle 3 of FOLFOX  End-stage renal disease: The patient continues on dialysis.  I have contacted the dialysis center and have asked that they change her Friday appointment to Saturday due to her treatment.  She was instructed to present to the dialysis center at 1145 for a 12:00 treatment.  The dialysis center suggest that we consider changing the patient's schedule for chemotherapy given the need to change her Friday dialysis appointments.  They report that they do not always have appointments available on Saturday.  It is suggested that we either change her chemotherapy schedule or call earlier for appointments.  Please see After Visit Summary for patient specific instructions.  Future Appointments  Date Time Provider [place]  03/12/2019 12:45 PM [facility] [place] FLUSH [facility] None  03/24/2019 10:00 AM [facility] ONLY [facility] None  03/24/2019 10:15 AM [facility] [place] FLUSH [facility] None  03/24/2019 10:40 AM Truitt Merle, MD [facility] None  03/24/2019 11:30 AM [facility] INFUSION [facility] None  03/26/2019 10:00 AM [facility] [place] FLUSH [facility] None  04/07/2019  9:30 AM [facility] ONLY [facility] None  04/07/2019  9:45 AM [facility] [place] FLUSH [facility] None  04/07/2019 10:20 AM Truitt Merle, MD  [facility] None  04/07/2019 11:00 AM [facility] INFUSION [facility] None  04/09/2019 10:00 AM [facility] [place] FLUSH [facility] None    No orders of the defined types were placed in this encounter.      Subjective:   Patient ID:  Mary Chapman is a 73 y.o. (DOB 1946/04/22) female.  Chief Complaint:  Chief Complaint  Patient presents with   Follow-up    HPI Mary Chapman  Is a 73 y.o. female with a diagnosis of an intra static cholangiocarcinoma.  She is managed by Dr. Truitt Merle and presents today for consideration of cycle 2 of FOLFOX.  She continues on dialysis.  She typically has dialysis on Monday Wednesdays and Fridays but needs her office to call so that her dialysis this week could be moved from Friday to Saturday.  Her only concern today is several dark spots on her face.  She overall is doing well she denies nausea, vomiting, diarrhea, fevers, chills, sweats, shortness of breath, chest pain, or anorexia.  She believes that she has tolerated her treatment well thus far.  Medications: I have reviewed the patient's current medications.  Allergies: No Known Allergies  Past Medical History:  Diagnosis Date   Diverticulitis    Focal segmental glomerulosclerosis    ESRD on MWF HD   Hypertension    intrahepatic cholangio ca dx'd10/2019   Renal insufficiency    dialysis pt MWF   Tobacco abuse     Past Surgical History:  Procedure Laterality Date   AV FISTULA PLACEMENT Left 03/18/2017   Procedure: Brachiocephalic ARTERIOVENOUS (AV) FISTULA CREATION left arm;  Surgeon: Elam Dutch, MD;  Location: [facility];  Service: Vascular;  Laterality: Left;   BIOPSY  03/05/2018  Procedure: BIOPSY;  Surgeon: Irving Copas., MD;  Location: [facility];  Service: Gastroenterology;;  enteroscopy bx /cytology brushing Greig Castilla bx   COLONOSCOPY WITH PROPOFOL N/A 03/05/2018   Procedure: COLONOSCOPY WITH PROPOFOL;  Surgeon: Irving Copas., MD;  Location: [place];   Service: Gastroenterology;  Laterality: N/A;  Bx, Spot   ENTEROSCOPY N/A 03/05/2018   Procedure: ENTEROSCOPY;  Surgeon: Mansouraty, Telford Nab., MD;  Location: [facility] ENDOSCOPY;  Service: Gastroenterology;  Laterality: N/A;  BX, Brushing, & Spot   INSERTION OF DIALYSIS CATHETER N/A 03/18/2017   Procedure: INSERTION OF DIALYSIS CATHETER;  Surgeon: Elam Dutch, MD;  Location: [place];  Service: Vascular;  Laterality: N/A;   IR GENERIC HISTORICAL  08/15/2016   IR US GUIDE VASC ACCESS RIGHT 08/15/2016 Arne Cleveland, MD [facility]   IR GENERIC HISTORICAL  08/15/2016   IR FLUORO GUIDE CV LINE RIGHT 08/15/2016 Arne Cleveland, MD [facility]   IR IMAGING GUIDED PORT INSERTION  05/06/2018   SUBMUCOSAL INJECTION  03/05/2018   Procedure: SUBMUCOSAL INJECTION;  Surgeon: Irving Copas., MD;  Location[facility] ENDOSCOPY;  Service: Gastroenterology;;  spot tatoo   TUBAL LIGATION      Family History  Problem Relation Age of Onset   Hypertension Mother    Diabetes Mother    Hypertension Father    Diabetes Father    Hypertension Sister    Hypertension Brother     Social History   Socioeconomic History   Marital status: Widowed    Spouse name: Not on file   Number of children: Not on file   Years of education: Not on file   Highest education level: Not on file  Occupational History   Occupation: retired  Scientist, product/process development strain: Not on file   Food insecurity    Worry: Not on file    Inability: Not on Lexicographer needs    Medical: Not on file    Non-medical: Not on file  Tobacco Use   Smoking status: Current Some Day Smoker    Packs/day: 0.25    Years: 20.00    Pack years: 5.00    Types: Cigarettes   Smokeless tobacco: Never Used   Tobacco comment: 3 cigs/day  Substance and Sexual Activity   Alcohol use: No    Comment: moderate drinker for 10 years, quit in 30 years   Drug use: No   Sexual activity: Not Currently    Birth  control/protection: Post-menopausal  Lifestyle   Physical activity    Days per week: Not on file    Minutes per session: Not on file   Stress: Not on file  Relationships   Social connections    Talks on phone: Not on file    Gets together: Not on file    Attends religious service: Not on file    Active member of club or organization: Not on file    Attends meetings of clubs or organizations: Not on file    Relationship status: Not on file   Intimate partner violence    Fear of current or ex partner: Not on file    Emotionally abused: Not on file    Physically abused: Not on file    Forced sexual activity: Not on file  Other Topics Concern   Not on file  Social History Narrative   Not on file    Past Medical History, Surgical history, Social history, and Family history were reviewed and updated as appropriate.  Please see review of systems for further details on the patient's review from today.   Review of Systems:  Review of Systems  Constitutional: Negative for chills, diaphoresis and fever.  HENT: Negative for trouble swallowing and voice change.   Respiratory: Negative for cough, chest tightness, shortness of breath and wheezing.   Cardiovascular: Negative for chest pain and palpitations.  Gastrointestinal: Negative for abdominal pain, constipation, diarrhea, nausea and vomiting.  Musculoskeletal: Negative for back pain and myalgias.  Skin: Positive for color change (Hyperpigmentation of the right cheek.).  Neurological: Negative for dizziness, light-headedness and headaches.    Objective:   Physical Exam:  BP (!) 170/90 (BP Location: Right Arm, Patient Position: Sitting)    Pulse 81    Temp 98.9 F (37.2 C) (Temporal)    Resp 18    Ht 5\' 6"  (1.676 m)    Wt 145 lb (65.8 kg)    SpO2 100%    BMI 23.40 kg/m  ECOG: 0  Physical Exam Constitutional:      General: She is not in acute distress.    Appearance: She is not diaphoretic.  HENT:     Head:  Normocephalic and atraumatic.  Eyes:     General: No scleral icterus.       Right eye: No discharge.        Left eye: No discharge.     Conjunctiva/sclera: Conjunctivae normal.  Cardiovascular:     Rate and Rhythm: Normal rate and regular rhythm.     Heart sounds: Normal heart sounds. No murmur. No friction rub. No gallop.   Pulmonary:     Effort: Pulmonary effort is normal. No respiratory distress.     Breath sounds: Normal breath sounds. No wheezing or rales.  Abdominal:     General: Abdomen is flat. Bowel sounds are normal.     Palpations: Abdomen is soft. There is mass (Hardened irregular mass in the right upper quadrant.).  Skin:    General: Skin is warm and dry.     Findings: No erythema or rash.  Neurological:     Mental Status: She is alert.     Motor: No weakness.  Psychiatric:        Mood and Affect: Mood normal.        Behavior: Behavior normal.        Thought Content: Thought content normal.        Judgment: Judgment normal.     Lab Review:     Component Value Date/Time   NA 142 03/10/2019 1047   K 3.6 03/10/2019 1047   CL 103 03/10/2019 1047   CO2 27 03/10/2019 1047   GLUCOSE 89 03/10/2019 1047   BUN 21 03/10/2019 1047   CREATININE 5.47 (HH) 03/10/2019 1047   CALCIUM 7.8 (L) 03/10/2019 1047   PROT 6.9 03/10/2019 1047   ALBUMIN 3.0 (L) 03/10/2019 1047   AST 14 (L) 03/10/2019 1047   ALT <6 03/10/2019 1047   ALKPHOS 134 (H) 03/10/2019 1047   BILITOT 0.5 03/10/2019 1047   GFRNONAA 7 (L) 03/10/2019 1047   GFRAA 8 (L) 03/10/2019 1047       Component Value Date/Time   WBC 7.0 03/10/2019 1047   WBC 8.8 01/14/2019 2251   RBC 2.97 (L) 03/10/2019 1047   HGB 9.5 (L) 03/10/2019 1047   HCT 30.3 (L) 03/10/2019 1047   PLT 236 03/10/2019 1047   MCV 102.0 (H) 03/10/2019 1047   MCH 32.0 03/10/2019 1047   MCHC 31.4 03/10/2019 1047  RDW 18.0 (H) 03/10/2019 1047   LYMPHSABS 1.1 03/10/2019 1047   MONOABS 1.1 (H) 03/10/2019 1047   EOSABS 0.6 (H) 03/10/2019 1047     BASOSABS 0.0 03/10/2019 1047   -------------------------------  Imaging from last 24 hours (if applicable):  Radiology interpretation: Ct Abdomen Pelvis Wo Contrast  Result Date: 02/22/2019 CLINICAL DATA:  Intrahepatic cholangiocarcinoma EXAM: CT CHEST, ABDOMEN AND PELVIS WITHOUT CONTRAST TECHNIQUE: Multidetector CT imaging of the chest, abdomen and pelvis was performed following the standard protocol without IV contrast. COMPARISON:  None. FINDINGS: CT CHEST FINDINGS Cardiovascular: The heart size is normal. No substantial pericardial effusion. Coronary artery calcification is evident. Atherosclerotic calcification is noted in the wall of the thoracic aorta. Right Port-A-Cath tip is positioned in the distal SVC. Mediastinum/Nodes: No mediastinal lymphadenopathy. 20 mm hypoattenuating right thyroid nodule evident. No evidence for gross hilar lymphadenopathy although assessment is limited by the lack of intravenous contrast on today's study. The esophagus has normal imaging features. There is no axillary lymphadenopathy. Lungs/Pleura: Centrilobular emphsyema noted. 4 mm right lower lobe subpleural nodule (108/4) is stable. No new suspicious pulmonary nodule or mass. No pleural effusion. Musculoskeletal: No worrisome lytic or sclerotic osseous abnormality. CT ABDOMEN PELVIS FINDINGS Hepatobiliary: The dominant lesion in the inferior liver measured previously at 6.0 x 4.9 cm now measures 7.4 x 5.3 cm (70/2). 3.0 cm lesion inferior tip right liver (79/2) was 0.8 cm previously. Assessment of liver parenchyma limited by lack of intravenous contrast on today's study. No intrahepatic or extrahepatic biliary dilation. Pancreas: No focal mass lesion. No dilatation of the main duct. No intraparenchymal cyst. No peripancreatic edema. Spleen: No splenomegaly. No focal mass lesion. Adrenals/Urinary Tract: No adrenal nodule or mass. Right kidney unremarkable. Adjacent nonobstructive stones in the upper pole right  kidney are similar. No evidence for hydroureter. Bladder decompressed. Stomach/Bowel: Stomach is unremarkable. No gastric wall thickening. No evidence of outlet obstruction. Duodenum is normally positioned as is the ligament of Treitz. No small bowel wall thickening. No small bowel dilatation. Diffuse colonic diverticulosis without diverticulitis. Vascular/Lymphatic: There is abdominal aortic atherosclerosis without aneurysm. Porta hepatis and hepato duodenal ligament not well evaluated due to lack of intravenous contrast material. No definite para-aortic lymphadenopathy. No pelvic sidewall lymphadenopathy. Reproductive: Limited assessment on noncontrast study. No adnexal mass. Other: Small volume intraperitoneal free fluid. Musculoskeletal: No worrisome lytic or sclerotic osseous abnormality. IMPRESSION: 1. Interval increase in size of 2 now dominant hepatic lesions consistent with disease progression. Assessment of liver parenchyma limited by noncontrast study. 2. Stable right lower lobe pulmonary nodule. 2nd pulmonary nodule seen previously not identified today. 3. Stable 20 mm right thyroid nodule. 4.  Emphysema. (ICD10-J43.9) 5.  Aortic Atherosclerois (ICD10-170.0) Electronically Signed   By: Misty Stanley M.D.   On: 02/22/2019 09:11   Ct Chest Wo Contrast  Result Date: 02/22/2019 CLINICAL DATA:  Intrahepatic cholangiocarcinoma EXAM: CT CHEST, ABDOMEN AND PELVIS WITHOUT CONTRAST TECHNIQUE: Multidetector CT imaging of the chest, abdomen and pelvis was performed following the standard protocol without IV contrast. COMPARISON:  None. FINDINGS: CT CHEST FINDINGS Cardiovascular: The heart size is normal. No substantial pericardial effusion. Coronary artery calcification is evident. Atherosclerotic calcification is noted in the wall of the thoracic aorta. Right Port-A-Cath tip is positioned in the distal SVC. Mediastinum/Nodes: No mediastinal lymphadenopathy. 20 mm hypoattenuating right thyroid nodule evident. No  evidence for gross hilar lymphadenopathy although assessment is limited by the lack of intravenous contrast on today's study. The esophagus has normal imaging features. There is no axillary lymphadenopathy. Lungs/Pleura:  Centrilobular emphsyema noted. 4 mm right lower lobe subpleural nodule (108/4) is stable. No new suspicious pulmonary nodule or mass. No pleural effusion. Musculoskeletal: No worrisome lytic or sclerotic osseous abnormality. CT ABDOMEN PELVIS FINDINGS Hepatobiliary: The dominant lesion in the inferior liver measured previously at 6.0 x 4.9 cm now measures 7.4 x 5.3 cm (70/2). 3.0 cm lesion inferior tip right liver (79/2) was 0.8 cm previously. Assessment of liver parenchyma limited by lack of intravenous contrast on today's study. No intrahepatic or extrahepatic biliary dilation. Pancreas: No focal mass lesion. No dilatation of the main duct. No intraparenchymal cyst. No peripancreatic edema. Spleen: No splenomegaly. No focal mass lesion. Adrenals/Urinary Tract: No adrenal nodule or mass. Right kidney unremarkable. Adjacent nonobstructive stones in the upper pole right kidney are similar. No evidence for hydroureter. Bladder decompressed. Stomach/Bowel: Stomach is unremarkable. No gastric wall thickening. No evidence of outlet obstruction. Duodenum is normally positioned as is the ligament of Treitz. No small bowel wall thickening. No small bowel dilatation. Diffuse colonic diverticulosis without diverticulitis. Vascular/Lymphatic: There is abdominal aortic atherosclerosis without aneurysm. Porta hepatis and hepato duodenal ligament not well evaluated due to lack of intravenous contrast material. No definite para-aortic lymphadenopathy. No pelvic sidewall lymphadenopathy. Reproductive: Limited assessment on noncontrast study. No adnexal mass. Other: Small volume intraperitoneal free fluid. Musculoskeletal: No worrisome lytic or sclerotic osseous abnormality. IMPRESSION: 1. Interval increase in size  of 2 now dominant hepatic lesions consistent with disease progression. Assessment of liver parenchyma limited by noncontrast study. 2. Stable right lower lobe pulmonary nodule. 2nd pulmonary nodule seen previously not identified today. 3. Stable 20 mm right thyroid nodule. 4.  Emphysema. (ICD10-J43.9) 5.  Aortic Atherosclerois (ICD10-170.0) Electronically Signed   By: Misty Stanley M.D.   On: 02/22/2019 09:11

## 2019-03-10 NOTE — Progress Notes (Signed)
Per Quincy Carnes it is okay to infuse 34fu over 44 hours.Pt has dialysis at 12 in [place]  so pump can be stopped at 1100.

## 2019-03-10 NOTE — Progress Notes (Signed)
Received critical lab value at 1156: Creatinine 5.47. PA Sandi Mealy made aware.

## 2019-03-11 LAB — CANCER ANTIGEN 19-9: CA 19-9: 45 U/mL — ABNORMAL HIGH (ref 0–35)

## 2019-03-12 ENCOUNTER — Other Ambulatory Visit: Payer: Self-pay

## 2019-03-12 ENCOUNTER — Inpatient Hospital Stay: Payer: Medicare Other

## 2019-03-12 VITALS — BP 153/76 | HR 78 | Temp 99.1°F | Resp 16

## 2019-03-12 DIAGNOSIS — Z5111 Encounter for antineoplastic chemotherapy: Secondary | ICD-10-CM | POA: Diagnosis not present

## 2019-03-12 DIAGNOSIS — C221 Intrahepatic bile duct carcinoma: Secondary | ICD-10-CM

## 2019-03-12 MED ORDER — HEPARIN SOD (PORK) LOCK FLUSH 100 UNIT/ML IV SOLN
500.0000 [IU] | Freq: Once | INTRAVENOUS | Status: AC | PRN
  Administered 2019-03-12: 500 [IU]
  Filled 2019-03-12: qty 5

## 2019-03-12 MED ORDER — SODIUM CHLORIDE 0.9% FLUSH
10.0000 mL | INTRAVENOUS | Status: DC | PRN
  Administered 2019-03-12: 10 mL
  Filled 2019-03-12: qty 10

## 2019-03-12 NOTE — Patient Instructions (Signed)

## 2019-03-23 NOTE — Progress Notes (Signed)
[name]   [phone] [phone]) [phone]   Clinic Follow up Note   Patient Care Team: Glendon Axe, MD as PCP - General (Family Medicine) [name], MD as PCP - Cardiology (Cardiology) Dwana Melena, MD as Attending Physician (Nephrology) Truitt Merle, MD as Consulting Physician (Hematology)  Date of Service:  03/24/2019  CHIEF COMPLAINT: f/u intrahepatic cholangiocarcinoma  SUMMARY OF ONCOLOGIC HISTORY: Oncology History  Intrahepatic cholangiocarcinoma ([place])  02/23/2018 Imaging   CT AP W Contrast 02/23/18  IMPRESSION: 1. Large multi-cystic lesion in the central aspect of the liver adjacent to the gallbladder fossa, with several smaller satellite lesions. These are all new compared to prior CT the chest, abdomen and pelvis 08/07/2016, concerning for intrahepatic abscesses. 2. Extensive mural thickening and inflammatory changes in the region of the cecum. This is nonspecific, and could reflect either right-sided diverticulitis, focal area of colitis, or potentially even underlying neoplasm. 3. Cholelithiasis. Gallbladder is nearly completely contracted without surrounding inflammatory changes to suggest an acute cholecystitis at this time. 4. Left-sided nephrolithiasis measuring up to 8 mm in the upper pole collecting system of left kidney. No ureteral stones or findings of urinary tract obstruction. 5. Aortic atherosclerosis.   02/27/2018 Initial Biopsy   Diagnosis 02/27/18  Liver, needle/core biopsy, Right Hepatic Lobe - ADENOCARCINOMA. SEE NOTE.   02/27/2018 Miscellaneous     03/05/2018 Procedure   Colonoscopy 03/05/18  IMPRESSION - Preparation of the colon was fair. - Non-thrombosed external hemorrhoids found on digital rectal exam. - There was significant looping of the colon. - Severe diverticulosis in the recto-sigmoid colon, in the sigmoid colon, in the descending colon, in the transverse colon, at the hepatic flexure, in the  ascending colon and in the cecum. There was no evidence of diverticular bleeding. - A single (solitary) ulcer in the cecum - highly concerning for underlying malignancy. Biopsied. Phlegmonous change is possible, though often in setting of complicated diverticulosis/diverticulitis ulceration would not be as common. - Erythematous mucosa in the transverse colon, at the hepatic flexure, in the ascending colon and in the cecum. Biopsied. - Normal mucosa in the rectum, in the recto-sigmoid colon, in the sigmoid colon and in the descending colon. Biopsied. - Non-bleeding non-thrombosed external and internal hemorrhoids. -----Negative for malignancy    03/11/2018 Initial Diagnosis   Intrahepatic cholangiocarcinoma ([place])   04/02/2018 Imaging   CT CAP W contrast 04/02/18  IMPRESSION: 1. 4 mm subpleural nodule in the periphery of the right lower lobe. This is nonspecific, but strongly favored to represent a benign subpleural lymph node. Attention on follow-up studies is recommended to exclude the possibility of metastatic disease. 2. Diffuse bronchial wall thickening with moderate centrilobular and mild paraseptal emphysema; imaging findings suggestive of underlying COPD. 3. Aortic atherosclerosis, in addition to left main and left anterior descending coronary artery disease. Assessment for potential risk factor modification, dietary therapy or pharmacologic therapy may be warranted, if clinically indicated. 4. Nonobstructive calculi in the upper pole collecting system of left kidney measuring up to 7 mm.    04/27/2018 Cancer Staging   Staging form: Intrahepatic Bile Duct, AJCC 8th Edition - Clinical: Stage IIIB (cT2, cN1, cM0) - Signed by Truitt Merle, MD on 04/27/2018   05/18/2018 - 02/10/2019 Chemotherapy   Gemcitabine and Cisplatin every 2 weeks starting on 05/18/2018, with 50% dose reduction. D/c after 02/10/19 due to disease progression.     07/19/2018 Imaging   CT CAP W CONTRAST    IMPRESSION: 1. Dominant inferior liver mass is decreased  in size. Two subcentimeter clustered low-attenuation lesions in the far inferior right liver lobe, not discretely visualized on the prior noncontrast CT study, can not exclude new small satellite hepatic metastases. 2. Porta hepatis adenopathy is mildly decreased. 3. Small right pulmonary nodules are stable. 4. No additional potential new or progressive metastatic disease. 5. Small pericardial effusion, slightly increased. 6. Cholelithiasis. 7. Moderate diffuse colonic diverticulosis. 8. Aortic Atherosclerosis (ICD10-I70.0) and Emphysema (ICD10-J43.9).    11/02/2018 Imaging   CT CAP WO contrast  IMPRESSION: 1. The dominant right hepatic lobe mass and most of the smaller right hepatic lobe lesions are stable. One of the right hepatic lobe lesions appears to have increased in size, formerly about 1.1 by 1.2 cm and currently 1.9 by 1.5 cm. 2. Two small right lower lobe pulmonary nodules are stable in size. 3. A hypodense right thyroid nodule has enlarged, currently 1.9 by 1.2 cm and formerly 1.1 by 0.7 cm. Consider thyroid ultrasound for further evaluation. 4. Other imaging findings of potential clinical significance: Aortic Atherosclerosis (ICD10-I70.0) and Emphysema (ICD10-J43.9). Coronary atherosclerosis. Mild cardiomegaly. Low-density blood pool suggests anemia. Nonobstructive left nephrolithiasis. Colonic diverticulosis.   02/22/2019 Imaging   CT CAP WO Contrast  IMPRESSION: 1. Interval increase in size of 2 now dominant hepatic lesions consistent with disease progression. Assessment of liver parenchyma limited by noncontrast study. 2. Stable right lower lobe pulmonary nodule. 2nd pulmonary nodule seen previously not identified today. 3. Stable 20 mm right thyroid nodule. 4.  Emphysema. (ICD10-J43.9) 5.  Aortic Atherosclerois (ICD10-170.0)   02/24/2019 -  Chemotherapy   Second-line FOLFOX  with dose reduction  starting 02/24/19       CURRENT THERAPY:  Second-line FOLFOX starting 02/24/19   INTERVAL HISTORY:  Mary Chapman is here for a follow up and treatment. She presents to the clinic alone. She notes she is doing fair. She feels her first two cycles went well. She notes having more right abdominal pain lately. She notes she has been taking two 5mg  oxycodone once a day in the evening. She notes having manageable nausea but has stable weight.    REVIEW OF SYSTEMS:   Constitutional: Denies fevers, chills or abnormal weight loss Eyes: Denies blurriness of vision Ears, nose, mouth, throat, and face: Denies mucositis or sore throat Respiratory: Denies cough, dyspnea or wheezes Cardiovascular: Denies palpitation, chest discomfort or lower extremity swelling Gastrointestinal:  Denies nausea, heartburn or change in bowel habits (+) Right abdominal pain  Skin: Denies abnormal skin rashes Lymphatics: Denies new lymphadenopathy or easy bruising Neurological:Denies numbness, tingling or new weaknesses Behavioral/Psych: Mood is stable, no new changes  All other systems were reviewed with the patient and are negative.  MEDICAL HISTORY:  Past Medical History:  Diagnosis Date   Diverticulitis    Focal segmental glomerulosclerosis    ESRD on MWF HD   Hypertension    intrahepatic cholangio ca dx'd10/2019   Renal insufficiency    dialysis pt MWF   Tobacco abuse     SURGICAL HISTORY: Past Surgical History:  Procedure Laterality Date   AV FISTULA PLACEMENT Left 03/18/2017   Procedure: Brachiocephalic ARTERIOVENOUS (AV) FISTULA CREATION left arm;  Surgeon: Elam Dutch, MD;  Location: [facility] OR;  Service: Vascular;  Laterality: Left;   BIOPSY  03/05/2018   Procedure: BIOPSY;  Surgeon: Irving Copas., MD;  Location: [facility] ENDOSCOPY;  Service: Gastroenterology;;  enteroscopy bx /cytology brushing Greig Castilla bx   COLONOSCOPY WITH PROPOFOL N/A 03/05/2018   Procedure: COLONOSCOPY WITH  PROPOFOL;  Surgeon: Justice Britain  Brooke Bonito., MD;  Location: [facility];  Service: Gastroenterology;  Laterality: N/A;  Bx, Spot   ENTEROSCOPY N/A 03/05/2018   Procedure: ENTEROSCOPY;  Surgeon: Mansouraty, Telford Nab., MD;  Location: [facility] ENDOSCOPY;  Service: Gastroenterology;  Laterality: N/A;  BX, Brushing, & Spot   INSERTION OF DIALYSIS CATHETER N/A 03/18/2017   Procedure: INSERTION OF DIALYSIS CATHETER;  Surgeon: Elam Dutch, MD;  Location: [place];  Service: Vascular;  Laterality: N/A;   IR GENERIC HISTORICAL  08/15/2016   IR US GUIDE VASC ACCESS RIGHT 08/15/2016 Arne Cleveland, MD [facility]   IR GENERIC HISTORICAL  08/15/2016   IR FLUORO GUIDE CV LINE RIGHT 08/15/2016 Arne Cleveland, MD [facility]   IR IMAGING GUIDED PORT INSERTION  05/06/2018   SUBMUCOSAL INJECTION  03/05/2018   Procedure: SUBMUCOSAL INJECTION;  Surgeon: Irving Copas., MD;  Location: [place];  Service: Gastroenterology;;  spot tatoo   TUBAL LIGATION      I have reviewed the social history and family history with the patient and they are unchanged from previous note.  ALLERGIES:  has No Known Allergies.  MEDICATIONS:  Current Outpatient Medications  Medication Sig Dispense Refill   amLODipine (NORVASC) 10 MG tablet      folic acid (FOLVITE) 1 MG tablet Take 1 tablet (1 mg total) by mouth daily. 30 tablet 0   gabapentin (NEURONTIN) 100 MG capsule TAKE 1 CAPSULE (100 MG TOTAL) BY MOUTH AT BEDTIME. 90 capsule 2   metoprolol tartrate (LOPRESSOR) 25 MG tablet Take 0.5 tablets (12.5 mg total) by mouth 2 (two) times daily. 30 tablet 0   multivitamin (RENA-VIT) TABS tablet Take 1 tablet by mouth at bedtime. 30 tablet 3   Nutritional Supplements (FEEDING SUPPLEMENT, NEPRO CARB STEADY,) LIQD Take 237 mLs by mouth 3 (three) times daily as needed (Supplement). 90 Can 0   oxyCODONE (OXY IR/ROXICODONE) 5 MG immediate release tablet Take 1 tablet (5 mg total) by mouth every 6 (six) hours as  needed for severe pain. 30 tablet 0   pantoprazole (PROTONIX) 40 MG tablet TAKE 1 TABLET BY MOUTH TWICE A DAY 60 tablet 1   polyethylene glycol (MIRALAX) packet Take 17 g by mouth daily. 30 each 0   senna-docusate (SENOKOT-S) 8.6-50 MG tablet Take 2 tablets by mouth 2 (two) times daily. 30 tablet 0   Vitamin D, Ergocalciferol, (DRISDOL) 50000 units CAPS capsule Take 50,000 Units by mouth once a week.  5   Oxycodone HCl 10 MG TABS Take 1 tablet (10 mg total) by mouth every 8 (eight) hours as needed. 60 tablet 0   No current facility-administered medications for this visit.    Facility-Administered Medications Ordered in Other Visits  Medication Dose Route Frequency Provider Last Rate Last Dose   fluorouracil (ADRUCIL) 3,500 mg in sodium chloride 0.9 % 80 mL chemo infusion  2,000 mg/m2 (Treatment Plan Recorded) Intravenous 1 day or 1 dose Truitt Merle, MD   3,500 mg at 03/24/19 1549    PHYSICAL EXAMINATION: ECOG PERFORMANCE STATUS: 1 - Symptomatic but completely ambulatory  Vitals:   03/24/19 1103  BP: (!) 185/91  Pulse: 80  Resp: 17  Temp: 98.7 F (37.1 C)  SpO2: 100%   Filed Weights   03/24/19 1103  Weight: 144 lb 11.2 oz (65.6 kg)    GENERAL:alert, no distress and comfortable SKIN: skin color, texture, turgor are normal, no rashes or significant lesions EYES: normal, Conjunctiva are pink and non-injected, sclera clear  NECK: supple, thyroid normal size, non-tender, without nodularity LYMPH:  no palpable lymphadenopathy in the cervical, axillary  LUNGS: clear to auscultation and percussion with normal breathing effort HEART: regular rate & rhythm and no murmurs and no lower extremity edema ABDOMEN:abdomen soft, non-tender and normal bowel sounds Musculoskeletal:no cyanosis of digits and no clubbing  NEURO: alert & oriented x 3 with fluent speech, no focal motor/sensory deficits  LABORATORY DATA:  I have reviewed the data as listed CBC Latest Ref Rng & Units 03/24/2019  03/10/2019 02/24/2019  WBC 4.0 - 10.5 K/uL 6.3 7.0 6.6  Hemoglobin 12.0 - 15.0 g/dL 9.8(L) 9.5(L) 8.5(L)  Hematocrit 36.0 - 46.0 % 31.9(L) 30.3(L) 26.5(L)  Platelets 150 - 400 K/uL 187 236 240     CMP Latest Ref Rng & Units 03/24/2019 03/10/2019 02/24/2019  Glucose 70 - 99 mg/dL 85 89 94  BUN 8 - 23 mg/dL 25(H) 21 28(H)  Creatinine 0.44 - 1.00 mg/dL 5.27(HH) 5.47(HH) 5.45(HH)  Sodium 135 - 145 mmol/L 139 142 138  Potassium 3.5 - 5.1 mmol/L 3.8 3.6 3.8  Chloride 98 - 111 mmol/L 98 103 100  CO2 22 - 32 mmol/L 27 27 29   Calcium 8.9 - 10.3 mg/dL 8.1(L) 7.8(L) 7.8(L)  Total Protein 6.5 - 8.1 g/dL 7.0 6.9 6.6  Total Bilirubin 0.3 - 1.2 mg/dL 0.5 0.5 0.4  Alkaline Phos 38 - 126 U/L 121 134(H) 138(H)  AST 15 - 41 U/L 14(L) 14(L) 14(L)  ALT 0 - 44 U/L <6 <6 8      RADIOGRAPHIC STUDIES: I have personally reviewed the radiological images as listed and agreed with the findings in the report. No results found.   ASSESSMENT & PLAN:  Mary Chapman is a 73 y.o. female with   1. Adenocarcinomainliver, likelyintrahepatic cholangiocarcinoma, cT2N1M0 stage IIIB, unresectable, MS-Stable -Diagnosed in 02/2018. She is not eligible for surgery due to tumor size and location.She was seen by Dr. Cloyd Stagers at [facility].  -She was initially treated with first-line Cisplatin/Gemcitabine q2weekssince 12/2019but unfortunately had disease progression as seen on 02/2019 CT CAP.  -To better control her disease I started her on second line FOLFOX with dose reduction every 2 weeks on 02/24/19.  -S/p cycle 2 she is tolerating FOLFOX mostly well with mild nausea, stable weight. She does have increase in her right abdominal pain. Plan to scan her after C4 or C6 depends on her tumor marker trend. She has mildly worse abdominal pain which is concerning for disease progression -Labs reviewed and adequate to proceed with C3 FOLFOX today.  -F/u in 2 weeks before cycle 4  2. Right abdominal pain  -Secondary to #1  -Pain has  increased recently. She is currently taking 2 tabs of Oxycodone 5mg  in the evenings. I refilled  And increased to Oxycodone 5mg  q8hours (03/24/19)  3. HTN  -Currently on metoprolol.stable  3. End Stage Renal Disease, on HD MWF -Treated with dialysis, tolerating well. Continue to f/u with nephrologist Dr. Augustin Coupe -Given chemo pump D/c is on every other Saturday I recommend she change her Dialysis time to later on Saturdays, she is agreeable.    4. Anemia of chronic diseaseand chemo induced anemia, secondary to CKD and Malignancy  -She has received blood transfusion intermittently   -Recently restarted her on EPO at her dialysis center due to her worsening anemia  -stable overall   5.Goal of care discussion  -The patient understands the goal of care is palliative, to prolong his life -she is full code now  6. 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 -Controlled on 100mg  nightly Gabapentin.Given her ESRD she can take up to 200mg  at night and if needed 1 tab during the day.  -mild and stable mainly of LE, will watch closely when she is on oxaliplatin    Plan -Labs reviewed and adequate to proceed with C3 FOLFOX today  -Lab, flush, f/u and FOLFOX in 2, 4, 6, 8 weeks    No problem-specific Assessment & Plan notes found for this encounter.   No orders of the defined types were placed in this encounter.  All questions were answered. The patient knows to call the clinic with any problems, questions or concerns. No barriers to learning was detected. I spent 20 minutes counseling the patient face to face. The total time spent in the appointment was 25 minutes and more than 50% was on counseling and review of test results     Truitt Merle, MD 03/24/2019   I, Joslyn Devon, am acting as scribe for Truitt Merle, MD.   I have reviewed the above documentation for accuracy and completeness, and I agree with the above.

## 2019-03-24 ENCOUNTER — Other Ambulatory Visit: Payer: Self-pay

## 2019-03-24 ENCOUNTER — Telehealth: Payer: Self-pay | Admitting: Hematology

## 2019-03-24 ENCOUNTER — Telehealth: Payer: Self-pay | Admitting: *Deleted

## 2019-03-24 ENCOUNTER — Inpatient Hospital Stay: Payer: Medicare Other | Attending: Hematology

## 2019-03-24 ENCOUNTER — Inpatient Hospital Stay (HOSPITAL_BASED_OUTPATIENT_CLINIC_OR_DEPARTMENT_OTHER): Payer: Medicare Other | Admitting: Hematology

## 2019-03-24 ENCOUNTER — Inpatient Hospital Stay: Payer: Medicare Other

## 2019-03-24 ENCOUNTER — Encounter: Payer: Self-pay | Admitting: Hematology

## 2019-03-24 VITALS — BP 185/91 | HR 80 | Temp 98.7°F | Resp 17 | Ht 66.0 in | Wt 144.7 lb

## 2019-03-24 DIAGNOSIS — Z992 Dependence on renal dialysis: Secondary | ICD-10-CM | POA: Diagnosis not present

## 2019-03-24 DIAGNOSIS — Z452 Encounter for adjustment and management of vascular access device: Secondary | ICD-10-CM | POA: Insufficient documentation

## 2019-03-24 DIAGNOSIS — C221 Intrahepatic bile duct carcinoma: Secondary | ICD-10-CM

## 2019-03-24 DIAGNOSIS — Z5111 Encounter for antineoplastic chemotherapy: Secondary | ICD-10-CM | POA: Insufficient documentation

## 2019-03-24 DIAGNOSIS — N186 End stage renal disease: Secondary | ICD-10-CM | POA: Insufficient documentation

## 2019-03-24 DIAGNOSIS — G620 Drug-induced polyneuropathy: Secondary | ICD-10-CM | POA: Insufficient documentation

## 2019-03-24 DIAGNOSIS — D631 Anemia in chronic kidney disease: Secondary | ICD-10-CM | POA: Diagnosis not present

## 2019-03-24 DIAGNOSIS — I120 Hypertensive chronic kidney disease with stage 5 chronic kidney disease or end stage renal disease: Secondary | ICD-10-CM | POA: Insufficient documentation

## 2019-03-24 DIAGNOSIS — I10 Essential (primary) hypertension: Secondary | ICD-10-CM

## 2019-03-24 DIAGNOSIS — Z95828 Presence of other vascular implants and grafts: Secondary | ICD-10-CM

## 2019-03-24 LAB — CBC WITH DIFFERENTIAL (CANCER CENTER ONLY)
Abs Immature Granulocytes: 0.02 10*3/uL (ref 0.00–0.07)
Basophils Absolute: 0 10*3/uL (ref 0.0–0.1)
Basophils Relative: 0 %
Eosinophils Absolute: 0.5 10*3/uL (ref 0.0–0.5)
Eosinophils Relative: 8 %
HCT: 31.9 % — ABNORMAL LOW (ref 36.0–46.0)
Hemoglobin: 9.8 g/dL — ABNORMAL LOW (ref 12.0–15.0)
Immature Granulocytes: 0 %
Lymphocytes Relative: 13 %
Lymphs Abs: 0.8 10*3/uL (ref 0.7–4.0)
MCH: 30.5 pg (ref 26.0–34.0)
MCHC: 30.7 g/dL (ref 30.0–36.0)
MCV: 99.4 fL (ref 80.0–100.0)
Monocytes Absolute: 0.9 10*3/uL (ref 0.1–1.0)
Monocytes Relative: 14 %
Neutro Abs: 4 10*3/uL (ref 1.7–7.7)
Neutrophils Relative %: 65 %
Platelet Count: 187 10*3/uL (ref 150–400)
RBC: 3.21 MIL/uL — ABNORMAL LOW (ref 3.87–5.11)
RDW: 18.4 % — ABNORMAL HIGH (ref 11.5–15.5)
WBC Count: 6.3 10*3/uL (ref 4.0–10.5)
nRBC: 0 % (ref 0.0–0.2)

## 2019-03-24 LAB — CMP (CANCER CENTER ONLY)
ALT: <6 U/L (ref 0–44)
AST: 14 U/L — ABNORMAL LOW (ref 15–41)
Albumin: 3.1 g/dL — ABNORMAL LOW (ref 3.5–5.0)
Alkaline Phosphatase: 121 U/L (ref 38–126)
Anion gap: 14 (ref 5–15)
BUN: 25 mg/dL — ABNORMAL HIGH (ref 8–23)
CO2: 27 mmol/L (ref 22–32)
Calcium: 8.1 mg/dL — ABNORMAL LOW (ref 8.9–10.3)
Chloride: 98 mmol/L (ref 98–111)
Creatinine: 5.27 mg/dL (ref 0.44–1.00)
GFR, Est AFR Am: 9 mL/min — ABNORMAL LOW (ref >60)
GFR, Estimated: 8 mL/min — ABNORMAL LOW (ref >60)
Glucose, Bld: 85 mg/dL (ref 70–99)
Potassium: 3.8 mmol/L (ref 3.5–5.1)
Sodium: 139 mmol/L (ref 135–145)
Total Bilirubin: 0.5 mg/dL (ref 0.3–1.2)
Total Protein: 7 g/dL (ref 6.5–8.1)

## 2019-03-24 MED ORDER — SODIUM CHLORIDE 0.9 % IV SOLN
2000.0000 mg/m2 | INTRAVENOUS | Status: DC
  Administered 2019-03-24: 3500 mg via INTRAVENOUS
  Filled 2019-03-24: qty 70

## 2019-03-24 MED ORDER — DEXTROSE 5 % IV SOLN
Freq: Once | INTRAVENOUS | Status: AC
  Administered 2019-03-24: 12:00:00 via INTRAVENOUS
  Filled 2019-03-24: qty 250

## 2019-03-24 MED ORDER — SODIUM CHLORIDE 0.9% FLUSH
10.0000 mL | INTRAVENOUS | Status: DC | PRN
  Administered 2019-03-24: 11:00:00 10 mL
  Filled 2019-03-24: qty 10

## 2019-03-24 MED ORDER — OXYCODONE HCL 10 MG PO TABS: 10.0000 mg | ORAL_TABLET | Freq: Three times a day (TID) | ORAL | 0 refills | Status: DC | PRN

## 2019-03-24 MED ORDER — PALONOSETRON HCL INJECTION 0.25 MG/5ML
0.2500 mg | Freq: Once | INTRAVENOUS | Status: AC
  Administered 2019-03-24: 0.25 mg via INTRAVENOUS

## 2019-03-24 MED ORDER — OXALIPLATIN CHEMO INJECTION 100 MG/20ML
50.0000 mg/m2 | Freq: Once | INTRAVENOUS | Status: AC
  Administered 2019-03-24: 85 mg via INTRAVENOUS
  Filled 2019-03-24: qty 17

## 2019-03-24 MED ORDER — PALONOSETRON HCL INJECTION 0.25 MG/5ML
INTRAVENOUS | Status: AC
  Filled 2019-03-24: qty 5

## 2019-03-24 MED ORDER — DEXTROSE 5 % IV SOLN
Freq: Once | INTRAVENOUS | Status: AC
  Administered 2019-03-24: 13:00:00 via INTRAVENOUS
  Filled 2019-03-24: qty 250

## 2019-03-24 MED ORDER — DEXAMETHASONE SODIUM PHOSPHATE 10 MG/ML IJ SOLN
10.0000 mg | Freq: Once | INTRAMUSCULAR | Status: AC
  Administered 2019-03-24: 10 mg via INTRAVENOUS

## 2019-03-24 MED ORDER — LEUCOVORIN CALCIUM INJECTION 350 MG
400.0000 mg/m2 | Freq: Once | INTRAVENOUS | Status: AC
  Administered 2019-03-24: 696 mg via INTRAVENOUS
  Filled 2019-03-24: qty 34.8

## 2019-03-24 MED ORDER — DEXAMETHASONE SODIUM PHOSPHATE 10 MG/ML IJ SOLN
INTRAMUSCULAR | Status: AC
  Filled 2019-03-24: qty 1

## 2019-03-24 NOTE — Telephone Encounter (Signed)
Scheduled appt per 11/5 los.  Pt will get a print out at her next scheduled appt.

## 2019-03-24 NOTE — Patient Instructions (Signed)
[facility] Discharge Instructions for Patients Receiving Chemotherapy  Today you received the following chemotherapy agents :  Oxaliplatin,  Leucovorin,  Fluorouracil.  To help prevent nausea and vomiting after your treatment, we encourage you to take your nausea medication as prescribed.   If you develop nausea and vomiting that is not controlled by your nausea medication, call the clinic.   BELOW ARE SYMPTOMS THAT SHOULD BE REPORTED IMMEDIATELY:  *FEVER GREATER THAN 100.5 F  *CHILLS WITH OR WITHOUT FEVER  NAUSEA AND VOMITING THAT IS NOT CONTROLLED WITH YOUR NAUSEA MEDICATION  *UNUSUAL SHORTNESS OF BREATH  *UNUSUAL BRUISING OR BLEEDING  TENDERNESS IN MOUTH AND THROAT WITH OR WITHOUT PRESENCE OF ULCERS  *URINARY PROBLEMS  *BOWEL PROBLEMS  UNUSUAL RASH Items with * indicate a potential emergency and should be followed up as soon as possible.  Feel free to call the clinic should you have any questions or concerns. The clinic phone number is [phone].  Please show the CHEMO ALERT CARD at check-in to the Emergency Department and triage nurse.   

## 2019-03-24 NOTE — Progress Notes (Signed)
Per Dr. Burr Medico,  [place] to treat with Crea 5.27 today. CI chemo infusion pump started at 1555.  Spoke with Dr. Burr Medico, increased rate to 44 hours and discard remaining chemo left in bag when pt comes in for pump d/c at 1030 am on Sat. 03/26/19.  Pt has dialysis appt on Sat 11/7 at  1130 am.  Explanations given to pt.  Pt voiced understanding.  Theadora Rama, pharmacist notified of MD's instructions.

## 2019-03-24 NOTE — Telephone Encounter (Signed)
CRITICAL VALUE STICKER  CRITICAL VALUE: Creat = 5.27.  RECEIVER (on-site recipient of call): Mining engineer, Triage [place].   DATE & TIME NOTIFIED: 03/24/2019 at 1142.   MESSENGER (representative from lab): Corbin Ade [facility].  MD NOTIFIED: Collaborative nurse.  TIME OF NOTIFICATION: 03/24/2019 at 1208.  RESPONSE: None.

## 2019-03-26 ENCOUNTER — Inpatient Hospital Stay: Payer: Medicare Other

## 2019-03-26 VITALS — BP 185/94 | HR 74 | Temp 98.5°F | Resp 16

## 2019-03-26 DIAGNOSIS — Z5111 Encounter for antineoplastic chemotherapy: Secondary | ICD-10-CM | POA: Diagnosis not present

## 2019-03-26 DIAGNOSIS — C221 Intrahepatic bile duct carcinoma: Secondary | ICD-10-CM

## 2019-03-26 MED ORDER — HEPARIN SOD (PORK) LOCK FLUSH 100 UNIT/ML IV SOLN
500.0000 [IU] | Freq: Once | INTRAVENOUS | Status: AC | PRN
  Administered 2019-03-26: 500 [IU]
  Filled 2019-03-26: qty 5

## 2019-03-26 MED ORDER — SODIUM CHLORIDE 0.9% FLUSH
10.0000 mL | INTRAVENOUS | Status: DC | PRN
  Administered 2019-03-26: 10 mL
  Filled 2019-03-26: qty 10

## 2019-04-06 NOTE — Progress Notes (Signed)
[place]   [phone] [phone]   Clinic Follow up Note   Patient Care Team: Glendon Axe, MD as PCP - General (Family Medicine) [name], MD as PCP - Cardiology (Cardiology) Dwana Melena, MD as Attending Physician (Nephrology) Truitt Merle, MD as Consulting Physician (Hematology)  Date of Service:  04/07/2019  CHIEF COMPLAINT: f/u intrahepatic cholangiocarcinoma  SUMMARY OF ONCOLOGIC HISTORY: Oncology History  Intrahepatic cholangiocarcinoma ([place])  02/23/2018 Imaging   CT AP W Contrast 02/23/18  IMPRESSION: 1. Large multi-cystic lesion in the central aspect of the liver adjacent to the gallbladder fossa, with several smaller satellite lesions. These are all new compared to prior CT the chest, abdomen and pelvis 08/07/2016, concerning for intrahepatic abscesses. 2. Extensive mural thickening and inflammatory changes in the region of the cecum. This is nonspecific, and could reflect either right-sided diverticulitis, focal area of colitis, or potentially even underlying neoplasm. 3. Cholelithiasis. Gallbladder is nearly completely contracted without surrounding inflammatory changes to suggest an acute cholecystitis at this time. 4. Left-sided nephrolithiasis measuring up to 8 mm in the upper pole collecting system of left kidney. No ureteral stones or findings of urinary tract obstruction. 5. Aortic atherosclerosis.   02/27/2018 Initial Biopsy   Diagnosis 02/27/18  Liver, needle/core biopsy, Right Hepatic Lobe - ADENOCARCINOMA. SEE NOTE.   02/27/2018 Miscellaneous     03/05/2018 Procedure   Colonoscopy 03/05/18  IMPRESSION - Preparation of the colon was fair. - Non-thrombosed external hemorrhoids found on digital rectal exam. - There was significant looping of the colon. - Severe diverticulosis in the recto-sigmoid colon, in the sigmoid colon, in the descending colon, in the transverse colon, at the hepatic flexure, in the  ascending colon and in the cecum. There was no evidence of diverticular bleeding. - A single (solitary) ulcer in the cecum - highly concerning for underlying malignancy. Biopsied. Phlegmonous change is possible, though often in setting of complicated diverticulosis/diverticulitis ulceration would not be as common. - Erythematous mucosa in the transverse colon, at the hepatic flexure, in the ascending colon and in the cecum. Biopsied. - Normal mucosa in the rectum, in the recto-sigmoid colon, in the sigmoid colon and in the descending colon. Biopsied. - Non-bleeding non-thrombosed external and internal hemorrhoids. -----Negative for malignancy    03/11/2018 Initial Diagnosis   Intrahepatic cholangiocarcinoma ([place])   04/02/2018 Imaging   CT CAP W contrast 04/02/18  IMPRESSION: 1. 4 mm subpleural nodule in the periphery of the right lower lobe. This is nonspecific, but strongly favored to represent a benign subpleural lymph node. Attention on follow-up studies is recommended to exclude the possibility of metastatic disease. 2. Diffuse bronchial wall thickening with moderate centrilobular and mild paraseptal emphysema; imaging findings suggestive of underlying COPD. 3. Aortic atherosclerosis, in addition to left main and left anterior descending coronary artery disease. Assessment for potential risk factor modification, dietary therapy or pharmacologic therapy may be warranted, if clinically indicated. 4. Nonobstructive calculi in the upper pole collecting system of left kidney measuring up to 7 mm.    04/27/2018 Cancer Staging   Staging form: Intrahepatic Bile Duct, AJCC 8th Edition - Clinical: Stage IIIB (cT2, cN1, cM0) - Signed by Truitt Merle, MD on 04/27/2018   05/18/2018 - 02/10/2019 Chemotherapy   Gemcitabine and Cisplatin every 2 weeks starting on 05/18/2018, with 50% dose reduction. D/c after 02/10/19 due to disease progression.     07/19/2018 Imaging   CT CAP W CONTRAST   IMPRESSION: 1. Dominant inferior liver mass is decreased in  size. Two subcentimeter clustered low-attenuation lesions in the far inferior right liver lobe, not discretely visualized on the prior noncontrast CT study, can not exclude new small satellite hepatic metastases. 2. Porta hepatis adenopathy is mildly decreased. 3. Small right pulmonary nodules are stable. 4. No additional potential new or progressive metastatic disease. 5. Small pericardial effusion, slightly increased. 6. Cholelithiasis. 7. Moderate diffuse colonic diverticulosis. 8. Aortic Atherosclerosis (ICD10-I70.0) and Emphysema (ICD10-J43.9).    11/02/2018 Imaging   CT CAP WO contrast  IMPRESSION: 1. The dominant right hepatic lobe mass and most of the smaller right hepatic lobe lesions are stable. One of the right hepatic lobe lesions appears to have increased in size, formerly about 1.1 by 1.2 cm and currently 1.9 by 1.5 cm. 2. Two small right lower lobe pulmonary nodules are stable in size. 3. A hypodense right thyroid nodule has enlarged, currently 1.9 by 1.2 cm and formerly 1.1 by 0.7 cm. Consider thyroid ultrasound for further evaluation. 4. Other imaging findings of potential clinical significance: Aortic Atherosclerosis (ICD10-I70.0) and Emphysema (ICD10-J43.9). Coronary atherosclerosis. Mild cardiomegaly. Low-density blood pool suggests anemia. Nonobstructive left nephrolithiasis. Colonic diverticulosis.   02/22/2019 Imaging   CT CAP WO Contrast  IMPRESSION: 1. Interval increase in size of 2 now dominant hepatic lesions consistent with disease progression. Assessment of liver parenchyma limited by noncontrast study. 2. Stable right lower lobe pulmonary nodule. 2nd pulmonary nodule seen previously not identified today. 3. Stable 20 mm right thyroid nodule. 4.  Emphysema. (ICD10-J43.9) 5.  Aortic Atherosclerois (ICD10-170.0)   02/24/2019 -  Chemotherapy   Second-line FOLFOX  with dose reduction starting  02/24/19       CURRENT THERAPY:  Second-line FOLFOX starting 02/24/19   INTERVAL HISTORY:  Mary Chapman is here for a follow up and treatment. She presents to the clinic alone. She notes she is stable. She takes Oxycodone 10mg  as needed, not everyday. She notes her abdominal pain is controlled with this. She notes her energy is low due to fatigue. She can still get through her activities. She notes she eats adequately even if she makes herself. She notes being more nauseous with this regimen. She denies cold sensitivity or neuropathy with Gabapentin. She sees a different Dialysis doctor once a week. She will see Dr. Augustin Coupe almost once a month. She notes her BP is always high even at Dialysis Center. She still takes amlodipine and metoprolol.    REVIEW OF SYSTEMS:   Constitutional: Denies fevers, chills (+) Low appetite (+) fatigue  Eyes: Denies blurriness of vision Ears, nose, mouth, throat, and face: Denies mucositis or sore throat Respiratory: Denies cough, dyspnea or wheezes Cardiovascular: Denies palpitation, chest discomfort or lower extremity swelling Gastrointestinal:  Denies heartburn or change in bowel habits (+) Improved abdominal pain (+) nausea  Skin: Denies abnormal skin rashes Lymphatics: Denies new lymphadenopathy or easy bruising Neurological:Denies numbness, tingling or new weaknesses Behavioral/Psych: Mood is stable, no new changes  All other systems were reviewed with the patient and are negative.  MEDICAL HISTORY:  Past Medical History:  Diagnosis Date  . Diverticulitis   . Focal segmental glomerulosclerosis    ESRD on MWF HD  . Hypertension   . intrahepatic cholangio ca dx'd10/2019  . Renal insufficiency    dialysis pt MWF  . Tobacco abuse     SURGICAL HISTORY: Past Surgical History:  Procedure Laterality Date  . AV FISTULA PLACEMENT Left 03/18/2017   Procedure: Brachiocephalic ARTERIOVENOUS (AV) FISTULA CREATION left arm;  Surgeon: Elam Dutch, MD;  Location: [facility];  Service: Vascular;  Laterality: Left;  . BIOPSY  03/05/2018   Procedure: BIOPSY;  Surgeon: Rush Landmark Telford Nab., MD;  Location: [facility];  Service: Gastroenterology;;  enteroscopy bx /cytology brushing Greig Castilla bx  . COLONOSCOPY WITH PROPOFOL N/A 03/05/2018   Procedure: COLONOSCOPY WITH PROPOFOL;  Surgeon: Rush Landmark Telford Nab., MD;  Location: [facility];  Service: Gastroenterology;  Laterality: N/A;  Bx, Spot  . ENTEROSCOPY N/A 03/05/2018   Procedure: ENTEROSCOPY;  Surgeon: Rush Landmark Telford Nab., MD;  Location: [place] [facility];  Service: Gastroenterology;  Laterality: N/A;  BX, Brushing, & Spot  . INSERTION OF DIALYSIS CATHETER N/A 03/18/2017   Procedure: INSERTION OF DIALYSIS CATHETER;  Surgeon: Elam Dutch, MD;  Location: [facility];  Service: Vascular;  Laterality: N/A;  . IR GENERIC HISTORICAL  08/15/2016   IR US GUIDE VASC ACCESS RIGHT 08/15/2016 Arne Cleveland, MD [facility]  . IR GENERIC HISTORICAL  08/15/2016   IR FLUORO GUIDE CV LINE RIGHT 08/15/2016 Arne Cleveland, MD [facility]  . IR IMAGING GUIDED PORT INSERTION  05/06/2018  . SUBMUCOSAL INJECTION  03/05/2018   Procedure: SUBMUCOSAL INJECTION;  Surgeon: Rush Landmark Telford Nab., MD;  Location: [place];  Service: Gastroenterology;;  spot tatoo  . TUBAL LIGATION      I have reviewed the social history and family history with the patient and they are unchanged from previous note.  ALLERGIES:  has No Known Allergies.  MEDICATIONS:  Current Outpatient Medications  Medication Sig Dispense Refill  . amLODipine (NORVASC) 10 MG tablet Take by mouth at bedtime.     . folic acid (FOLVITE) 1 MG tablet Take 1 tablet (1 mg total) by mouth daily. 30 tablet 0  . gabapentin (NEURONTIN) 100 MG capsule TAKE 1 CAPSULE (100 MG TOTAL) BY MOUTH AT BEDTIME. 90 capsule 2  . metoprolol tartrate (LOPRESSOR) 25 MG tablet Take 0.5 tablets (12.5 mg total) by mouth 2 (two) times daily. 30 tablet 0  . multivitamin  (RENA-VIT) TABS tablet Take 1 tablet by mouth at bedtime. 30 tablet 3  . Nutritional Supplements (FEEDING SUPPLEMENT, NEPRO CARB STEADY,) LIQD Take 237 mLs by mouth 3 (three) times daily as needed (Supplement). 90 Can 0  . oxyCODONE (OXY IR/ROXICODONE) 5 MG immediate release tablet Take 1 tablet (5 mg total) by mouth every 6 (six) hours as needed for severe pain. 30 tablet 0  . Oxycodone HCl 10 MG TABS Take 1 tablet (10 mg total) by mouth every 8 (eight) hours as needed. 60 tablet 0  . pantoprazole (PROTONIX) 40 MG tablet TAKE 1 TABLET BY MOUTH TWICE A DAY 60 tablet 1  . polyethylene glycol (MIRALAX) packet Take 17 g by mouth daily. 30 each 0  . senna-docusate (SENOKOT-S) 8.6-50 MG tablet Take 2 tablets by mouth 2 (two) times daily. 30 tablet 0  . Vitamin D, Ergocalciferol, (DRISDOL) 50000 units CAPS capsule Take 50,000 Units by mouth once a week.  5   No current facility-administered medications for this visit.    Facility-Administered Medications Ordered in Other Visits  Medication Dose Route Frequency Provider Last Rate Last Dose  . fluorouracil (ADRUCIL) 3,500 mg in sodium chloride 0.9 % 80 mL chemo infusion  2,000 mg/m2 (Treatment Plan Recorded) Intravenous 1 day or 1 dose Truitt Merle, MD   3,500 mg at 04/07/19 1441  . sodium chloride flush (NS) 0.9 % injection 10 mL  10 mL Intracatheter PRN Truitt Merle, MD   10 mL at 04/07/19 1439    PHYSICAL EXAMINATION: ECOG PERFORMANCE STATUS:  2 - Symptomatic, <50% confined to bed  Vitals:   04/07/19 1031  BP: (!) 179/96  Pulse: 72  Resp: 17  Temp: 98 F (36.7 C)  SpO2: 100%   Filed Weights   04/07/19 1031  Weight: 143 lb 9.6 oz (65.1 kg)    GENERAL:alert, no distress and comfortable SKIN: skin color, texture, turgor are normal, no rashes or significant lesions EYES: normal, Conjunctiva are pink and non-injected, sclera clear  NECK: supple, thyroid normal size, non-tender, without nodularity LYMPH:  no palpable lymphadenopathy in the  cervical, axillary  LUNGS: clear to auscultation and percussion with normal breathing effort HEART: regular rate & rhythm and no murmurs and no lower extremity edema ABDOMEN:abdomen soft, non-tender and normal bowel sounds Musculoskeletal:no cyanosis of digits and no clubbing  NEURO: alert & oriented x 3 with fluent speech, no focal motor/sensory deficits  LABORATORY DATA:  I have reviewed the data as listed CBC Latest Ref Rng & Units 04/07/2019 03/24/2019 03/10/2019  WBC 4.0 - 10.5 K/uL 4.7 6.3 7.0  Hemoglobin 12.0 - 15.0 g/dL 9.3(L) 9.8(L) 9.5(L)  Hematocrit 36.0 - 46.0 % 30.0(L) 31.9(L) 30.3(L)  Platelets 150 - 400 K/uL 196 187 236     CMP Latest Ref Rng & Units 04/07/2019 03/24/2019 03/10/2019  Glucose 70 - 99 mg/dL 89 85 89  BUN 8 - 23 mg/dL 20 25(H) 21  Creatinine 0.44 - 1.00 mg/dL 5.24(HH) 5.27(HH) 5.47(HH)  Sodium 135 - 145 mmol/L 140 139 142  Potassium 3.5 - 5.1 mmol/L 4.3 3.8 3.6  Chloride 98 - 111 mmol/L 100 98 103  CO2 22 - 32 mmol/L 30 27 27   Calcium 8.9 - 10.3 mg/dL 7.4(L) 8.1(L) 7.8(L)  Total Protein 6.5 - 8.1 g/dL 6.8 7.0 6.9  Total Bilirubin 0.3 - 1.2 mg/dL 0.4 0.5 0.5  Alkaline Phos 38 - 126 U/L 109 121 134(H)  AST 15 - 41 U/L 13(L) 14(L) 14(L)  ALT 0 - 44 U/L 6 <6 <6      RADIOGRAPHIC STUDIES: I have personally reviewed the radiological images as listed and agreed with the findings in the report. No results found.   ASSESSMENT & PLAN:  Mary Chapman is a 73 y.o. female with   1. Adenocarcinomainliver, likelyintrahepatic cholangiocarcinoma, cT2N1M0 stage IIIB, unresectable, MS-Stable -Diagnosed in 02/2018. She is not eligible for surgery due to tumor size and location.She was seen by Dr. Cloyd Stagers at [facility].  -She was initially treated with first-line Cisplatin/Gemcitabine q2weekssince 12/2019but unfortunately had disease progression as seen on 02/2019 CT CAP.  -To better control her disease I started her on second line FOLFOX with dose reduction every 2  weeks on 02/24/19.  -S/p cycle 3 she is tolerating treatment moderate well with controlled neuropathy, controlled pain, fatigue and nausea. She is concerned about her tast change and insomnia. She is still able to eat adequately and weight stable.  -Labs reviewed and adequate to proceed with C4 FOLFOX today.  -F/u in 2 weeks   -will repeat CT scan in early Jan   2. Right abdominal pain  -Secondary to #1  -Pain has improved. She is currently taking Oxycodone 10mg  as needed, not everyday.   3. HTN  -Currently on metoprolol and amlodipine  -Her BP remains consistently high. I will speak with Dialysis Center about altering her medications to better control her HTN.   3. End Stage Renal Disease, on HD MWF -Treated with dialysis, tolerating well. Continue to f/u with nephrologist Dr. Augustin Coupe -Given chemo pump D/c is on every other  Saturday I previously recommended she change her Dialysis time to later on Saturdays, she is agreeable.    4. Anemia of chronic diseaseand chemo induced anemia, secondary to CKD and Malignancy  -She has receivedblood transfusion intermittently   -Recently restarted her onEPO at her dialysis center due to her worsening anemia  -stable overall.   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 -Controlled on Gabapentin (no more than 300mg  a day), overall stable   6.Goal of care discussion  -The patient understands the goal of care is palliative, to prolong his life -she is full code now     Plan -Labs reviewed and adequate to proceed with C4 FOLFOX today  -Lab, flush, f/u and FOLFOX in 2, 4, 6 weeks     No problem-specific Assessment & Plan notes found for this encounter.   No orders of the defined types were placed in this encounter.  All questions were answered. The patient knows to call the clinic with any problems, questions or concerns. No barriers to learning was detected. I spent 20  minutes counseling the patient face to face. The total time spent in the appointment was 25 minutes and more than 50% was on counseling and review of test results     Truitt Merle, MD 04/07/2019   I, Joslyn Devon, am acting as scribe for Truitt Merle, MD.   I have reviewed the above documentation for accuracy and completeness, and I agree with the above.

## 2019-04-07 ENCOUNTER — Encounter: Payer: Self-pay | Admitting: Hematology

## 2019-04-07 ENCOUNTER — Inpatient Hospital Stay: Payer: Medicare Other

## 2019-04-07 ENCOUNTER — Telehealth: Payer: Self-pay

## 2019-04-07 ENCOUNTER — Other Ambulatory Visit: Payer: Self-pay

## 2019-04-07 ENCOUNTER — Inpatient Hospital Stay: Payer: Medicare Other | Admitting: Hematology

## 2019-04-07 VITALS — BP 179/96 | HR 72 | Temp 98.0°F | Resp 17 | Ht 66.0 in | Wt 143.6 lb

## 2019-04-07 DIAGNOSIS — Z992 Dependence on renal dialysis: Secondary | ICD-10-CM | POA: Diagnosis not present

## 2019-04-07 DIAGNOSIS — N186 End stage renal disease: Secondary | ICD-10-CM

## 2019-04-07 DIAGNOSIS — I10 Essential (primary) hypertension: Secondary | ICD-10-CM | POA: Diagnosis not present

## 2019-04-07 DIAGNOSIS — C221 Intrahepatic bile duct carcinoma: Secondary | ICD-10-CM

## 2019-04-07 DIAGNOSIS — Z5111 Encounter for antineoplastic chemotherapy: Secondary | ICD-10-CM | POA: Diagnosis not present

## 2019-04-07 LAB — CBC WITH DIFFERENTIAL (CANCER CENTER ONLY)
Abs Immature Granulocytes: 0.02 10*3/uL (ref 0.00–0.07)
Basophils Absolute: 0 10*3/uL (ref 0.0–0.1)
Basophils Relative: 0 %
Eosinophils Absolute: 0.3 10*3/uL (ref 0.0–0.5)
Eosinophils Relative: 6 %
HCT: 30 % — ABNORMAL LOW (ref 36.0–46.0)
Hemoglobin: 9.3 g/dL — ABNORMAL LOW (ref 12.0–15.0)
Immature Granulocytes: 0 %
Lymphocytes Relative: 17 %
Lymphs Abs: 0.8 10*3/uL (ref 0.7–4.0)
MCH: 29.9 pg (ref 26.0–34.0)
MCHC: 31 g/dL (ref 30.0–36.0)
MCV: 96.5 fL (ref 80.0–100.0)
Monocytes Absolute: 0.7 10*3/uL (ref 0.1–1.0)
Monocytes Relative: 15 %
Neutro Abs: 2.9 10*3/uL (ref 1.7–7.7)
Neutrophils Relative %: 62 %
Platelet Count: 196 10*3/uL (ref 150–400)
RBC: 3.11 MIL/uL — ABNORMAL LOW (ref 3.87–5.11)
RDW: 18.2 % — ABNORMAL HIGH (ref 11.5–15.5)
WBC Count: 4.7 10*3/uL (ref 4.0–10.5)
nRBC: 0 % (ref 0.0–0.2)

## 2019-04-07 LAB — CMP (CANCER CENTER ONLY)
ALT: 6 U/L (ref 0–44)
AST: 13 U/L — ABNORMAL LOW (ref 15–41)
Albumin: 3 g/dL — ABNORMAL LOW (ref 3.5–5.0)
Alkaline Phosphatase: 109 U/L (ref 38–126)
Anion gap: 10 (ref 5–15)
BUN: 20 mg/dL (ref 8–23)
CO2: 30 mmol/L (ref 22–32)
Calcium: 7.4 mg/dL — ABNORMAL LOW (ref 8.9–10.3)
Chloride: 100 mmol/L (ref 98–111)
Creatinine: 5.24 mg/dL (ref 0.44–1.00)
GFR, Est AFR Am: 9 mL/min — ABNORMAL LOW (ref >60)
GFR, Estimated: 8 mL/min — ABNORMAL LOW (ref >60)
Glucose, Bld: 89 mg/dL (ref 70–99)
Potassium: 4.3 mmol/L (ref 3.5–5.1)
Sodium: 140 mmol/L (ref 135–145)
Total Bilirubin: 0.4 mg/dL (ref 0.3–1.2)
Total Protein: 6.8 g/dL (ref 6.5–8.1)

## 2019-04-07 MED ORDER — DEXAMETHASONE SODIUM PHOSPHATE 10 MG/ML IJ SOLN
10.0000 mg | Freq: Once | INTRAMUSCULAR | Status: AC
  Administered 2019-04-07: 10 mg via INTRAVENOUS

## 2019-04-07 MED ORDER — PALONOSETRON HCL INJECTION 0.25 MG/5ML
0.2500 mg | Freq: Once | INTRAVENOUS | Status: AC
  Administered 2019-04-07: 0.25 mg via INTRAVENOUS

## 2019-04-07 MED ORDER — LEUCOVORIN CALCIUM INJECTION 350 MG
400.0000 mg/m2 | Freq: Once | INTRAVENOUS | Status: AC
  Administered 2019-04-07: 696 mg via INTRAVENOUS
  Filled 2019-04-07: qty 34.8

## 2019-04-07 MED ORDER — OXALIPLATIN CHEMO INJECTION 100 MG/20ML
50.0000 mg/m2 | Freq: Once | INTRAVENOUS | Status: AC
  Administered 2019-04-07: 85 mg via INTRAVENOUS
  Filled 2019-04-07: qty 17

## 2019-04-07 MED ORDER — SODIUM CHLORIDE 0.9% FLUSH
10.0000 mL | INTRAVENOUS | Status: DC | PRN
  Administered 2019-04-07: 10 mL
  Filled 2019-04-07: qty 10

## 2019-04-07 MED ORDER — PALONOSETRON HCL INJECTION 0.25 MG/5ML
INTRAVENOUS | Status: AC
  Filled 2019-04-07: qty 5

## 2019-04-07 MED ORDER — DEXAMETHASONE SODIUM PHOSPHATE 10 MG/ML IJ SOLN
INTRAMUSCULAR | Status: AC
  Filled 2019-04-07: qty 1

## 2019-04-07 MED ORDER — SODIUM CHLORIDE 0.9 % IV SOLN
2000.0000 mg/m2 | INTRAVENOUS | Status: DC
  Administered 2019-04-07: 3500 mg via INTRAVENOUS
  Filled 2019-04-07: qty 70

## 2019-04-07 MED ORDER — DEXTROSE 5 % IV SOLN
Freq: Once | INTRAVENOUS | Status: AC
  Administered 2019-04-07: 11:00:00 via INTRAVENOUS
  Filled 2019-04-07: qty 250

## 2019-04-07 NOTE — Progress Notes (Signed)
Per Dr. Burr Medico, ok to treat with Scr 5.24. Patient receives dialysis 3 days/week.

## 2019-04-07 NOTE — Patient Instructions (Signed)
Cancer Center Discharge Instructions for Patients Receiving Chemotherapy  Today you received the following chemotherapy agents :  Oxaliplatin,  Leucovorin,  Fluorouracil.  To help prevent nausea and vomiting after your treatment, we encourage you to take your nausea medication as prescribed.   If you develop nausea and vomiting that is not controlled by your nausea medication, call the clinic.   BELOW ARE SYMPTOMS THAT SHOULD BE REPORTED IMMEDIATELY:  *FEVER GREATER THAN 100.5 F  *CHILLS WITH OR WITHOUT FEVER  NAUSEA AND VOMITING THAT IS NOT CONTROLLED WITH YOUR NAUSEA MEDICATION  *UNUSUAL SHORTNESS OF BREATH  *UNUSUAL BRUISING OR BLEEDING  TENDERNESS IN MOUTH AND THROAT WITH OR WITHOUT PRESENCE OF ULCERS  *URINARY PROBLEMS  *BOWEL PROBLEMS  UNUSUAL RASH Items with * indicate a potential emergency and should be followed up as soon as possible.  Feel free to call the clinic should you have any questions or concerns. The clinic phone number is [phone].  Please show the CHEMO ALERT CARD at check-in to the Emergency Department and triage nurse.   

## 2019-04-07 NOTE — Telephone Encounter (Signed)
Reached out to Dr. Augustin Coupe via secure chat regarding patients consistently elevated blood pressures.  He will be seeing the patient either Sunday or Tuesday and will see if adjustments can be made.

## 2019-04-08 ENCOUNTER — Telehealth: Payer: Self-pay | Admitting: Hematology

## 2019-04-08 LAB — CANCER ANTIGEN 19-9: CA 19-9: 44 U/mL — ABNORMAL HIGH (ref 0–35)

## 2019-04-08 NOTE — Telephone Encounter (Signed)
No los per 11/19 °

## 2019-04-09 ENCOUNTER — Inpatient Hospital Stay: Payer: Medicare Other

## 2019-04-09 VITALS — BP 182/88 | HR 73 | Temp 98.5°F | Resp 17

## 2019-04-09 DIAGNOSIS — Z5111 Encounter for antineoplastic chemotherapy: Secondary | ICD-10-CM | POA: Diagnosis not present

## 2019-04-09 DIAGNOSIS — C221 Intrahepatic bile duct carcinoma: Secondary | ICD-10-CM

## 2019-04-09 MED ORDER — CLONIDINE HCL 0.1 MG PO TABS
ORAL_TABLET | ORAL | Status: AC
  Filled 2019-04-09: qty 1

## 2019-04-09 MED ORDER — CLONIDINE HCL 0.1 MG PO TABS
0.1000 mg | ORAL_TABLET | Freq: Once | ORAL | Status: AC
  Administered 2019-04-09: 0.1 mg via ORAL

## 2019-04-09 MED ORDER — SODIUM CHLORIDE 0.9% FLUSH
10.0000 mL | INTRAVENOUS | Status: DC | PRN
  Administered 2019-04-09: 10 mL
  Filled 2019-04-09: qty 10

## 2019-04-09 MED ORDER — HEPARIN SOD (PORK) LOCK FLUSH 100 UNIT/ML IV SOLN
500.0000 [IU] | Freq: Once | INTRAVENOUS | Status: AC | PRN
  Administered 2019-04-09: 500 [IU]
  Filled 2019-04-09: qty 5

## 2019-04-09 NOTE — Progress Notes (Signed)
Contacted on-call MD Dr. Burr Medico and communicated patient nausea and using home medications and BP (see VS flowsheet) received verbal order to admin clonidine 0.1mg  and recheck BP in 30 min if systolic =/< 496 than can DC home.  BP 759 systolic and she stated that she is going straight to dialysis today. Patient stated that her headache is only mild now and she is feeling much better. Patient ambulated to daughter for pick up and then to dialysis center.

## 2019-04-16 ENCOUNTER — Other Ambulatory Visit: Payer: Self-pay | Admitting: Nurse Practitioner

## 2019-04-16 DIAGNOSIS — C221 Intrahepatic bile duct carcinoma: Secondary | ICD-10-CM

## 2019-04-19 NOTE — Telephone Encounter (Signed)
Refill request

## 2019-04-20 NOTE — Progress Notes (Signed)
[place]   [phone] [phone]   Clinic Follow up Note   Patient Care Team: Glendon Axe, MD as PCP - General (Family Medicine) [name], MD as PCP - Cardiology (Cardiology) Dwana Melena, MD as Attending Physician (Nephrology) Truitt Merle, MD as Consulting Physician (Hematology)  Date of Service:  04/21/2019  CHIEF COMPLAINT: f/u intrahepatic cholangiocarcinoma  SUMMARY OF ONCOLOGIC HISTORY: Oncology History  Intrahepatic cholangiocarcinoma ([place])  02/23/2018 Imaging   CT AP W Contrast 02/23/18  IMPRESSION: 1. Large multi-cystic lesion in the central aspect of the liver adjacent to the gallbladder fossa, with several smaller satellite lesions. These are all new compared to prior CT the chest, abdomen and pelvis 08/07/2016, concerning for intrahepatic abscesses. 2. Extensive mural thickening and inflammatory changes in the region of the cecum. This is nonspecific, and could reflect either right-sided diverticulitis, focal area of colitis, or potentially even underlying neoplasm. 3. Cholelithiasis. Gallbladder is nearly completely contracted without surrounding inflammatory changes to suggest an acute cholecystitis at this time. 4. Left-sided nephrolithiasis measuring up to 8 mm in the upper pole collecting system of left kidney. No ureteral stones or findings of urinary tract obstruction. 5. Aortic atherosclerosis.   02/27/2018 Initial Biopsy   Diagnosis 02/27/18  Liver, needle/core biopsy, Right Hepatic Lobe - ADENOCARCINOMA. SEE NOTE.   02/27/2018 Miscellaneous     03/05/2018 Procedure   Colonoscopy 03/05/18  IMPRESSION - Preparation of the colon was fair. - Non-thrombosed external hemorrhoids found on digital rectal exam. - There was significant looping of the colon. - Severe diverticulosis in the recto-sigmoid colon, in the sigmoid colon, in the descending colon, in the transverse colon, at the hepatic flexure, in the  ascending colon and in the cecum. There was no evidence of diverticular bleeding. - A single (solitary) ulcer in the cecum - highly concerning for underlying malignancy. Biopsied. Phlegmonous change is possible, though often in setting of complicated diverticulosis/diverticulitis ulceration would not be as common. - Erythematous mucosa in the transverse colon, at the hepatic flexure, in the ascending colon and in the cecum. Biopsied. - Normal mucosa in the rectum, in the recto-sigmoid colon, in the sigmoid colon and in the descending colon. Biopsied. - Non-bleeding non-thrombosed external and internal hemorrhoids. -----Negative for malignancy    03/11/2018 Initial Diagnosis   Intrahepatic cholangiocarcinoma ([place])   04/02/2018 Imaging   CT CAP W contrast 04/02/18  IMPRESSION: 1. 4 mm subpleural nodule in the periphery of the right lower lobe. This is nonspecific, but strongly favored to represent a benign subpleural lymph node. Attention on follow-up studies is recommended to exclude the possibility of metastatic disease. 2. Diffuse bronchial wall thickening with moderate centrilobular and mild paraseptal emphysema; imaging findings suggestive of underlying COPD. 3. Aortic atherosclerosis, in addition to left main and left anterior descending coronary artery disease. Assessment for potential risk factor modification, dietary therapy or pharmacologic therapy may be warranted, if clinically indicated. 4. Nonobstructive calculi in the upper pole collecting system of left kidney measuring up to 7 mm.    04/27/2018 Cancer Staging   Staging form: Intrahepatic Bile Duct, AJCC 8th Edition - Clinical: Stage IIIB (cT2, cN1, cM0) - Signed by Truitt Merle, MD on 04/27/2018   05/18/2018 - 02/10/2019 Chemotherapy   Gemcitabine and Cisplatin every 2 weeks starting on 05/18/2018, with 50% dose reduction. D/c after 02/10/19 due to disease progression.     07/19/2018 Imaging   CT CAP W CONTRAST   IMPRESSION: 1. Dominant inferior liver mass is decreased in  size. Two subcentimeter clustered low-attenuation lesions in the far inferior right liver lobe, not discretely visualized on the prior noncontrast CT study, can not exclude new small satellite hepatic metastases. 2. Porta hepatis adenopathy is mildly decreased. 3. Small right pulmonary nodules are stable. 4. No additional potential new or progressive metastatic disease. 5. Small pericardial effusion, slightly increased. 6. Cholelithiasis. 7. Moderate diffuse colonic diverticulosis. 8. Aortic Atherosclerosis (ICD10-I70.0) and Emphysema (ICD10-J43.9).    11/02/2018 Imaging   CT CAP WO contrast  IMPRESSION: 1. The dominant right hepatic lobe mass and most of the smaller right hepatic lobe lesions are stable. One of the right hepatic lobe lesions appears to have increased in size, formerly about 1.1 by 1.2 cm and currently 1.9 by 1.5 cm. 2. Two small right lower lobe pulmonary nodules are stable in size. 3. A hypodense right thyroid nodule has enlarged, currently 1.9 by 1.2 cm and formerly 1.1 by 0.7 cm. Consider thyroid ultrasound for further evaluation. 4. Other imaging findings of potential clinical significance: Aortic Atherosclerosis (ICD10-I70.0) and Emphysema (ICD10-J43.9). Coronary atherosclerosis. Mild cardiomegaly. Low-density blood pool suggests anemia. Nonobstructive left nephrolithiasis. Colonic diverticulosis.   02/22/2019 Imaging   CT CAP WO Contrast  IMPRESSION: 1. Interval increase in size of 2 now dominant hepatic lesions consistent with disease progression. Assessment of liver parenchyma limited by noncontrast study. 2. Stable right lower lobe pulmonary nodule. 2nd pulmonary nodule seen previously not identified today. 3. Stable 20 mm right thyroid nodule. 4.  Emphysema. (ICD10-J43.9) 5.  Aortic Atherosclerois (ICD10-170.0)   02/24/2019 -  Chemotherapy   Second-line FOLFOX  with dose reduction starting  02/24/19       CURRENT THERAPY:  Second-line FOLFOX starting 02/24/19  INTERVAL HISTORY:  Mary Chapman is here for a follow up and treatment. She presents to the clinic alone. She notes she is doing well overall. She denies any new changes since last cycle. She has been taking Gabapentin 100mg  2-3 times a week at night which has been better managing her neuropathy. She denies, fever or cough. She notes having RUQ pain 6/10 which is intermittent. She feels her hepatomegaly has increased.     REVIEW OF SYSTEMS:   Constitutional: Denies fevers, chills or abnormal weight loss Eyes: Denies blurriness of vision Ears, nose, mouth, throat, and face: Denies mucositis or sore throat Respiratory: Denies cough, dyspnea or wheezes Cardiovascular: Denies palpitation, chest discomfort or lower extremity swelling Gastrointestinal:  Denies nausea, heartburn or change in bowel habits (+) intermittent RUQ pain Skin: Denies abnormal skin rashes Lymphatics: Denies new lymphadenopathy or easy bruising Neurological:Denies numbness, tingling or new weaknesses Behavioral/Psych: Mood is stable, no new changes  All other systems were reviewed with the patient and are negative.  MEDICAL HISTORY:  Past Medical History:  Diagnosis Date  . Diverticulitis   . Focal segmental glomerulosclerosis    ESRD on MWF HD  . Hypertension   . intrahepatic cholangio ca dx'd10/2019  . Renal insufficiency    dialysis pt MWF  . Tobacco abuse     SURGICAL HISTORY: Past Surgical History:  Procedure Laterality Date  . AV FISTULA PLACEMENT Left 03/18/2017   Procedure: Brachiocephalic ARTERIOVENOUS (AV) FISTULA CREATION left arm;  Surgeon: Elam Dutch, MD;  Location: [place];  Service: Vascular;  Laterality: Left;  . BIOPSY  03/05/2018   Procedure: BIOPSY;  Surgeon: Rush Landmark Telford Nab., MD;  Location: [place];  Service: Gastroenterology;;  enteroscopy bx /cytology brushing Greig Castilla bx  . COLONOSCOPY WITH PROPOFOL  N/A 03/05/2018   Procedure: COLONOSCOPY  WITH PROPOFOL;  Surgeon: Mansouraty, Telford Nab., MD;  Location: [place];  Service: Gastroenterology;  Laterality: N/A;  Bx, Spot  . ENTEROSCOPY N/A 03/05/2018   Procedure: ENTEROSCOPY;  Surgeon: Rush Landmark Telford Nab., MD;  Location: [place];  Service: Gastroenterology;  Laterality: N/A;  BX, Brushing, & Spot  . INSERTION OF DIALYSIS CATHETER N/A 03/18/2017   Procedure: INSERTION OF DIALYSIS CATHETER;  Surgeon: Elam Dutch, MD;  Location: [place];  Service: Vascular;  Laterality: N/A;  . IR GENERIC HISTORICAL  08/15/2016   IR US GUIDE VASC ACCESS RIGHT 08/15/2016 Arne Cleveland, MD [facility]  . IR GENERIC HISTORICAL  08/15/2016   IR FLUORO GUIDE CV LINE RIGHT 08/15/2016 Arne Cleveland, MD [facility]  . IR IMAGING GUIDED PORT INSERTION  05/06/2018  . SUBMUCOSAL INJECTION  03/05/2018   Procedure: SUBMUCOSAL INJECTION;  Surgeon: Rush Landmark Telford Nab., MD;  Location: [place];  Service: Gastroenterology;;  spot tatoo  . TUBAL LIGATION      I have reviewed the social history and family history with the patient and they are unchanged from previous note.  ALLERGIES:  has No Known Allergies.  MEDICATIONS:  Current Outpatient Medications  Medication Sig Dispense Refill  . amLODipine (NORVASC) 10 MG tablet Take by mouth at bedtime.     . folic acid (FOLVITE) 1 MG tablet Take 1 tablet (1 mg total) by mouth daily. 30 tablet 0  . gabapentin (NEURONTIN) 100 MG capsule TAKE 1 CAPSULE (100 MG TOTAL) BY MOUTH AT BEDTIME. 90 capsule 2  . metoprolol tartrate (LOPRESSOR) 25 MG tablet Take 0.5 tablets (12.5 mg total) by mouth 2 (two) times daily. 30 tablet 0  . multivitamin (RENA-VIT) TABS tablet Take 1 tablet by mouth at bedtime. 30 tablet 3  . Nutritional Supplements (FEEDING SUPPLEMENT, NEPRO CARB STEADY,) LIQD Take 237 mLs by mouth 3 (three) times daily as needed (Supplement). 90 Can 0  . oxyCODONE (OXY IR/ROXICODONE) 5 MG immediate release  tablet Take 1 tablet (5 mg total) by mouth every 6 (six) hours as needed for severe pain. 30 tablet 0  . Oxycodone HCl 10 MG TABS Take 1 tablet (10 mg total) by mouth every 8 (eight) hours as needed. 60 tablet 0  . polyethylene glycol (MIRALAX) packet Take 17 g by mouth daily. 30 each 0  . senna-docusate (SENOKOT-S) 8.6-50 MG tablet Take 2 tablets by mouth 2 (two) times daily. 30 tablet 0  . Vitamin D, Ergocalciferol, (DRISDOL) 50000 units CAPS capsule Take 50,000 Units by mouth once a week.  5  . pantoprazole (PROTONIX) 40 MG tablet TAKE 1 TABLET BY MOUTH TWICE A DAY 60 tablet 1   No current facility-administered medications for this visit.     PHYSICAL EXAMINATION: ECOG PERFORMANCE STATUS: 2 - Symptomatic, <50% confined to bed  Vitals:   04/21/19 1128  BP: (!) 178/97  Pulse: 78  Resp: 20  Temp: 98.2 F (36.8 C)  SpO2: 99%   Filed Weights   04/21/19 1128  Weight: 143 lb 9.6 oz (65.1 kg)    GENERAL:alert, no distress and comfortable SKIN: skin color, texture, turgor are normal, no rashes or significant lesions EYES: normal, Conjunctiva are pink and non-injected, sclera clear  NECK: supple, thyroid normal size, non-tender, without nodularity LYMPH:  no palpable lymphadenopathy in the cervical, axillary  LUNGS: clear to auscultation and percussion with normal breathing effort HEART: regular rate & rhythm and no murmurs and no lower extremity edema ABDOMEN:abdomen soft, non-tender and normal bowel sounds (+) Hepatomegaly, palpable 7  cm below ribcage in midline, tender.  Musculoskeletal:no cyanosis of digits and no clubbing  NEURO: alert & oriented x 3 with fluent speech, no focal motor/sensory deficits  LABORATORY DATA:  I have reviewed the data as listed CBC Latest Ref Rng & Units 04/21/2019 04/07/2019 03/24/2019  WBC 4.0 - 10.5 K/uL 5.2 4.7 6.3  Hemoglobin 12.0 - 15.0 g/dL 9.1(L) 9.3(L) 9.8(L)  Hematocrit 36.0 - 46.0 % 30.2(L) 30.0(L) 31.9(L)  Platelets 150 - 400 K/uL 179 196  187     CMP Latest Ref Rng & Units 04/21/2019 04/07/2019 03/24/2019  Glucose 70 - 99 mg/dL 88 89 85  BUN 8 - 23 mg/dL 11 20 25(H)  Creatinine 0.44 - 1.00 mg/dL 4.75(HH) 5.24(HH) 5.27(HH)  Sodium 135 - 145 mmol/L 140 140 139  Potassium 3.5 - 5.1 mmol/L 4.8 4.3 3.8  Chloride 98 - 111 mmol/L 103 100 98  CO2 22 - 32 mmol/L 30 30 27   Calcium 8.9 - 10.3 mg/dL 7.5(L) 7.4(L) 8.1(L)  Total Protein 6.5 - 8.1 g/dL 6.6 6.8 7.0  Total Bilirubin 0.3 - 1.2 mg/dL 0.4 0.4 0.5  Alkaline Phos 38 - 126 U/L 110 109 121  AST 15 - 41 U/L 15 13(L) 14(L)  ALT 0 - 44 U/L <6 6 <6      RADIOGRAPHIC STUDIES: I have personally reviewed the radiological images as listed and agreed with the findings in the report. No results found.   ASSESSMENT & PLAN:  Mary Chapman is a 73 y.o. female with   1. Adenocarcinomainliver, likelyintrahepatic cholangiocarcinoma, cT2N1M0 stage IIIB, unresectable, MS-Stable -Diagnosed in 02/2018. She is not eligible for surgery due to tumor size and location.She was seen by Dr. Cloyd Stagers at [facility].  -She was initiallytreated with first-lineCisplatin/Gemcitabineq2weekssince 12/2019but unfortunately had disease progression as seen on 02/2019 CT CAP.  -I started her on second line FOLFOX with dose reduction every 2 weeks on 02/24/19.  -S/p C4 she is stable with controlled neuropathy on Gabapentin. She notes intermittent RUQ pain is worsening. She has hepatomegaly palpable 7cm below ribcage on exam today, concerning for disease progression. Will obtain restaging CT scan soon after this cycle chemo for further evaluation. She is agreeable.  -Labs reviewed, stable and adequate to proceed with C5 FOLFOX today.  -F/u in 2 weeks with scan a few days before    2. Right abdominal pain  -Secondary to #1  -Pain intermitted but with recent increasing pain level and hepatomegaly. She is currently taking Oxycodone 10mg  once daily now.   3. HTN  -Currently on metoprolol and amlodipine  -Her BP  remains consistently high. I have spoken with her nephrologist about adjusting her medications to better control her HTN.   3. End Stage Renal Disease, on HD MWF -Treated with dialysis, tolerating well. Continue to f/u with nephrologist Dr. Augustin Coupe -Given chemo pump D/c is on every other Saturday I previously recommended she change her Dialysis timeto later on Saturdays, she is agreeable.  4. Anemia of chronic diseaseand chemo induced anemia, secondary to CKD and Malignancy -She has receivedblood transfusion intermittently  -Recentlyrestartedher onEPO at her dialysis center due to her worsening anemia  -stable overall.   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 -Controlled on Gabapentin 100mg  at night 2-3 times a week.    6.Goal of care discussion  -The patient understands the goal of care is palliative, to prolong his life -she is full code now   Plan -I refilled Protonix today  -Labs reviewed and  adequate to proceed with C5 FOLFOX today  -CT CAP wo contrast in 1-2 weeks  -f/u in 2 weeks    No problem-specific Assessment & Plan notes found for this encounter.   Orders Placed This Encounter  Procedures  . CT Abdomen Pelvis Wo Contrast    Standing Status:   Future    Standing Expiration Date:   04/20/2020    Order Specific Question:   Preferred imaging location?    Answer:   [facility]    Order Specific Question:   Is Oral Contrast requested for this exam?    Answer:   Yes, Per Radiology protocol    Order Specific Question:   Radiology Contrast Protocol - do NOT remove file path    Answer:   \\[email or web address]  . CT Chest Wo Contrast    Standing Status:   Future    Standing Expiration Date:   04/20/2020    Order Specific Question:   Preferred imaging location?    Answer:   [facility]    Order Specific Question:   Radiology Contrast Protocol - do NOT remove  file path    Answer:   \\[email or web address]   All questions were answered. The patient knows to call the clinic with any problems, questions or concerns. No barriers to learning was detected. I spent 20 minutes counseling the patient face to face. The total time spent in the appointment was 25 minutes and more than 50% was on counseling and review of test results     Truitt Merle, MD 04/21/2019   I, Joslyn Devon, am acting as scribe for Truitt Merle, MD.   I have reviewed the above documentation for accuracy and completeness, and I agree with the above.

## 2019-04-21 ENCOUNTER — Inpatient Hospital Stay (HOSPITAL_BASED_OUTPATIENT_CLINIC_OR_DEPARTMENT_OTHER): Payer: Medicare Other | Admitting: Hematology

## 2019-04-21 ENCOUNTER — Inpatient Hospital Stay: Payer: Medicare Other

## 2019-04-21 ENCOUNTER — Telehealth: Payer: Self-pay | Admitting: Hematology

## 2019-04-21 ENCOUNTER — Other Ambulatory Visit: Payer: Self-pay

## 2019-04-21 ENCOUNTER — Encounter: Payer: Self-pay | Admitting: Hematology

## 2019-04-21 ENCOUNTER — Telehealth: Payer: Self-pay | Admitting: *Deleted

## 2019-04-21 ENCOUNTER — Inpatient Hospital Stay: Payer: Medicare Other | Attending: Hematology

## 2019-04-21 VITALS — BP 178/97 | HR 78 | Temp 98.2°F | Resp 20 | Ht 66.0 in | Wt 143.6 lb

## 2019-04-21 DIAGNOSIS — C221 Intrahepatic bile duct carcinoma: Secondary | ICD-10-CM

## 2019-04-21 DIAGNOSIS — N186 End stage renal disease: Secondary | ICD-10-CM | POA: Diagnosis not present

## 2019-04-21 DIAGNOSIS — J690 Pneumonitis due to inhalation of food and vomit: Secondary | ICD-10-CM | POA: Diagnosis not present

## 2019-04-21 DIAGNOSIS — Z95828 Presence of other vascular implants and grafts: Secondary | ICD-10-CM

## 2019-04-21 DIAGNOSIS — D631 Anemia in chronic kidney disease: Secondary | ICD-10-CM | POA: Insufficient documentation

## 2019-04-21 DIAGNOSIS — Z992 Dependence on renal dialysis: Secondary | ICD-10-CM

## 2019-04-21 DIAGNOSIS — E875 Hyperkalemia: Secondary | ICD-10-CM | POA: Diagnosis not present

## 2019-04-21 DIAGNOSIS — Z452 Encounter for adjustment and management of vascular access device: Secondary | ICD-10-CM | POA: Insufficient documentation

## 2019-04-21 DIAGNOSIS — I10 Essential (primary) hypertension: Secondary | ICD-10-CM

## 2019-04-21 DIAGNOSIS — G620 Drug-induced polyneuropathy: Secondary | ICD-10-CM | POA: Insufficient documentation

## 2019-04-21 LAB — CBC WITH DIFFERENTIAL (CANCER CENTER ONLY)
Abs Immature Granulocytes: 0.02 10*3/uL (ref 0.00–0.07)
Basophils Absolute: 0 10*3/uL (ref 0.0–0.1)
Basophils Relative: 0 %
Eosinophils Absolute: 0.4 10*3/uL (ref 0.0–0.5)
Eosinophils Relative: 7 %
HCT: 30.2 % — ABNORMAL LOW (ref 36.0–46.0)
Hemoglobin: 9.1 g/dL — ABNORMAL LOW (ref 12.0–15.0)
Immature Granulocytes: 0 %
Lymphocytes Relative: 15 %
Lymphs Abs: 0.8 10*3/uL (ref 0.7–4.0)
MCH: 29.3 pg (ref 26.0–34.0)
MCHC: 30.1 g/dL (ref 30.0–36.0)
MCV: 97.1 fL (ref 80.0–100.0)
Monocytes Absolute: 0.8 10*3/uL (ref 0.1–1.0)
Monocytes Relative: 15 %
Neutro Abs: 3.3 10*3/uL (ref 1.7–7.7)
Neutrophils Relative %: 63 %
Platelet Count: 179 10*3/uL (ref 150–400)
RBC: 3.11 MIL/uL — ABNORMAL LOW (ref 3.87–5.11)
RDW: 18.7 % — ABNORMAL HIGH (ref 11.5–15.5)
WBC Count: 5.2 10*3/uL (ref 4.0–10.5)
nRBC: 0 % (ref 0.0–0.2)

## 2019-04-21 LAB — CMP (CANCER CENTER ONLY)
ALT: <6 U/L (ref 0–44)
AST: 15 U/L (ref 15–41)
Albumin: 2.9 g/dL — ABNORMAL LOW (ref 3.5–5.0)
Alkaline Phosphatase: 110 U/L (ref 38–126)
Anion gap: 7 (ref 5–15)
BUN: 11 mg/dL (ref 8–23)
CO2: 30 mmol/L (ref 22–32)
Calcium: 7.5 mg/dL — ABNORMAL LOW (ref 8.9–10.3)
Chloride: 103 mmol/L (ref 98–111)
Creatinine: 4.75 mg/dL (ref 0.44–1.00)
GFR, Est AFR Am: 10 mL/min — ABNORMAL LOW (ref >60)
GFR, Estimated: 8 mL/min — ABNORMAL LOW (ref >60)
Glucose, Bld: 88 mg/dL (ref 70–99)
Potassium: 4.8 mmol/L (ref 3.5–5.1)
Sodium: 140 mmol/L (ref 135–145)
Total Bilirubin: 0.4 mg/dL (ref 0.3–1.2)
Total Protein: 6.6 g/dL (ref 6.5–8.1)

## 2019-04-21 MED ORDER — DEXAMETHASONE SODIUM PHOSPHATE 10 MG/ML IJ SOLN
INTRAMUSCULAR | Status: AC
  Filled 2019-04-21: qty 1

## 2019-04-21 MED ORDER — DEXAMETHASONE SODIUM PHOSPHATE 10 MG/ML IJ SOLN
10.0000 mg | Freq: Once | INTRAMUSCULAR | Status: AC
  Administered 2019-04-21: 10 mg via INTRAVENOUS

## 2019-04-21 MED ORDER — SODIUM CHLORIDE 0.9% FLUSH
10.0000 mL | INTRAVENOUS | Status: DC | PRN
  Filled 2019-04-21: qty 10

## 2019-04-21 MED ORDER — HEPARIN SOD (PORK) LOCK FLUSH 100 UNIT/ML IV SOLN
500.0000 [IU] | Freq: Once | INTRAVENOUS | Status: DC | PRN
  Filled 2019-04-21: qty 5

## 2019-04-21 MED ORDER — PALONOSETRON HCL INJECTION 0.25 MG/5ML
INTRAVENOUS | Status: AC
  Filled 2019-04-21: qty 5

## 2019-04-21 MED ORDER — SODIUM CHLORIDE 0.9 % IV SOLN
2000.0000 mg/m2 | INTRAVENOUS | Status: DC
  Administered 2019-04-21: 3500 mg via INTRAVENOUS
  Filled 2019-04-21: qty 70

## 2019-04-21 MED ORDER — SODIUM CHLORIDE 0.9% FLUSH
10.0000 mL | INTRAVENOUS | Status: DC | PRN
  Administered 2019-04-21: 10 mL
  Filled 2019-04-21: qty 10

## 2019-04-21 MED ORDER — LEUCOVORIN CALCIUM INJECTION 350 MG
400.0000 mg/m2 | Freq: Once | INTRAVENOUS | Status: AC
  Administered 2019-04-21: 696 mg via INTRAVENOUS
  Filled 2019-04-21: qty 34.8

## 2019-04-21 MED ORDER — OXALIPLATIN CHEMO INJECTION 100 MG/20ML
50.0000 mg/m2 | Freq: Once | INTRAVENOUS | Status: AC
  Administered 2019-04-21: 85 mg via INTRAVENOUS
  Filled 2019-04-21: qty 17

## 2019-04-21 MED ORDER — DEXTROSE 5 % IV SOLN
Freq: Once | INTRAVENOUS | Status: AC
  Administered 2019-04-21: 12:00:00 via INTRAVENOUS
  Filled 2019-04-21: qty 250

## 2019-04-21 MED ORDER — PALONOSETRON HCL INJECTION 0.25 MG/5ML
0.2500 mg | Freq: Once | INTRAVENOUS | Status: AC
  Administered 2019-04-21: 0.25 mg via INTRAVENOUS

## 2019-04-21 NOTE — Telephone Encounter (Signed)
Per 12/3 los CT CAP wo contrast around 12/14

## 2019-04-21 NOTE — Telephone Encounter (Signed)
Received call from Lelan Pons in lab reporting that panic creatinine is 47.  Informed Dr. Burr Medico.  She saw pt today & pt is dialysis pt.

## 2019-04-21 NOTE — Patient Instructions (Signed)
[facility] Discharge Instructions for Patients Receiving Chemotherapy  Today you received the following chemotherapy agents :  Oxaliplatin,  Leucovorin,  Fluorouracil.  To help prevent nausea and vomiting after your treatment, we encourage you to take your nausea medication as prescribed.   If you develop nausea and vomiting that is not controlled by your nausea medication, call the clinic.   BELOW ARE SYMPTOMS THAT SHOULD BE REPORTED IMMEDIATELY:  *FEVER GREATER THAN 100.5 F  *CHILLS WITH OR WITHOUT FEVER  NAUSEA AND VOMITING THAT IS NOT CONTROLLED WITH YOUR NAUSEA MEDICATION  *UNUSUAL SHORTNESS OF BREATH  *UNUSUAL BRUISING OR BLEEDING  TENDERNESS IN MOUTH AND THROAT WITH OR WITHOUT PRESENCE OF ULCERS  *URINARY PROBLEMS  *BOWEL PROBLEMS  UNUSUAL RASH Items with * indicate a potential emergency and should be followed up as soon as possible.  Feel free to call the clinic should you have any questions or concerns. The clinic phone number is [phone].  Please show the CHEMO ALERT CARD at check-in to the Emergency Department and triage nurse.   

## 2019-04-22 ENCOUNTER — Telehealth: Payer: Self-pay

## 2019-04-22 NOTE — Telephone Encounter (Signed)
Spoke with patient regarding need to drink oral prep, explained she needed to pick it up either at [facility] or [facility], drink one bottle at 1:30 and the second bottle at 2:30.  She verbalized an understanding.

## 2019-04-23 ENCOUNTER — Encounter (HOSPITAL_COMMUNITY): Payer: Self-pay | Admitting: Emergency Medicine

## 2019-04-23 ENCOUNTER — Other Ambulatory Visit: Payer: Self-pay

## 2019-04-23 ENCOUNTER — Emergency Department (HOSPITAL_COMMUNITY): Payer: Medicare Other

## 2019-04-23 ENCOUNTER — Telehealth: Payer: Self-pay | Admitting: Hematology

## 2019-04-23 ENCOUNTER — Inpatient Hospital Stay (HOSPITAL_COMMUNITY)
Admission: EM | Admit: 2019-04-23 | Discharge: 2019-04-25 | DRG: 177 | Disposition: A | Discharge status: Home / Self Care | Payer: Medicare Other | Attending: Internal Medicine | Admitting: Internal Medicine

## 2019-04-23 ENCOUNTER — Inpatient Hospital Stay: Payer: Medicare Other

## 2019-04-23 VITALS — BP 183/98 | HR 84 | Temp 97.9°F | Resp 15

## 2019-04-23 DIAGNOSIS — J690 Pneumonitis due to inhalation of food and vomit: Secondary | ICD-10-CM | POA: Diagnosis not present

## 2019-04-23 DIAGNOSIS — C221 Intrahepatic bile duct carcinoma: Secondary | ICD-10-CM | POA: Diagnosis present

## 2019-04-23 DIAGNOSIS — E8889 Other specified metabolic disorders: Secondary | ICD-10-CM | POA: Diagnosis present

## 2019-04-23 DIAGNOSIS — J9601 Acute respiratory failure with hypoxia: Secondary | ICD-10-CM | POA: Diagnosis present

## 2019-04-23 DIAGNOSIS — Z79891 Long term (current) use of opiate analgesic: Secondary | ICD-10-CM

## 2019-04-23 DIAGNOSIS — Z833 Family history of diabetes mellitus: Secondary | ICD-10-CM

## 2019-04-23 DIAGNOSIS — Z9115 Patient's noncompliance with renal dialysis: Secondary | ICD-10-CM

## 2019-04-23 DIAGNOSIS — Z79899 Other long term (current) drug therapy: Secondary | ICD-10-CM

## 2019-04-23 DIAGNOSIS — Z20828 Contact with and (suspected) exposure to other viral communicable diseases: Secondary | ICD-10-CM | POA: Diagnosis present

## 2019-04-23 DIAGNOSIS — F17210 Nicotine dependence, cigarettes, uncomplicated: Secondary | ICD-10-CM | POA: Diagnosis present

## 2019-04-23 DIAGNOSIS — E875 Hyperkalemia: Secondary | ICD-10-CM

## 2019-04-23 DIAGNOSIS — Z8249 Family history of ischemic heart disease and other diseases of the circulatory system: Secondary | ICD-10-CM

## 2019-04-23 DIAGNOSIS — J189 Pneumonia, unspecified organism: Secondary | ICD-10-CM

## 2019-04-23 DIAGNOSIS — D631 Anemia in chronic kidney disease: Secondary | ICD-10-CM | POA: Diagnosis present

## 2019-04-23 DIAGNOSIS — J9600 Acute respiratory failure, unspecified whether with hypoxia or hypercapnia: Secondary | ICD-10-CM

## 2019-04-23 DIAGNOSIS — R778 Other specified abnormalities of plasma proteins: Secondary | ICD-10-CM

## 2019-04-23 DIAGNOSIS — N186 End stage renal disease: Secondary | ICD-10-CM | POA: Diagnosis present

## 2019-04-23 DIAGNOSIS — C799 Secondary malignant neoplasm of unspecified site: Secondary | ICD-10-CM | POA: Diagnosis present

## 2019-04-23 DIAGNOSIS — N2581 Secondary hyperparathyroidism of renal origin: Secondary | ICD-10-CM | POA: Diagnosis present

## 2019-04-23 DIAGNOSIS — Y95 Nosocomial condition: Secondary | ICD-10-CM | POA: Diagnosis present

## 2019-04-23 DIAGNOSIS — Z9221 Personal history of antineoplastic chemotherapy: Secondary | ICD-10-CM

## 2019-04-23 DIAGNOSIS — Z992 Dependence on renal dialysis: Secondary | ICD-10-CM

## 2019-04-23 DIAGNOSIS — Z66 Do not resuscitate: Secondary | ICD-10-CM | POA: Diagnosis present

## 2019-04-23 DIAGNOSIS — I132 Hypertensive heart and chronic kidney disease with heart failure and with stage 5 chronic kidney disease, or end stage renal disease: Secondary | ICD-10-CM | POA: Diagnosis present

## 2019-04-23 DIAGNOSIS — I5033 Acute on chronic diastolic (congestive) heart failure: Secondary | ICD-10-CM | POA: Diagnosis present

## 2019-04-23 LAB — CBC
HCT: 31.8 % — ABNORMAL LOW (ref 36.0–46.0)
Hemoglobin: 9.4 g/dL — ABNORMAL LOW (ref 12.0–15.0)
MCH: 29.5 pg (ref 26.0–34.0)
MCHC: 29.6 g/dL — ABNORMAL LOW (ref 30.0–36.0)
MCV: 99.7 fL (ref 80.0–100.0)
Platelets: 199 10*3/uL (ref 150–400)
RBC: 3.19 MIL/uL — ABNORMAL LOW (ref 3.87–5.11)
RDW: 19.1 % — ABNORMAL HIGH (ref 11.5–15.5)
WBC: 5.5 10*3/uL (ref 4.0–10.5)
nRBC: 0 % (ref 0.0–0.2)

## 2019-04-23 LAB — BASIC METABOLIC PANEL
Anion gap: 12 (ref 5–15)
BUN: 32 mg/dL — ABNORMAL HIGH (ref 8–23)
CO2: 24 mmol/L (ref 22–32)
Calcium: 7.6 mg/dL — ABNORMAL LOW (ref 8.9–10.3)
Chloride: 104 mmol/L (ref 98–111)
Creatinine, Ser: 8.19 mg/dL — ABNORMAL HIGH (ref 0.44–1.00)
GFR calc Af Amer: 5 mL/min — ABNORMAL LOW (ref >60)
GFR calc non Af Amer: 4 mL/min — ABNORMAL LOW (ref >60)
Glucose, Bld: 107 mg/dL — ABNORMAL HIGH (ref 70–99)
Potassium: 5.4 mmol/L — ABNORMAL HIGH (ref 3.5–5.1)
Sodium: 140 mmol/L (ref 135–145)

## 2019-04-23 LAB — CREATININE, SERUM
Creatinine, Ser: 8.51 mg/dL — ABNORMAL HIGH (ref 0.44–1.00)
GFR calc Af Amer: 5 mL/min — ABNORMAL LOW (ref >60)
GFR calc non Af Amer: 4 mL/min — ABNORMAL LOW (ref >60)

## 2019-04-23 LAB — POC SARS CORONAVIRUS 2 AG -  ED: SARS Coronavirus 2 Ag: NEGATIVE

## 2019-04-23 LAB — TROPONIN I (HIGH SENSITIVITY)
Troponin I (High Sensitivity): 25 ng/L — ABNORMAL HIGH (ref <18)
Troponin I (High Sensitivity): 26 ng/L — ABNORMAL HIGH (ref <18)

## 2019-04-23 LAB — HEPATITIS B SURFACE ANTIGEN: Hepatitis B Surface Ag: NONREACTIVE

## 2019-04-23 LAB — SARS CORONAVIRUS 2 (TAT 6-24 HRS): SARS Coronavirus 2: NEGATIVE

## 2019-04-23 MED ORDER — LIDOCAINE-PRILOCAINE 2.5-2.5 % EX CREA
1.0000 "application " | TOPICAL_CREAM | CUTANEOUS | Status: DC | PRN
  Filled 2019-04-23: qty 5

## 2019-04-23 MED ORDER — DOXERCALCIFEROL 4 MCG/2ML IV SOLN
6.0000 ug | INTRAVENOUS | Status: DC
  Administered 2019-04-23 – 2019-04-25 (×2): 6 ug via INTRAVENOUS
  Filled 2019-04-23 (×2): qty 4

## 2019-04-23 MED ORDER — RENA-VITE PO TABS
1.0000 | ORAL_TABLET | Freq: Every day | ORAL | Status: DC
  Administered 2019-04-24 (×2): 1 via ORAL
  Filled 2019-04-23 (×3): qty 1

## 2019-04-23 MED ORDER — PANTOPRAZOLE SODIUM 40 MG PO TBEC
40.0000 mg | DELAYED_RELEASE_TABLET | Freq: Two times a day (BID) | ORAL | Status: DC
  Administered 2019-04-24 – 2019-04-25 (×4): 40 mg via ORAL
  Filled 2019-04-23 (×4): qty 1

## 2019-04-23 MED ORDER — DARBEPOETIN ALFA 100 MCG/0.5ML IJ SOSY
100.0000 ug | PREFILLED_SYRINGE | Freq: Once | INTRAMUSCULAR | Status: AC
  Administered 2019-04-23: 23:00:00 100 ug via INTRAVENOUS
  Filled 2019-04-23: qty 0.5

## 2019-04-23 MED ORDER — DARBEPOETIN ALFA 100 MCG/0.5ML IJ SOSY
PREFILLED_SYRINGE | INTRAMUSCULAR | Status: AC
  Administered 2019-04-23: 100 ug via INTRAVENOUS
  Filled 2019-04-23: qty 0.5

## 2019-04-23 MED ORDER — FOLIC ACID 1 MG PO TABS
1.0000 mg | ORAL_TABLET | Freq: Every day | ORAL | Status: DC
  Administered 2019-04-24 – 2019-04-25 (×2): 1 mg via ORAL
  Filled 2019-04-23 (×2): qty 1

## 2019-04-23 MED ORDER — SODIUM CHLORIDE 0.9 % IV SOLN: 100.0000 mL | INTRAVENOUS | Status: DC | PRN

## 2019-04-23 MED ORDER — SODIUM CHLORIDE 0.9% FLUSH
10.0000 mL | INTRAVENOUS | Status: DC | PRN
  Administered 2019-04-23: 10 mL
  Filled 2019-04-23: qty 10

## 2019-04-23 MED ORDER — SODIUM CHLORIDE 0.9 % IV SOLN
1.0000 g | Freq: Once | INTRAVENOUS | Status: DC
  Filled 2019-04-23: qty 10

## 2019-04-23 MED ORDER — SODIUM CHLORIDE 0.9% FLUSH
10.0000 mL | INTRAVENOUS | Status: DC | PRN
  Administered 2019-04-25: 10 mL
  Filled 2019-04-23: qty 40

## 2019-04-23 MED ORDER — PROCHLORPERAZINE MALEATE 10 MG PO TABS
10.0000 mg | ORAL_TABLET | Freq: Four times a day (QID) | ORAL | Status: DC | PRN
  Administered 2019-04-23: 10 mg via ORAL

## 2019-04-23 MED ORDER — LIDOCAINE HCL (PF) 1 % IJ SOLN: 5.0000 mL | INTRAMUSCULAR | Status: DC | PRN

## 2019-04-23 MED ORDER — CHLORHEXIDINE GLUCONATE CLOTH 2 % EX PADS
6.0000 | MEDICATED_PAD | Freq: Every day | CUTANEOUS | Status: DC
  Administered 2019-04-24: 6 via TOPICAL

## 2019-04-23 MED ORDER — NEPRO/CARBSTEADY PO LIQD
237.0000 mL | Freq: Three times a day (TID) | ORAL | Status: DC | PRN
  Filled 2019-04-23: qty 237

## 2019-04-23 MED ORDER — POLYETHYLENE GLYCOL 3350 17 G PO PACK
17.0000 g | PACK | Freq: Every day | ORAL | Status: DC
  Administered 2019-04-25: 17 g via ORAL
  Filled 2019-04-23 (×2): qty 1

## 2019-04-23 MED ORDER — SODIUM CHLORIDE 0.9 % IV SOLN
500.0000 mg | INTRAVENOUS | Status: DC
  Administered 2019-04-24: 500 mg via INTRAVENOUS
  Filled 2019-04-23 (×2): qty 500

## 2019-04-23 MED ORDER — METOPROLOL TARTRATE 12.5 MG HALF TABLET
12.5000 mg | ORAL_TABLET | Freq: Two times a day (BID) | ORAL | Status: DC
  Administered 2019-04-24: 12.5 mg via ORAL
  Filled 2019-04-23: qty 1

## 2019-04-23 MED ORDER — SODIUM CHLORIDE 0.9% FLUSH: 3.0000 mL | Freq: Once | INTRAVENOUS | Status: DC

## 2019-04-23 MED ORDER — OXYCODONE-ACETAMINOPHEN 5-325 MG PO TABS
ORAL_TABLET | ORAL | Status: AC
  Filled 2019-04-23: qty 1

## 2019-04-23 MED ORDER — SODIUM CHLORIDE 0.9 % IV SOLN
1.5000 g | INTRAVENOUS | Status: DC
  Administered 2019-04-24 (×2): 1.5 g via INTRAVENOUS
  Filled 2019-04-23 (×4): qty 4

## 2019-04-23 MED ORDER — AMLODIPINE BESYLATE 10 MG PO TABS
10.0000 mg | ORAL_TABLET | Freq: Every day | ORAL | Status: DC
  Administered 2019-04-24 (×2): 10 mg via ORAL
  Filled 2019-04-23 (×2): qty 1

## 2019-04-23 MED ORDER — AZITHROMYCIN 250 MG PO TABS
500.0000 mg | ORAL_TABLET | Freq: Once | ORAL | Status: AC
  Administered 2019-04-23: 500 mg via ORAL
  Filled 2019-04-23: qty 2

## 2019-04-23 MED ORDER — DOXERCALCIFEROL 4 MCG/2ML IV SOLN
INTRAVENOUS | Status: AC
  Administered 2019-04-23: 6 ug via INTRAVENOUS
  Filled 2019-04-23: qty 4

## 2019-04-23 MED ORDER — HEPARIN SODIUM (PORCINE) 5000 UNIT/ML IJ SOLN
5000.0000 [IU] | Freq: Three times a day (TID) | INTRAMUSCULAR | Status: DC
  Administered 2019-04-24 – 2019-04-25 (×5): 5000 [IU] via SUBCUTANEOUS
  Filled 2019-04-23 (×4): qty 1

## 2019-04-23 MED ORDER — GABAPENTIN 100 MG PO CAPS
100.0000 mg | ORAL_CAPSULE | Freq: Every day | ORAL | Status: DC
  Administered 2019-04-24 (×2): 100 mg via ORAL
  Filled 2019-04-23 (×2): qty 1

## 2019-04-23 MED ORDER — PENTAFLUOROPROP-TETRAFLUOROETH EX AERO
1.0000 "application " | INHALATION_SPRAY | CUTANEOUS | Status: DC | PRN
  Filled 2019-04-23: qty 116

## 2019-04-23 MED ORDER — HEPARIN SODIUM (PORCINE) 1000 UNIT/ML DIALYSIS
4000.0000 [IU] | Freq: Once | INTRAMUSCULAR | Status: DC
  Filled 2019-04-23: qty 4

## 2019-04-23 MED ORDER — SENNOSIDES-DOCUSATE SODIUM 8.6-50 MG PO TABS
2.0000 | ORAL_TABLET | Freq: Two times a day (BID) | ORAL | Status: DC
  Administered 2019-04-24 – 2019-04-25 (×3): 2 via ORAL
  Filled 2019-04-23 (×3): qty 2

## 2019-04-23 MED ORDER — HEPARIN SOD (PORK) LOCK FLUSH 100 UNIT/ML IV SOLN
500.0000 [IU] | Freq: Once | INTRAVENOUS | Status: AC | PRN
  Administered 2019-04-23: 500 [IU]
  Filled 2019-04-23: qty 5

## 2019-04-23 MED ORDER — PROCHLORPERAZINE MALEATE 10 MG PO TABS
ORAL_TABLET | ORAL | Status: AC
  Filled 2019-04-23: qty 1

## 2019-04-23 MED ORDER — OXYCODONE-ACETAMINOPHEN 5-325 MG PO TABS
1.0000 | ORAL_TABLET | ORAL | Status: AC
  Administered 2019-04-23: 1 via ORAL

## 2019-04-23 MED ORDER — OXYCODONE HCL 5 MG PO TABS: 5.0000 mg | ORAL_TABLET | Freq: Four times a day (QID) | ORAL | Status: DC | PRN

## 2019-04-23 NOTE — ED Triage Notes (Signed)
Pt states sob since last night, states she is due for dialysis today, but when she was at the cancer center today having her chemo pump removed (being treated for liver ca) Dr. Sharol Roussel suggested she come to the ER for how her lungs sounded. Pt states that she last had dialysis wed (normally would go Friday but was going today due to schedule change). Pt denies cp, fevers, chills or swelling in legs. Does have cough.

## 2019-04-23 NOTE — Telephone Encounter (Signed)
Patient came in for pump DC, and complaints nausea, no vomiting, cough and shortness of breath since last night, with clear white phlegm.  No fever, vital signs in the infusion room shows blood pressure 183/98, heart rate 84, respirate 20, temperature 36.6, pulse ox 95% on room air.  She appears uncomfortable, slightly tachypneic, exam showed diffuse rhonchi and wheezing, especially in the right side lung.  She is due for hemodialysis today.  I recommend [facility] ED evaluation for her acute respiratory symptoms, to rule out COVID, and penumonia. She is on chemotherapy for metastatic cholangiocarcinoma, immunocompromised.  She will need dialysis at [facility] today. I will inform [facility] ED. Her daughter will drive her to [facility] ED.   I gave her one dose oral Compazine and Percocet for her nausea and abdominal pain before she leaves.   Truitt Merle

## 2019-04-23 NOTE — Consult Note (Signed)
[facility] Renal Consultation Note    Indication for Consultation:  Management of ESRD/hemodialysis; anemia, hypertension/volume and secondary hyperparathyroidism PCP:  HPI: Mary Chapman is a 73 y.o. female with ESRD on hemodialysis MWF at [facility]. PMH: Intrahepatic cholangio cancer followed by Dr. Burr Medico, ESRD 2/2 FSGS, HTN, AOCD, SHPT.   Patient has been having chemotherapy initially started on Cisplatin/Gemcitabine then started on 2nd line Folfox per Dr. Burr Medico. She missed HD 04/22/2019 to have chemotherapy and was supposed to return to HD center today for make up treatment. She says she started feeling SOB last PM. She went to [facility] today to have chemo pump removed and noted to be hypertensive with tachypnea with diffuse rhonchi and wheezing. She was sent to [facility] ED for evaluation and HD.   CXR shows small right pleural effusion with associated mild to moderate R basilar atelectasis/airspace disease, mild L basilar atelectasis/airspace disease. BC X 2 have been drawn, She has received Azithromycin 500 mg PO, ceftriaxone IV is pending. K+ 5.4 CO2 24 SCr 8.19 BUN 32 BS 107 WBC 5.5 HGB 9.4 PLT 199. COVID 19 negative.  Seen in ED, she has BLE 1+ pitting edema. O2 sats noted to be 89-90% on RA. Started on O2 at 2L/M. She has diffuse rhonchi upper airway, few scattered expiratory wheezes, mild WOB present. She is alert, oriented X 3 at baseline. She denies fever, chills, no SOB until last PM. Will order urgent HD for volume removal.    Past Medical History:  Diagnosis Date  . Diverticulitis   . Focal segmental glomerulosclerosis    ESRD on MWF HD  . Hypertension   . intrahepatic cholangio ca dx'd10/2019  . Renal insufficiency    dialysis pt MWF  . Tobacco abuse    Past Surgical History:  Procedure Laterality Date  . AV FISTULA PLACEMENT Left 03/18/2017   Procedure: Brachiocephalic ARTERIOVENOUS (AV) FISTULA CREATION left arm;  Surgeon: Elam Dutch, MD;  Location: [place];   Service: Vascular;  Laterality: Left;  . BIOPSY  03/05/2018   Procedure: BIOPSY;  Surgeon: Rush Landmark Telford Nab., MD;  Location: [place];  Service: Gastroenterology;;  enteroscopy bx /cytology brushing Greig Castilla bx  . COLONOSCOPY WITH PROPOFOL N/A 03/05/2018   Procedure: COLONOSCOPY WITH PROPOFOL;  Surgeon: Rush Landmark Telford Nab., MD;  Location: [place];  Service: Gastroenterology;  Laterality: N/A;  Bx, Spot  . ENTEROSCOPY N/A 03/05/2018   Procedure: ENTEROSCOPY;  Surgeon: Rush Landmark Telford Nab., MD;  Location: [place];  Service: Gastroenterology;  Laterality: N/A;  BX, Brushing, & Spot  . INSERTION OF DIALYSIS CATHETER N/A 03/18/2017   Procedure: INSERTION OF DIALYSIS CATHETER;  Surgeon: Elam Dutch, MD;  Location: [place];  Service: Vascular;  Laterality: N/A;  . IR GENERIC HISTORICAL  08/15/2016   IR US GUIDE VASC ACCESS RIGHT 08/15/2016 Arne Cleveland, MD [facility]  . IR GENERIC HISTORICAL  08/15/2016   IR FLUORO GUIDE CV LINE RIGHT 08/15/2016 Arne Cleveland, MD [facility]  . IR IMAGING GUIDED PORT INSERTION  05/06/2018  . SUBMUCOSAL INJECTION  03/05/2018   Procedure: SUBMUCOSAL INJECTION;  Surgeon: Rush Landmark Telford Nab., MD;  Location: [place];  Service: Gastroenterology;;  spot tatoo  . TUBAL LIGATION     Family History  Problem Relation Age of Onset  . Hypertension Mother   . Diabetes Mother   . Hypertension Father   . Diabetes Father   . Hypertension Sister   . Hypertension Brother    Social History:  reports that she has been  smoking cigarettes. She has a 5.00 pack-year smoking history. She has never used smokeless tobacco. She reports that she does not drink alcohol or use drugs. No Known Allergies Prior to Admission medications   Medication Sig Start Date End Date Taking? Authorizing Provider  amLODipine (NORVASC) 10 MG tablet Take 10 mg by mouth at bedtime.  09/15/18  Yes [provider]  folic acid (FOLVITE) 1 MG tablet Take 1  tablet (1 mg total) by mouth daily. 04/16/18  Yes Hall, Carole N, DO  gabapentin (NEURONTIN) 100 MG capsule TAKE 1 CAPSULE (100 MG TOTAL) BY MOUTH AT BEDTIME. Patient taking differently: Take 100 mg by mouth at bedtime as needed (pain).  12/27/18  Yes Truitt Merle, MD  metoprolol tartrate (LOPRESSOR) 25 MG tablet Take 0.5 tablets (12.5 mg total) by mouth 2 (two) times daily. 04/16/18  Yes Kayleen Memos, DO  multivitamin (RENA-VIT) TABS tablet Take 1 tablet by mouth at bedtime. Patient taking differently: Take 1 tablet by mouth 3 (three) times a week.  08/20/16  Yes Rama, Venetia Maxon, MD  Nutritional Supplements (FEEDING SUPPLEMENT, NEPRO CARB STEADY,) LIQD Take 237 mLs by mouth 3 (three) times daily as needed (Supplement). 03/06/18  Yes Ghimire, Henreitta Leber, MD  Oxycodone HCl 10 MG TABS Take 1 tablet (10 mg total) by mouth every 8 (eight) hours as needed. Patient taking differently: Take 10 mg by mouth every 8 (eight) hours as needed (pain).  03/24/19  Yes Truitt Merle, MD  pantoprazole (PROTONIX) 40 MG tablet TAKE 1 TABLET BY MOUTH TWICE A DAY Patient taking differently: Take 40 mg by mouth 2 (two) times daily.  04/21/19  Yes Truitt Merle, MD  polyethylene glycol [facility]) packet Take 17 g by mouth daily. Patient taking differently: Take 17 g by mouth daily as needed for mild constipation.  03/06/18  Yes Ghimire, Henreitta Leber, MD  senna-docusate (SENOKOT-S) 8.6-50 MG tablet Take 2 tablets by mouth 2 (two) times daily. Patient taking differently: Take 2 tablets by mouth at bedtime as needed for mild constipation.  04/16/18  Yes Kayleen Memos, DO  Vitamin D, Ergocalciferol, (DRISDOL) 50000 units CAPS capsule Take 50,000 Units by mouth once a week. 01/16/17  Yes [provider]  oxyCODONE (OXY IR/ROXICODONE) 5 MG immediate release tablet Take 1 tablet (5 mg total) by mouth every 6 (six) hours as needed for severe pain. Patient not taking: Reported on 04/23/2019 01/27/19   Alla Feeling, NP  prochlorperazine  (COMPAZINE) 10 MG tablet Take 1 tablet (10 mg total) by mouth every 6 (six) hours as needed (Nausea or vomiting). 05/10/18 02/22/19  Truitt Merle, MD   Current Facility-Administered Medications  Medication Dose Route Frequency Provider Last Rate Last Dose  . cefTRIAXone (ROCEPHIN) 1 g in sodium chloride 0.9 % 100 mL IVPB  1 g Intravenous Once Tacy Learn, PA-C   Stopped at 04/23/19 1340  . Chlorhexidine Gluconate Cloth 2 % PADS 6 each  6 each Topical Daily Maudie Flakes, MD      . sodium chloride flush (NS) 0.9 % injection 10-40 mL  10-40 mL Intracatheter PRN Maudie Flakes, MD      . sodium chloride flush (NS) 0.9 % injection 3 mL  3 mL Intravenous Once Maudie Flakes, MD       Current Outpatient Medications  Medication Sig Dispense Refill  . amLODipine (NORVASC) 10 MG tablet Take 10 mg by mouth at bedtime.     . folic acid (FOLVITE) 1 MG tablet Take  1 tablet (1 mg total) by mouth daily. 30 tablet 0  . gabapentin (NEURONTIN) 100 MG capsule TAKE 1 CAPSULE (100 MG TOTAL) BY MOUTH AT BEDTIME. (Patient taking differently: Take 100 mg by mouth at bedtime as needed (pain). ) 90 capsule 2  . metoprolol tartrate (LOPRESSOR) 25 MG tablet Take 0.5 tablets (12.5 mg total) by mouth 2 (two) times daily. 30 tablet 0  . multivitamin (RENA-VIT) TABS tablet Take 1 tablet by mouth at bedtime. (Patient taking differently: Take 1 tablet by mouth 3 (three) times a week. ) 30 tablet 3  . Nutritional Supplements (FEEDING SUPPLEMENT, NEPRO CARB STEADY,) LIQD Take 237 mLs by mouth 3 (three) times daily as needed (Supplement). 90 Can 0  . Oxycodone HCl 10 MG TABS Take 1 tablet (10 mg total) by mouth every 8 (eight) hours as needed. (Patient taking differently: Take 10 mg by mouth every 8 (eight) hours as needed (pain). ) 60 tablet 0  . pantoprazole (PROTONIX) 40 MG tablet TAKE 1 TABLET BY MOUTH TWICE A DAY (Patient taking differently: Take 40 mg by mouth 2 (two) times daily. ) 60 tablet 1  . polyethylene glycol  (MIRALAX) packet Take 17 g by mouth daily. (Patient taking differently: Take 17 g by mouth daily as needed for mild constipation. ) 30 each 0  . senna-docusate (SENOKOT-S) 8.6-50 MG tablet Take 2 tablets by mouth 2 (two) times daily. (Patient taking differently: Take 2 tablets by mouth at bedtime as needed for mild constipation. ) 30 tablet 0  . Vitamin D, Ergocalciferol, (DRISDOL) 50000 units CAPS capsule Take 50,000 Units by mouth once a week.  5  . oxyCODONE (OXY IR/ROXICODONE) 5 MG immediate release tablet Take 1 tablet (5 mg total) by mouth every 6 (six) hours as needed for severe pain. (Patient not taking: Reported on 04/23/2019) 30 tablet 0   Labs: Basic Metabolic Panel: Recent Labs  Lab 04/21/19 1042 04/23/19 1153  NA 140 140  K 4.8 5.4*  CL 103 104  CO2 30 24  GLUCOSE 88 107*  BUN 11 32*  CREATININE 4.75* 8.19*  CALCIUM 7.5* 7.6*   Liver Function Tests: Recent Labs  Lab 04/21/19 1042  AST 15  ALT <6  ALKPHOS 110  BILITOT 0.4  PROT 6.6  ALBUMIN 2.9*   No results for input(s): LIPASE, AMYLASE in the last 168 hours. No results for input(s): AMMONIA in the last 168 hours. CBC: Recent Labs  Lab 04/21/19 1042 04/23/19 1153  WBC 5.2 5.5  NEUTROABS 3.3  --   HGB 9.1* 9.4*  HCT 30.2* 31.8*  MCV 97.1 99.7  PLT 179 199   Cardiac Enzymes: No results for input(s): CKTOTAL, CKMB, CKMBINDEX, TROPONINI in the last 168 hours. CBG: No results for input(s): GLUCAP in the last 168 hours. Iron Studies: No results for input(s): IRON, TIBC, TRANSFERRIN, FERRITIN in the last 72 hours. Studies/Results: Dg Chest 2 View  Result Date: 04/23/2019 CLINICAL DATA:  Shortness of breath and cough EXAM: CHEST - 2 VIEW COMPARISON:  Chest CT dated 02/22/2019 and chest radiograph dated 12/17/2018 FINDINGS: The heart remains enlarged. A right internal jugular central venous port catheter is unchanged. There is a small right pleural effusion with associated mild-to-moderate right basilar  atelectasis/airspace disease. There is minimal left basilar atelectasis/airspace disease. There is no left pleural effusion. There is no pneumothorax. Degenerative changes are seen in the spine. IMPRESSION: 1. Small right pleural effusion with associated mild-to-moderate right basilar atelectasis/airspace disease. Aspiration is a consideration. 2. Minimal left basilar  atelectasis/airspace disease. 3. Unchanged cardiomegaly. Electronically Signed   By: Zerita Boers M.D.   On: 04/23/2019 12:56    ROS: As per HPI otherwise negative.    Physical Exam: Vitals:   04/23/19 1330 04/23/19 1400 04/23/19 1445 04/23/19 1530  BP: (!) 173/93 (!) 170/95 (!) 173/95 (!) 169/98  Pulse: 73 79 74 75  Resp: 15 (!) 23 18 12   Temp:      TempSrc:      SpO2: 92% 97% 94% 100%     General: Pleasant elderly female no acute distress. Head: Normocephalic, atraumatic, sclera non-icteric, mucus membranes are moist Neck: Supple. JVD 1/3 to mandible Lungs: Bilateral breath sounds decreased R base with scattered upper airway rhonchi-few bibasilar crackles, few end expiratory wheeze. Mild WOB present.  Heart: RRR with S1 S2. No murmurs, rubs, or gallops appreciated. SR on monitor.  Abdomen: Soft, non-tender, non-distended with normoactive bowel sounds. No rebound/guarding. No obvious abdominal masses. M-S:  Strength and tone appear normal for age. Lower extremities: 1+ pitting BLE edema, L > R Neuro: Alert and oriented X 3. Moves all extremities spontaneously. Psych:  Responds to questions appropriately with a normal affect. Dialysis Access: L AVF + bruit  Dialysis Orders: [place] MWF 4 hrs 180NRe 400/800 65 kg 2.0 K/ 2.5 Ca UFP 2 L AVF -Heparin 40000 units IV initial bolus Heparin 2000 units IV mid run -Hectorol 6 mcg IV TIW -Mircera 225 mcg IV q 2 weeks (Last dose 04/10/2019)  Assessment/Plan: 1. Volume overload in setting of missed HD: Has been leaving under EDW, try to lower wt here.  2.  Possible PNA-BC  pending. ABX started. Afebrile. WBC 5.5 3.  Intrahepatic cholangiocarcinoma cT2N1MO stage IIIB, unresectable, MSI-stable. Followed by Dr. Burr Medico. 4.  ESRD - MWF via AVF. She is compliant with HD but misses Friday when she has chemo and makes up on Saturday. Was too sick to go today. K+ noted-she is appropriate for 2.0 K bath. Usual heparin.  5.  Hypertension/volume  - BP and volume up. Should improve with HD. Continue amlodipine 10 mg PO Q hs.  6.  Anemia  - She has been receiving ESA as OP, due today. Give Aranesp 200 mcg IV today.  7.  Metabolic bone disease - Ca 7.6 C Ca 8.5 Last OP Phos at goal. Continue VDRA, no binders on OP med list.  8.  Nutrition - Albumin 2.9 Renal diet when able to eat, start prostat, renal vits.   Rheta Hemmelgarn H. Owens Shark, NP-C 04/23/2019, 4:57 PM  [employer] [phone]

## 2019-04-23 NOTE — Progress Notes (Signed)
Patient reported for pump d/c. She states overall she feels generally unwell. She vomited last night after dinner. She took a zofran. She has only been able to sip water since. She complains today she is "more gaggy" today with dry heaves, productive coughing and increased shortness of breath. Patient has emesis bag with tissues on her lap. Patient is very fatigued. She states she has dialysis on Monday and this will all be better by then.Encourgaed  patient to be evaluated in the event she may need fluids. Patient declined assessment. Strongly encouraged her to be evaluated now since an MD is here rather than go to the ED later. Patient agrees.  BP is elevated slightly which is close to her baseline. Dr. Burr Medico contacted to evaluate patient in infusion room. VO given for Compazine PO and Percocet PO. Dr. Burr Medico advised patient to go to [facility] ED now.   Patient educated to go directly to [facility] ED. Patient escorted to front lobby via wheelchair.  Daughter advised by this RN  Dr. Ernestina Penna advises to bring her the [facility] ED now. Daughter verbalized an understanding and states they are on the way now, no stops prior.

## 2019-04-23 NOTE — ED Provider Notes (Signed)
[facility] DEPART[facility] Provider Note   CSN: [id number] Arrival date & time: 04/23/19  1117     History   Chief Complaint Chief Complaint  Patient presents with  . Shortness of Breath    HPI Mary Chapman is a 73 y.o. female.     73 year old female with history of end-stage renal disease on dialysis, also liver cancer, currently undergoing treatment presents with complaint of shortness of breath.  Patient states that she typically attends dialysis Monday, Wednesday, Saturday (is unable to go to Friday dialysis due to pump treatment for her liver cancer so she typically has her pump removed Saturday morning and goes for dialysis afterwards).  Patient states that she mentioned to her oncologist today that she was feeling short of breath, oncologist listen to her lungs and advised her to go to the emergency room. Also reports cough with clear sputum as well as vomiting.  Patient was given Compazine and Percocet this morning at infusion center. Denies fevers, chills.      Past Medical History:  Diagnosis Date  . Diverticulitis   . Focal segmental glomerulosclerosis    ESRD on MWF HD  . Hypertension   . intrahepatic cholangio ca dx'd10/2019  . Renal insufficiency    dialysis pt MWF  . Tobacco abuse     Patient Active Problem List   Diagnosis Date Noted  . Genetic testing 11/30/2018  . Port-A-Cath in place 05/18/2018  . Cough   . Cholangiocarcinoma ([place])   . Sepsis ([place]) 04/09/2018  . Intrahepatic cholangiocarcinoma ([place]) 03/11/2018  . Mass of cecum with bleeding 03/04/2018  . GI bleed 03/01/2018  . Gastrointestinal hemorrhage associated with anorectal source   . Mass of right lobe of liver   . Diverticulitis 02/23/2018  . ESRD on dialysis ([place]) 02/23/2018  . Hypophosphatemia 03/17/2017  . Hyperphosphatemia 03/17/2017  . Elevated troponin 03/16/2017  . Hyperlipidemia 03/16/2017  . SOB (shortness of breath) 03/16/2017  . Focal segmental  glomerulosclerosis 03/15/2017  . Nephrotic syndrome 08/20/2016  . Essential hypertension 08/08/2016  . Dyspnea 08/08/2016  . Tobacco abuse   . Anasarca 08/07/2016    Past Surgical History:  Procedure Laterality Date  . AV FISTULA PLACEMENT Left 03/18/2017   Procedure: Brachiocephalic ARTERIOVENOUS (AV) FISTULA CREATION left arm;  Surgeon: Elam Dutch, MD;  Location: [facility];  Service: Vascular;  Laterality: Left;  . BIOPSY  03/05/2018   Procedure: BIOPSY;  Surgeon: Rush Landmark Telford Nab., MD;  Location: [facility];  Service: Gastroenterology;;  enteroscopy bx /cytology brushing Greig Castilla bx  . COLONOSCOPY WITH PROPOFOL N/A 03/05/2018   Procedure: COLONOSCOPY WITH PROPOFOL;  Surgeon: Rush Landmark Telford Nab., MD;  Location: [facility];  Service: Gastroenterology;  Laterality: N/A;  Bx, Spot  . ENTEROSCOPY N/A 03/05/2018   Procedure: ENTEROSCOPY;  Surgeon: Rush Landmark Telford Nab., MD;  Location: [facility];  Service: Gastroenterology;  Laterality: N/A;  BX, Brushing, & Spot  . INSERTION OF DIALYSIS CATHETER N/A 03/18/2017   Procedure: INSERTION OF DIALYSIS CATHETER;  Surgeon: Elam Dutch, MD;  Location: [facility];  Service: Vascular;  Laterality: N/A;  . IR GENERIC HISTORICAL  08/15/2016   IR US GUIDE VASC ACCESS RIGHT 08/15/2016 Arne Cleveland, MD [facility]  . IR GENERIC HISTORICAL  08/15/2016   IR FLUORO GUIDE CV LINE RIGHT 08/15/2016 Arne Cleveland, MD [facility]  . IR IMAGING GUIDED PORT INSERTION  05/06/2018  . SUBMUCOSAL INJECTION  03/05/2018   Procedure: SUBMUCOSAL INJECTION;  Surgeon: Rush Landmark Telford Nab., MD;  Location[facility]  ENDOSCOPY;  Service: Gastroenterology;;  spot tatoo  . TUBAL LIGATION       OB History   No obstetric history on file.      Home Medications    Prior to Admission medications   Medication Sig Start Date End Date Taking? Authorizing Provider  amLODipine (NORVASC) 10 MG tablet Take 10 mg by mouth at bedtime.  09/15/18  Yes [provider]  folic acid (FOLVITE) 1 MG tablet Take 1 tablet (1 mg total) by mouth daily. 04/16/18  Yes Hall, Carole N, DO  gabapentin (NEURONTIN) 100 MG capsule TAKE 1 CAPSULE (100 MG TOTAL) BY MOUTH AT BEDTIME. Patient taking differently: Take 100 mg by mouth at bedtime as needed (pain).  12/27/18  Yes Truitt Merle, MD  metoprolol tartrate (LOPRESSOR) 25 MG tablet Take 0.5 tablets (12.5 mg total) by mouth 2 (two) times daily. 04/16/18  Yes Kayleen Memos, DO  multivitamin (RENA-VIT) TABS tablet Take 1 tablet by mouth at bedtime. Patient taking differently: Take 1 tablet by mouth 3 (three) times a week.  08/20/16  Yes Rama, Venetia Maxon, MD  Nutritional Supplements (FEEDING SUPPLEMENT, NEPRO CARB STEADY,) LIQD Take 237 mLs by mouth 3 (three) times daily as needed (Supplement). 03/06/18  Yes Ghimire, Henreitta Leber, MD  Oxycodone HCl 10 MG TABS Take 1 tablet (10 mg total) by mouth every 8 (eight) hours as needed. Patient taking differently: Take 10 mg by mouth every 8 (eight) hours as needed (pain).  03/24/19  Yes Truitt Merle, MD  pantoprazole (PROTONIX) 40 MG tablet TAKE 1 TABLET BY MOUTH TWICE A DAY Patient taking differently: Take 40 mg by mouth 2 (two) times daily.  04/21/19  Yes Truitt Merle, MD  polyethylene glycol [facility]) packet Take 17 g by mouth daily. Patient taking differently: Take 17 g by mouth daily as needed for mild constipation.  03/06/18  Yes Ghimire, Henreitta Leber, MD  senna-docusate (SENOKOT-S) 8.6-50 MG tablet Take 2 tablets by mouth 2 (two) times daily. Patient taking differently: Take 2 tablets by mouth at bedtime as needed for mild constipation.  04/16/18  Yes Kayleen Memos, DO  Vitamin D, Ergocalciferol, (DRISDOL) 50000 units CAPS capsule Take 50,000 Units by mouth once a week. 01/16/17  Yes [provider]  oxyCODONE (OXY IR/ROXICODONE) 5 MG immediate release tablet Take 1 tablet (5 mg total) by mouth every 6 (six) hours as needed for severe pain. Patient not taking: Reported on  04/23/2019 01/27/19   Alla Feeling, NP  prochlorperazine (COMPAZINE) 10 MG tablet Take 1 tablet (10 mg total) by mouth every 6 (six) hours as needed (Nausea or vomiting). 05/10/18 02/22/19  Truitt Merle, MD    Family History Family History  Problem Relation Age of Onset  . Hypertension Mother   . Diabetes Mother   . Hypertension Father   . Diabetes Father   . Hypertension Sister   . Hypertension Brother     Social History Social History   Tobacco Use  . Smoking status: Current Some Day Smoker    Packs/day: 0.25    Years: 20.00    Pack years: 5.00    Types: Cigarettes  . Smokeless tobacco: Never Used  . Tobacco comment: 3 cigs/day  Substance Use Topics  . Alcohol use: No    Comment: moderate drinker for 10 years, quit in 30 years  . Drug use: No     Allergies   Patient has no known allergies.   Review of Systems Review of Systems  Constitutional:  Positive for fatigue. Negative for chills and fever.  HENT: Negative for congestion.   Respiratory: Positive for cough and shortness of breath.   Cardiovascular: Negative for chest pain and leg swelling.  Gastrointestinal: Positive for nausea and vomiting. Negative for abdominal pain.  Musculoskeletal: Negative for arthralgias and myalgias.  Skin: Negative for rash and wound.  Allergic/Immunologic: Positive for immunocompromised state.  Neurological: Negative for weakness.  Hematological: Does not bruise/bleed easily.  Psychiatric/Behavioral: Negative for confusion.  All other systems reviewed and are negative.    Physical Exam Updated Vital Signs BP (!) 169/98   Pulse 75   Temp 98.7 F (37.1 C) (Oral)   Resp 12   SpO2 100%   Physical Exam Vitals signs and nursing note reviewed.  Constitutional:      General: She is not in acute distress.    Appearance: She is well-developed. She is not diaphoretic.  HENT:     Head: Normocephalic and atraumatic.  Cardiovascular:     Rate and Rhythm: Normal rate and regular  rhythm.  Pulmonary:     Effort: Pulmonary effort is normal. Tachypnea present.     Breath sounds: Examination of the right-lower field reveals wheezing and rhonchi. Examination of the left-lower field reveals wheezing and rhonchi. Wheezing and rhonchi present.  Chest:     Chest wall: No tenderness.  Musculoskeletal:     Right lower leg: Edema present.     Left lower leg: Edema present.     Comments: Slight pitting edema bilateral lower legs  Skin:    General: Skin is warm and dry.     Findings: No erythema or rash.  Neurological:     Mental Status: She is alert and oriented to person, place, and time.  Psychiatric:        Behavior: Behavior normal.      ED Treatments / Results  Labs (all labs ordered are listed, but only abnormal results are displayed) Labs Reviewed  BASIC METABOLIC PANEL - Abnormal; Notable for the following components:      Result Value   Potassium 5.4 (*)    Glucose, Bld 107 (*)    BUN 32 (*)    Creatinine, Ser 8.19 (*)    Calcium 7.6 (*)    GFR calc non Af Amer 4 (*)    GFR calc Af Amer 5 (*)    All other components within normal limits  CBC - Abnormal; Notable for the following components:   RBC 3.19 (*)    Hemoglobin 9.4 (*)    HCT 31.8 (*)    MCHC 29.6 (*)    RDW 19.1 (*)    All other components within normal limits  TROPONIN I (HIGH SENSITIVITY) - Abnormal; Notable for the following components:   Troponin I (High Sensitivity) 25 (*)    All other components within normal limits  TROPONIN I (HIGH SENSITIVITY) - Abnormal; Notable for the following components:   Troponin I (High Sensitivity) 26 (*)    All other components within normal limits  CULTURE, BLOOD (ROUTINE X 2)  CULTURE, BLOOD (ROUTINE X 2)  SARS CORONAVIRUS 2 (TAT 6-24 HRS)  POC SARS CORONAVIRUS 2 AG -  ED    EKG EKG Interpretation  Date/Time:  Saturday April 23 2019 12:12:56 EST Ventricular Rate:  83 PR Interval:  180 QRS Duration: 74 QT Interval:  394 QTC  Calculation: 462 R Axis:   19 Text Interpretation: Normal sinus rhythm with sinus arrhythmia Anterior infarct , age undetermined Abnormal ECG Confirmed  by Gerlene Fee [phone]) on 04/23/2019 1:09:28 PM   Radiology Dg Chest 2 View  Result Date: 04/23/2019 CLINICAL DATA:  Shortness of breath and cough EXAM: CHEST - 2 VIEW COMPARISON:  Chest CT dated 02/22/2019 and chest radiograph dated 12/17/2018 FINDINGS: The heart remains enlarged. A right internal jugular central venous port catheter is unchanged. There is a small right pleural effusion with associated mild-to-moderate right basilar atelectasis/airspace disease. There is minimal left basilar atelectasis/airspace disease. There is no left pleural effusion. There is no pneumothorax. Degenerative changes are seen in the spine. IMPRESSION: 1. Small right pleural effusion with associated mild-to-moderate right basilar atelectasis/airspace disease. Aspiration is a consideration. 2. Minimal left basilar atelectasis/airspace disease. 3. Unchanged cardiomegaly. Electronically Signed   By: Zerita Boers M.D.   On: 04/23/2019 12:56    Procedures Procedures (including critical care time)  Medications Ordered in ED Medications  sodium chloride flush (NS) 0.9 % injection 3 mL (3 mLs Intravenous Not Given 04/23/19 1434)  cefTRIAXone (ROCEPHIN) 1 g in sodium chloride 0.9 % 100 mL IVPB (0 g Intravenous Hold 04/23/19 1340)  sodium chloride flush (NS) 0.9 % injection 10-40 mL (has no administration in time range)  Chlorhexidine Gluconate Cloth 2 % PADS 6 each (has no administration in time range)  doxercalciferol (HECTOROL) injection 6 mcg (has no administration in time range)  Darbepoetin Alfa (ARANESP) injection 100 mcg (has no administration in time range)  azithromycin (ZITHROMAX) tablet 500 mg (500 mg Oral Given 04/23/19 1433)     Initial Impression / Assessment and Plan / ED Course  I have reviewed the triage vital signs and the nursing  notes.  Pertinent labs & imaging results that were available during my care of the patient were reviewed by me and considered in my medical decision making (see chart for details).  Clinical Course as of Apr 22 1816  Sat Dec 05, 699  186 73 year old female currently undergoing treatment for hepatocellular carcinoma, also on dialysis for end-stage renal disease.  Patient states that she is not feeling well today, reports fatigue, vomiting, shortness of breath and a cough, went to the cancer center for removal of a pump for her treatment with plan to go to dialysis afterwards however was noted to not be feeling well by oncology staff who noticed she had wheezing and rhonchi in the lower fields and sent her to the ER for evaluation of Covid versus pneumonia prior to getting dialysis today.  On exam patient has ongoing wheezing and rhonchi in the lower fields, intermittent mild tachypnea remaining O2 sat at 100% on room air.  Chest x-ray concerning for right lower lobe pneumonia, was given Rocephin and Zithromax.  Case discussed with nephrology who will arrange for dialysis with plan for hospitalist admission for patient's pneumonia as well as trending patient's troponin, initially 25, repeat at 26, possibly due to needing dialysis.  Calcium mildly elevated at 5.4, no significant changes to CBC.   [LM]  1809 Case discussed with Dr. Eduard Clos with hospitalist service who will admit.   [LM]    Clinical Course User Index [LM] Tacy Learn, PA-C      Final Clinical Impressions(s) / ED Diagnoses   Final diagnoses:  Community acquired pneumonia of right lower lobe of lung  Elevated troponin  Hyperkalemia    ED Discharge Orders    None       Roque Lias 04/23/19 1817    Maudie Flakes, MD 04/24/19 2007

## 2019-04-23 NOTE — H&P (Addendum)
.  History and Physical    Mary Chapman BWI:[id number] DOB: [birth year] DOA: 04/23/2019  PCP: Patient, No Pcp Per Patient coming from: Oncologist Office   Chief Complaint: Shortness of Breath  HPI: Mary Chapman is a 73 year old female with a past medical history of end-stage renal disease HD Monday Tuesday Saturday, metastatic cholangiocarcinoma who presented to the emergency department with 2 days of shortness of breath and cough.  Specifically the patient reports to me she has experienced gradual increase in her shortness of breath and some dependent edema, she denies any Covid exposures loss of smell or taste and reports subjective fever.  She denies any dysphagia diarrhea or vomiting.  Patient reports she presented to her oncologist office to have a pump removed on that point given her bilateral crackles seen by the oncologist on exam was referred to the emergency department.  In the ED rapid Covid testing was negative and PCR-based testing is pending.  Past medical records reviewed and summarized: Physician phone encounter from earlier today reporting the patient was coughing with shortness of breath and is immunocompromised with metastatic cholangiocarcinoma. Independent review and interpretation of the patient's ECG which is sinus rhythm with a ventricular rate of 83 QTC 462 Independent review and interpretation of the patient's chest x-ray image with right lower lobe infiltrate  ED Course: Received Ceftriaxone and Azithromycin  Review of Systems:  Constitutional: Denies Weight Loss, Fever, Chills  Respiratory: Reports Shortness of Breath, Cough Denies Hemoptysis, Wheezing, Pleurisy Cardiovascular: Denies Chest Pain, Paroxsymal Nocturnal Dyspnea, Palpitations Reports Edema Allergy/Immun: Reports Immunocompromised All other systems were reviewed and are negative   Past Medical History:  Diagnosis Date  . Diverticulitis   . Focal segmental glomerulosclerosis    ESRD on MWF HD  .  Hypertension   . intrahepatic cholangio ca dx'd10/2019  . Renal insufficiency    dialysis pt MWF  . Tobacco abuse     Past Surgical History:  Procedure Laterality Date  . AV FISTULA PLACEMENT Left 03/18/2017   Procedure: Brachiocephalic ARTERIOVENOUS (AV) FISTULA CREATION left arm;  Surgeon: Elam Dutch, MD;  Location: [place];  Service: Vascular;  Laterality: Left;  . BIOPSY  03/05/2018   Procedure: BIOPSY;  Surgeon: Rush Landmark Telford Nab., MD;  Location: [place];  Service: Gastroenterology;;  enteroscopy bx /cytology brushing Greig Castilla bx  . COLONOSCOPY WITH PROPOFOL N/A 03/05/2018   Procedure: COLONOSCOPY WITH PROPOFOL;  Surgeon: Rush Landmark Telford Nab., MD;  Location: [place];  Service: Gastroenterology;  Laterality: N/A;  Bx, Spot  . ENTEROSCOPY N/A 03/05/2018   Procedure: ENTEROSCOPY;  Surgeon: Rush Landmark Telford Nab., MD;  Location: [place];  Service: Gastroenterology;  Laterality: N/A;  BX, Brushing, & Spot  . INSERTION OF DIALYSIS CATHETER N/A 03/18/2017   Procedure: INSERTION OF DIALYSIS CATHETER;  Surgeon: Elam Dutch, MD;  Location: [place];  Service: Vascular;  Laterality: N/A;  . IR GENERIC HISTORICAL  08/15/2016   IR US GUIDE VASC ACCESS RIGHT 08/15/2016 Arne Cleveland, MD [facility]  . IR GENERIC HISTORICAL  08/15/2016   IR FLUORO GUIDE CV LINE RIGHT 08/15/2016 Arne Cleveland, MD [facility]  . IR IMAGING GUIDED PORT INSERTION  05/06/2018  . SUBMUCOSAL INJECTION  03/05/2018   Procedure: SUBMUCOSAL INJECTION;  Surgeon: Rush Landmark Telford Nab., MD;  Location: [place];  Service: Gastroenterology;;  spot tatoo  . TUBAL LIGATION       reports that she has been smoking cigarettes. She has a 5.00 pack-year smoking history. She has never used smokeless tobacco. She reports that  she does not drink alcohol or use drugs.  No Known Allergies  Family History  Problem Relation Age of Onset  . Hypertension Mother   . Diabetes Mother   . Hypertension  Father   . Diabetes Father   . Hypertension Sister   . Hypertension Brother      Prior to Admission medications   Medication Sig Start Date End Date Taking? Authorizing Provider  amLODipine (NORVASC) 10 MG tablet Take 10 mg by mouth at bedtime.  09/15/18  Yes [provider]  folic acid (FOLVITE) 1 MG tablet Take 1 tablet (1 mg total) by mouth daily. 04/16/18  Yes Hall, Carole N, DO  gabapentin (NEURONTIN) 100 MG capsule TAKE 1 CAPSULE (100 MG TOTAL) BY MOUTH AT BEDTIME. Patient taking differently: Take 100 mg by mouth at bedtime as needed (pain).  12/27/18  Yes Truitt Merle, MD  metoprolol tartrate (LOPRESSOR) 25 MG tablet Take 0.5 tablets (12.5 mg total) by mouth 2 (two) times daily. 04/16/18  Yes Kayleen Memos, DO  multivitamin (RENA-VIT) TABS tablet Take 1 tablet by mouth at bedtime. Patient taking differently: Take 1 tablet by mouth 3 (three) times a week.  08/20/16  Yes Rama, Venetia Maxon, MD  Nutritional Supplements (FEEDING SUPPLEMENT, NEPRO CARB STEADY,) LIQD Take 237 mLs by mouth 3 (three) times daily as needed (Supplement). 03/06/18  Yes Ghimire, Henreitta Leber, MD  pantoprazole (PROTONIX) 40 MG tablet TAKE 1 TABLET BY MOUTH TWICE A DAY Patient taking differently: Take 40 mg by mouth 2 (two) times daily.  04/21/19  Yes Truitt Merle, MD  polyethylene glycol [facility]) packet Take 17 g by mouth daily. Patient taking differently: Take 17 g by mouth daily as needed for mild constipation.  03/06/18  Yes Ghimire, Henreitta Leber, MD  senna-docusate (SENOKOT-S) 8.6-50 MG tablet Take 2 tablets by mouth 2 (two) times daily. Patient taking differently: Take 2 tablets by mouth at bedtime as needed for mild constipation.  04/16/18  Yes Irene Pap N, DO  oxyCODONE (OXY IR/ROXICODONE) 5 MG immediate release tablet Take 1 tablet (5 mg total) by mouth every 6 (six) hours as needed for severe pain. Patient not taking: Reported on 04/23/2019 01/27/19   Alla Feeling, NP  prochlorperazine (COMPAZINE) 10 MG  tablet Take 1 tablet (10 mg total) by mouth every 6 (six) hours as needed (Nausea or vomiting). 05/10/18 02/22/19  Truitt Merle, MD    Physical Exam: Vitals:   04/23/19 1400 04/23/19 1445 04/23/19 1530 04/23/19 1745  BP: (!) 170/95 (!) 173/95 (!) 169/98 136/77  Pulse: 79 74 75 81  Resp: (!) 23 18 12 13   Temp:      TempSrc:      SpO2: 97% 94% 100% 99%    Constitutional:Vital Signs as per Above [More than three noted] No Acute Distress Eyes: Pink Conjunctiva and no PtosisPupils Equal and Reactive to light and accommodation ENMT:  External Appearance of Ears and Nose without obvious deformity, masses or scarsExamination of teeth lips and gums:poor dentition Neck:  Trachea Midline, Neck Symmetric Thyroid without tenderness, palpable masses or nodules Respiratory: Respiratory Effort Normal: No Use of Respiratory Muscles,No  Intercostal Retractions Lungs Bilateral Crackles to Auscultation Bilaterally Cardiovascular:  Heart Auscultated: Regular Regular without any added sounds or murmurs Trace Lower Extremity Edema Gastrointestinal: Abdomen soft and nontender without palpable masses, guarding or rebound  No Palpable Splenomegaly or Hepatomegaly Lymphatic: No Palpable Cervical Lymphadenopathy or Palpable No Axillary Lymphadenopathy Neurologic: Examination of Deep Tendon Reflexes B/L LE ++ at knee Examination  of Sensation intact b/l LE Psychiatric: Patient Orientated to Time, Place and Person Patient with appropriate mood and affect Recent and Remote Memory Intact Left upper extremity fistula with thrill and bruit present, port in the right pectoral region noted no s/s of infection  Labs on Admission: I have personally reviewed following labs and imaging studies  CBC: Recent Labs  Lab 04/21/19 1042 04/23/19 1153  WBC 5.2 5.5  NEUTROABS 3.3  --   HGB 9.1* 9.4*  HCT 30.2* 31.8*  MCV 97.1 99.7  PLT 179 250   Basic Metabolic Panel: Recent Labs  Lab 04/21/19 1042 04/23/19 1153  NA  140 140  K 4.8 5.4*  CL 103 104  CO2 30 24  GLUCOSE 88 107*  BUN 11 32*  CREATININE 4.75* 8.19*  CALCIUM 7.5* 7.6*   GFR: Estimated Creatinine Clearance: 5.7 mL/min (A) (by C-G formula based on SCr of 8.19 mg/dL (H)). Liver Function Tests: Recent Labs  Lab 04/21/19 1042  AST 15  ALT <6  ALKPHOS 110  BILITOT 0.4  PROT 6.6  ALBUMIN 2.9*   No results for input(s): LIPASE, AMYLASE in the last 168 hours. No results for input(s): AMMONIA in the last 168 hours. Coagulation Profile: No results for input(s): INR, PROTIME in the last 168 hours. Cardiac Enzymes: No results for input(s): CKTOTAL, CKMB, CKMBINDEX, TROPONINI in the last 168 hours. BNP (last 3 results) No results for input(s): PROBNP in the last 8760 hours. HbA1C: No results for input(s): HGBA1C in the last 72 hours. CBG: No results for input(s): GLUCAP in the last 168 hours. Lipid Profile: No results for input(s): CHOL, HDL, LDLCALC, TRIG, CHOLHDL, LDLDIRECT in the last 72 hours. Thyroid Function Tests: No results for input(s): TSH, T4TOTAL, FREET4, T3FREE, THYROIDAB in the last 72 hours. Anemia Panel: No results for input(s): VITAMINB12, FOLATE, FERRITIN, TIBC, IRON, RETICCTPCT in the last 72 hours. Urine analysis:    Component Value Date/Time   COLORURINE YELLOW 03/14/2017 2330   APPEARANCEUR CLOUDY (A) 03/14/2017 2330   LABSPEC 1.020 03/14/2017 2330   PHURINE 6.0 03/14/2017 2330   GLUCOSEU 100 (A) 03/14/2017 2330   HGBUR MODERATE (A) 03/14/2017 2330   BILIRUBINUR NEGATIVE 03/14/2017 2330   KETONESUR NEGATIVE 03/14/2017 2330   PROTEINUR >300 (A) 03/14/2017 2330   NITRITE NEGATIVE 03/14/2017 2330   LEUKOCYTESUR NEGATIVE 03/14/2017 2330    Radiological Exams on Admission: Dg Chest 2 View  Result Date: 04/23/2019 CLINICAL DATA:  Shortness of breath and cough EXAM: CHEST - 2 VIEW COMPARISON:  Chest CT dated 02/22/2019 and chest radiograph dated 12/17/2018 FINDINGS: The heart remains enlarged. A right  internal jugular central venous port catheter is unchanged. There is a small right pleural effusion with associated mild-to-moderate right basilar atelectasis/airspace disease. There is minimal left basilar atelectasis/airspace disease. There is no left pleural effusion. There is no pneumothorax. Degenerative changes are seen in the spine. IMPRESSION: 1. Small right pleural effusion with associated mild-to-moderate right basilar atelectasis/airspace disease. Aspiration is a consideration. 2. Minimal left basilar atelectasis/airspace disease. 3. Unchanged cardiomegaly. Electronically Signed   By: Zerita Boers M.D.   On: 04/23/2019 12:56    Assessment/Plan Pneumonia, possibly aspiration Rapid Covid was negative full PCR pending to rule out viral pneumonia, location suspect for aspiration although patient denies any dysphagia.  Patient is immunocompromised with no health exposures other than dialysis or history of MRSA infection Plan: Unasyn and azithromycin, Covid PCR to rule out Covid pneumonia despite negative rapid  Acute on chronic diastolic heart failure Last  echocardiogram in 2018 with grade 1 diastolic dysfunction Dependent edema bilateral crackles and missed dialysis Plan: Hemodialysis to obtain euvolemia  Acute respiratory failure Patient dyspneic and desaturates off of nasal cannula now on 3 L nasal cannula Likely secondary to the patient's acute congestive heart failure and pneumonia Plan: Provide supplemental oxygen as required  End-stage renal disease Missed dialysis today, nephrology has been consulted to undertake dialysis  Nonmyocardial infarction troponin elevation Troponin 25 then 26 no chest pain, this is likely secondary to the patient's age renal disease and congestive heart failure  Hyperkalemia Potassium 5.4 patient plan for dialysis today  Anemia Hemoglobin 9.4, likely combination of end-stage renal disease Follow CBC  Hypertension Continue amlodipine 10 mg  daily and metoprolol 12.5 mg  Cholangiocarcinoma Continue Outpatient follow-up  Patient is DO NOT RESUSCITATE as per her self   DVT prophylaxis: Heparin Code Status: DNR Family Communication: Patient  Disposition Plan:Home Consults called: Nephrology Dr. Posey Pronto by ED Admission status: Obs pending oxygenation status post HD  Chriss Driver Khale Nigh MD [facility] Pager [phone]  If 7PM-7AM, please contact night-coverage [email or web address] Password [email or web address]  04/23/2019, 7:06 PM

## 2019-04-24 ENCOUNTER — Observation Stay (HOSPITAL_COMMUNITY): Payer: Medicare Other

## 2019-04-24 DIAGNOSIS — N2581 Secondary hyperparathyroidism of renal origin: Secondary | ICD-10-CM | POA: Diagnosis present

## 2019-04-24 DIAGNOSIS — Z66 Do not resuscitate: Secondary | ICD-10-CM | POA: Diagnosis present

## 2019-04-24 DIAGNOSIS — Y95 Nosocomial condition: Secondary | ICD-10-CM | POA: Diagnosis present

## 2019-04-24 DIAGNOSIS — Z79891 Long term (current) use of opiate analgesic: Secondary | ICD-10-CM | POA: Diagnosis not present

## 2019-04-24 DIAGNOSIS — Z8249 Family history of ischemic heart disease and other diseases of the circulatory system: Secondary | ICD-10-CM | POA: Diagnosis not present

## 2019-04-24 DIAGNOSIS — Z20828 Contact with and (suspected) exposure to other viral communicable diseases: Secondary | ICD-10-CM | POA: Diagnosis present

## 2019-04-24 DIAGNOSIS — I132 Hypertensive heart and chronic kidney disease with heart failure and with stage 5 chronic kidney disease, or end stage renal disease: Secondary | ICD-10-CM | POA: Diagnosis present

## 2019-04-24 DIAGNOSIS — E875 Hyperkalemia: Secondary | ICD-10-CM

## 2019-04-24 DIAGNOSIS — Z79899 Other long term (current) drug therapy: Secondary | ICD-10-CM | POA: Diagnosis not present

## 2019-04-24 DIAGNOSIS — C799 Secondary malignant neoplasm of unspecified site: Secondary | ICD-10-CM | POA: Diagnosis present

## 2019-04-24 DIAGNOSIS — N186 End stage renal disease: Secondary | ICD-10-CM | POA: Diagnosis present

## 2019-04-24 DIAGNOSIS — E8889 Other specified metabolic disorders: Secondary | ICD-10-CM | POA: Diagnosis present

## 2019-04-24 DIAGNOSIS — I5033 Acute on chronic diastolic (congestive) heart failure: Secondary | ICD-10-CM | POA: Diagnosis present

## 2019-04-24 DIAGNOSIS — Z833 Family history of diabetes mellitus: Secondary | ICD-10-CM | POA: Diagnosis not present

## 2019-04-24 DIAGNOSIS — D631 Anemia in chronic kidney disease: Secondary | ICD-10-CM | POA: Diagnosis present

## 2019-04-24 DIAGNOSIS — F17210 Nicotine dependence, cigarettes, uncomplicated: Secondary | ICD-10-CM | POA: Diagnosis present

## 2019-04-24 DIAGNOSIS — Z9115 Patient's noncompliance with renal dialysis: Secondary | ICD-10-CM | POA: Diagnosis not present

## 2019-04-24 DIAGNOSIS — J9601 Acute respiratory failure with hypoxia: Secondary | ICD-10-CM

## 2019-04-24 DIAGNOSIS — Z992 Dependence on renal dialysis: Secondary | ICD-10-CM | POA: Diagnosis not present

## 2019-04-24 DIAGNOSIS — C221 Intrahepatic bile duct carcinoma: Secondary | ICD-10-CM | POA: Diagnosis present

## 2019-04-24 DIAGNOSIS — J189 Pneumonia, unspecified organism: Secondary | ICD-10-CM | POA: Diagnosis not present

## 2019-04-24 DIAGNOSIS — Z9221 Personal history of antineoplastic chemotherapy: Secondary | ICD-10-CM | POA: Diagnosis not present

## 2019-04-24 DIAGNOSIS — J690 Pneumonitis due to inhalation of food and vomit: Secondary | ICD-10-CM | POA: Diagnosis present

## 2019-04-24 LAB — CBC
HCT: 29.1 % — ABNORMAL LOW (ref 36.0–46.0)
Hemoglobin: 8.8 g/dL — ABNORMAL LOW (ref 12.0–15.0)
MCH: 29.5 pg (ref 26.0–34.0)
MCHC: 30.2 g/dL (ref 30.0–36.0)
MCV: 97.7 fL (ref 80.0–100.0)
Platelets: 179 10*3/uL (ref 150–400)
RBC: 2.98 MIL/uL — ABNORMAL LOW (ref 3.87–5.11)
RDW: 19 % — ABNORMAL HIGH (ref 11.5–15.5)
WBC: 4.8 10*3/uL (ref 4.0–10.5)
nRBC: 0 % (ref 0.0–0.2)

## 2019-04-24 LAB — COMPREHENSIVE METABOLIC PANEL
ALT: 10 U/L (ref 0–44)
AST: 15 U/L (ref 15–41)
Albumin: 2.5 g/dL — ABNORMAL LOW (ref 3.5–5.0)
Alkaline Phosphatase: 97 U/L (ref 38–126)
Anion gap: 12 (ref 5–15)
BUN: 10 mg/dL (ref 8–23)
CO2: 28 mmol/L (ref 22–32)
Calcium: 7.7 mg/dL — ABNORMAL LOW (ref 8.9–10.3)
Chloride: 98 mmol/L (ref 98–111)
Creatinine, Ser: 4.19 mg/dL — ABNORMAL HIGH (ref 0.44–1.00)
GFR calc Af Amer: 11 mL/min — ABNORMAL LOW (ref >60)
GFR calc non Af Amer: 10 mL/min — ABNORMAL LOW (ref >60)
Glucose, Bld: 93 mg/dL (ref 70–99)
Potassium: 4 mmol/L (ref 3.5–5.1)
Sodium: 138 mmol/L (ref 135–145)
Total Bilirubin: 0.5 mg/dL (ref 0.3–1.2)
Total Protein: 6.1 g/dL — ABNORMAL LOW (ref 6.5–8.1)

## 2019-04-24 LAB — PHOSPHORUS: Phosphorus: 3 mg/dL (ref 2.5–4.6)

## 2019-04-24 MED ORDER — SODIUM CHLORIDE 0.9 % IV SOLN
INTRAVENOUS | Status: DC | PRN
  Administered 2019-04-24: 500 mL via INTRAVENOUS

## 2019-04-24 MED ORDER — METOPROLOL TARTRATE 25 MG PO TABS
25.0000 mg | ORAL_TABLET | Freq: Two times a day (BID) | ORAL | Status: DC
  Administered 2019-04-24 – 2019-04-25 (×3): 25 mg via ORAL
  Filled 2019-04-24 (×3): qty 1

## 2019-04-24 MED ORDER — ALUM & MAG HYDROXIDE-SIMETH 200-200-20 MG/5ML PO SUSP
30.0000 mL | Freq: Four times a day (QID) | ORAL | Status: DC | PRN
  Administered 2019-04-24: 30 mL via ORAL
  Filled 2019-04-24: qty 30

## 2019-04-24 MED ORDER — PRO-STAT SUGAR FREE PO LIQD
30.0000 mL | Freq: Two times a day (BID) | ORAL | Status: DC
  Administered 2019-04-24 – 2019-04-25 (×3): 30 mL via ORAL
  Filled 2019-04-24 (×3): qty 30

## 2019-04-24 NOTE — Progress Notes (Signed)
[facility] Progress Note   Subjective: Awake, alert, says she feels much better. Still requiring O2. No new complaints.    Objective Vitals:   04/23/19 2300 04/23/19 2328 04/24/19 0011 04/24/19 0600  BP: (!) 195/101 (!) 179/100 (!) 176/84 (!) 170/78  Pulse: 73 73 81 80  Resp:  13 20 18   Temp:  98.6 F (37 C) 100 F (37.8 C) 98.9 F (37.2 C)  TempSrc:  Oral Oral Oral  SpO2:  100% 95% 98%  Weight:  64.9 kg 64 kg   Height:   5' 6"  (1.676 m)    Physical Exam General: Pleasant elderly female in NAD Heart: S1,S2, RRR Lungs: Bilateral breath sounds coarse throughout upper lung fields, slightly decreased in bases R>L. No crackles. No WOB.  Abdomen: Soft, NT Extremities: No LE edema. Dialysis Access: L AVF +T/B  Additional Objective Labs: Basic Metabolic Panel: Recent Labs  Lab 04/21/19 1042 04/23/19 1153 04/23/19 2000 04/24/19 0346  NA 140 140  --  138  K 4.8 5.4*  --  4.0  CL 103 104  --  98  CO2 30 24  --  28  GLUCOSE 88 107*  --  93  BUN 11 32*  --  10  CREATININE 4.75* 8.19* 8.51* 4.19*  CALCIUM 7.5* 7.6*  --  7.7*   Liver Function Tests: Recent Labs  Lab 04/21/19 1042 04/24/19 0346  AST 15 15  ALT <6 10  ALKPHOS 110 97  BILITOT 0.4 0.5  PROT 6.6 6.1*  ALBUMIN 2.9* 2.5*   No results for input(s): LIPASE, AMYLASE in the last 168 hours. CBC: Recent Labs  Lab 04/21/19 1042 04/23/19 1153 04/24/19 0346  WBC 5.2 5.5 4.8  NEUTROABS 3.3  --   --   HGB 9.1* 9.4* 8.8*  HCT 30.2* 31.8* 29.1*  MCV 97.1 99.7 97.7  PLT 179 199 179   Blood Culture    Component Value Date/Time   SDES BLOOD RIGHT HAND 04/23/2019 1600   SPECREQUEST  04/23/2019 1600    AEROBIC BOTTLE ONLY Blood Culture results may not be optimal due to an inadequate volume of blood received in culture bottles   CULT  04/23/2019 1600    NO GROWTH < 24 HOURS Performed at [facility] [address] [place], [place] [address]    REPTSTATUS PENDING 04/23/2019 1600     Cardiac Enzymes: No results for input(s): CKTOTAL, CKMB, CKMBINDEX, TROPONINI in the last 168 hours. CBG: No results for input(s): GLUCAP in the last 168 hours. Iron Studies: No results for input(s): IRON, TIBC, TRANSFERRIN, FERRITIN in the last 72 hours. @lablastinr3 @ Studies/Results: Dg Chest 2 View  Result Date: 04/23/2019 CLINICAL DATA:  Shortness of breath and cough EXAM: CHEST - 2 VIEW COMPARISON:  Chest CT dated 02/22/2019 and chest radiograph dated 12/17/2018 FINDINGS: The heart remains enlarged. A right internal jugular central venous port catheter is unchanged. There is a small right pleural effusion with associated mild-to-moderate right basilar atelectasis/airspace disease. There is minimal left basilar atelectasis/airspace disease. There is no left pleural effusion. There is no pneumothorax. Degenerative changes are seen in the spine. IMPRESSION: 1. Small right pleural effusion with associated mild-to-moderate right basilar atelectasis/airspace disease. Aspiration is a consideration. 2. Minimal left basilar atelectasis/airspace disease. 3. Unchanged cardiomegaly. Electronically Signed   By: Zerita Boers M.D.   On: 04/23/2019 12:56   Medications: . sodium chloride    . sodium chloride    . ampicillin-sulbactam (UNASYN) IV 1.5 g (04/24/19 0234)  .  azithromycin     . amLODipine  10 mg Oral QHS  . Chlorhexidine Gluconate Cloth  6 each Topical Daily  . doxercalciferol  6 mcg Intravenous Q M,W,F-HD  . folic acid  1 mg Oral Daily  . gabapentin  100 mg Oral QHS  . heparin  4,000 Units Dialysis Once in dialysis  . heparin  5,000 Units Subcutaneous Q8H  . metoprolol tartrate  12.5 mg Oral BID  . multivitamin  1 tablet Oral QHS  . pantoprazole  40 mg Oral BID  . polyethylene glycol  17 g Oral Daily  . senna-docusate  2 tablet Oral BID  . sodium chloride flush  3 mL Intravenous Once     Dialysis Orders: [facility] MWF 4 hrs 180NRe 400/800 65 kg 2.0 K/ 2.5 Ca UFP 2 L  AVF -Heparin 40000 units IV initial bolus Heparin 2000 units IV mid run -Hectorol 6 mcg IV TIW -Mircera 225 mcg IV q 2 weeks (Last dose 04/10/2019)  Assessment/Plan: 1. Volume overload in setting of missed HD: resolved with HD. Got close to EDW. Has been leaving sl under EDW as OP. Agrees to try to lower EDW more tomorrow.  2.  HCAP-BC NG X24 hrs. On Unasyn.T-max 100.25F overnight.  WBC 4.8 3.  Intrahepatic cholangiocarcinoma cT2N1MO stage IIIB, unresectable, MSI-stable. Followed by Dr. Burr Medico. 4.  ESRD - MWF via AVF. She is compliant with HD but misses Friday when she has chemo and makes up on Saturday. HD tomorrow on schedule.  5.  Hypertension/volume  - As noted above. HD 12/08 Net UF 3.5 liters Post wt 64.9 kg. Continue amlodipine 10 mg PO Q hs. Also on Metoprolol 25 mg PO BID. Has been started at half this dose. BP still high over night. Increase to OP dose.   6.  Anemia  - She has been receiving ESA as OP. HGB 8.8. Given Aranesp 100 mcg IV 48/88/91.  7.  Metabolic bone disease - Ca 7.6 C Ca 8.5 Last OP Phos at goal. Continue VDRA, no binders on OP med list.  8.  Nutrition - Albumin 2.5Renal diet when able to eat, start prostat, renal vits.  9. GOC-Pt is DNR   Jimmye Norman. Kristyanna Barcelo NP-C 04/24/2019, 8:52 AM  [employer] [phone]

## 2019-04-24 NOTE — Plan of Care (Signed)
  Problem: Nutrition: Goal: Adequate nutrition will be maintained Outcome: Progressing   

## 2019-04-24 NOTE — Progress Notes (Addendum)
New Admission Note:  Arrival Method: By stretcher from hemodialysis around 0040 Mental Orientation: Alert and oriented, very tired Telemetry: Box 6, CCMD notified Assessment: Completed Skin: Completed, refer to flowsheets IV: Right chest porta cath for chemo. Pain: Denies Tubes: None Safety Measures: Safety Fall Prevention Plan was given, discussed and signed. Admission: Completed [facility] Orientation: Patient has been orientated to the room, unit and the staff. Family: None  Orders have been reviewed and implemented. Will continue to monitor the patient. Call light has been placed within reach and bed alarm has been activated.   [name] Mount, RN  Phone Number: [phone]

## 2019-04-24 NOTE — Progress Notes (Signed)
RN called pharmacy due to still not having antibiotic. Messaged when pt arrived. Was told that they would send again.   Eleanora Neighbor, RN

## 2019-04-24 NOTE — Progress Notes (Signed)
Progress Note    Mary Chapman  VEL:[id number] DOB: December 16, 1945  DOA: 04/23/2019 PCP: Patient, No Pcp Per    Brief Narrative:     Medical records reviewed and are as summarized below:  Mary Chapman is an 73 y.o. female with a past medical history of end-stage renal disease HD Monday Tuesday Saturday, metastatic cholangiocarcinoma who presented to the emergency department with 2 days of shortness of breath and cough.  Specifically the patient reports to me she has experienced gradual increase in her shortness of breath and some dependent edema, she denies any Covid exposures loss of smell or taste and reports subjective fever.  She denies any dysphagia diarrhea or vomiting.  Patient reports she presented to her oncologist office to have a pump removed on that point given her bilateral crackles seen by the oncologist on exam was referred to the emergency department.   Assessment/Plan:   Active Problems:   ESRD (end stage renal disease) ([place])   DNR (do not resuscitate)   Pneumonia, possibly aspiration Rapid Covid was negative full PCR negative as well - location suspect for aspiration although patient denies any dysphagia.   -IV abx -flutter valve -repeat chest x ray post HD  Acute on chronic diastolic heart failure Last echocardiogram in 2018 with grade 1 diastolic dysfunction Dependent edema bilateral crackles and missed dialysis Plan: Hemodialysis to obtain euvolemia  Acute respiratory failure due to hypoxia Patient dyspneic and desaturates off of nasal cannula to <88% Likely secondary to the patient's acute volume overload and pneumonia Wean O2 as tolerated  End-stage renal disease Missed dialysis on Friday  -got HD last PM: 3.4L removed  Hyperkalemia Potassium 5.4 patient plan for dialysis today  Anemia of CKD -defer to renal  Hypertension Continue amlodipine 10 mg daily and metoprolol 12.5 mg  Cholangiocarcinoma Continue Outpatient follow-up   Family  Communication/Anticipated D/C date and plan/Code Status   DVT prophylaxis: heparin Code Status: dnr  Family Communication:  Disposition Plan:    Medical Consultants:    renal  Subjective:   Productive cough  Objective:    Vitals:   04/23/19 2300 04/23/19 2328 04/24/19 0011 04/24/19 0600  BP: (!) 195/101 (!) 179/100 (!) 176/84 (!) 170/78  Pulse: 73 73 81 80  Resp:  13 20 18   Temp:  98.6 F (37 C) 100 F (37.8 C) 98.9 F (37.2 C)  TempSrc:  Oral Oral Oral  SpO2:  100% 95% 98%  Weight:  64.9 kg 64 kg   Height:   5\' 6"  (1.676 m)     Intake/Output Summary (Last 24 hours) at 04/24/2019 0856 Last data filed at 04/24/2019 0600 Gross per 24 hour  Intake 61.07 ml  Output 3500 ml  Net -3438.93 ml   Filed Weights   04/23/19 1920 04/23/19 2328 04/24/19 0011  Weight: 68.8 kg 64.9 kg 64 kg    Exam: In bed, wearing O2 and a mask Coarse breath sounds with scattered wheezing on left A+Ox3 Pleasant and cooperative No rashes or lesions seen  Data Reviewed:   I have personally reviewed following labs and imaging studies:  Labs: Labs show the following:   Basic Metabolic Panel: Recent Labs  Lab 04/21/19 1042 04/23/19 1153 04/23/19 2000 04/24/19 0346  NA 140 140  --  138  K 4.8 5.4*  --  4.0  CL 103 104  --  98  CO2 30 24  --  28  GLUCOSE 88 107*  --  93  BUN 11 32*  --  10  CREATININE 4.75* 8.19* 8.51* 4.19*  CALCIUM 7.5* 7.6*  --  7.7*   GFR Estimated Creatinine Clearance: 11.2 mL/min (A) (by C-G formula based on SCr of 4.19 mg/dL (H)). Liver Function Tests: Recent Labs  Lab 04/21/19 1042 04/24/19 0346  AST 15 15  ALT <6 10  ALKPHOS 110 97  BILITOT 0.4 0.5  PROT 6.6 6.1*  ALBUMIN 2.9* 2.5*   No results for input(s): LIPASE, AMYLASE in the last 168 hours. No results for input(s): AMMONIA in the last 168 hours. Coagulation profile No results for input(s): INR, PROTIME in the last 168 hours.  CBC: Recent Labs  Lab 04/21/19 1042 04/23/19 1153  04/24/19 0346  WBC 5.2 5.5 4.8  NEUTROABS 3.3  --   --   HGB 9.1* 9.4* 8.8*  HCT 30.2* 31.8* 29.1*  MCV 97.1 99.7 97.7  PLT 179 199 179   Cardiac Enzymes: No results for input(s): CKTOTAL, CKMB, CKMBINDEX, TROPONINI in the last 168 hours. BNP (last 3 results) No results for input(s): PROBNP in the last 8760 hours. CBG: No results for input(s): GLUCAP in the last 168 hours. D-Dimer: No results for input(s): DDIMER in the last 72 hours. Hgb A1c: No results for input(s): HGBA1C in the last 72 hours. Lipid Profile: No results for input(s): CHOL, HDL, LDLCALC, TRIG, CHOLHDL, LDLDIRECT in the last 72 hours. Thyroid function studies: No results for input(s): TSH, T4TOTAL, T3FREE, THYROIDAB in the last 72 hours.  Invalid input(s): FREET3 Anemia work up: No results for input(s): VITAMINB12, FOLATE, FERRITIN, TIBC, IRON, RETICCTPCT in the last 72 hours. Sepsis Labs: Recent Labs  Lab 04/21/19 1042 04/23/19 1153 04/24/19 0346  WBC 5.2 5.5 4.8    Microbiology Recent Results (from the past 240 hour(s))  SARS CORONAVIRUS 2 (TAT 6-24 HRS) Nasopharyngeal Nasopharyngeal Swab     Status: None   Collection Time: 04/23/19  2:19 PM   Specimen: Nasopharyngeal Swab  Result Value Ref Range Status   SARS Coronavirus 2 NEGATIVE NEGATIVE Final    Comment: (NOTE) SARS-CoV-2 target nucleic acids are NOT DETECTED. The SARS-CoV-2 RNA is generally detectable in upper and lower respiratory specimens during the acute phase of infection. Negative results do not preclude SARS-CoV-2 infection, do not rule out co-infections with other pathogens, and should not be used as the sole basis for treatment or other patient management decisions. Negative results must be combined with clinical observations, patient history, and epidemiological information. The expected result is Negative. Fact Sheet for Patients: SugarRoll.be Fact Sheet for Healthcare  Providers: https://www.woods-mathews.com/ This test is not yet approved or cleared by the [place] FDA and  has been authorized for detection and/or diagnosis of SARS-CoV-2 by FDA under an Emergency Use Authorization (EUA). This EUA will remain  in effect (meaning this test can be used) for the duration of the COVID-19 declaration under Section 56 4(b)(1) of the Act, 21 U.S.C. section 360bbb-3(b)(1), unless the authorization is terminated or revoked sooner. Performed at [facility], [place] [address], [place], [place] [address]   Culture, blood (routine x 2)     Status: None (Preliminary result)   Collection Time: 04/23/19  3:53 PM   Specimen: BLOOD RIGHT HAND  Result Value Ref Range Status   Specimen Description BLOOD RIGHT HAND  Final   Special Requests   Final    AEROBIC BOTTLE ONLY Blood Culture results may not be optimal due to an inadequate volume of blood received in culture bottles   Culture   Final  NO GROWTH < 24 HOURS Performed at [facility] [address], [place], [place] [address]    Report Status PENDING  Incomplete  Culture, blood (routine x 2)     Status: None (Preliminary result)   Collection Time: 04/23/19  4:00 PM   Specimen: BLOOD RIGHT HAND  Result Value Ref Range Status   Specimen Description BLOOD RIGHT HAND  Final   Special Requests   Final    AEROBIC BOTTLE ONLY Blood Culture results may not be optimal due to an inadequate volume of blood received in culture bottles   Culture   Final    NO GROWTH < 24 HOURS Performed at [facility], [place] [address], [place], [place] [address]    Report Status PENDING  Incomplete    Procedures and diagnostic studies:  Dg Chest 2 View  Result Date: 04/23/2019 CLINICAL DATA:  Shortness of breath and cough EXAM: CHEST - 2 VIEW COMPARISON:  Chest CT dated 02/22/2019 and chest radiograph dated 12/17/2018 FINDINGS: The heart remains enlarged. A right internal jugular central venous  port catheter is unchanged. There is a small right pleural effusion with associated mild-to-moderate right basilar atelectasis/airspace disease. There is minimal left basilar atelectasis/airspace disease. There is no left pleural effusion. There is no pneumothorax. Degenerative changes are seen in the spine. IMPRESSION: 1. Small right pleural effusion with associated mild-to-moderate right basilar atelectasis/airspace disease. Aspiration is a consideration. 2. Minimal left basilar atelectasis/airspace disease. 3. Unchanged cardiomegaly. Electronically Signed   By: Zerita Boers M.D.   On: 04/23/2019 12:56    Medications:   . amLODipine  10 mg Oral QHS  . Chlorhexidine Gluconate Cloth  6 each Topical Daily  . doxercalciferol  6 mcg Intravenous Q M,W,F-HD  . folic acid  1 mg Oral Daily  . gabapentin  100 mg Oral QHS  . heparin  4,000 Units Dialysis Once in dialysis  . heparin  5,000 Units Subcutaneous Q8H  . metoprolol tartrate  12.5 mg Oral BID  . multivitamin  1 tablet Oral QHS  . pantoprazole  40 mg Oral BID  . polyethylene glycol  17 g Oral Daily  . senna-docusate  2 tablet Oral BID  . sodium chloride flush  3 mL Intravenous Once   Continuous Infusions: . sodium chloride    . sodium chloride    . ampicillin-sulbactam (UNASYN) IV 1.5 g (04/24/19 0234)  . azithromycin       LOS: 0 days   Geradine Girt  [facility]   How to contact the [facility] Attending or Consulting provider [place] or covering provider during after hours [place], for this patient?  1. Check the care team in [facility] and look for a) attending/consulting [facility] provider listed and b) the [facility] team listed 2. Log into [email or web address] and use [place]'s universal password to access. If you do not have the password, please contact the hospital operator. 3. Locate the [facility] provider you are looking for under [facility] and page to a number that you can be directly reached. 4. If you still have difficulty reaching the  provider, please page the [facility] (Director on Call) for the Hospitalists listed on amion for assistance.  04/24/2019, 8:56 AM

## 2019-04-25 ENCOUNTER — Telehealth: Payer: Self-pay | Admitting: Hematology

## 2019-04-25 DIAGNOSIS — Z66 Do not resuscitate: Secondary | ICD-10-CM | POA: Diagnosis not present

## 2019-04-25 DIAGNOSIS — E875 Hyperkalemia: Secondary | ICD-10-CM | POA: Diagnosis not present

## 2019-04-25 DIAGNOSIS — J189 Pneumonia, unspecified organism: Secondary | ICD-10-CM

## 2019-04-25 LAB — CBC
HCT: 28.8 % — ABNORMAL LOW (ref 36.0–46.0)
Hemoglobin: 8.7 g/dL — ABNORMAL LOW (ref 12.0–15.0)
MCH: 29.2 pg (ref 26.0–34.0)
MCHC: 30.2 g/dL (ref 30.0–36.0)
MCV: 96.6 fL (ref 80.0–100.0)
Platelets: 182 10*3/uL (ref 150–400)
RBC: 2.98 MIL/uL — ABNORMAL LOW (ref 3.87–5.11)
RDW: 18.7 % — ABNORMAL HIGH (ref 11.5–15.5)
WBC: 4 10*3/uL (ref 4.0–10.5)
nRBC: 0 % (ref 0.0–0.2)

## 2019-04-25 LAB — RENAL FUNCTION PANEL
Albumin: 2.3 g/dL — ABNORMAL LOW (ref 3.5–5.0)
Anion gap: 11 (ref 5–15)
BUN: 33 mg/dL — ABNORMAL HIGH (ref 8–23)
CO2: 27 mmol/L (ref 22–32)
Calcium: 7 mg/dL — ABNORMAL LOW (ref 8.9–10.3)
Chloride: 101 mmol/L (ref 98–111)
Creatinine, Ser: 7.25 mg/dL — ABNORMAL HIGH (ref 0.44–1.00)
GFR calc Af Amer: 6 mL/min — ABNORMAL LOW (ref >60)
GFR calc non Af Amer: 5 mL/min — ABNORMAL LOW (ref >60)
Glucose, Bld: 83 mg/dL (ref 70–99)
Phosphorus: 4.7 mg/dL — ABNORMAL HIGH (ref 2.5–4.6)
Potassium: 4.4 mmol/L (ref 3.5–5.1)
Sodium: 139 mmol/L (ref 135–145)

## 2019-04-25 MED ORDER — GABAPENTIN 100 MG PO CAPS: 100.0000 mg | ORAL_CAPSULE | Freq: Every evening | ORAL | Status: AC | PRN

## 2019-04-25 MED ORDER — HEPARIN SODIUM (PORCINE) 1000 UNIT/ML IJ SOLN
INTRAMUSCULAR | Status: AC
  Administered 2019-04-25: 2000 [IU] via INTRAVENOUS_CENTRAL
  Filled 2019-04-25: qty 1

## 2019-04-25 MED ORDER — HEPARIN SODIUM (PORCINE) 1000 UNIT/ML DIALYSIS
1000.0000 [IU] | INTRAMUSCULAR | Status: DC | PRN
  Administered 2019-04-25: 09:00:00 2000 [IU] via INTRAVENOUS_CENTRAL

## 2019-04-25 MED ORDER — RENA-VITE PO TABS: 1.0000 | ORAL_TABLET | ORAL | Status: AC

## 2019-04-25 MED ORDER — HEPARIN SODIUM (PORCINE) 1000 UNIT/ML DIALYSIS
4000.0000 [IU] | Freq: Once | INTRAMUSCULAR | Status: AC
  Administered 2019-04-25: 4000 [IU] via INTRAVENOUS_CENTRAL

## 2019-04-25 MED ORDER — DOXYCYCLINE HYCLATE 100 MG PO TABS: 100.0000 mg | ORAL_TABLET | Freq: Two times a day (BID) | ORAL | 0 refills | Status: DC

## 2019-04-25 MED ORDER — DOXYCYCLINE HYCLATE 100 MG PO TABS
100.0000 mg | ORAL_TABLET | Freq: Two times a day (BID) | ORAL | Status: DC
  Administered 2019-04-25: 100 mg via ORAL
  Filled 2019-04-25: qty 1

## 2019-04-25 MED ORDER — HEPARIN SOD (PORK) LOCK FLUSH 100 UNIT/ML IV SOLN
500.0000 [IU] | INTRAVENOUS | Status: AC | PRN
  Administered 2019-04-25: 500 [IU]
  Filled 2019-04-25: qty 5

## 2019-04-25 MED ORDER — SODIUM CHLORIDE 0.9 % IV SOLN: 100.0000 mL | INTRAVENOUS | Status: DC | PRN

## 2019-04-25 MED ORDER — DOXERCALCIFEROL 4 MCG/2ML IV SOLN
INTRAVENOUS | Status: AC
  Administered 2019-04-25: 6 ug via INTRAVENOUS
  Filled 2019-04-25: qty 4

## 2019-04-25 MED ORDER — HEPARIN SODIUM (PORCINE) 1000 UNIT/ML IJ SOLN
INTRAMUSCULAR | Status: AC
  Filled 2019-04-25: qty 4

## 2019-04-25 MED ORDER — ALTEPLASE 2 MG IJ SOLR: 2.0000 mg | Freq: Once | INTRAMUSCULAR | Status: DC | PRN

## 2019-04-25 NOTE — Plan of Care (Signed)
  Problem: Education: Goal: Knowledge of General Education information will improve Description: Including pain rating scale, medication(s)/side effects and non-pharmacologic comfort measures Outcome: Adequate for Discharge   Problem: Health Behavior/Discharge Planning: Goal: Ability to manage health-related needs will improve Outcome: Adequate for Discharge   Problem: Clinical Measurements: Goal: Ability to maintain clinical measurements within normal limits will improve Outcome: Adequate for Discharge Goal: Will remain free from infection Outcome: Adequate for Discharge Goal: Diagnostic test results will improve Outcome: Adequate for Discharge Goal: Respiratory complications will improve 04/25/2019 1407 by Dolores Hoose, RN Outcome: Adequate for Discharge 04/25/2019 1236 by Dolores Hoose, RN Outcome: Progressing Goal: Cardiovascular complication will be avoided Outcome: Adequate for Discharge

## 2019-04-25 NOTE — Progress Notes (Addendum)
[facility] Progress Note   Subjective:  Seen on HD - 2L net UF and tolerating. She reports breathing improved - feeling better.  Still coughing.  Objective Vitals:   04/25/19 0730 04/25/19 0800 04/25/19 0830 04/25/19 0900  BP: (!) 149/85 (!) 153/87 (!) 155/87 (!) 159/95  Pulse: 78 78 80 81  Resp:      Temp:      TempSrc:      SpO2:      Weight:      Height:       Physical Exam General: Well appearing woman, NAD Heart: RRR; no murmur Lungs: Rhonchi throughout all lung fields. Abdomen: soft, non-tender Extremities: No LE edema Dialysis Access: L AVF + bruit  Additional Objective Labs: Basic Metabolic Panel: Recent Labs  Lab 04/23/19 1153 04/23/19 2000 04/24/19 0346 04/25/19 0728  NA 140  --  138 139  K 5.4*  --  4.0 4.4  CL 104  --  98 101  CO2 24  --  28 27  GLUCOSE 107*  --  93 83  BUN 32*  --  10 33*  CREATININE 8.19* 8.51* 4.19* 7.25*  CALCIUM 7.6*  --  7.7* 7.0*  PHOS  --   --  3.0 4.7*   Liver Function Tests: Recent Labs  Lab 04/21/19 1042 04/24/19 0346 04/25/19 0728  AST 15 15  --   ALT <6 10  --   ALKPHOS 110 97  --   BILITOT 0.4 0.5  --   PROT 6.6 6.1*  --   ALBUMIN 2.9* 2.5* 2.3*   CBC: Recent Labs  Lab 04/21/19 1042 04/23/19 1153 04/24/19 0346 04/25/19 0729  WBC 5.2 5.5 4.8 4.0  NEUTROABS 3.3  --   --   --   HGB 9.1* 9.4* 8.8* 8.7*  HCT 30.2* 31.8* 29.1* 28.8*  MCV 97.1 99.7 97.7 96.6  PLT 179 199 179 182   Blood Culture    Component Value Date/Time   SDES BLOOD RIGHT HAND 04/23/2019 1600   SPECREQUEST  04/23/2019 1600    AEROBIC BOTTLE ONLY Blood Culture results may not be optimal due to an inadequate volume of blood received in culture bottles   CULT  04/23/2019 1600    NO GROWTH < 24 HOURS Performed at [facility] [address], [place], [place] [address]    REPTSTATUS PENDING 04/23/2019 1600   Studies/Results: Dg Chest 2 View  Result Date: 04/24/2019 CLINICAL DATA:  Acute respiratory failure.  EXAM: CHEST - 2 VIEW COMPARISON:  04/23/2019 FINDINGS: Stable moderate cardiomegaly. Right-sided power port remains in appropriate position. Both lungs are clear. There has been resolution of pulmonary interstitial infiltrates and mild right lung airspace disease since prior study. IMPRESSION: Stable cardiomegaly. No active lung disease. Electronically Signed   By: Marlaine Hind M.D.   On: 04/24/2019 09:47   Dg Chest 2 View  Result Date: 04/23/2019 CLINICAL DATA:  Shortness of breath and cough EXAM: CHEST - 2 VIEW COMPARISON:  Chest CT dated 02/22/2019 and chest radiograph dated 12/17/2018 FINDINGS: The heart remains enlarged. A right internal jugular central venous port catheter is unchanged. There is a small right pleural effusion with associated mild-to-moderate right basilar atelectasis/airspace disease. There is minimal left basilar atelectasis/airspace disease. There is no left pleural effusion. There is no pneumothorax. Degenerative changes are seen in the spine. IMPRESSION: 1. Small right pleural effusion with associated mild-to-moderate right basilar atelectasis/airspace disease. Aspiration is a consideration. 2. Minimal left basilar atelectasis/airspace disease. 3.  Unchanged cardiomegaly. Electronically Signed   By: Zerita Boers M.D.   On: 04/23/2019 12:56   Medications: . sodium chloride    . sodium chloride    . sodium chloride    . sodium chloride    . sodium chloride 500 mL (04/24/19 2015)  . ampicillin-sulbactam (UNASYN) IV Stopped (04/24/19 2202)  . azithromycin 500 mg (04/24/19 0941)   . amLODipine  10 mg Oral QHS  . Chlorhexidine Gluconate Cloth  6 each Topical Daily  . doxercalciferol  6 mcg Intravenous Q M,W,F-HD  . feeding supplement (PRO-STAT SUGAR FREE 64)  30 mL Oral BID  . folic acid  1 mg Oral Daily  . gabapentin  100 mg Oral QHS  . heparin      . heparin  5,000 Units Subcutaneous Q8H  . metoprolol tartrate  25 mg Oral BID  . multivitamin  1 tablet Oral QHS  .  pantoprazole  40 mg Oral BID  . polyethylene glycol  17 g Oral Daily  . senna-docusate  2 tablet Oral BID  . sodium chloride flush  3 mL Intravenous Once    Dialysis Orders: [facility] MWF 4 hrs  400/800   65kg 2.0 K/ 2.5 Ca UFP 2  L AVF Heparin 4000+ 2000u mid run - Hectorol 6 mcg IV TIW - Mircera 225 mcg IV q 2 weeks (Last dose 04/10/2019)  Assessment/Plan: 1. Volume overload in setting of missed HD: Resolved now - possibly has lost weight, challenging EDW down as tolerated. 2. ?HCAP: BCx and COVID negative. + hypoxia. On Unasyn. 3. Intrahepatic cholangiocarcinoma cT2N1MO stage IIIB, unresectable, MSI-stable. Followed by Dr. Burr Medico. 4. ESRD: Continue HD on MWF sched here. HD today. 5. Hypertension/volume: BP high - continue home meds and UF as tolerated. 6. Anemia: Hgb 8.7 - given Aranesp 100 mcg on 29/47/65. 7. Metabolic bone disease: CorrCa/Phos at goal. Continue VDRA, no binders on OP med list. 8. Nutrition: Alb low - continue supplements. 9.   DNR  Veneta Penton, PA-C 04/25/2019, 9:35 AM  [facility] Pager: [phone]  Pt seen, examined and agree w A/P as above.  [name]  MD 04/25/2019, 10:11 AM

## 2019-04-25 NOTE — Plan of Care (Signed)
  Problem: Activity: Goal: Risk for activity intolerance will decrease Outcome: Progressing   

## 2019-04-25 NOTE — Progress Notes (Signed)
SATURATION QUALIFICATIONS: (This note is used to comply with regulatory documentation for home oxygen)  Patient Saturations on Room Air at Rest = 94%  Patient Saturations on Room Air while Ambulating = 95%  Patient Saturations on 1 Liters of oxygen while Ambulating = 93%  Please briefly explain why patient needs home oxygen:   Patient was doing good without oxygen and even by ambulating. Feels no shortness of breath at resting and ambulation.

## 2019-04-25 NOTE — Discharge Summary (Signed)
Physician Discharge Summary  [name] EXB:[id number] DOB: [birth year] DOA: 04/23/2019  PCP: Patient, No Pcp Per  Admit date: 04/23/2019 Discharge date: 04/25/2019  Admitted From: home Discharge disposition: home   Recommendations for Outpatient Follow-Up:   1.    Discharge Diagnosis:   Active Problems:   Cholangiocarcinoma ([place])   ESRD (end stage renal disease) ([place])   DNR (do not resuscitate)   Pneumonia    Discharge Condition: Improved.  Diet recommendation: Low sodium, heart healthy.  Carbohydrate-modified.  Regular.  Wound care: None.  Code status: Full.   History of Present Illness:   [name] is a 73 year old female with a past medical history of end-stage renal disease HD Monday Tuesday Saturday, metastatic cholangiocarcinoma who presented to the emergency department with 2 days of shortness of breath and cough.  Specifically the patient reports to me she has experienced gradual increase in her shortness of breath and some dependent edema, she denies any Covid exposures loss of smell or taste and reports subjective fever.  She denies any dysphagia diarrhea or vomiting.  Patient reports she presented to her oncologist office to have a pump removed on that point given her bilateral crackles seen by the oncologist on exam was referred to the emergency department.  In the ED rapid Covid testing was negative and PCR-based testing is pending.  Past medical records reviewed and summarized: Physician phone encounter from earlier today reporting the patient was coughing with shortness of breath and is immunocompromised with metastatic cholangiocarcinoma. Independent review and interpretation of the patient's ECG which is sinus rhythm with a ventricular rate of 83 QTC 462 Independent review and interpretation of the patient's chest x-ray image with right lower lobe infiltrate   Hospital Course by Problem:   Pneumonia, possibly aspiration Rapid Covid was negative  full PCR negative as well - location suspect for aspiration although patient denies any dysphagia.  -IV abx-- change to PO -flutter valve -repeat chest x ray post HD shows clearing  Acute on chronic diastolic heart failure Last echocardiogram in 2018 with grade 1 diastolic dysfunction Dependent edema bilateral crackles and missed dialysis Plan: Hemodialysis to obtain euvolemia  Acute respiratory failure due to hypoxia Patient dyspneic and desaturates off of nasal cannula to <88% Likely secondary to the patient's acute volume overload and pneumonia Wean O2 as tolerated  End-stage renal disease Missed dialysis on Friday  -got HD last PM: 3.4L removed  Hyperkalemia Potassium 5.4 patient plan for dialysis today  Anemia of CKD -defer to renal  Hypertension Continue amlodipine 10 mg daily and metoprolol 12.5 mg  Cholangiocarcinoma Continue Outpatient follow-up    Medical Consultants:      Discharge Exam:   Vitals:   04/25/19 1100 04/25/19 1150  BP: (!) 168/90 (!) 157/81  Pulse: 77 83  Resp:    Temp:  99.5 F (37.5 C)  SpO2:  93%   Vitals:   04/25/19 1000 04/25/19 1030 04/25/19 1100 04/25/19 1150  BP: (!) 172/92 (!) 159/92 (!) 168/90 (!) 157/81  Pulse: 79 80 77 83  Resp:      Temp:    99.5 F (37.5 C)  TempSrc:    Oral  SpO2:    93%  Weight:      Height:        General exam: Appears calm and comfortable.   The results of significant diagnostics from this hospitalization (including imaging, microbiology, ancillary and laboratory) are listed below for reference.     Procedures and Diagnostic  Studies:   Dg Chest 2 View  Result Date: 04/24/2019 CLINICAL DATA:  Acute respiratory failure. EXAM: CHEST - 2 VIEW COMPARISON:  04/23/2019 FINDINGS: Stable moderate cardiomegaly. Right-sided power port remains in appropriate position. Both lungs are clear. There has been resolution of pulmonary interstitial infiltrates and mild right lung airspace disease  since prior study. IMPRESSION: Stable cardiomegaly. No active lung disease. Electronically Signed   By: Marlaine Hind M.D.   On: 04/24/2019 09:47   Dg Chest 2 View  Result Date: 04/23/2019 CLINICAL DATA:  Shortness of breath and cough EXAM: CHEST - 2 VIEW COMPARISON:  Chest CT dated 02/22/2019 and chest radiograph dated 12/17/2018 FINDINGS: The heart remains enlarged. A right internal jugular central venous port catheter is unchanged. There is a small right pleural effusion with associated mild-to-moderate right basilar atelectasis/airspace disease. There is minimal left basilar atelectasis/airspace disease. There is no left pleural effusion. There is no pneumothorax. Degenerative changes are seen in the spine. IMPRESSION: 1. Small right pleural effusion with associated mild-to-moderate right basilar atelectasis/airspace disease. Aspiration is a consideration. 2. Minimal left basilar atelectasis/airspace disease. 3. Unchanged cardiomegaly. Electronically Signed   By: Zerita Boers M.D.   On: 04/23/2019 12:56     Labs:   Basic Metabolic Panel: Recent Labs  Lab 04/21/19 1042 04/23/19 1153 04/23/19 2000 04/24/19 0346 04/25/19 0728  NA 140 140  --  138 139  K 4.8 5.4*  --  4.0 4.4  CL 103 104  --  98 101  CO2 30 24  --  28 27  GLUCOSE 88 107*  --  93 83  BUN 11 32*  --  10 33*  CREATININE 4.75* 8.19* 8.51* 4.19* 7.25*  CALCIUM 7.5* 7.6*  --  7.7* 7.0*  PHOS  --   --   --  3.0 4.7*   GFR Estimated Creatinine Clearance: 6.5 mL/min (A) (by C-G formula based on SCr of 7.25 mg/dL (H)). Liver Function Tests: Recent Labs  Lab 04/21/19 1042 04/24/19 0346 04/25/19 0728  AST 15 15  --   ALT <6 10  --   ALKPHOS 110 97  --   BILITOT 0.4 0.5  --   PROT 6.6 6.1*  --   ALBUMIN 2.9* 2.5* 2.3*   No results for input(s): LIPASE, AMYLASE in the last 168 hours. No results for input(s): AMMONIA in the last 168 hours. Coagulation profile No results for input(s): INR, PROTIME in the last 168  hours.  CBC: Recent Labs  Lab 04/21/19 1042 04/23/19 1153 04/24/19 0346 04/25/19 0729  WBC 5.2 5.5 4.8 4.0  NEUTROABS 3.3  --   --   --   HGB 9.1* 9.4* 8.8* 8.7*  HCT 30.2* 31.8* 29.1* 28.8*  MCV 97.1 99.7 97.7 96.6  PLT 179 199 179 182   Cardiac Enzymes: No results for input(s): CKTOTAL, CKMB, CKMBINDEX, TROPONINI in the last 168 hours. BNP: Invalid input(s): POCBNP CBG: No results for input(s): GLUCAP in the last 168 hours. D-Dimer No results for input(s): DDIMER in the last 72 hours. Hgb A1c No results for input(s): HGBA1C in the last 72 hours. Lipid Profile No results for input(s): CHOL, HDL, LDLCALC, TRIG, CHOLHDL, LDLDIRECT in the last 72 hours. Thyroid function studies No results for input(s): TSH, T4TOTAL, T3FREE, THYROIDAB in the last 72 hours.  Invalid input(s): FREET3 Anemia work up No results for input(s): VITAMINB12, FOLATE, FERRITIN, TIBC, IRON, RETICCTPCT in the last 72 hours. Microbiology Recent Results (from the past 240 hour(s))  SARS CORONAVIRUS 2 (  TAT 6-24 HRS) Nasopharyngeal Nasopharyngeal Swab     Status: None   Collection Time: 04/23/19  2:19 PM   Specimen: Nasopharyngeal Swab  Result Value Ref Range Status   SARS Coronavirus 2 NEGATIVE NEGATIVE Final    Comment: (NOTE) SARS-CoV-2 target nucleic acids are NOT DETECTED. The SARS-CoV-2 RNA is generally detectable in upper and lower respiratory specimens during the acute phase of infection. Negative results do not preclude SARS-CoV-2 infection, do not rule out co-infections with other pathogens, and should not be used as the sole basis for treatment or other patient management decisions. Negative results must be combined with clinical observations, patient history, and epidemiological information. The expected result is Negative. Fact Sheet for Patients: SugarRoll.be Fact Sheet for Healthcare Providers: https://www.woods-mathews.com/ This test is not  yet approved or cleared by the [place] FDA and  has been authorized for detection and/or diagnosis of SARS-CoV-2 by FDA under an Emergency Use Authorization (EUA). This EUA will remain  in effect (meaning this test can be used) for the duration of the COVID-19 declaration under Section 56 4(b)(1) of the Act, 21 U.S.C. section 360bbb-3(b)(1), unless the authorization is terminated or revoked sooner. Performed at [facility], [place] [address], [place], [place] [address]   Culture, blood (routine x 2)     Status: None (Preliminary result)   Collection Time: 04/23/19  3:53 PM   Specimen: BLOOD RIGHT HAND  Result Value Ref Range Status   Specimen Description BLOOD RIGHT HAND  Final   Special Requests   Final    AEROBIC BOTTLE ONLY Blood Culture results may not be optimal due to an inadequate volume of blood received in culture bottles   Culture   Final    NO GROWTH < 24 HOURS Performed at [facility], [place] [address], [place], [place] [address]    Report Status PENDING  Incomplete  Culture, blood (routine x 2)     Status: None (Preliminary result)   Collection Time: 04/23/19  4:00 PM   Specimen: BLOOD RIGHT HAND  Result Value Ref Range Status   Specimen Description BLOOD RIGHT HAND  Final   Special Requests   Final    AEROBIC BOTTLE ONLY Blood Culture results may not be optimal due to an inadequate volume of blood received in culture bottles   Culture   Final    NO GROWTH < 24 HOURS Performed at [facility], [place] [address], [place],  [address]    Report Status PENDING  Incomplete     Discharge Instructions:   Discharge Instructions    Discharge instructions   Complete by: As directed    Can use mucinex to loosen sputum if needed   Increase activity slowly   Complete by: As directed      Allergies as of 04/25/2019   No Known Allergies     Medication List    STOP taking these medications   oxyCODONE 5 MG immediate release tablet Commonly  known as: Oxy IR/ROXICODONE     TAKE these medications   amLODipine 10 MG tablet Commonly known as: NORVASC Take 10 mg by mouth at bedtime.   doxycycline 100 MG tablet Commonly known as: VIBRA-TABS Take 1 tablet (100 mg total) by mouth every 12 (twelve) hours.   feeding supplement (NEPRO CARB STEADY) Liqd Take 237 mLs by mouth 3 (three) times daily as needed (Supplement).   folic acid 1 MG tablet Commonly known as: FOLVITE Take 1 tablet (1 mg total)  by mouth daily.   gabapentin 100 MG capsule Commonly known as: NEURONTIN Take 1 capsule (100 mg total) by mouth at bedtime as needed (pain).   metoprolol tartrate 25 MG tablet Commonly known as: LOPRESSOR Take 0.5 tablets (12.5 mg total) by mouth 2 (two) times daily.   multivitamin Tabs tablet Take 1 tablet by mouth 3 (three) times a week.   pantoprazole 40 MG tablet Commonly known as: PROTONIX TAKE 1 TABLET BY MOUTH TWICE A DAY   polyethylene glycol 17 g packet Commonly known as: MiraLax Take 17 g by mouth daily. What changed:   when to take this  reasons to take this   senna-docusate 8.6-50 MG tablet Commonly known as: Senokot-S Take 2 tablets by mouth 2 (two) times daily. What changed:   when to take this  reasons to take this      Follow-up Information    [name], MD Follow up in 1 week(s).   Specialty: Cardiology Contact information: [address] [place] [address] [phone]            Time coordinating discharge: 35 min  Signed:  Geradine Girt DO  [facility] 04/25/2019, 3:30 PM

## 2019-04-25 NOTE — Progress Notes (Signed)
HEMATOLOGY-ONCOLOGY PROGRESS NOTE  SUBJECTIVE: Mary Chapman presented to the emergency room with shortness of breath and cough.  Chest x-ray showed right lower lobe infiltrate.  Has been receiving IV antibiotics.  Patient reports that she is feeling much better today.  Still has a congested cough.  Shortness of breath has improved.  She is planning to discharge home later today.  She will not need oxygen upon discharge.  Oncology History  Intrahepatic cholangiocarcinoma ([place])  02/23/2018 Imaging   CT AP W Contrast 02/23/18  IMPRESSION: 1. Large multi-cystic lesion in the central aspect of the liver adjacent to the gallbladder fossa, with several smaller satellite lesions. These are all new compared to prior CT the chest, abdomen and pelvis 08/07/2016, concerning for intrahepatic abscesses. 2. Extensive mural thickening and inflammatory changes in the region of the cecum. This is nonspecific, and could reflect either right-sided diverticulitis, focal area of colitis, or potentially even underlying neoplasm. 3. Cholelithiasis. Gallbladder is nearly completely contracted without surrounding inflammatory changes to suggest an acute cholecystitis at this time. 4. Left-sided nephrolithiasis measuring up to 8 mm in the upper pole collecting system of left kidney. No ureteral stones or findings of urinary tract obstruction. 5. Aortic atherosclerosis.   02/27/2018 Initial Biopsy   Diagnosis 02/27/18  Liver, needle/core biopsy, Right Hepatic Lobe - ADENOCARCINOMA. SEE NOTE.   02/27/2018 Miscellaneous     03/05/2018 Procedure   Colonoscopy 03/05/18  IMPRESSION - Preparation of the colon was fair. - Non-thrombosed external hemorrhoids found on digital rectal exam. - There was significant looping of the colon. - Severe diverticulosis in the recto-sigmoid colon, in the sigmoid colon, in the descending colon, in the transverse colon, at the hepatic flexure, in the ascending colon and in  the cecum. There was no evidence of diverticular bleeding. - A single (solitary) ulcer in the cecum - highly concerning for underlying malignancy. Biopsied. Phlegmonous change is possible, though often in setting of complicated diverticulosis/diverticulitis ulceration would not be as common. - Erythematous mucosa in the transverse colon, at the hepatic flexure, in the ascending colon and in the cecum. Biopsied. - Normal mucosa in the rectum, in the recto-sigmoid colon, in the sigmoid colon and in the descending colon. Biopsied. - Non-bleeding non-thrombosed external and internal hemorrhoids. -----Negative for malignancy    03/11/2018 Initial Diagnosis   Intrahepatic cholangiocarcinoma ([place])   04/02/2018 Imaging   CT CAP W contrast 04/02/18  IMPRESSION: 1. 4 mm subpleural nodule in the periphery of the right lower lobe. This is nonspecific, but strongly favored to represent a benign subpleural lymph node. Attention on follow-up studies is recommended to exclude the possibility of metastatic disease. 2. Diffuse bronchial wall thickening with moderate centrilobular and mild paraseptal emphysema; imaging findings suggestive of underlying COPD. 3. Aortic atherosclerosis, in addition to left main and left anterior descending coronary artery disease. Assessment for potential risk factor modification, dietary therapy or pharmacologic therapy may be warranted, if clinically indicated. 4. Nonobstructive calculi in the upper pole collecting system of left kidney measuring up to 7 mm.    04/27/2018 Cancer Staging   Staging form: Intrahepatic Bile Duct, AJCC 8th Edition - Clinical: Stage IIIB (cT2, cN1, cM0) - Signed by Truitt Merle, MD on 04/27/2018   05/18/2018 - 02/10/2019 Chemotherapy   Gemcitabine and Cisplatin every 2 weeks starting on 05/18/2018, with 50% dose reduction. D/c after 02/10/19 due to disease progression.     07/19/2018 Imaging   CT CAP W CONTRAST  IMPRESSION: 1. Dominant  inferior liver mass is decreased  in size. Two subcentimeter clustered low-attenuation lesions in the far inferior right liver lobe, not discretely visualized on the prior noncontrast CT study, can not exclude new small satellite hepatic metastases. 2. Porta hepatis adenopathy is mildly decreased. 3. Small right pulmonary nodules are stable. 4. No additional potential new or progressive metastatic disease. 5. Small pericardial effusion, slightly increased. 6. Cholelithiasis. 7. Moderate diffuse colonic diverticulosis. 8. Aortic Atherosclerosis (ICD10-I70.0) and Emphysema (ICD10-J43.9).    11/02/2018 Imaging   CT CAP WO contrast  IMPRESSION: 1. The dominant right hepatic lobe mass and most of the smaller right hepatic lobe lesions are stable. One of the right hepatic lobe lesions appears to have increased in size, formerly about 1.1 by 1.2 cm and currently 1.9 by 1.5 cm. 2. Two small right lower lobe pulmonary nodules are stable in size. 3. A hypodense right thyroid nodule has enlarged, currently 1.9 by 1.2 cm and formerly 1.1 by 0.7 cm. Consider thyroid ultrasound for further evaluation. 4. Other imaging findings of potential clinical significance: Aortic Atherosclerosis (ICD10-I70.0) and Emphysema (ICD10-J43.9). Coronary atherosclerosis. Mild cardiomegaly. Low-density blood pool suggests anemia. Nonobstructive left nephrolithiasis. Colonic diverticulosis.   02/22/2019 Imaging   CT CAP WO Contrast  IMPRESSION: 1. Interval increase in size of 2 now dominant hepatic lesions consistent with disease progression. Assessment of liver parenchyma limited by noncontrast study. 2. Stable right lower lobe pulmonary nodule. 2nd pulmonary nodule seen previously not identified today. 3. Stable 20 mm right thyroid nodule. 4.  Emphysema. (ICD10-J43.9) 5.  Aortic Atherosclerois (ICD10-170.0)   02/24/2019 -  Chemotherapy   Second-line FOLFOX  with dose reduction starting 02/24/19       REVIEW  OF SYSTEMS:   As noted in the HPI.  I have reviewed the past medical history, past surgical history, social history and family history with the patient and they are unchanged from previous note.   PHYSICAL EXAMINATION: ECOG PERFORMANCE STATUS: 2 - Symptomatic, <50% confined to bed  Vitals:   04/25/19 1100 04/25/19 1150  BP: (!) 168/90 (!) 157/81  Pulse: 77 83  Resp:    Temp:  99.5 F (37.5 C)  SpO2:  93%   Filed Weights   04/23/19 2328 04/24/19 0011 04/25/19 0700  Weight: 64.9 kg 64 kg 66 kg    Intake/Output from previous day: 12/06 0701 - 12/07 0700 In: 1146.4 [P.O.:600; I.V.:97.5; IV Piggyback:448.9] Out: 0   GENERAL:alert, no distress and comfortable LUNGS: Coarse breath sounds in the posterior lung fields with scattered wheezing on the left.   HEART: regular rate & rhythm and no murmurs and no lower extremity edema ABDOMEN:abdomen soft, non-tender and normal bowel sounds Musculoskeletal:no cyanosis of digits and no clubbing  NEURO: alert & oriented x 3 with fluent speech, no focal motor/sensory deficits  LABORATORY DATA:  I have reviewed the data as listed CMP Latest Ref Rng & Units 04/25/2019 04/24/2019 04/23/2019  Glucose 70 - 99 mg/dL 83 93 -  BUN 8 - 23 mg/dL 33(H) 10 -  Creatinine 0.44 - 1.00 mg/dL 7.25(H) 4.19(H) 8.51(H)  Sodium 135 - 145 mmol/L 139 138 -  Potassium 3.5 - 5.1 mmol/L 4.4 4.0 -  Chloride 98 - 111 mmol/L 101 98 -  CO2 22 - 32 mmol/L 27 28 -  Calcium 8.9 - 10.3 mg/dL 7.0(L) 7.7(L) -  Total Protein 6.5 - 8.1 g/dL - 6.1(L) -  Total Bilirubin 0.3 - 1.2 mg/dL - 0.5 -  Alkaline Phos 38 - 126 U/L - 97 -  AST 15 - 41 U/L -  15 -  ALT 0 - 44 U/L - 10 -    Lab Results  Component Value Date   WBC 4.0 04/25/2019   HGB 8.7 (L) 04/25/2019   HCT 28.8 (L) 04/25/2019   MCV 96.6 04/25/2019   PLT 182 04/25/2019   NEUTROABS 3.3 04/21/2019    Dg Chest 2 View  Result Date: 04/24/2019 CLINICAL DATA:  Acute respiratory failure. EXAM: CHEST - 2 VIEW  COMPARISON:  04/23/2019 FINDINGS: Stable moderate cardiomegaly. Right-sided power port remains in appropriate position. Both lungs are clear. There has been resolution of pulmonary interstitial infiltrates and mild right lung airspace disease since prior study. IMPRESSION: Stable cardiomegaly. No active lung disease. Electronically Signed   By: Marlaine Hind M.D.   On: 04/24/2019 09:47   Dg Chest 2 View  Result Date: 04/23/2019 CLINICAL DATA:  Shortness of breath and cough EXAM: CHEST - 2 VIEW COMPARISON:  Chest CT dated 02/22/2019 and chest radiograph dated 12/17/2018 FINDINGS: The heart remains enlarged. A right internal jugular central venous port catheter is unchanged. There is a small right pleural effusion with associated mild-to-moderate right basilar atelectasis/airspace disease. There is minimal left basilar atelectasis/airspace disease. There is no left pleural effusion. There is no pneumothorax. Degenerative changes are seen in the spine. IMPRESSION: 1. Small right pleural effusion with associated mild-to-moderate right basilar atelectasis/airspace disease. Aspiration is a consideration. 2. Minimal left basilar atelectasis/airspace disease. 3. Unchanged cardiomegaly. Electronically Signed   By: Zerita Boers M.D.   On: 04/23/2019 12:56    ASSESSMENT AND PLAN: 1.  Cholangiocarcinoma 2.  Pneumonia, possibly aspiration 3.  End-stage renal disease 4.  Acute on chronic diastolic heart failure 5.  Anemia due to CKD and recent chemo  -Tolerated her last cycle of chemotherapy well overall.  She is for restaging CT scan next week and then follow-up at the cancer center to discuss the results. -The patient is improving from her pneumonia.  She will be discharged home later today with antibiotics.  She will not require oxygen.   LOS: 1 day   Mikey Bussing, DNP, AGPCNP-BC, AOCNP 04/25/19

## 2019-04-25 NOTE — Plan of Care (Signed)
  Problem: Clinical Measurements: Goal: Respiratory complications will improve Outcome: Progressing   Problem: Activity: Goal: Risk for activity intolerance will decrease Outcome: Progressing   

## 2019-04-25 NOTE — Telephone Encounter (Signed)
I called pt back. She was discharged from [facility] hospital today, and on discharge paper oxycodone was discontinued. She was not told by hospital physician about that. I reassured her that she is OK to continue oxycodone as needed for her cancer related pain. She is overall feeling better, will take doxycycline which was prescribed on discharge.  Mary Chapman  04/25/2019

## 2019-04-25 NOTE — Progress Notes (Addendum)
DISCHARGE NOTE  Mary Chapman to be discharged Home per MD order. Patient verbalized understanding.  Skin clean, dry and intact without evidence of skin break down, no evidence of skin tears noted. IV catheter discontinued intact. Site without signs and symptoms of complications. Dressing and pressure applied. Pt denies pain at the site currently. No complaints noted.  Patient free of lines, drains, and wounds.   Discharge packet assembled. An After Visit Summary (AVS) was printed and given to the patient at [facility]idel. Patient escorted via wheelchair and discharged to designated family member via private transportation.  All questions and concerns addressed.   Dolores Hoose, RN

## 2019-04-26 ENCOUNTER — Telehealth: Payer: Self-pay | Admitting: Nephrology

## 2019-04-26 NOTE — Telephone Encounter (Signed)
Transition of care contact from inpatient facility  Date of discharge: 04/25/2019 Date of contact: 04/26/2019 Method: Phone Spoke to: Patient  Patient contacted to discuss transition of care from recent inpatient hospitalization. Patient was admitted to [facility] from 04/23/19 to 04/25/2019 with discharge diagnosis of dyspnea, ?aspiration pneumonia.  Medication changes were reviewed - she was able to pick up the doxycycline. Oxycodone d/c'd per discharge summary but she verified with her oncologist that it is ok to use for cancer-related pain.  She is due for dialysis tomorrow - has no barriers to attending her treatment. Her dry weight was lowered.  Other f/u needs include: none at this time.  Veneta Penton, PA-C [employer] Pager [phone]

## 2019-04-28 LAB — CULTURE, BLOOD (ROUTINE X 2)
Culture: NO GROWTH
Culture: NO GROWTH

## 2019-05-02 ENCOUNTER — Other Ambulatory Visit: Payer: Self-pay

## 2019-05-02 ENCOUNTER — Ambulatory Visit (HOSPITAL_COMMUNITY)
Admission: RE | Admit: 2019-05-02 | Discharge: 2019-05-02 | Disposition: A | Discharge status: Home / Self Care | Payer: Medicare Other | Source: Ambulatory Visit | Attending: Hematology | Admitting: Hematology

## 2019-05-02 DIAGNOSIS — C221 Intrahepatic bile duct carcinoma: Secondary | ICD-10-CM | POA: Diagnosis present

## 2019-05-04 NOTE — Progress Notes (Signed)
Mary Chapman   [phone] Fax:([phone]   Clinic Follow up Note   Patient Care Team: Default, Provider, MD as PCP - General [name], MD as PCP - Cardiology (Cardiology) Dwana Melena, MD as Attending Physician (Nephrology) Truitt Merle, MD as Consulting Physician (Hematology)  Date of Service:  05/05/2019  CHIEF COMPLAINT:  f/u intrahepatic cholangiocarcinoma  SUMMARY OF ONCOLOGIC HISTORY: Oncology History  Intrahepatic cholangiocarcinoma ([place])  02/23/2018 Imaging   CT AP W Contrast 02/23/18  IMPRESSION: 1. Large multi-cystic lesion in the central aspect of the liver adjacent to the gallbladder fossa, with several smaller satellite lesions. These are all new compared to prior CT the chest, abdomen and pelvis 08/07/2016, concerning for intrahepatic abscesses. 2. Extensive mural thickening and inflammatory changes in the region of the cecum. This is nonspecific, and could reflect either right-sided diverticulitis, focal area of colitis, or potentially even underlying neoplasm. 3. Cholelithiasis. Gallbladder is nearly completely contracted without surrounding inflammatory changes to suggest an acute cholecystitis at this time. 4. Left-sided nephrolithiasis measuring up to 8 mm in the upper pole collecting system of left kidney. No ureteral stones or findings of urinary tract obstruction. 5. Aortic atherosclerosis.   02/27/2018 Initial Biopsy   Diagnosis 02/27/18  Liver, needle/core biopsy, Right Hepatic Lobe - ADENOCARCINOMA. SEE NOTE.   02/27/2018 Miscellaneous     03/05/2018 Procedure   Colonoscopy 03/05/18  IMPRESSION - Preparation of the colon was fair. - Non-thrombosed external hemorrhoids found on digital rectal exam. - There was significant looping of the colon. - Severe diverticulosis in the recto-sigmoid colon, in the sigmoid colon, in the descending colon, in the transverse colon, at the hepatic flexure, in the ascending  colon and in the cecum. There was no evidence of diverticular bleeding. - A single (solitary) ulcer in the cecum - highly concerning for underlying malignancy. Biopsied. Phlegmonous change is possible, though often in setting of complicated diverticulosis/diverticulitis ulceration would not be as common. - Erythematous mucosa in the transverse colon, at the hepatic flexure, in the ascending colon and in the cecum. Biopsied. - Normal mucosa in the rectum, in the recto-sigmoid colon, in the sigmoid colon and in the descending colon. Biopsied. - Non-bleeding non-thrombosed external and internal hemorrhoids. -----Negative for malignancy    03/11/2018 Initial Diagnosis   Intrahepatic cholangiocarcinoma ([place])   04/02/2018 Imaging   CT CAP W contrast 04/02/18  IMPRESSION: 1. 4 mm subpleural nodule in the periphery of the right lower lobe. This is nonspecific, but strongly favored to represent a benign subpleural lymph node. Attention on follow-up studies is recommended to exclude the possibility of metastatic disease. 2. Diffuse bronchial wall thickening with moderate centrilobular and mild paraseptal emphysema; imaging findings suggestive of underlying COPD. 3. Aortic atherosclerosis, in addition to left main and left anterior descending coronary artery disease. Assessment for potential risk factor modification, dietary therapy or pharmacologic therapy may be warranted, if clinically indicated. 4. Nonobstructive calculi in the upper pole collecting system of left kidney measuring up to 7 mm.    04/27/2018 Cancer Staging   Staging form: Intrahepatic Bile Duct, AJCC 8th Edition - Clinical: Stage IIIB (cT2, cN1, cM0) - Signed by Truitt Merle, MD on 04/27/2018   05/18/2018 - 02/10/2019 Chemotherapy   Gemcitabine and Cisplatin every 2 weeks starting on 05/18/2018, with 50% dose reduction. D/c after 02/10/19 due to disease progression.     07/19/2018 Imaging   CT CAP W CONTRAST  IMPRESSION:  1. Dominant inferior liver mass is decreased in size.  Two subcentimeter clustered low-attenuation lesions in the far inferior right liver lobe, not discretely visualized on the prior noncontrast CT study, can not exclude new small satellite hepatic metastases. 2. Porta hepatis adenopathy is mildly decreased. 3. Small right pulmonary nodules are stable. 4. No additional potential new or progressive metastatic disease. 5. Small pericardial effusion, slightly increased. 6. Cholelithiasis. 7. Moderate diffuse colonic diverticulosis. 8. Aortic Atherosclerosis (ICD10-I70.0) and Emphysema (ICD10-J43.9).    11/02/2018 Imaging   CT CAP WO contrast  IMPRESSION: 1. The dominant right hepatic lobe mass and most of the smaller right hepatic lobe lesions are stable. One of the right hepatic lobe lesions appears to have increased in size, formerly about 1.1 by 1.2 cm and currently 1.9 by 1.5 cm. 2. Two small right lower lobe pulmonary nodules are stable in size. 3. A hypodense right thyroid nodule has enlarged, currently 1.9 by 1.2 cm and formerly 1.1 by 0.7 cm. Consider thyroid ultrasound for further evaluation. 4. Other imaging findings of potential clinical significance: Aortic Atherosclerosis (ICD10-I70.0) and Emphysema (ICD10-J43.9). Coronary atherosclerosis. Mild cardiomegaly. Low-density blood pool suggests anemia. Nonobstructive left nephrolithiasis. Colonic diverticulosis.   02/22/2019 Imaging   CT CAP WO Contrast  IMPRESSION: 1. Interval increase in size of 2 now dominant hepatic lesions consistent with disease progression. Assessment of liver parenchyma limited by noncontrast study. 2. Stable right lower lobe pulmonary nodule. 2nd pulmonary nodule seen previously not identified today. 3. Stable 20 mm right thyroid nodule. 4.  Emphysema. (ICD10-J43.9) 5.  Aortic Atherosclerois (ICD10-170.0)   02/24/2019 - 04/21/2019 Chemotherapy   Second-line FOLFOX  with dose reduction starting  02/24/19. D/c due to disease progression in Liver. Last dose 04/21/19.    05/02/2019 Imaging   CT CAP WO Contrast IMPRESSION: 1. Continued mild progression of the dominant central and smaller inferior right liver lesions. 2. Haziness and nodularity in the right omentum, adjacent to the inferior right liver is stable. 3. Tiny subpleural nodule right lower lobe is stable. 4. Stable 2 cm right thyroid nodule. Continued attention on follow-up recommended. 5.  Emphysema. (ICD10-J43.9) 6.  Aortic Atherosclerois (ICD10-170.0)      CURRENT THERAPY:  Second-line FOLFOX starting 02/24/19  INTERVAL HISTORY:  Mary Chapman is here for a follow up and treatment. She presents to the clinic alone. She called her daughter to be included in the visit. She notes she is doing well. She has no pain today. She still takes pain medication for when her pain is present. She notes her energy and appetite is doing fairly well. She denies anymore dizziness spells. She notes she is recovering from her PNA and wheezing is doing better. She notes she is interested in DNR/DNI and plans to make her daughter her POA.    REVIEW OF SYSTEMS:   Constitutional: Denies fevers, chills or abnormal weight loss Eyes: Denies blurriness of vision Ears, nose, mouth, throat, and face: Denies mucositis or sore throat Respiratory: Denies cough, dyspnea or wheezes Cardiovascular: Denies palpitation, chest discomfort or lower extremity swelling Gastrointestinal:  Denies nausea, heartburn or change in bowel habits Skin: Denies abnormal skin rashes Lymphatics: Denies new lymphadenopathy or easy bruising Neurological:Denies numbness, tingling or new weaknesses Behavioral/Psych: Mood is stable, no new changes  All other systems were reviewed with the patient and are negative.  MEDICAL HISTORY:  Past Medical History:  Diagnosis Date  . Diverticulitis   . Focal segmental glomerulosclerosis    ESRD on MWF HD  . Hypertension   .  intrahepatic cholangio ca dx'd10/2019  . Renal insufficiency  dialysis pt MWF  . Tobacco abuse     SURGICAL HISTORY: Past Surgical History:  Procedure Laterality Date  . AV FISTULA PLACEMENT Left 03/18/2017   Procedure: Brachiocephalic ARTERIOVENOUS (AV) FISTULA CREATION left arm;  Surgeon: Elam Dutch, MD;  Location: [facility];  Service: Vascular;  Laterality: Left;  . BIOPSY  03/05/2018   Procedure: BIOPSY;  Surgeon: Rush Landmark Telford Nab., MD;  Location: [facility];  Service: Gastroenterology;;  enteroscopy bx /cytology brushing Greig Castilla bx  . COLONOSCOPY WITH PROPOFOL N/A 03/05/2018   Procedure: COLONOSCOPY WITH PROPOFOL;  Surgeon: Rush Landmark Telford Nab., MD;  Location: [facility];  Service: Gastroenterology;  Laterality: N/A;  Bx, Spot  . ENTEROSCOPY N/A 03/05/2018   Procedure: ENTEROSCOPY;  Surgeon: Rush Landmark Telford Nab., MD;  Location: [facility];  Service: Gastroenterology;  Laterality: N/A;  BX, Brushing, & Spot  . INSERTION OF DIALYSIS CATHETER N/A 03/18/2017   Procedure: INSERTION OF DIALYSIS CATHETER;  Surgeon: Elam Dutch, MD;  Location: [facility];  Service: Vascular;  Laterality: N/A;  . IR GENERIC HISTORICAL  08/15/2016   IR US GUIDE VASC ACCESS RIGHT 08/15/2016 Arne Cleveland, MD [facility]  . IR GENERIC HISTORICAL  08/15/2016   IR FLUORO GUIDE CV LINE RIGHT 08/15/2016 Arne Cleveland, MD [facility]  . IR IMAGING GUIDED PORT INSERTION  05/06/2018  . SUBMUCOSAL INJECTION  03/05/2018   Procedure: SUBMUCOSAL INJECTION;  Surgeon: Rush Landmark Telford Nab., MD;  Location: [facility];  Service: Gastroenterology;;  spot tatoo  . TUBAL LIGATION      I have reviewed the social history and family history with the patient and they are unchanged from previous note.  ALLERGIES:  has No Known Allergies.  MEDICATIONS:  Current Outpatient Medications  Medication Sig Dispense Refill  . amLODipine (NORVASC) 10 MG tablet Take 10 mg by mouth at bedtime.     . folic  acid (FOLVITE) 1 MG tablet Take 1 tablet (1 mg total) by mouth daily. 30 tablet 0  . gabapentin (NEURONTIN) 100 MG capsule Take 1 capsule (100 mg total) by mouth at bedtime as needed (pain).    . metoprolol tartrate (LOPRESSOR) 25 MG tablet Take 0.5 tablets (12.5 mg total) by mouth 2 (two) times daily. 30 tablet 0  . multivitamin (RENA-VIT) TABS tablet Take 1 tablet by mouth 3 (three) times a week.    . Nutritional Supplements (FEEDING SUPPLEMENT, NEPRO CARB STEADY,) LIQD Take 237 mLs by mouth 3 (three) times daily as needed (Supplement). 90 Can 0  . pantoprazole (PROTONIX) 40 MG tablet TAKE 1 TABLET BY MOUTH TWICE A DAY (Patient taking differently: Take 40 mg by mouth 2 (two) times daily. ) 60 tablet 1  . polyethylene glycol (MIRALAX) packet Take 17 g by mouth daily. (Patient taking differently: Take 17 g by mouth daily as needed for mild constipation. ) 30 each 0  . senna-docusate (SENOKOT-S) 8.6-50 MG tablet Take 2 tablets by mouth 2 (two) times daily. (Patient taking differently: Take 2 tablets by mouth at bedtime as needed for mild constipation. ) 30 tablet 0   No current facility-administered medications for this visit.    PHYSICAL EXAMINATION: ECOG PERFORMANCE STATUS: 2 - Symptomatic, <50% confined to bed  Vitals:   05/05/19 1124  BP: (!) 189/97  Pulse: 72  Resp: 20  Temp: 98.9 F (37.2 C)  SpO2: 97%   Filed Weights   05/05/19 1124  Weight: 141 lb 11.2 oz (64.3 kg)    GENERAL:alert, no distress and comfortable SKIN: skin color, texture, turgor are  normal, no rashes or significant lesions EYES: normal, Conjunctiva are pink and non-injected, sclera clear  NECK: supple, thyroid normal size, non-tender, without nodularity LYMPH:  no palpable lymphadenopathy in the cervical, axillary  LUNGS: clear to auscultation and percussion with normal breathing effort HEART: regular rate & rhythm and no murmurs and no lower extremity edema ABDOMEN:abdomen soft, non-tender and normal bowel  sounds Musculoskeletal:no cyanosis of digits and no clubbing  NEURO: alert & oriented x 3 with fluent speech, no focal motor/sensory deficits  LABORATORY DATA:  I have reviewed the data as listed CBC Latest Ref Rng & Units 05/05/2019 04/25/2019 04/24/2019  WBC 4.0 - 10.5 K/uL 6.2 4.0 4.8  Hemoglobin 12.0 - 15.0 g/dL 9.1(L) 8.7(L) 8.8(L)  Hematocrit 36.0 - 46.0 % 31.2(L) 28.8(L) 29.1(L)  Platelets 150 - 400 K/uL 248 182 179     CMP Latest Ref Rng & Units 05/05/2019 04/25/2019 04/24/2019  Glucose 70 - 99 mg/dL 85 83 93  BUN 8 - 23 mg/dL 27(H) 33(H) 10  Creatinine 0.44 - 1.00 mg/dL 6.16(HH) 7.25(H) 4.19(H)  Sodium 135 - 145 mmol/L 142 139 138  Potassium 3.5 - 5.1 mmol/L 3.9 4.4 4.0  Chloride 98 - 111 mmol/L 104 101 98  CO2 22 - 32 mmol/L _0 Calcium 8.9 - 10.3 mg/dL 7.6(L) 7.0(L) 7.7(L)  Total Protein 6.5 - 8.1 g/dL 6.7 - 6.1(L)  Total Bilirubin 0.3 - 1.2 mg/dL 0.3 - 0.5  Alkaline Phos 38 - 126 U/L 108 - 97  AST 15 - 41 U/L 17 - 15  ALT 0 - 44 U/L <6 - 10      RADIOGRAPHIC STUDIES: I have personally reviewed the radiological images as listed and agreed with the findings in the report. No results found.   ASSESSMENT & PLAN:  Jaelynn Pozo is a 73 y.o. female with   1. Adenocarcinomainliver, likelyintrahepatic cholangiocarcinoma, cT2N1M0 stage IIIB, unresectable, MS-Stable -Diagnosed in 02/2018. She is not eligible for surgery due to tumor size and location.She was seen by Dr. Cloyd Stagers at [facility].  -She was initiallytreated with first-lineCisplatin/Gemcitabineq2weekssince 12/2019but unfortunately had disease progression as seen on 02/2019 CT CAP.  -I started her on second line FOLFOX with dose reduction every 2 weeks on 02/24/19.  -I personally reviewed her CT CAP images from 05/02/19 with pt which shows about 20% progression of the dominate central and smaller right liver lesions. Nodularity of right omentum, tiny subpleural  Right lung nodule are indeterminate. Overall she  has disease progression, will d/c FOLFOX treatment.  -I discussed next-line treatment options. Given she is on dialysis her chemo options are limited. I reviewed her FO results show BRCA1 mutation of her tumor which makes her eligible for PARP2 inhibitor Lynparza ( olaparib ). I discussed the benefit and potential side effect, which includes but not limited to fatigue, anemia, neutropenia and thrombocytopenia, nausea, diarrhea, dizziness, arthralgia, etc. she voiced good understanding and is interested. -I will check with our oral pharmacist to see if Lynparza dose needs adjustment for dialysis patient -I discussed option of target therapy with Y90 radio embilization to the liver. I discussed this is temporary option to control her disease without chemo. She is interested, will refer her.  -f/u in 3 weeks    2. Right abdominal pain  -Secondary to #1  -Pain intermitted but with recent increasing pain level and hepatomegaly. She is currently taking Oxycodone55m once daily now as needed.   -Her 05/02/19 CT scan shows disease progression in liver. This is the likely cause  related to her pain.   3. HTN  -Currently on metoprololand amlodipine  -Her BP remains consistently high. I have spoken with her nephrologist about adjusting her medications to better control her HTN.  3. End Stage Renal Disease, on HD MWF -Treated with dialysis, tolerating well. Continue to f/u with nephrologist Dr. Augustin Coupe -Given chemo pump D/c is on every other Saturday Ipreviouslyrecommendedshe change her Dialysis timeto later on Saturdays, she is agreeable.  4. Anemia of chronic diseaseand chemo induced anemia, secondary to CKD and Malignancy -She has receivedblood transfusion intermittently  -Recentlyrestartedher onEPO at her dialysis center due to her worsening anemia  -stable overall.  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 -Controlled on  Gabapentin199m at night 2-3 times a week.   6.Goal of care discussion   Goal of care discussion, DNR/DNI -We again discussed the incurable nature of her cancer, and the overall poor prognosis, especially if she does not have good response to limited chemotherapy options or progress on chemo -The patient understands the goal of care is palliative. -I recommend DNR/DNI, she agreed with DNR/DNI. She plans to make her daughter LWonda Amis    Plan -I refilled her Oxycodone today  -CT CAP reviewed, she has disease progression in liver  -Send IR Referral for Y90  -I will check if Lynparza dose needs adjustment with pharmacy and call in, she will start when she receives it   -D/c FOLFOX -Lab and f/u in 3 weeks  -I spoke with her daughter during her visit  -she agrees with DNR   No problem-specific Assessment & Plan notes found for this encounter.   No orders of the defined types were placed in this encounter.  All questions were answered. The patient knows to call the clinic with any problems, questions or concerns. No barriers to learning was detected. I spent 30 minutes  counseling the patient face to face. The total time spent in the appointment was 40 minutes and more than 50% was on counseling and review of test results     YTruitt Merle MD 05/05/2019   I, AJoslyn Devon am acting as scribe for YTruitt Merle MD.   I have reviewed the above documentation for accuracy and completeness, and I agree with the above.

## 2019-05-05 ENCOUNTER — Other Ambulatory Visit: Payer: Self-pay

## 2019-05-05 ENCOUNTER — Inpatient Hospital Stay: Payer: Medicare Other

## 2019-05-05 ENCOUNTER — Telehealth: Payer: Self-pay | Admitting: Hematology

## 2019-05-05 ENCOUNTER — Inpatient Hospital Stay: Payer: Medicare Other | Admitting: Hematology

## 2019-05-05 ENCOUNTER — Encounter: Payer: Self-pay | Admitting: Hematology

## 2019-05-05 VITALS — BP 189/97 | HR 72 | Temp 98.9°F | Resp 20 | Ht 66.0 in | Wt 141.7 lb

## 2019-05-05 DIAGNOSIS — I10 Essential (primary) hypertension: Secondary | ICD-10-CM

## 2019-05-05 DIAGNOSIS — G620 Drug-induced polyneuropathy: Secondary | ICD-10-CM | POA: Diagnosis not present

## 2019-05-05 DIAGNOSIS — C221 Intrahepatic bile duct carcinoma: Secondary | ICD-10-CM | POA: Diagnosis present

## 2019-05-05 DIAGNOSIS — D631 Anemia in chronic kidney disease: Secondary | ICD-10-CM | POA: Diagnosis not present

## 2019-05-05 DIAGNOSIS — N186 End stage renal disease: Secondary | ICD-10-CM | POA: Diagnosis not present

## 2019-05-05 DIAGNOSIS — Z95828 Presence of other vascular implants and grafts: Secondary | ICD-10-CM

## 2019-05-05 DIAGNOSIS — Z992 Dependence on renal dialysis: Secondary | ICD-10-CM | POA: Diagnosis not present

## 2019-05-05 DIAGNOSIS — Z452 Encounter for adjustment and management of vascular access device: Secondary | ICD-10-CM | POA: Diagnosis not present

## 2019-05-05 LAB — CBC WITH DIFFERENTIAL (CANCER CENTER ONLY)
Abs Immature Granulocytes: 0.04 10*3/uL (ref 0.00–0.07)
Basophils Absolute: 0 10*3/uL (ref 0.0–0.1)
Basophils Relative: 0 %
Eosinophils Absolute: 0.5 10*3/uL (ref 0.0–0.5)
Eosinophils Relative: 9 %
HCT: 31.2 % — ABNORMAL LOW (ref 36.0–46.0)
Hemoglobin: 9.1 g/dL — ABNORMAL LOW (ref 12.0–15.0)
Immature Granulocytes: 1 %
Lymphocytes Relative: 16 %
Lymphs Abs: 1 10*3/uL (ref 0.7–4.0)
MCH: 28.2 pg (ref 26.0–34.0)
MCHC: 29.2 g/dL — ABNORMAL LOW (ref 30.0–36.0)
MCV: 96.6 fL (ref 80.0–100.0)
Monocytes Absolute: 1.1 10*3/uL — ABNORMAL HIGH (ref 0.1–1.0)
Monocytes Relative: 17 %
Neutro Abs: 3.6 10*3/uL (ref 1.7–7.7)
Neutrophils Relative %: 57 %
Platelet Count: 248 10*3/uL (ref 150–400)
RBC: 3.23 MIL/uL — ABNORMAL LOW (ref 3.87–5.11)
RDW: 20.1 % — ABNORMAL HIGH (ref 11.5–15.5)
WBC Count: 6.2 10*3/uL (ref 4.0–10.5)
nRBC: 0 % (ref 0.0–0.2)

## 2019-05-05 LAB — CMP (CANCER CENTER ONLY)
ALT: <6 U/L (ref 0–44)
AST: 17 U/L (ref 15–41)
Albumin: 2.8 g/dL — ABNORMAL LOW (ref 3.5–5.0)
Alkaline Phosphatase: 108 U/L (ref 38–126)
Anion gap: 12 (ref 5–15)
BUN: 27 mg/dL — ABNORMAL HIGH (ref 8–23)
CO2: 26 mmol/L (ref 22–32)
Calcium: 7.6 mg/dL — ABNORMAL LOW (ref 8.9–10.3)
Chloride: 104 mmol/L (ref 98–111)
Creatinine: 6.16 mg/dL (ref 0.44–1.00)
GFR, Est AFR Am: 7 mL/min — ABNORMAL LOW (ref >60)
GFR, Estimated: 6 mL/min — ABNORMAL LOW (ref >60)
Glucose, Bld: 85 mg/dL (ref 70–99)
Potassium: 3.9 mmol/L (ref 3.5–5.1)
Sodium: 142 mmol/L (ref 135–145)
Total Bilirubin: 0.3 mg/dL (ref 0.3–1.2)
Total Protein: 6.7 g/dL (ref 6.5–8.1)

## 2019-05-05 MED ORDER — SODIUM CHLORIDE 0.9% FLUSH
10.0000 mL | INTRAVENOUS | Status: DC | PRN
  Administered 2019-05-05: 10 mL
  Filled 2019-05-05: qty 10

## 2019-05-05 MED ORDER — HEPARIN SOD (PORK) LOCK FLUSH 100 UNIT/ML IV SOLN
500.0000 [IU] | Freq: Once | INTRAVENOUS | Status: AC
  Administered 2019-05-05: 500 [IU] via INTRAVENOUS
  Filled 2019-05-05: qty 5

## 2019-05-05 NOTE — Telephone Encounter (Signed)
Scheduled appt per 12/17 los.  Spoke with pt and she is aware of the appt date and time 

## 2019-05-05 NOTE — Patient Instructions (Signed)

## 2019-05-06 LAB — CANCER ANTIGEN 19-9: CA 19-9: 42 U/mL — ABNORMAL HIGH (ref 0–35)

## 2019-05-06 MED ORDER — OLAPARIB 100 MG PO TABS: 200.0000 mg | ORAL_TABLET | Freq: Two times a day (BID) | ORAL | 0 refills | Status: DC

## 2019-05-06 NOTE — Addendum Note (Signed)
Addended by: Truitt Merle on: 05/06/2019 05:18 PM   Modules accepted: Orders

## 2019-05-07 ENCOUNTER — Inpatient Hospital Stay: Payer: Medicare Other

## 2019-05-08 ENCOUNTER — Other Ambulatory Visit: Payer: Self-pay | Admitting: Hematology

## 2019-05-08 DIAGNOSIS — C221 Intrahepatic bile duct carcinoma: Secondary | ICD-10-CM

## 2019-05-09 ENCOUNTER — Telehealth: Payer: Self-pay

## 2019-05-09 ENCOUNTER — Other Ambulatory Visit: Payer: Self-pay | Admitting: Hematology

## 2019-05-09 ENCOUNTER — Telehealth: Payer: Self-pay | Admitting: Pharmacist

## 2019-05-09 MED ORDER — OLAPARIB 100 MG PO TABS: 100.0000 mg | ORAL_TABLET | Freq: Two times a day (BID) | ORAL | 0 refills | Status: DC

## 2019-05-09 NOTE — Telephone Encounter (Signed)
Oral Oncology Patient Advocate Encounter  Received notification from [employer] that the request for prior authorization for Mary Chapman has been denied..     This determination is currently being appealed.  The appeal packet was sent online through cover my meds.   This encounter will continue to be updated until final determination.    [place] Patient [place] Phone [phone] Fax [phone] 05/09/2019 12:32 PM

## 2019-05-09 NOTE — Telephone Encounter (Signed)
Oral Oncology Patient Advocate Encounter  Received notification from [employer] that prior authorization for Mary Chapman is required.  PA submitted on CoverMyMeds Key [id number] Status is pending  Oral Oncology Clinic will continue to follow.   [place] Patient [place] Phone [phone] Fax [phone] 05/09/2019 11:44 AM

## 2019-05-09 NOTE — Telephone Encounter (Signed)
Oral Oncology Pharmacist Encounter  Received new prescription for Lynparza (olaparib) for the treatment of stage IIIB cholangiocarcinoma, planned duration until disease progression or unacceptable drug toxicity. Patient has a BRCA1 somatic mutation on [employer].   CMP/CBC from 05/05/2019 assessed, SCr elevated. Patient received hemodialysis. Olaparib dose is typically adjusted for for renal impairment, but there is no dose adjustment provided for patients receiving hemodialysis. Submitted a medical inquiry to [employer]. Provided with case report of one hemodialysis patient that received olaparib 200mg daily. Reported that patient tolerated that dosing.   JP Grabowski, J Glajzer, R Richter, et al. 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 Int J Gynecol Cancer. 2017; 27(suppl 4):293  Conducted a literature myself and could not find any additional information on the use to olaparib in hemodialysis.   Above information shared with Dr. Feng. Patient will be started on 100mg twice daily dosing.   Current medication list in Epic reviewed, no DDIs with olaparib identified.   Prescription has been e-scribed to the [facility] for benefits analysis and approval.  Oral Oncology Clinic will continue to follow for insurance authorization, copayment issues, initial counseling and start date.  Alyson N. Leonard, PharmD, BCPS, BCOP Hematology/Oncology Clinical Pharmacist [facility] Oral Chemotherapy [facility] [phone]  05/09/2019 9:10 AM  

## 2019-05-16 NOTE — Telephone Encounter (Signed)
Oral Chemotherapy Pharmacist Encounter   Appeal submitted to [employer] as an urgent request. Will continue to follow for appeal determination.  Darl Pikes, PharmD, BCPS, [facility] Hematology/Oncology Clinical Pharmacist [facility] Oral [facility] [phone]  05/16/2019 1:40 PM

## 2019-05-19 ENCOUNTER — Other Ambulatory Visit: Payer: Medicare Other

## 2019-05-19 ENCOUNTER — Ambulatory Visit: Payer: Medicare Other | Admitting: Hematology

## 2019-05-19 ENCOUNTER — Ambulatory Visit: Payer: Medicare Other

## 2019-05-19 NOTE — Telephone Encounter (Signed)
Appeal Approval  Appeal for Lonie [facility] was approved.  Patient's copay $2140.42  Attempting to sign patient up for [place] [employer] manufacturer assistance.  [place] Patient [place] Phone [phone] Fax [phone] 05/19/2019 10:18 AM

## 2019-05-26 NOTE — Progress Notes (Signed)
[place] [facility]   Telephone:(336) [phone] [phone]   Clinic Follow up Note   Patient Care Team: Default, Provider, MD as PCP - General [name], MD as PCP - Cardiology (Cardiology) Dwana Melena, MD as Attending Physician (Nephrology) Truitt Merle, MD as Consulting Physician (Hematology)  Date of Service:  05/27/2019  CHIEF COMPLAINT: f/u intrahepatic cholangiocarcinoma  SUMMARY OF ONCOLOGIC HISTORY: Oncology History  Intrahepatic cholangiocarcinoma ([place])  02/23/2018 Imaging   CT AP W Contrast 02/23/18  IMPRESSION: 1. Large multi-cystic lesion in the central aspect of the liver adjacent to the gallbladder fossa, with several smaller satellite lesions. These are all new compared to prior CT the chest, abdomen and pelvis 08/07/2016, concerning for intrahepatic abscesses. 2. Extensive mural thickening and inflammatory changes in the region of the cecum. This is nonspecific, and could reflect either right-sided diverticulitis, focal area of colitis, or potentially even underlying neoplasm. 3. Cholelithiasis. Gallbladder is nearly completely contracted without surrounding inflammatory changes to suggest an acute cholecystitis at this time. 4. Left-sided nephrolithiasis measuring up to 8 mm in the upper pole collecting system of left kidney. No ureteral stones or findings of urinary tract obstruction. 5. Aortic atherosclerosis.   02/27/2018 Initial Biopsy   Diagnosis 02/27/18  Liver, needle/core biopsy, Right Hepatic Lobe - ADENOCARCINOMA. SEE NOTE.   02/27/2018 Miscellaneous     03/05/2018 Procedure   Colonoscopy 03/05/18  IMPRESSION - Preparation of the colon was fair. - Non-thrombosed external hemorrhoids found on digital rectal exam. - There was significant looping of the colon. - Severe diverticulosis in the recto-sigmoid colon, in the sigmoid colon, in the descending colon, in the transverse colon, at the hepatic flexure, in the ascending  colon and in the cecum. There was no evidence of diverticular bleeding. - A single (solitary) ulcer in the cecum - highly concerning for underlying malignancy. Biopsied. Phlegmonous change is possible, though often in setting of complicated diverticulosis/diverticulitis ulceration would not be as common. - Erythematous mucosa in the transverse colon, at the hepatic flexure, in the ascending colon and in the cecum. Biopsied. - Normal mucosa in the rectum, in the recto-sigmoid colon, in the sigmoid colon and in the descending colon. Biopsied. - Non-bleeding non-thrombosed external and internal hemorrhoids. -----Negative for malignancy    03/11/2018 Initial Diagnosis   Intrahepatic cholangiocarcinoma ([name])   04/02/2018 Imaging   CT CAP W contrast 04/02/18  IMPRESSION: 1. 4 mm subpleural nodule in the periphery of the right lower lobe. This is nonspecific, but strongly favored to represent a benign subpleural lymph node. Attention on follow-up studies is recommended to exclude the possibility of metastatic disease. 2. Diffuse bronchial wall thickening with moderate centrilobular and mild paraseptal emphysema; imaging findings suggestive of underlying COPD. 3. Aortic atherosclerosis, in addition to left main and left anterior descending coronary artery disease. Assessment for potential risk factor modification, dietary therapy or pharmacologic therapy may be warranted, if clinically indicated. 4. Nonobstructive calculi in the upper pole collecting system of left kidney measuring up to 7 mm.    04/27/2018 Cancer Staging   Staging form: Intrahepatic Bile Duct, AJCC 8th Edition - Clinical: Stage IIIB (cT2, cN1, cM0) - Signed by Truitt Merle, MD on 04/27/2018   05/18/2018 - 02/10/2019 Chemotherapy   Gemcitabine and Cisplatin every 2 weeks starting on 05/18/2018, with 50% dose reduction. D/c after 02/10/19 due to disease progression.     07/19/2018 Imaging   CT CAP W CONTRAST  IMPRESSION:  1. Dominant inferior liver mass is decreased in size. Two  subcentimeter clustered low-attenuation lesions in the far inferior right liver lobe, not discretely visualized on the prior noncontrast CT study, can not exclude new small satellite hepatic metastases. 2. Porta hepatis adenopathy is mildly decreased. 3. Small right pulmonary nodules are stable. 4. No additional potential new or progressive metastatic disease. 5. Small pericardial effusion, slightly increased. 6. Cholelithiasis. 7. Moderate diffuse colonic diverticulosis. 8. Aortic Atherosclerosis (ICD10-I70.0) and Emphysema (ICD10-J43.9).    11/02/2018 Imaging   CT CAP WO contrast  IMPRESSION: 1. The dominant right hepatic lobe mass and most of the smaller right hepatic lobe lesions are stable. One of the right hepatic lobe lesions appears to have increased in size, formerly about 1.1 by 1.2 cm and currently 1.9 by 1.5 cm. 2. Two small right lower lobe pulmonary nodules are stable in size. 3. A hypodense right thyroid nodule has enlarged, currently 1.9 by 1.2 cm and formerly 1.1 by 0.7 cm. Consider thyroid ultrasound for further evaluation. 4. Other imaging findings of potential clinical significance: Aortic Atherosclerosis (ICD10-I70.0) and Emphysema (ICD10-J43.9). Coronary atherosclerosis. Mild cardiomegaly. Low-density blood pool suggests anemia. Nonobstructive left nephrolithiasis. Colonic diverticulosis.   02/22/2019 Imaging   CT CAP WO Contrast  IMPRESSION: 1. Interval increase in size of 2 now dominant hepatic lesions consistent with disease progression. Assessment of liver parenchyma limited by noncontrast study. 2. Stable right lower lobe pulmonary nodule. 2nd pulmonary nodule seen previously not identified today. 3. Stable 20 mm right thyroid nodule. 4.  Emphysema. (ICD10-J43.9) 5.  Aortic Atherosclerois (ICD10-170.0)   02/24/2019 - 04/21/2019 Chemotherapy   Second-line FOLFOX  with dose reduction starting  02/24/19. D/c due to disease progression in Liver. Last dose 04/21/19.    05/02/2019 Imaging   CT CAP WO Contrast IMPRESSION: 1. Continued mild progression of the dominant central and smaller inferior right liver lesions. 2. Haziness and nodularity in the right omentum, adjacent to the inferior right liver is stable. 3. Tiny subpleural nodule right lower lobe is stable. 4. Stable 2 cm right thyroid nodule. Continued attention on follow-up recommended. 5.  Emphysema. (ICD10-J43.9) 6.  Aortic Atherosclerois (ICD10-170.0)      CURRENT THERAPY:  PENDING oral Lonie Peak   INTERVAL HISTORY:  Mary Chapman is here for a follow up. She presents to the clinic alone. She notes her RUQ is more sensitive she feels her pain medication oxycodone has been helping her. She notes 2 days ago she had doubling pain early in the morning. She wonders does she need to take pain meds over night. She denies nausea. She notes she has lost 4 pounds although she notes she eats adequately. She denies fever or chills, cough, or SOB. She feels she is still able to take care of herself at home even with lowered energy. She denies falling.     REVIEW OF SYSTEMS:   Constitutional: Denies fevers, chills (+) mild weight loss Eyes: Denies blurriness of vision Ears, nose, mouth, throat, and face: Denies mucositis or sore throat Respiratory: Denies cough, dyspnea or wheezes Cardiovascular: Denies palpitation, chest discomfort or lower extremity swelling Gastrointestinal:  Denies nausea, heartburn or change in bowel habits (+) Increased RUQ pain  Skin: Denies abnormal skin rashes Lymphatics: Denies new lymphadenopathy or easy bruising Neurological:Denies numbness, tingling or new weaknesses Behavioral/Psych: Mood is stable, no new changes  All other systems were reviewed with the patient and are negative.  MEDICAL HISTORY:  Past Medical History:  Diagnosis Date  . Diverticulitis   . Focal segmental glomerulosclerosis     ESRD on MWF HD  .  Hypertension   . intrahepatic cholangio ca dx'd10/2019  . Renal insufficiency    dialysis pt MWF  . Tobacco abuse     SURGICAL HISTORY: Past Surgical History:  Procedure Laterality Date  . AV FISTULA PLACEMENT Left 03/18/2017   Procedure: Brachiocephalic ARTERIOVENOUS (AV) FISTULA CREATION left arm;  Surgeon: Elam Dutch, MD;  Location: [facility];  Service: Vascular;  Laterality: Left;  . BIOPSY  03/05/2018   Procedure: BIOPSY;  Surgeon: Rush Landmark Telford Nab., MD;  Location: [facility];  Service: Gastroenterology;;  enteroscopy bx /cytology brushing Greig Castilla bx  . COLONOSCOPY WITH PROPOFOL N/A 03/05/2018   Procedure: COLONOSCOPY WITH PROPOFOL;  Surgeon: Rush Landmark Telford Nab., MD;  Location: [facility];  Service: Gastroenterology;  Laterality: N/A;  Bx, Spot  . ENTEROSCOPY N/A 03/05/2018   Procedure: ENTEROSCOPY;  Surgeon: Rush Landmark Telford Nab., MD;  Location: [facility];  Service: Gastroenterology;  Laterality: N/A;  BX, Brushing, & Spot  . INSERTION OF DIALYSIS CATHETER N/A 03/18/2017   Procedure: INSERTION OF DIALYSIS CATHETER;  Surgeon: Elam Dutch, MD;  Location: [facility];  Service: Vascular;  Laterality: N/A;  . IR GENERIC HISTORICAL  08/15/2016   IR US GUIDE VASC ACCESS RIGHT 08/15/2016 Arne Cleveland, MD [facility]  . IR GENERIC HISTORICAL  08/15/2016   IR FLUORO GUIDE CV LINE RIGHT 08/15/2016 Arne Cleveland, MD [facility]  . IR IMAGING GUIDED PORT INSERTION  05/06/2018  . SUBMUCOSAL INJECTION  03/05/2018   Procedure: SUBMUCOSAL INJECTION;  Surgeon: Rush Landmark Telford Nab., MD;  Location: [facility] [place];  Service: Gastroenterology;;  spot tatoo  . TUBAL LIGATION      I have reviewed the social history and family history with the patient and they are unchanged from previous note.  ALLERGIES:  has No Known Allergies.  MEDICATIONS:  Current Outpatient Medications  Medication Sig Dispense Refill  . amLODipine (NORVASC) 10 MG tablet Take 10  mg by mouth at bedtime.     . folic acid (FOLVITE) 1 MG tablet Take 1 tablet (1 mg total) by mouth daily. 30 tablet 0  . gabapentin (NEURONTIN) 100 MG capsule Take 1 capsule (100 mg total) by mouth at bedtime as needed (pain).    . metoprolol tartrate (LOPRESSOR) 25 MG tablet Take 0.5 tablets (12.5 mg total) by mouth 2 (two) times daily. 30 tablet 0  . multivitamin (RENA-VIT) TABS tablet Take 1 tablet by mouth 3 (three) times a week.    . Nutritional Supplements (FEEDING SUPPLEMENT, NEPRO CARB STEADY,) LIQD Take 237 mLs by mouth 3 (three) times daily as needed (Supplement). 90 Can 0  . olaparib (LYNPARZA) 100 MG tablet Take 1 tablet (100 mg total) by mouth 2 (two) times daily. Swallow whole. May take with food to decrease nausea and vomiting. 60 tablet 0  . oxycodone (OXY-IR) 5 MG capsule Take 10 mg by mouth every 6 (six) hours as needed.    . pantoprazole (PROTONIX) 40 MG tablet Take 1 tablet (40 mg total) by mouth 2 (two) times daily. 60 tablet 2  . polyethylene glycol (MIRALAX) packet Take 17 g by mouth daily. (Patient taking differently: Take 17 g by mouth daily as needed for mild constipation. ) 30 each 0  . senna-docusate (SENOKOT-S) 8.6-50 MG tablet Take 2 tablets by mouth 2 (two) times daily. (Patient taking differently: Take 2 tablets by mouth at bedtime as needed for mild constipation. ) 30 tablet 0   No current facility-administered medications for this visit.    PHYSICAL EXAMINATION: ECOG PERFORMANCE STATUS: 2 - Symptomatic, <  50% confined to bed  Vitals:   05/27/19 1445  BP: (!) 173/101  Pulse: 93  Resp: 17  Temp: 98.3 F (36.8 C)  SpO2: 97%   Filed Weights   05/27/19 1445  Weight: 138 lb (62.6 kg)    GENERAL:alert, no distress and comfortable SKIN: skin color, texture, turgor are normal, no rashes or significant lesions EYES: normal, Conjunctiva are pink and non-injected, sclera clear  NECK: supple, thyroid normal size, non-tender, without nodularity LYMPH:  no  palpable lymphadenopathy in the cervical, axillary  LUNGS: clear to auscultation and percussion with normal breathing effort HEART: regular rate & rhythm and no murmurs and no lower extremity edema ABDOMEN:abdomen soft, non-tender and normal bowel sounds (+) Palpable hepatomegaly 9cm below ribcage, with RUQ tenderness  Musculoskeletal:no cyanosis of digits and no clubbing  NEURO: alert & oriented x 3 with fluent speech, no focal motor/sensory deficits  LABORATORY DATA:  I have reviewed the data as listed CBC Latest Ref Rng & Units 05/27/2019 05/05/2019 04/25/2019  WBC 4.0 - 10.5 K/uL 6.3 6.2 4.0  Hemoglobin 12.0 - 15.0 g/dL 11.7(L) 9.1(L) 8.7(L)  Hematocrit 36.0 - 46.0 % 38.5 31.2(L) 28.8(L)  Platelets 150 - 400 K/uL 286 248 182     CMP Latest Ref Rng & Units 05/27/2019 05/05/2019 04/25/2019  Glucose 70 - 99 mg/dL 119(H) 85 83  BUN 8 - 23 mg/dL 11 27(H) 33(H)  Creatinine 0.44 - 1.00 mg/dL 3.57(HH) 6.16(HH) 7.25(H)  Sodium 135 - 145 mmol/L 142 142 139  Potassium 3.5 - 5.1 mmol/L 3.3(L) 3.9 4.4  Chloride 98 - 111 mmol/L 98 104 101  CO2 22 - 32 mmol/L 33(H) 26 27  Calcium 8.9 - 10.3 mg/dL 8.1(L) 7.6(L) 7.0(L)  Total Protein 6.5 - 8.1 g/dL 7.4 6.7 -  Total Bilirubin 0.3 - 1.2 mg/dL 0.3 0.3 -  Alkaline Phos 38 - 126 U/L 136(H) 108 -  AST 15 - 41 U/L 13(L) 17 -  ALT 0 - 44 U/L <6 <6 -      RADIOGRAPHIC STUDIES: I have personally reviewed the radiological images as listed and agreed with the findings in the report. No results found.   ASSESSMENT & PLAN:  Mary Chapman is a 74 y.o. female with   1. Adenocarcinomainliver, likelyintrahepatic cholangiocarcinoma, cT2N1M0 stage IIIB, unresectable, MS-Stable -Diagnosed in 02/2018. She is not eligible for surgery due to tumor size and location.She was seen by Dr. Cloyd Stagers at [facility].  -She was initiallytreated with first-lineCisplatin/Gemcitabineq2weekssince 12/2019but unfortunately had disease progression as seen on 02/2019 CT CAP.  -I  started her on second line FOLFOX with dose reduction every 2 weeks on 02/24/19, unfortunately she recently had 20% disease progressed based on 05/02/19 CT CAP.  -I previously discussed next-line treatment options. Given she is on dialysis her chemo options are limited. She is interested in target Y90 radio embilization to the liver. I have referred her to IR for consult and more information.  -I reviewed her FO results show BRCA1 mutation of her tumor which makes her eligible for PARP2 inhibitor Lynparza ( olaparib ). She agreed to proceed.  Lonie Peak was finally approved by her insurance after appeal.  She is waiting on financial assistance for her high co-pay, will likely start in a week. She signed the application today  -F/u in 2-3 after she has started pill.   2. Right abdominal pain  -Secondary to #1  -Her 05/02/19 CT scan shows disease progression in liver. Her pain is intermitted but continues to increase and continues to  have hepatomegaly on exam. -She is currently taking Oxycodone30monce daily now as needed, OK to take more as needed.  She will let me know when she needs refill.   3. HTN  -Currently on metoprololand amlodipine  -Her BP remains consistently high. Ihave spokenwithher nephrologistaboutadjustingher medications to better control her HTN.  3. End Stage Renal Disease, on HD MWF -Treated with dialysis, tolerating well. Continue to f/u with nephrologist Dr. LAugustin Coupe-Given chemo pump D/c is on every other Saturday Ipreviouslyrecommendedshe change her Dialysis timeto later on Saturdays, she is agreeable.  4. Anemia of chronic diseaseand chemo induced anemia, secondary to CKD and Malignancy -She has receivedblood transfusion intermittently  -Recentlyrestartedher onEPO at her dialysis center due to her worsening anemia  -improved and now mild.   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 -Controlled  on Gabapentin1029mat night 2-3 times a week.  6.Goal of care discussion, DNR/DNI -We previously discussed the incurable nature of her cancer, and the overall poor prognosis, especially if she does not have good response to limited chemotherapy options or progress on chemo -The patient understands the goal of care is palliative. -I recommend DNR/DNI, she agreed with DNR/DNI. She plans to make her daughter LaWonda Amis   Plan -[place] she [place] it next week  -Send IR referral for Y90 -lab and F/u in 2-3 weeks   No problem-specific Assessment & Plan notes found for this encounter.   Orders Placed This Encounter  Procedures  . IR Radiologist Eval & Mgmt    Standing Status:   Future    Standing Expiration Date:   07/24/2020    Order Specific Question:   Reason for Exam (SYMPTOM  OR DIAGNOSIS REQUIRED)    Answer:   Y90 THERAPY FOR HER CHOLANGIOCARCINOMA    Order Specific Question:   Preferred Imaging Location?    Answer[facility]   [facility] All questions were answered. The patient knows to call the clinic with any problems, questions or concerns. No barriers to learning was detected. The total time spent in the appointment was 20 minutes.     YaTruitt MerleMD 05/27/2019   I, AmJoslyn Devonam acting as scribe for YaTruitt MerleMD.   I have reviewed the above documentation for accuracy and completeness, and I agree with the above.

## 2019-05-27 ENCOUNTER — Other Ambulatory Visit: Payer: Self-pay

## 2019-05-27 ENCOUNTER — Telehealth: Payer: Self-pay

## 2019-05-27 ENCOUNTER — Inpatient Hospital Stay: Payer: Medicare Other

## 2019-05-27 ENCOUNTER — Inpatient Hospital Stay: Payer: Medicare Other | Attending: Hematology | Admitting: Hematology

## 2019-05-27 ENCOUNTER — Encounter: Payer: Self-pay | Admitting: Hematology

## 2019-05-27 VITALS — BP 173/101 | HR 93 | Temp 98.3°F | Resp 17 | Ht 66.0 in | Wt 138.0 lb

## 2019-05-27 DIAGNOSIS — C221 Intrahepatic bile duct carcinoma: Secondary | ICD-10-CM

## 2019-05-27 DIAGNOSIS — N186 End stage renal disease: Secondary | ICD-10-CM | POA: Insufficient documentation

## 2019-05-27 DIAGNOSIS — I120 Hypertensive chronic kidney disease with stage 5 chronic kidney disease or end stage renal disease: Secondary | ICD-10-CM | POA: Diagnosis not present

## 2019-05-27 DIAGNOSIS — Z452 Encounter for adjustment and management of vascular access device: Secondary | ICD-10-CM | POA: Diagnosis not present

## 2019-05-27 DIAGNOSIS — G620 Drug-induced polyneuropathy: Secondary | ICD-10-CM | POA: Diagnosis not present

## 2019-05-27 DIAGNOSIS — D6481 Anemia due to antineoplastic chemotherapy: Secondary | ICD-10-CM | POA: Insufficient documentation

## 2019-05-27 DIAGNOSIS — I10 Essential (primary) hypertension: Secondary | ICD-10-CM

## 2019-05-27 DIAGNOSIS — Z992 Dependence on renal dialysis: Secondary | ICD-10-CM | POA: Insufficient documentation

## 2019-05-27 DIAGNOSIS — Z95828 Presence of other vascular implants and grafts: Secondary | ICD-10-CM

## 2019-05-27 LAB — CBC WITH DIFFERENTIAL (CANCER CENTER ONLY)
Abs Immature Granulocytes: 0.03 10*3/uL (ref 0.00–0.07)
Basophils Absolute: 0 10*3/uL (ref 0.0–0.1)
Basophils Relative: 0 %
Eosinophils Absolute: 0.4 10*3/uL (ref 0.0–0.5)
Eosinophils Relative: 6 %
HCT: 38.5 % (ref 36.0–46.0)
Hemoglobin: 11.7 g/dL — ABNORMAL LOW (ref 12.0–15.0)
Immature Granulocytes: 1 %
Lymphocytes Relative: 14 %
Lymphs Abs: 0.9 10*3/uL (ref 0.7–4.0)
MCH: 27 pg (ref 26.0–34.0)
MCHC: 30.4 g/dL (ref 30.0–36.0)
MCV: 88.9 fL (ref 80.0–100.0)
Monocytes Absolute: 0.8 10*3/uL (ref 0.1–1.0)
Monocytes Relative: 13 %
Neutro Abs: 4.2 10*3/uL (ref 1.7–7.7)
Neutrophils Relative %: 66 %
Platelet Count: 286 10*3/uL (ref 150–400)
RBC: 4.33 MIL/uL (ref 3.87–5.11)
RDW: 20.1 % — ABNORMAL HIGH (ref 11.5–15.5)
WBC Count: 6.3 10*3/uL (ref 4.0–10.5)
nRBC: 0 % (ref 0.0–0.2)

## 2019-05-27 LAB — CMP (CANCER CENTER ONLY)
ALT: <6 U/L (ref 0–44)
AST: 13 U/L — ABNORMAL LOW (ref 15–41)
Albumin: 3 g/dL — ABNORMAL LOW (ref 3.5–5.0)
Alkaline Phosphatase: 136 U/L — ABNORMAL HIGH (ref 38–126)
Anion gap: 11 (ref 5–15)
BUN: 11 mg/dL (ref 8–23)
CO2: 33 mmol/L — ABNORMAL HIGH (ref 22–32)
Calcium: 8.1 mg/dL — ABNORMAL LOW (ref 8.9–10.3)
Chloride: 98 mmol/L (ref 98–111)
Creatinine: 3.57 mg/dL (ref 0.44–1.00)
GFR, Est AFR Am: 14 mL/min — ABNORMAL LOW (ref >60)
GFR, Estimated: 12 mL/min — ABNORMAL LOW (ref >60)
Glucose, Bld: 119 mg/dL — ABNORMAL HIGH (ref 70–99)
Potassium: 3.3 mmol/L — ABNORMAL LOW (ref 3.5–5.1)
Sodium: 142 mmol/L (ref 135–145)
Total Bilirubin: 0.3 mg/dL (ref 0.3–1.2)
Total Protein: 7.4 g/dL (ref 6.5–8.1)

## 2019-05-27 MED ORDER — SODIUM CHLORIDE 0.9% FLUSH
10.0000 mL | INTRAVENOUS | Status: DC | PRN
  Administered 2019-05-27: 10 mL
  Filled 2019-05-27: qty 10

## 2019-05-27 MED ORDER — HEPARIN SOD (PORK) LOCK FLUSH 100 UNIT/ML IV SOLN
500.0000 [IU] | Freq: Once | INTRAVENOUS | Status: AC | PRN
  Administered 2019-05-27: 500 [IU]
  Filled 2019-05-27: qty 5

## 2019-05-27 NOTE — Patient Instructions (Signed)

## 2019-05-27 NOTE — Telephone Encounter (Signed)
Oral Oncology Patient Advocate Encounter  Met patient in [place] to complete application for [place] Patient Assistance Program in an effort to reduce the patient's out of pocket expense for Lynparza to $0.    Application completed and faxed to [phone].   [place] patient [place] phone number for follow up is [phone].   This encounter will be updated until final determination.   [place] Patient [place] Phone [phone] Fax [phone] 05/27/2019 2:24 PM

## 2019-05-27 NOTE — Progress Notes (Signed)
Critical lab Value:  Creatinine 3.57.  Dr Burr Medico notified

## 2019-05-28 LAB — CANCER ANTIGEN 19-9: CA 19-9: 48 U/mL — ABNORMAL HIGH (ref 0–35)

## 2019-05-30 ENCOUNTER — Telehealth: Payer: Self-pay | Admitting: Hematology

## 2019-05-30 NOTE — Telephone Encounter (Signed)
Scheduled appt per 1/8 los.  Spoke with pt and they are aware of the appt date and time.

## 2019-05-31 NOTE — Telephone Encounter (Signed)
Patient has been approved for manufacturer assistance for [place] through [place] through 05/18/20.  [place] Patient [place] Phone [phone] Fax [phone] 05/31/2019 4:02 PM

## 2019-06-07 ENCOUNTER — Telehealth: Payer: Self-pay | Admitting: *Deleted

## 2019-06-07 NOTE — Telephone Encounter (Signed)
Received call from pt stating that she received her Lynparza yesterday & understands that she is to take twice daily & will start today.  She has f/u appt in 2 weeks.  Encouraged to call with any questions/concerns.  Message routed to Dr Burr Medico.

## 2019-06-09 ENCOUNTER — Ambulatory Visit
Admission: RE | Admit: 2019-06-09 | Discharge: 2019-06-09 | Disposition: A | Discharge status: Home / Self Care | Payer: Medicare Other | Source: Ambulatory Visit | Attending: Hematology | Admitting: Hematology

## 2019-06-09 ENCOUNTER — Encounter: Payer: Self-pay | Admitting: *Deleted

## 2019-06-09 ENCOUNTER — Other Ambulatory Visit: Payer: Self-pay

## 2019-06-09 DIAGNOSIS — C221 Intrahepatic bile duct carcinoma: Secondary | ICD-10-CM

## 2019-06-09 HISTORY — PX: IR RADIOLOGIST EVAL & MGMT: IMG5224

## 2019-06-09 NOTE — Consult Note (Signed)
Chief Complaint: Patient was consulted remotely today (TeleHealth) for cholangiocarcinoma at the request of Feng,Yan.    Referring Physician(s): Feng,Yan  History of Present Illness: Mary Chapman is a 74 y.o. female with a history of cholangiocarcinoma initially detected as a roughly 5 cm heterogeneous low-density mass in the inferior right lobe on a CT without contrast dated 02/23/2018.  CT was also performed with contrast later that same day and CTA performed on 02/25/2018.  Ultrasound-guided biopsy of the mass on 02/27/2018 demonstrated adenocarcinoma.  The mass demonstrated significant increase in size in December, 2019 while the patient was awaiting evaluation for possible surgical resection.  At that time MRI at [facility] demonstrated a 9 cm mass centered in segment V with extension into segments IV and VI and with a 1.7 cm satellite lesion in segment VI.  There were several enlarged probable metastatic portal lymph nodes present.  The mass was determined to be not surgically resectable.  Mary Chapman began chemotherapy in December, 2019 receiving gemcitabine and cisplatin.  The primary tumor and metastatic lymphadenopathy decreased significantly in size initially with chemotherapy.  She did have some neuropathy with chemotherapy.  She did have disease progression evident by imaging on 02/22/2019 and was started on FOLFOX.  She had further progression by CT on 05/02/2019 with dominant mass enlargement to roughly 9 x 6 cm compared to 7 x 5 cm previously.  She did have BRCA1 mutation of her tumor and was started on Lynparza twice daily on 06/07/2019.  She currently has some right-sided abdominal pain rated as up to 6/10 in intensity.  She takes approximately 10 mg of oxycodone 3 times a day which helps with pain.  She does have decrease in energy.  She lives alone but is independent in activities of daily living and is able to cook and clean for herself.  She does not spend any excessive time in bed.  She  has been on chronic hemodialysis for the last year and a half and dialyzes in [employer] (MWF).  Her primary nephrologist is Dr. Augustin Coupe.  Past Medical History:  Diagnosis Date   Diverticulitis    Focal segmental glomerulosclerosis    ESRD on MWF HD   Hypertension    intrahepatic cholangio ca dx'd10/2019   Renal insufficiency    dialysis pt MWF   Tobacco abuse     Past Surgical History:  Procedure Laterality Date   AV FISTULA PLACEMENT Left 03/18/2017   Procedure: Brachiocephalic ARTERIOVENOUS (AV) FISTULA CREATION left arm;  Surgeon: Elam Dutch, MD;  Location: [place];  Service: Vascular;  Laterality: Left;   BIOPSY  03/05/2018   Procedure: BIOPSY;  Surgeon: Irving Copas., MD;  Location: [facility] ENDOSCOPY;  Service: Gastroenterology;;  enteroscopy bx /cytology brushing Greig Castilla bx   COLONOSCOPY WITH PROPOFOL N/A 03/05/2018   Procedure: COLONOSCOPY WITH PROPOFOL;  Surgeon: Irving Copas., MD;  Location: [place];  Service: Gastroenterology;  Laterality: N/A;  Bx, Spot   ENTEROSCOPY N/A 03/05/2018   Procedure: ENTEROSCOPY;  Surgeon: Mansouraty, Telford Nab., MD;  Location: [facility] ENDOSCOPY;  Service: Gastroenterology;  Laterality: N/A;  BX, Brushing, & Spot   INSERTION OF DIALYSIS CATHETER N/A 03/18/2017   Procedure: INSERTION OF DIALYSIS CATHETER;  Surgeon: Elam Dutch, MD;  Location: [place];  Service: Vascular;  Laterality: N/A;   IR GENERIC HISTORICAL  08/15/2016   IR Mary GUIDE VASC ACCESS RIGHT 08/15/2016 Arne Cleveland, MD [facility]-INTER[facility] RAD   IR GENERIC HISTORICAL  08/15/2016   IR FLUORO GUIDE  CV LINE RIGHT 08/15/2016 Arne Cleveland, MD [facility]   IR IMAGING GUIDED PORT INSERTION  05/06/2018   IR RADIOLOGIST EVAL & MGMT  06/09/2019   SUBMUCOSAL INJECTION  03/05/2018   Procedure: SUBMUCOSAL INJECTION;  Surgeon: Irving Copas., MD;  Location: [facility] ENDOSCOPY;  Service: Gastroenterology;;  spot tatoo   TUBAL LIGATION      Allergies: Patient  has no known allergies.  Medications: Prior to Admission medications   Medication Sig Start Date End Date Taking? Authorizing Provider  amLODipine (NORVASC) 10 MG tablet Take 10 mg by mouth at bedtime.  09/15/18   [provider]  folic acid (FOLVITE) 1 MG tablet Take 1 tablet (1 mg total) by mouth daily. 04/16/18   Kayleen Memos, DO  gabapentin (NEURONTIN) 100 MG capsule Take 1 capsule (100 mg total) by mouth at bedtime as needed (pain). 04/25/19   Geradine Girt, DO  metoprolol tartrate (LOPRESSOR) 25 MG tablet Take 0.5 tablets (12.5 mg total) by mouth 2 (two) times daily. 04/16/18   Kayleen Memos, DO  multivitamin (RENA-VIT) TABS tablet Take 1 tablet by mouth 3 (three) times a week. 04/25/19   Geradine Girt, DO  Nutritional Supplements (FEEDING SUPPLEMENT, NEPRO CARB STEADY,) LIQD Take 237 mLs by mouth 3 (three) times daily as needed (Supplement). 03/06/18   Ghimire, Henreitta Leber, MD  olaparib (LYNPARZA) 100 MG tablet Take 1 tablet (100 mg total) by mouth 2 (two) times daily. Swallow whole. May take with food to decrease nausea and vomiting. 05/09/19   Truitt Merle, MD  oxycodone (OXY-IR) 5 MG capsule Take 10 mg by mouth every 6 (six) hours as needed.    [provider]  pantoprazole (PROTONIX) 40 MG tablet Take 1 tablet (40 mg total) by mouth 2 (two) times daily. 05/09/19   Truitt Merle, MD  polyethylene glycol [facility]) packet Take 17 g by mouth daily. Patient taking differently: Take 17 g by mouth daily as needed for mild constipation.  03/06/18   Ghimire, Henreitta Leber, MD  senna-docusate (SENOKOT-S) 8.6-50 MG tablet Take 2 tablets by mouth 2 (two) times daily. Patient taking differently: Take 2 tablets by mouth at bedtime as needed for mild constipation.  04/16/18   Kayleen Memos, DO  prochlorperazine (COMPAZINE) 10 MG tablet Take 1 tablet (10 mg total) by mouth every 6 (six) hours as needed (Nausea or vomiting). 05/10/18 02/22/19  Truitt Merle, MD     Family History  Problem  Relation Age of Onset   Hypertension Mother    Diabetes Mother    Hypertension Father    Diabetes Father    Hypertension Sister    Hypertension Brother     Social History   Socioeconomic History   Marital status: Widowed    Spouse name: Not on file   Number of children: Not on file   Years of education: Not on file   Highest education level: Not on file  Occupational History   Occupation: retired  Tobacco Use   Smoking status: Current Some Day Smoker    Packs/day: 0.25    Years: 20.00    Pack years: 5.00    Types: Cigarettes   Smokeless tobacco: Never Used   Tobacco comment: 3 cigs/day  Substance and Sexual Activity   Alcohol use: No    Comment: moderate drinker for 10 years, quit in 30 years   Drug use: No   Sexual activity: Not Currently    Birth control/protection: Post-menopausal  Other Topics Concern  Not on file  Social History Narrative   Not on file   Social Determinants of Health   Financial Resource Strain:    Difficulty of Paying Living Expenses: Not on file  Food Insecurity:    Worried About Normal in the Last Year: Not on file   Ran Out of Food in the Last Year: Not on file  Transportation Needs:    Lack of Transportation (Medical): Not on file   Lack of Transportation (Non-Medical): Not on file  Physical Activity:    Days of Exercise per Week: Not on file   Minutes of Exercise per Session: Not on file  Stress:    Feeling of Stress : Not on file  Social Connections:    Frequency of Communication with Friends and Family: Not on file   Frequency of Social Gatherings with Friends and Family: Not on file   Attends Religious Services: Not on file   Active Member of Clubs or Organizations: Not on file   Attends Archivist Meetings: Not on file   Marital Status: Not on file    ECOG Status: 1 - Symptomatic but completely ambulatory  Review of Systems  Constitutional: Positive for fatigue.  Negative for activity change, appetite change, chills and fever.  HENT: Negative.   Respiratory: Negative.   Cardiovascular: Negative.   Gastrointestinal: Positive for abdominal pain. Negative for blood in stool, constipation, diarrhea, nausea and vomiting.  Genitourinary: Negative.   Musculoskeletal: Negative.   Skin: Negative.   Neurological: Negative.   Hematological: Negative.     Review of Systems: A 12 point ROS discussed and pertinent positives are indicated in the HPI above.  All other systems are negative.  Physical Exam No direct physical exam was performed (except for noted visual exam findings with Video Visits).   Vital Signs: There were no vitals taken for this visit.  Imaging: IR Radiologist Eval & Mgmt  Result Date: 06/09/2019 Please refer to notes tab for details about interventional procedure. (Op Note)   Labs:  CBC: Recent Labs    04/24/19 0346 04/25/19 0729 05/05/19 1030 05/27/19 1432  WBC 4.8 4.0 6.2 6.3  HGB 8.8* 8.7* 9.1* 11.7*  HCT 29.1* 28.8* 31.2* 38.5  PLT 179 182 248 286    COAGS: No results for input(s): INR, APTT in the last 8760 hours.  BMP: Recent Labs    04/24/19 0346 04/25/19 0728 05/05/19 1030 05/27/19 1432  NA 138 139 142 142  K 4.0 4.4 3.9 3.3*  CL 98 101 104 98  CO2 28 27 26  33*  GLUCOSE 93 83 85 119*  BUN 10 33* 27* 11  CALCIUM 7.7* 7.0* 7.6* 8.1*  CREATININE 4.19* 7.25* 6.16* 3.57*  GFRNONAA 10* 5* 6* 12*  GFRAA 11* 6* 7* 14*    LIVER FUNCTION TESTS: Recent Labs    04/21/19 1042 04/21/19 1042 04/24/19 0346 04/25/19 0728 05/05/19 1030 05/27/19 1432  BILITOT 0.4  --  0.5  --  0.3 0.3  AST 15  --  15  --  17 13*  ALT <6  --  10  --  <6 <6  ALKPHOS 110  --  97  --  108 136*  PROT 6.6  --  6.1*  --  6.7 7.4  ALBUMIN 2.9*   < > 2.5* 2.3* 2.8* 3.0*   < > = values in this interval not displayed.    Assessment and Plan:  I met with Mary Chapman via videoconferencing.  We reviewed  recent imaging studies  demonstrating progression of cholangiocarcinoma primarily centered in segment V in the right lobe of the liver.  Additional satellite mass in the inferior aspect of segment VI.  There may be some extension into segment IVb, but the majority of tumor is in the distribution of right hepatic arterial supply.  By prior CTA, the right hepatic artery is replaced off of the SMA and appeared to be the predominant arterial supply to the tumor in 2019.  The left lobe of the liver is small and the left hepatic artery emanates off of the celiac axis.  Mary Chapman has just begun immunotherapy with [facility]ie [place] and has tolerated the first few doses without side effects.  She has a follow-up in a couple months with Dr. Burr Medico.  I did discuss liver directed therapy options with her should the new immunotherapy not help to shrink tumor.  We discussed possible radioembolization with Y-90 microspheres.  Although there is not as much data in the treatment of cholangiocarcinoma with Y-90 compared to hepatocellular carcinoma and metastatic colon carcinoma and neuroendocrine carcinoma, there is still definite potential for radioembolization to help in reducing tumor bulk and improving quality of life and survival.  Contrast would have to be utilized for arteriography prior to embolization and during treatment.  She has received iodinated contrast previously and has been on hemodialysis long enough that she likely has no meaningful recoverable renal function.    We discussed details of Y-90 treatment.  She is at slightly increased risk of infectious complication following radioembolization due to chronic hemodialysis.  She would likely benefit from some additional antibiotic coverage after radioembolization to reduce risk of infection in treated tissue.  I told Mary Chapman that I would coordinate with Dr. Burr Medico regarding when to assess for response to her new immunotherapy.  If she does not respond to [place], we will begin the scheduling  process for Y-90 radioembolization.  She is definitely interested in pursuing additional liver directed therapy in the future should she not show response to immunotherapy.  Thank you for this interesting consult.  I greatly enjoyed meeting Mary Chapman and look forward to participating in their care.  A copy of this report was sent to the requesting provider on this date.  Electronically Signed: Azzie Roup 06/09/2019, 1:31 PM     I spent a total of 30 Minutes  in remote  clinical consultation, greater than 50% of which was counseling/coordinating care for treatment of cholangiocarcinoma.    Visit type: Audio and video (Webex).   Alternative for in-person consultation at [facility], [address], [place], [place]. This visit type was conducted due to national recommendations for restrictions regarding the COVID-19 Pandemic (e.g. social distancing).  This format is felt to be most appropriate for this patient at this time.  All issues noted in this document were discussed and addressed.

## 2019-06-15 NOTE — Progress Notes (Signed)
[name]   [phone] [phone]   Clinic Follow up Note   Patient Care Team: Default, Provider, MD as PCP - General [name], MD as PCP - Cardiology (Cardiology) Dwana Melena, MD as Attending Physician (Nephrology) Truitt Merle, MD as Consulting Physician (Hematology)  Date of Service:  06/20/2019  CHIEF COMPLAINT: f/u intrahepatic cholangiocarcinoma  SUMMARY OF ONCOLOGIC HISTORY: Oncology History  Intrahepatic cholangiocarcinoma ([place])  02/23/2018 Imaging   CT AP W Contrast 02/23/18  IMPRESSION: 1. Large multi-cystic lesion in the central aspect of the liver adjacent to the gallbladder fossa, with several smaller satellite lesions. These are all new compared to prior CT the chest, abdomen and pelvis 08/07/2016, concerning for intrahepatic abscesses. 2. Extensive mural thickening and inflammatory changes in the region of the cecum. This is nonspecific, and could reflect either right-sided diverticulitis, focal area of colitis, or potentially even underlying neoplasm. 3. Cholelithiasis. Gallbladder is nearly completely contracted without surrounding inflammatory changes to suggest an acute cholecystitis at this time. 4. Left-sided nephrolithiasis measuring up to 8 mm in the upper pole collecting system of left kidney. No ureteral stones or findings of urinary tract obstruction. 5. Aortic atherosclerosis.   02/27/2018 Initial Biopsy   Diagnosis 02/27/18  Liver, needle/core biopsy, Right Hepatic Lobe - ADENOCARCINOMA. SEE NOTE.   02/27/2018 Miscellaneous     03/05/2018 Procedure   Colonoscopy 03/05/18  IMPRESSION - Preparation of the colon was fair. - Non-thrombosed external hemorrhoids found on digital rectal exam. - There was significant looping of the colon. - Severe diverticulosis in the recto-sigmoid colon, in the sigmoid colon, in the descending colon, in the transverse colon, at the hepatic flexure, in the ascending colon  and in the cecum. There was no evidence of diverticular bleeding. - A single (solitary) ulcer in the cecum - highly concerning for underlying malignancy. Biopsied. Phlegmonous change is possible, though often in setting of complicated diverticulosis/diverticulitis ulceration would not be as common. - Erythematous mucosa in the transverse colon, at the hepatic flexure, in the ascending colon and in the cecum. Biopsied. - Normal mucosa in the rectum, in the recto-sigmoid colon, in the sigmoid colon and in the descending colon. Biopsied. - Non-bleeding non-thrombosed external and internal hemorrhoids. -----Negative for malignancy    03/11/2018 Initial Diagnosis   Intrahepatic cholangiocarcinoma ([place])   04/02/2018 Imaging   CT CAP W contrast 04/02/18  IMPRESSION: 1. 4 mm subpleural nodule in the periphery of the right lower lobe. This is nonspecific, but strongly favored to represent a benign subpleural lymph node. Attention on follow-up studies is recommended to exclude the possibility of metastatic disease. 2. Diffuse bronchial wall thickening with moderate centrilobular and mild paraseptal emphysema; imaging findings suggestive of underlying COPD. 3. Aortic atherosclerosis, in addition to left main and left anterior descending coronary artery disease. Assessment for potential risk factor modification, dietary therapy or pharmacologic therapy may be warranted, if clinically indicated. 4. Nonobstructive calculi in the upper pole collecting system of left kidney measuring up to 7 mm.    04/27/2018 Cancer Staging   Staging form: Intrahepatic Bile Duct, AJCC 8th Edition - Clinical: Stage IIIB (cT2, cN1, cM0) - Signed by Truitt Merle, MD on 04/27/2018   05/18/2018 - 02/10/2019 Chemotherapy   Gemcitabine and Cisplatin every 2 weeks starting on 05/18/2018, with 50% dose reduction. D/c after 02/10/19 due to disease progression.     07/19/2018 Imaging   CT CAP W CONTRAST  IMPRESSION: 1.  Dominant inferior liver mass is decreased in size.  Two subcentimeter clustered low-attenuation lesions in the far inferior right liver lobe, not discretely visualized on the prior noncontrast CT study, can not exclude new small satellite hepatic metastases. 2. Porta hepatis adenopathy is mildly decreased. 3. Small right pulmonary nodules are stable. 4. No additional potential new or progressive metastatic disease. 5. Small pericardial effusion, slightly increased. 6. Cholelithiasis. 7. Moderate diffuse colonic diverticulosis. 8. Aortic Atherosclerosis (ICD10-I70.0) and Emphysema (ICD10-J43.9).    11/02/2018 Imaging   CT CAP WO contrast  IMPRESSION: 1. The dominant right hepatic lobe mass and most of the smaller right hepatic lobe lesions are stable. One of the right hepatic lobe lesions appears to have increased in size, formerly about 1.1 by 1.2 cm and currently 1.9 by 1.5 cm. 2. Two small right lower lobe pulmonary nodules are stable in size. 3. A hypodense right thyroid nodule has enlarged, currently 1.9 by 1.2 cm and formerly 1.1 by 0.7 cm. Consider thyroid ultrasound for further evaluation. 4. Other imaging findings of potential clinical significance: Aortic Atherosclerosis (ICD10-I70.0) and Emphysema (ICD10-J43.9). Coronary atherosclerosis. Mild cardiomegaly. Low-density blood pool suggests anemia. Nonobstructive left nephrolithiasis. Colonic diverticulosis.   02/22/2019 Imaging   CT CAP WO Contrast  IMPRESSION: 1. Interval increase in size of 2 now dominant hepatic lesions consistent with disease progression. Assessment of liver parenchyma limited by noncontrast study. 2. Stable right lower lobe pulmonary nodule. 2nd pulmonary nodule seen previously not identified today. 3. Stable 20 mm right thyroid nodule. 4.  Emphysema. (ICD10-J43.9) 5.  Aortic Atherosclerois (ICD10-170.0)   02/24/2019 - 04/21/2019 Chemotherapy   Second-line FOLFOX  with dose reduction starting  02/24/19. D/c due to disease progression in Liver. Last dose 04/21/19.    05/02/2019 Imaging   CT CAP WO Contrast IMPRESSION: 1. Continued mild progression of the dominant central and smaller inferior right liver lesions. 2. Haziness and nodularity in the right omentum, adjacent to the inferior right liver is stable. 3. Tiny subpleural nodule right lower lobe is stable. 4. Stable 2 cm right thyroid nodule. Continued attention on follow-up recommended. 5.  Emphysema. (ICD10-J43.9) 6.  Aortic Atherosclerois (ICD10-170.0)   06/07/2019 -  Chemotherapy   Oral Lynparza 119m BID starting on 06/07/19      CURRENT THERAPY:  Oral Lynparza 1043mBID starting on 06/07/19  INTERVAL HISTORY:  AgCorlette Chapman here for a follow up. She presents to the clinic alone. She notes she is doing well. She denies issues with LyLonie Peako far. She notes her abdominal pain is increasing to 5/10. She has been taking 1 tab of 1058mxycodone daily. She notes her dialysis is going well. She denies cough, SOB of chest issues. She has been taking appetite stimulant. She notes she tries to eat but still loosing weight. She also takes 2 ensure a day.  [place] She plans to get second vaccination soon.    REVIEW OF SYSTEMS:   Constitutional: Denies fevers, chills (+) Mild weight loss  Eyes: Denies blurriness of vision Ears, nose, mouth, throat, and face: Denies mucositis or sore throat Respiratory: Denies cough, dyspnea or wheezes Cardiovascular: Denies palpitation, chest discomfort or lower extremity swelling Gastrointestinal:  Denies nausea, heartburn or change in bowel habits (+) increasing right abdominal pain  Skin: Denies abnormal skin rashes Lymphatics: Denies new lymphadenopathy or easy bruising Neurological:Denies numbness, tingling or new weaknesses Behavioral/Psych: Mood is stable, no new changes  All other systems were reviewed with the patient and are  negative.  MEDICAL HISTORY:  Past Medical History:  Diagnosis Date   Diverticulitis    Focal segmental glomerulosclerosis    ESRD on MWF HD   Hypertension    intrahepatic cholangio ca dx'd10/2019   Renal insufficiency    dialysis pt MWF   Tobacco abuse     SURGICAL HISTORY: Past Surgical History:  Procedure Laterality Date   AV FISTULA PLACEMENT Left 03/18/2017   Procedure: Brachiocephalic ARTERIOVENOUS (AV) FISTULA CREATION left arm;  Surgeon: Elam Dutch, MD;  Location: [place];  Service: Vascular;  Laterality: Left;   BIOPSY  03/05/2018   Procedure: BIOPSY;  Surgeon: Irving Copas., MD;  Location: [facility] ENDOSCOPY;  Service: Gastroenterology;;  enteroscopy bx /cytology brushing Greig Castilla bx   COLONOSCOPY WITH PROPOFOL N/A 03/05/2018   Procedure: COLONOSCOPY WITH PROPOFOL;  Surgeon: Irving Copas., MD;  Location: [place];  Service: Gastroenterology;  Laterality: N/A;  Bx, Spot   ENTEROSCOPY N/A 03/05/2018   Procedure: ENTEROSCOPY;  Surgeon: Mansouraty, Telford Nab., MD;  Location: [facility] ENDOSCOPY;  Service: Gastroenterology;  Laterality: N/A;  BX, Brushing, & Spot   INSERTION OF DIALYSIS CATHETER N/A 03/18/2017   Procedure: INSERTION OF DIALYSIS CATHETER;  Surgeon: Elam Dutch, MD;  Location: [place];  Service: Vascular;  Laterality: N/A;   IR GENERIC HISTORICAL  08/15/2016   IR US GUIDE VASC ACCESS RIGHT 08/15/2016 Arne Cleveland, MD [facility]INTER[facility]   IR GENERIC HISTORICAL  08/15/2016   IR FLUORO GUIDE CV LINE RIGHT 08/15/2016 Arne Cleveland, MD [facility]   IR IMAGING GUIDED PORT INSERTION  05/06/2018   IR RADIOLOGIST EVAL & MGMT  06/09/2019   SUBMUCOSAL INJECTION  03/05/2018   Procedure: SUBMUCOSAL INJECTION;  Surgeon: Irving Copas., MD;  Location: [facility] ENDOSCOPY;  Service: Gastroenterology;;  spot tatoo   TUBAL LIGATION      I have reviewed the social history and family history with the patient and they are unchanged from previous  note.  ALLERGIES:  has No Known Allergies.  MEDICATIONS:  Current Outpatient Medications  Medication Sig Dispense Refill   amLODipine (NORVASC) 10 MG tablet Take 10 mg by mouth at bedtime.      folic acid (FOLVITE) 1 MG tablet Take 1 tablet (1 mg total) by mouth daily. 30 tablet 0   gabapentin (NEURONTIN) 100 MG capsule Take 1 capsule (100 mg total) by mouth at bedtime as needed (pain).     metoprolol tartrate (LOPRESSOR) 25 MG tablet Take 0.5 tablets (12.5 mg total) by mouth 2 (two) times daily. 30 tablet 0   multivitamin (RENA-VIT) TABS tablet Take 1 tablet by mouth 3 (three) times a week.     Nutritional Supplements (FEEDING SUPPLEMENT, NEPRO CARB STEADY,) LIQD Take 237 mLs by mouth 3 (three) times daily as needed (Supplement). 90 Can 0   olaparib (LYNPARZA) 100 MG tablet Take 1 tablet (100 mg total) by mouth 2 (two) times daily. Swallow whole. May take with food to decrease nausea and vomiting. 60 tablet 0   oxycodone (OXY-IR) 5 MG capsule Take 2 capsules (10 mg total) by mouth every 8 (eight) hours as needed. 60 capsule 0   pantoprazole (PROTONIX) 40 MG tablet Take 1 tablet (40 mg total) by mouth 2 (two) times daily. 60 tablet 2   polyethylene glycol (MIRALAX) packet Take 17 g by mouth daily. (Patient taking differently: Take 17 g by mouth daily as needed for mild constipation. ) 30 each 0   senna-docusate (SENOKOT-S) 8.6-50 MG tablet Take 2 tablets by mouth 2 (two) times daily. (Patient taking differently: Take 2 tablets  by mouth at bedtime as needed for mild constipation. ) 30 tablet 0   No current facility-administered medications for this visit.    PHYSICAL EXAMINATION: ECOG PERFORMANCE STATUS: 2 - Symptomatic, <50% confined to bed  Vitals:   06/20/19 1324  BP: (!) 158/95  Pulse: 85  Resp: 17  Temp: (!) 97.5 F (36.4 C)  SpO2: 98%   Filed Weights   06/20/19 1324  Weight: 133 lb 12.8 oz (60.7 kg)    GENERAL:alert, no distress and comfortable SKIN: skin  color, texture, turgor are normal, no rashes or significant lesions EYES: normal, Conjunctiva are pink and non-injected, sclera clear  NECK: supple, thyroid normal size, non-tender, without nodularity LYMPH:  no palpable lymphadenopathy in the cervical, axillary  LUNGS: clear to auscultation and percussion with normal breathing effort HEART: regular rate & rhythm and no murmurs and no lower extremity edema ABDOMEN:abdomen soft, non-tender and normal bowel sounds (+) Hepatomegaly, palpable 8cm below ribcage, tender Musculoskeletal:no cyanosis of digits and no clubbing  NEURO: alert & oriented x 3 with fluent speech, no focal motor/sensory deficits  LABORATORY DATA:  I have reviewed the data as listed CBC Latest Ref Rng & Units 06/20/2019 05/27/2019 05/05/2019  WBC 4.0 - 10.5 K/uL 6.1 6.3 6.2  Hemoglobin 12.0 - 15.0 g/dL 12.5 11.7(L) 9.1(L)  Hematocrit 36.0 - 46.0 % 42.3 38.5 31.2(L)  Platelets 150 - 400 K/uL 257 286 248     CMP Latest Ref Rng & Units 06/20/2019 05/27/2019 05/05/2019  Glucose 70 - 99 mg/dL 95 119(H) 85  BUN 8 - 23 mg/dL 22 11 27(H)  Creatinine 0.44 - 1.00 mg/dL 4.71(HH) 3.57(HH) 6.16(HH)  Sodium 135 - 145 mmol/L 139 142 142  Potassium 3.5 - 5.1 mmol/L 3.7 3.3(L) 3.9  Chloride 98 - 111 mmol/L 98 98 104  CO2 22 - 32 mmol/L 29 33(H) 26  Calcium 8.9 - 10.3 mg/dL 8.4(L) 8.1(L) 7.6(L)  Total Protein 6.5 - 8.1 g/dL 7.6 7.4 6.7  Total Bilirubin 0.3 - 1.2 mg/dL 0.4 0.3 0.3  Alkaline Phos 38 - 126 U/L 117 136(H) 108  AST 15 - 41 U/L 12(L) 13(L) 17  ALT 0 - 44 U/L 6 <6 <6      RADIOGRAPHIC STUDIES: I have personally reviewed the radiological images as listed and agreed with the findings in the report. No results found.   ASSESSMENT & PLAN:  Madeeha Costantino is a 74 y.o. female with   1. Adenocarcinomainliver, likelyintrahepatic cholangiocarcinoma, cT2N1M0 stage IIIB, unresectable, MS-Stable -Diagnosed in 02/2018. She is not eligible for surgery due to tumor size and  location.She was seen by Dr. Cloyd Stagers at [facility].  -She was initiallytreated with first-lineCisplatin/Gemcitabineq2weekssince 12/2019but unfortunately had disease progression as seen on 02/2019 CT CAP.  -She was treated with second-line FOLFOX with dose reduction every 2 weeks on 02/24/19, unfortunately she recently had disease progressed based on 05/02/19 CT CAP.  -Her FO results show BRCA1 mutation of her tumor which makes her eligible for PARP2 inhibitorLynparza ( olaparib ). I started her on Lynparza 118m BID on 06/07/19. Dose reduced due to her ESRD. She has been tolerating well with no major issues.  -She has consulted with Dr. YKathlene Coteand he offered target liver therapy if she does not respond to current regimen. She is interested.  -I plan to scan her in 2-3 months. Will scan her sooner if pain significantly worsen or tumor marker rises -Labs reviewed and adequate to continue Lynparza 1055mBID.  -f/u in 3-4 weeks  -She notes she  took her first [place] vaccination on 1/25. Will receive second soon.    2. Right abdominal pain  -Secondary to #1  -Her 05/02/19 CT scan shows disease progression in liver. Her pain is intermitted but continues to increase and continues to have hepatomegaly on exam. -She is currently taking Oxycodone30monce daily. I discussed with recent increasing pain she can increase up to 3 tabs a day. I refilled today (06/20/19) -She knows to take Miralax as needed to combat constipation from pain meds.  -She has hepatomegaly on exam today. I encouraged her to watch for jaundice of eyes and dark urine.   3. HTN  -Currently on metoprololand amlodipine  -Her BP remains consistently high. Ihave spokenwithher nephrologistaboutadjustingher medications to better control her HTN.  3. End Stage Renal Disease, on HD MWF -Treated with dialysis, tolerating well. Continue to f/u with nephrologist Dr. LAugustin Coupe-Given chemo pump D/c is on every other Saturday  Ipreviouslyrecommendedshe change her Dialysis timeto later on Saturdays, she is agreeable.  4. Anemia of chronic diseaseand chemo induced anemia, secondary to CKD and Malignancy -She has receivedblood transfusion intermittently  -Recentlyrestartedher onEPO at her dialysis center due to her worsening anemia  -Normal Hg today (06/20/19). Given she is no longer on chemo, I discussed her EPO injections will only be given if Hg less than 11.   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 -Controlled on Gabapentin1026mat night 2-3 times a week.  6.Goal of care discussion, DNR/DNI -We previously discussed the incurable nature of her cancer, and the overall poor prognosis, especially if she does not have good response tolimitedchemotherapyoptionsor progress on chemo -The patient understands the goal of care is palliative. -I recommend DNR/DNI, sheagreed with DNR/DNI. She plans to make her daughter LaWonda Amis  Plan -Labs reviewed and adequate to continue Lynparza 10062mID, tolerating well.  -Lab and f/u in 3-4 weeks    No problem-specific Assessment & Plan notes found for this encounter.   No orders of the defined types were placed in this encounter.  All questions were answered. The patient knows to call the clinic with any problems, questions or concerns. No barriers to learning was detected. The total time spent in the appointment was 30 minutes.     YanTruitt MerleD 06/20/2019   I, AmoJoslyn Devonm acting as scribe for YanTruitt MerleD.   I have reviewed the above documentation for accuracy and completeness, and I agree with the above.

## 2019-06-20 ENCOUNTER — Inpatient Hospital Stay: Payer: Medicare Other | Attending: Hematology | Admitting: Hematology

## 2019-06-20 ENCOUNTER — Inpatient Hospital Stay: Payer: Medicare Other

## 2019-06-20 ENCOUNTER — Encounter: Payer: Self-pay | Admitting: Hematology

## 2019-06-20 ENCOUNTER — Other Ambulatory Visit: Payer: Self-pay

## 2019-06-20 ENCOUNTER — Telehealth: Payer: Self-pay | Admitting: *Deleted

## 2019-06-20 VITALS — BP 158/95 | HR 85 | Temp 97.5°F | Resp 17 | Ht 66.0 in | Wt 133.8 lb

## 2019-06-20 DIAGNOSIS — Z95828 Presence of other vascular implants and grafts: Secondary | ICD-10-CM

## 2019-06-20 DIAGNOSIS — C221 Intrahepatic bile duct carcinoma: Secondary | ICD-10-CM

## 2019-06-20 DIAGNOSIS — Z452 Encounter for adjustment and management of vascular access device: Secondary | ICD-10-CM | POA: Diagnosis not present

## 2019-06-20 DIAGNOSIS — I120 Hypertensive chronic kidney disease with stage 5 chronic kidney disease or end stage renal disease: Secondary | ICD-10-CM | POA: Insufficient documentation

## 2019-06-20 DIAGNOSIS — N186 End stage renal disease: Secondary | ICD-10-CM | POA: Diagnosis not present

## 2019-06-20 DIAGNOSIS — Z992 Dependence on renal dialysis: Secondary | ICD-10-CM | POA: Diagnosis not present

## 2019-06-20 DIAGNOSIS — G620 Drug-induced polyneuropathy: Secondary | ICD-10-CM | POA: Diagnosis not present

## 2019-06-20 DIAGNOSIS — I10 Essential (primary) hypertension: Secondary | ICD-10-CM | POA: Diagnosis not present

## 2019-06-20 DIAGNOSIS — D631 Anemia in chronic kidney disease: Secondary | ICD-10-CM | POA: Insufficient documentation

## 2019-06-20 LAB — CMP (CANCER CENTER ONLY)
ALT: 6 U/L (ref 0–44)
AST: 12 U/L — ABNORMAL LOW (ref 15–41)
Albumin: 2.9 g/dL — ABNORMAL LOW (ref 3.5–5.0)
Alkaline Phosphatase: 117 U/L (ref 38–126)
Anion gap: 12 (ref 5–15)
BUN: 22 mg/dL (ref 8–23)
CO2: 29 mmol/L (ref 22–32)
Calcium: 8.4 mg/dL — ABNORMAL LOW (ref 8.9–10.3)
Chloride: 98 mmol/L (ref 98–111)
Creatinine: 4.71 mg/dL (ref 0.44–1.00)
GFR, Est AFR Am: 10 mL/min — ABNORMAL LOW (ref >60)
GFR, Estimated: 9 mL/min — ABNORMAL LOW (ref >60)
Glucose, Bld: 95 mg/dL (ref 70–99)
Potassium: 3.7 mmol/L (ref 3.5–5.1)
Sodium: 139 mmol/L (ref 135–145)
Total Bilirubin: 0.4 mg/dL (ref 0.3–1.2)
Total Protein: 7.6 g/dL (ref 6.5–8.1)

## 2019-06-20 LAB — CBC WITH DIFFERENTIAL (CANCER CENTER ONLY)
Abs Immature Granulocytes: 0.02 10*3/uL (ref 0.00–0.07)
Basophils Absolute: 0 10*3/uL (ref 0.0–0.1)
Basophils Relative: 0 %
Eosinophils Absolute: 0.4 10*3/uL (ref 0.0–0.5)
Eosinophils Relative: 6 %
HCT: 42.3 % (ref 36.0–46.0)
Hemoglobin: 12.5 g/dL (ref 12.0–15.0)
Immature Granulocytes: 0 %
Lymphocytes Relative: 16 %
Lymphs Abs: 1 10*3/uL (ref 0.7–4.0)
MCH: 25.7 pg — ABNORMAL LOW (ref 26.0–34.0)
MCHC: 29.6 g/dL — ABNORMAL LOW (ref 30.0–36.0)
MCV: 87 fL (ref 80.0–100.0)
Monocytes Absolute: 0.9 10*3/uL (ref 0.1–1.0)
Monocytes Relative: 14 %
Neutro Abs: 3.8 10*3/uL (ref 1.7–7.7)
Neutrophils Relative %: 64 %
Platelet Count: 257 10*3/uL (ref 150–400)
RBC: 4.86 MIL/uL (ref 3.87–5.11)
RDW: 20.7 % — ABNORMAL HIGH (ref 11.5–15.5)
WBC Count: 6.1 10*3/uL (ref 4.0–10.5)
nRBC: 0 % (ref 0.0–0.2)

## 2019-06-20 MED ORDER — SODIUM CHLORIDE 0.9% FLUSH
10.0000 mL | INTRAVENOUS | Status: DC | PRN
  Administered 2019-06-20: 10 mL
  Filled 2019-06-20: qty 10

## 2019-06-20 MED ORDER — OXYCODONE HCL 5 MG PO CAPS: 10.0000 mg | ORAL_CAPSULE | Freq: Three times a day (TID) | ORAL | 0 refills | Status: DC | PRN

## 2019-06-20 MED ORDER — HEPARIN SOD (PORK) LOCK FLUSH 100 UNIT/ML IV SOLN
500.0000 [IU] | Freq: Once | INTRAVENOUS | Status: AC | PRN
  Administered 2019-06-20: 500 [IU]
  Filled 2019-06-20: qty 5

## 2019-06-20 NOTE — Progress Notes (Signed)
Message from [facility] stating that patients creatine 4.71 Dr Burr Medico made aware

## 2019-06-20 NOTE — Telephone Encounter (Signed)
CRITICAL VALUE STICKER CRITICAL VALUE: Creatinine 4.71 DATE & TIME NOTIFIED: 06/20/19, 1407 MESSENGER (representative from lab): Mila Palmer MD NOTIFIED: Dr. Burr Medico TIME OF NOTIFICATION: [id number] RESPONSE: acknowledged

## 2019-06-22 ENCOUNTER — Telehealth: Payer: Self-pay | Admitting: Hematology

## 2019-06-22 NOTE — Telephone Encounter (Signed)
Scheduled appt per 2/1 los.  Spoke with pt and she is aware of the appt date and time

## 2019-07-08 NOTE — Progress Notes (Signed)
Mary Chapman   [phone] [phone]   Clinic Follow up Note   Patient Care Team: Default, Provider, MD as PCP - General [name], MD as PCP - Cardiology (Cardiology) Dwana Melena, MD as Attending Physician (Nephrology) Truitt Merle, MD as Consulting Physician (Hematology)  Date of Service:  07/13/2019  CHIEF COMPLAINT: f/u intrahepatic cholangiocarcinoma  SUMMARY OF ONCOLOGIC HISTORY: Oncology History  Intrahepatic cholangiocarcinoma ([place])  02/23/2018 Imaging   CT AP W Contrast 02/23/18  IMPRESSION: 1. Large multi-cystic lesion in the central aspect of the liver adjacent to the gallbladder fossa, with several smaller satellite lesions. These are all new compared to prior CT the chest, abdomen and pelvis 08/07/2016, concerning for intrahepatic abscesses. 2. Extensive mural thickening and inflammatory changes in the region of the cecum. This is nonspecific, and could reflect either right-sided diverticulitis, focal area of colitis, or potentially even underlying neoplasm. 3. Cholelithiasis. Gallbladder is nearly completely contracted without surrounding inflammatory changes to suggest an acute cholecystitis at this time. 4. Left-sided nephrolithiasis measuring up to 8 mm in the upper pole collecting system of left kidney. No ureteral stones or findings of urinary tract obstruction. 5. Aortic atherosclerosis.   02/27/2018 Initial Biopsy   Diagnosis 02/27/18  Liver, needle/core biopsy, Right Hepatic Lobe - ADENOCARCINOMA. SEE NOTE.   02/27/2018 Miscellaneous     03/05/2018 Procedure   Colonoscopy 03/05/18  IMPRESSION - Preparation of the colon was fair. - Non-thrombosed external hemorrhoids found on digital rectal exam. - There was significant looping of the colon. - Severe diverticulosis in the recto-sigmoid colon, in the sigmoid colon, in the descending colon, in the transverse colon, at the hepatic flexure, in the ascending  colon and in the cecum. There was no evidence of diverticular bleeding. - A single (solitary) ulcer in the cecum - highly concerning for underlying malignancy. Biopsied. Phlegmonous change is possible, though often in setting of complicated diverticulosis/diverticulitis ulceration would not be as common. - Erythematous mucosa in the transverse colon, at the hepatic flexure, in the ascending colon and in the cecum. Biopsied. - Normal mucosa in the rectum, in the recto-sigmoid colon, in the sigmoid colon and in the descending colon. Biopsied. - Non-bleeding non-thrombosed external and internal hemorrhoids. -----Negative for malignancy    03/11/2018 Initial Diagnosis   Intrahepatic cholangiocarcinoma ([place])   04/02/2018 Imaging   CT CAP W contrast 04/02/18  IMPRESSION: 1. 4 mm subpleural nodule in the periphery of the right lower lobe. This is nonspecific, but strongly favored to represent a benign subpleural lymph node. Attention on follow-up studies is recommended to exclude the possibility of metastatic disease. 2. Diffuse bronchial wall thickening with moderate centrilobular and mild paraseptal emphysema; imaging findings suggestive of underlying COPD. 3. Aortic atherosclerosis, in addition to left main and left anterior descending coronary artery disease. Assessment for potential risk factor modification, dietary therapy or pharmacologic therapy may be warranted, if clinically indicated. 4. Nonobstructive calculi in the upper pole collecting system of left kidney measuring up to 7 mm.    04/27/2018 Cancer Staging   Staging form: Intrahepatic Bile Duct, AJCC 8th Edition - Clinical: Stage IIIB (cT2, cN1, cM0) - Signed by Truitt Merle, MD on 04/27/2018   05/18/2018 - 02/10/2019 Chemotherapy   Gemcitabine and Cisplatin every 2 weeks starting on 05/18/2018, with 50% dose reduction. D/c after 02/10/19 due to disease progression.     07/19/2018 Imaging   CT CAP W CONTRAST   IMPRESSION: 1. Dominant inferior liver mass is decreased in size. Two  subcentimeter clustered low-attenuation lesions in the far inferior right liver lobe, not discretely visualized on the prior noncontrast CT study, can not exclude new small satellite hepatic metastases. 2. Porta hepatis adenopathy is mildly decreased. 3. Small right pulmonary nodules are stable. 4. No additional potential new or progressive metastatic disease. 5. Small pericardial effusion, slightly increased. 6. Cholelithiasis. 7. Moderate diffuse colonic diverticulosis. 8. Aortic Atherosclerosis (ICD10-I70.0) and Emphysema (ICD10-J43.9).    11/02/2018 Imaging   CT CAP WO contrast  IMPRESSION: 1. The dominant right hepatic lobe mass and most of the smaller right hepatic lobe lesions are stable. One of the right hepatic lobe lesions appears to have increased in size, formerly about 1.1 by 1.2 cm and currently 1.9 by 1.5 cm. 2. Two small right lower lobe pulmonary nodules are stable in size. 3. A hypodense right thyroid nodule has enlarged, currently 1.9 by 1.2 cm and formerly 1.1 by 0.7 cm. Consider thyroid ultrasound for further evaluation. 4. Other imaging findings of potential clinical significance: Aortic Atherosclerosis (ICD10-I70.0) and Emphysema (ICD10-J43.9). Coronary atherosclerosis. Mild cardiomegaly. Low-density blood pool suggests anemia. Nonobstructive left nephrolithiasis. Colonic diverticulosis.   02/22/2019 Imaging   CT CAP WO Contrast  IMPRESSION: 1. Interval increase in size of 2 now dominant hepatic lesions consistent with disease progression. Assessment of liver parenchyma limited by noncontrast study. 2. Stable right lower lobe pulmonary nodule. 2nd pulmonary nodule seen previously not identified today. 3. Stable 20 mm right thyroid nodule. 4.  Emphysema. (ICD10-J43.9) 5.  Aortic Atherosclerois (ICD10-170.0)   02/24/2019 - 04/21/2019 Chemotherapy   Second-line FOLFOX  with dose  reduction starting 02/24/19. D/c due to disease progression in Liver. Last dose 04/21/19.    05/02/2019 Imaging   CT CAP WO Contrast IMPRESSION: 1. Continued mild progression of the dominant central and smaller inferior right liver lesions. 2. Haziness and nodularity in the right omentum, adjacent to the inferior right liver is stable. 3. Tiny subpleural nodule right lower lobe is stable. 4. Stable 2 cm right thyroid nodule. Continued attention on follow-up recommended. 5.  Emphysema. (ICD10-J43.9) 6.  Aortic Atherosclerois (ICD10-170.0)   06/07/2019 -  Chemotherapy   Oral Lynparza '100mg'$  BID starting on 06/07/19      CURRENT THERAPY:  OralLynparza'100mg'$  BID starting on 06/07/19  INTERVAL HISTORY:  Mary Chapman is here for a follow up. She presents to the clinic alone. She notes he is doing fairly well. She notes left groin lump which she found 2 weeks ago. This has been stable mostly as she only feels it when standing or sitting. This does not cause her pain. She notes her RUQ pain is 5/10. She takes Oxycodone 2-3 times a day. She is able to do most of her activities. She also notes fatigue. She will take naps as needed and is up more than 50% some days.  She denies any issues from [place] currently.    REVIEW OF SYSTEMS:   Constitutional: Denies fevers, chills or abnormal weight loss (+) fatigue  Eyes: Denies blurriness of vision Ears, nose, mouth, throat, and face: Denies mucositis or sore throat Respiratory: Denies cough, dyspnea or wheezes Cardiovascular: Denies palpitation, chest discomfort or lower extremity swelling Gastrointestinal:  Denies nausea, heartburn or change in bowel habits Skin: Denies abnormal skin rashes (+) Palpable lump at left groin Lymphatics: Denies new lymphadenopathy or easy bruising Neurological:Denies numbness, tingling or new weaknesses Behavioral/Psych: Mood is stable, no new changes  All other systems were reviewed with the patient and are  negative.  MEDICAL HISTORY:  Past Medical  History:  Diagnosis Date  . Diverticulitis   . Focal segmental glomerulosclerosis    ESRD on MWF HD  . Hypertension   . intrahepatic cholangio ca dx'd10/2019  . Renal insufficiency    dialysis pt MWF  . Tobacco abuse     SURGICAL HISTORY: Past Surgical History:  Procedure Laterality Date  . AV FISTULA PLACEMENT Left 03/18/2017   Procedure: Brachiocephalic ARTERIOVENOUS (AV) FISTULA CREATION left arm;  Surgeon: Elam Dutch, MD;  Location: [place];  Service: Vascular;  Laterality: Left;  . BIOPSY  03/05/2018   Procedure: BIOPSY;  Surgeon: Rush Landmark Telford Nab., MD;  Location: [place];  Service: Gastroenterology;;  enteroscopy bx /cytology brushing Greig Castilla bx  . COLONOSCOPY WITH PROPOFOL N/A 03/05/2018   Procedure: COLONOSCOPY WITH PROPOFOL;  Surgeon: Rush Landmark Telford Nab., MD;  Location: [place];  Service: Gastroenterology;  Laterality: N/A;  Bx, Spot  . ENTEROSCOPY N/A 03/05/2018   Procedure: ENTEROSCOPY;  Surgeon: Rush Landmark Telford Nab., MD;  Location: [place];  Service: Gastroenterology;  Laterality: N/A;  BX, Brushing, & Spot  . INSERTION OF DIALYSIS CATHETER N/A 03/18/2017   Procedure: INSERTION OF DIALYSIS CATHETER;  Surgeon: Elam Dutch, MD;  Location: [facility];  Service: Vascular;  Laterality: N/A;  . IR GENERIC HISTORICAL  08/15/2016   IR US GUIDE VASC ACCESS RIGHT 08/15/2016 Arne Cleveland, MD [facility]  . IR GENERIC HISTORICAL  08/15/2016   IR FLUORO GUIDE CV LINE RIGHT 08/15/2016 Arne Cleveland, MD [facility]  . IR IMAGING GUIDED PORT INSERTION  05/06/2018  . IR RADIOLOGIST EVAL & MGMT  06/09/2019  . SUBMUCOSAL INJECTION  03/05/2018   Procedure: SUBMUCOSAL INJECTION;  Surgeon: Rush Landmark Telford Nab., MD;  Location: [place];  Service: Gastroenterology;;  spot tatoo  . TUBAL LIGATION      I have reviewed the social history and family history with the patient and they are unchanged from previous  note.  ALLERGIES:  has No Known Allergies.  MEDICATIONS:  Current Outpatient Medications  Medication Sig Dispense Refill  . amLODipine (NORVASC) 10 MG tablet Take 10 mg by mouth at bedtime.     Lorin Picket 1 GM 210 MG(Fe) tablet Take 3 tablet by mouth three times a day with meals 1 tab with snack    . folic acid (FOLVITE) 1 MG tablet Take 1 tablet (1 mg total) by mouth daily. 30 tablet 0  . gabapentin (NEURONTIN) 100 MG capsule Take 1 capsule (100 mg total) by mouth at bedtime as needed (pain).    . megestrol (MEGACE) 40 MG/ML suspension     . Methoxy PEG-Epoetin Beta (MIRCERA IJ) Mircera    . metoprolol tartrate (LOPRESSOR) 25 MG tablet Take 0.5 tablets (12.5 mg total) by mouth 2 (two) times daily. 30 tablet 0  . multivitamin (RENA-VIT) TABS tablet Take 1 tablet by mouth 3 (three) times a week.    . Nutritional Supplements (FEEDING SUPPLEMENT, NEPRO CARB STEADY,) LIQD Take 237 mLs by mouth 3 (three) times daily as needed (Supplement). 90 Can 0  . olaparib (LYNPARZA) 100 MG tablet Take 1 tablet (100 mg total) by mouth 2 (two) times daily. Swallow whole. May take with food to decrease nausea and vomiting. 60 tablet 0  . oxycodone (OXY-IR) 5 MG capsule Take 1-2 capsules (5-10 mg total) by mouth every 8 (eight) hours as needed. 90 capsule 0  . pantoprazole (PROTONIX) 40 MG tablet Take 1 tablet (40 mg total) by mouth 2 (two) times daily. 60 tablet 2  . polyethylene glycol (MIRALAX)  packet Take 17 g by mouth daily. (Patient taking differently: Take 17 g by mouth daily as needed for mild constipation. ) 30 each 0  . senna-docusate (SENOKOT-S) 8.6-50 MG tablet Take 2 tablets by mouth 2 (two) times daily. (Patient taking differently: Take 2 tablets by mouth at bedtime as needed for mild constipation. ) 30 tablet 0   No current facility-administered medications for this visit.    PHYSICAL EXAMINATION: ECOG PERFORMANCE STATUS: 2 - Symptomatic, <50% confined to bed  Vitals:   07/13/19 1314  BP: (!)  174/83  Pulse: 88  Resp: 17  Temp: 98.9 F (37.2 C)  SpO2: 93%   Filed Weights   07/13/19 1314  Weight: 131 lb 6.4 oz (59.6 kg)    GENERAL:alert, no distress and comfortable SKIN: skin color, texture, turgor are normal, no rashes or significant lesions EYES: normal, Conjunctiva are pink and non-injected, sclera clear  NECK: supple, thyroid normal size, non-tender, without nodularity LYMPH:  no palpable lymphadenopathy in the cervical, axillary or inguinal (+) palpable nodule above left groin, mainly when standing.  LUNGS: clear to auscultation and percussion with normal breathing effort HEART: regular rate & rhythm and no murmurs and no lower extremity edema ABDOMEN:abdomen soft, non-tender and normal bowel sounds (+) palpable hepatomegaly in umbilical region.  Musculoskeletal:no cyanosis of digits and no clubbing  NEURO: alert & oriented x 3 with fluent speech, no focal motor/sensory deficits  LABORATORY DATA:  I have reviewed the data as listed CBC Latest Ref Rng & Units 07/13/2019 06/20/2019 05/27/2019  WBC 4.0 - 10.5 K/uL 7.5 6.1 6.3  Hemoglobin 12.0 - 15.0 g/dL 10.0(L) 12.5 11.7(L)  Hematocrit 36.0 - 46.0 % 34.1(L) 42.3 38.5  Platelets 150 - 400 K/uL 241 257 286     CMP Latest Ref Rng & Units 07/13/2019 06/20/2019 05/27/2019  Glucose 70 - 99 mg/dL 99 95 119(H)  BUN 8 - 23 mg/dL '10 22 11  '$ Creatinine 0.44 - 1.00 mg/dL 3.05(HH) 4.71(HH) 3.57(HH)  Sodium 135 - 145 mmol/L 137 139 142  Potassium 3.5 - 5.1 mmol/L 3.3(L) 3.7 3.3(L)  Chloride 98 - 111 mmol/L 96(L) 98 98  CO2 22 - 32 mmol/L 30 29 33(H)  Calcium 8.9 - 10.3 mg/dL 8.1(L) 8.4(L) 8.1(L)  Total Protein 6.5 - 8.1 g/dL 7.3 7.6 7.4  Total Bilirubin 0.3 - 1.2 mg/dL 0.6 0.4 0.3  Alkaline Phos 38 - 126 U/L 126 117 136(H)  AST 15 - 41 U/L 13(L) 12(L) 13(L)  ALT 0 - 44 U/L <6 6 <6      RADIOGRAPHIC STUDIES: I have personally reviewed the radiological images as listed and agreed with the findings in the report. No results  found.   ASSESSMENT & PLAN:  Mary Chapman is a 74 y.o. female with    1. Adenocarcinomainliver, likelyintrahepatic cholangiocarcinoma, cT2N1M0 stage IIIB, unresectable, MS-Stable -Diagnosed in 02/2018. She is not eligible for surgery due to tumor size and location.She was seen by Dr. Cloyd Stagers at [facility].  -She was initiallytreated with first-lineCisplatin/Gemcitabineq2weekssince 12/2019but unfortunately had disease progression as seen on 02/2019 CT CAP.  -She was treated with second-line FOLFOX with dose reduction every 2 weeks on 02/24/19, unfortunately she recently had disease progressed based on 05/02/19 CT CAP. -Her FO results show BRCA1 mutation of her tumor which makes her eligible for PARP2 inhibitorLynparza ( olaparib ).I started her on Lynparza '100mg'$  BID on 06/07/19. Dose reduced due to her ESRD. She has been tolerating well with no major issues.  -She has consulted with Dr. Kathlene Cote  and he offered target liver therapy if she does not respond to current regimen. She is interested.  -I plan to scan her in March. Will scan her sooner if pain significantly worsen or tumor marker rises -She is clinically stable with fatigue and a recent left groin lump. Physical exam shows significant hepatomegaly and left groin nodule which I suspect is hernia. Labs reviewed and adequate to continue Lynparza '100mg'$  BID. She is tolerating well with no major issues.  -F/u in 4 weeks with next scan.    2. Right abdominal pain  -Secondary to #1 -Her 05/02/19 CT scan shows disease progression in liver. Her painisintermitted but continues to increase and continues to havehepatomegalyon exam. -She knows to take Miralax as needed to combat constipation from pain meds.  -She has hepatomegaly on exam today. Her pain remains stable at 5/10. She currently takes oxycodone '10mg'$  2-3 times a day. I encouraged her to watch for jaundice of eyes and dark urine.   3. HTN  -Currently on metoprololand  amlodipine  -Her BP remains consistently high. Ihave spokenwithher nephrologistaboutadjustingher medications to better control her HTN.  3. End Stage Renal Disease, on HD MWF -Treated with dialysis, tolerating well. Continue to f/u with nephrologist Dr. Augustin Coupe -Given chemo pump D/c is on every other Saturday Ipreviouslyrecommendedshe change her Dialysis timeto later on Saturdays, she is agreeable. -I discussed this can contribute to her fatigue.   4. Anemia of chronic diseaseand chemo induced anemia, secondary to CKD and Malignancy -She has receivedblood transfusion intermittently  -She restartedher onEPO at her dialysis center due to her worsening anemia  -Given she is no longer on chemo, I discussed her EPO injections will only be given if Hg less than 11.   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 -Controlled on Gabapentin'100mg'$  at night 2-3 times a week.  6.Goal of care discussion, DNR/DNI -Wepreviouslydiscussed the incurable nature of her cancer, and the overall poor prognosis, especially if she does not have good response tolimitedchemotherapyoptionsor progress on chemo -The patient understands the goal of care is palliative. -I recommend DNR/DNI, sheagreed with DNR/DNI. She plans to make her daughter Wonda Amis.   Plan -I refilled Oxycodone and Megace today  -Labs reviewed and adequate to continue Lynparza '100mg'$  BID, refilled today.  -f/u in 4 weeks with lab and CT scan a few days before     No problem-specific Assessment & Plan notes found for this encounter.   Orders Placed This Encounter  Procedures  . CT Abdomen Pelvis Wo Contrast    Standing Status:   Future    Standing Expiration Date:   07/12/2020    Order Specific Question:   Preferred imaging location?    Answer:   [facility]    Order Specific Question:   Is Oral Contrast requested for this exam?    Answer:   Yes,  Per Radiology protocol    Order Specific Question:   Radiology Contrast Protocol - do NOT remove file path    Answer:   [email or web address]  . CA 19.9    Standing Status:   Standing    Number of Occurrences:   20    Standing Expiration Date:   07/10/2024   All questions were answered. The patient knows to call the clinic with any problems, questions or concerns. No barriers to learning was detected. The total time spent in the appointment was 30 minutes.     Truitt Merle, MD 07/13/2019   Normajean Baxter  Richardson Landry, am acting as scribe for Truitt Merle, MD.   I have reviewed the above documentation for accuracy and completeness, and I agree with the above.

## 2019-07-13 ENCOUNTER — Inpatient Hospital Stay: Payer: Medicare Other

## 2019-07-13 ENCOUNTER — Telehealth: Payer: Self-pay

## 2019-07-13 ENCOUNTER — Encounter: Payer: Self-pay | Admitting: Hematology

## 2019-07-13 ENCOUNTER — Inpatient Hospital Stay (HOSPITAL_BASED_OUTPATIENT_CLINIC_OR_DEPARTMENT_OTHER): Payer: Medicare Other | Admitting: Hematology

## 2019-07-13 ENCOUNTER — Other Ambulatory Visit: Payer: Self-pay

## 2019-07-13 VITALS — BP 174/83 | HR 88 | Temp 98.9°F | Resp 17 | Ht 66.0 in | Wt 131.4 lb

## 2019-07-13 DIAGNOSIS — N186 End stage renal disease: Secondary | ICD-10-CM | POA: Diagnosis not present

## 2019-07-13 DIAGNOSIS — C221 Intrahepatic bile duct carcinoma: Secondary | ICD-10-CM

## 2019-07-13 DIAGNOSIS — I10 Essential (primary) hypertension: Secondary | ICD-10-CM

## 2019-07-13 DIAGNOSIS — Z992 Dependence on renal dialysis: Secondary | ICD-10-CM

## 2019-07-13 LAB — CBC WITH DIFFERENTIAL (CANCER CENTER ONLY)
Abs Immature Granulocytes: 0.03 10*3/uL (ref 0.00–0.07)
Basophils Absolute: 0 10*3/uL (ref 0.0–0.1)
Basophils Relative: 0 %
Eosinophils Absolute: 0.1 10*3/uL (ref 0.0–0.5)
Eosinophils Relative: 1 %
HCT: 34.1 % — ABNORMAL LOW (ref 36.0–46.0)
Hemoglobin: 10 g/dL — ABNORMAL LOW (ref 12.0–15.0)
Immature Granulocytes: 0 %
Lymphocytes Relative: 9 %
Lymphs Abs: 0.7 10*3/uL (ref 0.7–4.0)
MCH: 25.3 pg — ABNORMAL LOW (ref 26.0–34.0)
MCHC: 29.3 g/dL — ABNORMAL LOW (ref 30.0–36.0)
MCV: 86.3 fL (ref 80.0–100.0)
Monocytes Absolute: 0.9 10*3/uL (ref 0.1–1.0)
Monocytes Relative: 12 %
Neutro Abs: 5.8 10*3/uL (ref 1.7–7.7)
Neutrophils Relative %: 78 %
Platelet Count: 241 10*3/uL (ref 150–400)
RBC: 3.95 MIL/uL (ref 3.87–5.11)
RDW: 20.6 % — ABNORMAL HIGH (ref 11.5–15.5)
WBC Count: 7.5 10*3/uL (ref 4.0–10.5)
nRBC: 0 % (ref 0.0–0.2)

## 2019-07-13 LAB — CMP (CANCER CENTER ONLY)
ALT: <6 U/L (ref 0–44)
AST: 13 U/L — ABNORMAL LOW (ref 15–41)
Albumin: 2.7 g/dL — ABNORMAL LOW (ref 3.5–5.0)
Alkaline Phosphatase: 126 U/L (ref 38–126)
Anion gap: 11 (ref 5–15)
BUN: 10 mg/dL (ref 8–23)
CO2: 30 mmol/L (ref 22–32)
Calcium: 8.1 mg/dL — ABNORMAL LOW (ref 8.9–10.3)
Chloride: 96 mmol/L — ABNORMAL LOW (ref 98–111)
Creatinine: 3.05 mg/dL (ref 0.44–1.00)
GFR, Est AFR Am: 17 mL/min — ABNORMAL LOW (ref >60)
GFR, Estimated: 15 mL/min — ABNORMAL LOW (ref >60)
Glucose, Bld: 99 mg/dL (ref 70–99)
Potassium: 3.3 mmol/L — ABNORMAL LOW (ref 3.5–5.1)
Sodium: 137 mmol/L (ref 135–145)
Total Bilirubin: 0.6 mg/dL (ref 0.3–1.2)
Total Protein: 7.3 g/dL (ref 6.5–8.1)

## 2019-07-13 MED ORDER — OXYCODONE HCL 5 MG PO CAPS: 5.0000 mg | ORAL_CAPSULE | Freq: Three times a day (TID) | ORAL | 0 refills | Status: DC | PRN

## 2019-07-13 MED ORDER — OLAPARIB 100 MG PO TABS: 100.0000 mg | ORAL_TABLET | Freq: Two times a day (BID) | ORAL | 0 refills | Status: DC

## 2019-07-13 NOTE — Progress Notes (Signed)
Critical lab value called on patient for creatinine 3.05.  Hand delivered result to Danae Orleans, LPN supporting Dr. Burr Medico.   Gardiner Rhyme

## 2019-07-13 NOTE — Telephone Encounter (Signed)
CRITICAL VALUE STICKER  CRITICAL VALUE: creatinine 3.05   RECEIVER (on-site recipient of call):Mary Chapman NOTIFIED: 07/13/2111:50   MESSENGER (representative from lab):Larina Earthly RN  MD NOTIFIED: Dr. Burr Medico  TIME OF NOTIFICATION: 13:51   RESPONSE: MD aware of lab result

## 2019-07-14 ENCOUNTER — Telehealth: Payer: Self-pay | Admitting: Hematology

## 2019-07-14 LAB — CANCER ANTIGEN 19-9: CA 19-9: 45 U/mL — ABNORMAL HIGH (ref 0–35)

## 2019-07-14 NOTE — Telephone Encounter (Signed)
Scheduled appt per 2/24 los.  Sent a message to HIM pool to calendar mailed out.

## 2019-07-26 ENCOUNTER — Other Ambulatory Visit: Payer: Self-pay

## 2019-07-26 ENCOUNTER — Telehealth: Payer: Self-pay

## 2019-07-26 DIAGNOSIS — C221 Intrahepatic bile duct carcinoma: Secondary | ICD-10-CM

## 2019-07-26 MED ORDER — OLAPARIB 100 MG PO TABS: 100.0000 mg | ORAL_TABLET | Freq: Two times a day (BID) | ORAL | 0 refills | Status: DC

## 2019-07-26 NOTE — Telephone Encounter (Signed)
Ms Seward daughter, Cecille Rubin called stating Ms Worster had not received her [place]  Further review showed this rx was sent to a local pharmacy.  Ms Wachtel gets her Lonie Peak from [facility] via patient assistance program. A new rx was sent to [facility].

## 2019-08-02 ENCOUNTER — Telehealth: Payer: Self-pay | Admitting: Hematology

## 2019-08-02 ENCOUNTER — Ambulatory Visit (HOSPITAL_COMMUNITY)
Admission: RE | Admit: 2019-08-02 | Discharge: 2019-08-02 | Disposition: A | Discharge status: Home / Self Care | Payer: Medicare Other | Source: Ambulatory Visit | Attending: Hematology | Admitting: Hematology

## 2019-08-02 ENCOUNTER — Other Ambulatory Visit: Payer: Self-pay

## 2019-08-02 ENCOUNTER — Encounter (HOSPITAL_COMMUNITY): Payer: Self-pay

## 2019-08-02 DIAGNOSIS — C221 Intrahepatic bile duct carcinoma: Secondary | ICD-10-CM | POA: Diagnosis present

## 2019-08-02 NOTE — Telephone Encounter (Signed)
Called pt per 3/16 sch message - no answer - unable to leave message

## 2019-08-05 ENCOUNTER — Inpatient Hospital Stay: Payer: Medicare Other

## 2019-08-06 ENCOUNTER — Other Ambulatory Visit: Payer: Self-pay | Admitting: Hematology

## 2019-08-06 DIAGNOSIS — C221 Intrahepatic bile duct carcinoma: Secondary | ICD-10-CM

## 2019-08-08 NOTE — Telephone Encounter (Signed)
Refill request

## 2019-08-09 ENCOUNTER — Other Ambulatory Visit: Payer: Self-pay

## 2019-08-09 ENCOUNTER — Telehealth: Payer: Self-pay

## 2019-08-09 DIAGNOSIS — C221 Intrahepatic bile duct carcinoma: Secondary | ICD-10-CM

## 2019-08-09 NOTE — Telephone Encounter (Signed)
Ms Scharf daughter called stating that her mother is having increased fatigue.  No shortness of breath or chest pain.  She wanted to know if she should take her mother to the ED or wait until appt this Thursday.  Ms Tout is not in acute distress I suggested she waits until Thursday.  CBC and sample to blood bank ordered.

## 2019-08-10 ENCOUNTER — Emergency Department (HOSPITAL_BASED_OUTPATIENT_CLINIC_OR_DEPARTMENT_OTHER): Payer: Medicare Other

## 2019-08-10 ENCOUNTER — Encounter (HOSPITAL_BASED_OUTPATIENT_CLINIC_OR_DEPARTMENT_OTHER): Payer: Self-pay | Admitting: Emergency Medicine

## 2019-08-10 ENCOUNTER — Observation Stay (HOSPITAL_BASED_OUTPATIENT_CLINIC_OR_DEPARTMENT_OTHER)
Admission: EM | Admit: 2019-08-10 | Discharge: 2019-08-13 | Disposition: A | Discharge status: Home / Self Care | Payer: Medicare Other | Attending: Internal Medicine | Admitting: Internal Medicine

## 2019-08-10 ENCOUNTER — Other Ambulatory Visit: Payer: Self-pay

## 2019-08-10 DIAGNOSIS — K219 Gastro-esophageal reflux disease without esophagitis: Secondary | ICD-10-CM | POA: Diagnosis present

## 2019-08-10 DIAGNOSIS — N186 End stage renal disease: Secondary | ICD-10-CM

## 2019-08-10 DIAGNOSIS — Z95828 Presence of other vascular implants and grafts: Secondary | ICD-10-CM | POA: Insufficient documentation

## 2019-08-10 DIAGNOSIS — R7989 Other specified abnormal findings of blood chemistry: Secondary | ICD-10-CM | POA: Diagnosis not present

## 2019-08-10 DIAGNOSIS — K449 Diaphragmatic hernia without obstruction or gangrene: Secondary | ICD-10-CM | POA: Insufficient documentation

## 2019-08-10 DIAGNOSIS — Z66 Do not resuscitate: Secondary | ICD-10-CM | POA: Insufficient documentation

## 2019-08-10 DIAGNOSIS — I1311 Hypertensive heart and chronic kidney disease without heart failure, with stage 5 chronic kidney disease, or end stage renal disease: Secondary | ICD-10-CM | POA: Insufficient documentation

## 2019-08-10 DIAGNOSIS — Z992 Dependence on renal dialysis: Secondary | ICD-10-CM | POA: Insufficient documentation

## 2019-08-10 DIAGNOSIS — E876 Hypokalemia: Secondary | ICD-10-CM | POA: Diagnosis not present

## 2019-08-10 DIAGNOSIS — Z79899 Other long term (current) drug therapy: Secondary | ICD-10-CM | POA: Diagnosis not present

## 2019-08-10 DIAGNOSIS — I420 Dilated cardiomyopathy: Secondary | ICD-10-CM | POA: Insufficient documentation

## 2019-08-10 DIAGNOSIS — J440 Chronic obstructive pulmonary disease with acute lower respiratory infection: Secondary | ICD-10-CM | POA: Insufficient documentation

## 2019-08-10 DIAGNOSIS — Z20822 Contact with and (suspected) exposure to covid-19: Secondary | ICD-10-CM | POA: Insufficient documentation

## 2019-08-10 DIAGNOSIS — D631 Anemia in chronic kidney disease: Secondary | ICD-10-CM | POA: Diagnosis not present

## 2019-08-10 DIAGNOSIS — F17200 Nicotine dependence, unspecified, uncomplicated: Secondary | ICD-10-CM | POA: Diagnosis not present

## 2019-08-10 DIAGNOSIS — I10 Essential (primary) hypertension: Secondary | ICD-10-CM | POA: Diagnosis not present

## 2019-08-10 DIAGNOSIS — C221 Intrahepatic bile duct carcinoma: Secondary | ICD-10-CM | POA: Diagnosis not present

## 2019-08-10 DIAGNOSIS — R778 Other specified abnormalities of plasma proteins: Secondary | ICD-10-CM | POA: Diagnosis not present

## 2019-08-10 DIAGNOSIS — R2689 Other abnormalities of gait and mobility: Secondary | ICD-10-CM | POA: Insufficient documentation

## 2019-08-10 DIAGNOSIS — N2581 Secondary hyperparathyroidism of renal origin: Secondary | ICD-10-CM | POA: Insufficient documentation

## 2019-08-10 DIAGNOSIS — R509 Fever, unspecified: Secondary | ICD-10-CM

## 2019-08-10 DIAGNOSIS — E041 Nontoxic single thyroid nodule: Secondary | ICD-10-CM | POA: Insufficient documentation

## 2019-08-10 DIAGNOSIS — I2699 Other pulmonary embolism without acute cor pulmonale: Secondary | ICD-10-CM | POA: Diagnosis present

## 2019-08-10 DIAGNOSIS — Z8249 Family history of ischemic heart disease and other diseases of the circulatory system: Secondary | ICD-10-CM | POA: Diagnosis not present

## 2019-08-10 DIAGNOSIS — J9600 Acute respiratory failure, unspecified whether with hypoxia or hypercapnia: Secondary | ICD-10-CM

## 2019-08-10 LAB — COMPREHENSIVE METABOLIC PANEL
ALT: 8 U/L (ref 0–44)
AST: 17 U/L (ref 15–41)
Albumin: 2.3 g/dL — ABNORMAL LOW (ref 3.5–5.0)
Alkaline Phosphatase: 122 U/L (ref 38–126)
Anion gap: 12 (ref 5–15)
BUN: 20 mg/dL (ref 8–23)
CO2: 28 mmol/L (ref 22–32)
Calcium: 7.7 mg/dL — ABNORMAL LOW (ref 8.9–10.3)
Chloride: 93 mmol/L — ABNORMAL LOW (ref 98–111)
Creatinine, Ser: 5.92 mg/dL — ABNORMAL HIGH (ref 0.44–1.00)
GFR calc Af Amer: 8 mL/min — ABNORMAL LOW (ref >60)
GFR calc non Af Amer: 7 mL/min — ABNORMAL LOW (ref >60)
Glucose, Bld: 105 mg/dL — ABNORMAL HIGH (ref 70–99)
Potassium: 3 mmol/L — ABNORMAL LOW (ref 3.5–5.1)
Sodium: 133 mmol/L — ABNORMAL LOW (ref 135–145)
Total Bilirubin: 0.7 mg/dL (ref 0.3–1.2)
Total Protein: 6.7 g/dL (ref 6.5–8.1)

## 2019-08-10 LAB — CBC WITH DIFFERENTIAL/PLATELET
Abs Immature Granulocytes: 0.06 10*3/uL (ref 0.00–0.07)
Basophils Absolute: 0 10*3/uL (ref 0.0–0.1)
Basophils Relative: 0 %
Eosinophils Absolute: 0 10*3/uL (ref 0.0–0.5)
Eosinophils Relative: 0 %
HCT: 26.6 % — ABNORMAL LOW (ref 36.0–46.0)
Hemoglobin: 7.8 g/dL — ABNORMAL LOW (ref 12.0–15.0)
Immature Granulocytes: 1 %
Lymphocytes Relative: 6 %
Lymphs Abs: 0.5 10*3/uL — ABNORMAL LOW (ref 0.7–4.0)
MCH: 24.5 pg — ABNORMAL LOW (ref 26.0–34.0)
MCHC: 29.3 g/dL — ABNORMAL LOW (ref 30.0–36.0)
MCV: 83.6 fL (ref 80.0–100.0)
Monocytes Absolute: 1.1 10*3/uL — ABNORMAL HIGH (ref 0.1–1.0)
Monocytes Relative: 13 %
Neutro Abs: 6.6 10*3/uL (ref 1.7–7.7)
Neutrophils Relative %: 80 %
Platelets: 331 10*3/uL (ref 150–400)
RBC: 3.18 MIL/uL — ABNORMAL LOW (ref 3.87–5.11)
RDW: 20.5 % — ABNORMAL HIGH (ref 11.5–15.5)
WBC: 8.3 10*3/uL (ref 4.0–10.5)
nRBC: 0 % (ref 0.0–0.2)

## 2019-08-10 LAB — TROPONIN I (HIGH SENSITIVITY)
Troponin I (High Sensitivity): 36 ng/L — ABNORMAL HIGH (ref <18)
Troponin I (High Sensitivity): 36 ng/L — ABNORMAL HIGH (ref <18)

## 2019-08-10 LAB — D-DIMER, QUANTITATIVE: D-Dimer, Quant: >20 ug/mL-FEU — ABNORMAL HIGH (ref 0.00–0.50)

## 2019-08-10 LAB — OCCULT BLOOD X 1 CARD TO LAB, STOOL: Fecal Occult Bld: NEGATIVE

## 2019-08-10 LAB — RESPIRATORY PANEL BY RT PCR (FLU A&B, COVID)
Influenza A by PCR: NEGATIVE
Influenza B by PCR: NEGATIVE
SARS Coronavirus 2 by RT PCR: NEGATIVE

## 2019-08-10 LAB — HEPARIN LEVEL (UNFRACTIONATED): Heparin Unfractionated: 0.71 IU/mL — ABNORMAL HIGH (ref 0.30–0.70)

## 2019-08-10 LAB — BRAIN NATRIURETIC PEPTIDE: B Natriuretic Peptide: 1461.6 pg/mL — ABNORMAL HIGH (ref 0.0–100.0)

## 2019-08-10 MED ORDER — NEPRO/CARBSTEADY PO LIQD: 237.0000 mL | Freq: Three times a day (TID) | ORAL | Status: DC | PRN

## 2019-08-10 MED ORDER — CHLORHEXIDINE GLUCONATE CLOTH 2 % EX PADS
6.0000 | MEDICATED_PAD | Freq: Every day | CUTANEOUS | Status: DC
  Administered 2019-08-11 – 2019-08-13 (×3): 6 via TOPICAL

## 2019-08-10 MED ORDER — DOXERCALCIFEROL 4 MCG/2ML IV SOLN
6.0000 ug | INTRAVENOUS | Status: DC
  Filled 2019-08-10: qty 4

## 2019-08-10 MED ORDER — POLYETHYLENE GLYCOL 3350 17 G PO PACK: 17.0000 g | PACK | Freq: Every day | ORAL | Status: DC | PRN

## 2019-08-10 MED ORDER — SENNOSIDES-DOCUSATE SODIUM 8.6-50 MG PO TABS: 2.0000 | ORAL_TABLET | Freq: Every evening | ORAL | Status: DC | PRN

## 2019-08-10 MED ORDER — ONDANSETRON HCL 4 MG PO TABS
4.0000 mg | ORAL_TABLET | Freq: Four times a day (QID) | ORAL | Status: DC | PRN
  Administered 2019-08-11: 4 mg via ORAL
  Filled 2019-08-10: qty 1

## 2019-08-10 MED ORDER — AMLODIPINE BESYLATE 10 MG PO TABS
10.0000 mg | ORAL_TABLET | Freq: Every day | ORAL | Status: DC
  Administered 2019-08-10 – 2019-08-12 (×3): 10 mg via ORAL
  Filled 2019-08-10 (×3): qty 1

## 2019-08-10 MED ORDER — OXYCODONE HCL 5 MG PO TABS
5.0000 mg | ORAL_TABLET | Freq: Three times a day (TID) | ORAL | Status: DC | PRN
  Administered 2019-08-10: 5 mg via ORAL
  Administered 2019-08-11: 10 mg via ORAL
  Administered 2019-08-11: 5 mg via ORAL
  Administered 2019-08-12 (×2): 10 mg via ORAL
  Filled 2019-08-10: qty 1
  Filled 2019-08-10 (×2): qty 2
  Filled 2019-08-10: qty 1
  Filled 2019-08-10: qty 2

## 2019-08-10 MED ORDER — PANTOPRAZOLE SODIUM 40 MG PO TBEC
40.0000 mg | DELAYED_RELEASE_TABLET | Freq: Two times a day (BID) | ORAL | Status: DC
  Administered 2019-08-10 – 2019-08-13 (×6): 40 mg via ORAL
  Filled 2019-08-10 (×7): qty 1

## 2019-08-10 MED ORDER — ACETAMINOPHEN 650 MG RE SUPP: 650.0000 mg | Freq: Four times a day (QID) | RECTAL | Status: DC | PRN

## 2019-08-10 MED ORDER — RENA-VITE PO TABS
1.0000 | ORAL_TABLET | ORAL | Status: DC
  Administered 2019-08-10 – 2019-08-12 (×2): 1 via ORAL
  Filled 2019-08-10 (×2): qty 1

## 2019-08-10 MED ORDER — ALBUTEROL SULFATE (2.5 MG/3ML) 0.083% IN NEBU: 2.5000 mg | INHALATION_SOLUTION | RESPIRATORY_TRACT | Status: DC | PRN

## 2019-08-10 MED ORDER — GABAPENTIN 100 MG PO CAPS: 100.0000 mg | ORAL_CAPSULE | Freq: Every evening | ORAL | Status: DC | PRN

## 2019-08-10 MED ORDER — POTASSIUM CHLORIDE CRYS ER 20 MEQ PO TBCR
30.0000 meq | EXTENDED_RELEASE_TABLET | Freq: Once | ORAL | Status: AC
  Administered 2019-08-10: 30 meq via ORAL
  Filled 2019-08-10: qty 1

## 2019-08-10 MED ORDER — HYDROCODONE-ACETAMINOPHEN 5-325 MG PO TABS: 1.0000 | ORAL_TABLET | Freq: Four times a day (QID) | ORAL | Status: DC | PRN

## 2019-08-10 MED ORDER — IOHEXOL 350 MG/ML SOLN
100.0000 mL | Freq: Once | INTRAVENOUS | Status: AC | PRN
  Administered 2019-08-10: 100 mL via INTRAVENOUS

## 2019-08-10 MED ORDER — ACETAMINOPHEN 325 MG PO TABS
650.0000 mg | ORAL_TABLET | Freq: Four times a day (QID) | ORAL | Status: DC | PRN
  Administered 2019-08-10 – 2019-08-12 (×2): 650 mg via ORAL
  Filled 2019-08-10 (×3): qty 2

## 2019-08-10 MED ORDER — ONDANSETRON HCL 4 MG/2ML IJ SOLN: 4.0000 mg | Freq: Four times a day (QID) | INTRAMUSCULAR | Status: DC | PRN

## 2019-08-10 MED ORDER — SODIUM CHLORIDE 0.9 % IV SOLN
Freq: Once | INTRAVENOUS | Status: AC
  Administered 2019-08-10: 1000 mL via INTRAVENOUS

## 2019-08-10 MED ORDER — METOPROLOL TARTRATE 50 MG PO TABS
50.0000 mg | ORAL_TABLET | Freq: Two times a day (BID) | ORAL | Status: DC
  Administered 2019-08-10 – 2019-08-13 (×6): 50 mg via ORAL
  Filled 2019-08-10 (×6): qty 1

## 2019-08-10 MED ORDER — HEPARIN BOLUS VIA INFUSION
4000.0000 [IU] | Freq: Once | INTRAVENOUS | Status: AC
  Administered 2019-08-10: 4000 [IU] via INTRAVENOUS

## 2019-08-10 MED ORDER — HEPARIN (PORCINE) 25000 UT/250ML-% IV SOLN
1000.0000 [IU]/h | INTRAVENOUS | Status: DC
  Administered 2019-08-10: 1000 [IU]/h via INTRAVENOUS
  Administered 2019-08-11: 950 [IU]/h via INTRAVENOUS
  Filled 2019-08-10 (×2): qty 250

## 2019-08-10 NOTE — Progress Notes (Signed)
ANTICOAGULATION CONSULT NOTE - Follow Up Consult  Pharmacy Consult for heparin Indication: pulmonary embolus  No Known Allergies  Patient Measurements: Height: 5\' 6"  (167.6 cm) Weight: 134 lb 1.6 oz (60.8 kg) IBW/kg (Calculated) : 59.3 Heparin Dosing Weight: 60 kg   Vital Signs: Temp: 101 F (38.3 C) (03/24 2028) Temp Source: Oral (03/24 2028) BP: 136/84 (03/24 2028) Pulse Rate: 96 (03/24 2028)  Labs: Recent Labs    08/10/19 0736 08/10/19 0859 08/10/19 2000  HGB 7.8*  --   --   HCT 26.6*  --   --   PLT 331  --   --   HEPARINUNFRC  --   --  0.71*  CREATININE 5.92*  --   --   TROPONINIHS 36* 36*  --     Estimated Creatinine Clearance: 7.9 mL/min (A) (by C-G formula based on SCr of 5.92 mg/dL (H)).   Medical History: Past Medical History:  Diagnosis Date  . Diverticulitis   . Focal segmental glomerulosclerosis    ESRD on MWF HD  . Hypertension   . intrahepatic cholangio ca dx'd10/2019  . Renal insufficiency    dialysis pt MWF  . Tobacco abuse     Assessment: 74 yr old female with acute R lobe PE without right heart strain. H/H 7.8/26.6 (trending down), platelets 331. Medical history includes HTN, intrahepatic cholangiocarcinoma, and ESRD on HD.  Heparin level ~8.5 hrs after heparin 4000 units IV bolus X 1, followed by initiation of heparin infusion at 1000 units/hr, was 0.71 units/ml, which is just above the goal range for this pt. Per RN, no issues with IV or bleeding observed.  Goal of Therapy:  Heparin level 0.3-0.7 units/ml Monitor platelets by anticoagulation protocol: Yes   Plan:  Decrease heparin infusion to 950 units/hr Check 8-hr heparin level Monitor daily heparin level, CBC Monitor for signs/symptoms of bleeding  Gillermina Hu, PharmD, BCPS, [facility] Clinical Pharmacist 08/10/19, 21;50 PM

## 2019-08-10 NOTE — ED Notes (Signed)
Pt transported to CT ?

## 2019-08-10 NOTE — ED Provider Notes (Signed)
[facility] EMERGENCY DEPARTMENT Provider Note   CSN: [id number] Arrival date & time: 08/10/19  3235     History Chief Complaint  Patient presents with  . Dizziness    Mary Chapman is a 74 y.o. female.  Presents to ER with chief complaint presents to ER with chief complaint shortness of breath, lightheadedness, weakness.  Symptoms ongoing for the last 3 days or so.  Initially feeling generally weak, over the past couple days has been feeling dizzy, lightheaded.  No room spinning sensation.  No syncope.  No chest pain.  Has had some increase shortness of breath over the past couple days as well.  Very mild nonproductive cough, no blood in sputum.  No fevers at home.  Got full dialysis session on Monday.  Felt too weak to go for dialysis today.  No blood in stools, no dark stools.  Chart review, Intermatic cholangiocarcinoma followed by oncology.  HPI     Past Medical History:  Diagnosis Date  . Diverticulitis   . Focal segmental glomerulosclerosis    ESRD on MWF HD  . Hypertension   . intrahepatic cholangio ca dx'd10/2019  . Renal insufficiency    dialysis pt MWF  . Tobacco abuse     Patient Active Problem List   Diagnosis Date Noted  . Pulmonary embolism ([place]) 08/10/2019  . Pneumonia 04/25/2019  . DNR (do not resuscitate) 04/24/2019  . ESRD (end stage renal disease) ([place]) 04/23/2019  . Community acquired pneumonia of right lower lobe of lung   . Genetic testing 11/30/2018  . Port-A-Cath in place 05/18/2018  . Cough   . Cholangiocarcinoma ([place])   . Sepsis ([place]) 04/09/2018  . Intrahepatic cholangiocarcinoma ([place]) 03/11/2018  . Mass of cecum with bleeding 03/04/2018  . GI bleed 03/01/2018  . Gastrointestinal hemorrhage associated with anorectal source   . Mass of right lobe of liver   . Diverticulitis 02/23/2018  . ESRD on dialysis ([place]) 02/23/2018  . Hypophosphatemia 03/17/2017  . Hyperphosphatemia 03/17/2017  . Elevated troponin 03/16/2017  .  Hyperlipidemia 03/16/2017  . SOB (shortness of breath) 03/16/2017  . Focal segmental glomerulosclerosis 03/15/2017  . Nephrotic syndrome 08/20/2016  . Essential hypertension 08/08/2016  . Dyspnea 08/08/2016  . Tobacco abuse   . Anasarca 08/07/2016    Past Surgical History:  Procedure Laterality Date  . AV FISTULA PLACEMENT Left 03/18/2017   Procedure: Brachiocephalic ARTERIOVENOUS (AV) FISTULA CREATION left arm;  Surgeon: Elam Dutch, MD;  Location: [place];  Service: Vascular;  Laterality: Left;  . BIOPSY  03/05/2018   Procedure: BIOPSY;  Surgeon: Rush Landmark Telford Nab., MD;  Location: [place];  Service: Gastroenterology;;  enteroscopy bx /cytology brushing Greig Castilla bx  . COLONOSCOPY WITH PROPOFOL N/A 03/05/2018   Procedure: COLONOSCOPY WITH PROPOFOL;  Surgeon: Rush Landmark Telford Nab., MD;  Location: [facility];  Service: Gastroenterology;  Laterality: N/A;  Bx, Spot  . ENTEROSCOPY N/A 03/05/2018   Procedure: ENTEROSCOPY;  Surgeon: Rush Landmark Telford Nab., MD;  Location: [place];  Service: Gastroenterology;  Laterality: N/A;  BX, Brushing, & Spot  . INSERTION OF DIALYSIS CATHETER N/A 03/18/2017   Procedure: INSERTION OF DIALYSIS CATHETER;  Surgeon: Elam Dutch, MD;  Location: [facility];  Service: Vascular;  Laterality: N/A;  . IR GENERIC HISTORICAL  08/15/2016   IR US GUIDE VASC ACCESS RIGHT 08/15/2016 Arne Cleveland, MD [facility]  . IR GENERIC HISTORICAL  08/15/2016   IR FLUORO GUIDE CV LINE RIGHT 08/15/2016 Arne Cleveland, MD [facility]  . IR IMAGING GUIDED  PORT INSERTION  05/06/2018  . IR RADIOLOGIST EVAL & MGMT  06/09/2019  . SUBMUCOSAL INJECTION  03/05/2018   Procedure: SUBMUCOSAL INJECTION;  Surgeon: Rush Landmark Telford Nab., MD;  Location: [facility];  Service: Gastroenterology;;  spot tatoo  . TUBAL LIGATION       OB History   No obstetric history on file.     Family History  Problem Relation Age of Onset  . Hypertension Mother   . Diabetes Mother    . Hypertension Father   . Diabetes Father   . Hypertension Sister   . Hypertension Brother     Social History   Tobacco Use  . Smoking status: Current Some Day Smoker    Packs/day: 0.25    Years: 20.00    Pack years: 5.00    Types: Cigarettes  . Smokeless tobacco: Never Used  . Tobacco comment: 3 cigs/day  Substance Use Topics  . Alcohol use: No    Comment: moderate drinker for 10 years, quit in 30 years  . Drug use: No    Home Medications Prior to Admission medications   Medication Sig Start Date End Date Taking? Authorizing Provider  amLODipine (NORVASC) 10 MG tablet Take 10 mg by mouth at bedtime.  09/15/18  Yes [provider]  AURYXIA 1 GM 210 MG(Fe) tablet Take 3 tablet by mouth three times a day with meals 1 tab with snack 06/08/19  Yes [provider]  folic acid (FOLVITE) 1 MG tablet Take 1 tablet (1 mg total) by mouth daily. 04/16/18  Yes Kayleen Memos, DO  gabapentin (NEURONTIN) 100 MG capsule Take 1 capsule (100 mg total) by mouth at bedtime as needed (pain). 04/25/19  Yes Geradine Girt, DO  megestrol (MEGACE) 40 MG/ML suspension  07/13/19  Yes [provider]  Methoxy PEG-Epoetin Beta (MIRCERA IJ) Mircera 07/11/19 07/09/20 Yes [provider]  metoprolol tartrate (LOPRESSOR) 25 MG tablet Take 0.5 tablets (12.5 mg total) by mouth 2 (two) times daily. 04/16/18  Yes Kayleen Memos, DO  multivitamin (RENA-VIT) TABS tablet Take 1 tablet by mouth 3 (three) times a week. 04/25/19  Yes Geradine Girt, DO  Nutritional Supplements (FEEDING SUPPLEMENT, NEPRO CARB STEADY,) LIQD Take 237 mLs by mouth 3 (three) times daily as needed (Supplement). 03/06/18  Yes Ghimire, Henreitta Leber, MD  olaparib (LYNPARZA) 100 MG tablet Take 1 tablet (100 mg total) by mouth 2 (two) times daily. Swallow whole. May take with food to decrease nausea and vomiting. 07/26/19  Yes Truitt Merle, MD  oxycodone (OXY-IR) 5 MG capsule Take 1-2 capsules (5-10 mg total) by mouth every  8 (eight) hours as needed. 07/13/19  Yes Truitt Merle, MD  pantoprazole (PROTONIX) 40 MG tablet TAKE 1 TABLET BY MOUTH TWICE A DAY 08/09/19  Yes Truitt Merle, MD  polyethylene glycol [facility]) packet Take 17 g by mouth daily. Patient taking differently: Take 17 g by mouth daily as needed for mild constipation.  03/06/18  Yes Ghimire, Henreitta Leber, MD  senna-docusate (SENOKOT-S) 8.6-50 MG tablet Take 2 tablets by mouth 2 (two) times daily. Patient taking differently: Take 2 tablets by mouth at bedtime as needed for mild constipation.  04/16/18   Kayleen Memos, DO  prochlorperazine (COMPAZINE) 10 MG tablet Take 1 tablet (10 mg total) by mouth every 6 (six) hours as needed (Nausea or vomiting). 05/10/18 02/22/19  Truitt Merle, MD    Allergies    Patient has no known allergies.  Review of Systems   Review of  Systems  Constitutional: Positive for fatigue. Negative for chills and fever.  HENT: Negative for ear pain and sore throat.   Eyes: Negative for pain and visual disturbance.  Respiratory: Negative for cough and shortness of breath.   Cardiovascular: Negative for chest pain and palpitations.  Gastrointestinal: Negative for abdominal pain and vomiting.  Genitourinary: Negative for dysuria and hematuria.  Musculoskeletal: Negative for arthralgias and back pain.  Skin: Negative for color change and rash.  Neurological: Positive for dizziness and light-headedness. Negative for seizures and syncope.  All other systems reviewed and are negative.   Physical Exam Updated Vital Signs BP (!) 151/84   Pulse 88   Temp 99.7 F (37.6 C) (Oral)   Resp (!) 21   Ht 5\' 6"  (1.676 m)   Wt 60.8 kg   SpO2 99%   BMI 21.64 kg/m   Physical Exam Vitals and nursing note reviewed. Exam conducted with a chaperone present.  Constitutional:      General: She is not in acute distress.    Appearance: She is well-developed.  HENT:     Head: Normocephalic and atraumatic.  Eyes:     Conjunctiva/sclera: Conjunctivae  normal.  Cardiovascular:     Rate and Rhythm: Normal rate and regular rhythm.     Heart sounds: No murmur.  Pulmonary:     Effort: Pulmonary effort is normal. No respiratory distress.     Breath sounds: Normal breath sounds.  Abdominal:     Palpations: Abdomen is soft.     Tenderness: There is no abdominal tenderness.  Genitourinary:    Rectum: Normal. Guaiac result negative.  Musculoskeletal:        General: No deformity or signs of injury.     Cervical back: Neck supple.  Skin:    General: Skin is warm and dry.  Neurological:     General: No focal deficit present.     Mental Status: She is alert and oriented to person, place, and time.     ED Results / Procedures / Treatments   Labs (all labs ordered are listed, but only abnormal results are displayed) Labs Reviewed  CBC WITH DIFFERENTIAL/PLATELET - Abnormal; Notable for the following components:      Result Value   RBC 3.18 (*)    Hemoglobin 7.8 (*)    HCT 26.6 (*)    MCH 24.5 (*)    MCHC 29.3 (*)    RDW 20.5 (*)    Lymphs Abs 0.5 (*)    Monocytes Absolute 1.1 (*)    All other components within normal limits  COMPREHENSIVE METABOLIC PANEL - Abnormal; Notable for the following components:   Sodium 133 (*)    Potassium 3.0 (*)    Chloride 93 (*)    Glucose, Bld 105 (*)    Creatinine, Ser 5.92 (*)    Calcium 7.7 (*)    Albumin 2.3 (*)    GFR calc non Af Amer 7 (*)    GFR calc Af Amer 8 (*)    All other components within normal limits  BRAIN NATRIURETIC PEPTIDE - Abnormal; Notable for the following components:   B Natriuretic Peptide 1,461.6 (*)    All other components within normal limits  D-DIMER, QUANTITATIVE (NOT AT [facility]) - Abnormal; Notable for the following components:   D-Dimer, Quant >20.00 (*)    All other components within normal limits  TROPONIN I (HIGH SENSITIVITY) - Abnormal; Notable for the following components:   Troponin I (High Sensitivity) 36 (*)  All other components within normal limits   TROPONIN I (HIGH SENSITIVITY) - Abnormal; Notable for the following components:   Troponin I (High Sensitivity) 36 (*)    All other components within normal limits  RESPIRATORY PANEL BY RT PCR (FLU A&B, COVID)  OCCULT BLOOD X 1 CARD TO LAB, STOOL  HEPARIN LEVEL (UNFRACTIONATED)  POC OCCULT BLOOD, ED    EKG EKG Interpretation  Date/Time:  Wednesday August 10 2019 06:50:05 EDT Ventricular Rate:  98 PR Interval:    QRS Duration: 86 QT Interval:  350 QTC Calculation: 447 R Axis:   51 Text Interpretation: Sinus rhythm Abnormal R-wave progression, early transition Consider left ventricular hypertrophy Baseline wander in lead(s) V2 Confirmed by Madalyn Rob [phone]) on 08/10/2019 7:09:39 AM   Radiology CT Angio Chest PE W and/or Wo Contrast  Result Date: 08/10/2019 CLINICAL DATA:  Shortness of breath. Near syncope. Dizziness. Elevated D-dimer. Intrahepatic cholangiocarcinoma. EXAM: CT ANGIOGRAPHY CHEST WITH CONTRAST TECHNIQUE: Multidetector CT imaging of the chest was performed using the standard protocol during bolus administration of intravenous contrast. Multiplanar CT image reconstructions and MIPs were obtained to evaluate the vascular anatomy. CONTRAST:  135mL OMNIPAQUE IOHEXOL 350 MG/ML SOLN COMPARISON:  Portable chest obtained earlier today. Chest, abdomen and pelvis CT dated 05/02/2019. FINDINGS: Cardiovascular: Single small right lower lobe pulmonary arterial filling defect. Stable enlarged heart. A previously demonstrated small pericardial effusion is no longer seen. Mediastinum/Nodes: No enlarged lymph nodes. The previously demonstrated 2 cm right lobe thyroid nodule is not as well visualized, currently measuring approximately 1.7 cm in maximum diameter with a better defined 1.0 cm oval low density component. Lungs/Pleura: Small right posterior diaphragmatic defect with herniated fat at the posteromedial right lung base. Minimal bilateral dependent atelectasis. Mild bilateral upper  lobe centrilobular bullous changes. No pleural fluid. Upper Abdomen: Multiple new liver masses of varying sizes. The previously demonstrated larger masses in the inferior right lobe of the liver are not currently included. Pronounced heterogeneity of the spleen compatible with early phase imaging. Small to moderate amount of free peritoneal fluid, increased. The included portion of the right kidney is small. Musculoskeletal: Thoracic and lower cervical spine degenerative changes. Review of the MIP images confirms the above findings. IMPRESSION: 1. Small right lower lobe pulmonary embolus without right heart strain. 2. Multiple new liver masses compatible with progressive metastatic disease 3. Mild changes of COPD with centrilobular emphysema in both upper lobes. 4. Stable right posterior diaphragmatic hernia containing herniated fat. 5. No interval increase in size of the previously demonstrated right lobe thyroid nodule. Critical Value/emergent results were called by telephone at the time of interpretation on 08/10/2019 at 10:40 am to provider [facility] , who verbally acknowledged these results. Emphysema (ICD10-J43.9). Electronically Signed   By: Claudie Revering M.D.   On: 08/10/2019 10:45   DG Chest Portable 1 View  Result Date: 08/10/2019 CLINICAL DATA:  Dizziness for 2 days.  Shortness of breath EXAM: PORTABLE CHEST 1 VIEW COMPARISON:  04/24/2019 FINDINGS: Mild cardiomegaly with aortic and hilar contours that are stable. Right port with tip in good position. There is no edema, consolidation, effusion, or pneumothorax. IMPRESSION: Stable exam.  No evidence of active disease. Electronically Signed   By: Monte Fantasia M.D.   On: 08/10/2019 07:19    Procedures .Critical Care Performed by: Lucrezia Starch, MD Authorized by: Lucrezia Starch, MD   Critical care provider statement:    Critical care time (minutes):  43   Critical care was necessary to treat or  prevent imminent or life-threatening  deterioration of the following conditions:  Respiratory failure   Critical care was time spent personally by me on the following activities:  Discussions with consultants, evaluation of patient's response to treatment, examination of patient, ordering and performing treatments and interventions, ordering and review of laboratory studies, ordering and review of radiographic studies, pulse oximetry, re-evaluation of patient's condition, obtaining history from patient or surrogate and review of old charts   (including critical care time)  Medications Ordered in ED Medications  heparin ADULT infusion 100 units/mL (25000 units/263mL sodium chloride 0.45%) (1,000 Units/hr Intravenous New Bag/Given 08/10/19 1125)  0.9 %  sodium chloride infusion (1,000 mLs Intravenous New Bag/Given 08/10/19 0743)  iohexol (OMNIPAQUE) 350 MG/ML injection 100 mL (100 mLs Intravenous Contrast Given 08/10/19 1003)  heparin bolus via infusion 4,000 Units (4,000 Units Intravenous Bolus from Bag 08/10/19 1126)    ED Course  I have reviewed the triage vital signs and the nursing notes.  Pertinent labs & imaging results that were available during my care of the patient were reviewed by me and considered in my medical decision making (see chart for details).  Clinical Course as of Aug 09 1204  Wed Aug 10, 2019  0930 Rectal, ruth to chaperone   [RD]  [phone] D/w nephrology, Johnney Ou - ok for CTA chest, if dc'd, patient needs to call to get appt today; can help if pt having issues   [RD]    Clinical Course User Index [RD] Lucrezia Starch, MD   MDM Rules/Calculators/A&P                      74 year old lady presenting to ER with generalized weakness, lightheadedness and shortness of breath.  On exam she was noted to be well-appearing, vital signs were stable.  EKG without acute ischemic change.  Labs grossly at baseline except CBC and dimer.  Hemoglobin slightly lower.  Patient denies any bleeding, Hemoccult negative.  Dimer  profoundly elevated.  CTA chest ordered to further evaluate.  This was discussed had a time with nephrology who is okay with proceeding with test.  CTA concerning for acute pulmonary embolism in right lower lobe.  Discussed risk and benefits of anticoagulation with patient.  We will proceed with heparin for now.  Discussed case with hospitalist service, Dr. Tamala Julian who will accept.  Final Clinical Impression(s) / ED Diagnoses Final diagnoses:  Other pulmonary embolism without acute cor pulmonale, unspecified chronicity ([place])    Rx / DC Orders ED Discharge Orders    None       Lucrezia Starch, MD 08/10/19 1217

## 2019-08-10 NOTE — Progress Notes (Signed)
ANTICOAGULATION CONSULT NOTE - Initial Consult  Pharmacy Consult for heparin Indication: pulmonary embolus  No Known Allergies  Patient Measurements: Height: 5\' 6"  (167.6 cm) Weight: 134 lb 1.6 oz (60.8 kg) IBW/kg (Calculated) : 59.3 Heparin Dosing Weight: 60 kg   Vital Signs: Temp: 99.7 F (37.6 C) (03/24 0643) Temp Source: Oral (03/24 0643) BP: 151/84 (03/24 1030) Pulse Rate: 88 (03/24 1030)  Labs: Recent Labs    08/10/19 0736 08/10/19 0859  HGB 7.8*  --   HCT 26.6*  --   PLT 331  --   CREATININE 5.92*  --   TROPONINIHS 36* 36*    Estimated Creatinine Clearance: 7.9 mL/min (A) (by C-G formula based on SCr of 5.92 mg/dL (H)).   Medical History: Past Medical History:  Diagnosis Date  . Diverticulitis   . Focal segmental glomerulosclerosis    ESRD on MWF HD  . Hypertension   . intrahepatic cholangio ca dx'd10/2019  . Renal insufficiency    dialysis pt MWF  . Tobacco abuse     Medications:  (Not in a hospital admission)   Assessment: 6 YOM with acute R lobe PE without right heart strain. H/H low, Plt wnl.  Goal of Therapy:  Heparin level 0.3-0.7 units/ml Monitor platelets by anticoagulation protocol: Yes   Plan:  -Heparin 4000 units IV bolus followed by heparin infusion at 1000 units/hr -F/u 8 hr HL -Monitor daily HL, CBC and s/s of bleeding  Albertina Parr, PharmD., BCPS Clinical Pharmacist Clinical phone for 08/10/19 until 3:30pm: [phone] If after 3:30pm, please refer to [facility] for unit-specific pharmacist

## 2019-08-10 NOTE — Consult Note (Addendum)
[facility] Renal Consultation Note    Indication for Consultation:  Management of ESRD/hemodialysis; anemia, hypertension/volume and secondary hyperparathyroidism  HPI: Sarann Tregre is a 74 y.o. female with ESRD on MWF HD with HTN and  cholangiocarcinoma (02-2018)with increasing weakness and today legs felt like cement blocks.  Managed to get into the tub and out with great effort today. Felt too bad to go to dialysis. Had chest pressure, SOB, ongoing abdominal pain and some cough.  Feeling some better now and legs are less heavy though hasn't tried to walk. Presented at [place] ED for evaluation and found to have a small RLL PE on CT angio, progressive liver masses and neg CXR Chemistries show NA 133 K 3 CO2 28 BUN 20 Cr 5.92 Ca 7.7 alb 2.3 LFT ok Trop 35 WBC 8.3 hgb 7.8 plts 331. LFT ok Abd CT two weeks ago shows progressive of hepatic lesions, mod ascites with mesenteric omental edema and diffuse body wall edema, stable 5 mm RLL pul nodule. She was transferred to [facility] for further evaluation and treatment and hemodialysis.  Past Medical History:  Diagnosis Date  . Diverticulitis   . Focal segmental glomerulosclerosis    ESRD on MWF HD  . Hypertension   . intrahepatic cholangio ca dx'd10/2019  . Renal insufficiency    dialysis pt MWF  . Tobacco abuse    Past Surgical History:  Procedure Laterality Date  . AV FISTULA PLACEMENT Left 03/18/2017   Procedure: Brachiocephalic ARTERIOVENOUS (AV) FISTULA CREATION left arm;  Surgeon: Elam Dutch, MD;  Location: [place];  Service: Vascular;  Laterality: Left;  . BIOPSY  03/05/2018   Procedure: BIOPSY;  Surgeon: Rush Landmark Telford Nab., MD;  Location: [place];  Service: Gastroenterology;;  enteroscopy bx /cytology brushing Greig Castilla bx  . COLONOSCOPY WITH PROPOFOL N/A 03/05/2018   Procedure: COLONOSCOPY WITH PROPOFOL;  Surgeon: Rush Landmark Telford Nab., MD;  Location: [place];  Service: Gastroenterology;   Laterality: N/A;  Bx, Spot  . ENTEROSCOPY N/A 03/05/2018   Procedure: ENTEROSCOPY;  Surgeon: Rush Landmark Telford Nab., MD;  Location: [place];  Service: Gastroenterology;  Laterality: N/A;  BX, Brushing, & Spot  . INSERTION OF DIALYSIS CATHETER N/A 03/18/2017   Procedure: INSERTION OF DIALYSIS CATHETER;  Surgeon: Elam Dutch, MD;  Location: [place];  Service: Vascular;  Laterality: N/A;  . IR GENERIC HISTORICAL  08/15/2016   IR US GUIDE VASC ACCESS RIGHT 08/15/2016 Arne Cleveland, MD [facility]  . IR GENERIC HISTORICAL  08/15/2016   IR FLUORO GUIDE CV LINE RIGHT 08/15/2016 Arne Cleveland, MD [facility]  . IR IMAGING GUIDED PORT INSERTION  05/06/2018  . IR RADIOLOGIST EVAL & MGMT  06/09/2019  . SUBMUCOSAL INJECTION  03/05/2018   Procedure: SUBMUCOSAL INJECTION;  Surgeon: Rush Landmark Telford Nab., MD;  Location: [place];  Service: Gastroenterology;;  spot tatoo  . TUBAL LIGATION     Family History  Problem Relation Age of Onset  . Hypertension Mother   . Diabetes Mother   . Hypertension Father   . Diabetes Father   . Hypertension Sister   . Hypertension Brother    Social History:  reports that she has been smoking cigarettes. She has a 5.00 pack-year smoking history. She has never used smokeless tobacco. She reports that she does not drink alcohol or use drugs. No Known Allergies Prior to Admission medications   Medication Sig Start Date End Date Taking? Authorizing Provider  amLODipine (NORVASC) 10 MG tablet Take 10 mg by mouth at bedtime.  09/15/18  Yes [provider]  AURYXIA 1 GM 210 MG(Fe) tablet Take 210-630 mg by mouth See admin instructions. 630mg  three times a day with meals and 210mg  with snacks 06/08/19  Yes [provider]  folic acid (FOLVITE) 1 MG tablet Take 1 tablet (1 mg total) by mouth daily. 04/16/18  Yes Kayleen Memos, DO  gabapentin (NEURONTIN) 100 MG capsule Take 1 capsule (100 mg total) by mouth at bedtime as needed (pain). 04/25/19   Yes Geradine Girt, DO  megestrol (MEGACE) 40 MG/ML suspension  07/13/19  Yes [provider]  Methoxy PEG-Epoetin Beta (MIRCERA IJ) Mircera 07/11/19 07/09/20 Yes [provider]  metoprolol tartrate (LOPRESSOR) 25 MG tablet Take 0.5 tablets (12.5 mg total) by mouth 2 (two) times daily. 04/16/18  Yes Kayleen Memos, DO  multivitamin (RENA-VIT) TABS tablet Take 1 tablet by mouth 3 (three) times a week. 04/25/19  Yes Geradine Girt, DO  Nutritional Supplements (FEEDING SUPPLEMENT, NEPRO CARB STEADY,) LIQD Take 237 mLs by mouth 3 (three) times daily as needed (Supplement). 03/06/18  Yes Ghimire, Henreitta Leber, MD  olaparib (LYNPARZA) 100 MG tablet Take 1 tablet (100 mg total) by mouth 2 (two) times daily. Swallow whole. May take with food to decrease nausea and vomiting. 07/26/19  Yes Truitt Merle, MD  oxycodone (OXY-IR) 5 MG capsule Take 1-2 capsules (5-10 mg total) by mouth every 8 (eight) hours as needed. 07/13/19  Yes Truitt Merle, MD  pantoprazole (PROTONIX) 40 MG tablet TAKE 1 TABLET BY MOUTH TWICE A DAY 08/09/19  Yes Truitt Merle, MD  polyethylene glycol [facility]) packet Take 17 g by mouth daily. Patient taking differently: Take 17 g by mouth daily as needed for mild constipation.  03/06/18  Yes Ghimire, Henreitta Leber, MD  senna-docusate (SENOKOT-S) 8.6-50 MG tablet Take 2 tablets by mouth 2 (two) times daily. Patient taking differently: Take 2 tablets by mouth at bedtime as needed for mild constipation.  04/16/18   Kayleen Memos, DO  prochlorperazine (COMPAZINE) 10 MG tablet Take 1 tablet (10 mg total) by mouth every 6 (six) hours as needed (Nausea or vomiting). 05/10/18 02/22/19  Truitt Merle, MD   Current Facility-Administered Medications  Medication Dose Route Frequency Provider Last Rate Last Admin  . heparin ADULT infusion 100 units/mL (25000 units/259mL sodium chloride 0.45%)  1,000 Units/hr Intravenous Continuous Lavenia Atlas, RPH 10 mL/hr at 08/10/19 1125 1,000 Units/hr at 08/10/19  1125   Labs: Basic Metabolic Panel: Recent Labs  Lab 08/10/19 0736  NA 133*  K 3.0*  CL 93*  CO2 28  GLUCOSE 105*  BUN 20  CREATININE 5.92*  CALCIUM 7.7*   Liver Function Tests: Recent Labs  Lab 08/10/19 0736  AST 17  ALT 8  ALKPHOS 122  BILITOT 0.7  PROT 6.7  ALBUMIN 2.3*   No results for input(s): LIPASE, AMYLASE in the last 168 hours. No results for input(s): AMMONIA in the last 168 hours. CBC: Recent Labs  Lab 08/10/19 0736  WBC 8.3  NEUTROABS 6.6  HGB 7.8*  HCT 26.6*  MCV 83.6  PLT 331   Cardiac Enzymes: No results for input(s): CKTOTAL, CKMB, CKMBINDEX, TROPONINI in the last 168 hours. CBG: No results for input(s): GLUCAP in the last 168 hours. Iron Studies: No results for input(s): IRON, TIBC, TRANSFERRIN, FERRITIN in the last 72 hours. Studies/Results: CT Angio Chest PE W and/or Wo Contrast  Result Date: 08/10/2019 CLINICAL DATA:  Shortness of breath. Near syncope. Dizziness. Elevated D-dimer. Intrahepatic cholangiocarcinoma.  EXAM: CT ANGIOGRAPHY CHEST WITH CONTRAST TECHNIQUE: Multidetector CT imaging of the chest was performed using the standard protocol during bolus administration of intravenous contrast. Multiplanar CT image reconstructions and MIPs were obtained to evaluate the vascular anatomy. CONTRAST:  178mL OMNIPAQUE IOHEXOL 350 MG/ML SOLN COMPARISON:  Portable chest obtained earlier today. Chest, abdomen and pelvis CT dated 05/02/2019. FINDINGS: Cardiovascular: Single small right lower lobe pulmonary arterial filling defect. Stable enlarged heart. A previously demonstrated small pericardial effusion is no longer seen. Mediastinum/Nodes: No enlarged lymph nodes. The previously demonstrated 2 cm right lobe thyroid nodule is not as well visualized, currently measuring approximately 1.7 cm in maximum diameter with a better defined 1.0 cm oval low density component. Lungs/Pleura: Small right posterior diaphragmatic defect with herniated fat at the  posteromedial right lung base. Minimal bilateral dependent atelectasis. Mild bilateral upper lobe centrilobular bullous changes. No pleural fluid. Upper Abdomen: Multiple new liver masses of varying sizes. The previously demonstrated larger masses in the inferior right lobe of the liver are not currently included. Pronounced heterogeneity of the spleen compatible with early phase imaging. Small to moderate amount of free peritoneal fluid, increased. The included portion of the right kidney is small. Musculoskeletal: Thoracic and lower cervical spine degenerative changes. Review of the MIP images confirms the above findings. IMPRESSION: 1. Small right lower lobe pulmonary embolus without right heart strain. 2. Multiple new liver masses compatible with progressive metastatic disease 3. Mild changes of COPD with centrilobular emphysema in both upper lobes. 4. Stable right posterior diaphragmatic hernia containing herniated fat. 5. No interval increase in size of the previously demonstrated right lobe thyroid nodule. Critical Value/emergent results were called by telephone at the time of interpretation on 08/10/2019 at 10:40 am to provider [facility] , who verbally acknowledged these results. Emphysema (ICD10-J43.9). Electronically Signed   By: Claudie Revering M.D.   On: 08/10/2019 10:45   DG Chest Portable 1 View  Result Date: 08/10/2019 CLINICAL DATA:  Dizziness for 2 days.  Shortness of breath EXAM: PORTABLE CHEST 1 VIEW COMPARISON:  04/24/2019 FINDINGS: Mild cardiomegaly with aortic and hilar contours that are stable. Right port with tip in good position. There is no edema, consolidation, effusion, or pneumothorax. IMPRESSION: Stable exam.  No evidence of active disease. Electronically Signed   By: Monte Fantasia M.D.   On: 08/10/2019 07:19    ROS: As per HPI with on going arorexia, small urine output, no current bowel issues; otherwise negative.  Physical Exam: Vitals:   08/10/19 1200 08/10/19 1230  08/10/19 1300 08/10/19 1403  BP: (!) 161/99 132/75 (!) 153/83 (!) 163/90  Pulse: 90 87 88 93  Resp: (!) 21 20 19    Temp:    98.8 F (37.1 C)  TempSrc:      SpO2: 99% 98% 97% 100%  Weight:      Height:         General: petite elderly female breathing easily at rest on O2 per [place]: NCAT sclera not icteric MMM Neck: Supple.  Lungs: CTA bilaterally without wheezes, rales, or rhonchi. Breathing is unlabored. Heart: RRR with S1 S2.  Abdomen: distended RUQ tenderness+ BS Lower extremities:without edema or ischemic changes, no open wounds  Neuro: A & O  X 3. Moves all extremities spontaneously. Psych:  Responds to questions appropriately with a normal affect. Dialysis Access: left uppper AVF + bruit - distal soft aneurysmal section  Dialysis Orders:  MWF HP 3.75 hr 400/800 2K 2.5 Ca hectorol 6 heparin 4000 with 2000 mid  tmt EDW 60 - getting below consistently 58.5 -59.5 Hectorol 6 Mircera 225 q 2 weeks - last 3/22  Assessment/Plan: 1. Acute PE - onset of sx past couple of days and worse this am am- on IV heparin via port - in settting of cancer - 2. ESRD -  MWF - K low today at 3.0 - no acute need for dialysis - plan HD Thursday first round--K low at outpt HD unit most recently 3. Hypertension/volume  - + ascites - leaving below EDW - need to lower EDW  4. Anemia  - hgb 7.8 down from 9.5 3/17 tsat 10% ferritin in Jan was 1400 - ESA just redosed Monday -  5. Metabolic bone disease -  Not on binders - continue Hectorol  6. Nutrition - ongoing anorexia and weight loss 7. Progressive cholangiocarcinoma - followed by Dr. Burr Medico; given current medical issues- need to revisit code status - currently full code. Note from Dr. Burr Medico 2/24 notes goals of care discussion with DNR/DNI - and make her daughter POA - this needs to be conveyed to her dialsysi unit after d/c   Myriam Jacobson, PA-C [address] [phone] 08/10/2019, 3:10 PM   Nephrology attending: Patient was seen and  examined at bedside.  Chart reviewed.  I agree with assessment and plan as outlined above. 74 year old female with history of cholangiocarcinoma admitted with acute PE.  She is ESRD on HD MWF.  Her volume status looks acceptable.  She has hypokalemia.  Blood pressure elevated.  Plan for HD today to continue her regular schedule however there is no urgency for dialysis need at the moment.  Further management of PE defer to primary medical service.  Katheran James, MD [facility].

## 2019-08-10 NOTE — Progress Notes (Deleted)
[facility] transfer Mary Chapman is a 74 y/o female with pmh ESRD on HD(M/W/F) static cholangiocarcinoma followed by Dr. Burr Medico who presented with complaints of weakness and shortness of breath.  Found to have an acute RLL PE w/o right heart strain. Hbg 7.8 (previously 10 on 2/24), but noted to be hemoccult negative.  Nephrology made aware as patient would need dialysis today.  Started on heparin drip.  TR[facility] called to admit.  Accepted to a telemetry bed here at [facility].

## 2019-08-10 NOTE — ED Notes (Signed)
ED Provider at bedside. 

## 2019-08-10 NOTE — ED Triage Notes (Signed)
Dizzy x 2 days, worse this am. Last dialysis  treatment was Monday, states felt to weak to go for HD this am . Productive cough x 2-3 days.

## 2019-08-10 NOTE — H&P (Signed)
History and Physical    [name] DOB: [birth year] DOA: 08/10/2019  Referring MD/NP/PA:Richard Roslynn Amble, MD PCP: Default, Provider, MD  Patient coming from: [facility] transfer  Chief Complaint: Shortness of breath  I have personally briefly reviewed patient's old medical records in [place]   HPI: Mary Chapman is a 74 y.o. female with medical history significant of HTN, intrahepatic cholangiocarcinoma, and ESRD on HD(M/W/F) presents with complaints of shortness of breath over the last 3 days.  History is obtained from from the patient, but her daughter who is present at bedside provides additional information.  Apparently, over the last week she has noted a progressive decline in the patient.  She has been more lethargic and sleeping more than usual.  Patient reports that her legs felt heavy like wood, she was dizzy, and felt like she was to stumbling about.  Associated symptoms include having productive cough, generalized weakness, decreased appetite, and dry heaves.  Denies having any falls or loss consciousness.  She is followed by Dr. Burr Medico for treatment of her cancer and has been prescribed ononolaparib.  However, patient reports that she ran out of this medicine sometime last week and was waiting for refill.  She had gone to dialysis today, but had not been dialyzed due to her symptoms when she was sent over to [place].  ED Course: On admission to the emergency department patient was noted to be afebrile and O2 saturations as low as 87% with improvement on 1 L of nasal cannula oxygen.  Labs significant for hemoglobin 7.8(previously 10 on 2/24), sodium 133, potassium 3, BUN 20, creatinine 5.92, BNP 1461.6, troponin 36->36.  CT angiogram of the chest revealed a right lower lobe pulmonary embolus without signs of right heart strain.  Nephrology had been made aware of the patient as he was due for dialysis.  Patient was started on heparin drip.  Review of Systems    Constitutional: Positive for malaise/fatigue. Negative for fever.  HENT: Negative for ear discharge and nosebleeds.   Eyes: Negative for photophobia.  Respiratory: Positive for shortness of breath. Negative for cough.   Cardiovascular: Negative for chest pain.  Gastrointestinal: Positive for nausea. Negative for diarrhea.       Positive for dry heaves  Genitourinary: Negative for dysuria and hematuria.  Musculoskeletal: Positive for myalgias. Negative for falls.  Neurological: Positive for weakness. Negative for focal weakness.  Psychiatric/Behavioral: Negative for memory loss.    Past Medical History:  Diagnosis Date  . Diverticulitis   . Focal segmental glomerulosclerosis    ESRD on MWF HD  . Hypertension   . intrahepatic cholangio ca dx'd10/2019  . Renal insufficiency    dialysis pt MWF  . Tobacco abuse     Past Surgical History:  Procedure Laterality Date  . AV FISTULA PLACEMENT Left 03/18/2017   Procedure: Brachiocephalic ARTERIOVENOUS (AV) FISTULA CREATION left arm;  Surgeon: Elam Dutch, MD;  Location: [place];  Service: Vascular;  Laterality: Left;  . BIOPSY  03/05/2018   Procedure: BIOPSY;  Surgeon: Rush Landmark Telford Nab., MD;  Location: [place];  Service: Gastroenterology;;  enteroscopy bx /cytology brushing Greig Castilla bx  . COLONOSCOPY WITH PROPOFOL N/A 03/05/2018   Procedure: COLONOSCOPY WITH PROPOFOL;  Surgeon: Rush Landmark Telford Nab., MD;  Location: [place];  Service: Gastroenterology;  Laterality: N/A;  Bx, Spot  . ENTEROSCOPY N/A 03/05/2018   Procedure: ENTEROSCOPY;  Surgeon: Rush Landmark Telford Nab., MD;  Location: [place];  Service: Gastroenterology;  Laterality: N/A;  BX, Brushing, &  Spot  . INSERTION OF DIALYSIS CATHETER N/A 03/18/2017   Procedure: INSERTION OF DIALYSIS CATHETER;  Surgeon: Elam Dutch, MD;  Location: [place];  Service: Vascular;  Laterality: N/A;  . IR GENERIC HISTORICAL  08/15/2016   IR US GUIDE VASC ACCESS RIGHT  08/15/2016 Arne Cleveland, MD [facility]INTER[facility]  . IR GENERIC HISTORICAL  08/15/2016   IR FLUORO GUIDE CV LINE RIGHT 08/15/2016 Arne Cleveland, MD [facility]  . IR IMAGING GUIDED PORT INSERTION  05/06/2018  . IR RADIOLOGIST EVAL & MGMT  06/09/2019  . SUBMUCOSAL INJECTION  03/05/2018   Procedure: SUBMUCOSAL INJECTION;  Surgeon: Rush Landmark Telford Nab., MD;  Location: [place];  Service: Gastroenterology;;  spot tatoo  . TUBAL LIGATION       reports that she has been smoking cigarettes. She has a 5.00 pack-year smoking history. She has never used smokeless tobacco. She reports that she does not drink alcohol or use drugs.  No Known Allergies  Family History  Problem Relation Age of Onset  . Hypertension Mother   . Diabetes Mother   . Hypertension Father   . Diabetes Father   . Hypertension Sister   . Hypertension Brother     Prior to Admission medications   Medication Sig Start Date End Date Taking? Authorizing Provider  amLODipine (NORVASC) 10 MG tablet Take 10 mg by mouth at bedtime.  09/15/18  Yes [provider]  AURYXIA 1 GM 210 MG(Fe) tablet Take 210-630 mg by mouth See admin instructions. 630mg  three times a day with meals and 210mg  with snacks 06/08/19  Yes [provider]  folic acid (FOLVITE) 1 MG tablet Take 1 tablet (1 mg total) by mouth daily. 04/16/18  Yes Kayleen Memos, DO  gabapentin (NEURONTIN) 100 MG capsule Take 1 capsule (100 mg total) by mouth at bedtime as needed (pain). 04/25/19  Yes Eulogio Bear U, DO  metoprolol tartrate (LOPRESSOR) 50 MG tablet Take 50 mg by mouth 2 (two) times daily.   Yes [provider]  multivitamin (RENA-VIT) TABS tablet Take 1 tablet by mouth 3 (three) times a week. Patient taking differently: Take 1 tablet by mouth 3 (three) times a week. MWF 04/25/19  Yes Vann, Tomi Bamberger, DO  Nutritional Supplements (FEEDING SUPPLEMENT, NEPRO CARB STEADY,) LIQD Take 237 mLs by mouth 3 (three) times daily as needed (Supplement).  03/06/18  Yes Ghimire, Henreitta Leber, MD  olaparib (LYNPARZA) 100 MG tablet Take 1 tablet (100 mg total) by mouth 2 (two) times daily. Swallow whole. May take with food to decrease nausea and vomiting. 07/26/19  Yes Truitt Merle, MD  oxycodone (OXY-IR) 5 MG capsule Take 1-2 capsules (5-10 mg total) by mouth every 8 (eight) hours as needed. Patient taking differently: Take 5-10 mg by mouth every 8 (eight) hours as needed for pain.  07/13/19  Yes Truitt Merle, MD  pantoprazole (PROTONIX) 40 MG tablet TAKE 1 TABLET BY MOUTH TWICE A DAY Patient taking differently: Take 40 mg by mouth 2 (two) times daily.  08/09/19  Yes Truitt Merle, MD  polyethylene glycol [facility]) packet Take 17 g by mouth daily. Patient taking differently: Take 17 g by mouth daily as needed for mild constipation.  03/06/18  Yes Ghimire, Henreitta Leber, MD  senna-docusate (SENOKOT-S) 8.6-50 MG tablet Take 2 tablets by mouth 2 (two) times daily. Patient taking differently: Take 2 tablets by mouth at bedtime as needed for mild constipation.  04/16/18  Yes Irene Pap N, DO  metoprolol tartrate (LOPRESSOR) 25 MG tablet Take  0.5 tablets (12.5 mg total) by mouth 2 (two) times daily. Patient not taking: Reported on 08/10/2019 04/16/18   Kayleen Memos, DO  prochlorperazine (COMPAZINE) 10 MG tablet Take 1 tablet (10 mg total) by mouth every 6 (six) hours as needed (Nausea or vomiting). 05/10/18 02/22/19  Truitt Merle, MD    Physical Exam:  Constitutional: Frail elderly female who appears to be in no acute distress at this time Vitals:   08/10/19 1200 08/10/19 1230 08/10/19 1300 08/10/19 1403  BP: (!) 161/99 132/75 (!) 153/83 (!) 163/90  Pulse: 90 87 88 93  Resp: (!) 21 20 19    Temp:    98.8 F (37.1 C)  TempSrc:      SpO2: 99% 98% 97% 100%  Weight:      Height:       Eyes: PERRL, lids and conjunctivae normal ENMT: Mucous membranes are moist. Posterior pharynx clear of any exudate or lesions. .  Neck: normal, supple, no masses, no  thyromegaly Respiratory: clear to auscultation bilaterally, no wheezing, no crackles. Normal respiratory effort. No accessory muscle use.  Patient able to talk in complete sentences currently on 1 L nasal cannula oxygen. Cardiovascular: Regular rate and rhythm, no murmurs / rubs / gallops. No extremity edema. 2+ pedal pulses. No carotid bruits.  Port present of the right upper chest wall. Abdomen: no tenderness, no masses palpated. No hepatosplenomegaly. Bowel sounds positive.  Musculoskeletal: no clubbing / cyanosis. No joint deformity upper and lower extremities. Good ROM, no contractures. Normal muscle tone.  Skin: no rashes, lesions, ulcers. No induration Neurologic: CN 2-12 grossly intact. Sensation intact, DTR normal. Strength 5/5 in all 4.  Psychiatric: Normal judgment and insight. Alert and oriented x 3. Normal mood.     Labs on Admission: I have personally reviewed following labs and imaging studies  CBC: Recent Labs  Lab 08/10/19 0736  WBC 8.3  NEUTROABS 6.6  HGB 7.8*  HCT 26.6*  MCV 83.6  PLT 431   Basic Metabolic Panel: Recent Labs  Lab 08/10/19 0736  NA 133*  K 3.0*  CL 93*  CO2 28  GLUCOSE 105*  BUN 20  CREATININE 5.92*  CALCIUM 7.7*   GFR: Estimated Creatinine Clearance: 7.9 mL/min (A) (by C-G formula based on SCr of 5.92 mg/dL (H)). Liver Function Tests: Recent Labs  Lab 08/10/19 0736  AST 17  ALT 8  ALKPHOS 122  BILITOT 0.7  PROT 6.7  ALBUMIN 2.3*   No results for input(s): LIPASE, AMYLASE in the last 168 hours. No results for input(s): AMMONIA in the last 168 hours. Coagulation Profile: No results for input(s): INR, PROTIME in the last 168 hours. Cardiac Enzymes: No results for input(s): CKTOTAL, CKMB, CKMBINDEX, TROPONINI in the last 168 hours. BNP (last 3 results) No results for input(s): PROBNP in the last 8760 hours. HbA1C: No results for input(s): HGBA1C in the last 72 hours. CBG: No results for input(s): GLUCAP in the last 168  hours. Lipid Profile: No results for input(s): CHOL, HDL, LDLCALC, TRIG, CHOLHDL, LDLDIRECT in the last 72 hours. Thyroid Function Tests: No results for input(s): TSH, T4TOTAL, FREET4, T3FREE, THYROIDAB in the last 72 hours. Anemia Panel: No results for input(s): VITAMINB12, FOLATE, FERRITIN, TIBC, IRON, RETICCTPCT in the last 72 hours. Urine analysis:    Component Value Date/Time   COLORURINE YELLOW 03/14/2017 2330   APPEARANCEUR CLOUDY (A) 03/14/2017 2330   LABSPEC 1.020 03/14/2017 2330   PHURINE 6.0 03/14/2017 2330   GLUCOSEU 100 (A) 03/14/2017 2330  HGBUR MODERATE (A) 03/14/2017 2330   BILIRUBINUR NEGATIVE 03/14/2017 2330   KETONESUR NEGATIVE 03/14/2017 2330   PROTEINUR >300 (A) 03/14/2017 2330   NITRITE NEGATIVE 03/14/2017 2330   LEUKOCYTESUR NEGATIVE 03/14/2017 2330   Sepsis Labs: Recent Results (from the past 240 hour(s))  Respiratory Panel by RT PCR (Flu A&B, Covid) - Nasopharyngeal Swab     Status: None   Collection Time: 08/10/19 11:02 AM   Specimen: Nasopharyngeal Swab  Result Value Ref Range Status   SARS Coronavirus 2 by RT PCR NEGATIVE NEGATIVE Final    Comment: (NOTE) SARS-CoV-2 target nucleic acids are NOT DETECTED. The SARS-CoV-2 RNA is generally detectable in upper respiratoy specimens during the acute phase of infection. The lowest concentration of SARS-CoV-2 viral copies this assay can detect is 131 copies/mL. A negative result does not preclude SARS-Cov-2 infection and should not be used as the sole basis for treatment or other patient management decisions. A negative result may occur with  improper specimen collection/handling, submission of specimen other than nasopharyngeal swab, presence of viral mutation(s) within the areas targeted by this assay, and inadequate number of viral copies (<131 copies/mL). A negative result must be combined with clinical observations, patient history, and epidemiological information. The expected result is  Negative. Fact Sheet for Patients:  PinkCheek.be Fact Sheet for Healthcare Providers:  GravelBags.it This test is not yet ap proved or cleared by the [place] FDA and  has been authorized for detection and/or diagnosis of SARS-CoV-2 by FDA under an Emergency Use Authorization (EUA). This EUA will remain  in effect (meaning this test can be used) for the duration of the COVID-19 declaration under Section 564(b)(1) of the Act, 21 U.S.C. section 360bbb-3(b)(1), unless the authorization is terminated or revoked sooner.    Influenza A by PCR NEGATIVE NEGATIVE Final   Influenza B by PCR NEGATIVE NEGATIVE Final    Comment: (NOTE) The Xpert Xpress SARS-CoV-2/FLU/RSV assay is intended as an aid in  the diagnosis of influenza from Nasopharyngeal swab specimens and  should not be used as a sole basis for treatment. Nasal washings and  aspirates are unacceptable for Xpert Xpress SARS-CoV-2/FLU/RSV  testing. Fact Sheet for Patients: PinkCheek.be Fact Sheet for Healthcare Providers: GravelBags.it This test is not yet approved or cleared by the [place] FDA and  has been authorized for detection and/or diagnosis of SARS-CoV-2 by  FDA under an Emergency Use Authorization (EUA). This EUA will remain  in effect (meaning this test can be used) for the duration of the  Covid-19 declaration under Section 564(b)(1) of the Act, 21  U.S.C. section 360bbb-3(b)(1), unless the authorization is  terminated or revoked. Performed at [facility], [address] [place], [place] [address]      Radiological Exams on Admission: CT Angio Chest PE W and/or Wo Contrast  Result Date: 08/10/2019 CLINICAL DATA:  Shortness of breath. Near syncope. Dizziness. Elevated D-dimer. Intrahepatic cholangiocarcinoma. EXAM: CT ANGIOGRAPHY CHEST WITH CONTRAST TECHNIQUE: Multidetector CT  imaging of the chest was performed using the standard protocol during bolus administration of intravenous contrast. Multiplanar CT image reconstructions and MIPs were obtained to evaluate the vascular anatomy. CONTRAST:  159mL OMNIPAQUE IOHEXOL 350 MG/ML SOLN COMPARISON:  Portable chest obtained earlier today. Chest, abdomen and pelvis CT dated 05/02/2019. FINDINGS: Cardiovascular: Single small right lower lobe pulmonary arterial filling defect. Stable enlarged heart. A previously demonstrated small pericardial effusion is no longer seen. Mediastinum/Nodes: No enlarged lymph nodes. The previously demonstrated 2 cm right lobe thyroid nodule  is not as well visualized, currently measuring approximately 1.7 cm in maximum diameter with a better defined 1.0 cm oval low density component. Lungs/Pleura: Small right posterior diaphragmatic defect with herniated fat at the posteromedial right lung base. Minimal bilateral dependent atelectasis. Mild bilateral upper lobe centrilobular bullous changes. No pleural fluid. Upper Abdomen: Multiple new liver masses of varying sizes. The previously demonstrated larger masses in the inferior right lobe of the liver are not currently included. Pronounced heterogeneity of the spleen compatible with early phase imaging. Small to moderate amount of free peritoneal fluid, increased. The included portion of the right kidney is small. Musculoskeletal: Thoracic and lower cervical spine degenerative changes. Review of the MIP images confirms the above findings. IMPRESSION: 1. Small right lower lobe pulmonary embolus without right heart strain. 2. Multiple new liver masses compatible with progressive metastatic disease 3. Mild changes of COPD with centrilobular emphysema in both upper lobes. 4. Stable right posterior diaphragmatic hernia containing herniated fat. 5. No interval increase in size of the previously demonstrated right lobe thyroid nodule. Critical Value/emergent results were called  by telephone at the time of interpretation on 08/10/2019 at 10:40 am to provider [facility] , who verbally acknowledged these results. Emphysema (ICD10-J43.9). Electronically Signed   By: Claudie Revering M.D.   On: 08/10/2019 10:45   DG Chest Portable 1 View  Result Date: 08/10/2019 CLINICAL DATA:  Dizziness for 2 days.  Shortness of breath EXAM: PORTABLE CHEST 1 VIEW COMPARISON:  04/24/2019 FINDINGS: Mild cardiomegaly with aortic and hilar contours that are stable. Right port with tip in good position. There is no edema, consolidation, effusion, or pneumothorax. IMPRESSION: Stable exam.  No evidence of active disease. Electronically Signed   By: Monte Fantasia M.D.   On: 08/10/2019 07:19    EKG: Independently reviewed.  Sinus rhythm at 98 bpm  Assessment/Plan Pulmonary embolism: Acute.  Patient presents with complaints of shortness of breath and weakness.  Found to have pulmonary embolus of the right lower lobe of the lung without signs of right heart strain. -Admit to a medical telemetry bed -Strict bedrest -Continuous pulse oximetry with nasal cannula oxygen as needed -Continue heparin drip per pharmacy -Continue oxycodone as needed for pain -Check echocardiogram and Doppler ultrasound of lower extremity -Determine best form of anticoagulation in a.m.  Elevated troponin: Chronic .  High-sensitivity troponins flat at 36.  Suspect secondary to demand with acute PE.  Patient denies any complaints of chest pain at this time.  Hypochromic anemia: Hemoglobin 7.8 with MCH 24.5, but hemoglobin previously noted to be 10 on 2/24.  Patient was given ESA during dialysis on Monday. -Recheck H&H on anticoagulation  ESRD on HD: Patient normally dialyzes M/W/F potassium 3, BUN 20, creatinine 5.92.  Nephrology formally consulted. -HD per nephro  Hypokalemia: Acute.  Initial potassium noted to be 3. -Give 30 mEq of potassium chloride x1 dose -Continue to monitor placed as needed  Intrahepatic  cholangiocarcinoma: Patient followed in outpatient setting by Dr. Burr Medico.  Medications treatment included onolaparib, but patient had been off this medication for least 1 week.  Appears this medication can also put patient at risk for pulmonary embolus.  Was unable to reach Dr. Burr Medico in the office at this time. -Continue shakes with meals -Held onolaparib and discuss medication with Dr. Burr Medico  Essential hypertension: Home blood pressure medications include amlodipine 10 mg daily, and metoprolol 50 mg twice daily. -Continue current home regimen as tolerated  GERD -Continue protonix  DVT prophylaxis: Heparin  Code Status:  Patient confirmed that she would like to remain a full code at this time, but previously had been DNR Family Communication: Discussed plan of care with the patient daughter over the phone Disposition Plan: Possible discharge home in 1-2 Consults called: Nephrology Admission status: Observation  Norval Morton MD [facility] Pager [phone]   If 7PM-7AM, please contact night-coverage [email or web address] Password [facility]  08/10/2019, 3:45 PM

## 2019-08-10 NOTE — Plan of Care (Signed)
[facility] transfer Mary Chapman is a 74 y/o female with pmh ESRD on HD(M/W/F) static cholangiocarcinoma followed by Dr. Burr Medico who presented with complaints of weakness and shortness of breath.  Found to have an acute RLL PE w/o right heart strain. Hbg 7.8 (previously 10 on 2/24), but noted to be hemoccult negative.  Nephrology made aware as patient would need dialysis today.  Started on heparin drip.  TRH called to admit.  Accepted to a telemetry bed here at [facility].

## 2019-08-11 ENCOUNTER — Observation Stay (HOSPITAL_BASED_OUTPATIENT_CLINIC_OR_DEPARTMENT_OTHER): Payer: Medicare Other

## 2019-08-11 ENCOUNTER — Inpatient Hospital Stay: Payer: Medicare Other

## 2019-08-11 ENCOUNTER — Inpatient Hospital Stay: Payer: Medicare Other | Admitting: Hematology

## 2019-08-11 DIAGNOSIS — R509 Fever, unspecified: Secondary | ICD-10-CM | POA: Diagnosis not present

## 2019-08-11 DIAGNOSIS — D631 Anemia in chronic kidney disease: Secondary | ICD-10-CM

## 2019-08-11 DIAGNOSIS — Z992 Dependence on renal dialysis: Secondary | ICD-10-CM | POA: Diagnosis not present

## 2019-08-11 DIAGNOSIS — I2699 Other pulmonary embolism without acute cor pulmonale: Secondary | ICD-10-CM | POA: Diagnosis not present

## 2019-08-11 DIAGNOSIS — J9601 Acute respiratory failure with hypoxia: Secondary | ICD-10-CM | POA: Diagnosis not present

## 2019-08-11 DIAGNOSIS — I2602 Saddle embolus of pulmonary artery with acute cor pulmonale: Secondary | ICD-10-CM

## 2019-08-11 DIAGNOSIS — J9600 Acute respiratory failure, unspecified whether with hypoxia or hypercapnia: Secondary | ICD-10-CM

## 2019-08-11 DIAGNOSIS — C221 Intrahepatic bile duct carcinoma: Secondary | ICD-10-CM | POA: Diagnosis not present

## 2019-08-11 DIAGNOSIS — N186 End stage renal disease: Secondary | ICD-10-CM | POA: Diagnosis not present

## 2019-08-11 LAB — HEPARIN LEVEL (UNFRACTIONATED)
Heparin Unfractionated: 0.25 IU/mL — ABNORMAL LOW (ref 0.30–0.70)
Heparin Unfractionated: >2.2 IU/mL — ABNORMAL HIGH (ref 0.30–0.70)
Heparin Unfractionated: <0.1 IU/mL — ABNORMAL LOW (ref 0.30–0.70)

## 2019-08-11 LAB — RENAL FUNCTION PANEL
Albumin: 2 g/dL — ABNORMAL LOW (ref 3.5–5.0)
Albumin: 2.1 g/dL — ABNORMAL LOW (ref 3.5–5.0)
Anion gap: 11 (ref 5–15)
Anion gap: 10 (ref 5–15)
BUN: 17 mg/dL (ref 8–23)
BUN: 7 mg/dL — ABNORMAL LOW (ref 8–23)
CO2: 27 mmol/L (ref 22–32)
CO2: 30 mmol/L (ref 22–32)
Calcium: 7.5 mg/dL — ABNORMAL LOW (ref 8.9–10.3)
Calcium: 7.6 mg/dL — ABNORMAL LOW (ref 8.9–10.3)
Chloride: 97 mmol/L — ABNORMAL LOW (ref 98–111)
Chloride: 97 mmol/L — ABNORMAL LOW (ref 98–111)
Creatinine, Ser: 5.4 mg/dL — ABNORMAL HIGH (ref 0.44–1.00)
Creatinine, Ser: 3.18 mg/dL — ABNORMAL HIGH (ref 0.44–1.00)
GFR calc Af Amer: 8 mL/min — ABNORMAL LOW (ref >60)
GFR calc Af Amer: 16 mL/min — ABNORMAL LOW (ref >60)
GFR calc non Af Amer: 7 mL/min — ABNORMAL LOW (ref >60)
GFR calc non Af Amer: 14 mL/min — ABNORMAL LOW (ref >60)
Glucose, Bld: 88 mg/dL (ref 70–99)
Glucose, Bld: 115 mg/dL — ABNORMAL HIGH (ref 70–99)
Phosphorus: 2 mg/dL — ABNORMAL LOW (ref 2.5–4.6)
Phosphorus: 1.2 mg/dL — ABNORMAL LOW (ref 2.5–4.6)
Potassium: 3.5 mmol/L (ref 3.5–5.1)
Potassium: 3.5 mmol/L (ref 3.5–5.1)
Sodium: 135 mmol/L (ref 135–145)
Sodium: 137 mmol/L (ref 135–145)

## 2019-08-11 LAB — CBC
HCT: 26.6 % — ABNORMAL LOW (ref 36.0–46.0)
Hemoglobin: 7.7 g/dL — ABNORMAL LOW (ref 12.0–15.0)
MCH: 24.1 pg — ABNORMAL LOW (ref 26.0–34.0)
MCHC: 28.9 g/dL — ABNORMAL LOW (ref 30.0–36.0)
MCV: 83.4 fL (ref 80.0–100.0)
Platelets: 322 10*3/uL (ref 150–400)
RBC: 3.19 MIL/uL — ABNORMAL LOW (ref 3.87–5.11)
RDW: 20.7 % — ABNORMAL HIGH (ref 11.5–15.5)
WBC: 5.5 10*3/uL (ref 4.0–10.5)
nRBC: 0 % (ref 0.0–0.2)

## 2019-08-11 LAB — ECHOCARDIOGRAM COMPLETE
Height: 66 in
Weight: 2028.23 oz

## 2019-08-11 MED ORDER — WARFARIN SODIUM 2.5 MG PO TABS
2.5000 mg | ORAL_TABLET | Freq: Once | ORAL | Status: AC
  Administered 2019-08-11: 2.5 mg via ORAL
  Filled 2019-08-11: qty 1

## 2019-08-11 MED ORDER — HEPARIN (PORCINE) 25000 UT/250ML-% IV SOLN
1100.0000 [IU]/h | INTRAVENOUS | Status: DC
  Administered 2019-08-11: 1000 [IU]/h via INTRAVENOUS

## 2019-08-11 MED ORDER — WARFARIN - PHARMACIST DOSING INPATIENT: Freq: Every day | Status: DC

## 2019-08-11 NOTE — Care Management Obs Status (Signed)
[facility] NOTIFICATION   Patient Details  Name: Mary Chapman MRN: [id number] Date of Birth: 1946-02-04   Medicare Observation Status Notification Given:  Yes    Angelita Ingles, RN 08/11/2019, 3:19 PM

## 2019-08-11 NOTE — Progress Notes (Addendum)
ANTICOAGULATION CONSULT NOTE - Follow Up Consult  Pharmacy Consult for Heparin > Warfarin Indication: pulmonary embolus  No Known Allergies  Patient Measurements: Height: 5\' 6"  (167.6 cm) Weight: 126 lb 12.2 oz (57.5 kg) IBW/kg (Calculated) : 59.3 Heparin Dosing Weight: 60 kg   Vital Signs: Temp: 98.8 F (37.1 C) (03/25 0859) Temp Source: Oral (03/25 0859) BP: 132/75 (03/25 0859) Pulse Rate: 82 (03/25 0859)  Labs: Recent Labs    08/10/19 0736 08/10/19 0859 08/10/19 2000 08/11/19 0218 08/11/19 0219 08/11/19 0658  HGB 7.8*  --   --  7.7*  --   --   HCT 26.6*  --   --  26.6*  --   --   PLT 331  --   --  322  --   --   HEPARINUNFRC  --   --  0.71*  --   --  0.25*  CREATININE 5.92*  --   --   --  5.40* 3.18*  TROPONINIHS 36* 36*  --   --   --   --     Estimated Creatinine Clearance: 14.3 mL/min (A) (by C-G formula based on SCr of 3.18 mg/dL (H)).   Medical History: Past Medical History:  Diagnosis Date  . Diverticulitis   . Focal segmental glomerulosclerosis    ESRD on MWF HD  . Hypertension   . intrahepatic cholangio ca dx'd10/2019  . Renal insufficiency    dialysis pt MWF  . Tobacco abuse     Assessment: 74 yr old female with acute R lobe PE without right heart strain. Pharmacy consulted to dose heparin for anticoagulation.   Heparin level this morning on heparin infusion at 950 units/hr was slightly SUBtherapeutic (HL 0.25 units/ml, goal of 0.3-0.7 unit/ml). Hgb/Hct low but stable, plts wnl. Per discussion RN - the heparin bag was exchanged right before the lab was drawn as the patient returned from HD. It is unclear if the bag had run dry or was still infusing. Heparin infusion was increased to 1000 units/hr.  Heparin level ~ 6 hrs after heparin infusion was increased to 1000 units/hr was >2.20 units/ml. Per RN, the heparin level was drawn from the port in which the heparin was infusing (heparin infusion was stopped ~5 mins and line was flushed before the  level was drawn). RN was instructed to pause heparin infusion while repeat stat (peripheral stick by lab) heparin level was drawn. Heparin level drawn just after infusion was paused was <0.10 units/ml. It is likely that both heparin levels are erroneous. Given that pt has PE, will restart heparin infusion at prior rate (1000 units/hr) and check heparin level in 8 hrs (peripheral stick by lab). CBC stable. Per RN, no issues with bleeding observed.  Pharmacy is now consulted to dose warfarin for PE. Baseline albumin 2.1, pt is on no meds known to interact with warfarin.  Goal of Therapy:  Heparin level 0.3-0.7 units/ml Monitor platelets by anticoagulation protocol: Yes   Plan:  Heparin infusion at 1000 units/hr Check heparin level in 8 hours Warfarin 2.5 mg PO X 1 this evening Monitor daily heparin level, INR, CBC Monitor for signs/symptoms of bleeding Stop heparin infusion when INR >2  Gillermina Hu, PharmD, BCPS, [facility] Clinical Pharmacist 08/11/2019 5:41 PM

## 2019-08-11 NOTE — Progress Notes (Addendum)
HEMATOLOGY-ONCOLOGY PROGRESS NOTE  SUBJECTIVE: The patient presented to the hospital for shortness of breath.  O2 sats were 87% on room air.  Labs on admission significant for hemoglobin of 7.8, potassium 3.0, creatinine 5.92, calcium 7.7, albumin 2.3.  CT angiogram of the chest showed a small right lower lobe pulmonary embolus without right heart strain, multiple new liver masses compatible with progressive metastatic disease.  She has been started on a heparin drip.  Reports that her breathing has improved.  She denies chest pain today.  She has no other complaints today.  Oncology History  Intrahepatic cholangiocarcinoma ([place])  02/23/2018 Imaging   CT AP W Contrast 02/23/18  IMPRESSION: 1. Large multi-cystic lesion in the central aspect of the liver adjacent to the gallbladder fossa, with several smaller satellite lesions. These are all new compared to prior CT the chest, abdomen and pelvis 08/07/2016, concerning for intrahepatic abscesses. 2. Extensive mural thickening and inflammatory changes in the region of the cecum. This is nonspecific, and could reflect either right-sided diverticulitis, focal area of colitis, or potentially even underlying neoplasm. 3. Cholelithiasis. Gallbladder is nearly completely contracted without surrounding inflammatory changes to suggest an acute cholecystitis at this time. 4. Left-sided nephrolithiasis measuring up to 8 mm in the upper pole collecting system of left kidney. No ureteral stones or findings of urinary tract obstruction. 5. Aortic atherosclerosis.   02/27/2018 Initial Biopsy   Diagnosis 02/27/18  Liver, needle/core biopsy, Right Hepatic Lobe - ADENOCARCINOMA. SEE NOTE.   02/27/2018 Miscellaneous     03/05/2018 Procedure   Colonoscopy 03/05/18  IMPRESSION - Preparation of the colon was fair. - Non-thrombosed external hemorrhoids found on digital rectal exam. - There was significant looping of the colon. - Severe diverticulosis in  the recto-sigmoid colon, in the sigmoid colon, in the descending colon, in the transverse colon, at the hepatic flexure, in the ascending colon and in the cecum. There was no evidence of diverticular bleeding. - A single (solitary) ulcer in the cecum - highly concerning for underlying malignancy. Biopsied. Phlegmonous change is possible, though often in setting of complicated diverticulosis/diverticulitis ulceration would not be as common. - Erythematous mucosa in the transverse colon, at the hepatic flexure, in the ascending colon and in the cecum. Biopsied. - Normal mucosa in the rectum, in the recto-sigmoid colon, in the sigmoid colon and in the descending colon. Biopsied. - Non-bleeding non-thrombosed external and internal hemorrhoids. -----Negative for malignancy    03/11/2018 Initial Diagnosis   Intrahepatic cholangiocarcinoma ([place])   04/02/2018 Imaging   CT CAP W contrast 04/02/18  IMPRESSION: 1. 4 mm subpleural nodule in the periphery of the right lower lobe. This is nonspecific, but strongly favored to represent a benign subpleural lymph node. Attention on follow-up studies is recommended to exclude the possibility of metastatic disease. 2. Diffuse bronchial wall thickening with moderate centrilobular and mild paraseptal emphysema; imaging findings suggestive of underlying COPD. 3. Aortic atherosclerosis, in addition to left main and left anterior descending coronary artery disease. Assessment for potential risk factor modification, dietary therapy or pharmacologic therapy may be warranted, if clinically indicated. 4. Nonobstructive calculi in the upper pole collecting system of left kidney measuring up to 7 mm.    04/27/2018 Cancer Staging   Staging form: Intrahepatic Bile Duct, AJCC 8th Edition - Clinical: Stage IIIB (cT2, cN1, cM0) - Signed by Truitt Merle, MD on 04/27/2018   05/18/2018 - 02/10/2019 Chemotherapy   Gemcitabine and Cisplatin every 2 weeks starting on  05/18/2018, with 50% dose reduction. D/c after 02/10/19  due to disease progression.     07/19/2018 Imaging   CT CAP W CONTRAST  IMPRESSION: 1. Dominant inferior liver mass is decreased in size. Two subcentimeter clustered low-attenuation lesions in the far inferior right liver lobe, not discretely visualized on the prior noncontrast CT study, can not exclude new small satellite hepatic metastases. 2. Porta hepatis adenopathy is mildly decreased. 3. Small right pulmonary nodules are stable. 4. No additional potential new or progressive metastatic disease. 5. Small pericardial effusion, slightly increased. 6. Cholelithiasis. 7. Moderate diffuse colonic diverticulosis. 8. Aortic Atherosclerosis (ICD10-I70.0) and Emphysema (ICD10-J43.9).    11/02/2018 Imaging   CT CAP WO contrast  IMPRESSION: 1. The dominant right hepatic lobe mass and most of the smaller right hepatic lobe lesions are stable. One of the right hepatic lobe lesions appears to have increased in size, formerly about 1.1 by 1.2 cm and currently 1.9 by 1.5 cm. 2. Two small right lower lobe pulmonary nodules are stable in size. 3. A hypodense right thyroid nodule has enlarged, currently 1.9 by 1.2 cm and formerly 1.1 by 0.7 cm. Consider thyroid ultrasound for further evaluation. 4. Other imaging findings of potential clinical significance: Aortic Atherosclerosis (ICD10-I70.0) and Emphysema (ICD10-J43.9). Coronary atherosclerosis. Mild cardiomegaly. Low-density blood pool suggests anemia. Nonobstructive left nephrolithiasis. Colonic diverticulosis.   02/22/2019 Imaging   CT CAP WO Contrast  IMPRESSION: 1. Interval increase in size of 2 now dominant hepatic lesions consistent with disease progression. Assessment of liver parenchyma limited by noncontrast study. 2. Stable right lower lobe pulmonary nodule. 2nd pulmonary nodule seen previously not identified today. 3. Stable 20 mm right thyroid nodule. 4.  Emphysema.  (ICD10-J43.9) 5.  Aortic Atherosclerois (ICD10-170.0)   02/24/2019 - 04/21/2019 Chemotherapy   Second-line FOLFOX  with dose reduction starting 02/24/19. D/c due to disease progression in Liver. Last dose 04/21/19.    05/02/2019 Imaging   CT CAP WO Contrast IMPRESSION: 1. Continued mild progression of the dominant central and smaller inferior right liver lesions. 2. Haziness and nodularity in the right omentum, adjacent to the inferior right liver is stable. 3. Tiny subpleural nodule right lower lobe is stable. 4. Stable 2 cm right thyroid nodule. Continued attention on follow-up recommended. 5.  Emphysema. (ICD10-J43.9) 6.  Aortic Atherosclerois (ICD10-170.0)   06/07/2019 -  Chemotherapy   Oral Lynparza 100mg  BID starting on 06/07/19   08/02/2019 Imaging   CT AP Wo contrast  IMPRESSION: 1. Interval progression of the large ill-defined hypoattenuating lesion in the central liver with progression of inferior right hepatic lesions. 2. Moderate volume ascites with mesenteric/omental edema and with diffuse body wall edema. 3. Stable 5 mm right lower lobe pulmonary nodule. 4. Nonobstructing left renal stones. 5. Small left groin hernia contains only fat. 6. Aortic Atherosclerosis (ICD10-I70.0).        REVIEW OF SYSTEMS:   Constitutional: Denies fevers, chills or abnormal weight loss Eyes: Denies blurriness of vision Ears, nose, mouth, throat, and face: Denies mucositis or sore throat Respiratory: Reports shortness of breath which has improved Cardiovascular: Denies palpitation, chest discomfort Gastrointestinal: Denies nausea and vomiting.  Has intermittent mild abdominal discomfort which is controlled with oral pain medication Skin: Denies abnormal skin rashes Lymphatics: Denies new lymphadenopathy or easy bruising Neurological:Denies numbness, tingling or new weaknesses Behavioral/Psych: Mood is stable, no new changes  Extremities: No lower extremity edema All other systems  were reviewed with the patient and are negative.  I have reviewed the past medical history, past surgical history, social history and family history with the patient  and they are unchanged from previous note.   PHYSICAL EXAMINATION: ECOG PERFORMANCE STATUS: 2 - Symptomatic, <50% confined to bed  Vitals:   08/11/19 0512 08/11/19 0859  BP: 137/71 132/75  Pulse: 76 82  Resp: 16 17  Temp: 97.6 F (36.4 C) 98.8 F (37.1 C)  SpO2: 100% 100%   Filed Weights   08/10/19 0645 08/11/19 0128 08/11/19 0512  Weight: 60.8 kg 59.4 kg 57.5 kg    Intake/Output from previous day: 03/24 0701 - 03/25 0700 In: -  Out: 2000   GENERAL:alert, no distress and comfortable LUNGS: clear to auscultation and percussion with normal breathing effort HEART: regular rate & rhythm and no murmurs and no lower extremity edema ABDOMEN:  Positive bowel sounds, mild tenderness over the right upper quadrant, mild distention Musculoskeletal:no cyanosis of digits and no clubbing  NEURO: alert & oriented x 3 with fluent speech, no focal motor/sensory deficits  LABORATORY DATA:  I have reviewed the data as listed CMP Latest Ref Rng & Units 08/11/2019 08/11/2019 08/10/2019  Glucose 70 - 99 mg/dL 115(H) 88 105(H)  BUN 8 - 23 mg/dL 7(L) 17 20  Creatinine 0.44 - 1.00 mg/dL 3.18(H) 5.40(H) 5.92(H)  Sodium 135 - 145 mmol/L 137 135 133(L)  Potassium 3.5 - 5.1 mmol/L 3.5 3.5 3.0(L)  Chloride 98 - 111 mmol/L 97(L) 97(L) 93(L)  CO2 22 - 32 mmol/L 30 27 28   Calcium 8.9 - 10.3 mg/dL 7.6(L) 7.5(L) 7.7(L)  Total Protein 6.5 - 8.1 g/dL - - 6.7  Total Bilirubin 0.3 - 1.2 mg/dL - - 0.7  Alkaline Phos 38 - 126 U/L - - 122  AST 15 - 41 U/L - - 17  ALT 0 - 44 U/L - - 8    Lab Results  Component Value Date   WBC 5.5 08/11/2019   HGB 7.7 (L) 08/11/2019   HCT 26.6 (L) 08/11/2019   MCV 83.4 08/11/2019   PLT 322 08/11/2019   NEUTROABS 6.6 08/10/2019    CT Abdomen Pelvis Wo Contrast  Result Date: 08/02/2019 CLINICAL DATA:   Intrahepatic cholangiocarcinoma diagnosed in 2019. Right-sided abdominal pain with nausea and vomiting. EXAM: CT ABDOMEN AND PELVIS WITHOUT CONTRAST TECHNIQUE: Multidetector CT imaging of the abdomen and pelvis was performed following the standard protocol without IV contrast. COMPARISON:  05/02/2019 FINDINGS: Lower chest: 5 mm right lower lobe subpleural nodule on 16/6 is stable. Hepatobiliary: The large ill-defined hypoattenuating lesion in the central liver has progressed in the interval measuring 12.3 x 7.9 cm today compared to 8.6 x 5.8 cm previously. Lesion now largely replaces segment IV B. inferior right hepatic lesions are also progressive with 4.4 cm lesion on 38/2 increased from 2.6 cm previously. Gallbladder not visualized and presumed surgically absent. Periportal edema noted. No extrahepatic biliary duct dilatation. Pancreas: No focal mass lesion. No dilatation of the main duct. No intraparenchymal cyst. No peripancreatic edema. Spleen: No splenomegaly. No focal mass lesion. Adrenals/Urinary Tract: No adrenal nodule or mass. Right kidney unremarkable. Nonobstructing stones again noted left kidney. No hydroureter. Bladder decompressed. Stomach/Bowel: Stomach is unremarkable. No gastric wall thickening. No evidence of outlet obstruction. Duodenum is normally positioned as is the ligament of Treitz. No small bowel wall thickening. No small bowel dilatation. The terminal ileum is normal. The appendix is normal. Diverticuli are seen scattered along the entire length of the colon without CT findings of diverticulitis. Vascular/Lymphatic: There is abdominal aortic atherosclerosis without aneurysm. Probable small lymph nodes in the gastrohepatic ligament and hepatoduodenal ligament although assessment markedly limited  due to lack of intravenous contrast material. No bulky retroperitoneal lymphadenopathy. No pelvic sidewall lymphadenopathy. Reproductive: The uterus is unremarkable.  There is no adnexal mass.  Other: Moderate volume ascites with diffuse mesenteric edema. Musculoskeletal: Small left groin hernia contains only fat. Diffuse body wall edema evident. No worrisome lytic or sclerotic osseous abnormality. IMPRESSION: 1. Interval progression of the large ill-defined hypoattenuating lesion in the central liver with progression of inferior right hepatic lesions. 2. Moderate volume ascites with mesenteric/omental edema and with diffuse body wall edema. 3. Stable 5 mm right lower lobe pulmonary nodule. 4. Nonobstructing left renal stones. 5. Small left groin hernia contains only fat. 6. Aortic Atherosclerosis (ICD10-I70.0). Electronically Signed   By: Misty Stanley M.D.   On: 08/02/2019 13:43   CT Angio Chest PE W and/or Wo Contrast  Result Date: 08/10/2019 CLINICAL DATA:  Shortness of breath. Near syncope. Dizziness. Elevated D-dimer. Intrahepatic cholangiocarcinoma. EXAM: CT ANGIOGRAPHY CHEST WITH CONTRAST TECHNIQUE: Multidetector CT imaging of the chest was performed using the standard protocol during bolus administration of intravenous contrast. Multiplanar CT image reconstructions and MIPs were obtained to evaluate the vascular anatomy. CONTRAST:  12mL OMNIPAQUE IOHEXOL 350 MG/ML SOLN COMPARISON:  Portable chest obtained earlier today. Chest, abdomen and pelvis CT dated 05/02/2019. FINDINGS: Cardiovascular: Single small right lower lobe pulmonary arterial filling defect. Stable enlarged heart. A previously demonstrated small pericardial effusion is no longer seen. Mediastinum/Nodes: No enlarged lymph nodes. The previously demonstrated 2 cm right lobe thyroid nodule is not as well visualized, currently measuring approximately 1.7 cm in maximum diameter with a better defined 1.0 cm oval low density component. Lungs/Pleura: Small right posterior diaphragmatic defect with herniated fat at the posteromedial right lung base. Minimal bilateral dependent atelectasis. Mild bilateral upper lobe centrilobular bullous  changes. No pleural fluid. Upper Abdomen: Multiple new liver masses of varying sizes. The previously demonstrated larger masses in the inferior right lobe of the liver are not currently included. Pronounced heterogeneity of the spleen compatible with early phase imaging. Small to moderate amount of free peritoneal fluid, increased. The included portion of the right kidney is small. Musculoskeletal: Thoracic and lower cervical spine degenerative changes. Review of the MIP images confirms the above findings. IMPRESSION: 1. Small right lower lobe pulmonary embolus without right heart strain. 2. Multiple new liver masses compatible with progressive metastatic disease 3. Mild changes of COPD with centrilobular emphysema in both upper lobes. 4. Stable right posterior diaphragmatic hernia containing herniated fat. 5. No interval increase in size of the previously demonstrated right lobe thyroid nodule. Critical Value/emergent results were called by telephone at the time of interpretation on 08/10/2019 at 10:40 am to provider [facility] , who verbally acknowledged these results. Emphysema (ICD10-J43.9). Electronically Signed   By: Claudie Revering M.D.   On: 08/10/2019 10:45   DG Chest Portable 1 View  Result Date: 08/10/2019 CLINICAL DATA:  Dizziness for 2 days.  Shortness of breath EXAM: PORTABLE CHEST 1 VIEW COMPARISON:  04/24/2019 FINDINGS: Mild cardiomegaly with aortic and hilar contours that are stable. Right port with tip in good position. There is no edema, consolidation, effusion, or pneumothorax. IMPRESSION: Stable exam.  No evidence of active disease. Electronically Signed   By: Monte Fantasia M.D.   On: 08/10/2019 07:19   ECHOCARDIOGRAM COMPLETE  Result Date: 08/11/2019    ECHOCARDIOGRAM REPORT   Patient Name:   Mary Chapman Date of Exam: 08/11/2019 Medical Rec #:  [id number]   Height:       66.0 in Accession #:  [id number]  Weight:       126.8 lb Date of Birth:  08/14/1945  BSA:          1.647 m  Patient Age:    56 years    BP:           137/71 mmHg Patient Gender: F           HR:           83 bpm. Exam Location:  Inpatient Procedure: 2D Echo, 3D Echo, Color Doppler, Cardiac Doppler and Strain Analysis Indications:    I26.02 Pulmonary embolus  History:        Patient has prior history of Echocardiogram examinations, most                 recent 08/08/2016. Risk Factors:Hypertension. Liver Cancer.  Sonographer:    Raquel Sarna Senior RDCS Referring Phys: [phone] [place]  1. Normal LV systolic function; mild LVH; grade 1 diastolic dysfunction; moderate LAE.  2. Left ventricular ejection fraction, by estimation, is 50 to 55%. The left ventricle has low normal function. The left ventricle has no regional wall motion abnormalities. There is mild left ventricular hypertrophy. Left ventricular diastolic parameters are consistent with Grade I diastolic dysfunction (impaired relaxation).  3. Right ventricular systolic function is normal. The right ventricular size is normal.  4. Left atrial size was moderately dilated.  5. The mitral valve is normal in structure. Trivial mitral valve regurgitation. No evidence of mitral stenosis.  6. The aortic valve is normal in structure. Aortic valve regurgitation is not visualized. No aortic stenosis is present.  7. The inferior vena cava is normal in size with greater than 50% respiratory variability, suggesting right atrial pressure of 3 mmHg. FINDINGS  Left Ventricle: Left ventricular ejection fraction, by estimation, is 50 to 55%. The left ventricle has low normal function. The left ventricle has no regional wall motion abnormalities. The left ventricular internal cavity size was normal in size. There is mild left ventricular hypertrophy. Left ventricular diastolic parameters are consistent with Grade I diastolic dysfunction (impaired relaxation). Right Ventricle: The right ventricular size is normal.Right ventricular systolic function is normal. Left Atrium: Left  atrial size was moderately dilated. Right Atrium: Right atrial size was normal in size. Pericardium: Trivial pericardial effusion is present. Mitral Valve: The mitral valve is normal in structure. Normal mobility of the mitral valve leaflets. Trivial mitral valve regurgitation. No evidence of mitral valve stenosis. Tricuspid Valve: The tricuspid valve is normal in structure. Tricuspid valve regurgitation is trivial. No evidence of tricuspid stenosis. Aortic Valve: The aortic valve is normal in structure. Aortic valve regurgitation is not visualized. No aortic stenosis is present. Pulmonic Valve: The pulmonic valve was normal in structure. Pulmonic valve regurgitation is not visualized. No evidence of pulmonic stenosis. Aorta: The aortic root is normal in size and structure. Venous: The inferior vena cava is normal in size with greater than 50% respiratory variability, suggesting right atrial pressure of 3 mmHg. IAS/Shunts: No atrial level shunt detected by color flow Doppler. Additional Comments: Normal LV systolic function; mild LVH; grade 1 diastolic dysfunction; moderate LAE.  LEFT VENTRICLE PLAX 2D LVIDd:         4.70 cm      Diastology LVIDs:         3.70 cm      LV e' lateral:   5.77 cm/s LV PW:         1.30 cm      LV  E/e' lateral: 8.9 LV IVS:        0.90 cm      LV e' medial:    5.98 cm/s LVOT diam:     1.90 cm      LV E/e' medial:  8.6 LV SV:         51 LV SV Index:   31 LVOT Area:     2.84 cm  LV Volumes (MOD) LV vol d, MOD A2C: 95.8 ml LV vol d, MOD A4C: 118.0 ml LV vol s, MOD A2C: 52.3 ml LV vol s, MOD A4C: 65.3 ml LV SV MOD A2C:     43.5 ml LV SV MOD A4C:     118.0 ml LV SV MOD BP:      46.1 ml RIGHT VENTRICLE RV S prime:     11.90 cm/s TAPSE (M-mode): 2.3 cm LEFT ATRIUM             Index       RIGHT ATRIUM           Index LA diam:        2.70 cm 1.64 cm/m  RA Area:     19.00 cm LA Vol (A2C):   81.1 ml 49.23 ml/m RA Volume:   53.30 ml  32.35 ml/m LA Vol (A4C):   65.0 ml 39.45 ml/m LA Biplane Vol:  72.9 ml 44.25 ml/m  AORTIC VALVE LVOT Vmax:   101.00 cm/s LVOT Vmean:  70.700 cm/s LVOT VTI:    0.180 m  AORTA Ao Root diam: 2.70 cm Ao Asc diam:  3.20 cm MITRAL VALVE MV Area (PHT): 2.56 cm    SHUNTS MV Decel Time: 296 msec    Systemic VTI:  0.18 m MV E velocity: 51.40 cm/s  Systemic Diam: 1.90 cm MV A velocity: 92.00 cm/s MV E/A ratio:  0.56 Kirk Ruths MD Electronically signed by Kirk Ruths MD Signature Date/Time: 08/11/2019/11:46:37 AM    Final    VAS [place] LOWER EXTREMITY VENOUS (DVT)  Result Date: 08/11/2019  Lower Venous DVTStudy Indications: Pulmonary embolism.  Comparison Study: no prior Performing Technologist: Abram Sander RVS  Examination Guidelines: A complete evaluation includes B-mode imaging, spectral Doppler, color Doppler, and power Doppler as needed of all accessible portions of each vessel. Bilateral testing is considered an integral part of a complete examination. Limited examinations for reoccurring indications may be performed as noted. The reflux portion of the exam is performed with the patient in reverse Trendelenburg.  +---------+---------------+---------+-----------+----------+--------------+ RIGHT    CompressibilityPhasicitySpontaneityPropertiesThrombus Aging +---------+---------------+---------+-----------+----------+--------------+ CFV      Full           Yes      Yes                                 +---------+---------------+---------+-----------+----------+--------------+ SFJ      Full                                                        +---------+---------------+---------+-----------+----------+--------------+ FV Prox  Full                                                        +---------+---------------+---------+-----------+----------+--------------+  FV Mid   Full                                                        +---------+---------------+---------+-----------+----------+--------------+ FV DistalFull                                                         +---------+---------------+---------+-----------+----------+--------------+ PFV      Full                                                        +---------+---------------+---------+-----------+----------+--------------+ POP      Full           Yes      Yes                                 +---------+---------------+---------+-----------+----------+--------------+ PTV      Full                                                        +---------+---------------+---------+-----------+----------+--------------+ PERO     Full                                                        +---------+---------------+---------+-----------+----------+--------------+   +---------+---------------+---------+-----------+----------+--------------+ LEFT     CompressibilityPhasicitySpontaneityPropertiesThrombus Aging +---------+---------------+---------+-----------+----------+--------------+ CFV      Full           Yes      Yes                                 +---------+---------------+---------+-----------+----------+--------------+ SFJ      Full                                                        +---------+---------------+---------+-----------+----------+--------------+ FV Prox  Full                                                        +---------+---------------+---------+-----------+----------+--------------+ FV Mid   Full                                                        +---------+---------------+---------+-----------+----------+--------------+  FV DistalFull                                                        +---------+---------------+---------+-----------+----------+--------------+ PFV      Full                                                        +---------+---------------+---------+-----------+----------+--------------+ POP      Full           Yes      Yes                                  +---------+---------------+---------+-----------+----------+--------------+ PTV      Full                                                        +---------+---------------+---------+-----------+----------+--------------+ PERO     Full                                                        +---------+---------------+---------+-----------+----------+--------------+     Summary: BILATERAL: - No evidence of deep vein thrombosis seen in the lower extremities, bilaterally.   *See table(s) above for measurements and observations.    Preliminary     ASSESSMENT AND PLAN: 1.  Adenocarcinoma in the liver, likely intrahepatic cholangiocarcinoma 2.  Pulmonary embolism 3.  Abdominal pain secondary to #1, controlled 4.  End-stage renal disease 5.  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  Hypertension  -Discussed the recent restaging CT scan with the patient and her daughter.  We discussed that the scan showed disease progression.  Recommend for her to discontinue Lynparza at this time.  Discussed proceeding with Y 90 by IR versus referral to hospice.  The patient states that she would like to try Y 90.  She understands this will be an outpatient procedure and will refer her back to interventional radiology when she is discharged.  We discussed getting palliative care involved as an outpatient.  She would like to pursue this. -For her new PE, continue heparin drip.  Given her renal function, will likely need to discharge on Coumadin.  Further recommendations per Dr. Burr Medico later today. -Hemoglobin is stable today.  No evidence of bleeding.  Transfuse PRBCs for hemoglobin less than 7.   LOS: 0 days   Mikey Bussing, DNP, AGPCNP-BC, AOCNP 08/11/19  Addendum  I have seen the patient, examined her. I agree with the assessment and and plan and have edited the notes.   I reviewed her recent restaging CT scan in person, and discussed the findings with patient and her daughter today.  Fortunately she has  had disease progression, will stop [place]  She has progressed through multiple lines systemic therapy, due to her recent rapid  deterioration, poor performance status, medical comorbidities especially end-stage renal disease on dialysis, the treatment option is very limited and I do not recommend more systemic therapy. I discussed the option of radial embolization, which also require good performance status and it will take some time to get it done, we have to reevaluate if she would be a candidate in the next few weeks.  I spent a quite a bit time on discussion of palliative care and hospice, especially the benefit, logistics, and differences between these 2 services.  I think she is a candidate for hospice, but hospice may not cover hemodialysis, so pt is not ready to consider hospice at this point. We discussed code status and she agreed with DNR today.   We discussed the option of anticoagulation after discharge. Coumadin is preferred, but it will require frequent INR monitoring, and I am hoping she can get INR checked at HD center, will confirm with nephology service. The other option is Eliquis 2.5mg  bid when she is on HD. I will send a message to Dr. Joelyn Oms to get his input.   Will try low dose marinol for her nausea and low appetite.   I will f/u with her after discharge.   Truitt Merle  08/11/2019

## 2019-08-11 NOTE — Progress Notes (Signed)
Lower extremity venous has been completed.   Preliminary results in CV Proc.   Abram Sander 08/11/2019 8:33 AM

## 2019-08-11 NOTE — Progress Notes (Signed)
ANTICOAGULATION CONSULT NOTE - Follow Up Consult  Pharmacy Consult for heparin Indication: pulmonary embolus  No Known Allergies  Patient Measurements: Height: 5\' 6"  (167.6 cm) Weight: 126 lb 12.2 oz (57.5 kg) IBW/kg (Calculated) : 59.3 Heparin Dosing Weight: 60 kg   Vital Signs: Temp: 97.6 F (36.4 C) (03/25 0512) Temp Source: Oral (03/25 0512) BP: 137/71 (03/25 0512) Pulse Rate: 76 (03/25 0512)  Labs: Recent Labs    08/10/19 0736 08/10/19 0859 08/10/19 2000 08/11/19 0218 08/11/19 0219 08/11/19 0658  HGB 7.8*  --   --  7.7*  --   --   HCT 26.6*  --   --  26.6*  --   --   PLT 331  --   --  322  --   --   HEPARINUNFRC  --   --  0.71*  --   --  0.25*  CREATININE 5.92*  --   --   --  5.40*  --   TROPONINIHS 36* 36*  --   --   --   --     Estimated Creatinine Clearance: 8.4 mL/min (A) (by C-G formula based on SCr of 5.4 mg/dL (H)).   Medical History: Past Medical History:  Diagnosis Date  . Diverticulitis   . Focal segmental glomerulosclerosis    ESRD on MWF HD  . Hypertension   . intrahepatic cholangio ca dx'd10/2019  . Renal insufficiency    dialysis pt MWF  . Tobacco abuse     Assessment: 74 yr old female with acute R lobe PE without right heart strain. Pharmacy consulted to dose heparin for anticoagulation.   Heparin level this morning is slightly SUBtherapeutic (HL 0.25, goal of 0.3-0.7). Hgb/Hct low but stable, plts wnl. Per discussion w/ the RN - the drip was exchanged right before the lab was drawn as the patient returned from HD. It is unclear if the bag had run dry or was still infusing. Will increase but not aggressively.  Goal of Therapy:  Heparin level 0.3-0.7 units/ml Monitor platelets by anticoagulation protocol: Yes   Plan:  - Increase Heparin to 1000 units/hr (10 ml/hr) - Will continue to monitor for any signs/symptoms of bleeding and will follow up with heparin level in 8 hours  Thank you for allowing pharmacy to be a part of this  patient's care.  Alycia Rossetti, PharmD, BCPS Clinical Pharmacist 08/11/2019 7:39 AM   **Pharmacist phone directory can now be found on [email or web address] ([email or web address]).  Listed under [place].

## 2019-08-11 NOTE — Progress Notes (Signed)
Echocardiogram 2D Echocardiogram has been performed.  Oneal Deputy Linwood Gullikson 08/11/2019, 8:53 AM

## 2019-08-11 NOTE — Progress Notes (Signed)
Progress Note    Mary Chapman  BOF:[id number] DOB: [birth year]  DOA: 08/10/2019 PCP: Default, Provider, MD    Brief Narrative:    Medical records reviewed and are as summarized below:  Mary Chapman is an 74 y.o. female with medical history significant of HTN, intrahepatic cholangiocarcinoma, and ESRD on HD(M/W/F) presents with complaints of shortness of breath over the last 3 days.  Patient was found to have pulmonary emboli on CT angiogram of the chest.  No right heart strain was noted.  Duplex of lower extremities were negative for DVT.  Echo pending.  Patient has been started on heparin drip  Assessment/Plan:   Principal Problem:   Pulmonary embolism (HCC) Active Problems:   Essential hypertension   Elevated troponin   ESRD on dialysis (HCC)   Intrahepatic cholangiocarcinoma (HCC)   GERD (gastroesophageal reflux disease)   Hypokalemia   Anemia due to end stage renal disease (HCC)   Acute respiratory failure (HCC)   Acute respiratory failure with hypoxemia -Wean oxygen to off -O2 sats were documented around 87% -Due to pulmonary emboli  Acute pulmonary embolism -Patient placed on heparin drip -Wean oxygen as tolerated -Lower extremity Dopplers were negative for DVT -Echo done but has not been read -Will confer with oncology about best anticoagulation for patient: ? Coumadin since she is a dialysis patient  Elevated troponin -Troponin flat -Suspect secondary to demand with acute pulmonary emboli  Anemia of chronic disease -Hemoglobin was 7.8, prior was 10 in February of this year -Patient given ESA on Monday -Occult stool negative  End-stage renal disease on hemodialysis -Monday Wednesday Friday -Nephrology consulted with hemodialysis last p.m.  Intrahepatic cholangiocarcinoma:  -Patient followed in outpatient setting by Dr. Burr Medico.  Medications treatment included onolaparib, but patient had been off this medication for least 1 week.  Appears this medication can  also put patient at risk for pulmonary embolus.   -Held onolaparib and discuss medication with Dr. Burr Medico -Plan to confer with oncology regarding best anticoagulation agent for patient      Family Communication/Anticipated D/C date and plan/Code Status   DVT prophylaxis: Heparin drip Code Status: Full Code.  Family Communication: Daughter at bedside Disposition Plan: Pending oncology consultation and recommendations of anticoagulation   Medical Consultants:    Nephrology  Oncology   Subjective:   Patient tired, states she went to dialysis overnight  Objective:    Vitals:   08/11/19 0430 08/11/19 0500 08/11/19 0512 08/11/19 0859  BP: (!) 111/45 104/73 137/71 132/75  Pulse: 70 76 76 82  Resp: 15 16 16 17   Temp:   97.6 F (36.4 C) 98.8 F (37.1 C)  TempSrc:   Oral Oral  SpO2:   100% 100%  Weight:   57.5 kg   Height:        Intake/Output Summary (Last 24 hours) at 08/11/2019 1031 Last data filed at 08/11/2019 7782 Gross per 24 hour  Intake --  Output 2000 ml  Net -2000 ml   Filed Weights   08/10/19 0645 08/11/19 0128 08/11/19 0512  Weight: 60.8 kg 59.4 kg 57.5 kg    Exam: In bed, younger than stated age Regular rate and rhythm Coarse breath sounds throughout No lower extremity edema, no Homans' sign, no redness Alert and orient x3   Data Reviewed:   I have personally reviewed following labs and imaging studies:  Labs: Labs show the following:   Basic Metabolic Panel: Recent Labs  Lab 08/10/19 0736 08/10/19 0736 08/11/19 0219 08/11/19 4235  NA 133*  --  135 137  K 3.0*   < > 3.5 3.5  CL 93*  --  97* 97*  CO2 28  --  27 30  GLUCOSE 105*  --  88 115*  BUN 20  --  17 7*  CREATININE 5.92*  --  5.40* 3.18*  CALCIUM 7.7*  --  7.5* 7.6*  PHOS  --   --  2.0* 1.2*   < > = values in this interval not displayed.   GFR Estimated Creatinine Clearance: 14.3 mL/min (A) (by C-G formula based on SCr of 3.18 mg/dL (H)). Liver Function Tests: Recent  Labs  Lab 08/10/19 0736 08/11/19 0219 08/11/19 0658  AST 17  --   --   ALT 8  --   --   ALKPHOS 122  --   --   BILITOT 0.7  --   --   PROT 6.7  --   --   ALBUMIN 2.3* 2.0* 2.1*   No results for input(s): LIPASE, AMYLASE in the last 168 hours. No results for input(s): AMMONIA in the last 168 hours. Coagulation profile No results for input(s): INR, PROTIME in the last 168 hours.  CBC: Recent Labs  Lab 08/10/19 0736 08/11/19 0218  WBC 8.3 5.5  NEUTROABS 6.6  --   HGB 7.8* 7.7*  HCT 26.6* 26.6*  MCV 83.6 83.4  PLT 331 322   Cardiac Enzymes: No results for input(s): CKTOTAL, CKMB, CKMBINDEX, TROPONINI in the last 168 hours. BNP (last 3 results) No results for input(s): PROBNP in the last 8760 hours. CBG: No results for input(s): GLUCAP in the last 168 hours. D-Dimer: Recent Labs    08/10/19 0736  DDIMER >20.00*   Hgb A1c: No results for input(s): HGBA1C in the last 72 hours. Lipid Profile: No results for input(s): CHOL, HDL, LDLCALC, TRIG, CHOLHDL, LDLDIRECT in the last 72 hours. Thyroid function studies: No results for input(s): TSH, T4TOTAL, T3FREE, THYROIDAB in the last 72 hours.  Invalid input(s): FREET3 Anemia work up: No results for input(s): VITAMINB12, FOLATE, FERRITIN, TIBC, IRON, RETICCTPCT in the last 72 hours. Sepsis Labs: Recent Labs  Lab 08/10/19 0736 08/11/19 0218  WBC 8.3 5.5    Microbiology Recent Results (from the past 240 hour(s))  Respiratory Panel by RT PCR (Flu A&B, Covid) - Nasopharyngeal Swab     Status: None   Collection Time: 08/10/19 11:02 AM   Specimen: Nasopharyngeal Swab  Result Value Ref Range Status   SARS Coronavirus 2 by RT PCR NEGATIVE NEGATIVE Final    Comment: (NOTE) SARS-CoV-2 target nucleic acids are NOT DETECTED. The SARS-CoV-2 RNA is generally detectable in upper respiratoy specimens during the acute phase of infection. The lowest concentration of SARS-CoV-2 viral copies this assay can detect is 131 copies/mL.  A negative result does not preclude SARS-Cov-2 infection and should not be used as the sole basis for treatment or other patient management decisions. A negative result may occur with  improper specimen collection/handling, submission of specimen other than nasopharyngeal swab, presence of viral mutation(s) within the areas targeted by this assay, and inadequate number of viral copies (<131 copies/mL). A negative result must be combined with clinical observations, patient history, and epidemiological information. The expected result is Negative. Fact Sheet for Patients:  PinkCheek.be Fact Sheet for Healthcare Providers:  GravelBags.it This test is not yet ap proved or cleared by the [place] FDA and  has been authorized for detection and/or diagnosis of SARS-CoV-2 by FDA under an Emergency Use  Authorization (EUA). This EUA will remain  in effect (meaning this test can be used) for the duration of the COVID-19 declaration under Section 564(b)(1) of the Act, 21 U.S.C. section 360bbb-3(b)(1), unless the authorization is terminated or revoked sooner.    Influenza A by PCR NEGATIVE NEGATIVE Final   Influenza B by PCR NEGATIVE NEGATIVE Final    Comment: (NOTE) The Xpert Xpress SARS-CoV-2/FLU/RSV assay is intended as an aid in  the diagnosis of influenza from Nasopharyngeal swab specimens and  should not be used as a sole basis for treatment. Nasal washings and  aspirates are unacceptable for Xpert Xpress SARS-CoV-2/FLU/RSV  testing. Fact Sheet for Patients: PinkCheek.be Fact Sheet for Healthcare Providers: GravelBags.it This test is not yet approved or cleared by the [place] FDA and  has been authorized for detection and/or diagnosis of SARS-CoV-2 by  FDA under an Emergency Use Authorization (EUA). This EUA will remain  in effect (meaning this test can be used)  for the duration of the  Covid-19 declaration under Section 564(b)(1) of the Act, 21  U.S.C. section 360bbb-3(b)(1), unless the authorization is  terminated or revoked. Performed at [facility], [address] [place], [place] [address]     Procedures and diagnostic studies:  CT Angio Chest PE W and/or Wo Contrast  Result Date: 08/10/2019 CLINICAL DATA:  Shortness of breath. Near syncope. Dizziness. Elevated D-dimer. Intrahepatic cholangiocarcinoma. EXAM: CT ANGIOGRAPHY CHEST WITH CONTRAST TECHNIQUE: Multidetector CT imaging of the chest was performed using the standard protocol during bolus administration of intravenous contrast. Multiplanar CT image reconstructions and MIPs were obtained to evaluate the vascular anatomy. CONTRAST:  175mL OMNIPAQUE IOHEXOL 350 MG/ML SOLN COMPARISON:  Portable chest obtained earlier today. Chest, abdomen and pelvis CT dated 05/02/2019. FINDINGS: Cardiovascular: Single small right lower lobe pulmonary arterial filling defect. Stable enlarged heart. A previously demonstrated small pericardial effusion is no longer seen. Mediastinum/Nodes: No enlarged lymph nodes. The previously demonstrated 2 cm right lobe thyroid nodule is not as well visualized, currently measuring approximately 1.7 cm in maximum diameter with a better defined 1.0 cm oval low density component. Lungs/Pleura: Small right posterior diaphragmatic defect with herniated fat at the posteromedial right lung base. Minimal bilateral dependent atelectasis. Mild bilateral upper lobe centrilobular bullous changes. No pleural fluid. Upper Abdomen: Multiple new liver masses of varying sizes. The previously demonstrated larger masses in the inferior right lobe of the liver are not currently included. Pronounced heterogeneity of the spleen compatible with early phase imaging. Small to moderate amount of free peritoneal fluid, increased. The included portion of the right kidney is small. Musculoskeletal:  Thoracic and lower cervical spine degenerative changes. Review of the MIP images confirms the above findings. IMPRESSION: 1. Small right lower lobe pulmonary embolus without right heart strain. 2. Multiple new liver masses compatible with progressive metastatic disease 3. Mild changes of COPD with centrilobular emphysema in both upper lobes. 4. Stable right posterior diaphragmatic hernia containing herniated fat. 5. No interval increase in size of the previously demonstrated right lobe thyroid nodule. Critical Value/emergent results were called by telephone at the time of interpretation on 08/10/2019 at 10:40 am to provider [facility] , who verbally acknowledged these results. Emphysema (ICD10-J43.9). Electronically Signed   By: Claudie Revering M.D.   On: 08/10/2019 10:45   DG Chest Portable 1 View  Result Date: 08/10/2019 CLINICAL DATA:  Dizziness for 2 days.  Shortness of breath EXAM: PORTABLE CHEST 1 VIEW COMPARISON:  04/24/2019 FINDINGS: Mild cardiomegaly with aortic and  hilar contours that are stable. Right port with tip in good position. There is no edema, consolidation, effusion, or pneumothorax. IMPRESSION: Stable exam.  No evidence of active disease. Electronically Signed   By: Monte Fantasia M.D.   On: 08/10/2019 07:19   VAS [place] LOWER EXTREMITY VENOUS (DVT)  Result Date: 08/11/2019  Lower Venous DVTStudy Indications: Pulmonary embolism.  Comparison Study: no prior Performing Technologist: Abram Sander RVS  Examination Guidelines: A complete evaluation includes B-mode imaging, spectral Doppler, color Doppler, and power Doppler as needed of all accessible portions of each vessel. Bilateral testing is considered an integral part of a complete examination. Limited examinations for reoccurring indications may be performed as noted. The reflux portion of the exam is performed with the patient in reverse Trendelenburg.  +---------+---------------+---------+-----------+----------+--------------+  RIGHT      Compressibility Phasicity Spontaneity Properties Thrombus Aging  +---------+---------------+---------+-----------+----------+--------------+  CFV       Full            Yes       Yes                                    +---------+---------------+---------+-----------+----------+--------------+  SFJ       Full                                                             +---------+---------------+---------+-----------+----------+--------------+  FV Prox   Full                                                             +---------+---------------+---------+-----------+----------+--------------+  FV Mid    Full                                                             +---------+---------------+---------+-----------+----------+--------------+  FV Distal Full                                                             +---------+---------------+---------+-----------+----------+--------------+  PFV       Full                                                             +---------+---------------+---------+-----------+----------+--------------+  POP       Full            Yes       Yes                                    +---------+---------------+---------+-----------+----------+--------------+  PTV       Full                                                             +---------+---------------+---------+-----------+----------+--------------+  PERO      Full                                                             +---------+---------------+---------+-----------+----------+--------------+   +---------+---------------+---------+-----------+----------+--------------+  LEFT      Compressibility Phasicity Spontaneity Properties Thrombus Aging  +---------+---------------+---------+-----------+----------+--------------+  CFV       Full            Yes       Yes                                    +---------+---------------+---------+-----------+----------+--------------+  SFJ       Full                                                              +---------+---------------+---------+-----------+----------+--------------+  FV Prox   Full                                                             +---------+---------------+---------+-----------+----------+--------------+  FV Mid    Full                                                             +---------+---------------+---------+-----------+----------+--------------+  FV Distal Full                                                             +---------+---------------+---------+-----------+----------+--------------+  PFV       Full                                                             +---------+---------------+---------+-----------+----------+--------------+  POP       Full            Yes       Yes                                    +---------+---------------+---------+-----------+----------+--------------+  PTV       Full                                                             +---------+---------------+---------+-----------+----------+--------------+  PERO      Full                                                             +---------+---------------+---------+-----------+----------+--------------+     Summary: BILATERAL: - No evidence of deep vein thrombosis seen in the lower extremities, bilaterally.   *See table(s) above for measurements and observations.    Preliminary     Medications:    amLODipine  10 mg Oral QHS   Chlorhexidine Gluconate Cloth  6 each Topical Q0600   doxercalciferol  6 mcg Intravenous Q M,W,F-HD   metoprolol tartrate  50 mg Oral BID   multivitamin  1 tablet Oral Once per day on Mon Wed Fri   pantoprazole  40 mg Oral BID   Continuous Infusions:  heparin 950 Units/hr (08/11/19 0629)     LOS: 0 days   Geradine Girt  [facility]   How to contact the [facility] Attending or Consulting provider [place] or covering provider during after hours [place], for this patient?  1. Check the care team in [facility] and look for a)  attending/consulting [facility] provider listed and b) the [facility] team listed 2. Log into [email or web address] and use [place]'s universal password to access. If you do not have the password, please contact the hospital operator. 3. Locate the [facility] provider you are looking for under [facility] and page to a number that you can be directly reached. 4. If you still have difficulty reaching the provider, please page the [facility] (Director on Call) for the Hospitalists listed on amion for assistance.  08/11/2019, 10:31 AM

## 2019-08-11 NOTE — Progress Notes (Signed)
Admit: 08/10/2019 LOS: 0  15F ESRD MWF with cholangiocarcinoma, admit with acute PE  Subjective:  . HD yesterday, 2L UF, uneventful; post weight 57.5kg . No c/o this AM, dtr at bedside  03/24 0701 - 03/25 0700 In: -  Out: 2000   Filed Weights   08/10/19 0645 08/11/19 0128 08/11/19 0512  Weight: 60.8 kg 59.4 kg 57.5 kg    Scheduled Meds: . amLODipine  10 mg Oral QHS  . Chlorhexidine Gluconate Cloth  6 each Topical Q0600  . doxercalciferol  6 mcg Intravenous Q M,W,F-HD  . metoprolol tartrate  50 mg Oral BID  . multivitamin  1 tablet Oral Once per day on Mon Wed Fri  . pantoprazole  40 mg Oral BID   Continuous Infusions: . heparin 950 Units/hr (08/11/19 0629)   PRN Meds:.acetaminophen **OR** acetaminophen, albuterol, feeding supplement (NEPRO CARB STEADY), gabapentin, ondansetron **OR** ondansetron (ZOFRAN) IV, oxyCODONE, polyethylene glycol, senna-docusate  Current Labs: reviewed    Physical Exam:  Blood pressure 132/75, pulse 82, temperature 98.8 F (37.1 C), temperature source Oral, resp. rate 17, height 5\' 6"  (1.676 m), weight 57.5 kg, SpO2 100 %. NAD B/l exp wheezing, nl wob Regular, nl s1s2 No LEE AVF LUE AAO x3  Dialysis Orders:  MWF HP 3.75 hr 400/800 2K 2.5 Ca hectorol 6 heparin 4000 with 2000 mid tmt EDW 60 - getting below consistently 58.5 -59.5 Hectorol 6 Mircera 225 q 2 weeks - last 3/22  A 1. ESRD MWF on schedule 2. Acute PE, IV heparin, in setting of #3, per primary / oncology 3. Progressive cholangiocarcinoma, recent new therapy, per oncology, ominous 4. HTN/Vol: will probe down post HD weights, BP stable 5. 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 Anemia 7. Malnutrition  P . HD on schedule: 3.5h, start 3K, 2L UF BP permitting, no heparing on systemic heparin.   . Trend Hb . Medication Issues; o Preferred narcotic agents for pain control are hydromorphone, fentanyl, and methadone. Morphine should not  be used.  o Baclofen should be avoided o Avoid oral sodium phosphate and magnesium citrate based laxatives / bowel preps    Pearson Grippe MD 08/11/2019, 9:32 AM  Recent Labs  Lab 08/10/19 0736 08/11/19 0219 08/11/19 0658  NA 133* 135 137  K 3.0* 3.5 3.5  CL 93* 97* 97*  CO2 28 27 30   GLUCOSE 105* 88 115*  BUN 20 17 7*  CREATININE 5.92* 5.40* 3.18*  CALCIUM 7.7* 7.5* 7.6*  PHOS  --  2.0* 1.2*   Recent Labs  Lab 08/10/19 0736 08/11/19 0218  WBC 8.3 5.5  NEUTROABS 6.6  --   HGB 7.8* 7.7*  HCT 26.6* 26.6*  MCV 83.6 83.4  PLT 331 322

## 2019-08-12 ENCOUNTER — Observation Stay (HOSPITAL_COMMUNITY): Payer: Medicare Other

## 2019-08-12 DIAGNOSIS — I2699 Other pulmonary embolism without acute cor pulmonale: Secondary | ICD-10-CM | POA: Diagnosis not present

## 2019-08-12 LAB — CBC
HCT: 25.2 % — ABNORMAL LOW (ref 36.0–46.0)
Hemoglobin: 7.3 g/dL — ABNORMAL LOW (ref 12.0–15.0)
MCH: 24.4 pg — ABNORMAL LOW (ref 26.0–34.0)
MCHC: 29 g/dL — ABNORMAL LOW (ref 30.0–36.0)
MCV: 84.3 fL (ref 80.0–100.0)
Platelets: 339 10*3/uL (ref 150–400)
RBC: 2.99 MIL/uL — ABNORMAL LOW (ref 3.87–5.11)
RDW: 20.8 % — ABNORMAL HIGH (ref 11.5–15.5)
WBC: 7.4 10*3/uL (ref 4.0–10.5)
nRBC: 0 % (ref 0.0–0.2)

## 2019-08-12 LAB — BLOOD GAS, ARTERIAL
Acid-Base Excess: 6 mmol/L — ABNORMAL HIGH (ref 0.0–2.0)
Bicarbonate: 30.9 mmol/L — ABNORMAL HIGH (ref 20.0–28.0)
FIO2: 28
O2 Saturation: 96.6 %
Patient temperature: 37
pCO2 arterial: 52.3 mmHg — ABNORMAL HIGH (ref 32.0–48.0)
pH, Arterial: 7.389 (ref 7.350–7.450)
pO2, Arterial: 90.5 mmHg (ref 83.0–108.0)

## 2019-08-12 LAB — BASIC METABOLIC PANEL
Anion gap: 10 (ref 5–15)
BUN: 18 mg/dL (ref 8–23)
CO2: 29 mmol/L (ref 22–32)
Calcium: 7.5 mg/dL — ABNORMAL LOW (ref 8.9–10.3)
Chloride: 98 mmol/L (ref 98–111)
Creatinine, Ser: 5.11 mg/dL — ABNORMAL HIGH (ref 0.44–1.00)
GFR calc Af Amer: 9 mL/min — ABNORMAL LOW (ref >60)
GFR calc non Af Amer: 8 mL/min — ABNORMAL LOW (ref >60)
Glucose, Bld: 116 mg/dL — ABNORMAL HIGH (ref 70–99)
Potassium: 3.7 mmol/L (ref 3.5–5.1)
Sodium: 137 mmol/L (ref 135–145)

## 2019-08-12 LAB — GLUCOSE, CAPILLARY: Glucose-Capillary: 99 mg/dL (ref 70–99)

## 2019-08-12 LAB — PROTIME-INR
INR: 1.2 (ref 0.8–1.2)
Prothrombin Time: 14.8 seconds (ref 11.4–15.2)

## 2019-08-12 LAB — HEPARIN LEVEL (UNFRACTIONATED): Heparin Unfractionated: <0.1 IU/mL — ABNORMAL LOW (ref 0.30–0.70)

## 2019-08-12 MED ORDER — APIXABAN 2.5 MG PO TABS: 2.5000 mg | ORAL_TABLET | Freq: Two times a day (BID) | ORAL | 0 refills | Status: DC

## 2019-08-12 MED ORDER — APIXABAN 2.5 MG PO TABS
2.5000 mg | ORAL_TABLET | Freq: Two times a day (BID) | ORAL | Status: DC
  Administered 2019-08-12 – 2019-08-13 (×3): 2.5 mg via ORAL
  Filled 2019-08-12 (×3): qty 1

## 2019-08-12 MED ORDER — DRONABINOL 2.5 MG PO CAPS: 2.5000 mg | ORAL_CAPSULE | Freq: Every day | ORAL | 0 refills | Status: AC

## 2019-08-12 MED ORDER — INSULIN ASPART 100 UNIT/ML ~~LOC~~ SOLN: 8.0000 [IU] | Freq: Once | SUBCUTANEOUS | Status: DC

## 2019-08-12 MED ORDER — DRONABINOL 2.5 MG PO CAPS
2.5000 mg | ORAL_CAPSULE | Freq: Every day | ORAL | Status: DC
  Administered 2019-08-13: 2.5 mg via ORAL
  Filled 2019-08-12: qty 1

## 2019-08-12 MED FILL — ELIQUIS 2.5 MG TABLET: 2.5 | 30 days supply | Qty: 60 | Fill #0

## 2019-08-12 NOTE — Progress Notes (Signed)
Attempted to discharge patient but nurse reported a fever.  Possibly from clot but Will get chest X-ray and u/a.  Nurse also reported a change in mental status from this am at 8.  Will get abg.   Janett Billow Marisha Renier

## 2019-08-12 NOTE — Plan of Care (Signed)
  Problem: Education: Goal: Knowledge of General Education information will improve Description: Including pain rating scale, medication(s)/side effects and non-pharmacologic comfort measures 08/12/2019 1813 by Gwinda Passe, LPN Outcome: Adequate for Discharge 08/12/2019 1813 by Gwinda Passe, LPN Outcome: Adequate for Discharge   Problem: Health Behavior/Discharge Planning: Goal: Ability to manage health-related needs will improve 08/12/2019 1813 by Gwinda Passe, LPN Outcome: Adequate for Discharge 08/12/2019 1813 by Gwinda Passe, LPN Outcome: Adequate for Discharge   Problem: Clinical Measurements: Goal: Ability to maintain clinical measurements within normal limits will improve 08/12/2019 1813 by Gwinda Passe, LPN Outcome: Adequate for Discharge 08/12/2019 1813 by Gwinda Passe, LPN Outcome: Adequate for Discharge Goal: Will remain free from infection 08/12/2019 1813 by Gwinda Passe, LPN Outcome: Adequate for Discharge 08/12/2019 1813 by Gwinda Passe, LPN Outcome: Adequate for Discharge Goal: Diagnostic test results will improve 08/12/2019 1813 by Gwinda Passe, LPN Outcome: Adequate for Discharge 08/12/2019 1813 by Gwinda Passe, LPN Outcome: Adequate for Discharge Goal: Respiratory complications will improve 08/12/2019 1813 by Gwinda Passe, LPN Outcome: Adequate for Discharge 08/12/2019 1813 by Gwinda Passe, LPN Outcome: Adequate for Discharge Goal: Cardiovascular complication will be avoided 08/12/2019 1813 by Gwinda Passe, LPN Outcome: Adequate for Discharge 08/12/2019 1813 by Gwinda Passe, LPN Outcome: Adequate for Discharge   Problem: Activity: Goal: Risk for activity intolerance will decrease 08/12/2019 1813 by Gwinda Passe, LPN Outcome: Adequate for Discharge 08/12/2019 1813 by Gwinda Passe, LPN Outcome: Adequate for Discharge   Problem: Nutrition: Goal: Adequate nutrition will be maintained 08/12/2019 1813 by Gwinda Passe,  LPN Outcome: Adequate for Discharge 08/12/2019 1813 by Gwinda Passe, LPN Outcome: Adequate for Discharge   Problem: Coping: Goal: Level of anxiety will decrease 08/12/2019 1813 by Gwinda Passe, LPN Outcome: Adequate for Discharge 08/12/2019 1813 by Gwinda Passe, LPN Outcome: Adequate for Discharge   Problem: Elimination: Goal: Will not experience complications related to bowel motility 08/12/2019 1813 by Gwinda Passe, LPN Outcome: Adequate for Discharge 08/12/2019 1813 by Gwinda Passe, LPN Outcome: Adequate for Discharge Goal: Will not experience complications related to urinary retention 08/12/2019 1813 by Gwinda Passe, LPN Outcome: Adequate for Discharge 08/12/2019 1813 by Gwinda Passe, LPN Outcome: Adequate for Discharge   Problem: Pain Managment: Goal: General experience of comfort will improve 08/12/2019 1813 by Gwinda Passe, LPN Outcome: Adequate for Discharge 08/12/2019 1813 by Gwinda Passe, LPN Outcome: Adequate for Discharge   Problem: Safety: Goal: Ability to remain free from injury will improve 08/12/2019 1813 by Gwinda Passe, LPN Outcome: Adequate for Discharge 08/12/2019 1813 by Gwinda Passe, LPN Outcome: Adequate for Discharge   Problem: Skin Integrity: Goal: Risk for impaired skin integrity will decrease 08/12/2019 1813 by Gwinda Passe, LPN Outcome: Adequate for Discharge 08/12/2019 1813 by Gwinda Passe, LPN Outcome: Adequate for Discharge

## 2019-08-12 NOTE — Progress Notes (Signed)
Progress Note    Mary Chapman  DEY:[id number] DOB: 12-15-1945  DOA: 08/10/2019 PCP: Default, Provider, MD    Brief Narrative:    Medical records reviewed and are as summarized below:  Mary Chapman is an 74 y.o. female with medical history significant of HTN, intrahepatic cholangiocarcinoma, and ESRD on HD(M/W/F) presents with complaints of shortness of breath over the last 3 days.  Patient was found to have pulmonary emboli on CT angiogram of the chest.  No right heart strain was noted.  Duplex of lower extremities were negative for DVT.  Echo pending.  Patient has been started on heparin drip  Assessment/Plan:   Principal Problem:   Pulmonary embolism (HCC) Active Problems:   Essential hypertension   Elevated troponin   ESRD on dialysis (HCC)   Intrahepatic cholangiocarcinoma (HCC)   GERD (gastroesophageal reflux disease)   Hypokalemia   Anemia due to end stage renal disease (HCC)   Acute respiratory failure (HCC)   Acute respiratory failure with hypoxemia -will need O2 at home -O2 sats were documented around 87% on RA -Due to pulmonary emboli  Acute pulmonary embolism -Heparin drip transition to p.o. Eliquis after discussion with Dr. Morey Hummingbird -Lower extremity Dopplers were negative for DVT -Echo  Elevated troponin -Troponin flat -Suspect secondary to demand with acute pulmonary emboli  Anemia of chronic disease -Hemoglobin was 7.8, prior was 10 in February of this year -Patient given ESA on Monday -Occult stool negative  End-stage renal disease on hemodialysis -Monday Wednesday Friday -Nephrology consulted with hemodialysis last p.m.  Intrahepatic cholangiocarcinoma:  -Patient followed in outpatient setting by Dr. Burr Medico.  Medications treatment included onolaparib, but patient had been off this medication for least 1 week.  Appears this medication can also put patient at risk for pulmonary embolus.   -stop onolaparib   Late entry: Plan was to discharge patient  but around 6:30 PM nurse reported a fever varying from 101.7-100.6.  Patient asymptomatic chest x-ray and urine ordered.  Nurse then also reported altered mental status but then reported patient was not altered.  Due to these late developments will monitor patient overnight again  Family Communication/Anticipated D/C date and plan/Code Status   DVT prophylaxis: Heparin drip on phone: 2 Eliquis Code Status: Full Code.  Family Communication: Daughter at bedside Disposition Plan: Pending oncology consultation and recommendations of anticoagulation   Medical Consultants:    Nephrology  Oncology   Subjective:   No current complaints other than being tired Objective:    Vitals:   08/12/19 1802 08/12/19 1820 08/12/19 1905 08/12/19 2059  BP: 116/71  120/64 (!) 108/55  Pulse: 82  85 74  Resp: 20  18 14   Temp: (!) 101.7 F (38.7 C) (!) 100.6 F (38.1 C) 100.1 F (37.8 C) 97.8 F (36.6 C)  TempSrc: Oral Oral Oral   SpO2: 100%  99% 100%  Weight:      Height:        Intake/Output Summary (Last 24 hours) at 08/13/2019 0731 Last data filed at 08/12/2019 1230 Gross per 24 hour  Intake --  Output 2000 ml  Net -2000 ml   Filed Weights   08/11/19 0512 08/12/19 0840 08/12/19 1230  Weight: 57.5 kg 52.6 kg 50.8 kg    Exam: In bed, no acute distress Wet sounding cough but able to produce sputum Regular rate and rhythm No lower extremity edema is Pleasant cooperative   Data Reviewed:   I have personally reviewed following labs and imaging studies:  Labs: Labs  show the following:   Basic Metabolic Panel: Recent Labs  Lab 08/10/19 0736 08/10/19 0736 08/11/19 0219 08/11/19 0219 08/11/19 0658 08/12/19 0350  NA 133*  --  135  --  137 137  K 3.0*   < > 3.5   < > 3.5 3.7  CL 93*  --  97*  --  97* 98  CO2 28  --  27  --  30 29  GLUCOSE 105*  --  88  --  115* 116*  BUN 20  --  17  --  7* 18  CREATININE 5.92*  --  5.40*  --  3.18* 5.11*  CALCIUM 7.7*  --  7.5*  --  7.6*  7.5*  PHOS  --   --  2.0*  --  1.2*  --    < > = values in this interval not displayed.   GFR Estimated Creatinine Clearance: 7.9 mL/min (A) (by C-G formula based on SCr of 5.11 mg/dL (H)). Liver Function Tests: Recent Labs  Lab 08/10/19 0736 08/11/19 0219 08/11/19 0658  AST 17  --   --   ALT 8  --   --   ALKPHOS 122  --   --   BILITOT 0.7  --   --   PROT 6.7  --   --   ALBUMIN 2.3* 2.0* 2.1*   No results for input(s): LIPASE, AMYLASE in the last 168 hours. No results for input(s): AMMONIA in the last 168 hours. Coagulation profile Recent Labs  Lab 08/12/19 0350  INR 1.2    CBC: Recent Labs  Lab 08/10/19 0736 08/11/19 0218 08/12/19 0350 08/13/19 0336  WBC 8.3 5.5 7.4 8.2  NEUTROABS 6.6  --   --   --   HGB 7.8* 7.7* 7.3* 7.4*  HCT 26.6* 26.6* 25.2* 26.4*  MCV 83.6 83.4 84.3 86.0  PLT 331 322 339 348   Cardiac Enzymes: No results for input(s): CKTOTAL, CKMB, CKMBINDEX, TROPONINI in the last 168 hours. BNP (last 3 results) No results for input(s): PROBNP in the last 8760 hours. CBG: Recent Labs  Lab 08/12/19 1852  GLUCAP 99   D-Dimer: Recent Labs    08/10/19 0736  DDIMER >20.00*   Hgb A1c: No results for input(s): HGBA1C in the last 72 hours. Lipid Profile: No results for input(s): CHOL, HDL, LDLCALC, TRIG, CHOLHDL, LDLDIRECT in the last 72 hours. Thyroid function studies: No results for input(s): TSH, T4TOTAL, T3FREE, THYROIDAB in the last 72 hours.  Invalid input(s): FREET3 Anemia work up: No results for input(s): VITAMINB12, FOLATE, FERRITIN, TIBC, IRON, RETICCTPCT in the last 72 hours. Sepsis Labs: Recent Labs  Lab 08/10/19 0736 08/11/19 0218 08/12/19 0350 08/13/19 0336  WBC 8.3 5.5 7.4 8.2    Microbiology Recent Results (from the past 240 hour(s))  Respiratory Panel by RT PCR (Flu A&B, Covid) - Nasopharyngeal Swab     Status: None   Collection Time: 08/10/19 11:02 AM   Specimen: Nasopharyngeal Swab  Result Value Ref Range Status    SARS Coronavirus 2 by RT PCR NEGATIVE NEGATIVE Final    Comment: (NOTE) SARS-CoV-2 target nucleic acids are NOT DETECTED. The SARS-CoV-2 RNA is generally detectable in upper respiratoy specimens during the acute phase of infection. The lowest concentration of SARS-CoV-2 viral copies this assay can detect is 131 copies/mL. A negative result does not preclude SARS-Cov-2 infection and should not be used as the sole basis for treatment or other patient management decisions. A negative result may occur with  improper  specimen collection/handling, submission of specimen other than nasopharyngeal swab, presence of viral mutation(s) within the areas targeted by this assay, and inadequate number of viral copies (<131 copies/mL). A negative result must be combined with clinical observations, patient history, and epidemiological information. The expected result is Negative. Fact Sheet for Patients:  PinkCheek.be Fact Sheet for Healthcare Providers:  GravelBags.it This test is not yet ap proved or cleared by the [place] FDA and  has been authorized for detection and/or diagnosis of SARS-CoV-2 by FDA under an Emergency Use Authorization (EUA). This EUA will remain  in effect (meaning this test can be used) for the duration of the COVID-19 declaration under Section 564(b)(1) of the Act, 21 U.S.C. section 360bbb-3(b)(1), unless the authorization is terminated or revoked sooner.    Influenza A by PCR NEGATIVE NEGATIVE Final   Influenza B by PCR NEGATIVE NEGATIVE Final    Comment: (NOTE) The Xpert Xpress SARS-CoV-2/FLU/RSV assay is intended as an aid in  the diagnosis of influenza from Nasopharyngeal swab specimens and  should not be used as a sole basis for treatment. Nasal washings and  aspirates are unacceptable for Xpert Xpress SARS-CoV-2/FLU/RSV  testing. Fact Sheet for Patients: PinkCheek.be Fact  Sheet for Healthcare Providers: GravelBags.it This test is not yet approved or cleared by the [place] FDA and  has been authorized for detection and/or diagnosis of SARS-CoV-2 by  FDA under an Emergency Use Authorization (EUA). This EUA will remain  in effect (meaning this test can be used) for the duration of the  Covid-19 declaration under Section 564(b)(1) of the Act, 21  U.S.C. section 360bbb-3(b)(1), unless the authorization is  terminated or revoked. Performed at [facility], [address], [place], [place] [address]     Procedures and diagnostic studies:  DG Chest [facility] 1 View  Result Date: 08/12/2019 CLINICAL DATA:  Fever EXAM: PORTABLE CHEST 1 VIEW COMPARISON:  08/10/2019 chest radiograph. FINDINGS: Right internal jugular Port-A-Cath terminates in the lower third of the SVC. Stable cardiomediastinal silhouette with top-normal heart size. No pneumothorax. No pleural effusion. Mild curvilinear bibasilar lung opacities. No pulmonary edema. IMPRESSION: Mild curvilinear bibasilar lung opacities, favor scarring or atelectasis. Electronically Signed   By: Ilona Sorrel M.D.   On: 08/12/2019 20:06   ECHOCARDIOGRAM COMPLETE  Result Date: 08/11/2019    ECHOCARDIOGRAM REPORT   Patient Name:   Mary Chapman Date of Exam: 08/11/2019 Medical Rec #:  [id number]   Height:       66.0 in Accession #:    [id number]  Weight:       126.8 lb Date of Birth:  November 24, 1945  BSA:          1.647 m Patient Age:    80 years    BP:           137/71 mmHg Patient Gender: F           HR:           83 bpm. Exam Location:  Inpatient Procedure: 2D Echo, 3D Echo, Color Doppler, Cardiac Doppler and Strain Analysis Indications:    I26.02 Pulmonary embolus  History:        Patient has prior history of Echocardiogram examinations, most                 recent 08/08/2016. Risk Factors:Hypertension. Liver Cancer.  Sonographer:    Raquel Sarna Senior RDCS Referring Phys: [id number] [place]  1. Normal LV systolic function; mild LVH; grade 1  diastolic dysfunction; moderate LAE.  2. Left ventricular ejection fraction, by estimation, is 50 to 55%. The left ventricle has low normal function. The left ventricle has no regional wall motion abnormalities. There is mild left ventricular hypertrophy. Left ventricular diastolic parameters are consistent with Grade I diastolic dysfunction (impaired relaxation).  3. Right ventricular systolic function is normal. The right ventricular size is normal.  4. Left atrial size was moderately dilated.  5. The mitral valve is normal in structure. Trivial mitral valve regurgitation. No evidence of mitral stenosis.  6. The aortic valve is normal in structure. Aortic valve regurgitation is not visualized. No aortic stenosis is present.  7. The inferior vena cava is normal in size with greater than 50% respiratory variability, suggesting right atrial pressure of 3 mmHg. FINDINGS  Left Ventricle: Left ventricular ejection fraction, by estimation, is 50 to 55%. The left ventricle has low normal function. The left ventricle has no regional wall motion abnormalities. The left ventricular internal cavity size was normal in size. There is mild left ventricular hypertrophy. Left ventricular diastolic parameters are consistent with Grade I diastolic dysfunction (impaired relaxation). Right Ventricle: The right ventricular size is normal.Right ventricular systolic function is normal. Left Atrium: Left atrial size was moderately dilated. Right Atrium: Right atrial size was normal in size. Pericardium: Trivial pericardial effusion is present. Mitral Valve: The mitral valve is normal in structure. Normal mobility of the mitral valve leaflets. Trivial mitral valve regurgitation. No evidence of mitral valve stenosis. Tricuspid Valve: The tricuspid valve is normal in structure. Tricuspid valve regurgitation is trivial. No evidence of tricuspid stenosis. Aortic Valve: The aortic  valve is normal in structure. Aortic valve regurgitation is not visualized. No aortic stenosis is present. Pulmonic Valve: The pulmonic valve was normal in structure. Pulmonic valve regurgitation is not visualized. No evidence of pulmonic stenosis. Aorta: The aortic root is normal in size and structure. Venous: The inferior vena cava is normal in size with greater than 50% respiratory variability, suggesting right atrial pressure of 3 mmHg. IAS/Shunts: No atrial level shunt detected by color flow Doppler. Additional Comments: Normal LV systolic function; mild LVH; grade 1 diastolic dysfunction; moderate LAE.  LEFT VENTRICLE PLAX 2D LVIDd:         4.70 cm      Diastology LVIDs:         3.70 cm      LV e' lateral:   5.77 cm/s LV PW:         1.30 cm      LV E/e' lateral: 8.9 LV IVS:        0.90 cm      LV e' medial:    5.98 cm/s LVOT diam:     1.90 cm      LV E/e' medial:  8.6 LV SV:         51 LV SV Index:   31 LVOT Area:     2.84 cm  LV Volumes (MOD) LV vol d, MOD A2C: 95.8 ml LV vol d, MOD A4C: 118.0 ml LV vol s, MOD A2C: 52.3 ml LV vol s, MOD A4C: 65.3 ml LV SV MOD A2C:     43.5 ml LV SV MOD A4C:     118.0 ml LV SV MOD BP:      46.1 ml RIGHT VENTRICLE RV S prime:     11.90 cm/s TAPSE (M-mode): 2.3 cm LEFT ATRIUM             Index  RIGHT ATRIUM           Index LA diam:        2.70 cm 1.64 cm/m  RA Area:     19.00 cm LA Vol (A2C):   81.1 ml 49.23 ml/m RA Volume:   53.30 ml  32.35 ml/m LA Vol (A4C):   65.0 ml 39.45 ml/m LA Biplane Vol: 72.9 ml 44.25 ml/m  AORTIC VALVE LVOT Vmax:   101.00 cm/s LVOT Vmean:  70.700 cm/s LVOT VTI:    0.180 m  AORTA Ao Root diam: 2.70 cm Ao Asc diam:  3.20 cm MITRAL VALVE MV Area (PHT): 2.56 cm    SHUNTS MV Decel Time: 296 msec    Systemic VTI:  0.18 m MV E velocity: 51.40 cm/s  Systemic Diam: 1.90 cm MV A velocity: 92.00 cm/s MV E/A ratio:  0.56 Kirk Ruths MD Electronically signed by Kirk Ruths MD Signature Date/Time: 08/11/2019/11:46:37 AM    Final    VAS [place] LOWER  EXTREMITY VENOUS (DVT)  Result Date: 08/11/2019  Lower Venous DVTStudy Indications: Pulmonary embolism.  Comparison Study: no prior Performing Technologist: Abram Sander RVS  Examination Guidelines: A complete evaluation includes B-mode imaging, spectral Doppler, color Doppler, and power Doppler as needed of all accessible portions of each vessel. Bilateral testing is considered an integral part of a complete examination. Limited examinations for reoccurring indications may be performed as noted. The reflux portion of the exam is performed with the patient in reverse Trendelenburg.  +---------+---------------+---------+-----------+----------+--------------+ RIGHT    CompressibilityPhasicitySpontaneityPropertiesThrombus Aging +---------+---------------+---------+-----------+----------+--------------+ CFV      Full           Yes      Yes                                 +---------+---------------+---------+-----------+----------+--------------+ SFJ      Full                                                        +---------+---------------+---------+-----------+----------+--------------+ FV Prox  Full                                                        +---------+---------------+---------+-----------+----------+--------------+ FV Mid   Full                                                        +---------+---------------+---------+-----------+----------+--------------+ FV DistalFull                                                        +---------+---------------+---------+-----------+----------+--------------+ PFV      Full                                                        +---------+---------------+---------+-----------+----------+--------------+  POP      Full           Yes      Yes                                 +---------+---------------+---------+-----------+----------+--------------+ PTV      Full                                                         +---------+---------------+---------+-----------+----------+--------------+ PERO     Full                                                        +---------+---------------+---------+-----------+----------+--------------+   +---------+---------------+---------+-----------+----------+--------------+ LEFT     CompressibilityPhasicitySpontaneityPropertiesThrombus Aging +---------+---------------+---------+-----------+----------+--------------+ CFV      Full           Yes      Yes                                 +---------+---------------+---------+-----------+----------+--------------+ SFJ      Full                                                        +---------+---------------+---------+-----------+----------+--------------+ FV Prox  Full                                                        +---------+---------------+---------+-----------+----------+--------------+ FV Mid   Full                                                        +---------+---------------+---------+-----------+----------+--------------+ FV DistalFull                                                        +---------+---------------+---------+-----------+----------+--------------+ PFV      Full                                                        +---------+---------------+---------+-----------+----------+--------------+ POP      Full           Yes      Yes                                 +---------+---------------+---------+-----------+----------+--------------+  PTV      Full                                                        +---------+---------------+---------+-----------+----------+--------------+ PERO     Full                                                        +---------+---------------+---------+-----------+----------+--------------+     Summary: BILATERAL: - No evidence of deep vein thrombosis seen in the lower extremities, bilaterally.   *See  table(s) above for measurements and observations. Electronically signed by Servando Snare MD on 08/11/2019 at 4:42:37 PM.    Final     Medications:   . amLODipine  10 mg Oral QHS  . apixaban  2.5 mg Oral BID  . Chlorhexidine Gluconate Cloth  6 each Topical Q0600  . doxercalciferol  6 mcg Intravenous Q M,W,F-HD  . dronabinol  2.5 mg Oral QAC lunch  . metoprolol tartrate  50 mg Oral BID  . multivitamin  1 tablet Oral Once per day on Mon Wed Fri  . pantoprazole  40 mg Oral BID   Continuous Infusions:    LOS: 0 days   Geradine Girt  [facility]   How to contact the [facility] Attending or Consulting provider [place] or covering provider during after hours [place], for this patient?  1. Check the care team in [facility] and look for a) attending/consulting [facility] provider listed and b) the [facility] team listed 2. Log into [email or web address] and use [place]'s universal password to access. If you do not have the password, please contact the hospital operator. 3. Locate the [facility] provider you are looking for under [facility] and page to a number that you can be directly reached. 4. If you still have difficulty reaching the provider, please page the [facility] (Director on Call) for the Hospitalists listed on amion for assistance.  08/13/2019, 7:31 AM

## 2019-08-12 NOTE — Evaluation (Signed)
Physical Therapy Evaluation Patient Details Name: Mary Chapman MRN: [id number] DOB: Mar 29, 1946 Today's Date: 08/12/2019   History of Present Illness  Pt is a 74 y/o female admitted secondary to increased shortness of breath. Found to have PE and was started on heparin 3/24. Pt also with intrahepatic cholangiocarcinoma. PMH includes HTN and ESRD on HD.   Clinical Impression  Pt admitted secondary to problem above with deficits below. Pt requiring min to min guard A for mobility tasks this session. Oxygen sats decreasing to 87% on RA and required 2L to return to 92%. Reports daughter will be staying with her initially. Will continue to follow acutely to maximize functional mobility independence and safety.     Follow Up Recommendations Home health PT    Equipment Recommendations  None recommended by PT    Recommendations for Other Services       Precautions / Restrictions Precautions Precautions: Fall;Other (comment) Precaution Comments: Watch O2 sats Restrictions Weight Bearing Restrictions: No      Mobility  Bed Mobility Overal bed mobility: Needs Assistance Bed Mobility: Supine to Sit     Supine to sit: Supervision     General bed mobility comments: Supervision for safety.   Transfers Overall transfer level: Needs assistance Equipment used: None Transfers: Sit to/from Stand Sit to Stand: Min assist         General transfer comment: Min A for steadying assist.   Ambulation/Gait Ambulation/Gait assistance: Min guard Gait Distance (Feet): 20 Feet Assistive device: None Gait Pattern/deviations: Step-through pattern;Decreased stride length Gait velocity: Decreased   General Gait Details: Slow, cautious gait. Only wanting to walk to bathroom and back. Min guard A for safety.   Stairs            Wheelchair Mobility    Modified Rankin (Stroke Patients Only)       Balance Overall balance assessment: Mild deficits observed, not formally tested                                            Pertinent Vitals/Pain Pain Assessment: Faces Faces Pain Scale: Hurts little more Pain Location: Reports liver pain  Pain Descriptors / Indicators: Grimacing;Guarding Pain Intervention(s): Limited activity within patient's tolerance;Monitored during session;Repositioned    Home Living Family/patient expects to be discharged to:: Private residence Living Arrangements: Children Available Help at Discharge: Family;Available 24 hours/day Type of Home: House Home Access: Stairs to enter Entrance Stairs-Rails: Right Entrance Stairs-Number of Steps: 3 Home Layout: One level Home Equipment: Cane - single point      Prior Function Level of Independence: Independent               Hand Dominance        Extremity/Trunk Assessment   Upper Extremity Assessment Upper Extremity Assessment: Generalized weakness    Lower Extremity Assessment Lower Extremity Assessment: Generalized weakness    Cervical / Trunk Assessment Cervical / Trunk Assessment: Normal  Communication   Communication: No difficulties  Cognition Arousal/Alertness: Awake/alert Behavior During Therapy: WFL for tasks assessed/performed Overall Cognitive Status: No family/caregiver present to determine baseline cognitive functioning                                 General Comments: Pt with some slowed processing       General Comments General comments (skin integrity, edema,  etc.): Oxygen sats decreasing to 87% on RA. Required 2L to return to 92%.    Exercises     Assessment/Plan    PT Assessment Patient needs continued PT services  PT Problem List Cardiopulmonary status limiting activity;Decreased strength;Decreased balance;Decreased mobility;Decreased activity tolerance       PT Treatment Interventions Gait training;Stair training;Functional mobility training;Therapeutic activities;Therapeutic exercise;Balance training;Patient/family  education    PT Goals (Current goals can be found in the Care Plan section)  Acute Rehab PT Goals Patient Stated Goal: to go home PT Goal Formulation: With patient Time For Goal Achievement: 08/26/19 Potential to Achieve Goals: Good    Frequency Min 3X/week   Barriers to discharge        Co-evaluation               AM-PAC PT "6 Clicks" Mobility  Outcome Measure Help needed turning from your back to your side while in a flat bed without using bedrails?: None Help needed moving from lying on your back to sitting on the side of a flat bed without using bedrails?: None Help needed moving to and from a bed to a chair (including a wheelchair)?: A Little Help needed standing up from a chair using your arms (e.g., wheelchair or bedside chair)?: A Little Help needed to walk in hospital room?: A Little Help needed climbing 3-5 steps with a railing? : A Lot 6 Click Score: 19    End of Session Equipment Utilized During Treatment: Gait belt Activity Tolerance: Patient limited by fatigue Patient left: in chair;with call bell/phone within reach;with chair alarm set Nurse Communication: Mobility status PT Visit Diagnosis: Other abnormalities of gait and mobility (R26.89)    Time: 7262-0355 PT Time Calculation (min) (ACUTE ONLY): 26 min   Charges:   PT Evaluation $PT Eval Moderate Complexity: 1 Mod PT Treatments $Gait Training: 8-22 mins        [name], DPT  Acute Rehabilitation Services  Pager: [phone] Office: [phone]   [name] 08/12/2019, 2:51 PM

## 2019-08-12 NOTE — Progress Notes (Signed)
SATURATION QUALIFICATIONS: (This note is used to comply with regulatory documentation for home oxygen)  Patient Saturations on Room Air at Rest = 95%  Patient Saturations on Room Air while Ambulating = 78%  Patient Saturations on 1 Liters of oxygen while Ambulating = 92%  Please briefly explain why patient needs home oxygen:Patient desatted to the 70's while walking and had to sit down to recover.

## 2019-08-12 NOTE — Procedures (Signed)
I was present at this dialysis session. I have reviewed the session itself and made appropriate changes.   Onc notes reviewed. Having progressive disease and no further chemotherapy to be used.    Can check PT/INR in outpt HD but would need PCP, Onc, or coag clinic to manage.  Hospice very reasonable here. She says wants to start with palliative care and at some point would transition to hospice.    UF goal 2L.  Move to 4K.    Filed Weights   08/10/19 0645 08/11/19 0128 08/11/19 0512  Weight: 60.8 kg 59.4 kg 57.5 kg    Recent Labs  Lab 08/11/19 0658 08/11/19 0658 08/12/19 0350  NA 137   < > 137  K 3.5   < > 3.7  CL 97*   < > 98  CO2 30   < > 29  GLUCOSE 115*   < > 116*  BUN 7*   < > 18  CREATININE 3.18*   < > 5.11*  CALCIUM 7.6*   < > 7.5*  PHOS 1.2*  --   --    < > = values in this interval not displayed.    Recent Labs  Lab 08/10/19 0736 08/11/19 0218 08/12/19 0350  WBC 8.3 5.5 7.4  NEUTROABS 6.6  --   --   HGB 7.8* 7.7* 7.3*  HCT 26.6* 26.6* 25.2*  MCV 83.6 83.4 84.3  PLT 331 322 339    Scheduled Meds: . amLODipine  10 mg Oral QHS  . Chlorhexidine Gluconate Cloth  6 each Topical Q0600  . doxercalciferol  6 mcg Intravenous Q M,W,F-HD  . dronabinol  2.5 mg Oral QAC lunch  . metoprolol tartrate  50 mg Oral BID  . multivitamin  1 tablet Oral Once per day on Mon Wed Fri  . pantoprazole  40 mg Oral BID  . Warfarin - Pharmacist Dosing Inpatient   Does not apply q1800   Continuous Infusions: . heparin 1,100 Units/hr (08/12/19 0506)   PRN Meds:.acetaminophen **OR** acetaminophen, albuterol, feeding supplement (NEPRO CARB STEADY), gabapentin, ondansetron **OR** ondansetron (ZOFRAN) IV, oxyCODONE, polyethylene glycol, senna-docusate   Mary Grippe  MD 08/12/2019, 8:47 AM

## 2019-08-12 NOTE — Progress Notes (Addendum)
HEMATOLOGY-ONCOLOGY PROGRESS NOTE  SUBJECTIVE: Seen during dialysis today.  Denies chest pain. Breathing is stable.  No bleeding reported.  Wants to go home.  She has no specific complaints today.  Oncology History  Intrahepatic cholangiocarcinoma ([place])  02/23/2018 Imaging   CT AP W Contrast 02/23/18  IMPRESSION: 1. Large multi-cystic lesion in the central aspect of the liver adjacent to the gallbladder fossa, with several smaller satellite lesions. These are all new compared to prior CT the chest, abdomen and pelvis 08/07/2016, concerning for intrahepatic abscesses. 2. Extensive mural thickening and inflammatory changes in the region of the cecum. This is nonspecific, and could reflect either right-sided diverticulitis, focal area of colitis, or potentially even underlying neoplasm. 3. Cholelithiasis. Gallbladder is nearly completely contracted without surrounding inflammatory changes to suggest an acute cholecystitis at this time. 4. Left-sided nephrolithiasis measuring up to 8 mm in the upper pole collecting system of left kidney. No ureteral stones or findings of urinary tract obstruction. 5. Aortic atherosclerosis.   02/27/2018 Initial Biopsy   Diagnosis 02/27/18  Liver, needle/core biopsy, Right Hepatic Lobe - ADENOCARCINOMA. SEE NOTE.   02/27/2018 Miscellaneous     03/05/2018 Procedure   Colonoscopy 03/05/18  IMPRESSION - Preparation of the colon was fair. - Non-thrombosed external hemorrhoids found on digital rectal exam. - There was significant looping of the colon. - Severe diverticulosis in the recto-sigmoid colon, in the sigmoid colon, in the descending colon, in the transverse colon, at the hepatic flexure, in the ascending colon and in the cecum. There was no evidence of diverticular bleeding. - A single (solitary) ulcer in the cecum - highly concerning for underlying malignancy. Biopsied. Phlegmonous change is possible, though often in setting of  complicated diverticulosis/diverticulitis ulceration would not be as common. - Erythematous mucosa in the transverse colon, at the hepatic flexure, in the ascending colon and in the cecum. Biopsied. - Normal mucosa in the rectum, in the recto-sigmoid colon, in the sigmoid colon and in the descending colon. Biopsied. - Non-bleeding non-thrombosed external and internal hemorrhoids. -----Negative for malignancy    03/11/2018 Initial Diagnosis   Intrahepatic cholangiocarcinoma ([place])   04/02/2018 Imaging   CT CAP W contrast 04/02/18  IMPRESSION: 1. 4 mm subpleural nodule in the periphery of the right lower lobe. This is nonspecific, but strongly favored to represent a benign subpleural lymph node. Attention on follow-up studies is recommended to exclude the possibility of metastatic disease. 2. Diffuse bronchial wall thickening with moderate centrilobular and mild paraseptal emphysema; imaging findings suggestive of underlying COPD. 3. Aortic atherosclerosis, in addition to left main and left anterior descending coronary artery disease. Assessment for potential risk factor modification, dietary therapy or pharmacologic therapy may be warranted, if clinically indicated. 4. Nonobstructive calculi in the upper pole collecting system of left kidney measuring up to 7 mm.    04/27/2018 Cancer Staging   Staging form: Intrahepatic Bile Duct, AJCC 8th Edition - Clinical: Stage IIIB (cT2, cN1, cM0) - Signed by Truitt Merle, MD on 04/27/2018   05/18/2018 - 02/10/2019 Chemotherapy   Gemcitabine and Cisplatin every 2 weeks starting on 05/18/2018, with 50% dose reduction. D/c after 02/10/19 due to disease progression.     07/19/2018 Imaging   CT CAP W CONTRAST  IMPRESSION: 1. Dominant inferior liver mass is decreased in size. Two subcentimeter clustered low-attenuation lesions in the far inferior right liver lobe, not discretely visualized on the prior noncontrast CT study, can not exclude new  small satellite hepatic metastases. 2. Porta hepatis adenopathy is mildly decreased. 3.  Small right pulmonary nodules are stable. 4. No additional potential new or progressive metastatic disease. 5. Small pericardial effusion, slightly increased. 6. Cholelithiasis. 7. Moderate diffuse colonic diverticulosis. 8. Aortic Atherosclerosis (ICD10-I70.0) and Emphysema (ICD10-J43.9).    11/02/2018 Imaging   CT CAP WO contrast  IMPRESSION: 1. The dominant right hepatic lobe mass and most of the smaller right hepatic lobe lesions are stable. One of the right hepatic lobe lesions appears to have increased in size, formerly about 1.1 by 1.2 cm and currently 1.9 by 1.5 cm. 2. Two small right lower lobe pulmonary nodules are stable in size. 3. A hypodense right thyroid nodule has enlarged, currently 1.9 by 1.2 cm and formerly 1.1 by 0.7 cm. Consider thyroid ultrasound for further evaluation. 4. Other imaging findings of potential clinical significance: Aortic Atherosclerosis (ICD10-I70.0) and Emphysema (ICD10-J43.9). Coronary atherosclerosis. Mild cardiomegaly. Low-density blood pool suggests anemia. Nonobstructive left nephrolithiasis. Colonic diverticulosis.   02/22/2019 Imaging   CT CAP WO Contrast  IMPRESSION: 1. Interval increase in size of 2 now dominant hepatic lesions consistent with disease progression. Assessment of liver parenchyma limited by noncontrast study. 2. Stable right lower lobe pulmonary nodule. 2nd pulmonary nodule seen previously not identified today. 3. Stable 20 mm right thyroid nodule. 4.  Emphysema. (ICD10-J43.9) 5.  Aortic Atherosclerois (ICD10-170.0)   02/24/2019 - 04/21/2019 Chemotherapy   Second-line FOLFOX  with dose reduction starting 02/24/19. D/c due to disease progression in Liver. Last dose 04/21/19.    05/02/2019 Imaging   CT CAP WO Contrast IMPRESSION: 1. Continued mild progression of the dominant central and smaller inferior right liver lesions. 2.  Haziness and nodularity in the right omentum, adjacent to the inferior right liver is stable. 3. Tiny subpleural nodule right lower lobe is stable. 4. Stable 2 cm right thyroid nodule. Continued attention on follow-up recommended. 5.  Emphysema. (ICD10-J43.9) 6.  Aortic Atherosclerois (ICD10-170.0)   06/07/2019 -  Chemotherapy   Oral Lynparza 100mg  BID starting on 06/07/19   08/02/2019 Imaging   CT AP Wo contrast  IMPRESSION: 1. Interval progression of the large ill-defined hypoattenuating lesion in the central liver with progression of inferior right hepatic lesions. 2. Moderate volume ascites with mesenteric/omental edema and with diffuse body wall edema. 3. Stable 5 mm right lower lobe pulmonary nodule. 4. Nonobstructing left renal stones. 5. Small left groin hernia contains only fat. 6. Aortic Atherosclerosis (ICD10-I70.0).        REVIEW OF SYSTEMS:   Constitutional: Denies fevers, chills or abnormal weight loss Eyes: Denies blurriness of vision Ears, nose, mouth, throat, and face: Denies mucositis or sore throat Respiratory: Breathing remains stable. Cardiovascular: Denies palpitation, chest discomfort Gastrointestinal: Denies nausea and vomiting.  Has intermittent mild abdominal discomfort which is controlled with oral pain medication Skin: Denies abnormal skin rashes Lymphatics: Denies new lymphadenopathy or easy bruising Neurological:Denies numbness, tingling or new weaknesses Behavioral/Psych: Mood is stable, no new changes  Extremities: No lower extremity edema All other systems were reviewed with the patient and are negative.  I have reviewed the past medical history, past surgical history, social history and family history with the patient and they are unchanged from previous note.   PHYSICAL EXAMINATION: ECOG PERFORMANCE STATUS: 2 - Symptomatic, <50% confined to bed  Vitals:   08/12/19 1000 08/12/19 1030  BP: 103/69 103/61  Pulse: 79 78  Resp: 18 20   Temp:    SpO2:     Filed Weights   08/11/19 0128 08/11/19 0512 08/12/19 0840  Weight: 59.4 kg 57.5 kg 52.6  kg    Intake/Output from previous day: 03/25 0701 - 03/26 0700 In: 376 [I.V.:376] Out: -   GENERAL:alert, no distress and comfortable LUNGS: clear to auscultation and percussion with normal breathing effort HEART: regular rate & rhythm and no murmurs and no lower extremity edema ABDOMEN:  Positive bowel sounds, mild tenderness over the right upper quadrant, mild distention Musculoskeletal:no cyanosis of digits and no clubbing  NEURO: alert & oriented x 3 with fluent speech, no focal motor/sensory deficits  LABORATORY DATA:  I have reviewed the data as listed CMP Latest Ref Rng & Units 08/12/2019 08/11/2019 08/11/2019  Glucose 70 - 99 mg/dL 116(H) 115(H) 88  BUN 8 - 23 mg/dL 18 7(L) 17  Creatinine 0.44 - 1.00 mg/dL 5.11(H) 3.18(H) 5.40(H)  Sodium 135 - 145 mmol/L 137 137 135  Potassium 3.5 - 5.1 mmol/L 3.7 3.5 3.5  Chloride 98 - 111 mmol/L 98 97(L) 97(L)  CO2 22 - 32 mmol/L 29 30 27   Calcium 8.9 - 10.3 mg/dL 7.5(L) 7.6(L) 7.5(L)  Total Protein 6.5 - 8.1 g/dL - - -  Total Bilirubin 0.3 - 1.2 mg/dL - - -  Alkaline Phos 38 - 126 U/L - - -  AST 15 - 41 U/L - - -  ALT 0 - 44 U/L - - -    Lab Results  Component Value Date   WBC 7.4 08/12/2019   HGB 7.3 (L) 08/12/2019   HCT 25.2 (L) 08/12/2019   MCV 84.3 08/12/2019   PLT 339 08/12/2019   NEUTROABS 6.6 08/10/2019    CT Abdomen Pelvis Wo Contrast  Result Date: 08/02/2019 CLINICAL DATA:  Intrahepatic cholangiocarcinoma diagnosed in 2019. Right-sided abdominal pain with nausea and vomiting. EXAM: CT ABDOMEN AND PELVIS WITHOUT CONTRAST TECHNIQUE: Multidetector CT imaging of the abdomen and pelvis was performed following the standard protocol without IV contrast. COMPARISON:  05/02/2019 FINDINGS: Lower chest: 5 mm right lower lobe subpleural nodule on 16/6 is stable. Hepatobiliary: The large ill-defined hypoattenuating lesion  in the central liver has progressed in the interval measuring 12.3 x 7.9 cm today compared to 8.6 x 5.8 cm previously. Lesion now largely replaces segment IV B. inferior right hepatic lesions are also progressive with 4.4 cm lesion on 38/2 increased from 2.6 cm previously. Gallbladder not visualized and presumed surgically absent. Periportal edema noted. No extrahepatic biliary duct dilatation. Pancreas: No focal mass lesion. No dilatation of the main duct. No intraparenchymal cyst. No peripancreatic edema. Spleen: No splenomegaly. No focal mass lesion. Adrenals/Urinary Tract: No adrenal nodule or mass. Right kidney unremarkable. Nonobstructing stones again noted left kidney. No hydroureter. Bladder decompressed. Stomach/Bowel: Stomach is unremarkable. No gastric wall thickening. No evidence of outlet obstruction. Duodenum is normally positioned as is the ligament of Treitz. No small bowel wall thickening. No small bowel dilatation. The terminal ileum is normal. The appendix is normal. Diverticuli are seen scattered along the entire length of the colon without CT findings of diverticulitis. Vascular/Lymphatic: There is abdominal aortic atherosclerosis without aneurysm. Probable small lymph nodes in the gastrohepatic ligament and hepatoduodenal ligament although assessment markedly limited due to lack of intravenous contrast material. No bulky retroperitoneal lymphadenopathy. No pelvic sidewall lymphadenopathy. Reproductive: The uterus is unremarkable.  There is no adnexal mass. Other: Moderate volume ascites with diffuse mesenteric edema. Musculoskeletal: Small left groin hernia contains only fat. Diffuse body wall edema evident. No worrisome lytic or sclerotic osseous abnormality. IMPRESSION: 1. Interval progression of the large ill-defined hypoattenuating lesion in the central liver with progression of inferior right hepatic  lesions. 2. Moderate volume ascites with mesenteric/omental edema and with diffuse body  wall edema. 3. Stable 5 mm right lower lobe pulmonary nodule. 4. Nonobstructing left renal stones. 5. Small left groin hernia contains only fat. 6. Aortic Atherosclerosis (ICD10-I70.0). Electronically Signed   By: Misty Stanley M.D.   On: 08/02/2019 13:43   CT Angio Chest PE W and/or Wo Contrast  Result Date: 08/10/2019 CLINICAL DATA:  Shortness of breath. Near syncope. Dizziness. Elevated D-dimer. Intrahepatic cholangiocarcinoma. EXAM: CT ANGIOGRAPHY CHEST WITH CONTRAST TECHNIQUE: Multidetector CT imaging of the chest was performed using the standard protocol during bolus administration of intravenous contrast. Multiplanar CT image reconstructions and MIPs were obtained to evaluate the vascular anatomy. CONTRAST:  157mL OMNIPAQUE IOHEXOL 350 MG/ML SOLN COMPARISON:  Portable chest obtained earlier today. Chest, abdomen and pelvis CT dated 05/02/2019. FINDINGS: Cardiovascular: Single small right lower lobe pulmonary arterial filling defect. Stable enlarged heart. A previously demonstrated small pericardial effusion is no longer seen. Mediastinum/Nodes: No enlarged lymph nodes. The previously demonstrated 2 cm right lobe thyroid nodule is not as well visualized, currently measuring approximately 1.7 cm in maximum diameter with a better defined 1.0 cm oval low density component. Lungs/Pleura: Small right posterior diaphragmatic defect with herniated fat at the posteromedial right lung base. Minimal bilateral dependent atelectasis. Mild bilateral upper lobe centrilobular bullous changes. No pleural fluid. Upper Abdomen: Multiple new liver masses of varying sizes. The previously demonstrated larger masses in the inferior right lobe of the liver are not currently included. Pronounced heterogeneity of the spleen compatible with early phase imaging. Small to moderate amount of free peritoneal fluid, increased. The included portion of the right kidney is small. Musculoskeletal: Thoracic and lower cervical spine  degenerative changes. Review of the MIP images confirms the above findings. IMPRESSION: 1. Small right lower lobe pulmonary embolus without right heart strain. 2. Multiple new liver masses compatible with progressive metastatic disease 3. Mild changes of COPD with centrilobular emphysema in both upper lobes. 4. Stable right posterior diaphragmatic hernia containing herniated fat. 5. No interval increase in size of the previously demonstrated right lobe thyroid nodule. Critical Value/emergent results were called by telephone at the time of interpretation on 08/10/2019 at 10:40 am to provider [facility] , who verbally acknowledged these results. Emphysema (ICD10-J43.9). Electronically Signed   By: Claudie Revering M.D.   On: 08/10/2019 10:45   DG Chest Portable 1 View  Result Date: 08/10/2019 CLINICAL DATA:  Dizziness for 2 days.  Shortness of breath EXAM: PORTABLE CHEST 1 VIEW COMPARISON:  04/24/2019 FINDINGS: Mild cardiomegaly with aortic and hilar contours that are stable. Right port with tip in good position. There is no edema, consolidation, effusion, or pneumothorax. IMPRESSION: Stable exam.  No evidence of active disease. Electronically Signed   By: Monte Fantasia M.D.   On: 08/10/2019 07:19   ECHOCARDIOGRAM COMPLETE  Result Date: 08/11/2019    ECHOCARDIOGRAM REPORT   Patient Name:   Mary Chapman Date of Exam: 08/11/2019 Medical Rec #:  [id number]   Height:       66.0 in Accession #:    [id number]  Weight:       126.8 lb Date of Birth:  02-28-1946  BSA:          1.647 m Patient Age:    14 years    BP:           137/71 mmHg Patient Gender: F           HR:  83 bpm. Exam Location:  Inpatient Procedure: 2D Echo, 3D Echo, Color Doppler, Cardiac Doppler and Strain Analysis Indications:    I26.02 Pulmonary embolus  History:        Patient has prior history of Echocardiogram examinations, most                 recent 08/08/2016. Risk Factors:Hypertension. Liver Cancer.  Sonographer:    Raquel Sarna Senior RDCS  Referring Phys: [phone] [place]  1. Normal LV systolic function; mild LVH; grade 1 diastolic dysfunction; moderate LAE.  2. Left ventricular ejection fraction, by estimation, is 50 to 55%. The left ventricle has low normal function. The left ventricle has no regional wall motion abnormalities. There is mild left ventricular hypertrophy. Left ventricular diastolic parameters are consistent with Grade I diastolic dysfunction (impaired relaxation).  3. Right ventricular systolic function is normal. The right ventricular size is normal.  4. Left atrial size was moderately dilated.  5. The mitral valve is normal in structure. Trivial mitral valve regurgitation. No evidence of mitral stenosis.  6. The aortic valve is normal in structure. Aortic valve regurgitation is not visualized. No aortic stenosis is present.  7. The inferior vena cava is normal in size with greater than 50% respiratory variability, suggesting right atrial pressure of 3 mmHg. FINDINGS  Left Ventricle: Left ventricular ejection fraction, by estimation, is 50 to 55%. The left ventricle has low normal function. The left ventricle has no regional wall motion abnormalities. The left ventricular internal cavity size was normal in size. There is mild left ventricular hypertrophy. Left ventricular diastolic parameters are consistent with Grade I diastolic dysfunction (impaired relaxation). Right Ventricle: The right ventricular size is normal.Right ventricular systolic function is normal. Left Atrium: Left atrial size was moderately dilated. Right Atrium: Right atrial size was normal in size. Pericardium: Trivial pericardial effusion is present. Mitral Valve: The mitral valve is normal in structure. Normal mobility of the mitral valve leaflets. Trivial mitral valve regurgitation. No evidence of mitral valve stenosis. Tricuspid Valve: The tricuspid valve is normal in structure. Tricuspid valve regurgitation is trivial. No evidence of  tricuspid stenosis. Aortic Valve: The aortic valve is normal in structure. Aortic valve regurgitation is not visualized. No aortic stenosis is present. Pulmonic Valve: The pulmonic valve was normal in structure. Pulmonic valve regurgitation is not visualized. No evidence of pulmonic stenosis. Aorta: The aortic root is normal in size and structure. Venous: The inferior vena cava is normal in size with greater than 50% respiratory variability, suggesting right atrial pressure of 3 mmHg. IAS/Shunts: No atrial level shunt detected by color flow Doppler. Additional Comments: Normal LV systolic function; mild LVH; grade 1 diastolic dysfunction; moderate LAE.  LEFT VENTRICLE PLAX 2D LVIDd:         4.70 cm      Diastology LVIDs:         3.70 cm      LV e' lateral:   5.77 cm/s LV PW:         1.30 cm      LV E/e' lateral: 8.9 LV IVS:        0.90 cm      LV e' medial:    5.98 cm/s LVOT diam:     1.90 cm      LV E/e' medial:  8.6 LV SV:         51 LV SV Index:   31 LVOT Area:     2.84 cm  LV Volumes (MOD) LV vol  d, MOD A2C: 95.8 ml LV vol d, MOD A4C: 118.0 ml LV vol s, MOD A2C: 52.3 ml LV vol s, MOD A4C: 65.3 ml LV SV MOD A2C:     43.5 ml LV SV MOD A4C:     118.0 ml LV SV MOD BP:      46.1 ml RIGHT VENTRICLE RV S prime:     11.90 cm/s TAPSE (M-mode): 2.3 cm LEFT ATRIUM             Index       RIGHT ATRIUM           Index LA diam:        2.70 cm 1.64 cm/m  RA Area:     19.00 cm LA Vol (A2C):   81.1 ml 49.23 ml/m RA Volume:   53.30 ml  32.35 ml/m LA Vol (A4C):   65.0 ml 39.45 ml/m LA Biplane Vol: 72.9 ml 44.25 ml/m  AORTIC VALVE LVOT Vmax:   101.00 cm/s LVOT Vmean:  70.700 cm/s LVOT VTI:    0.180 m  AORTA Ao Root diam: 2.70 cm Ao Asc diam:  3.20 cm MITRAL VALVE MV Area (PHT): 2.56 cm    SHUNTS MV Decel Time: 296 msec    Systemic VTI:  0.18 m MV E velocity: 51.40 cm/s  Systemic Diam: 1.90 cm MV A velocity: 92.00 cm/s MV E/A ratio:  0.56 Kirk Ruths MD Electronically signed by Kirk Ruths MD Signature Date/Time:  08/11/2019/11:46:37 AM    Final    VAS [place] LOWER EXTREMITY VENOUS (DVT)  Result Date: 08/11/2019  Lower Venous DVTStudy Indications: Pulmonary embolism.  Comparison Study: no prior Performing Technologist: Abram Sander RVS  Examination Guidelines: A complete evaluation includes B-mode imaging, spectral Doppler, color Doppler, and power Doppler as needed of all accessible portions of each vessel. Bilateral testing is considered an integral part of a complete examination. Limited examinations for reoccurring indications may be performed as noted. The reflux portion of the exam is performed with the patient in reverse Trendelenburg.  +---------+---------------+---------+-----------+----------+--------------+ RIGHT    CompressibilityPhasicitySpontaneityPropertiesThrombus Aging +---------+---------------+---------+-----------+----------+--------------+ CFV      Full           Yes      Yes                                 +---------+---------------+---------+-----------+----------+--------------+ SFJ      Full                                                        +---------+---------------+---------+-----------+----------+--------------+ FV Prox  Full                                                        +---------+---------------+---------+-----------+----------+--------------+ FV Mid   Full                                                        +---------+---------------+---------+-----------+----------+--------------+ FV DistalFull                                                        +---------+---------------+---------+-----------+----------+--------------+  PFV      Full                                                        +---------+---------------+---------+-----------+----------+--------------+ POP      Full           Yes      Yes                                 +---------+---------------+---------+-----------+----------+--------------+ PTV      Full                                                         +---------+---------------+---------+-----------+----------+--------------+ PERO     Full                                                        +---------+---------------+---------+-----------+----------+--------------+   +---------+---------------+---------+-----------+----------+--------------+ LEFT     CompressibilityPhasicitySpontaneityPropertiesThrombus Aging +---------+---------------+---------+-----------+----------+--------------+ CFV      Full           Yes      Yes                                 +---------+---------------+---------+-----------+----------+--------------+ SFJ      Full                                                        +---------+---------------+---------+-----------+----------+--------------+ FV Prox  Full                                                        +---------+---------------+---------+-----------+----------+--------------+ FV Mid   Full                                                        +---------+---------------+---------+-----------+----------+--------------+ FV DistalFull                                                        +---------+---------------+---------+-----------+----------+--------------+ PFV      Full                                                        +---------+---------------+---------+-----------+----------+--------------+  POP      Full           Yes      Yes                                 +---------+---------------+---------+-----------+----------+--------------+ PTV      Full                                                        +---------+---------------+---------+-----------+----------+--------------+ PERO     Full                                                        +---------+---------------+---------+-----------+----------+--------------+     Summary: BILATERAL: - No evidence of deep vein thrombosis  seen in the lower extremities, bilaterally.   *See table(s) above for measurements and observations. Electronically signed by Servando Snare MD on 08/11/2019 at 4:42:37 PM.    Final     ASSESSMENT AND PLAN: 1.  Adenocarcinoma in the liver, likely intrahepatic cholangiocarcinoma 2.  Pulmonary embolism 3.  Abdominal pain secondary to #1, controlled 4.  End-stage renal disease 5.  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  Hypertension 7.  Poor appetite/anorexia  -Consideration of embolization can be considered as an outpatient if her performance status improves.  Hospice has been discussed with the patient and her daughter and the patient is concerned that hemodialysis would not be covered under hospice.  She is not yet ready to stop dialysis.  Additional treatments for her cancer are very limited.  We will continue ongoing discussions regarding goals of care. -For her new diagnosis of PE, okay to start her on Eliquis 2.5 mg twice a day. -Hemoglobin is stable today.  No evidence of bleeding.  Transfuse PRBCs for hemoglobin less than 7. -Marinol has been initiated for her poor appetite and anorexia.   LOS: 0 days   Mikey Bussing, DNP, AGPCNP-BC, AOCNP 08/12/19  Addendum  I have seen the patient, examined her. I agree with the assessment and and plan and have edited the notes.   Pt will be discharged home today with Eliquis. My office will arrange palliative home care service. She wants a wheelchair, not sure if our SW is still able to order for her. I will do a virtual visit with her in 1-2 weeks. Questions were answered.   Truitt Merle  08/12/2019

## 2019-08-12 NOTE — Progress Notes (Signed)
ANTICOAGULATION CONSULT NOTE - Follow Up Consult  Pharmacy Consult for Heparin > Warfarin Indication: pulmonary embolus  No Known Allergies  Patient Measurements: Height: 5\' 6"  (167.6 cm) Weight: 126 lb 12.2 oz (57.5 kg) IBW/kg (Calculated) : 59.3 Heparin Dosing Weight: 60 kg   Vital Signs: Temp: 98.3 F (36.8 C) (03/25 2220) Temp Source: Oral (03/25 2220) BP: 128/74 (03/25 2220) Pulse Rate: 90 (03/25 2220)  Labs: Recent Labs     0000 08/10/19 0736 08/10/19 0859 08/10/19 2000 08/11/19 0218 08/11/19 0219 08/11/19 0658 08/11/19 0658 08/11/19 1800 08/11/19 1907 08/12/19 0350  HGB   < > 7.8*  --   --  7.7*  --   --   --   --   --  7.3*  HCT  --  26.6*  --   --  26.6*  --   --   --   --   --  25.2*  PLT  --  331  --   --  322  --   --   --   --   --  339  LABPROT  --   --   --   --   --   --   --   --   --   --  14.8  INR  --   --   --   --   --   --   --   --   --   --  1.2  HEPARINUNFRC  --   --   --    < >  --   --  0.25*   < > >2.20* <0.10* <0.10*  CREATININE  --  5.92*  --   --   --  5.40* 3.18*  --   --   --   --   TROPONINIHS  --  36* 36*  --   --   --   --   --   --   --   --    < > = values in this interval not displayed.    Estimated Creatinine Clearance: 14.3 mL/min (A) (by C-G formula based on SCr of 3.18 mg/dL (H)).   Medical History: Past Medical History:  Diagnosis Date  . Diverticulitis   . Focal segmental glomerulosclerosis    ESRD on MWF HD  . Hypertension   . intrahepatic cholangio ca dx'd10/2019  . Renal insufficiency    dialysis pt MWF  . Tobacco abuse     Assessment: 74 yr old female with acute R lobe PE without right heart strain. Pharmacy consulted to dose heparin for anticoagulation.   Heparin level this morning on heparin infusion at 950 units/hr was slightly SUBtherapeutic (HL 0.25 units/ml, goal of 0.3-0.7 unit/ml). Hgb/Hct low but stable, plts wnl. Per discussion RN - the heparin bag was exchanged right before the lab was drawn  as the patient returned from HD. It is unclear if the bag had run dry or was still infusing. Heparin infusion was increased to 1000 units/hr.  Heparin level ~ 6 hrs after heparin infusion was increased to 1000 units/hr was >2.20 units/ml. Per RN, the heparin level was drawn from the port in which the heparin was infusing (heparin infusion was stopped ~5 mins and line was flushed before the level was drawn). RN was instructed to pause heparin infusion while repeat stat (peripheral stick by lab) heparin level was drawn. Heparin level drawn just after infusion was paused was <0.10 units/ml. It is likely that both heparin  levels are erroneous. Given that pt has PE, will restart heparin infusion at prior rate (1000 units/hr) and check heparin level in 8 hrs (peripheral stick by lab). CBC stable. Per RN, no issues with bleeding observed.  Pharmacy is now consulted to dose warfarin for PE. Baseline albumin 2.1, pt is on no meds known to interact with warfarin.  3/26 AM update:  Heparin level undetectable Hgb in the 7's for the last 3 days  INR 1.2   Goal of Therapy:  Heparin level 0.3-0.7 units/ml Monitor platelets by anticoagulation protocol: Yes   Plan:  Inc heparin to 1100 units/hr Re-check heparin level in 8 hours  [name], PharmD, [place] Pharmacist Phone: [phone]

## 2019-08-13 DIAGNOSIS — I2699 Other pulmonary embolism without acute cor pulmonale: Secondary | ICD-10-CM | POA: Diagnosis not present

## 2019-08-13 DIAGNOSIS — D631 Anemia in chronic kidney disease: Secondary | ICD-10-CM

## 2019-08-13 DIAGNOSIS — N186 End stage renal disease: Secondary | ICD-10-CM | POA: Diagnosis not present

## 2019-08-13 DIAGNOSIS — C221 Intrahepatic bile duct carcinoma: Secondary | ICD-10-CM | POA: Diagnosis not present

## 2019-08-13 LAB — CBC
HCT: 26.4 % — ABNORMAL LOW (ref 36.0–46.0)
Hemoglobin: 7.4 g/dL — ABNORMAL LOW (ref 12.0–15.0)
MCH: 24.1 pg — ABNORMAL LOW (ref 26.0–34.0)
MCHC: 28 g/dL — ABNORMAL LOW (ref 30.0–36.0)
MCV: 86 fL (ref 80.0–100.0)
Platelets: 348 10*3/uL (ref 150–400)
RBC: 3.07 MIL/uL — ABNORMAL LOW (ref 3.87–5.11)
RDW: 21.2 % — ABNORMAL HIGH (ref 11.5–15.5)
WBC: 8.2 10*3/uL (ref 4.0–10.5)
nRBC: 0.2 % (ref 0.0–0.2)

## 2019-08-13 MED ORDER — HEPARIN SOD (PORK) LOCK FLUSH 100 UNIT/ML IV SOLN
500.0000 [IU] | INTRAVENOUS | Status: AC | PRN
  Administered 2019-08-13: 500 [IU]
  Filled 2019-08-13: qty 5

## 2019-08-13 NOTE — TOC Transition Note (Signed)
Transition of Care [facility] [address]) - CM/SW Discharge Note   Patient Details  Name: Mary Chapman MRN: [id number] Date of Birth: 11/19/1945  [facility] CM/SW Contact:  Bartholomew Crews, RN Phone Number: [phone] 08/13/2019, 11:14 AM   Clinical Narrative:    Patient has discharge orders for transition home today. Spoke with patient at the bedside and with her daughter, Margarita Grizzle, on speaker phone. Margarita Grizzle states that they had selected [facility] for [facility] PT. Notified Alvis Lemmings of transition home today. HH PT orders have been placed by MD.   Noted DME orders for oxygen with qualifying note by PT yesterday. Noted oxygen has been delivered to room by [employer] (portable concentrator with back up tank).   Daughter to provide transportation home, and according to patient her daughter will stay with her a couple of days to assist her as needed. No further TOC needs identified at this time.    Final next level of care: [place] Barriers to Discharge: No Barriers Identified   Patient Goals and CMS Choice Patient states their goals for this hospitalization and ongoing recovery are:: return home CMS Medicare.gov Compare Post Acute Care list provided to:: Patient Choice offered to / list presented to : Patient, Adult Children  Discharge Placement                       Discharge Plan and Services                DME Arranged: Oxygen DME Agency: [employer]       HH Arranged: PT [place] Agency: [place] Date [place]: 08/13/19 Time [place]: [id number] Representative spoke with at [place]: [place] ([place]) Interventions     Readmission Risk Interventions No flowsheet data found.

## 2019-08-13 NOTE — Discharge Summary (Signed)
Physician Discharge Summary  Mary Chapman OZD:[id number] DOB: 07/30/1945 DOA: 08/10/2019  PCP: Default, Provider, MD  Admit date: 08/10/2019 Discharge date: 08/13/2019  Admitted From: Home Discharge disposition: Home with daughter   Recommendations for Outpatient Follow-Up:   Home health Suspect patient can be weaned off oxygen as PE has been treated: Daughter to pick up pulse ox  Discharge Diagnosis:   Principal Problem:   Pulmonary embolism ([place]) Active Problems:   Essential hypertension   Elevated troponin   ESRD on dialysis ([place])   Intrahepatic cholangiocarcinoma ([facility])   GERD (gastroesophageal reflux disease)   Hypokalemia   Anemia due to end stage renal disease ([place])   Acute respiratory failure ([place])    Discharge Condition: Improved.  Diet recommendation:.  Regular.  Wound care: None.  Code status: Per Dr. Burr Medico patient is a DNR   History of Present Illness:   Mary Chapman is a 74 y.o. female with medical history significant of HTN, intrahepatic cholangiocarcinoma, and ESRD on HD(M/W/F) presents with complaints of shortness of breath over the last 3 days.  History is obtained from from the patient, but her daughter who is present at bedside provides additional information.  Apparently, over the last week she has noted a progressive decline in the patient.  She has been more lethargic and sleeping more than usual.  Patient reports that her legs felt heavy like wood, she was dizzy, and felt like she was to stumbling about.  Associated symptoms include having productive cough, generalized weakness, decreased appetite, and dry heaves.  Denies having any falls or loss consciousness.  She is followed by Dr. Burr Medico for treatment of her cancer and has been prescribed ononolaparib.  However, patient reports that she ran out of this medicine sometime last week and was waiting for refill.  She had gone to dialysis today, but had not been dialyzed due to her symptoms when she  was sent over to [place].   Hospital Course by Problem:   Acute respiratory failure with hypoxemia -will need O2 at home but suspect she can be weaned off over time -O2 sats were documented around 87% on RA -Due to pulmonary emboli  Acute pulmonary embolism -Heparin drip transition to p.o. Eliquis after discussion with Dr. Morey Hummingbird -Lower extremity Dopplers were negative for DVT -Echo: Normal LV systolic function; mild LVH; grade 1 diastolic dysfunction;  moderate LAE.   Elevated troponin -Troponin flat -Suspect secondary to demand with acute pulmonary emboli  Anemia of chronic disease -Hemoglobin was 7.8, prior was 10 in February of this year -Patient given ESA on Monday -Occult stool negative  End-stage renal disease on hemodialysis -Monday Wednesday Friday  Intrahepatic cholangiocarcinoma:  -Patient followed in outpatient setting by Dr.Feng.Medications treatment included onolaparib, butpatient had been off this medication for least 1 week. Appears this medication canalsoput patient at risk for pulmonary embolus.  -stop onolaparib  Reported fever -No further episodes  Reported altered mental status by nursing staff that resolved very quickly -Suspect patient was very fatigued but did instruct daughter that the new medication for appetite and nausea from the chemo could cause excessive sleepiness so to monitor closely    Medical Consultants:      Discharge Exam:   Vitals:   08/12/19 2059 08/13/19 0813  BP: (!) 108/55 127/71  Pulse: 74 84  Resp: 14 19  Temp: 97.8 F (36.6 C) 98.9 F (37.2 C)  SpO2: 100% 100%   Vitals:   08/12/19 1820 08/12/19 1905 08/12/19  2059 08/13/19 0813  BP:  120/64 (!) 108/55 127/71  Pulse:  85 74 84  Resp:  18 14 19   Temp: (!) 100.6 F (38.1 C) 100.1 F (37.8 C) 97.8 F (36.6 C) 98.9 F (37.2 C)  TempSrc: Oral Oral    SpO2:  99% 100% 100%  Weight:      Height:        General exam: Appears calm and  comfortable.    The results of significant diagnostics from this hospitalization (including imaging, microbiology, ancillary and laboratory) are listed below for reference.     Procedures and Diagnostic Studies:   CT Angio Chest PE W and/or Wo Contrast  Result Date: 08/10/2019 CLINICAL DATA:  Shortness of breath. Near syncope. Dizziness. Elevated D-dimer. Intrahepatic cholangiocarcinoma. EXAM: CT ANGIOGRAPHY CHEST WITH CONTRAST TECHNIQUE: Multidetector CT imaging of the chest was performed using the standard protocol during bolus administration of intravenous contrast. Multiplanar CT image reconstructions and MIPs were obtained to evaluate the vascular anatomy. CONTRAST:  178mL OMNIPAQUE IOHEXOL 350 MG/ML SOLN COMPARISON:  Portable chest obtained earlier today. Chest, abdomen and pelvis CT dated 05/02/2019. FINDINGS: Cardiovascular: Single small right lower lobe pulmonary arterial filling defect. Stable enlarged heart. A previously demonstrated small pericardial effusion is no longer seen. Mediastinum/Nodes: No enlarged lymph nodes. The previously demonstrated 2 cm right lobe thyroid nodule is not as well visualized, currently measuring approximately 1.7 cm in maximum diameter with a better defined 1.0 cm oval low density component. Lungs/Pleura: Small right posterior diaphragmatic defect with herniated fat at the posteromedial right lung base. Minimal bilateral dependent atelectasis. Mild bilateral upper lobe centrilobular bullous changes. No pleural fluid. Upper Abdomen: Multiple new liver masses of varying sizes. The previously demonstrated larger masses in the inferior right lobe of the liver are not currently included. Pronounced heterogeneity of the spleen compatible with early phase imaging. Small to moderate amount of free peritoneal fluid, increased. The included portion of the right kidney is small. Musculoskeletal: Thoracic and lower cervical spine degenerative changes. Review of the MIP images  confirms the above findings. IMPRESSION: 1. Small right lower lobe pulmonary embolus without right heart strain. 2. Multiple new liver masses compatible with progressive metastatic disease 3. Mild changes of COPD with centrilobular emphysema in both upper lobes. 4. Stable right posterior diaphragmatic hernia containing herniated fat. 5. No interval increase in size of the previously demonstrated right lobe thyroid nodule. Critical Value/emergent results were called by telephone at the time of interpretation on 08/10/2019 at 10:40 am to provider [facility] , who verbally acknowledged these results. Emphysema (ICD10-J43.9). Electronically Signed   By: Claudie Revering M.D.   On: 08/10/2019 10:45   DG Chest Portable 1 View  Result Date: 08/10/2019 CLINICAL DATA:  Dizziness for 2 days.  Shortness of breath EXAM: PORTABLE CHEST 1 VIEW COMPARISON:  04/24/2019 FINDINGS: Mild cardiomegaly with aortic and hilar contours that are stable. Right port with tip in good position. There is no edema, consolidation, effusion, or pneumothorax. IMPRESSION: Stable exam.  No evidence of active disease. Electronically Signed   By: Monte Fantasia M.D.   On: 08/10/2019 07:19   ECHOCARDIOGRAM COMPLETE  Result Date: 08/11/2019    ECHOCARDIOGRAM REPORT   Patient Name:   Mary Chapman Date of Exam: 08/11/2019 Medical Rec #:  [id number]   Height:       66.0 in Accession #:    [id number]  Weight:       126.8 lb Date of Birth:  15-Sep-1945  BSA:  1.647 m Patient Age:    74 years    BP:           137/71 mmHg Patient Gender: F           HR:           83 bpm. Exam Location:  Inpatient Procedure: 2D Echo, 3D Echo, Color Doppler, Cardiac Doppler and Strain Analysis Indications:    I26.02 Pulmonary embolus  History:        Patient has prior history of Echocardiogram examinations, most                 recent 08/08/2016. Risk Factors:Hypertension. Liver Cancer.  Sonographer:    Raquel Sarna Senior RDCS Referring Phys: [id number] [place]  1. Normal LV systolic function; mild LVH; grade 1 diastolic dysfunction; moderate LAE.  2. Left ventricular ejection fraction, by estimation, is 50 to 55%. The left ventricle has low normal function. The left ventricle has no regional wall motion abnormalities. There is mild left ventricular hypertrophy. Left ventricular diastolic parameters are consistent with Grade I diastolic dysfunction (impaired relaxation).  3. Right ventricular systolic function is normal. The right ventricular size is normal.  4. Left atrial size was moderately dilated.  5. The mitral valve is normal in structure. Trivial mitral valve regurgitation. No evidence of mitral stenosis.  6. The aortic valve is normal in structure. Aortic valve regurgitation is not visualized. No aortic stenosis is present.  7. The inferior vena cava is normal in size with greater than 50% respiratory variability, suggesting right atrial pressure of 3 mmHg. FINDINGS  Left Ventricle: Left ventricular ejection fraction, by estimation, is 50 to 55%. The left ventricle has low normal function. The left ventricle has no regional wall motion abnormalities. The left ventricular internal cavity size was normal in size. There is mild left ventricular hypertrophy. Left ventricular diastolic parameters are consistent with Grade I diastolic dysfunction (impaired relaxation). Right Ventricle: The right ventricular size is normal.Right ventricular systolic function is normal. Left Atrium: Left atrial size was moderately dilated. Right Atrium: Right atrial size was normal in size. Pericardium: Trivial pericardial effusion is present. Mitral Valve: The mitral valve is normal in structure. Normal mobility of the mitral valve leaflets. Trivial mitral valve regurgitation. No evidence of mitral valve stenosis. Tricuspid Valve: The tricuspid valve is normal in structure. Tricuspid valve regurgitation is trivial. No evidence of tricuspid stenosis. Aortic Valve: The aortic  valve is normal in structure. Aortic valve regurgitation is not visualized. No aortic stenosis is present. Pulmonic Valve: The pulmonic valve was normal in structure. Pulmonic valve regurgitation is not visualized. No evidence of pulmonic stenosis. Aorta: The aortic root is normal in size and structure. Venous: The inferior vena cava is normal in size with greater than 50% respiratory variability, suggesting right atrial pressure of 3 mmHg. IAS/Shunts: No atrial level shunt detected by color flow Doppler. Additional Comments: Normal LV systolic function; mild LVH; grade 1 diastolic dysfunction; moderate LAE.  LEFT VENTRICLE PLAX 2D LVIDd:         4.70 cm      Diastology LVIDs:         3.70 cm      LV e' lateral:   5.77 cm/s LV PW:         1.30 cm      LV E/e' lateral: 8.9 LV IVS:        0.90 cm      LV e' medial:    5.98 cm/s  LVOT diam:     1.90 cm      LV E/e' medial:  8.6 LV SV:         51 LV SV Index:   31 LVOT Area:     2.84 cm  LV Volumes (MOD) LV vol d, MOD A2C: 95.8 ml LV vol d, MOD A4C: 118.0 ml LV vol s, MOD A2C: 52.3 ml LV vol s, MOD A4C: 65.3 ml LV SV MOD A2C:     43.5 ml LV SV MOD A4C:     118.0 ml LV SV MOD BP:      46.1 ml RIGHT VENTRICLE RV S prime:     11.90 cm/s TAPSE (M-mode): 2.3 cm LEFT ATRIUM             Index       RIGHT ATRIUM           Index LA diam:        2.70 cm 1.64 cm/m  RA Area:     19.00 cm LA Vol (A2C):   81.1 ml 49.23 ml/m RA Volume:   53.30 ml  32.35 ml/m LA Vol (A4C):   65.0 ml 39.45 ml/m LA Biplane Vol: 72.9 ml 44.25 ml/m  AORTIC VALVE LVOT Vmax:   101.00 cm/s LVOT Vmean:  70.700 cm/s LVOT VTI:    0.180 m  AORTA Ao Root diam: 2.70 cm Ao Asc diam:  3.20 cm MITRAL VALVE MV Area (PHT): 2.56 cm    SHUNTS MV Decel Time: 296 msec    Systemic VTI:  0.18 m MV E velocity: 51.40 cm/s  Systemic Diam: 1.90 cm MV A velocity: 92.00 cm/s MV E/A ratio:  0.56 Kirk Ruths MD Electronically signed by Kirk Ruths MD Signature Date/Time: 08/11/2019/11:46:37 AM    Final    VAS [place] LOWER  EXTREMITY VENOUS (DVT)  Result Date: 08/11/2019  Lower Venous DVTStudy Indications: Pulmonary embolism.  Comparison Study: no prior Performing Technologist: Abram Sander RVS  Examination Guidelines: A complete evaluation includes B-mode imaging, spectral Doppler, color Doppler, and power Doppler as needed of all accessible portions of each vessel. Bilateral testing is considered an integral part of a complete examination. Limited examinations for reoccurring indications may be performed as noted. The reflux portion of the exam is performed with the patient in reverse Trendelenburg.  +---------+---------------+---------+-----------+----------+--------------+ RIGHT    CompressibilityPhasicitySpontaneityPropertiesThrombus Aging +---------+---------------+---------+-----------+----------+--------------+ CFV      Full           Yes      Yes                                 +---------+---------------+---------+-----------+----------+--------------+ SFJ      Full                                                        +---------+---------------+---------+-----------+----------+--------------+ FV Prox  Full                                                        +---------+---------------+---------+-----------+----------+--------------+ FV Mid   Full                                                        +---------+---------------+---------+-----------+----------+--------------+  FV DistalFull                                                        +---------+---------------+---------+-----------+----------+--------------+ PFV      Full                                                        +---------+---------------+---------+-----------+----------+--------------+ POP      Full           Yes      Yes                                 +---------+---------------+---------+-----------+----------+--------------+ PTV      Full                                                         +---------+---------------+---------+-----------+----------+--------------+ PERO     Full                                                        +---------+---------------+---------+-----------+----------+--------------+   +---------+---------------+---------+-----------+----------+--------------+ LEFT     CompressibilityPhasicitySpontaneityPropertiesThrombus Aging +---------+---------------+---------+-----------+----------+--------------+ CFV      Full           Yes      Yes                                 +---------+---------------+---------+-----------+----------+--------------+ SFJ      Full                                                        +---------+---------------+---------+-----------+----------+--------------+ FV Prox  Full                                                        +---------+---------------+---------+-----------+----------+--------------+ FV Mid   Full                                                        +---------+---------------+---------+-----------+----------+--------------+ FV DistalFull                                                        +---------+---------------+---------+-----------+----------+--------------+  PFV      Full                                                        +---------+---------------+---------+-----------+----------+--------------+ POP      Full           Yes      Yes                                 +---------+---------------+---------+-----------+----------+--------------+ PTV      Full                                                        +---------+---------------+---------+-----------+----------+--------------+ PERO     Full                                                        +---------+---------------+---------+-----------+----------+--------------+     Summary: BILATERAL: - No evidence of deep vein thrombosis seen in the lower extremities, bilaterally.   *See  table(s) above for measurements and observations. Electronically signed by Servando Snare MD on 08/11/2019 at 4:42:37 PM.    Final      Labs:   Basic Metabolic Panel: Recent Labs  Lab 08/10/19 0736 08/10/19 0736 08/11/19 1275 08/11/19 0219 08/11/19 0658 08/12/19 0350  NA 133*  --  135  --  137 137  K 3.0*   < > 3.5   < > 3.5 3.7  CL 93*  --  97*  --  97* 98  CO2 28  --  27  --  30 29  GLUCOSE 105*  --  88  --  115* 116*  BUN 20  --  17  --  7* 18  CREATININE 5.92*  --  5.40*  --  3.18* 5.11*  CALCIUM 7.7*  --  7.5*  --  7.6* 7.5*  PHOS  --   --  2.0*  --  1.2*  --    < > = values in this interval not displayed.   GFR Estimated Creatinine Clearance: 7.9 mL/min (A) (by C-G formula based on SCr of 5.11 mg/dL (H)). Liver Function Tests: Recent Labs  Lab 08/10/19 0736 08/11/19 0219 08/11/19 0658  AST 17  --   --   ALT 8  --   --   ALKPHOS 122  --   --   BILITOT 0.7  --   --   PROT 6.7  --   --   ALBUMIN 2.3* 2.0* 2.1*   No results for input(s): LIPASE, AMYLASE in the last 168 hours. No results for input(s): AMMONIA in the last 168 hours. Coagulation profile Recent Labs  Lab 08/12/19 0350  INR 1.2    CBC: Recent Labs  Lab 08/10/19 0736 08/11/19 0218 08/12/19 0350 08/13/19 0336  WBC 8.3 5.5 7.4 8.2  NEUTROABS 6.6  --   --   --   HGB 7.8* 7.7* 7.3* 7.4*  HCT 26.6*  26.6* 25.2* 26.4*  MCV 83.6 83.4 84.3 86.0  PLT 331 322 339 348   Cardiac Enzymes: No results for input(s): CKTOTAL, CKMB, CKMBINDEX, TROPONINI in the last 168 hours. BNP: Invalid input(s): POCBNP CBG: Recent Labs  Lab 08/12/19 1852  GLUCAP 99   D-Dimer No results for input(s): DDIMER in the last 72 hours. Hgb A1c No results for input(s): HGBA1C in the last 72 hours. Lipid Profile No results for input(s): CHOL, HDL, LDLCALC, TRIG, CHOLHDL, LDLDIRECT in the last 72 hours. Thyroid function studies No results for input(s): TSH, T4TOTAL, T3FREE, THYROIDAB in the last 72 hours.  Invalid  input(s): FREET3 Anemia work up No results for input(s): VITAMINB12, FOLATE, FERRITIN, TIBC, IRON, RETICCTPCT in the last 72 hours. Microbiology Recent Results (from the past 240 hour(s))  Respiratory Panel by RT PCR (Flu A&B, Covid) - Nasopharyngeal Swab     Status: None   Collection Time: 08/10/19 11:02 AM   Specimen: Nasopharyngeal Swab  Result Value Ref Range Status   SARS Coronavirus 2 by RT PCR NEGATIVE NEGATIVE Final    Comment: (NOTE) SARS-CoV-2 target nucleic acids are NOT DETECTED. The SARS-CoV-2 RNA is generally detectable in upper respiratoy specimens during the acute phase of infection. The lowest concentration of SARS-CoV-2 viral copies this assay can detect is 131 copies/mL. A negative result does not preclude SARS-Cov-2 infection and should not be used as the sole basis for treatment or other patient management decisions. A negative result may occur with  improper specimen collection/handling, submission of specimen other than nasopharyngeal swab, presence of viral mutation(s) within the areas targeted by this assay, and inadequate number of viral copies (<131 copies/mL). A negative result must be combined with clinical observations, patient history, and epidemiological information. The expected result is Negative. Fact Sheet for Patients:  PinkCheek.be Fact Sheet for Healthcare Providers:  GravelBags.it This test is not yet ap proved or cleared by the [place] FDA and  has been authorized for detection and/or diagnosis of SARS-CoV-2 by FDA under an Emergency Use Authorization (EUA). This EUA will remain  in effect (meaning this test can be used) for the duration of the COVID-19 declaration under Section 564(b)(1) of the Act, 21 U.S.C. section 360bbb-3(b)(1), unless the authorization is terminated or revoked sooner.    Influenza A by PCR NEGATIVE NEGATIVE Final   Influenza B by PCR NEGATIVE NEGATIVE  Final    Comment: (NOTE) The Xpert Xpress SARS-CoV-2/FLU/RSV assay is intended as an aid in  the diagnosis of influenza from Nasopharyngeal swab specimens and  should not be used as a sole basis for treatment. Nasal washings and  aspirates are unacceptable for Xpert Xpress SARS-CoV-2/FLU/RSV  testing. Fact Sheet for Patients: PinkCheek.be Fact Sheet for Healthcare Providers: GravelBags.it This test is not yet approved or cleared by the [place] FDA and  has been authorized for detection and/or diagnosis of SARS-CoV-2 by  FDA under an Emergency Use Authorization (EUA). This EUA will remain  in effect (meaning this test can be used) for the duration of the  Covid-19 declaration under Section 564(b)(1) of the Act, 21  U.S.C. section 360bbb-3(b)(1), unless the authorization is  terminated or revoked. Performed at [facility], [address] [place], [place] [address]      Discharge Instructions:   Discharge Instructions    Diet general   Complete by: As directed    Discharge instructions   Complete by: As directed    Renal diet Home health Home O2 Continue HD Follow  up with Dr. Burr Medico   Discharge instructions   Complete by: As directed    May only need O2 with exertion, would buy a pulse ox to monitor sats-- >88% is goal-- does not have to be 100% if not symptomatic Also, Marinol may make you sleepy so monitor when taking this (started by Dr. Burr Medico-- call if issues)   Increase activity slowly   Complete by: As directed      Allergies as of 08/13/2019   No Known Allergies     Medication List    STOP taking these medications   olaparib 100 MG tablet Commonly known as: LYNPARZA     TAKE these medications   amLODipine 10 MG tablet Commonly known as: NORVASC Take 10 mg by mouth at bedtime.   apixaban 2.5 MG Tabs tablet Commonly known as: ELIQUIS Take 1 tablet (2.5 mg total) by mouth 2 (two)  times daily.   Auryxia 1 GM 210 MG(Fe) tablet Generic drug: ferric citrate Take 210-630 mg by mouth See admin instructions. 630mg  three times a day with meals and 210mg  with snacks   dronabinol 2.5 MG capsule Commonly known as: MARINOL Take 1 capsule (2.5 mg total) by mouth daily before lunch.   feeding supplement (NEPRO CARB STEADY) Liqd Take 237 mLs by mouth 3 (three) times daily as needed (Supplement).   folic acid 1 MG tablet Commonly known as: FOLVITE Take 1 tablet (1 mg total) by mouth daily.   gabapentin 100 MG capsule Commonly known as: NEURONTIN Take 1 capsule (100 mg total) by mouth at bedtime as needed (pain).   metoprolol tartrate 50 MG tablet Commonly known as: LOPRESSOR Take 50 mg by mouth 2 (two) times daily. What changed: Another medication with the same name was removed. Continue taking this medication, and follow the directions you see here.   multivitamin Tabs tablet Take 1 tablet by mouth 3 (three) times a week. What changed: additional instructions   oxycodone 5 MG capsule Commonly known as: OXY-IR Take 1-2 capsules (5-10 mg total) by mouth every 8 (eight) hours as needed. What changed: reasons to take this   pantoprazole 40 MG tablet Commonly known as: PROTONIX TAKE 1 TABLET BY MOUTH TWICE A DAY   polyethylene glycol 17 g packet Commonly known as: MiraLax Take 17 g by mouth daily. What changed:   when to take this  reasons to take this   senna-docusate 8.6-50 MG tablet Commonly known as: Senokot-S Take 2 tablets by mouth 2 (two) times daily. What changed:   when to take this  reasons to take this            Durable Medical Equipment  (From admission, onward)         Start     Ordered   08/12/19 1649  For home use only DME oxygen  Once    Question Answer Comment  Length of Need 6 Months   Mode or (Route) Nasal cannula   Liters per Minute 2   Frequency Continuous (stationary and portable oxygen unit needed)   Oxygen  conserving device Yes   Oxygen delivery system Gas      08/12/19 1648         Follow-up Information    [facility] [place] Follow up.   Specialty: Home Health Services Why: Home Health services have been set up with [facility]. The agency will call you with set up information. Please call if you have any questions or concerns. Contact information: [address] [place] [address] [phone]        [name], MD Follow up in 1 week(s).   Specialty: Cardiology Contact information: [address] [place] [address] [phone]            Time coordinating discharge: 35 min  Signed:  Geradine Girt DO  [facility] 08/13/2019, 10:18 AM

## 2019-08-13 NOTE — Discharge Instructions (Addendum)
Pulmonary Embolism  A pulmonary embolism (PE) is a sudden blockage or decrease of blood flow in one or both lungs. Most blockages come from a blood clot that forms in the vein of a lower leg, thigh, or arm (deep vein thrombosis, DVT) and travels to the lungs. A clot is blood that has thickened into a gel or solid. PE is a dangerous and life-threatening condition that needs to be treated right away. What are the causes? This condition is usually caused by a blood clot that forms in a vein and moves to the lungs. In rare cases, it may be caused by air, fat, part of a tumor, or other tissue that moves through the veins and into the lungs. What increases the risk? The following factors may make you more likely to develop this condition:  Experiencing a traumatic injury, such as breaking a hip or leg.  Having: ? A spinal cord injury. ? Orthopedic surgery, especially hip or knee replacement. ? Any major surgery. ? A stroke. ? DVT. ? Blood clots or blood clotting disease. ? Long-term (chronic) lung or heart disease. ? Cancer treated with chemotherapy. ? A central venous catheter.  Taking medicines that contain estrogen. These include birth control pills and hormone replacement therapy.  Being: ? Pregnant. ? In the period of time after your baby is delivered (postpartum). ? Older than age 60. ? Overweight. ? A smoker, especially if you have other risks. What are the signs or symptoms? Symptoms of this condition usually start suddenly and include:  Shortness of breath during activity or at rest.  Coughing, coughing up blood, or coughing up blood-tinged mucus.  Chest pain that is often worse with deep breaths.  Rapid or irregular heartbeat.  Feeling light-headed or dizzy.  Fainting.  Feeling anxious.  Fever.  Sweating.  Pain and swelling in a leg. This is a symptom of DVT, which can lead to PE. How is this diagnosed? This condition may be diagnosed based on:  Your medical  history.  A physical exam.  Blood tests.  CT pulmonary angiogram. This test checks blood flow in and around your lungs.  Ventilation-perfusion scan, also called a lung VQ scan. This test measures air flow and blood flow to the lungs.  An ultrasound of the legs. How is this treated? Treatment for this condition depends on many factors, such as the cause of your PE, your risk for bleeding or developing more clots, and other medical conditions you have. Treatment aims to remove, dissolve, or stop blood clots from forming or growing larger. Treatment may include:  Medicines, such as: ? Blood thinning medicines (anticoagulants) to stop clots from forming. ? Medicines that dissolve clots (thrombolytics).  Procedures, such as: ? Using a flexible tube to remove a blood clot (embolectomy) or to deliver medicine to destroy it (catheter-directed thrombolysis). ? Inserting a filter into a large vein that carries blood to the heart (inferior vena cava). This filter (vena cava filter) catches blood clots before they reach the lungs. ? Surgery to remove the clot (surgical embolectomy). This is rare. You may need a combination of immediate, long-term (up to 3 months after diagnosis), and extended (more than 3 months after diagnosis) treatments. Your treatment may continue for several months (maintenance therapy). You and your health care provider will work together to choose the treatment program that is best for you. Follow these instructions at home: Medicines  Take over-the-counter and prescription medicines only as told by your health care provider.  If you   are taking an anticoagulant medicine: ? Take the medicine every day at the same time each day. ? Understand what foods and drugs interact with your medicine. ? Understand the side effects of this medicine, including excessive bruising or bleeding. Ask your health care provider or pharmacist about other side effects. General  instructions  Wear a medical alert bracelet or carry a medical alert card that says you have had a PE and lists what medicines you take.  Ask your health care provider when you may return to your normal activities. Avoid sitting or lying for a long time without moving.  Maintain a healthy weight. Ask your health care provider what weight is healthy for you.  Do not use any products that contain nicotine or tobacco, such as cigarettes, e-cigarettes, and chewing tobacco. If you need help quitting, ask your health care provider.  Talk with your health care provider about any travel plans. It is important to make sure that you are still able to take your medicine while on trips.  Keep all follow-up visits as told by your health care provider. This is important. Contact a health care provider if:  You missed a dose of your blood thinner medicine. Get help right away if:  You have: ? New or increased pain, swelling, warmth, or redness in an arm or leg. ? Numbness or tingling in an arm or leg. ? Shortness of breath during activity or at rest. ? A fever. ? Chest pain. ? A rapid or irregular heartbeat. ? A severe headache. ? Vision changes. ? A serious fall or accident, or you hit your head. ? Stomach (abdominal) pain. ? Blood in your vomit, stool, or urine. ? A cut that will not stop bleeding.  You cough up blood.  You feel light-headed or dizzy.  You cannot move your arms or legs.  You are confused or have memory loss. These symptoms may represent a serious problem that is an emergency. Do not wait to see if the symptoms will go away. Get medical help right away. Call your local emergency services (911 in the [place] Do not drive yourself to the hospital. Summary  A pulmonary embolism (PE) is a sudden blockage or decrease of blood flow in one or both lungs. PE is a dangerous and life-threatening condition that needs to be treated right away.  Treatments for this condition usually  include medicines to thin your blood (anticoagulants) or medicines to break apart blood clots (thrombolytics).  If you are given blood thinners, it is important to take the medicine every day at the same time each day.  Understand what foods and drugs interact with any medicines that you are taking.  If you have signs of PE or DVT, call your local emergency services (911 in the [place] This information is not intended to replace advice given to you by your health care provider. Make sure you discuss any questions you have with your health care provider. Document Revised: 02/10/2018 Document Reviewed: 02/10/2018 Elsevier Patient Education  2020 [employer].  ____________________________________________________  Information on my medicine - ELIQUIS (apixaban)  This medication education was reviewed with me or my healthcare representative as part of my discharge preparation.   Why was Eliquis prescribed for you? Eliquis was prescribed to treat blood clots that may have been found in the veins of your legs (deep vein thrombosis) or in your lungs (pulmonary embolism) and to reduce the risk of them occurring again.  What do You need to know about Eliquis ?  Your dose is 2.5 mg taken TWICE daily.  This dose is reduced based on your renal disease and Dr. Ernestina Penna recommendations.  Eliquis may be taken with or without food.   Try to take the dose about the same time in the morning and in the evening. If you have difficulty swallowing the tablet whole please discuss with your pharmacist how to take the medication safely.  Take Eliquis exactly as prescribed and DO NOT stop taking Eliquis without talking to the doctor who prescribed the medication.  Stopping may increase your risk of developing a new blood clot.  Refill your prescription before you run out.  After discharge, you should have regular check-up appointments with your healthcare provider that is prescribing your Eliquis.    What do  you do if you miss a dose? If a dose of ELIQUIS is not taken at the scheduled time, take it as soon as possible on the same day and twice-daily administration should be resumed. The dose should not be doubled to make up for a missed dose.  Important Safety Information A possible side effect of Eliquis is bleeding. You should call your healthcare provider right away if you experience any of the following: ? Bleeding from an injury or your nose that does not stop. ? Unusual colored urine (red or dark brown) or unusual colored stools (red or black). ? Unusual bruising for unknown reasons. ? A serious fall or if you hit your head (even if there is no bleeding).  Some medicines may interact with Eliquis and might increase your risk of bleeding or clotting while on Eliquis. To help avoid this, consult your healthcare provider or pharmacist prior to using any new prescription or non-prescription medications, including herbals, vitamins, non-steroidal anti-inflammatory drugs (NSAIDs) and supplements.  This website has more information on Eliquis (apixaban): http://www.eliquis.com/eliquis/home

## 2019-08-14 ENCOUNTER — Inpatient Hospital Stay (HOSPITAL_BASED_OUTPATIENT_CLINIC_OR_DEPARTMENT_OTHER)
Admission: EM | Admit: 2019-08-14 | Discharge: 2019-08-19 | DRG: 435 | Disposition: A | Discharge status: Home Health | Payer: Medicare Other | Attending: Internal Medicine | Admitting: Internal Medicine

## 2019-08-14 ENCOUNTER — Other Ambulatory Visit: Payer: Self-pay

## 2019-08-14 ENCOUNTER — Emergency Department (HOSPITAL_BASED_OUTPATIENT_CLINIC_OR_DEPARTMENT_OTHER): Payer: Medicare Other

## 2019-08-14 ENCOUNTER — Encounter (HOSPITAL_BASED_OUTPATIENT_CLINIC_OR_DEPARTMENT_OTHER): Payer: Self-pay | Admitting: Emergency Medicine

## 2019-08-14 ENCOUNTER — Telehealth: Payer: Self-pay | Admitting: Nephrology

## 2019-08-14 DIAGNOSIS — C787 Secondary malignant neoplasm of liver and intrahepatic bile duct: Secondary | ICD-10-CM | POA: Diagnosis present

## 2019-08-14 DIAGNOSIS — Z7901 Long term (current) use of anticoagulants: Secondary | ICD-10-CM | POA: Diagnosis not present

## 2019-08-14 DIAGNOSIS — K219 Gastro-esophageal reflux disease without esophagitis: Secondary | ICD-10-CM | POA: Diagnosis present

## 2019-08-14 DIAGNOSIS — I2699 Other pulmonary embolism without acute cor pulmonale: Secondary | ICD-10-CM | POA: Diagnosis present

## 2019-08-14 DIAGNOSIS — F17210 Nicotine dependence, cigarettes, uncomplicated: Secondary | ICD-10-CM | POA: Diagnosis present

## 2019-08-14 DIAGNOSIS — Z79899 Other long term (current) drug therapy: Secondary | ICD-10-CM | POA: Diagnosis not present

## 2019-08-14 DIAGNOSIS — D6481 Anemia due to antineoplastic chemotherapy: Secondary | ICD-10-CM | POA: Diagnosis present

## 2019-08-14 DIAGNOSIS — N2581 Secondary hyperparathyroidism of renal origin: Secondary | ICD-10-CM | POA: Diagnosis present

## 2019-08-14 DIAGNOSIS — Z20822 Contact with and (suspected) exposure to covid-19: Secondary | ICD-10-CM | POA: Diagnosis present

## 2019-08-14 DIAGNOSIS — E785 Hyperlipidemia, unspecified: Secondary | ICD-10-CM | POA: Diagnosis present

## 2019-08-14 DIAGNOSIS — R509 Fever, unspecified: Secondary | ICD-10-CM | POA: Diagnosis present

## 2019-08-14 DIAGNOSIS — D630 Anemia in neoplastic disease: Secondary | ICD-10-CM | POA: Diagnosis present

## 2019-08-14 DIAGNOSIS — G9341 Metabolic encephalopathy: Secondary | ICD-10-CM | POA: Diagnosis present

## 2019-08-14 DIAGNOSIS — T451X5A Adverse effect of antineoplastic and immunosuppressive drugs, initial encounter: Secondary | ICD-10-CM | POA: Diagnosis present

## 2019-08-14 DIAGNOSIS — Z8249 Family history of ischemic heart disease and other diseases of the circulatory system: Secondary | ICD-10-CM

## 2019-08-14 DIAGNOSIS — Z8701 Personal history of pneumonia (recurrent): Secondary | ICD-10-CM | POA: Diagnosis not present

## 2019-08-14 DIAGNOSIS — C221 Intrahepatic bile duct carcinoma: Secondary | ICD-10-CM | POA: Diagnosis present

## 2019-08-14 DIAGNOSIS — Z992 Dependence on renal dialysis: Secondary | ICD-10-CM | POA: Diagnosis not present

## 2019-08-14 DIAGNOSIS — D631 Anemia in chronic kidney disease: Secondary | ICD-10-CM | POA: Diagnosis present

## 2019-08-14 DIAGNOSIS — M898X9 Other specified disorders of bone, unspecified site: Secondary | ICD-10-CM | POA: Diagnosis present

## 2019-08-14 DIAGNOSIS — N186 End stage renal disease: Secondary | ICD-10-CM

## 2019-08-14 DIAGNOSIS — Z66 Do not resuscitate: Secondary | ICD-10-CM | POA: Diagnosis present

## 2019-08-14 DIAGNOSIS — A419 Sepsis, unspecified organism: Secondary | ICD-10-CM

## 2019-08-14 DIAGNOSIS — I120 Hypertensive chronic kidney disease with stage 5 chronic kidney disease or end stage renal disease: Secondary | ICD-10-CM | POA: Diagnosis present

## 2019-08-14 LAB — COMPREHENSIVE METABOLIC PANEL
ALT: 8 U/L (ref 0–44)
AST: 20 U/L (ref 15–41)
Albumin: 2.3 g/dL — ABNORMAL LOW (ref 3.5–5.0)
Alkaline Phosphatase: 145 U/L — ABNORMAL HIGH (ref 38–126)
Anion gap: 12 (ref 5–15)
BUN: 31 mg/dL — ABNORMAL HIGH (ref 8–23)
CO2: 26 mmol/L (ref 22–32)
Calcium: 7.6 mg/dL — ABNORMAL LOW (ref 8.9–10.3)
Chloride: 97 mmol/L — ABNORMAL LOW (ref 98–111)
Creatinine, Ser: 5.99 mg/dL — ABNORMAL HIGH (ref 0.44–1.00)
GFR calc Af Amer: 7 mL/min — ABNORMAL LOW (ref >60)
GFR calc non Af Amer: 6 mL/min — ABNORMAL LOW (ref >60)
Glucose, Bld: 103 mg/dL — ABNORMAL HIGH (ref 70–99)
Potassium: 4.8 mmol/L (ref 3.5–5.1)
Sodium: 135 mmol/L (ref 135–145)
Total Bilirubin: 0.7 mg/dL (ref 0.3–1.2)
Total Protein: 6.5 g/dL (ref 6.5–8.1)

## 2019-08-14 LAB — CBC WITH DIFFERENTIAL/PLATELET
Abs Immature Granulocytes: 0.13 10*3/uL — ABNORMAL HIGH (ref 0.00–0.07)
Basophils Absolute: 0 10*3/uL (ref 0.0–0.1)
Basophils Relative: 0 %
Eosinophils Absolute: 0.1 10*3/uL (ref 0.0–0.5)
Eosinophils Relative: 1 %
HCT: 25.6 % — ABNORMAL LOW (ref 36.0–46.0)
Hemoglobin: 7.3 g/dL — ABNORMAL LOW (ref 12.0–15.0)
Immature Granulocytes: 1 %
Lymphocytes Relative: 8 %
Lymphs Abs: 0.9 10*3/uL (ref 0.7–4.0)
MCH: 24.3 pg — ABNORMAL LOW (ref 26.0–34.0)
MCHC: 28.5 g/dL — ABNORMAL LOW (ref 30.0–36.0)
MCV: 85.3 fL (ref 80.0–100.0)
Monocytes Absolute: 1.2 10*3/uL — ABNORMAL HIGH (ref 0.1–1.0)
Monocytes Relative: 11 %
Neutro Abs: 9 10*3/uL — ABNORMAL HIGH (ref 1.7–7.7)
Neutrophils Relative %: 79 %
Platelets: 360 10*3/uL (ref 150–400)
RBC: 3 MIL/uL — ABNORMAL LOW (ref 3.87–5.11)
RDW: 21.8 % — ABNORMAL HIGH (ref 11.5–15.5)
WBC: 11.4 10*3/uL — ABNORMAL HIGH (ref 4.0–10.5)
nRBC: 0 % (ref 0.0–0.2)

## 2019-08-14 LAB — LACTIC ACID, PLASMA: Lactic Acid, Venous: 0.7 mmol/L (ref 0.5–1.9)

## 2019-08-14 LAB — PROTIME-INR
INR: 1.3 — ABNORMAL HIGH (ref 0.8–1.2)
Prothrombin Time: 16.3 seconds — ABNORMAL HIGH (ref 11.4–15.2)

## 2019-08-14 LAB — APTT: aPTT: 40 seconds — ABNORMAL HIGH (ref 24–36)

## 2019-08-14 LAB — SARS CORONAVIRUS 2 AG (30 MIN TAT): SARS Coronavirus 2 Ag: NEGATIVE

## 2019-08-14 MED ORDER — METRONIDAZOLE IN NACL 5-0.79 MG/ML-% IV SOLN
500.0000 mg | Freq: Once | INTRAVENOUS | Status: AC
  Administered 2019-08-14: 500 mg via INTRAVENOUS
  Filled 2019-08-14: qty 100

## 2019-08-14 MED ORDER — SODIUM CHLORIDE 0.9 % IV SOLN
2.0000 g | Freq: Once | INTRAVENOUS | Status: AC
  Administered 2019-08-14: 2 g via INTRAVENOUS
  Filled 2019-08-14: qty 2

## 2019-08-14 MED ORDER — ACETAMINOPHEN 650 MG RE SUPP
650.0000 mg | Freq: Once | RECTAL | Status: DC
  Filled 2019-08-14: qty 1

## 2019-08-14 MED ORDER — VANCOMYCIN HCL IN DEXTROSE 500-5 MG/100ML-% IV SOLN
500.0000 mg | INTRAVENOUS | Status: DC
  Filled 2019-08-14: qty 100

## 2019-08-14 MED ORDER — ACETAMINOPHEN 325 MG PO TABS
ORAL_TABLET | ORAL | Status: AC
  Filled 2019-08-14: qty 2

## 2019-08-14 MED ORDER — SODIUM CHLORIDE 0.9 % IV SOLN
1.0000 g | INTRAVENOUS | Status: DC
  Administered 2019-08-15: 1 g via INTRAVENOUS
  Filled 2019-08-14 (×3): qty 1

## 2019-08-14 MED ORDER — VANCOMYCIN HCL IN DEXTROSE 1-5 GM/200ML-% IV SOLN
1000.0000 mg | Freq: Once | INTRAVENOUS | Status: AC
  Administered 2019-08-14: 1000 mg via INTRAVENOUS
  Filled 2019-08-14: qty 200

## 2019-08-14 MED ORDER — ACETAMINOPHEN 325 MG PO TABS
650.0000 mg | ORAL_TABLET | Freq: Once | ORAL | Status: AC
  Administered 2019-08-14: 650 mg via ORAL

## 2019-08-14 MED ORDER — IOHEXOL 300 MG/ML  SOLN
75.0000 mL | Freq: Once | INTRAMUSCULAR | Status: AC | PRN
  Administered 2019-08-14: 75 mL via INTRAVENOUS

## 2019-08-14 NOTE — ED Notes (Addendum)
Pt daughter going home, requests being notified of any changes and when pt has bed placement. Left contact details: [phone] daughter's number [phone] daughter's husband

## 2019-08-14 NOTE — ED Notes (Signed)
Patient is resting comfortably, denies pain

## 2019-08-14 NOTE — ED Notes (Signed)
ED Provider at bedside. 

## 2019-08-14 NOTE — Telephone Encounter (Signed)
Transition of care contact from inpatient facility  Date of Discharge: 08/13/19 Date of Contact: 07/2819 Method of contact: phone - Talked with: daughter Cecille Rubin who is staying with mom and assisting with care  Patient contact to discuss transition of care from recent inpatient hospitalization. Pateint was admitted to [facility] from : 3/25-3/27/21  with the diagnosis of  Acute PE, Fe def anemia  Medication changes were reviewed.  Advised:to stop Aurixya for now due to low P - likely related to poor intake   Patient will follow up at outpatient dialysis on  Monday 08/15/19; EDW lowered - staff to reassess as well.  Other follow up needs: to continue to f/u with Dr. Burr Medico as scheduled.  Amalia Hailey, PA-C [facility] Pager:  [phone]

## 2019-08-14 NOTE — Progress Notes (Signed)
Pharmacy Antibiotic Note  Mary Chapman is a 74 y.o. female admitted on 08/14/2019 with sepsis.  Pharmacy has been consulted for Cefepime and Vancomycin dosing.   Height: 5\' 6"  (167.6 cm) Weight: 111 lb 15.9 oz (50.8 kg) IBW/kg (Calculated) : 59.3  Temp (24hrs), Avg:102.4 F (39.1 C), Min:102.4 F (39.1 C), Max:102.4 F (39.1 C)  Recent Labs  Lab 08/10/19 0736 08/11/19 0218 08/11/19 0219 08/11/19 0658 08/12/19 0350 08/13/19 0336 08/14/19 1928 08/14/19 1956  WBC 8.3 5.5  --   --  7.4 8.2 11.4*  --   CREATININE 5.92*  --  5.40* 3.18* 5.11*  --  5.99*  --   LATICACIDVEN  --   --   --   --   --   --   --  0.7    Estimated Creatinine Clearance: 6.7 mL/min (A) (by C-G formula based on SCr of 5.99 mg/dL (H)).    No Known Allergies  Antimicrobials this admission: 3/28 Cefepime >>  3/28 Vancomycin >>   Dose adjustments this admission: N/a  Microbiology results: Pending   Plan:  - Cefepime 2g IV x 1 dose given at [facility] - Cefepime 2g IV q24h - Vancomycin 1000 mg IV x 1 dose  - Followed by Vancomycin 5000mg  IV qMWF after HD - Monitor patients renal function and urine output  - De-escalate ABX when appropriate   Thank you for allowing pharmacy to be a part of this patient's care.  Duanne Limerick PharmD. BCPS 08/14/2019 9:17 PM

## 2019-08-14 NOTE — ED Notes (Signed)
Patient transported to CT 

## 2019-08-14 NOTE — ED Notes (Signed)
Pt placed on bedpan to attempt to collect urine specimen.

## 2019-08-14 NOTE — ED Notes (Signed)
Pt unable to provide urine specimen. Pt removed from bed pan by patients daughter.

## 2019-08-14 NOTE — ED Provider Notes (Addendum)
[facility] EMERGENCY DEPARTMENT Provider Note   CSN: [id number] Arrival date & time: 08/14/19  1906     History Chief Complaint  Patient presents with  . Fever  . Altered Mental Status    Kadince Woodmansee is a 74 y.o. female.  Patient is a 74 year old who presents with fever and confusion.  She has a history of hypertension, intrahepatic cholangiocarcinoma, end-stage renal disease on dialysis Monday Wednesday Friday, she was recently admitted to the hospital for weakness and hypoxia.  She was found to have PE and was started on Eliquis.  She was sent home with home oxygen to use only as needed.  She was also noted to be anemic with a hemoglobin of 7.8.  This is a drop of her baseline around 10.  It was felt to be related to her chronic disease rather than GI bleeding.  She was discharged from the hospital yesterday.  Her daughter states that she started having a fever today and has been sleeping most of the day.  She has had increased weakness and would normally walk with the assistance of her daughter but was not able to walk today.  She seems to be having increased abdominal pain.  She has had no vomiting or diarrhea.  Her daughter has noted that she has been working and is slow to answer questions.        Past Medical History:  Diagnosis Date  . Diverticulitis   . Focal segmental glomerulosclerosis    ESRD on MWF HD  . Hypertension   . intrahepatic cholangio ca dx'd10/2019  . Renal insufficiency    dialysis pt MWF  . Tobacco abuse     Patient Active Problem List   Diagnosis Date Noted  . Fever of unknown origin 08/14/2019  . Anemia due to end stage renal disease ([place]) 08/11/2019  . Acute respiratory failure ([address]) 08/11/2019  . Pulmonary embolism ([place]) 08/10/2019  . GERD (gastroesophageal reflux disease) 08/10/2019  . Hypokalemia 08/10/2019  . Pneumonia 04/25/2019  . DNR (do not resuscitate) 04/24/2019  . ESRD (end stage renal disease) ([place]) 04/23/2019  .  Community acquired pneumonia of right lower lobe of lung   . Genetic testing 11/30/2018  . Port-A-Cath in place 05/18/2018  . Cough   . Cholangiocarcinoma ([place])   . Sepsis ([place]) 04/09/2018  . Intrahepatic cholangiocarcinoma ([place]) 03/11/2018  . Mass of cecum with bleeding 03/04/2018  . GI bleed 03/01/2018  . Gastrointestinal hemorrhage associated with anorectal source   . Mass of right lobe of liver   . Diverticulitis 02/23/2018  . ESRD on dialysis ([place]) 02/23/2018  . Hypophosphatemia 03/17/2017  . Hyperphosphatemia 03/17/2017  . Elevated troponin 03/16/2017  . Hyperlipidemia 03/16/2017  . SOB (shortness of breath) 03/16/2017  . Focal segmental glomerulosclerosis 03/15/2017  . Nephrotic syndrome 08/20/2016  . Essential hypertension 08/08/2016  . Dyspnea 08/08/2016  . Tobacco abuse   . Anasarca 08/07/2016    Past Surgical History:  Procedure Laterality Date  . AV FISTULA PLACEMENT Left 03/18/2017   Procedure: Brachiocephalic ARTERIOVENOUS (AV) FISTULA CREATION left arm;  Surgeon: Elam Dutch, MD;  Location: [facility];  Service: Vascular;  Laterality: Left;  . BIOPSY  03/05/2018   Procedure: BIOPSY;  Surgeon: Rush Landmark Telford Nab., MD;  Location: [facility];  Service: Gastroenterology;;  enteroscopy bx /cytology brushing Greig Castilla bx  . COLONOSCOPY WITH PROPOFOL N/A 03/05/2018   Procedure: COLONOSCOPY WITH PROPOFOL;  Surgeon: Rush Landmark Telford Nab., MD;  Location: [place] [facility];  Service: Gastroenterology;  Laterality: N/A;  Bx, Spot  . ENTEROSCOPY N/A 03/05/2018   Procedure: ENTEROSCOPY;  Surgeon: Rush Landmark Telford Nab., MD;  Location: [facility];  Service: Gastroenterology;  Laterality: N/A;  BX, Brushing, & Spot  . INSERTION OF DIALYSIS CATHETER N/A 03/18/2017   Procedure: INSERTION OF DIALYSIS CATHETER;  Surgeon: Elam Dutch, MD;  Location: [facility];  Service: Vascular;  Laterality: N/A;  . IR GENERIC HISTORICAL  08/15/2016   IR US GUIDE VASC ACCESS RIGHT 08/15/2016  Arne Cleveland, MD [facility]  . IR GENERIC HISTORICAL  08/15/2016   IR FLUORO GUIDE CV LINE RIGHT 08/15/2016 Arne Cleveland, MD [facility]  . IR IMAGING GUIDED PORT INSERTION  05/06/2018  . IR RADIOLOGIST EVAL & MGMT  06/09/2019  . SUBMUCOSAL INJECTION  03/05/2018   Procedure: SUBMUCOSAL INJECTION;  Surgeon: Rush Landmark Telford Nab., MD;  Location: [facility];  Service: Gastroenterology;;  spot tatoo  . TUBAL LIGATION       OB History   No obstetric history on file.     Family History  Problem Relation Age of Onset  . Hypertension Mother   . Diabetes Mother   . Hypertension Father   . Diabetes Father   . Hypertension Sister   . Hypertension Brother     Social History   Tobacco Use  . Smoking status: Current Some Day Smoker    Packs/day: 0.25    Years: 20.00    Pack years: 5.00    Types: Cigarettes  . Smokeless tobacco: Never Used  . Tobacco comment: 3 cigs/day  Substance Use Topics  . Alcohol use: No    Comment: moderate drinker for 10 years, quit in 30 years  . Drug use: No    Home Medications Prior to Admission medications   Medication Sig Start Date End Date Taking? Authorizing Provider  amLODipine (NORVASC) 10 MG tablet Take 10 mg by mouth at bedtime.  09/15/18   [provider]  apixaban (ELIQUIS) 2.5 MG TABS tablet Take 1 tablet (2.5 mg total) by mouth 2 (two) times daily. 08/12/19   Geradine Girt, DO  AURYXIA 1 GM 210 MG(Fe) tablet Take 210-630 mg by mouth See admin instructions. 630mg  three times a day with meals and 210mg  with snacks 06/08/19   [provider]  dronabinol (MARINOL) 2.5 MG capsule Take 1 capsule (2.5 mg total) by mouth daily before lunch. 08/12/19   Geradine Girt, DO  folic acid (FOLVITE) 1 MG tablet Take 1 tablet (1 mg total) by mouth daily. 04/16/18   Kayleen Memos, DO  gabapentin (NEURONTIN) 100 MG capsule Take 1 capsule (100 mg total) by mouth at bedtime as needed (pain). 04/25/19   Geradine Girt, DO  metoprolol  tartrate (LOPRESSOR) 50 MG tablet Take 50 mg by mouth 2 (two) times daily.    [provider]  multivitamin (RENA-VIT) TABS tablet Take 1 tablet by mouth 3 (three) times a week. Patient taking differently: Take 1 tablet by mouth 3 (three) times a week. MWF 04/25/19   Geradine Girt, DO  Nutritional Supplements (FEEDING SUPPLEMENT, NEPRO CARB STEADY,) LIQD Take 237 mLs by mouth 3 (three) times daily as needed (Supplement). 03/06/18   Ghimire, Henreitta Leber, MD  oxycodone (OXY-IR) 5 MG capsule Take 1-2 capsules (5-10 mg total) by mouth every 8 (eight) hours as needed. Patient taking differently: Take 5-10 mg by mouth every 8 (eight) hours as needed for pain.  07/13/19   Truitt Merle, MD  pantoprazole (PROTONIX) 40 MG tablet TAKE 1 TABLET BY  MOUTH TWICE A DAY Patient taking differently: Take 40 mg by mouth 2 (two) times daily.  08/09/19   Truitt Merle, MD  polyethylene glycol [facility]) packet Take 17 g by mouth daily. Patient taking differently: Take 17 g by mouth daily as needed for mild constipation.  03/06/18   Ghimire, Henreitta Leber, MD  senna-docusate (SENOKOT-S) 8.6-50 MG tablet Take 2 tablets by mouth 2 (two) times daily. Patient taking differently: Take 2 tablets by mouth at bedtime as needed for mild constipation.  04/16/18   Kayleen Memos, DO  prochlorperazine (COMPAZINE) 10 MG tablet Take 1 tablet (10 mg total) by mouth every 6 (six) hours as needed (Nausea or vomiting). 05/10/18 02/22/19  Truitt Merle, MD    Allergies    Patient has no known allergies.  Review of Systems   Review of Systems  Unable to perform ROS: Mental status change    Physical Exam Updated Vital Signs BP (!) 141/82 (BP Location: Right Arm)   Pulse 88   Temp (!) 101.1 F (38.4 C) (Oral)   Resp 18   Ht 5\' 6"  (1.676 m)   Wt 50.8 kg   SpO2 97%   BMI 18.08 kg/m   Physical Exam Constitutional:      Appearance: She is well-developed.  HENT:     Head: Normocephalic and atraumatic.  Eyes:     Pupils: Pupils are  equal, round, and reactive to light.  Cardiovascular:     Rate and Rhythm: Normal rate and regular rhythm.     Heart sounds: Normal heart sounds.  Pulmonary:     Effort: Pulmonary effort is normal. No respiratory distress.     Breath sounds: Normal breath sounds. No wheezing or rales.  Chest:     Chest wall: No tenderness.  Abdominal:     General: Bowel sounds are normal.     Palpations: Abdomen is soft.     Tenderness: There is abdominal tenderness (Moderate diffuse tenderness). There is no guarding or rebound.  Musculoskeletal:        General: Normal range of motion.     Cervical back: Normal range of motion and neck supple.  Lymphadenopathy:     Cervical: No cervical adenopathy.  Skin:    General: Skin is warm and dry.     Findings: No rash.  Neurological:     General: No focal deficit present.     Mental Status: She is alert.     Comments: Patient is confused.  She will tell me her name but cannot tell me where she is or the year.  She is moving all extremities symmetrically without focal deficits.  She has symmetric grip strength bilaterally.  No slurred speech or facial drooping.     ED Results / Procedures / Treatments   Labs (all labs ordered are listed, but only abnormal results are displayed) Labs Reviewed  COMPREHENSIVE METABOLIC PANEL - Abnormal; Notable for the following components:      Result Value   Chloride 97 (*)    Glucose, Bld 103 (*)    BUN 31 (*)    Creatinine, Ser 5.99 (*)    Calcium 7.6 (*)    Albumin 2.3 (*)    Alkaline Phosphatase 145 (*)    GFR calc non Af Amer 6 (*)    GFR calc Af Amer 7 (*)    All other components within normal limits  CBC WITH DIFFERENTIAL/PLATELET - Abnormal; Notable for the following components:   WBC 11.4 (*)  RBC 3.00 (*)    Hemoglobin 7.3 (*)    HCT 25.6 (*)    MCH 24.3 (*)    MCHC 28.5 (*)    RDW 21.8 (*)    Neutro Abs 9.0 (*)    Monocytes Absolute 1.2 (*)    Abs Immature Granulocytes 0.13 (*)    All other  components within normal limits  APTT - Abnormal; Notable for the following components:   aPTT 40 (*)    All other components within normal limits  PROTIME-INR - Abnormal; Notable for the following components:   Prothrombin Time 16.3 (*)    INR 1.3 (*)    All other components within normal limits  SARS CORONAVIRUS 2 AG (30 MIN TAT)  CULTURE, BLOOD (ROUTINE X 2)  CULTURE, BLOOD (ROUTINE X 2)  URINE CULTURE  LACTIC ACID, PLASMA  URINALYSIS, ROUTINE W REFLEX MICROSCOPIC    EKG EKG Interpretation  Date/Time:  Sunday August 14 2019 19:23:59 EDT Ventricular Rate:  92 PR Interval:    QRS Duration: 82 QT Interval:  349 QTC Calculation: 432 R Axis:   25 Text Interpretation: Sinus rhythm Consider left ventricular hypertrophy since last tracing no significant change Confirmed by Malvin Johns [phone]) on 08/14/2019 7:29:25 PM   Radiology CT Head Wo Contrast  Result Date: 08/14/2019 CLINICAL DATA:  Altered mental status, history of recent PE EXAM: CT HEAD WITHOUT CONTRAST TECHNIQUE: Contiguous axial images were obtained from the base of the skull through the vertex without intravenous contrast. COMPARISON:  12/17/2018 FINDINGS: Brain: No findings to suggest acute hemorrhage, acute infarction or space occupying mass lesion are noted. Mild atrophic changes commensurate with the patient's given age are noted. Basal ganglia calcifications are noted. Vascular: No hyperdense vessel or unexpected calcification. Skull: Normal. Negative for fracture or focal lesion. Sinuses/Orbits: No acute abnormality noted. Other: None. IMPRESSION: Mild atrophic changes commensurate with the patient's given age. No acute abnormality is identified. Electronically Signed   By: Inez Catalina M.D.   On: 08/14/2019 22:05   CT Abdomen Pelvis W Contrast  Result Date: 08/14/2019 CLINICAL DATA:  Worsening abdominal pain, history of cholangiocarcinoma EXAM: CT ABDOMEN AND PELVIS WITH CONTRAST TECHNIQUE: Multidetector CT imaging  of the abdomen and pelvis was performed using the standard protocol following bolus administration of intravenous contrast. CONTRAST:  80mL OMNIPAQUE IOHEXOL 300 MG/ML  SOLN COMPARISON:  08/02/2019 FINDINGS: Lower chest: Lung bases demonstrate some mild atelectatic changes. Hepatobiliary: Gallbladder is not well seen. The known large cholangio carcinoma is again seen within the liver measuring at least 12 cm in greatest dimension. Scattered smaller hypodense lesions are noted throughout the liver consistent with local metastatic disease. These are stable in appearance from the prior exam as well. Pancreas: Unremarkable. No pancreatic ductal dilatation or surrounding inflammatory changes. Spleen: Normal in size without focal abnormality. Adrenals/Urinary Tract: Adrenal glands are within normal limits. Kidneys demonstrate a normal enhancement pattern bilaterally. Nonobstructing left renal stones are again seen and stable. No obstructive changes are seen. The bladder is partially distended. Stomach/Bowel: Scattered diverticular change of the colon is noted without evidence of diverticulitis. No obstructive or inflammatory changes are seen. The appendix is within normal limits. No obstructive changes of the small bowel are noted. Vascular/Lymphatic: Aortic atherosclerosis. No enlarged abdominal or pelvic lymph nodes. Reproductive: Uterus and bilateral adnexa are unremarkable. Other: Mild ascites is again identified and stable. Small fat containing left groin hernia is again seen. Musculoskeletal: No acute or significant osseous findings. IMPRESSION: Persistent large neoplastic lesion within the right  lobe of the liver with multiple small metastatic lesions throughout the liver stable in appearance from the prior exam. This is consistent with the given clinical history. Diverticulosis without diverticulitis. Ascites stable in appearance from the prior exam. No definitive peritoneal implants are seen. Nonobstructing left  renal stones unchanged from the prior study. Mild bibasilar atelectatic changes. Previously seen right lower lobe pulmonary nodule is not as well appreciated on today's exam. Electronically Signed   By: Inez Catalina M.D.   On: 08/14/2019 22:17   DG Chest Port 1 View  Result Date: 08/14/2019 CLINICAL DATA:  Shortness of breath and fever EXAM: PORTABLE CHEST 1 VIEW COMPARISON:  August 12, 2019 FINDINGS: The right-sided Port-A-Cath is stable in position. The heart size is stable in size. There is no large focal infiltrate or large pleural effusion. The previously demonstrated bibasilar airspace opacities have improved. There is some mild atelectasis at the lung bases. There is no acute osseous abnormality. IMPRESSION: No active disease. Electronically Signed   By: Constance Holster M.D.   On: 08/14/2019 22:14    Procedures Procedures (including critical care time)  Medications Ordered in ED Medications  vancomycin (VANCOCIN) IVPB 500 mg/100 ml premix (has no administration in time range)  ceFEPIme (MAXIPIME) 1 g in sodium chloride 0.9 % 100 mL IVPB (has no administration in time range)  ceFEPIme (MAXIPIME) 2 g in sodium chloride 0.9 % 100 mL IVPB (0 g Intravenous Stopped 08/14/19 2120)  metroNIDAZOLE (FLAGYL) IVPB 500 mg (0 mg Intravenous Stopped 08/14/19 2212)  vancomycin (VANCOCIN) IVPB 1000 mg/200 mL premix (1,000 mg Intravenous New Bag/Given 08/14/19 2224)  iohexol (OMNIPAQUE) 300 MG/ML solution 75 mL (75 mLs Intravenous Contrast Given 08/14/19 2139)  acetaminophen (TYLENOL) tablet 650 mg (650 mg Oral Given 08/14/19 2131)    ED Course  I have reviewed the triage vital signs and the nursing notes.  Pertinent labs & imaging results that were available during my care of the patient were reviewed by me and considered in my medical decision making (see chart for details).    MDM Rules/Calculators/A&P                      Patient is a 74 year old female who presents with fever and altered mental  status.  She had a recent hospitalization for PE.  Chest x-ray shows no evidence of pneumonia.  She had a CT scan of her abdomen pelvis given her abdominal pain which shows no acute abnormalities or change from prior scans.  She had a CT scan of her head given her altered mental status and this was negative for acute abnormality.  When her fever came down and her mental status improved.  She is alert and oriented.  She has no headache or meningismus which would be more concerning for meningitis.  Given her dialysis, she does not urinate much and has not been able to produce a urine.  I do not see any suggestions of infection around her port site.  Her labs show a stable hemoglobin.  Her white count is mildly elevated.  Her potassium is normal.  Her other labs are nonconcerning.  I spoke with Dr. Flossie Buffy who has accepted the pt for transfer.  Pt will need to go to [facility] given her dialysis.  She was started on broad-spectrum antibiotics.  She was given IV fluids cautiously given her dialysis state.  CRITICAL CARE Performed by: Malvin Johns Total critical care time: 60 minutes Critical care time was exclusive of  separately billable procedures and treating other patients. Critical care was necessary to treat or prevent imminent or life-threatening deterioration. Critical care was time spent personally by me on the following activities: development of treatment plan with patient and/or surrogate as well as nursing, discussions with consultants, evaluation of patient's response to treatment, examination of patient, obtaining history from patient or surrogate, ordering and performing treatments and interventions, ordering and review of laboratory studies, ordering and review of radiographic studies, pulse oximetry and re-evaluation of patient's condition.  Final Clinical Impression(s) / ED Diagnoses Final diagnoses:  Sepsis without acute organ dysfunction, due to unspecified organism [facility])    Rx / [place]  Orders ED Discharge Orders    None       Malvin Johns, MD 08/14/19 [id number]    Malvin Johns, MD 08/14/19 2343

## 2019-08-14 NOTE — ED Triage Notes (Signed)
Per daughter patient came home from [facility] yesterday after admission for PE.  Took eliquis today.  Family member noticed a fever 30 min pta.  Per daughter patient has been more confused since 3pm.  Sent home with O2 at 2L.

## 2019-08-15 DIAGNOSIS — Z992 Dependence on renal dialysis: Secondary | ICD-10-CM

## 2019-08-15 DIAGNOSIS — D631 Anemia in chronic kidney disease: Secondary | ICD-10-CM

## 2019-08-15 DIAGNOSIS — N186 End stage renal disease: Secondary | ICD-10-CM

## 2019-08-15 DIAGNOSIS — C221 Intrahepatic bile duct carcinoma: Principal | ICD-10-CM

## 2019-08-15 DIAGNOSIS — I2699 Other pulmonary embolism without acute cor pulmonale: Secondary | ICD-10-CM

## 2019-08-15 DIAGNOSIS — K219 Gastro-esophageal reflux disease without esophagitis: Secondary | ICD-10-CM

## 2019-08-15 LAB — URINALYSIS, ROUTINE W REFLEX MICROSCOPIC
Bilirubin Urine: NEGATIVE
Glucose, UA: NEGATIVE mg/dL
Ketones, ur: NEGATIVE mg/dL
Leukocytes,Ua: NEGATIVE
Nitrite: NEGATIVE
Protein, ur: >300 mg/dL — AB
Specific Gravity, Urine: 1.015 (ref 1.005–1.030)
pH: 7.5 (ref 5.0–8.0)

## 2019-08-15 LAB — URINALYSIS, MICROSCOPIC (REFLEX): Squamous Epithelial / HPF: >50 (ref 0–5)

## 2019-08-15 LAB — SARS CORONAVIRUS 2 (TAT 6-24 HRS): SARS Coronavirus 2: NEGATIVE

## 2019-08-15 MED ORDER — RENA-VITE PO TABS
1.0000 | ORAL_TABLET | ORAL | Status: DC
  Administered 2019-08-19: 1 via ORAL
  Filled 2019-08-15 (×2): qty 1

## 2019-08-15 MED ORDER — FERRIC CITRATE 1 GM 210 MG(FE) PO TABS
630.0000 mg | ORAL_TABLET | Freq: Three times a day (TID) | ORAL | Status: DC
  Administered 2019-08-15 – 2019-08-16 (×2): 630 mg via ORAL
  Filled 2019-08-15 (×2): qty 3

## 2019-08-15 MED ORDER — ACETAMINOPHEN 325 MG PO TABS
650.0000 mg | ORAL_TABLET | Freq: Four times a day (QID) | ORAL | Status: DC | PRN
  Administered 2019-08-17: 650 mg via ORAL
  Filled 2019-08-15: qty 2

## 2019-08-15 MED ORDER — APIXABAN 2.5 MG PO TABS
2.5000 mg | ORAL_TABLET | Freq: Two times a day (BID) | ORAL | Status: DC
  Administered 2019-08-15 – 2019-08-16 (×3): 2.5 mg via ORAL
  Filled 2019-08-15 (×3): qty 1

## 2019-08-15 MED ORDER — ALBUTEROL SULFATE (2.5 MG/3ML) 0.083% IN NEBU: 2.5000 mg | INHALATION_SOLUTION | Freq: Four times a day (QID) | RESPIRATORY_TRACT | Status: DC | PRN

## 2019-08-15 MED ORDER — SODIUM CHLORIDE 0.9% FLUSH
3.0000 mL | Freq: Two times a day (BID) | INTRAVENOUS | Status: DC
  Administered 2019-08-15 – 2019-08-16 (×3): 3 mL via INTRAVENOUS

## 2019-08-15 MED ORDER — POLYETHYLENE GLYCOL 3350 17 G PO PACK: 17.0000 g | PACK | Freq: Every day | ORAL | Status: DC | PRN

## 2019-08-15 MED ORDER — AMLODIPINE BESYLATE 10 MG PO TABS
10.0000 mg | ORAL_TABLET | Freq: Every day | ORAL | Status: DC
  Administered 2019-08-16 – 2019-08-18 (×3): 10 mg via ORAL
  Filled 2019-08-15 (×4): qty 1

## 2019-08-15 MED ORDER — OXYCODONE HCL 5 MG PO TABS
5.0000 mg | ORAL_TABLET | Freq: Three times a day (TID) | ORAL | Status: DC | PRN
  Administered 2019-08-15 – 2019-08-17 (×4): 5 mg via ORAL
  Administered 2019-08-18: 10 mg via ORAL
  Filled 2019-08-15: qty 1
  Filled 2019-08-15: qty 2
  Filled 2019-08-15 (×3): qty 1

## 2019-08-15 MED ORDER — FERRIC CITRATE 1 GM 210 MG(FE) PO TABS
210.0000 mg | ORAL_TABLET | ORAL | Status: DC
  Filled 2019-08-15: qty 3

## 2019-08-15 MED ORDER — SENNOSIDES-DOCUSATE SODIUM 8.6-50 MG PO TABS: 2.0000 | ORAL_TABLET | Freq: Every evening | ORAL | Status: DC | PRN

## 2019-08-15 MED ORDER — CHLORHEXIDINE GLUCONATE CLOTH 2 % EX PADS
6.0000 | MEDICATED_PAD | Freq: Every day | CUTANEOUS | Status: DC
  Administered 2019-08-15 – 2019-08-19 (×5): 6 via TOPICAL

## 2019-08-15 MED ORDER — ONDANSETRON HCL 4 MG PO TABS: 4.0000 mg | ORAL_TABLET | Freq: Four times a day (QID) | ORAL | Status: DC | PRN

## 2019-08-15 MED ORDER — ACETAMINOPHEN 650 MG RE SUPP: 650.0000 mg | Freq: Four times a day (QID) | RECTAL | Status: DC | PRN

## 2019-08-15 MED ORDER — ONDANSETRON HCL 4 MG/2ML IJ SOLN: 4.0000 mg | Freq: Four times a day (QID) | INTRAMUSCULAR | Status: DC | PRN

## 2019-08-15 MED ORDER — METOPROLOL TARTRATE 50 MG PO TABS
50.0000 mg | ORAL_TABLET | Freq: Two times a day (BID) | ORAL | Status: DC
  Administered 2019-08-16 – 2019-08-19 (×7): 50 mg via ORAL
  Filled 2019-08-15 (×8): qty 1

## 2019-08-15 MED ORDER — GABAPENTIN 100 MG PO CAPS
100.0000 mg | ORAL_CAPSULE | Freq: Every evening | ORAL | Status: DC | PRN
  Administered 2019-08-17: 100 mg via ORAL
  Filled 2019-08-15 (×2): qty 1

## 2019-08-15 MED ORDER — FOLIC ACID 1 MG PO TABS
1.0000 mg | ORAL_TABLET | Freq: Every day | ORAL | Status: DC
  Administered 2019-08-17 – 2019-08-19 (×3): 1 mg via ORAL
  Filled 2019-08-15 (×5): qty 1

## 2019-08-15 MED ORDER — PANTOPRAZOLE SODIUM 40 MG PO TBEC
40.0000 mg | DELAYED_RELEASE_TABLET | Freq: Two times a day (BID) | ORAL | Status: DC
  Administered 2019-08-15 – 2019-08-19 (×8): 40 mg via ORAL
  Filled 2019-08-15 (×8): qty 1

## 2019-08-15 MED ORDER — FERRIC CITRATE 1 GM 210 MG(FE) PO TABS: 210.0000 mg | ORAL_TABLET | ORAL | Status: DC | PRN

## 2019-08-15 MED ORDER — NEPRO/CARBSTEADY PO LIQD: 237.0000 mL | Freq: Three times a day (TID) | ORAL | Status: DC | PRN

## 2019-08-15 MED ORDER — DRONABINOL 2.5 MG PO CAPS
2.5000 mg | ORAL_CAPSULE | Freq: Every day | ORAL | Status: DC
  Administered 2019-08-16 – 2019-08-19 (×4): 2.5 mg via ORAL
  Filled 2019-08-15 (×5): qty 1

## 2019-08-15 NOTE — H&P (Signed)
History and Physical    Alfrieda Tarry NFA:[id number] DOB: Sep 09, 1945 DOA: 08/14/2019  Referring MD/NP/PA:Tu Royal Piedra, DO PCP: Patient, No Pcp Per  Patient coming from: home   Chief Complaint:  I have personally briefly reviewed patient's old medical records in [place]   HPI: Mary Chapman is a 74 y.o. female with medical history significant of HTN, intrahepatic cholangiocarcinoma, ESRD on HD(M/W/F), ED and  pulmonary embolus 3/24 on Eliquis with oxygen as needed.  Presents with complaints of fever and confusion.  She had just been recently hospitalized for hypoxia secondary to acute pulmonary embolus from 3/24-3/27.  During her hospitalization patient reportedly had an episode of fever along with episode of altered mental status that resolved quickly prior to leaving.  However,once she was home patient had repeat of symptoms that she had before leaving the hospital. She had taken eliquis and 1 of her oxycodone at morning. Soon thereafter patient became lethargic and confused.  Her daughter checked her temperature and reported that it read 73 F, she rechecked it a little bit later and noted that it read 102.4 F.  Her daughter called the nursing line and explained to them the symptoms and they recommended her to be brought to the hospital for further evaluation.  At baseline is hard to get the patient to eat and drink much.  She has had a cough that is unchanged.  The only thing that they noticed since being home is that they are only using oxygen if she need it.  At rest her oxygenation was reported to be maintained.  Patient does not recall being confused and is currently alert and oriented to person and place.   ED Course: Upon admission to the emergency department patient was noted to be febrile up to 102.4 F with O2 saturations as low as 87% with improvement on 2 L nasal cannula oxygen.  Labs significant for WBC 11.4, hemoglobin 7.3, potassium 4.8, BUN 31, creatinine 5.99, alkaline  phosphatase 145. CT scan of the brain did not note any acute abnormalities.  CT scan of the abdomen and pelvis appeared to be stable from previous exams with mild bibasilar atelectatic changes.  Initial COVID-19 screening was negative, but confirmatory testing is still pending.  Blood cultures were obtained.  Urinalysis thought to be contaminated due to significant number of squamous epithelial cells.  Patient was started on empiric antibiotics with vancomycin and cefepime.  Review of Systems  Constitutional: Positive for fever and malaise/fatigue.  HENT: Negative for congestion and ear discharge.   Eyes: Negative for photophobia and pain.  Respiratory: Positive for cough (Chronic). Negative for shortness of breath.   Cardiovascular: Negative for chest pain and claudication.  Gastrointestinal: Positive for abdominal pain. Negative for nausea and vomiting.  Genitourinary: Negative for dysuria and hematuria.  Musculoskeletal: Negative for falls and joint pain.  Neurological: Positive for weakness. Negative for focal weakness and loss of consciousness.  Psychiatric/Behavioral: Positive for memory loss. Negative for substance abuse.    Past Medical History:  Diagnosis Date  . Diverticulitis   . Focal segmental glomerulosclerosis    ESRD on MWF HD  . Hypertension   . intrahepatic cholangio ca dx'd10/2019  . Renal insufficiency    dialysis pt MWF  . Tobacco abuse     Past Surgical History:  Procedure Laterality Date  . AV FISTULA PLACEMENT Left 03/18/2017   Procedure: Brachiocephalic ARTERIOVENOUS (AV) FISTULA CREATION left arm;  Surgeon: Elam Dutch, MD;  Location: [facility];  Service: Vascular;  Laterality:  Left;  . BIOPSY  03/05/2018   Procedure: BIOPSY;  Surgeon: Rush Landmark Telford Nab., MD;  Location: [facility];  Service: Gastroenterology;;  enteroscopy bx /cytology brushing Greig Castilla bx  . COLONOSCOPY WITH PROPOFOL N/A 03/05/2018   Procedure: COLONOSCOPY WITH PROPOFOL;  Surgeon:  Rush Landmark Telford Nab., MD;  Location: [facility];  Service: Gastroenterology;  Laterality: N/A;  Bx, Spot  . ENTEROSCOPY N/A 03/05/2018   Procedure: ENTEROSCOPY;  Surgeon: Rush Landmark Telford Nab., MD;  Location: [facility];  Service: Gastroenterology;  Laterality: N/A;  BX, Brushing, & Spot  . INSERTION OF DIALYSIS CATHETER N/A 03/18/2017   Procedure: INSERTION OF DIALYSIS CATHETER;  Surgeon: Elam Dutch, MD;  Location: [facility];  Service: Vascular;  Laterality: N/A;  . IR GENERIC HISTORICAL  08/15/2016   IR US GUIDE VASC ACCESS RIGHT 08/15/2016 Arne Cleveland, MD [facility]  . IR GENERIC HISTORICAL  08/15/2016   IR FLUORO GUIDE CV LINE RIGHT 08/15/2016 Arne Cleveland, MD [facility]  . IR IMAGING GUIDED PORT INSERTION  05/06/2018  . IR RADIOLOGIST EVAL & MGMT  06/09/2019  . SUBMUCOSAL INJECTION  03/05/2018   Procedure: SUBMUCOSAL INJECTION;  Surgeon: Rush Landmark Telford Nab., MD;  Location: [facility];  Service: Gastroenterology;;  spot tatoo  . TUBAL LIGATION       reports that she has been smoking cigarettes. She has a 5.00 pack-year smoking history. She has never used smokeless tobacco. She reports that she does not drink alcohol or use drugs.  No Known Allergies  Family History  Problem Relation Age of Onset  . Hypertension Mother   . Diabetes Mother   . Hypertension Father   . Diabetes Father   . Hypertension Sister   . Hypertension Brother     Prior to Admission medications   Medication Sig Start Date End Date Taking? Authorizing Provider  amLODipine (NORVASC) 10 MG tablet Take 10 mg by mouth at bedtime.  09/15/18   [provider]  apixaban (ELIQUIS) 2.5 MG TABS tablet Take 1 tablet (2.5 mg total) by mouth 2 (two) times daily. 08/12/19   Geradine Girt, DO  AURYXIA 1 GM 210 MG(Fe) tablet Take 210-630 mg by mouth See admin instructions. '630mg'$  three times a day with meals and '210mg'$  with snacks 06/08/19   [provider]  dronabinol (MARINOL) 2.5 MG  capsule Take 1 capsule (2.5 mg total) by mouth daily before lunch. 08/12/19   Geradine Girt, DO  folic acid (FOLVITE) 1 MG tablet Take 1 tablet (1 mg total) by mouth daily. 04/16/18   Kayleen Memos, DO  gabapentin (NEURONTIN) 100 MG capsule Take 1 capsule (100 mg total) by mouth at bedtime as needed (pain). 04/25/19   Geradine Girt, DO  metoprolol tartrate (LOPRESSOR) 50 MG tablet Take 50 mg by mouth 2 (two) times daily.    [provider]  multivitamin (RENA-VIT) TABS tablet Take 1 tablet by mouth 3 (three) times a week. Patient taking differently: Take 1 tablet by mouth 3 (three) times a week. MWF 04/25/19   Geradine Girt, DO  Nutritional Supplements (FEEDING SUPPLEMENT, NEPRO CARB STEADY,) LIQD Take 237 mLs by mouth 3 (three) times daily as needed (Supplement). 03/06/18   Ghimire, Henreitta Leber, MD  oxycodone (OXY-IR) 5 MG capsule Take 1-2 capsules (5-10 mg total) by mouth every 8 (eight) hours as needed. Patient taking differently: Take 5-10 mg by mouth every 8 (eight) hours as needed for pain.  07/13/19   Truitt Merle, MD  pantoprazole (PROTONIX) 40 MG tablet  TAKE 1 TABLET BY MOUTH TWICE A DAY Patient taking differently: Take 40 mg by mouth 2 (two) times daily.  08/09/19   Truitt Merle, MD  polyethylene glycol [facility]) packet Take 17 g by mouth daily. Patient taking differently: Take 17 g by mouth daily as needed for mild constipation.  03/06/18   Ghimire, Henreitta Leber, MD  senna-docusate (SENOKOT-S) 8.6-50 MG tablet Take 2 tablets by mouth 2 (two) times daily. Patient taking differently: Take 2 tablets by mouth at bedtime as needed for mild constipation.  04/16/18   Kayleen Memos, DO  prochlorperazine (COMPAZINE) 10 MG tablet Take 1 tablet (10 mg total) by mouth every 6 (six) hours as needed (Nausea or vomiting). 05/10/18 02/22/19  Truitt Merle, MD    Physical Exam:  Constitutional: Elderly female NAD, calm, comfortable Vitals:   08/15/19 0730 08/15/19 0800 08/15/19 0830 08/15/19 0845  BP:  (!) 147/83 137/79 135/78   Pulse: 91 92 90 89  Resp:   16   Temp:      TempSrc:      SpO2: 97% 94% 91% 96%  Weight:      Height:       Eyes: PERRL, lids and conjunctivae normal ENMT: Mucous membranes are dry. Posterior pharynx clear of any exudate or lesions.   Neck: normal, supple, no masses, no thyromegaly Respiratory: clear to auscultation bilaterally, no wheezing, no crackles. Normal respiratory effort. No accessory muscle use.  Cardiovascular: Regular rate and rhythm, no murmurs / rubs / gallops. No extremity edema. 2+ pedal pulses. No carotid bruits.  Present of the right upper chest wall Abdomen: no tenderness, no masses palpated. No hepatosplenomegaly. Bowel sounds positive.  Musculoskeletal: no clubbing / cyanosis. No joint deformity upper and lower extremities. Good ROM, no contractures. Normal muscle tone.  Skin: no rashes, lesions, ulcers. No induration Neurologic: CN 2-12 grossly intact. Sensation intact, DTR normal. Strength 5/5 in all 4.  Psychiatric: . Alert and oriented x person and place. Normal mood.     Labs on Admission: I have personally reviewed following labs and imaging studies  CBC: Recent Labs  Lab 08/10/19 0736 08/11/19 0218 08/12/19 0350 08/13/19 0336 08/14/19 1928  WBC 8.3 5.5 7.4 8.2 11.4*  NEUTROABS 6.6  --   --   --  9.0*  HGB 7.8* 7.7* 7.3* 7.4* 7.3*  HCT 26.6* 26.6* 25.2* 26.4* 25.6*  MCV 83.6 83.4 84.3 86.0 85.3  PLT 331 322 339 348 951   Basic Metabolic Panel: Recent Labs  Lab 08/10/19 0736 08/11/19 0219 08/11/19 0658 08/12/19 0350 08/14/19 1928  NA 133* 135 137 137 135  K 3.0* 3.5 3.5 3.7 4.8  CL 93* 97* 97* 98 97*  CO2 28 27 30 29 26   GLUCOSE 105* 88 115* 116* 103*  BUN 20 17 7* 18 31*  CREATININE 5.92* 5.40* 3.18* 5.11* 5.99*  CALCIUM 7.7* 7.5* 7.6* 7.5* 7.6*  PHOS  --  2.0* 1.2*  --   --    GFR: Estimated Creatinine Clearance: 6.7 mL/min (A) (by C-G formula based on SCr of 5.99 mg/dL (H)). Liver Function Tests:  Recent Labs  Lab 08/10/19 0736 08/11/19 0219 08/11/19 0658 08/14/19 1928  AST 17  --   --  20  ALT 8  --   --  8  ALKPHOS 122  --   --  145*  BILITOT 0.7  --   --  0.7  PROT 6.7  --   --  6.5  ALBUMIN 2.3* 2.0* 2.1* 2.3*  No results for input(s): LIPASE, AMYLASE in the last 168 hours. No results for input(s): AMMONIA in the last 168 hours. Coagulation Profile: Recent Labs  Lab 08/12/19 0350 08/14/19 1928  INR 1.2 1.3*   Cardiac Enzymes: No results for input(s): CKTOTAL, CKMB, CKMBINDEX, TROPONINI in the last 168 hours. BNP (last 3 results) No results for input(s): PROBNP in the last 8760 hours. HbA1C: No results for input(s): HGBA1C in the last 72 hours. CBG: Recent Labs  Lab 08/12/19 1852  GLUCAP 99   Lipid Profile: No results for input(s): CHOL, HDL, LDLCALC, TRIG, CHOLHDL, LDLDIRECT in the last 72 hours. Thyroid Function Tests: No results for input(s): TSH, T4TOTAL, FREET4, T3FREE, THYROIDAB in the last 72 hours. Anemia Panel: No results for input(s): VITAMINB12, FOLATE, FERRITIN, TIBC, IRON, RETICCTPCT in the last 72 hours. Urine analysis:    Component Value Date/Time   COLORURINE YELLOW 03/14/2017 2330   APPEARANCEUR CLOUDY (A) 03/14/2017 2330   LABSPEC 1.020 03/14/2017 2330   PHURINE 6.0 03/14/2017 2330   GLUCOSEU 100 (A) 03/14/2017 2330   HGBUR MODERATE (A) 03/14/2017 2330   BILIRUBINUR NEGATIVE 03/14/2017 2330   KETONESUR NEGATIVE 03/14/2017 2330   PROTEINUR >300 (A) 03/14/2017 2330   NITRITE NEGATIVE 03/14/2017 2330   LEUKOCYTESUR NEGATIVE 03/14/2017 2330   Sepsis Labs: Recent Results (from the past 240 hour(s))  Respiratory Panel by RT PCR (Flu A&B, Covid) - Nasopharyngeal Swab     Status: None   Collection Time: 08/10/19 11:02 AM   Specimen: Nasopharyngeal Swab  Result Value Ref Range Status   SARS Coronavirus 2 by RT PCR NEGATIVE NEGATIVE Final    Comment: (NOTE) SARS-CoV-2 target nucleic acids are NOT DETECTED. The SARS-CoV-2 RNA is  generally detectable in upper respiratoy specimens during the acute phase of infection. The lowest concentration of SARS-CoV-2 viral copies this assay can detect is 131 copies/mL. A negative result does not preclude SARS-Cov-2 infection and should not be used as the sole basis for treatment or other patient management decisions. A negative result may occur with  improper specimen collection/handling, submission of specimen other than nasopharyngeal swab, presence of viral mutation(s) within the areas targeted by this assay, and inadequate number of viral copies (<131 copies/mL). A negative result must be combined with clinical observations, patient history, and epidemiological information. The expected result is Negative. Fact Sheet for Patients:  PinkCheek.be Fact Sheet for Healthcare Providers:  GravelBags.it This test is not yet ap proved or cleared by the [place] FDA and  has been authorized for detection and/or diagnosis of SARS-CoV-2 by FDA under an Emergency Use Authorization (EUA). This EUA will remain  in effect (meaning this test can be used) for the duration of the COVID-19 declaration under Section 564(b)(1) of the Act, 21 U.S.C. section 360bbb-3(b)(1), unless the authorization is terminated or revoked sooner.    Influenza A by PCR NEGATIVE NEGATIVE Final   Influenza B by PCR NEGATIVE NEGATIVE Final    Comment: (NOTE) The Xpert Xpress SARS-CoV-2/FLU/RSV assay is intended as an aid in  the diagnosis of influenza from Nasopharyngeal swab specimens and  should not be used as a sole basis for treatment. Nasal washings and  aspirates are unacceptable for Xpert Xpress SARS-CoV-2/FLU/RSV  testing. Fact Sheet for Patients: PinkCheek.be Fact Sheet for Healthcare Providers: GravelBags.it This test is not yet approved or cleared by the [place] FDA and   has been authorized for detection and/or diagnosis of SARS-CoV-2 by  FDA under an Emergency Use Authorization (EUA). This EUA will remain  in effect (meaning this test can be used) for the duration of the  Covid-19 declaration under Section 564(b)(1) of the Act, 21  U.S.C. section 360bbb-3(b)(1), unless the authorization is  terminated or revoked. Performed at [facility], [address] [place], [place] [address]   SARS Coronavirus 2 Ag (30 min TAT) - Nasal Swab (BD Veritor Kit)     Status: None   Collection Time: 08/14/19 10:13 PM   Specimen: Nasal Swab (BD Veritor Kit)  Result Value Ref Range Status   SARS Coronavirus 2 Ag NEGATIVE NEGATIVE Final    Comment: (NOTE) SARS-CoV-2 antigen NOT DETECTED.  Negative results are presumptive.  Negative results do not preclude SARS-CoV-2 infection and should not be used as the sole basis for treatment or other patient management decisions, including infection  control decisions, particularly in the presence of clinical signs and  symptoms consistent with COVID-19, or in those who have been in contact with the virus.  Negative results must be combined with clinical observations, patient history, and epidemiological information. The expected result is Negative. Fact Sheet for Patients: PodPark.tn Fact Sheet for Healthcare Providers: GiftContent.is This test is not yet approved or cleared by the [place] FDA and  has been authorized for detection and/or diagnosis of SARS-CoV-2 by FDA under an Emergency Use Authorization (EUA).  This EUA will remain in effect (meaning this test can be used) for the duration of  the COVID-19 de claration under Section 564(b)(1) of the Act, 21 U.S.C. section 360bbb-3(b)(1), unless the authorization is terminated or revoked sooner. Performed at [facility], [address] [place], [place] [address]      Radiological Exams  on Admission: CT Head Wo Contrast  Result Date: 08/14/2019 CLINICAL DATA:  Altered mental status, history of recent PE EXAM: CT HEAD WITHOUT CONTRAST TECHNIQUE: Contiguous axial images were obtained from the base of the skull through the vertex without intravenous contrast. COMPARISON:  12/17/2018 FINDINGS: Brain: No findings to suggest acute hemorrhage, acute infarction or space occupying mass lesion are noted. Mild atrophic changes commensurate with the patient's given age are noted. Basal ganglia calcifications are noted. Vascular: No hyperdense vessel or unexpected calcification. Skull: Normal. Negative for fracture or focal lesion. Sinuses/Orbits: No acute abnormality noted. Other: None. IMPRESSION: Mild atrophic changes commensurate with the patient's given age. No acute abnormality is identified. Electronically Signed   By: Inez Catalina M.D.   On: 08/14/2019 22:05   CT Abdomen Pelvis W Contrast  Result Date: 08/14/2019 CLINICAL DATA:  Worsening abdominal pain, history of cholangiocarcinoma EXAM: CT ABDOMEN AND PELVIS WITH CONTRAST TECHNIQUE: Multidetector CT imaging of the abdomen and pelvis was performed using the standard protocol following bolus administration of intravenous contrast. CONTRAST:  75m OMNIPAQUE IOHEXOL 300 MG/ML  SOLN COMPARISON:  08/02/2019 FINDINGS: Lower chest: Lung bases demonstrate some mild atelectatic changes. Hepatobiliary: Gallbladder is not well seen. The known large cholangio carcinoma is again seen within the liver measuring at least 12 cm in greatest dimension. Scattered smaller hypodense lesions are noted throughout the liver consistent with local metastatic disease. These are stable in appearance from the prior exam as well. Pancreas: Unremarkable. No pancreatic ductal dilatation or surrounding inflammatory changes. Spleen: Normal in size without focal abnormality. Adrenals/Urinary Tract: Adrenal glands are within normal limits. Kidneys demonstrate a normal enhancement  pattern bilaterally. Nonobstructing left renal stones are again seen and stable. No obstructive changes are seen. The bladder is partially distended. Stomach/Bowel: Scattered diverticular change of the colon  is noted without evidence of diverticulitis. No obstructive or inflammatory changes are seen. The appendix is within normal limits. No obstructive changes of the small bowel are noted. Vascular/Lymphatic: Aortic atherosclerosis. No enlarged abdominal or pelvic lymph nodes. Reproductive: Uterus and bilateral adnexa are unremarkable. Other: Mild ascites is again identified and stable. Small fat containing left groin hernia is again seen. Musculoskeletal: No acute or significant osseous findings. IMPRESSION: Persistent large neoplastic lesion within the right lobe of the liver with multiple small metastatic lesions throughout the liver stable in appearance from the prior exam. This is consistent with the given clinical history. Diverticulosis without diverticulitis. Ascites stable in appearance from the prior exam. No definitive peritoneal implants are seen. Nonobstructing left renal stones unchanged from the prior study. Mild bibasilar atelectatic changes. Previously seen right lower lobe pulmonary nodule is not as well appreciated on today's exam. Electronically Signed   By: Inez Catalina M.D.   On: 08/14/2019 22:17   DG Chest Port 1 View  Result Date: 08/14/2019 CLINICAL DATA:  Shortness of breath and fever EXAM: PORTABLE CHEST 1 VIEW COMPARISON:  August 12, 2019 FINDINGS: The right-sided Port-A-Cath is stable in position. The heart size is stable in size. There is no large focal infiltrate or large pleural effusion. The previously demonstrated bibasilar airspace opacities have improved. There is some mild atelectasis at the lung bases. There is no acute osseous abnormality. IMPRESSION: No active disease. Electronically Signed   By: Constance Holster M.D.   On: 08/14/2019 22:14    EKG: Independently  reviewed.  Sinus rhythm at 92 bpm  Assessment/Plan Fever of unknown origin: Acute.  Patient presents with fever up to 102.4 F with reports of increased lethargy.  Chest x-ray was noted to be clear and urinalysis appeared to be contaminated. unclear cause of patient's symptoms at this time.  Blood cultures were obtained.  An initial COVID-19 screening was negative.  Unclear cause of patient's fever -Admit to a medical telemetry -Follow-up blood cultures and urinalysis -Follow-up confirmatory COVID-19 testing  -Continue empiric antibiotics of vancomycin and cefepime -Tylenol as needed for fever  Leukocytosis: Acute.  WBC elevated 11.4.  Question possibility of underlying infection. -Continue to monitor  Acute metabolic encephalopathy: Unclear cause of patient's symptoms, but she appears to be back to baseline at this time.  Patient had similar episode that resolved during her last hospitalization. -Neurochecks.  ESRD on HD: Patient normally dialyzes M/W/F.  Patient was scheduled receiving hemodialysis today. -Appreciate nephrology consultative services  Pulmonary embolism: Patient had been diagnosed with a pulmonary embolus on 3/24.  She was transitioned from heparin to Eliquis 2.5 mg twice daily per Dr. Burr Medico. -Continue Eliquis  Anemia of chronic disease: Hemoglobin 7.3 admission which appears similar to previous.  Noted to be negative.  Patient on ESA with dialysis.  Signs otherwise appears stable at this time. -Consider transfusing blood products if hemoglobin drops lower than 7   Intrahepatic cholangiocarcinoma: Patient followed in the outpatient setting by Dr. Burr Medico.  Previous medications of onolaparib have been discontinued due to risks of an air embolus during last hospitalization. -Continue current pain regimen -Continue outpatient follow-up  Essential hypertension: Blood pressures currently maintained. -Continue metoprolol and amlodipine  GERD -Continue Protonix  DVT  prophylaxis: Eliquis    Code Status: Full Family Communication: Daughter updated over the phone Disposition Plan: Likely discharge home once medically stable Consults called: Nephrology  Admission status: inpatient  Norval Morton MD [facility] Pager [phone]   If 7PM-7AM, please contact night-coverage  [email or web address] Password [facility]  08/15/2019, 9:09 AM

## 2019-08-15 NOTE — ED Notes (Signed)
Daughter at bedside, pt given meal , daughter will assist , report to floor and carelink

## 2019-08-15 NOTE — Progress Notes (Signed)
Was just called regarding this patient. ESRD as well as cholangiocarcinoma- prognosis poor.   She was discharged on 3/27 after diagnosis of PE.  Returned to ER on 3/28 with c/o confusion and fever.  Has just arrived to [facility] from [facility] for admission.  Today would have been HD day.  I notice labs from last night do not indicate urgent need for HD and O2 requirement is 1 L .  I will inform the day team tomorrow to have them see her and she likely will get HD tomorrow off schedule   Louis Meckel

## 2019-08-16 LAB — BASIC METABOLIC PANEL
Anion gap: 11 (ref 5–15)
BUN: 42 mg/dL — ABNORMAL HIGH (ref 8–23)
CO2: 25 mmol/L (ref 22–32)
Calcium: 7.8 mg/dL — ABNORMAL LOW (ref 8.9–10.3)
Chloride: 97 mmol/L — ABNORMAL LOW (ref 98–111)
Creatinine, Ser: 7.63 mg/dL — ABNORMAL HIGH (ref 0.44–1.00)
GFR calc Af Amer: 6 mL/min — ABNORMAL LOW (ref >60)
GFR calc non Af Amer: 5 mL/min — ABNORMAL LOW (ref >60)
Glucose, Bld: 91 mg/dL (ref 70–99)
Potassium: 5 mmol/L (ref 3.5–5.1)
Sodium: 133 mmol/L — ABNORMAL LOW (ref 135–145)

## 2019-08-16 LAB — CBC
HCT: 22.5 % — ABNORMAL LOW (ref 36.0–46.0)
Hemoglobin: 6.7 g/dL — CL (ref 12.0–15.0)
MCH: 24.3 pg — ABNORMAL LOW (ref 26.0–34.0)
MCHC: 29.8 g/dL — ABNORMAL LOW (ref 30.0–36.0)
MCV: 81.5 fL (ref 80.0–100.0)
Platelets: 360 10*3/uL (ref 150–400)
RBC: 2.76 MIL/uL — ABNORMAL LOW (ref 3.87–5.11)
RDW: 21.3 % — ABNORMAL HIGH (ref 11.5–15.5)
WBC: 10.1 10*3/uL (ref 4.0–10.5)
nRBC: 0 % (ref 0.0–0.2)

## 2019-08-16 LAB — URINE CULTURE: Culture: NO GROWTH

## 2019-08-16 LAB — HEMOGLOBIN AND HEMATOCRIT, BLOOD
HCT: 25.2 % — ABNORMAL LOW (ref 36.0–46.0)
Hemoglobin: 7.7 g/dL — ABNORMAL LOW (ref 12.0–15.0)

## 2019-08-16 LAB — PREPARE RBC (CROSSMATCH)

## 2019-08-16 MED ORDER — SODIUM CHLORIDE 0.9% FLUSH
10.0000 mL | Freq: Two times a day (BID) | INTRAVENOUS | Status: DC
  Administered 2019-08-16 (×2): 10 mL

## 2019-08-16 MED ORDER — SODIUM CHLORIDE 0.9% FLUSH: 10.0000 mL | INTRAVENOUS | Status: DC | PRN

## 2019-08-16 MED ORDER — APIXABAN 5 MG PO TABS
5.0000 mg | ORAL_TABLET | Freq: Two times a day (BID) | ORAL | Status: DC
  Administered 2019-08-16 – 2019-08-19 (×6): 5 mg via ORAL
  Filled 2019-08-16 (×6): qty 1

## 2019-08-16 MED ORDER — SODIUM CHLORIDE 0.9% IV SOLUTION: Freq: Once | INTRAVENOUS | Status: DC

## 2019-08-16 MED ORDER — SODIUM ZIRCONIUM CYCLOSILICATE 10 G PO PACK
10.0000 g | PACK | Freq: Once | ORAL | Status: AC
  Administered 2019-08-16: 10 g via ORAL
  Filled 2019-08-16: qty 1

## 2019-08-16 MED ORDER — VANCOMYCIN HCL 500 MG/100ML IV SOLN
500.0000 mg | INTRAVENOUS | Status: DC
  Administered 2019-08-16: 500 mg via INTRAVENOUS
  Filled 2019-08-16: qty 100

## 2019-08-16 NOTE — Plan of Care (Signed)

## 2019-08-16 NOTE — Progress Notes (Signed)
PROGRESS NOTE    Vern Prestia  ZOX:[id number] DOB: 17-Jun-1945 DOA: 08/14/2019 PCP: Patient, No Pcp Per    Brief Narrative:  Patient was admitted to the hospital with a working diagnosis of fever and metabolic encephalopathy in the setting of metastatic cholangiocarcinoma (liver mets) and recent pulmonary embolism.  74 year old female who presented with fever.  She does have significant past medical history for hypertension, intrahepatic cholangiocarcinoma, and end-stage renal disease on hemodialysis.  She was diagnosed with pulmonary embolism March 24, placed on apixaban (half dose).  After her discharge home patient develop persistent fever, 103 and 102.4 F associated with confusion.  Advised to come back to the hospital.  On her initial physical examination blood pressure 147/83, heart rate 91, respiratory rate 16, oxygen saturation 91%, her lungs are clear to auscultation bilaterally, heart S1 and S2 present rhythmic, soft abdomen, no lower extremity edema, no rashes. Sodium 135, potassium 4.7, chloride 97, bicarb 26, glucose 103, BUN 31, creatinine 5.9, AST 20, ALT 8, total bilirubin 0.7, white count 11.4, hemoglobin 7.3, hematocrit 25.6, platelets 360.  SARS COVID-19 negative.  Urinalysis > 300 protein, specific gravity 1.015, more than 50 squamous epithelial cells.  Head CT negative for acute changes.  CT of the pelvis with persistent large neoplastic lesion in the right lobe of the liver, multiple small metastatic lesions to the liver, positive ascites. Her chest x-ray was negative for infiltrates.  EKG, 92 bpm, normal axis, normal intervals, sinus rhythm, positive LVH, no ST segment T wave changes.  Patient has been placed on full dose apixaban for anticoagulation, patient has remained afebrile and antibiotic therapy have been discontinued.   Assessment & Plan:   Principal Problem:   Fever of unknown origin Active Problems:   ESRD on dialysis ([place])   Intrahepatic cholangiocarcinoma  ([place])   Pulmonary embolism ([facility])   GERD (gastroesophageal reflux disease)   Anemia due to end stage renal disease ([place])   1. Pulmonary embolism with fever syndrome. Patient with no signs of systemic infection. Patient has remained afebrile since admission, blood pressure 811 systolic and HR 89.   Will increase dose of apixaban to 5 mg bid full dose and will hold on antibiotic therapy. Continue close monitoring or cell count and cultures.   2. ESRD on HD. Patient with no signs of volume overload, will follow with nephrology recommendations for further HD.  3. Intrahepatic cholangiocarcinoma with anemia of malignancy. Today Hgb down to 6,7, getting today one unit PRBC, will follow on cell count in am, patient with no signs of acute bleeding.   4. Metabolic encephalopathy. Will continue neuro checks per unit protocol. Today she is alert and orientated. Out of bed as tolerated, follow with PT and OT recommendations.   5. HTN. Continue blood pressure control with metoprolol and amlodipine.   DVT prophylaxis: apixaban   Code Status:  full Family Communication: no family at the bedside  Disposition Plan/ discharge barriers: patient from home barrier for dc anemia requiring PRBC transfusion and need for close monitoring to rule out systemic infection.     Consultants:   Nephrology    Subjective: Patient is feeling better but not yet back. No nausea or vomiting, no chest pain or dyspnea.   Objective: Vitals:   08/16/19 0915 08/16/19 0930 08/16/19 1000 08/16/19 1200  BP: (!) 141/83 135/74 136/76 137/73  Pulse: 89 89 87 76  Resp:  18    Temp: 98 F (36.7 C) 98.2 F (36.8 C)  98.5 F (36.9 C)  TempSrc: Oral Oral  Oral  SpO2: 97% 95% 96% 97%  Weight:      Height:        Intake/Output Summary (Last 24 hours) at 08/16/2019 1330 Last data filed at 08/16/2019 1200 Gross per 24 hour  Intake 857.5 ml  Output --  Net 857.5 ml   Filed Weights   08/14/19 1929 08/15/19 1613  Weight:  50.8 kg 51 kg    Examination:   General: Not in pain or dyspnea, deconditioned  Neurology: Awake and alert, non focal  E ENT: positive pallor, no icterus, oral mucosa moist Cardiovascular: No JVD. S1-S2 present, rhythmic, no gallops, rubs, or murmurs. No lower extremity edema. Pulmonary: positive breath sounds bilaterally, adequate air movement, no wheezing, rhonchi or rales. Gastrointestinal. Abdomen with no organomegaly,  Tender to palpation, no rebound or guarding Skin. No rashes Musculoskeletal: no joint deformities     Data Reviewed: I have personally reviewed following labs and imaging studies  CBC: Recent Labs  Lab 08/10/19 0736 08/10/19 0736 08/11/19 0218 08/12/19 0350 08/13/19 0336 08/14/19 1928 08/16/19 0343  WBC 8.3   < > 5.5 7.4 8.2 11.4* 10.1  NEUTROABS 6.6  --   --   --   --  9.0*  --   HGB 7.8*   < > 7.7* 7.3* 7.4* 7.3* 6.7*  HCT 26.6*   < > 26.6* 25.2* 26.4* 25.6* 22.5*  MCV 83.6   < > 83.4 84.3 86.0 85.3 81.5  PLT 331   < > 322 339 348 360 360   < > = values in this interval not displayed.   Basic Metabolic Panel: Recent Labs  Lab 08/11/19 0219 08/11/19 0658 08/12/19 0350 08/14/19 1928 08/16/19 0343  NA 135 137 137 135 133*  K 3.5 3.5 3.7 4.8 5.0  CL 97* 97* 98 97* 97*  CO2 27 30 29 26 25   GLUCOSE 88 115* 116* 103* 91  BUN 17 7* 18 31* 42*  CREATININE 5.40* 3.18* 5.11* 5.99* 7.63*  CALCIUM 7.5* 7.6* 7.5* 7.6* 7.8*  PHOS 2.0* 1.2*  --   --   --    GFR: Estimated Creatinine Clearance: 5.3 mL/min (A) (by C-G formula based on SCr of 7.63 mg/dL (H)). Liver Function Tests: Recent Labs  Lab 08/10/19 0736 08/11/19 0219 08/11/19 0658 08/14/19 1928  AST 17  --   --  20  ALT 8  --   --  8  ALKPHOS 122  --   --  145*  BILITOT 0.7  --   --  0.7  PROT 6.7  --   --  6.5  ALBUMIN 2.3* 2.0* 2.1* 2.3*   No results for input(s): LIPASE, AMYLASE in the last 168 hours. No results for input(s): AMMONIA in the last 168 hours. Coagulation  Profile: Recent Labs  Lab 08/12/19 0350 08/14/19 1928  INR 1.2 1.3*   Cardiac Enzymes: No results for input(s): CKTOTAL, CKMB, CKMBINDEX, TROPONINI in the last 168 hours. BNP (last 3 results) No results for input(s): PROBNP in the last 8760 hours. HbA1C: No results for input(s): HGBA1C in the last 72 hours. CBG: Recent Labs  Lab 08/12/19 1852  GLUCAP 99   Lipid Profile: No results for input(s): CHOL, HDL, LDLCALC, TRIG, CHOLHDL, LDLDIRECT in the last 72 hours. Thyroid Function Tests: No results for input(s): TSH, T4TOTAL, FREET4, T3FREE, THYROIDAB in the last 72 hours. Anemia Panel: No results for input(s): VITAMINB12, FOLATE, FERRITIN, TIBC, IRON, RETICCTPCT in the last 72 hours.    Radiology  Studies: I have reviewed all of the imaging during this hospital visit personally     Scheduled Meds: . sodium chloride   Intravenous Once  . amLODipine  10 mg Oral QHS  . apixaban  2.5 mg Oral BID  . Chlorhexidine Gluconate Cloth  6 each Topical Daily  . dronabinol  2.5 mg Oral QAC lunch  . folic acid  1 mg Oral Daily  . metoprolol tartrate  50 mg Oral BID  . multivitamin  1 tablet Oral Once per day on Mon Wed Fri  . pantoprazole  40 mg Oral BID  . sodium chloride flush  10-40 mL Intracatheter Q12H  . sodium chloride flush  3 mL Intravenous Q12H  . vancomycin  500 mg Intravenous Q M,W,F-HD   Continuous Infusions: . ceFEPime (MAXIPIME) IV 1 g (08/15/19 2245)  . vancomycin 500 mg (08/16/19 1313)     LOS: 2 days        Hadyn Azer Gerome Apley, MD

## 2019-08-16 NOTE — Progress Notes (Deleted)
[facility] Progress Note   Subjective:  [facility] admit 3/24-3/27 with PE.  Has not been to OP dialysis since discharge. Now admitted with fever and anemia. Getting 1 unit prbcs this am.   Seen and examined at bedside. Didn't know she had a fever, until her daughter told her. Doesn't recall feeling particularly bad. Denies CP, SOB, N,V,D.   Objective Vitals:   08/15/19 1800 08/15/19 1900 08/15/19 2229 08/16/19 0500  BP: 126/64 136/63  126/69  Pulse: 90 91  95  Resp: 16 (!) 24  19  Temp:   100 F (37.8 C) 100 F (37.8 C)  TempSrc:    Axillary  SpO2: 97% 96%  95%  Weight:      Height:        Weight change: 0.2 kg   Additional Objective Labs: Basic Metabolic Panel: Recent Labs  Lab 08/11/19 0219 08/11/19 0219 08/11/19 0658 08/11/19 0658 08/12/19 0350 08/14/19 1928 08/16/19 0343  NA 135   < > 137   < > 137 135 133*  K 3.5   < > 3.5   < > 3.7 4.8 5.0  CL 97*   < > 97*   < > 98 97* 97*  CO2 27   < > 30   < > 29 26 25   GLUCOSE 88   < > 115*   < > 116* 103* 91  BUN 17   < > 7*   < > 18 31* 42*  CREATININE 5.40*   < > 3.18*   < > 5.11* 5.99* 7.63*  CALCIUM 7.5*   < > 7.6*   < > 7.5* 7.6* 7.8*  PHOS 2.0*  --  1.2*  --   --   --   --    < > = values in this interval not displayed.   CBC: Recent Labs  Lab 08/10/19 0736 08/10/19 0736 08/11/19 0218 08/11/19 0218 08/12/19 0350 08/12/19 0350 08/13/19 0336 08/14/19 1928 08/16/19 0343  WBC 8.3   < > 5.5   < > 7.4   < > 8.2 11.4* 10.1  NEUTROABS 6.6  --   --   --   --   --   --  9.0*  --   HGB 7.8*   < > 7.7*   < > 7.3*   < > 7.4* 7.3* 6.7*  HCT 26.6*   < > 26.6*   < > 25.2*   < > 26.4* 25.6* 22.5*  MCV 83.6   < > 83.4  --  84.3  --  86.0 85.3 81.5  PLT 331   < > 322   < > 339   < > 348 360 360   < > = values in this interval not displayed.   Blood Culture    Component Value Date/Time   SDES BLOOD RIGHT HAND 04/23/2019 1600   SPECREQUEST  04/23/2019 1600    AEROBIC BOTTLE ONLY Blood Culture results may  not be optimal due to an inadequate volume of blood received in culture bottles   CULT  04/23/2019 1600    NO GROWTH 5 DAYS Performed at [facility], [place] [address], [place], [place] [address]    REPTSTATUS 04/28/2019 FINAL 04/23/2019 1600     Physical Exam General: Elderly female, frail appearing Heart: RRR  Lungs: Clear bilaterally  Abdomen: soft, mild tenderness RUQ  Extremities: No LE edema  Dialysis Access: RUE AVF +bruit   Medications: . ceFEPime (MAXIPIME) IV 1 g (08/15/19  2245)   . sodium chloride   Intravenous Once  . amLODipine  10 mg Oral QHS  . apixaban  2.5 mg Oral BID  . Chlorhexidine Gluconate Cloth  6 each Topical Daily  . dronabinol  2.5 mg Oral QAC lunch  . ferric citrate  630 mg Oral TID WC  . folic acid  1 mg Oral Daily  . metoprolol tartrate  50 mg Oral BID  . multivitamin  1 tablet Oral Once per day on Mon Wed Fri  . pantoprazole  40 mg Oral BID  . sodium chloride flush  3 mL Intravenous Q12H  . vancomycin  500 mg Intravenous Q M,W,F-HD    Dialysis Orders:  HP MWF 3.75h 400/800 EDW 54kg 2K/2.5Ca UFP 2 AVF Heparin 4000 + 2000 midrun  Hectorol 6 Venofer 100 q HD Mircera 225 (last 3/22)    Assessment/Plan: 1. Fever/leukocytosis. CXR clear. COVID negative. Blood cultures neg to date. On empiric Vanc/cefepime -per primary.  2. ESRD -  HD MWF. Missed HD yesterday d/t hosp admission. Last HD 3.26. No urgent indications for dialysis today and she doesn't like the idea of dialysis on back to back days.  K+ 5.0. Getting prbcs today. Will give a dose of Lokelma today. Next HD 1st shift 3/31.  3. HTN/volume-  BP stable. EDW just lowered. Well below EDW by weights here. UF as tolerated.  4. Anemia- Hgb 7.3>6.7. On high dose ESA as outpatient. For transfusion today. Hold heparin next HD.  5. MBD of CKD -  Corr Ca ok. Not on binders. Continue hectorol  6. Nutrition - ongoing anorexia 7. Pulmonary embolism - dx last admission. On Eliquis 2.5 bid  8.  Progressive cholangiocarcinoma. Followed by Dr. Burr Medico. Limited treatment options at this point. Continue ongoing goals of care discussion.    Lynnda Child PA-C [facility] Pager [phone] 08/16/2019,8:20 AM  LOS: 2 days

## 2019-08-16 NOTE — Consult Note (Signed)
[facility] Renal Consultation Note    Indication for Consultation:  Management of ESRD/hemodialysis; anemia, hypertension/volume and secondary hyperparathyroidism   HPI: Mary Chapman is a 74 y.o. female with ESRD on HD, HTN, intrahepatic cholangiocarcinoma (dx 02/2018). Recent [facility] adm 3/24-3/27/21 with acute PE. She was discharged on Eliquis BID.   She is now admitted with fever of unclear etiology. Temp 102.33F on admission. COVID testing negative. Blood/urine cultures collected. CXR w/o infiltrate or edema. Head CT negative for acute findings. Empiric antibiotics started. Labs this am: Na 133, K 5.0 BUN 42 Cr 7.6 Ca 7.8. WBC 10.1 Hgb 6.7.   Seen and examined at bedside. Was lethargic at home and daughter checked temperature, discovered fever and brought her to the ED. Today patients says doesn't recall feeling particularly bad. Denies CP, SOB, N,V,D.   Pavo MWF. Using AVF. Last dialysis 3/26 during prior admission.   Past Medical History:  Diagnosis Date  . Diverticulitis   . Focal segmental glomerulosclerosis    ESRD on MWF HD  . Hypertension   . intrahepatic cholangio ca dx'd10/2019  . Renal insufficiency    dialysis pt MWF  . Tobacco abuse    Past Surgical History:  Procedure Laterality Date  . AV FISTULA PLACEMENT Left 03/18/2017   Procedure: Brachiocephalic ARTERIOVENOUS (AV) FISTULA CREATION left arm;  Surgeon: Elam Dutch, MD;  Location: [place];  Service: Vascular;  Laterality: Left;  . BIOPSY  03/05/2018   Procedure: BIOPSY;  Surgeon: Rush Landmark Telford Nab., MD;  Location: [place];  Service: Gastroenterology;;  enteroscopy bx /cytology brushing Greig Castilla bx  . COLONOSCOPY WITH PROPOFOL N/A 03/05/2018   Procedure: COLONOSCOPY WITH PROPOFOL;  Surgeon: Rush Landmark Telford Nab., MD;  Location: [place];  Service: Gastroenterology;  Laterality: N/A;  Bx, Spot  . ENTEROSCOPY N/A 03/05/2018   Procedure: ENTEROSCOPY;  Surgeon:  Rush Landmark Telford Nab., MD;  Location: [place];  Service: Gastroenterology;  Laterality: N/A;  BX, Brushing, & Spot  . INSERTION OF DIALYSIS CATHETER N/A 03/18/2017   Procedure: INSERTION OF DIALYSIS CATHETER;  Surgeon: Elam Dutch, MD;  Location: [place];  Service: Vascular;  Laterality: N/A;  . IR GENERIC HISTORICAL  08/15/2016   IR US GUIDE VASC ACCESS RIGHT 08/15/2016 Arne Cleveland, MD [facility]  . IR GENERIC HISTORICAL  08/15/2016   IR FLUORO GUIDE CV LINE RIGHT 08/15/2016 Arne Cleveland, MD [facility]  . IR IMAGING GUIDED PORT INSERTION  05/06/2018  . IR RADIOLOGIST EVAL & MGMT  06/09/2019  . SUBMUCOSAL INJECTION  03/05/2018   Procedure: SUBMUCOSAL INJECTION;  Surgeon: Rush Landmark Telford Nab., MD;  Location: [place];  Service: Gastroenterology;;  spot tatoo  . TUBAL LIGATION     Family History  Problem Relation Age of Onset  . Hypertension Mother   . Diabetes Mother   . Hypertension Father   . Diabetes Father   . Hypertension Sister   . Hypertension Brother    Social History:  reports that she has been smoking cigarettes. She has a 5.00 pack-year smoking history. She has never used smokeless tobacco. She reports that she does not drink alcohol or use drugs. No Known Allergies Prior to Admission medications   Medication Sig Start Date End Date Taking? Authorizing Provider  amLODipine (NORVASC) 10 MG tablet Take 10 mg by mouth at bedtime.  09/15/18  Yes [provider]  apixaban (ELIQUIS) 2.5 MG TABS tablet Take 1 tablet (2.5 mg total) by mouth 2 (two) times daily. 08/12/19  Yes Eulogio Bear  U, DO  gabapentin (NEURONTIN) 100 MG capsule Take 1 capsule (100 mg total) by mouth at bedtime as needed (pain). 04/25/19  Yes Eulogio Bear U, DO  multivitamin (RENA-VIT) TABS tablet Take 1 tablet by mouth 3 (three) times a week. Patient taking differently: Take 1 tablet by mouth 3 (three) times a week. MWF 04/25/19  Yes Vann, Tomi Bamberger, DO  Nutritional Supplements  (FEEDING SUPPLEMENT, NEPRO CARB STEADY,) LIQD Take 237 mLs by mouth 3 (three) times daily as needed (Supplement). 03/06/18  Yes Ghimire, Henreitta Leber, MD  oxycodone (OXY-IR) 5 MG capsule Take 1-2 capsules (5-10 mg total) by mouth every 8 (eight) hours as needed. Patient taking differently: Take 5-10 mg by mouth every 8 (eight) hours as needed for pain.  07/13/19  Yes Truitt Merle, MD  pantoprazole (PROTONIX) 40 MG tablet TAKE 1 TABLET BY MOUTH TWICE A DAY Patient taking differently: Take 40 mg by mouth 2 (two) times daily.  08/09/19  Yes Truitt Merle, MD  polyethylene glycol [facility]) packet Take 17 g by mouth daily. Patient taking differently: Take 17 g by mouth daily as needed for mild constipation.  03/06/18  Yes Ghimire, Henreitta Leber, MD  senna-docusate (SENOKOT-S) 8.6-50 MG tablet Take 2 tablets by mouth 2 (two) times daily. Patient taking differently: Take 2 tablets by mouth at bedtime as needed for mild constipation.  04/16/18  Yes Kayleen Memos, DO  dronabinol (MARINOL) 2.5 MG capsule Take 1 capsule (2.5 mg total) by mouth daily before lunch. 08/12/19   Geradine Girt, DO  folic acid (FOLVITE) 1 MG tablet Take 1 tablet (1 mg total) by mouth daily. Patient not taking: Reported on 08/15/2019 04/16/18   Kayleen Memos, DO  prochlorperazine (COMPAZINE) 10 MG tablet Take 1 tablet (10 mg total) by mouth every 6 (six) hours as needed (Nausea or vomiting). 05/10/18 02/22/19  Truitt Merle, MD   Current Facility-Administered Medications  Medication Dose Route Frequency Provider Last Rate Last Admin  . 0.9 %  sodium chloride infusion (Manually program via Guardrails IV Fluids)   Intravenous Once Kirby-Graham, Karsten Fells, NP      . acetaminophen (TYLENOL) tablet 650 mg  650 mg Oral Q6H PRN Norval Morton, MD       Or  . acetaminophen (TYLENOL) suppository 650 mg  650 mg Rectal Q6H PRN Smith, Rondell A, MD      . albuterol (PROVENTIL) (2.5 MG/3ML) 0.083% nebulizer solution 2.5 mg  2.5 mg Nebulization Q6H PRN Smith,  Rondell A, MD      . amLODipine (NORVASC) tablet 10 mg  10 mg Oral QHS Norval Morton, MD   Stopped at 08/15/19 2245  . apixaban (ELIQUIS) tablet 2.5 mg  2.5 mg Oral BID Fuller Plan A, MD   2.5 mg at 08/16/19 0940  . ceFEPIme (MAXIPIME) 1 g in sodium chloride 0.9 % 100 mL IVPB  1 g Intravenous Q24H Smith, Rondell A, MD 200 mL/hr at 08/15/19 2245 1 g at 08/15/19 2245  . Chlorhexidine Gluconate Cloth 2 % PADS 6 each  6 each Topical Daily Norval Morton, MD   6 each at 08/16/19 [phone]  . dronabinol (MARINOL) capsule 2.5 mg  2.5 mg Oral QAC lunch Fuller Plan A, MD   2.5 mg at 08/16/19 1133  . feeding supplement (NEPRO CARB STEADY) liquid 237 mL  237 mL Oral TID PRN Fuller Plan A, MD      . ferric citrate (AURYXIA) tablet 210 mg  210 mg Oral PRN Tamala Julian,  Rondell A, MD      . folic acid (FOLVITE) tablet 1 mg  1 mg Oral Daily Smith, Rondell A, MD      . gabapentin (NEURONTIN) capsule 100 mg  100 mg Oral QHS PRN Smith, Rondell A, MD      . metoprolol tartrate (LOPRESSOR) tablet 50 mg  50 mg Oral BID Fuller Plan A, MD   50 mg at 08/16/19 0940  . multivitamin (RENA-VIT) tablet 1 tablet  1 tablet Oral Once per day on Mon Wed Fri Fuller Plan A, MD      . ondansetron [facility]) tablet 4 mg  4 mg Oral Q6H PRN Norval Morton, MD       Or  . ondansetron (ZOFRAN) injection 4 mg  4 mg Intravenous Q6H PRN Smith, Rondell A, MD      . oxyCODONE (Oxy IR/ROXICODONE) immediate release tablet 5-10 mg  5-10 mg Oral Q8H PRN Fuller Plan A, MD   5 mg at 08/15/19 2040  . pantoprazole (PROTONIX) EC tablet 40 mg  40 mg Oral BID Fuller Plan A, MD   40 mg at 08/16/19 0940  . polyethylene glycol (MIRALAX / GLYCOLAX) packet 17 g  17 g Oral Daily PRN Tamala Julian, Rondell A, MD      . senna-docusate (Senokot-S) tablet 2 tablet  2 tablet Oral QHS PRN Smith, Rondell A, MD      . sodium chloride flush (NS) 0.9 % injection 10-40 mL  10-40 mL Intracatheter Q12H Arrien, Jimmy Picket, MD      . sodium chloride flush (NS)  0.9 % injection 10-40 mL  10-40 mL Intracatheter PRN Arrien, Jimmy Picket, MD      . sodium chloride flush (NS) 0.9 % injection 3 mL  3 mL Intravenous Q12H Smith, Rondell A, MD   3 mL at 08/16/19 0818  . vancomycin (VANCOCIN) IVPB 500 mg/100 ml premix  500 mg Intravenous Q M,W,F-HD Smith, Rondell A, MD         ROS: As per HPI otherwise negative.  Physical Exam: Vitals:   08/16/19 0915 08/16/19 0930 08/16/19 1000 08/16/19 1200  BP: (!) 141/83 135/74 136/76 137/73  Pulse: 89 89 87 76  Resp:  18    Temp: 98 F (36.7 C) 98.2 F (36.8 C)  98.5 F (36.9 C)  TempSrc: Oral Oral  Oral  SpO2: 97% 95% 96% 97%  Weight:      Height:         General: Elderly female, frail appearing, NAD  Head: NCAT sclera not icteric MMM Neck: Supple. No JVD  Lungs: CTA bilaterally without wheezes, rales, or rhonchi. Breathing is unlabored. Heart: RRR with S1 S2 Abdomen: soft mild tenderness RUQ, palpable mass  Lower extremities:without edema or ischemic changes, no open wounds  Neuro: A & O  X 3. Moves all extremities spontaneously. Psych:  Responds to questions appropriately with a normal affect. Dialysis Access: LUE AVF +bruit   Labs: Basic Metabolic Panel: Recent Labs  Lab 08/11/19 0219 08/11/19 0219 08/11/19 0658 08/11/19 0658 08/12/19 0350 08/14/19 1928 08/16/19 0343  NA 135   < > 137   < > 137 135 133*  K 3.5   < > 3.5   < > 3.7 4.8 5.0  CL 97*   < > 97*   < > 98 97* 97*  CO2 27   < > 30   < > 29 26 25   GLUCOSE 88   < > 115*   < > 116*  103* 91  BUN 17   < > 7*   < > 18 31* 42*  CREATININE 5.40*   < > 3.18*   < > 5.11* 5.99* 7.63*  CALCIUM 7.5*   < > 7.6*   < > 7.5* 7.6* 7.8*  PHOS 2.0*  --  1.2*  --   --   --   --    < > = values in this interval not displayed.   Liver Function Tests: Recent Labs  Lab 08/10/19 0736 08/10/19 0736 08/11/19 0219 08/11/19 0658 08/14/19 1928  AST 17  --   --   --  20  ALT 8  --   --   --  8  ALKPHOS 122  --   --   --  145*  BILITOT 0.7  --    --   --  0.7  PROT 6.7  --   --   --  6.5  ALBUMIN 2.3*   < > 2.0* 2.1* 2.3*   < > = values in this interval not displayed.   No results for input(s): LIPASE, AMYLASE in the last 168 hours. No results for input(s): AMMONIA in the last 168 hours. CBC: Recent Labs  Lab 08/10/19 0736 08/10/19 0736 08/11/19 0218 08/11/19 0218 08/12/19 0350 08/12/19 0350 08/13/19 0336 08/14/19 1928 08/16/19 0343  WBC 8.3   < > 5.5   < > 7.4   < > 8.2 11.4* 10.1  NEUTROABS 6.6  --   --   --   --   --   --  9.0*  --   HGB 7.8*   < > 7.7*   < > 7.3*   < > 7.4* 7.3* 6.7*  HCT 26.6*   < > 26.6*   < > 25.2*   < > 26.4* 25.6* 22.5*  MCV 83.6   < > 83.4  --  84.3  --  86.0 85.3 81.5  PLT 331   < > 322   < > 339   < > 348 360 360   < > = values in this interval not displayed.   Cardiac Enzymes: No results for input(s): CKTOTAL, CKMB, CKMBINDEX, TROPONINI in the last 168 hours. CBG: Recent Labs  Lab 08/12/19 1852  GLUCAP 99   Iron Studies: No results for input(s): IRON, TIBC, TRANSFERRIN, FERRITIN in the last 72 hours. Studies/Results: CT Head Wo Contrast  Result Date: 08/14/2019 CLINICAL DATA:  Altered mental status, history of recent PE EXAM: CT HEAD WITHOUT CONTRAST TECHNIQUE: Contiguous axial images were obtained from the base of the skull through the vertex without intravenous contrast. COMPARISON:  12/17/2018 FINDINGS: Brain: No findings to suggest acute hemorrhage, acute infarction or space occupying mass lesion are noted. Mild atrophic changes commensurate with the patient's given age are noted. Basal ganglia calcifications are noted. Vascular: No hyperdense vessel or unexpected calcification. Skull: Normal. Negative for fracture or focal lesion. Sinuses/Orbits: No acute abnormality noted. Other: None. IMPRESSION: Mild atrophic changes commensurate with the patient's given age. No acute abnormality is identified. Electronically Signed   By: Inez Catalina M.D.   On: 08/14/2019 22:05   CT Abdomen  Pelvis W Contrast  Result Date: 08/14/2019 CLINICAL DATA:  Worsening abdominal pain, history of cholangiocarcinoma EXAM: CT ABDOMEN AND PELVIS WITH CONTRAST TECHNIQUE: Multidetector CT imaging of the abdomen and pelvis was performed using the standard protocol following bolus administration of intravenous contrast. CONTRAST:  56mL OMNIPAQUE IOHEXOL 300 MG/ML  SOLN COMPARISON:  08/02/2019 FINDINGS: Lower chest:  Lung bases demonstrate some mild atelectatic changes. Hepatobiliary: Gallbladder is not well seen. The known large cholangio carcinoma is again seen within the liver measuring at least 12 cm in greatest dimension. Scattered smaller hypodense lesions are noted throughout the liver consistent with local metastatic disease. These are stable in appearance from the prior exam as well. Pancreas: Unremarkable. No pancreatic ductal dilatation or surrounding inflammatory changes. Spleen: Normal in size without focal abnormality. Adrenals/Urinary Tract: Adrenal glands are within normal limits. Kidneys demonstrate a normal enhancement pattern bilaterally. Nonobstructing left renal stones are again seen and stable. No obstructive changes are seen. The bladder is partially distended. Stomach/Bowel: Scattered diverticular change of the colon is noted without evidence of diverticulitis. No obstructive or inflammatory changes are seen. The appendix is within normal limits. No obstructive changes of the small bowel are noted. Vascular/Lymphatic: Aortic atherosclerosis. No enlarged abdominal or pelvic lymph nodes. Reproductive: Uterus and bilateral adnexa are unremarkable. Other: Mild ascites is again identified and stable. Small fat containing left groin hernia is again seen. Musculoskeletal: No acute or significant osseous findings. IMPRESSION: Persistent large neoplastic lesion within the right lobe of the liver with multiple small metastatic lesions throughout the liver stable in appearance from the prior exam. This is  consistent with the given clinical history. Diverticulosis without diverticulitis. Ascites stable in appearance from the prior exam. No definitive peritoneal implants are seen. Nonobstructing left renal stones unchanged from the prior study. Mild bibasilar atelectatic changes. Previously seen right lower lobe pulmonary nodule is not as well appreciated on today's exam. Electronically Signed   By: Inez Catalina M.D.   On: 08/14/2019 22:17   DG Chest Port 1 View  Result Date: 08/14/2019 CLINICAL DATA:  Shortness of breath and fever EXAM: PORTABLE CHEST 1 VIEW COMPARISON:  August 12, 2019 FINDINGS: The right-sided Port-A-Cath is stable in position. The heart size is stable in size. There is no large focal infiltrate or large pleural effusion. The previously demonstrated bibasilar airspace opacities have improved. There is some mild atelectasis at the lung bases. There is no acute osseous abnormality. IMPRESSION: No active disease. Electronically Signed   By: Constance Holster M.D.   On: 08/14/2019 22:14    Dialysis Orders:  HP MWF 3.75h 400/800 EDW 54kg 2K/2.5Ca UFP 2 AVF Heparin 4000 + 2000 midrun  Hectorol 6 Venofer 100 q HD Mircera 225 (last 3/22)    Assessment/Plan: 1. Fever/leukocytosis. CXR clear. COVID negative. Blood cultures neg to date. On empiric Vanc/cefepime -per primary.  2. ESRD -  HD MWF. Missed HD yesterday d/t hosp admission. Last HD 3.26. No urgent indications for dialysis today and she doesn't like the idea of dialysis on back to back days.  K+ 5.0. Getting prbcs today. Will give a dose of Lokelma today. Next HD 1st shift 3/31.  3. HTN/volume-  BP stable. EDW just lowered. Well below EDW by weights here. UF as tolerated.  4. Anemia- Hgb 7.3>6.7. On high dose ESA as outpatient. For transfusion today. Hold heparin next HD.  5. MBD of CKD -  Corr Ca ok. Not on binders. Continue hectorol  6. Nutrition - ongoing anorexia 7. Pulmonary embolism - dx last admission. On Eliquis 2.5 bid   8. Progressive cholangiocarcinoma. Followed by Dr. Burr Medico. Limited treatment options at this point. Continue ongoing goals of care discussion.    Lynnda Child PA-C [facility] Pager [phone] 08/16/2019, 1:40 PM

## 2019-08-17 LAB — CBC WITH DIFFERENTIAL/PLATELET
Abs Immature Granulocytes: 0.07 10*3/uL (ref 0.00–0.07)
Basophils Absolute: 0 10*3/uL (ref 0.0–0.1)
Basophils Relative: 0 %
Eosinophils Absolute: 0.1 10*3/uL (ref 0.0–0.5)
Eosinophils Relative: 1 %
HCT: 27.6 % — ABNORMAL LOW (ref 36.0–46.0)
Hemoglobin: 8.4 g/dL — ABNORMAL LOW (ref 12.0–15.0)
Immature Granulocytes: 1 %
Lymphocytes Relative: 7 %
Lymphs Abs: 0.7 10*3/uL (ref 0.7–4.0)
MCH: 24.7 pg — ABNORMAL LOW (ref 26.0–34.0)
MCHC: 30.4 g/dL (ref 30.0–36.0)
MCV: 81.2 fL (ref 80.0–100.0)
Monocytes Absolute: 1.3 10*3/uL — ABNORMAL HIGH (ref 0.1–1.0)
Monocytes Relative: 13 %
Neutro Abs: 8.2 10*3/uL — ABNORMAL HIGH (ref 1.7–7.7)
Neutrophils Relative %: 78 %
Platelets: 407 10*3/uL — ABNORMAL HIGH (ref 150–400)
RBC: 3.4 MIL/uL — ABNORMAL LOW (ref 3.87–5.11)
RDW: 20.2 % — ABNORMAL HIGH (ref 11.5–15.5)
WBC: 10.5 10*3/uL (ref 4.0–10.5)
nRBC: 0 % (ref 0.0–0.2)

## 2019-08-17 LAB — TYPE AND SCREEN
ABO/RH(D): O POS
Antibody Screen: NEGATIVE
Unit division: 0

## 2019-08-17 LAB — RENAL FUNCTION PANEL
Albumin: 2 g/dL — ABNORMAL LOW (ref 3.5–5.0)
Anion gap: 12 (ref 5–15)
BUN: 49 mg/dL — ABNORMAL HIGH (ref 8–23)
CO2: 22 mmol/L (ref 22–32)
Calcium: 7.9 mg/dL — ABNORMAL LOW (ref 8.9–10.3)
Chloride: 96 mmol/L — ABNORMAL LOW (ref 98–111)
Creatinine, Ser: 8.37 mg/dL — ABNORMAL HIGH (ref 0.44–1.00)
GFR calc Af Amer: 5 mL/min — ABNORMAL LOW (ref >60)
GFR calc non Af Amer: 4 mL/min — ABNORMAL LOW (ref >60)
Glucose, Bld: 96 mg/dL (ref 70–99)
Phosphorus: 2.8 mg/dL (ref 2.5–4.6)
Potassium: 4.7 mmol/L (ref 3.5–5.1)
Sodium: 130 mmol/L — ABNORMAL LOW (ref 135–145)

## 2019-08-17 LAB — BPAM RBC
Blood Product Expiration Date: 202104272359
ISSUE DATE / TIME: 202103300857
Unit Type and Rh: 5100

## 2019-08-17 MED ORDER — SODIUM CHLORIDE 0.9 % IV SOLN: 100.0000 mL | INTRAVENOUS | Status: DC | PRN

## 2019-08-17 MED ORDER — VANCOMYCIN HCL IN DEXTROSE 500-5 MG/100ML-% IV SOLN
INTRAVENOUS | Status: AC
  Administered 2019-08-17: 500 mg
  Filled 2019-08-17: qty 100

## 2019-08-17 MED ORDER — LIDOCAINE-PRILOCAINE 2.5-2.5 % EX CREA: 1.0000 "application " | TOPICAL_CREAM | CUTANEOUS | Status: DC | PRN

## 2019-08-17 MED ORDER — ALTEPLASE 2 MG IJ SOLR: 2.0000 mg | Freq: Once | INTRAMUSCULAR | Status: DC | PRN

## 2019-08-17 MED ORDER — HEPARIN SODIUM (PORCINE) 1000 UNIT/ML DIALYSIS: 1000.0000 [IU] | INTRAMUSCULAR | Status: DC | PRN

## 2019-08-17 MED ORDER — PENTAFLUOROPROP-TETRAFLUOROETH EX AERO: 1.0000 "application " | INHALATION_SPRAY | CUTANEOUS | Status: DC | PRN

## 2019-08-17 MED ORDER — MORPHINE SULFATE (PF) 2 MG/ML IV SOLN
2.0000 mg | Freq: Once | INTRAVENOUS | Status: AC
  Administered 2019-08-17: 2 mg via INTRAVENOUS
  Filled 2019-08-17: qty 1

## 2019-08-17 MED ORDER — LIDOCAINE HCL (PF) 1 % IJ SOLN
5.0000 mL | INTRAMUSCULAR | Status: DC | PRN
  Filled 2019-08-17: qty 5

## 2019-08-17 NOTE — Progress Notes (Signed)
PT Cancellation Note  Patient Details Name: Mary Chapman MRN: [id number] DOB: [birth year]   Cancelled Treatment:    Reason Eval/Treat Not Completed: Other (comment).  Pt initially agreed to work with PT, then suddenly turned over on the bed and stated she would not.  States she just doesn't feel right.  Will retry when pt can tolerate.   Ramond Dial 08/17/2019, 3:49 PM   Mee Hives, PT MS Acute Rehab Dept. Number: [place] [facility] [place]

## 2019-08-17 NOTE — Progress Notes (Addendum)
PROGRESS NOTE        PATIENT DETAILS Name: Mary Chapman Age: 74 y.o. Sex: female Date of Birth: 06-18-1945 Admit Date: 08/14/2019 Admitting Physician Norval Morton, MD ZOX:[id number], No Pcp Per  Brief Narrative: Patient is a 74 y.o. female with history of metastatic cholangiocarcinoma, ESRD on HD, HTN-who was diagnosed with pulmonary embolism during her most recent admission (from 3/24-3/27)-and sent home on Eliquis-who presented with confusion and fever.  Significant events: 3/24-3/27>> admit for hypoxemia due to Covid-discharged home on Eliquis. 3/28>> admit for fever and confusion  Antimicrobial therapy: Vancomycin: 3/28>> 3/31 Cefepime: 3/28>> 3/30  Microbiology data: 3/28 >>blood culture: Negative 3/29>> urine culture: Negative  Procedures : None  Consults: Nephrology  DVT Prophylaxis : Full dose anticoagulation with Eliquis  Subjective: Claims she does not feel good-feels weak-has not yet ambulated.  Anxious about going home.  Assessment/Plan: Fever: Etiology not apparent-no infective foci evident.  Blood cultures/urine cultures negative.  CT scan abdomen and chest x-ray without acute infection.  Thought to have either fever secondary to tumor or from recent VTE.  Afebrile for more than 48 hours-remains on Eliquis (dose increased to 5 mg this admission)  Acute metabolic encephalopathy: Currently awake-alert.  Nonfocal exam.  CT head without any acute findings.  Suspect she may have had transient encephalopathy in the setting of febrile illness.  ESRD on hemodialysis: Nephrology following and directing care.  Intrahepatic cholangiocarcinoma: Reviewed last oncology note on 3/26-limited additional treatments.  Per patient-her oncologist has given her a approximate life expectancy of 3 months.  Appears to have very poor overall prognosis.  Have encouraged both patient and her daughter to start goals of care discussion.  Anemia: Suspect  secondary to chronic illness-underlying malignancy and ESRD.  1 unit of PRBC transfused on 3/30.  Hemoglobin this morning stable.  Continue periodic CBC monitoring.  Note-no indication of acute blood loss.  HTN: BP controlled-continue amlodipine  Debility/deconditioning: Complains of "weakness today"-likely secondary to numerous comorbid conditions as outlined above.  Needs PT evaluation prior to consideration of discharge-given that she presented back to the hospital after being discharged.  Goals of care discussion: Remains a full code.  Daughter at bedside-Long discussion-explained poor overall prognosis.  Have encouraged both patient and family to have goals of care discussion.  Patient seems anxious.  Explained to daughter that as his cancer progresses-she is bound to have some acute issue that could be life-threatening.  Advised-regarding DNR status.  Family/patient to discuss-we will follow up tomorrow.   Diet: Diet Order            Diet renal with fluid restriction Fluid restriction: 1200 mL Fluid; Room service appropriate? Yes; Fluid consistency: Thin  Diet effective now               Code Status: Full code  Family Communication: Daughter at bedside  Disposition Plan: Home vs  Home health when ready for discharge-likely on 4/1 if she remains afebrile  Barriers to Discharge: Needs inpatient monitoring for febrile illness-awaiting PT evaluation before consideration of discharge (admitted after being discharged just 1 day before)   Antimicrobial agents: Anti-infectives (From admission, onward)   Start     Dose/Rate Route Frequency Ordered Stop   08/17/19 0957  vancomycin (VANCOCIN) 500-5 MG/100ML-% IVPB    Note to Pharmacy: Kennon Rounds   : cabinet override  08/17/19 0957 08/17/19 1002   08/16/19 1300  vancomycin (VANCOREADY) IVPB 500 mg/100 mL  Status:  Discontinued     500 mg 100 mL/hr over 60 Minutes Intravenous To Hemodialysis 08/16/19 0828 08/16/19 1900    08/15/19 2000  ceFEPIme (MAXIPIME) 1 g in sodium chloride 0.9 % 100 mL IVPB  Status:  Discontinued     1 g 200 mL/hr over 30 Minutes Intravenous Every 24 hours 08/14/19 2116 08/16/19 1454   08/15/19 1200  vancomycin (VANCOCIN) IVPB 500 mg/100 ml premix  Status:  Discontinued     500 mg 100 mL/hr over 60 Minutes Intravenous Every M-W-F (Hemodialysis) 08/14/19 2116 08/16/19 1454   08/14/19 1930  ceFEPIme (MAXIPIME) 2 g in sodium chloride 0.9 % 100 mL IVPB     2 g 200 mL/hr over 30 Minutes Intravenous  Once 08/14/19 1928 08/14/19 2120   08/14/19 1930  metroNIDAZOLE (FLAGYL) IVPB 500 mg     500 mg 100 mL/hr over 60 Minutes Intravenous  Once 08/14/19 1928 08/14/19 2212   08/14/19 1930  vancomycin (VANCOCIN) IVPB 1000 mg/200 mL premix     1,000 mg 200 mL/hr over 60 Minutes Intravenous  Once 08/14/19 1928 08/15/19 0804       Time spent: 25 minutes-Greater than 50% of this time was spent in counseling, explanation of diagnosis, planning of further management, and coordination of care.  MEDICATIONS: Scheduled Meds: . sodium chloride   Intravenous Once  . amLODipine  10 mg Oral QHS  . apixaban  5 mg Oral BID  . Chlorhexidine Gluconate Cloth  6 each Topical Daily  . dronabinol  2.5 mg Oral QAC lunch  . folic acid  1 mg Oral Daily  . metoprolol tartrate  50 mg Oral BID  . multivitamin  1 tablet Oral Once per day on Mon Wed Fri  . pantoprazole  40 mg Oral BID  . sodium chloride flush  10-40 mL Intracatheter Q12H  . sodium chloride flush  3 mL Intravenous Q12H   Continuous Infusions: . [START ON 08/18/2019] sodium chloride    . [START ON 08/18/2019] sodium chloride     PRN Meds:.[START ON 08/18/2019] sodium chloride, [START ON 08/18/2019] sodium chloride, acetaminophen **OR** acetaminophen, albuterol, [START ON 08/18/2019] alteplase, feeding supplement (NEPRO CARB STEADY), gabapentin, [START ON 08/18/2019] heparin, [START ON 08/18/2019] lidocaine (PF), [START ON 08/18/2019] lidocaine-prilocaine,  ondansetron **OR** ondansetron (ZOFRAN) IV, oxyCODONE, [START ON 08/18/2019] pentafluoroprop-tetrafluoroeth, polyethylene glycol, senna-docusate, sodium chloride flush   PHYSICAL EXAM: Vital signs: Vitals:   08/17/19 1040 08/17/19 1115 08/17/19 1143 08/17/19 1251  BP: 128/74 139/81 127/70 130/86  Pulse: 83 83 87 81  Resp: _0 Temp:  99 F (37.2 C) 98.3 F (36.8 C)   TempSrc:  Oral Oral   SpO2:  100% 90% 90%  Weight:  59.4 kg    Height:       Filed Weights   08/15/19 1613 08/17/19 0655 08/17/19 1115  Weight: 51 kg 61.3 kg 59.4 kg   Body mass index is 21.14 kg/m.   Gen Exam:Alert awake-not in any distress HEENT:atraumatic, normocephalic Chest: B/L clear to auscultation anteriorly CVS:S1S2 regular Abdomen:soft mildly tender in the RUQ area which is chronic, non distended Extremities:no edema Neurology: Non focal Skin: no rash  I have personally reviewed following labs and imaging studies  LABORATORY DATA: CBC: Recent Labs  Lab 08/12/19 0350 08/12/19 0350 08/13/19 0336 08/14/19 1928 08/16/19 0343 08/16/19 1425 08/17/19 0405  WBC 7.4  --  8.2 11.4* 10.1  --  10.5  NEUTROABS  --   --   --  9.0*  --   --  8.2*  HGB 7.3*   < > 7.4* 7.3* 6.7* 7.7* 8.4*  HCT 25.2*   < > 26.4* 25.6* 22.5* 25.2* 27.6*  MCV 84.3  --  86.0 85.3 81.5  --  81.2  PLT 339  --  348 360 360  --  407*   < > = values in this interval not displayed.    Basic Metabolic Panel: Recent Labs  Lab 08/11/19 0219 08/11/19 0219 08/11/19 0658 08/12/19 0350 08/14/19 1928 08/16/19 0343 08/17/19 0405  NA 135   < > 137 137 135 133* 130*  K 3.5   < > 3.5 3.7 4.8 5.0 4.7  CL 97*   < > 97* 98 97* 97* 96*  CO2 27   < > _0 GLUCOSE 88   < > 115* 116* 103* 91 96  BUN 17   < > 7* 18 31* 42* 49*  CREATININE 5.40*   < > 3.18* 5.11* 5.99* 7.63* 8.37*  CALCIUM 7.5*   < > 7.6* 7.5* 7.6* 7.8* 7.9*  PHOS 2.0*  --  1.2*  --   --   --  2.8   < > = values in this interval not displayed.     GFR: Estimated Creatinine Clearance: 5.6 mL/min (A) (by C-G formula based on SCr of 8.37 mg/dL (H)).  Liver Function Tests: Recent Labs  Lab 08/11/19 0219 08/11/19 0658 08/14/19 1928 08/17/19 0405  AST  --   --  20  --   ALT  --   --  8  --   ALKPHOS  --   --  145*  --   BILITOT  --   --  0.7  --   PROT  --   --  6.5  --   ALBUMIN 2.0* 2.1* 2.3* 2.0*   No results for input(s): LIPASE, AMYLASE in the last 168 hours. No results for input(s): AMMONIA in the last 168 hours.  Coagulation Profile: Recent Labs  Lab 08/12/19 0350 08/14/19 1928  INR 1.2 1.3*    Cardiac Enzymes: No results for input(s): CKTOTAL, CKMB, CKMBINDEX, TROPONINI in the last 168 hours.  BNP (last 3 results) No results for input(s): PROBNP in the last 8760 hours.  Lipid Profile: No results for input(s): CHOL, HDL, LDLCALC, TRIG, CHOLHDL, LDLDIRECT in the last 72 hours.  Thyroid Function Tests: No results for input(s): TSH, T4TOTAL, FREET4, T3FREE, THYROIDAB in the last 72 hours.  Anemia Panel: No results for input(s): VITAMINB12, FOLATE, FERRITIN, TIBC, IRON, RETICCTPCT in the last 72 hours.  Urine analysis:    Component Value Date/Time   COLORURINE YELLOW 08/15/2019 1251   APPEARANCEUR TURBID (A) 08/15/2019 1251   LABSPEC 1.015 08/15/2019 1251   PHURINE 7.5 08/15/2019 1251   GLUCOSEU NEGATIVE 08/15/2019 1251   HGBUR TRACE (A) 08/15/2019 1251   BILIRUBINUR NEGATIVE 08/15/2019 1251   KETONESUR NEGATIVE 08/15/2019 1251   PROTEINUR >300 (A) 08/15/2019 1251   NITRITE NEGATIVE 08/15/2019 1251   LEUKOCYTESUR NEGATIVE 08/15/2019 1251    Sepsis Labs: Lactic Acid, Venous    Component Value Date/Time   LATICACIDVEN 0.7 08/14/2019 1956    MICROBIOLOGY: Recent Results (from the past 240 hour(s))  Respiratory Panel by RT PCR (Flu A&B, Covid) - Nasopharyngeal Swab     Status: None   Collection Time: 08/10/19 11:02 AM   Specimen: Nasopharyngeal Swab  Result Value Ref  Range Status   SARS  Coronavirus 2 by RT PCR NEGATIVE NEGATIVE Final    Comment: (NOTE) SARS-CoV-2 target nucleic acids are NOT DETECTED. The SARS-CoV-2 RNA is generally detectable in upper respiratoy specimens during the acute phase of infection. The lowest concentration of SARS-CoV-2 viral copies this assay can detect is 131 copies/mL. A negative result does not preclude SARS-Cov-2 infection and should not be used as the sole basis for treatment or other patient management decisions. A negative result may occur with  improper specimen collection/handling, submission of specimen other than nasopharyngeal swab, presence of viral mutation(s) within the areas targeted by this assay, and inadequate number of viral copies (<131 copies/mL). A negative result must be combined with clinical observations, patient history, and epidemiological information. The expected result is Negative. Fact Sheet for Patients:  PinkCheek.be Fact Sheet for Healthcare Providers:  GravelBags.it This test is not yet ap proved or cleared by the [place] FDA and  has been authorized for detection and/or diagnosis of SARS-CoV-2 by FDA under an Emergency Use Authorization (EUA). This EUA will remain  in effect (meaning this test can be used) for the duration of the COVID-19 declaration under Section 564(b)(1) of the Act, 21 U.S.C. section 360bbb-3(b)(1), unless the authorization is terminated or revoked sooner.    Influenza A by PCR NEGATIVE NEGATIVE Final   Influenza B by PCR NEGATIVE NEGATIVE Final    Comment: (NOTE) The Xpert Xpress SARS-CoV-2/FLU/RSV assay is intended as an aid in  the diagnosis of influenza from Nasopharyngeal swab specimens and  should not be used as a sole basis for treatment. Nasal washings and  aspirates are unacceptable for Xpert Xpress SARS-CoV-2/FLU/RSV  testing. Fact Sheet for Patients: PinkCheek.be Fact Sheet  for Healthcare Providers: GravelBags.it This test is not yet approved or cleared by the [place] FDA and  has been authorized for detection and/or diagnosis of SARS-CoV-2 by  FDA under an Emergency Use Authorization (EUA). This EUA will remain  in effect (meaning this test can be used) for the duration of the  Covid-19 declaration under Section 564(b)(1) of the Act, 21  U.S.C. section 360bbb-3(b)(1), unless the authorization is  terminated or revoked. Performed at [facility], [address] [place], [place] [address]   Blood Culture (routine x 2)     Status: None (Preliminary result)   Collection Time: 08/14/19  7:53 PM   Specimen: BLOOD  Result Value Ref Range Status   Specimen Description   Final    BLOOD RIGHT CHEST Performed at [facility] [address] [place], [place] [address]    Special Requests   Final    BOTTLES DRAWN AEROBIC AND ANAEROBIC Blood Culture adequate volume Performed at [facility], [address] [place], [place] [address]    Culture   Final    NO GROWTH 2 DAYS Performed at [facility], [place] [address], [place], [place] [address]    Report Status PENDING  Incomplete  Blood Culture (routine x 2)     Status: None (Preliminary result)   Collection Time: 08/14/19  7:59 PM   Specimen: BLOOD  Result Value Ref Range Status   Specimen Description   Final    BLOOD RIGHT ARM Performed at [facility], [address] [place], [place] [address]    Special Requests   Final    BOTTLES DRAWN AEROBIC AND ANAEROBIC Blood Culture adequate volume Performed at [facility]  [address], [place], [place] [address]    Culture   Final    NO GROWTH 2 DAYS Performed at [facility], [place] [address] [place], [place] [address]    Report Status PENDING  Incomplete  SARS Coronavirus 2 Ag (30 min TAT) - Nasal Swab (BD Veritor Kit)     Status: None   Collection Time:  08/14/19 10:13 PM   Specimen: Nasal Swab (BD Veritor Kit)  Result Value Ref Range Status   SARS Coronavirus 2 Ag NEGATIVE NEGATIVE Final    Comment: (NOTE) SARS-CoV-2 antigen NOT DETECTED.  Negative results are presumptive.  Negative results do not preclude SARS-CoV-2 infection and should not be used as the sole basis for treatment or other patient management decisions, including infection  control decisions, particularly in the presence of clinical signs and  symptoms consistent with COVID-19, or in those who have been in contact with the virus.  Negative results must be combined with clinical observations, patient history, and epidemiological information. The expected result is Negative. Fact Sheet for Patients: PodPark.tn Fact Sheet for Healthcare Providers: GiftContent.is This test is not yet approved or cleared by the [place] FDA and  has been authorized for detection and/or diagnosis of SARS-CoV-2 by FDA under an Emergency Use Authorization (EUA).  This EUA will remain in effect (meaning this test can be used) for the duration of  the COVID-19 de claration under Section 564(b)(1) of the Act, 21 U.S.C. section 360bbb-3(b)(1), unless the authorization is terminated or revoked sooner. Performed at [facility], [address] [place], [place] [address]   SARS CORONAVIRUS 2 (TAT 6-24 HRS) Nasopharyngeal Nasopharyngeal Swab     Status: None   Collection Time: 08/15/19  1:12 AM   Specimen: Nasopharyngeal Swab  Result Value Ref Range Status   SARS Coronavirus 2 NEGATIVE NEGATIVE Final    Comment: (NOTE) SARS-CoV-2 target nucleic acids are NOT DETECTED. The SARS-CoV-2 RNA is generally detectable in upper and lower respiratory specimens during the acute phase of infection. Negative results do not preclude SARS-CoV-2 infection, do not rule out co-infections with other pathogens, and should not be used as  the sole basis for treatment or other patient management decisions. Negative results must be combined with clinical observations, patient history, and epidemiological information. The expected result is Negative. Fact Sheet for Patients: SugarRoll.be Fact Sheet for Healthcare Providers: https://www.woods-mathews.com/ This test is not yet approved or cleared by the [place] FDA and  has been authorized for detection and/or diagnosis of SARS-CoV-2 by FDA under an Emergency Use Authorization (EUA). This EUA will remain  in effect (meaning this test can be used) for the duration of the COVID-19 declaration under Section 56 4(b)(1) of the Act, 21 U.S.C. section 360bbb-3(b)(1), unless the authorization is terminated or revoked sooner. Performed at [facility], [place] [address], [place], [place] [address]   Urine culture     Status: None   Collection Time: 08/15/19 12:51 PM   Specimen: In/Out Cath Urine  Result Value Ref Range Status   Specimen Description   Final    IN/OUT CATH URINE Performed at [facility], [address] [place], [place] [address]    Special Requests   Final    NONE Performed at [facility], [address] [place], [place] [address]    Culture   Final    NO GROWTH Performed at [facility], [place]  [address] [place], [place] [address]    Report Status 08/16/2019 FINAL  Final    RADIOLOGY STUDIES/RESULTS: No results found.   LOS: 3 days   Oren Binet, MD  [facility]    To contact the attending provider between 7A-7P or the covering provider during after hours 7P-7A, please log into the web site [email or web address] and access using universal [place] password for that web site. If you do not have the password, please call the hospital operator.  08/17/2019, 4:00 PM

## 2019-08-17 NOTE — Progress Notes (Signed)
[facility] Progress Note   Subjective:   Seen in HD unit. UFG 2L. Tolerating UF. Afebrile overnight. No new complaints this am. Denies CP, SOB,N/V.   Objective Vitals:   08/16/19 1800 08/16/19 1930 08/17/19 0025 08/17/19 0440  BP: (!) 143/79 124/66 122/71 122/69  Pulse: 87 86 79 75  Resp:  18 18   Temp:  98.2 F (36.8 C)  98.1 F (36.7 C)  TempSrc:  Oral  Oral  SpO2: 96% 98% 96% 97%  Weight:      Height:        Weight change:   51 kg  Additional Objective Labs: Basic Metabolic Panel: Recent Labs  Lab 08/11/19 0219 08/11/19 0219 08/11/19 0658 08/12/19 0350 08/14/19 1928 08/16/19 0343 08/17/19 0405  NA 135   < > 137   < > 135 133* 130*  K 3.5   < > 3.5   < > 4.8 5.0 4.7  CL 97*   < > 97*   < > 97* 97* 96*  CO2 27   < > 30   < > 26 25 22   GLUCOSE 88   < > 115*   < > 103* 91 96  BUN 17   < > 7*   < > 31* 42* 49*  CREATININE 5.40*   < > 3.18*   < > 5.99* 7.63* 8.37*  CALCIUM 7.5*   < > 7.6*   < > 7.6* 7.8* 7.9*  PHOS 2.0*  --  1.2*  --   --   --  2.8   < > = values in this interval not displayed.   CBC: Recent Labs  Lab 08/12/19 0350 08/12/19 0350 08/13/19 0336 08/13/19 0336 08/14/19 1928 08/14/19 1928 08/16/19 0343 08/16/19 1425 08/17/19 0405  WBC 7.4   < > 8.2   < > 11.4*  --  10.1  --  10.5  NEUTROABS  --   --   --   --  9.0*  --   --   --  8.2*  HGB 7.3*   < > 7.4*   < > 7.3*   < > 6.7* 7.7* 8.4*  HCT 25.2*   < > 26.4*   < > 25.6*   < > 22.5* 25.2* 27.6*  MCV 84.3  --  86.0  --  85.3  --  81.5  --  81.2  PLT 339   < > 348   < > 360  --  360  --  407*   < > = values in this interval not displayed.   Blood Culture    Component Value Date/Time   SDES  08/15/2019 1251    IN/OUT CATH URINE Performed at [facility], [address] [place], [place] [address]    [facility]  08/15/2019 1251    NONE Performed at [facility], [address] [place], [place] [address]    CULT  08/15/2019 1251    NO  GROWTH Performed at [facility], [place] [address] [place], [place] [address]    REPTSTATUS 08/16/2019 FINAL 08/15/2019 1251     Physical Exam General: Elderly female, in bed, on dialysis  Heart: RRR  Lungs: Clear, bilaterally  Abdomen: soft mild tenderness RUQ, palpable mass  Extremities: No LE edema  Dialysis Access: LUE AVF in use on HD   Medications: . [START ON 08/18/2019] sodium chloride    . [START ON 08/18/2019] sodium chloride     .  sodium chloride   Intravenous Once  . amLODipine  10 mg Oral QHS  . apixaban  5 mg Oral BID  . Chlorhexidine Gluconate Cloth  6 each Topical Daily  . dronabinol  2.5 mg Oral QAC lunch  . folic acid  1 mg Oral Daily  . metoprolol tartrate  50 mg Oral BID  . multivitamin  1 tablet Oral Once per day on Mon Wed Fri  . pantoprazole  40 mg Oral BID  . sodium chloride flush  10-40 mL Intracatheter Q12H  . sodium chloride flush  3 mL Intravenous Q12H    Dialysis Orders: [facility] MWF 3.75h 400/800 EDW 54kg 2K/2.5Ca UFP 2 AVFHeparin 4000 + 2000 midrun  Hectorol 6 Venofer 100 q HD Mircera 225 (last 3/22)   Assessment/Plan: 1.Fever/leukocytosis on admission. CXR clear. COVID negative. Urine culture neg. Blood cultures neg to date. On empiric Vanc/cefepime -per primary. 2. ESRD -HDMWF. Missed HD d/t hospital admission. Back on schedule today.   3. HTN/volume-BP stable. EDW just lowered. Well below EDW by weights here. UF as tolerated. 4. Anemia-Hgb 6.7> 8.4 s/p 1 unit prbcs 3/30 On high dose ESA as outpatient.   5.MBD of CKD -Corr Ca/ ok. Phos ok w/o binders. Continue hectorol 6. Nutrition -ongoing anorexia. Renal diet/vitamins/prot supp  7. Pulmonary embolism - dx last admission. On Eliquis 2.5 bid  8. Progressive cholangiocarcinoma. Followed by Dr. Burr Medico. Limited treatment options at this point. Continue ongoing goals of care discussions.   Lynnda Child PA-C [facility] Pager [phone] 08/17/2019,8:40 AM   LOS: 3 days

## 2019-08-17 NOTE — Procedures (Addendum)
Seen and examined on dialysis at 7:57 am.  Procedure supervised.  Blood pressure 124/78 and HR 79.  Tolerating goal.  Left AVF in use.  Anemia improved s/p PRBC's  Claudia Desanctis, MD 08/17/2019  8:01 AM

## 2019-08-17 NOTE — Plan of Care (Signed)
  Problem: Education: Goal: Knowledge of General Education information will improve Description Including pain rating scale, medication(s)/side effects and non-pharmacologic comfort measures Outcome: Progressing   Problem: Health Behavior/Discharge Planning: Goal: Ability to manage health-related needs will improve Outcome: Progressing   

## 2019-08-17 NOTE — Progress Notes (Addendum)
Patient back from dialysis. Alert and oriented x 3; confused at times, no acute distress noted, no complaints. VS stable. Will continue to monitor.

## 2019-08-18 DIAGNOSIS — R509 Fever, unspecified: Secondary | ICD-10-CM

## 2019-08-18 MED ORDER — HEPARIN SOD (PORK) LOCK FLUSH 100 UNIT/ML IV SOLN
500.0000 [IU] | INTRAVENOUS | Status: AC | PRN
  Administered 2019-08-18: 500 [IU]
  Filled 2019-08-18: qty 5

## 2019-08-18 MED ORDER — OXYCODONE HCL 5 MG PO TABS
10.0000 mg | ORAL_TABLET | Freq: Three times a day (TID) | ORAL | Status: DC | PRN
  Administered 2019-08-18 – 2019-08-19 (×2): 10 mg via ORAL
  Filled 2019-08-18 (×2): qty 2

## 2019-08-18 NOTE — Progress Notes (Addendum)
[facility] Progress Note   Subjective:   Seen in room. Completed HD yesterday with 1.5L removed. Daughter at bedside today. She has questions about pain control as cancer advances. They are interested in talking to palliative care.   Objective Vitals:   08/17/19 1900 08/17/19 2006 08/17/19 2052 08/18/19 0502  BP:  121/69 127/72 126/67  Pulse:  86 86 70  Resp: 17 20 20 20   Temp: 98.9 F (37.2 C) 100 F (37.8 C)  98.5 F (36.9 C)  TempSrc: Oral Oral  Oral  SpO2:  94% 93% 91%  Weight:    55.4 kg  Height:        Weight change: -1.9 kg   Additional Objective Labs: Basic Metabolic Panel: Recent Labs  Lab 08/14/19 1928 08/16/19 0343 08/17/19 0405  NA 135 133* 130*  K 4.8 5.0 4.7  CL 97* 97* 96*  CO2 26 25 22   GLUCOSE 103* 91 96  BUN 31* 42* 49*  CREATININE 5.99* 7.63* 8.37*  CALCIUM 7.6* 7.8* 7.9*  PHOS  --   --  2.8   CBC: Recent Labs  Lab 08/12/19 0350 08/12/19 0350 08/13/19 0336 08/13/19 0336 08/14/19 1928 08/14/19 1928 08/16/19 0343 08/16/19 1425 08/17/19 0405  WBC 7.4   < > 8.2   < > 11.4*  --  10.1  --  10.5  NEUTROABS  --   --   --   --  9.0*  --   --   --  8.2*  HGB 7.3*   < > 7.4*   < > 7.3*   < > 6.7* 7.7* 8.4*  HCT 25.2*   < > 26.4*   < > 25.6*   < > 22.5* 25.2* 27.6*  MCV 84.3  --  86.0  --  85.3  --  81.5  --  81.2  PLT 339   < > 348   < > 360  --  360  --  407*   < > = values in this interval not displayed.   Blood Culture    Component Value Date/Time   SDES  08/15/2019 1251    IN/OUT CATH URINE Performed at [facility], [address] [place], [place] [address]    [facility]  08/15/2019 1251    NONE Performed at [facility], [address], [place], [place] [address]    CULT  08/15/2019 1251    NO GROWTH Performed at [facility], [place] [address] [place], [place] [address]    REPTSTATUS 08/16/2019 FINAL 08/15/2019 1251     Physical Exam General: Elderly female, in bed, nad   Heart: RRR  Lungs: Clear, bilaterally  Abdomen: soft mild tenderness RUQ, palpable mass  Extremities: No LE edema  Dialysis Access: LUE AVF +bruit   Medications: . sodium chloride    . sodium chloride     . sodium chloride   Intravenous Once  . amLODipine  10 mg Oral QHS  . apixaban  5 mg Oral BID  . Chlorhexidine Gluconate Cloth  6 each Topical Daily  . dronabinol  2.5 mg Oral QAC lunch  . folic acid  1 mg Oral Daily  . metoprolol tartrate  50 mg Oral BID  . multivitamin  1 tablet Oral Once per day on Mon Wed Fri  . pantoprazole  40 mg Oral BID  . sodium chloride flush  10-40 mL Intracatheter Q12H  . sodium chloride flush  3 mL Intravenous Q12H  Dialysis Orders: HP MWF 3.75h 400/800 EDW 54kg 2K/2.5Ca UFP 2 AVFHeparin 4000 + 2000 midrun  Hectorol 6 Venofer 100 q HD Mircera 225 (last 3/22)   Assessment/Plan: 1.Fever/leukocytosis on admission. CXR clear. COVID negative. Urine culture neg. Blood cultures neg to date. Antibiotics stopped. Etiology appears noninfectious.  2. ESRD -HDMWF. Next HD 4/2.  3. HTN/volume-BP stable. EDW just lowered. Weights variable. UF as tolerated. 4. Anemia-Hgb 6.7> 8.4 s/p 1 unit prbcs 3/30 On high dose ESA as outpatient.   5.MBD of CKD -Corr Ca/ ok. Phos ok/low w/o binders. Continue hectorol 6. Nutrition -ongoing anorexia. Renal diet/vitamins/prot supp  7. Pulmonary embolism - dx last admission. On Eliquis 2.5 bid  8. Progressive cholangiocarcinoma. Followed by Dr. Burr Medico. Limited treatment options at this point. Continue ongoing goals of care discussions. Primary will arrange for outpatient follow-up with palliative care.   Lynnda Child PA-C [facility] Pager [phone] 08/18/2019,9:54 AM  LOS: 4 days

## 2019-08-18 NOTE — Evaluation (Signed)
Physical Therapy Evaluation Patient Details Name: Mary Chapman MRN: [id number] DOB: 1946-01-07 Today's Date: 08/18/2019   History of Present Illness  Mary Chapman is a 74 y.o. female with medical history significant of HTN, intrahepatic cholangiocarcinoma, ESRD on HD(M/W/F), ED and  pulmonary embolus 3/24 on Eliquis with oxygen as needed.  Presents with complaints of fever and confusion.   Clinical Impression  Pt admitted with/for confusion and fever.  Pt present very weak and without energy.  She needs min guard to min assist for basic mobility to gait. Marland Kitchen  Pt currently limited functionally due to the problems listed below.  (see problems list.)  Pt will benefit from PT to maximize function and safety to be able to get home safely with available assist .     Follow Up Recommendations Home health PT    Equipment Recommendations  Other (comment)(rollator)    Recommendations for Other Services       Precautions / Restrictions Precautions Precautions: Fall Precaution Comments: Watch O2 sats Restrictions Weight Bearing Restrictions: No      Mobility  Bed Mobility Overal bed mobility: Needs Assistance Bed Mobility: Sit to Supine       Sit to supine: Min assist   General bed mobility comments: slow with struggle getting legs into bed  Transfers Overall transfer level: Needs assistance Equipment used: None Transfers: Sit to/from Stand Sit to Stand: Min assist         General transfer comment: Min A for steadying assist.   Ambulation/Gait Ambulation/Gait assistance: Min assist Gait Distance (Feet): 160 Feet(with 3 standing rests to regroup) Assistive device: 1 person hand held assist;None Gait Pattern/deviations: Step-through pattern;Decreased step length - right;Decreased step length - left;Decreased stride length Gait velocity: Decreased Gait velocity interpretation: <1.8 ft/sec, indicate of risk for recurrent falls General Gait Details: slow, guarded, wandering right.   Mildly more unsteady with scanning  Stairs            Wheelchair Mobility    Modified Rankin (Stroke Patients Only)       Balance                                             Pertinent Vitals/Pain Pain Assessment: Faces Faces Pain Scale: No hurt Pain Intervention(s): Monitored during session    Home Living Family/patient expects to be discharged to:: Private residence Living Arrangements: Children Available Help at Discharge: Family;Available 24 hours/day Type of Home: House Home Access: Stairs to enter Entrance Stairs-Rails: Right Entrance Stairs-Number of Steps: 3 Home Layout: One level Home Equipment: Cane - single point      Prior Function Level of Independence: Independent               Hand Dominance        Extremity/Trunk Assessment   Upper Extremity Assessment Upper Extremity Assessment: Generalized weakness    Lower Extremity Assessment Lower Extremity Assessment: Generalized weakness    Cervical / Trunk Assessment Cervical / Trunk Assessment: Normal  Communication   Communication: No difficulties  Cognition Arousal/Alertness: Awake/alert Behavior During Therapy: WFL for tasks assessed/performed Overall Cognitive Status: No family/caregiver present to determine baseline cognitive functioning                                        General Comments General  comments (skin integrity, edema, etc.): sats on RA up in the higher 90's    Exercises     Assessment/Plan    PT Assessment Patient needs continued PT services  PT Problem List Decreased activity tolerance;Decreased strength;Cardiopulmonary status limiting activity       PT Treatment Interventions Gait training;Functional mobility training;Therapeutic activities;Balance training;Patient/family education;DME instruction    PT Goals (Current goals can be found in the Care Plan section)  Acute Rehab PT Goals Patient Stated Goal: to go  home PT Goal Formulation: With patient Time For Goal Achievement: 08/25/19 Potential to Achieve Goals: Good    Frequency Min 3X/week   Barriers to discharge        Co-evaluation               AM-PAC PT "6 Clicks" Mobility  Outcome Measure Help needed turning from your back to your side while in a flat bed without using bedrails?: None Help needed moving from lying on your back to sitting on the side of a flat bed without using bedrails?: None Help needed moving to and from a bed to a chair (including a wheelchair)?: A Little Help needed standing up from a chair using your arms (e.g., wheelchair or bedside chair)?: A Little Help needed to walk in hospital room?: A Little Help needed climbing 3-5 steps with a railing? : A Lot 6 Click Score: 19    End of Session   Activity Tolerance: Patient limited by fatigue Patient left: in bed;with call bell/phone within reach;with family/visitor present Nurse Communication: Mobility status PT Visit Diagnosis: Other abnormalities of gait and mobility (R26.89);Difficulty in walking, not elsewhere classified (R26.2)    Time: 1030-1051 PT Time Calculation (min) (ACUTE ONLY): 21 min   Charges:   PT Evaluation $PT Eval Moderate Complexity: 1 Mod          08/18/2019  Ginger Carne., PT Acute Rehabilitation Services [phone]  (pager) [phone]  (office)  Tessie Fass [place] 08/18/2019, 11:16 AM

## 2019-08-18 NOTE — Evaluation (Signed)
Occupational Therapy Evaluation Patient Details Name: Mary Chapman MRN: [id number] DOB: [birth year] Today's Date: 08/18/2019    History of Present Illness Mary Chapman is a 74 y.o. female with medical history significant of HTN, intrahepatic cholangiocarcinoma, ESRD on HD(M/W/F), ED and  pulmonary embolus 3/24 on Eliquis with oxygen as needed.  Presents with complaints of fever and confusion.    Clinical Impression   Pt is typically independent. Presents with significant weakness and unsteady gait requiring min (hand held assist) to ambulate. Pt fatigues quickly and needed to sit to wash her hands after toileting. Pt reported her stool being black as she came out of the bathroom, reported to MD. She is overall functioning at a min assist level in ADL with increased time. Pt and family are eager for palliative care consult prior to pt discharging. Will follow acutely. Pt will have 24 hour care at home of her family.    Follow Up Recommendations  Home health OT;Supervision/Assistance - 24 hour    Equipment Recommendations  3 in 1 bedside commode    Recommendations for Other Services       Precautions / Restrictions Precautions Precautions: Fall Precaution Comments: Watch O2 sats Restrictions Weight Bearing Restrictions: No      Mobility Bed Mobility Overal bed mobility: Needs Assistance Bed Mobility: Sit to Supine       Sit to supine: Supervision   General bed mobility comments: slow with struggle getting legs into bed, increased time  Transfers Overall transfer level: Needs assistance Equipment used: 1 person hand held assist Transfers: Sit to/from Stand Sit to Stand: Min assist         General transfer comment: Min A for steadying assist.     Balance                                           ADL either performed or assessed with clinical judgement   ADL Overall ADL's : Needs assistance/impaired Eating/Feeding: Independent   Grooming: Wash/dry  hands;Sitting;Standing;Min guard Grooming Details (indicate cue type and reason): pt leaning on sink, pulled BSC up behind her to rest Upper Body Bathing: Minimal assistance;Sitting   Lower Body Bathing: Minimal assistance;Sit to/from stand   Upper Body Dressing : Set up;Sitting   Lower Body Dressing: Minimal assistance;Sit to/from stand   Toilet Transfer: Minimal assistance;Ambulation   Toileting- Clothing Manipulation and Hygiene: Supervision/safety;Sit to/from stand       Functional mobility during ADLs: Minimal assistance       Vision Patient Visual Report: No change from baseline       Perception     Praxis      Pertinent Vitals/Pain Pain Assessment: Faces Faces Pain Scale: No hurt Pain Intervention(s): Monitored during session     Hand Dominance Right   Extremity/Trunk Assessment Upper Extremity Assessment Upper Extremity Assessment: Generalized weakness   Lower Extremity Assessment Lower Extremity Assessment: Defer to PT evaluation   Cervical / Trunk Assessment Cervical / Trunk Assessment: Normal   Communication Communication Communication: No difficulties   Cognition Arousal/Alertness: Awake/alert Behavior During Therapy: WFL for tasks assessed/performed Overall Cognitive Status: Within Functional Limits for tasks assessed                                 General Comments: pt fatigued at end of session and less responsive to questions  General Comments  sats on RA up in the higher 90's    Exercises     Shoulder Instructions      Home Living Family/patient expects to be discharged to:: Private residence Living Arrangements: Alone Available Help at Discharge: Family;Available 24 hours/day Type of Home: House Home Access: Stairs to enter [employer] of Steps: 3 Entrance Stairs-Rails: Right Home Layout: One level     Bathroom Shower/Tub: Teacher, early years/pre: Handicapped height     Home Equipment:  [place] - single point          Prior Functioning/Environment Level of Independence: Independent                 OT Problem List: Decreased strength;Decreased activity tolerance;Impaired balance (sitting and/or standing)      OT Treatment/Interventions: Self-care/ADL training;DME and/or AE instruction;Therapeutic activities;Patient/family education    OT Goals(Current goals can be found in the care plan section) Acute Rehab OT Goals Patient Stated Goal: to go home OT Goal Formulation: With patient Time For Goal Achievement: 09/01/19 Potential to Achieve Goals: Good ADL Goals Pt Will Perform Grooming: with supervision;standing(2 activities) Pt Will Perform Upper Body Bathing: with set-up;sitting Pt Will Perform Lower Body Bathing: with supervision;sit to/from stand Pt Will Perform Upper Body Dressing: with set-up;sitting Pt Will Perform Lower Body Dressing: with supervision;sit to/from stand Pt Will Transfer to Toilet: with supervision;ambulating;bedside commode Pt Will Perform Toileting - Clothing Manipulation and hygiene: with supervision;sit to/from stand  OT Frequency: Min 2X/week   Barriers to D/C:            Co-evaluation              AM-PAC OT "6 Clicks" Daily Activity     Outcome Measure Help from another person eating meals?: None Help from another person taking care of personal grooming?: A Little Help from another person toileting, which includes using toliet, bedpan, or urinal?: A Little Help from another person bathing (including washing, rinsing, drying)?: A Little Help from another person to put on and taking off regular upper body clothing?: None Help from another person to put on and taking off regular lower body clothing?: A Little 6 Click Score: 20   End of Session Nurse Communication: (aware pt reports black stool)  Activity Tolerance: Patient limited by fatigue Patient left: in bed;with call bell/phone within reach;with family/visitor  present  OT Visit Diagnosis: Unsteadiness on feet (R26.81);Other abnormalities of gait and mobility (R26.89);Muscle weakness (generalized) (M62.81)                Time: 1740-8144 OT Time Calculation (min): 25 min Charges:  OT General Charges $OT Visit: 1 Visit OT Evaluation $OT Eval Moderate Complexity: 1 Mod  {Manie Bealer, Haze Boyden 08/18/2019, 12:13 PM  Nestor Lewandowsky, OTR/L Acute Rehabilitation Services Pager: [phone] Office: [phone]

## 2019-08-18 NOTE — Plan of Care (Signed)

## 2019-08-18 NOTE — Progress Notes (Addendum)
PROGRESS NOTE        PATIENT DETAILS Name: Mary Chapman Age: 74 y.o. Sex: female Date of Birth: 02-15-1946 Admit Date: 08/14/2019 Admitting Physician Norval Morton, MD RDE:[id number], No Pcp Per  Brief Narrative: Patient is a 74 y.o. female with history of metastatic cholangiocarcinoma, ESRD on HD, HTN-who was diagnosed with pulmonary embolism during her most recent admission (from 3/24-3/27)-and sent home on Eliquis-who presented with confusion and fever.  Significant events: 3/24-3/27>> admit for hypoxemia due to Covid-discharged home on Eliquis. 3/28>> admit for fever and confusion  Antimicrobial therapy: Vancomycin: 3/28>> 3/31 Cefepime: 3/28>> 3/30  Microbiology data: 3/28 >>blood culture: Negative 3/29>> urine culture: Negative  Procedures : None  Consults: Nephrology  DVT Prophylaxis : Full dose anticoagulation with Eliquis  Subjective: Reportedly patient had a dark BM in the bathroom earlier this morning-this was not witnessed by RN.  Per RN patient is not able to walk to the bathroom-and she is really not sure whether patient had a dark BM.  Assessment/Plan: Fever: Etiology not apparent-no infective foci evident.  Blood cultures/urine cultures negative.  CT scan abdomen and chest x-ray without acute infection.  Thought to have either fever secondary to tumor or from recent VTE.  She is now afebrile-remains on Eliquis (dose increased to 5 mg this admission)   Acute metabolic encephalopathy: Currently awake-alert.  Nonfocal exam.  CT head without any acute findings.  Suspect she may have had transient encephalopathy in the setting of febrile illness.  ESRD on hemodialysis: Nephrology following and directing care.  Intrahepatic cholangiocarcinoma: Reviewed last oncology note on 3/26-limited additional treatments.  Per patient-her oncologist has given her a approximate life expectancy of 3 months.  Appears to have very poor overall prognosis.   I have placed palliative care evaluation-but suspect this can be done in the outpatient setting-have asked case management to set up outpatient palliative care follow-up.  RUQ pain is chronic-and currently well controlled with oxycodone.  ?  Melena: Not sure if patient had black stools today-see above documentation after my discussion with RN.  Repeat CBC tomorrow.  Already on PPI twice daily.  Anemia: Suspect secondary to chronic illness-underlying malignancy and ESRD.  1 unit of PRBC transfused on 3/30.  Hemoglobin stable this morning-we will repeat CBC tomorrow-see above.  HTN: BP controlled-continue amlodipine  Debility/deconditioning: Secondary to acute illness on top of multiple medical comorbidities and chronic debility.  Evaluated by PT/OT today-Per my discussion with OT-recommendations are to keep in the hospital for 1 additional day so that they can reassess patient with equipments tomorrow.  Goals of care discussion: Had discussed with patient and daughter yesterday-had asked them to start goals of care discussion amongst themselves.  Reached out to patient-and then to daughter this afternoon-she is agreeable with a DNR order.  Have placed a palliative care consultation for further delineation of goals of care-family/patient understands poor overall prognosis-especially with her cholangiocarcinoma.  Have also asked case management to arrange for outpatient palliative care evaluation as well.  Her RUQ pain is well controlled with oxycodone.  Diet: Diet Order            Diet renal with fluid restriction Fluid restriction: 1200 mL Fluid; Room service appropriate? Yes; Fluid consistency: Thin  Diet effective now               Code Status: Full code  Family  Communication: Daughter over the phone on 4/1  Disposition Plan: Home-likely with home health services on 4/1  Barriers to Discharge: Needs inpatoe   Antimicrobial agents: Anti-infectives (From admission, onward)   Start      Dose/Rate Route Frequency Ordered Stop   08/17/19 0957  vancomycin (VANCOCIN) 500-5 MG/100ML-% IVPB    Note to Pharmacy: Kennon Rounds   : cabinet override      08/17/19 0957 08/17/19 1002   08/16/19 1300  vancomycin (VANCOREADY) IVPB 500 mg/100 mL  Status:  Discontinued     500 mg 100 mL/hr over 60 Minutes Intravenous To Hemodialysis 08/16/19 0828 08/16/19 1900   08/15/19 2000  ceFEPIme (MAXIPIME) 1 g in sodium chloride 0.9 % 100 mL IVPB  Status:  Discontinued     1 g 200 mL/hr over 30 Minutes Intravenous Every 24 hours 08/14/19 2116 08/16/19 1454   08/15/19 1200  vancomycin (VANCOCIN) IVPB 500 mg/100 ml premix  Status:  Discontinued     500 mg 100 mL/hr over 60 Minutes Intravenous Every M-W-F (Hemodialysis) 08/14/19 2116 08/16/19 1454   08/14/19 1930  ceFEPIme (MAXIPIME) 2 g in sodium chloride 0.9 % 100 mL IVPB     2 g 200 mL/hr over 30 Minutes Intravenous  Once 08/14/19 1928 08/14/19 2120   08/14/19 1930  metroNIDAZOLE (FLAGYL) IVPB 500 mg     500 mg 100 mL/hr over 60 Minutes Intravenous  Once 08/14/19 1928 08/14/19 2212   08/14/19 1930  vancomycin (VANCOCIN) IVPB 1000 mg/200 mL premix     1,000 mg 200 mL/hr over 60 Minutes Intravenous  Once 08/14/19 1928 08/15/19 0804       Time spent: 25 minutes-Greater than 50% of this time was spent in counseling, explanation of diagnosis, planning of further management, and coordination of care.  MEDICATIONS: Scheduled Meds: . sodium chloride   Intravenous Once  . amLODipine  10 mg Oral QHS  . apixaban  5 mg Oral BID  . Chlorhexidine Gluconate Cloth  6 each Topical Daily  . dronabinol  2.5 mg Oral QAC lunch  . folic acid  1 mg Oral Daily  . metoprolol tartrate  50 mg Oral BID  . multivitamin  1 tablet Oral Once per day on Mon Wed Fri  . pantoprazole  40 mg Oral BID  . sodium chloride flush  10-40 mL Intracatheter Q12H  . sodium chloride flush  3 mL Intravenous Q12H   Continuous Infusions: . sodium chloride    . sodium chloride      PRN Meds:.sodium chloride, sodium chloride, acetaminophen **OR** acetaminophen, albuterol, alteplase, feeding supplement (NEPRO CARB STEADY), gabapentin, heparin, lidocaine (PF), lidocaine-prilocaine, ondansetron **OR** ondansetron (ZOFRAN) IV, oxyCODONE, pentafluoroprop-tetrafluoroeth, polyethylene glycol, senna-docusate, sodium chloride flush   PHYSICAL EXAM: Vital signs: Vitals:   08/17/19 2006 08/17/19 2052 08/18/19 0502 08/18/19 1131  BP: 121/69 127/72 126/67 128/72  Pulse: 86 86 70 78  Resp: 20 20 20  (!) 23  Temp: 100 F (37.8 C)  98.5 F (36.9 C) 99 F (37.2 C)  TempSrc: Oral  Oral Oral  SpO2: 94% 93% 91% 94%  Weight:   55.4 kg   Height:       Filed Weights   08/17/19 0655 08/17/19 1115 08/18/19 0502  Weight: 61.3 kg 59.4 kg 55.4 kg   Body mass index is 19.71 kg/m.   Gen Exam:Alert awake-not in any distress HEENT:atraumatic, normocephalic Chest: B/L clear to auscultation anteriorly CVS:S1S2 regular Abdomen: Soft-mild RUQ pain which is chronic. Extremities:no edema Neurology: Non focal Skin: no  rash I have personally reviewed following labs and imaging studies  LABORATORY DATA: CBC: Recent Labs  Lab 08/12/19 0350 08/12/19 0350 08/13/19 0336 08/14/19 1928 08/16/19 0343 08/16/19 1425 08/17/19 0405  WBC 7.4  --  8.2 11.4* 10.1  --  10.5  NEUTROABS  --   --   --  9.0*  --   --  8.2*  HGB 7.3*   < > 7.4* 7.3* 6.7* 7.7* 8.4*  HCT 25.2*   < > 26.4* 25.6* 22.5* 25.2* 27.6*  MCV 84.3  --  86.0 85.3 81.5  --  81.2  PLT 339  --  348 360 360  --  407*   < > = values in this interval not displayed.    Basic Metabolic Panel: Recent Labs  Lab 08/12/19 0350 08/14/19 1928 08/16/19 0343 08/17/19 0405  NA 137 135 133* 130*  K 3.7 4.8 5.0 4.7  CL 98 97* 97* 96*  CO2 29 26 25 22   GLUCOSE 116* 103* 91 96  BUN 18 31* 42* 49*  CREATININE 5.11* 5.99* 7.63* 8.37*  CALCIUM 7.5* 7.6* 7.8* 7.9*  PHOS  --   --   --  2.8    GFR: Estimated Creatinine Clearance:  5.2 mL/min (A) (by C-G formula based on SCr of 8.37 mg/dL (H)).  Liver Function Tests: Recent Labs  Lab 08/14/19 1928 08/17/19 0405  AST 20  --   ALT 8  --   ALKPHOS 145*  --   BILITOT 0.7  --   PROT 6.5  --   ALBUMIN 2.3* 2.0*   No results for input(s): LIPASE, AMYLASE in the last 168 hours. No results for input(s): AMMONIA in the last 168 hours.  Coagulation Profile: Recent Labs  Lab 08/12/19 0350 08/14/19 1928  INR 1.2 1.3*    Cardiac Enzymes: No results for input(s): CKTOTAL, CKMB, CKMBINDEX, TROPONINI in the last 168 hours.  BNP (last 3 results) No results for input(s): PROBNP in the last 8760 hours.  Lipid Profile: No results for input(s): CHOL, HDL, LDLCALC, TRIG, CHOLHDL, LDLDIRECT in the last 72 hours.  Thyroid Function Tests: No results for input(s): TSH, T4TOTAL, FREET4, T3FREE, THYROIDAB in the last 72 hours.  Anemia Panel: No results for input(s): VITAMINB12, FOLATE, FERRITIN, TIBC, IRON, RETICCTPCT in the last 72 hours.  Urine analysis:    Component Value Date/Time   COLORURINE YELLOW 08/15/2019 1251   APPEARANCEUR TURBID (A) 08/15/2019 1251   LABSPEC 1.015 08/15/2019 1251   PHURINE 7.5 08/15/2019 1251   GLUCOSEU NEGATIVE 08/15/2019 1251   HGBUR TRACE (A) 08/15/2019 1251   BILIRUBINUR NEGATIVE 08/15/2019 1251   KETONESUR NEGATIVE 08/15/2019 1251   PROTEINUR >300 (A) 08/15/2019 1251   NITRITE NEGATIVE 08/15/2019 1251   LEUKOCYTESUR NEGATIVE 08/15/2019 1251    Sepsis Labs: Lactic Acid, Venous    Component Value Date/Time   LATICACIDVEN 0.7 08/14/2019 1956    MICROBIOLOGY: Recent Results (from the past 240 hour(s))  Respiratory Panel by RT PCR (Flu A&B, Covid) - Nasopharyngeal Swab     Status: None   Collection Time: 08/10/19 11:02 AM   Specimen: Nasopharyngeal Swab  Result Value Ref Range Status   SARS Coronavirus 2 by RT PCR NEGATIVE NEGATIVE Final    Comment: (NOTE) SARS-CoV-2 target nucleic acids are NOT DETECTED. The SARS-CoV-2  RNA is generally detectable in upper respiratoy specimens during the acute phase of infection. The lowest concentration of SARS-CoV-2 viral copies this assay can detect is 131 copies/mL. A negative result does not preclude  SARS-Cov-2 infection and should not be used as the sole basis for treatment or other patient management decisions. A negative result may occur with  improper specimen collection/handling, submission of specimen other than nasopharyngeal swab, presence of viral mutation(s) within the areas targeted by this assay, and inadequate number of viral copies (<131 copies/mL). A negative result must be combined with clinical observations, patient history, and epidemiological information. The expected result is Negative. Fact Sheet for Patients:  PinkCheek.be Fact Sheet for Healthcare Providers:  GravelBags.it This test is not yet ap proved or cleared by the [place] FDA and  has been authorized for detection and/or diagnosis of SARS-CoV-2 by FDA under an Emergency Use Authorization (EUA). This EUA will remain  in effect (meaning this test can be used) for the duration of the COVID-19 declaration under Section 564(b)(1) of the Act, 21 U.S.C. section 360bbb-3(b)(1), unless the authorization is terminated or revoked sooner.    Influenza A by PCR NEGATIVE NEGATIVE Final   Influenza B by PCR NEGATIVE NEGATIVE Final    Comment: (NOTE) The Xpert Xpress SARS-CoV-2/FLU/RSV assay is intended as an aid in  the diagnosis of influenza from Nasopharyngeal swab specimens and  should not be used as a sole basis for treatment. Nasal washings and  aspirates are unacceptable for Xpert Xpress SARS-CoV-2/FLU/RSV  testing. Fact Sheet for Patients: PinkCheek.be Fact Sheet for Healthcare Providers: GravelBags.it This test is not yet approved or cleared by the [place] FDA  and  has been authorized for detection and/or diagnosis of SARS-CoV-2 by  FDA under an Emergency Use Authorization (EUA). This EUA will remain  in effect (meaning this test can be used) for the duration of the  Covid-19 declaration under Section 564(b)(1) of the Act, 21  U.S.C. section 360bbb-3(b)(1), unless the authorization is  terminated or revoked. Performed at [facility], [address] [place], [place] [address]   Blood Culture (routine x 2)     Status: None (Preliminary result)   Collection Time: 08/14/19  7:53 PM   Specimen: BLOOD  Result Value Ref Range Status   Specimen Description   Final    BLOOD RIGHT CHEST Performed at [facility], [address] [place], [place] [address]    Special Requests   Final    BOTTLES DRAWN AEROBIC AND ANAEROBIC Blood Culture adequate volume Performed at [facility], [address] [place], [place] [address]    Culture   Final    NO GROWTH 3 DAYS Performed at [facility], [place] [address], [place], [place] [address]    Report Status PENDING  Incomplete  Blood Culture (routine x 2)     Status: None (Preliminary result)   Collection Time: 08/14/19  7:59 PM   Specimen: BLOOD  Result Value Ref Range Status   Specimen Description   Final    BLOOD RIGHT ARM Performed at [facility], [address] [place], [place] [address]    Special Requests   Final    BOTTLES DRAWN AEROBIC AND ANAEROBIC Blood Culture adequate volume Performed at [facility], [address] [place], [place] [address]    Culture   Final    NO GROWTH 3 DAYS Performed at [facility], [place] [address], [place], [place] [address]    Report Status PENDING  Incomplete  SARS Coronavirus 2 Ag (30 min TAT) - Nasal Swab (BD Veritor Kit)  Status: None   Collection Time: 08/14/19 10:13 PM   Specimen: Nasal Swab (BD Veritor Kit)  Result Value Ref Range Status   SARS Coronavirus 2 Ag NEGATIVE NEGATIVE  Final    Comment: (NOTE) SARS-CoV-2 antigen NOT DETECTED.  Negative results are presumptive.  Negative results do not preclude SARS-CoV-2 infection and should not be used as the sole basis for treatment or other patient management decisions, including infection  control decisions, particularly in the presence of clinical signs and  symptoms consistent with COVID-19, or in those who have been in contact with the virus.  Negative results must be combined with clinical observations, patient history, and epidemiological information. The expected result is Negative. Fact Sheet for Patients: PodPark.tn Fact Sheet for Healthcare Providers: GiftContent.is This test is not yet approved or cleared by the [place] FDA and  has been authorized for detection and/or diagnosis of SARS-CoV-2 by FDA under an Emergency Use Authorization (EUA).  This EUA will remain in effect (meaning this test can be used) for the duration of  the COVID-19 de claration under Section 564(b)(1) of the Act, 21 U.S.C. section 360bbb-3(b)(1), unless the authorization is terminated or revoked sooner. Performed at [facility], [address] [place], [place] [address]   SARS CORONAVIRUS 2 (TAT 6-24 HRS) Nasopharyngeal Nasopharyngeal Swab     Status: None   Collection Time: 08/15/19  1:12 AM   Specimen: Nasopharyngeal Swab  Result Value Ref Range Status   SARS Coronavirus 2 NEGATIVE NEGATIVE Final    Comment: (NOTE) SARS-CoV-2 target nucleic acids are NOT DETECTED. The SARS-CoV-2 RNA is generally detectable in upper and lower respiratory specimens during the acute phase of infection. Negative results do not preclude SARS-CoV-2 infection, do not rule out co-infections with other pathogens, and should not be used as the sole basis for treatment or other patient management decisions. Negative results must be combined with clinical  observations, patient history, and epidemiological information. The expected result is Negative. Fact Sheet for Patients: SugarRoll.be Fact Sheet for Healthcare Providers: https://www.woods-mathews.com/ This test is not yet approved or cleared by the [place] FDA and  has been authorized for detection and/or diagnosis of SARS-CoV-2 by FDA under an Emergency Use Authorization (EUA). This EUA will remain  in effect (meaning this test can be used) for the duration of the COVID-19 declaration under Section 56 4(b)(1) of the Act, 21 U.S.C. section 360bbb-3(b)(1), unless the authorization is terminated or revoked sooner. Performed at [facility], [place] [address] [place], [place] [address]   Urine culture     Status: None   Collection Time: 08/15/19 12:51 PM   Specimen: In/Out Cath Urine  Result Value Ref Range Status   Specimen Description   Final    IN/OUT CATH URINE Performed at [facility], [address] [place], [place] [address]    Special Requests   Final    NONE Performed at [facility], [address] [place], [place] [address]    Culture   Final    NO GROWTH Performed at [facility], [place] [address] [place], [place] [address]    Report Status 08/16/2019 FINAL  Final    RADIOLOGY STUDIES/RESULTS: No results found.   LOS: 4 days   Oren Binet, MD  [facility]    To contact the attending provider between 7A-7P or the covering provider during after hours 7P-7A, please log into the web site [email or web address] and access using  universal [place] password for that web site. If you do not have the password, please call the hospital operator.  08/18/2019, 2:23 PM

## 2019-08-18 NOTE — Progress Notes (Signed)
   08/18/19 2000  MEWS Score  Temp 98.5 F (36.9 C)  BP 130/70  Pulse Rate 80  ECG Heart Rate 80  Resp (!) 21  Level of Consciousness Alert  SpO2 92 %  O2 Device Room Air  Patient Activity (if Appropriate) In bed  MEWS Score  MEWS Temp 0  MEWS Systolic 0  MEWS Pulse 0  MEWS RR 1  MEWS LOC 0  MEWS Score 1  MEWS Score Color Green  MEWS Assessment  Is this an acute change? No    No acute changes in status.

## 2019-08-18 NOTE — Progress Notes (Signed)
HEMATOLOGY-ONCOLOGY PROGRESS NOTE  SUBJECTIVE: .Pt is well-known to me, under my care for her cholangiocarcinoma. She was recently admitted for PE, and readmitted right after discharge for fever. She has been afebrile since admission, and may go home tomorrow.   Oncology History  Intrahepatic cholangiocarcinoma ([place])  02/23/2018 Imaging   CT AP W Contrast 02/23/18  IMPRESSION: 1. Large multi-cystic lesion in the central aspect of the liver adjacent to the gallbladder fossa, with several smaller satellite lesions. These are all new compared to prior CT the chest, abdomen and pelvis 08/07/2016, concerning for intrahepatic abscesses. 2. Extensive mural thickening and inflammatory changes in the region of the cecum. This is nonspecific, and could reflect either right-sided diverticulitis, focal area of colitis, or potentially even underlying neoplasm. 3. Cholelithiasis. Gallbladder is nearly completely contracted without surrounding inflammatory changes to suggest an acute cholecystitis at this time. 4. Left-sided nephrolithiasis measuring up to 8 mm in the upper pole collecting system of left kidney. No ureteral stones or findings of urinary tract obstruction. 5. Aortic atherosclerosis.   02/27/2018 Initial Biopsy   Diagnosis 02/27/18  Liver, needle/core biopsy, Right Hepatic Lobe - ADENOCARCINOMA. SEE NOTE.   02/27/2018 Miscellaneous     03/05/2018 Procedure   Colonoscopy 03/05/18  IMPRESSION - Preparation of the colon was fair. - Non-thrombosed external hemorrhoids found on digital rectal exam. - There was significant looping of the colon. - Severe diverticulosis in the recto-sigmoid colon, in the sigmoid colon, in the descending colon, in the transverse colon, at the hepatic flexure, in the ascending colon and in the cecum. There was no evidence of diverticular bleeding. - A single (solitary) ulcer in the cecum - highly concerning for underlying malignancy. Biopsied.  Phlegmonous change is possible, though often in setting of complicated diverticulosis/diverticulitis ulceration would not be as common. - Erythematous mucosa in the transverse colon, at the hepatic flexure, in the ascending colon and in the cecum. Biopsied. - Normal mucosa in the rectum, in the recto-sigmoid colon, in the sigmoid colon and in the descending colon. Biopsied. - Non-bleeding non-thrombosed external and internal hemorrhoids. -----Negative for malignancy    03/11/2018 Initial Diagnosis   Intrahepatic cholangiocarcinoma ([place])   04/02/2018 Imaging   CT CAP W contrast 04/02/18  IMPRESSION: 1. 4 mm subpleural nodule in the periphery of the right lower lobe. This is nonspecific, but strongly favored to represent a benign subpleural lymph node. Attention on follow-up studies is recommended to exclude the possibility of metastatic disease. 2. Diffuse bronchial wall thickening with moderate centrilobular and mild paraseptal emphysema; imaging findings suggestive of underlying COPD. 3. Aortic atherosclerosis, in addition to left main and left anterior descending coronary artery disease. Assessment for potential risk factor modification, dietary therapy or pharmacologic therapy may be warranted, if clinically indicated. 4. Nonobstructive calculi in the upper pole collecting system of left kidney measuring up to 7 mm.    04/27/2018 Cancer Staging   Staging form: Intrahepatic Bile Duct, AJCC 8th Edition - Clinical: Stage IIIB (cT2, cN1, cM0) - Signed by Truitt Merle, MD on 04/27/2018   05/18/2018 - 02/10/2019 Chemotherapy   Gemcitabine and Cisplatin every 2 weeks starting on 05/18/2018, with 50% dose reduction. D/c after 02/10/19 due to disease progression.     07/19/2018 Imaging   CT CAP W CONTRAST  IMPRESSION: 1. Dominant inferior liver mass is decreased in size. Two subcentimeter clustered low-attenuation lesions in the far inferior right liver lobe, not discretely visualized  on the prior noncontrast CT study, can not exclude new small satellite hepatic  metastases. 2. Porta hepatis adenopathy is mildly decreased. 3. Small right pulmonary nodules are stable. 4. No additional potential new or progressive metastatic disease. 5. Small pericardial effusion, slightly increased. 6. Cholelithiasis. 7. Moderate diffuse colonic diverticulosis. 8. Aortic Atherosclerosis (ICD10-I70.0) and Emphysema (ICD10-J43.9).    11/02/2018 Imaging   CT CAP WO contrast  IMPRESSION: 1. The dominant right hepatic lobe mass and most of the smaller right hepatic lobe lesions are stable. One of the right hepatic lobe lesions appears to have increased in size, formerly about 1.1 by 1.2 cm and currently 1.9 by 1.5 cm. 2. Two small right lower lobe pulmonary nodules are stable in size. 3. A hypodense right thyroid nodule has enlarged, currently 1.9 by 1.2 cm and formerly 1.1 by 0.7 cm. Consider thyroid ultrasound for further evaluation. 4. Other imaging findings of potential clinical significance: Aortic Atherosclerosis (ICD10-I70.0) and Emphysema (ICD10-J43.9). Coronary atherosclerosis. Mild cardiomegaly. Low-density blood pool suggests anemia. Nonobstructive left nephrolithiasis. Colonic diverticulosis.   02/22/2019 Imaging   CT CAP WO Contrast  IMPRESSION: 1. Interval increase in size of 2 now dominant hepatic lesions consistent with disease progression. Assessment of liver parenchyma limited by noncontrast study. 2. Stable right lower lobe pulmonary nodule. 2nd pulmonary nodule seen previously not identified today. 3. Stable 20 mm right thyroid nodule. 4.  Emphysema. (ICD10-J43.9) 5.  Aortic Atherosclerois (ICD10-170.0)   02/24/2019 - 04/21/2019 Chemotherapy   Second-line FOLFOX  with dose reduction starting 02/24/19. D/c due to disease progression in Liver. Last dose 04/21/19.    05/02/2019 Imaging   CT CAP WO Contrast IMPRESSION: 1. Continued mild progression of the dominant  central and smaller inferior right liver lesions. 2. Haziness and nodularity in the right omentum, adjacent to the inferior right liver is stable. 3. Tiny subpleural nodule right lower lobe is stable. 4. Stable 2 cm right thyroid nodule. Continued attention on follow-up recommended. 5.  Emphysema. (ICD10-J43.9) 6.  Aortic Atherosclerois (ICD10-170.0)   06/07/2019 -  Chemotherapy   Oral Lynparza 100mg  BID starting on 06/07/19   08/02/2019 Imaging   CT AP Wo contrast  IMPRESSION: 1. Interval progression of the large ill-defined hypoattenuating lesion in the central liver with progression of inferior right hepatic lesions. 2. Moderate volume ascites with mesenteric/omental edema and with diffuse body wall edema. 3. Stable 5 mm right lower lobe pulmonary nodule. 4. Nonobstructing left renal stones. 5. Small left groin hernia contains only fat. 6. Aortic Atherosclerosis (ICD10-I70.0).        REVIEW OF SYSTEMS:   Constitutional: Denies fevers, chills or abnormal weight loss Eyes: Denies blurriness of vision Ears, nose, mouth, throat, and face: Denies mucositis or sore throat Respiratory: Breathing remains stable. Cardiovascular: Denies palpitation, chest discomfort Gastrointestinal: Denies nausea and vomiting.  Has intermittent mild abdominal discomfort which is controlled with oral pain medication Skin: Denies abnormal skin rashes Lymphatics: Denies new lymphadenopathy or easy bruising Neurological:Denies numbness, tingling or new weaknesses Behavioral/Psych: Mood is stable, no new changes  Extremities: No lower extremity edema All other systems were reviewed with the patient and are negative.  I have reviewed the past medical history, past surgical history, social history and family history with the patient and they are unchanged from previous note.   PHYSICAL EXAMINATION: ECOG PERFORMANCE STATUS: 2 - Symptomatic, <50% confined to bed  Vitals:   08/18/19 0502 08/18/19 1131   BP: 126/67 128/72  Pulse: 70 78  Resp: 20 (!) 23  Temp: 98.5 F (36.9 C) 99 F (37.2 C)  SpO2: 91% 94%   Filed  Weights   08/17/19 0655 08/17/19 1115 08/18/19 0502  Weight: 135 lb 2.3 oz (61.3 kg) 130 lb 15.3 oz (59.4 kg) 122 lb 2.2 oz (55.4 kg)    Intake/Output from previous day: 03/31 0701 - 04/01 0700 In: 60 [P.O.:60] Out: 1500   GENERAL:alert, oriented, no distress and comfortable LUNGS: clear to auscultation and percussion with normal breathing effort HEART: regular rate & rhythm and no murmurs and no lower extremity edema ABDOMEN:  Positive bowel sounds, (+) tenderness over the right upper quadrant, mild distention Musculoskeletal:no cyanosis of digits and no clubbing  NEURO: alert & oriented x 3 with fluent speech, no focal motor/sensory deficits  LABORATORY DATA:  I have reviewed the data as listed CMP Latest Ref Rng & Units 08/17/2019 08/16/2019 08/14/2019  Glucose 70 - 99 mg/dL 96 91 103(H)  BUN 8 - 23 mg/dL 49(H) 42(H) 31(H)  Creatinine 0.44 - 1.00 mg/dL 8.37(H) 7.63(H) 5.99(H)  Sodium 135 - 145 mmol/L 130(L) 133(L) 135  Potassium 3.5 - 5.1 mmol/L 4.7 5.0 4.8  Chloride 98 - 111 mmol/L 96(L) 97(L) 97(L)  CO2 22 - 32 mmol/L 22 25 26   Calcium 8.9 - 10.3 mg/dL 7.9(L) 7.8(L) 7.6(L)  Total Protein 6.5 - 8.1 g/dL - - 6.5  Total Bilirubin 0.3 - 1.2 mg/dL - - 0.7  Alkaline Phos 38 - 126 U/L - - 145(H)  AST 15 - 41 U/L - - 20  ALT 0 - 44 U/L - - 8    Lab Results  Component Value Date   WBC 10.5 08/17/2019   HGB 8.4 (L) 08/17/2019   HCT 27.6 (L) 08/17/2019   MCV 81.2 08/17/2019   PLT 407 (H) 08/17/2019   NEUTROABS 8.2 (H) 08/17/2019    CT Abdomen Pelvis Wo Contrast  Result Date: 08/02/2019 CLINICAL DATA:  Intrahepatic cholangiocarcinoma diagnosed in 2019. Right-sided abdominal pain with nausea and vomiting. EXAM: CT ABDOMEN AND PELVIS WITHOUT CONTRAST TECHNIQUE: Multidetector CT imaging of the abdomen and pelvis was performed following the standard protocol  without IV contrast. COMPARISON:  05/02/2019 FINDINGS: Lower chest: 5 mm right lower lobe subpleural nodule on 16/6 is stable. Hepatobiliary: The large ill-defined hypoattenuating lesion in the central liver has progressed in the interval measuring 12.3 x 7.9 cm today compared to 8.6 x 5.8 cm previously. Lesion now largely replaces segment IV B. inferior right hepatic lesions are also progressive with 4.4 cm lesion on 38/2 increased from 2.6 cm previously. Gallbladder not visualized and presumed surgically absent. Periportal edema noted. No extrahepatic biliary duct dilatation. Pancreas: No focal mass lesion. No dilatation of the main duct. No intraparenchymal cyst. No peripancreatic edema. Spleen: No splenomegaly. No focal mass lesion. Adrenals/Urinary Tract: No adrenal nodule or mass. Right kidney unremarkable. Nonobstructing stones again noted left kidney. No hydroureter. Bladder decompressed. Stomach/Bowel: Stomach is unremarkable. No gastric wall thickening. No evidence of outlet obstruction. Duodenum is normally positioned as is the ligament of Treitz. No small bowel wall thickening. No small bowel dilatation. The terminal ileum is normal. The appendix is normal. Diverticuli are seen scattered along the entire length of the colon without CT findings of diverticulitis. Vascular/Lymphatic: There is abdominal aortic atherosclerosis without aneurysm. Probable small lymph nodes in the gastrohepatic ligament and hepatoduodenal ligament although assessment markedly limited due to lack of intravenous contrast material. No bulky retroperitoneal lymphadenopathy. No pelvic sidewall lymphadenopathy. Reproductive: The uterus is unremarkable.  There is no adnexal mass. Other: Moderate volume ascites with diffuse mesenteric edema. Musculoskeletal: Small left groin hernia contains only fat. Diffuse  body wall edema evident. No worrisome lytic or sclerotic osseous abnormality. IMPRESSION: 1. Interval progression of the large  ill-defined hypoattenuating lesion in the central liver with progression of inferior right hepatic lesions. 2. Moderate volume ascites with mesenteric/omental edema and with diffuse body wall edema. 3. Stable 5 mm right lower lobe pulmonary nodule. 4. Nonobstructing left renal stones. 5. Small left groin hernia contains only fat. 6. Aortic Atherosclerosis (ICD10-I70.0). Electronically Signed   By: Misty Stanley M.D.   On: 08/02/2019 13:43   CT Head Wo Contrast  Result Date: 08/14/2019 CLINICAL DATA:  Altered mental status, history of recent PE EXAM: CT HEAD WITHOUT CONTRAST TECHNIQUE: Contiguous axial images were obtained from the base of the skull through the vertex without intravenous contrast. COMPARISON:  12/17/2018 FINDINGS: Brain: No findings to suggest acute hemorrhage, acute infarction or space occupying mass lesion are noted. Mild atrophic changes commensurate with the patient's given age are noted. Basal ganglia calcifications are noted. Vascular: No hyperdense vessel or unexpected calcification. Skull: Normal. Negative for fracture or focal lesion. Sinuses/Orbits: No acute abnormality noted. Other: None. IMPRESSION: Mild atrophic changes commensurate with the patient's given age. No acute abnormality is identified. Electronically Signed   By: Inez Catalina M.D.   On: 08/14/2019 22:05   CT Angio Chest PE W and/or Wo Contrast  Result Date: 08/10/2019 CLINICAL DATA:  Shortness of breath. Near syncope. Dizziness. Elevated D-dimer. Intrahepatic cholangiocarcinoma. EXAM: CT ANGIOGRAPHY CHEST WITH CONTRAST TECHNIQUE: Multidetector CT imaging of the chest was performed using the standard protocol during bolus administration of intravenous contrast. Multiplanar CT image reconstructions and MIPs were obtained to evaluate the vascular anatomy. CONTRAST:  168mL OMNIPAQUE IOHEXOL 350 MG/ML SOLN COMPARISON:  Portable chest obtained earlier today. Chest, abdomen and pelvis CT dated 05/02/2019. FINDINGS:  Cardiovascular: Single small right lower lobe pulmonary arterial filling defect. Stable enlarged heart. A previously demonstrated small pericardial effusion is no longer seen. Mediastinum/Nodes: No enlarged lymph nodes. The previously demonstrated 2 cm right lobe thyroid nodule is not as well visualized, currently measuring approximately 1.7 cm in maximum diameter with a better defined 1.0 cm oval low density component. Lungs/Pleura: Small right posterior diaphragmatic defect with herniated fat at the posteromedial right lung base. Minimal bilateral dependent atelectasis. Mild bilateral upper lobe centrilobular bullous changes. No pleural fluid. Upper Abdomen: Multiple new liver masses of varying sizes. The previously demonstrated larger masses in the inferior right lobe of the liver are not currently included. Pronounced heterogeneity of the spleen compatible with early phase imaging. Small to moderate amount of free peritoneal fluid, increased. The included portion of the right kidney is small. Musculoskeletal: Thoracic and lower cervical spine degenerative changes. Review of the MIP images confirms the above findings. IMPRESSION: 1. Small right lower lobe pulmonary embolus without right heart strain. 2. Multiple new liver masses compatible with progressive metastatic disease 3. Mild changes of COPD with centrilobular emphysema in both upper lobes. 4. Stable right posterior diaphragmatic hernia containing herniated fat. 5. No interval increase in size of the previously demonstrated right lobe thyroid nodule. Critical Value/emergent results were called by telephone at the time of interpretation on 08/10/2019 at 10:40 am to provider [facility] , who verbally acknowledged these results. Emphysema (ICD10-J43.9). Electronically Signed   By: Claudie Revering M.D.   On: 08/10/2019 10:45   CT Abdomen Pelvis W Contrast  Result Date: 08/14/2019 CLINICAL DATA:  Worsening abdominal pain, history of cholangiocarcinoma  EXAM: CT ABDOMEN AND PELVIS WITH CONTRAST TECHNIQUE: Multidetector CT imaging of the abdomen  and pelvis was performed using the standard protocol following bolus administration of intravenous contrast. CONTRAST:  69mL OMNIPAQUE IOHEXOL 300 MG/ML  SOLN COMPARISON:  08/02/2019 FINDINGS: Lower chest: Lung bases demonstrate some mild atelectatic changes. Hepatobiliary: Gallbladder is not well seen. The known large cholangio carcinoma is again seen within the liver measuring at least 12 cm in greatest dimension. Scattered smaller hypodense lesions are noted throughout the liver consistent with local metastatic disease. These are stable in appearance from the prior exam as well. Pancreas: Unremarkable. No pancreatic ductal dilatation or surrounding inflammatory changes. Spleen: Normal in size without focal abnormality. Adrenals/Urinary Tract: Adrenal glands are within normal limits. Kidneys demonstrate a normal enhancement pattern bilaterally. Nonobstructing left renal stones are again seen and stable. No obstructive changes are seen. The bladder is partially distended. Stomach/Bowel: Scattered diverticular change of the colon is noted without evidence of diverticulitis. No obstructive or inflammatory changes are seen. The appendix is within normal limits. No obstructive changes of the small bowel are noted. Vascular/Lymphatic: Aortic atherosclerosis. No enlarged abdominal or pelvic lymph nodes. Reproductive: Uterus and bilateral adnexa are unremarkable. Other: Mild ascites is again identified and stable. Small fat containing left groin hernia is again seen. Musculoskeletal: No acute or significant osseous findings. IMPRESSION: Persistent large neoplastic lesion within the right lobe of the liver with multiple small metastatic lesions throughout the liver stable in appearance from the prior exam. This is consistent with the given clinical history. Diverticulosis without diverticulitis. Ascites stable in appearance from  the prior exam. No definitive peritoneal implants are seen. Nonobstructing left renal stones unchanged from the prior study. Mild bibasilar atelectatic changes. Previously seen right lower lobe pulmonary nodule is not as well appreciated on today's exam. Electronically Signed   By: Inez Catalina M.D.   On: 08/14/2019 22:17   DG Chest Port 1 View  Result Date: 08/14/2019 CLINICAL DATA:  Shortness of breath and fever EXAM: PORTABLE CHEST 1 VIEW COMPARISON:  August 12, 2019 FINDINGS: The right-sided Port-A-Cath is stable in position. The heart size is stable in size. There is no large focal infiltrate or large pleural effusion. The previously demonstrated bibasilar airspace opacities have improved. There is some mild atelectasis at the lung bases. There is no acute osseous abnormality. IMPRESSION: No active disease. Electronically Signed   By: Constance Holster M.D.   On: 08/14/2019 22:14   DG Chest Port 1 View  Result Date: 08/12/2019 CLINICAL DATA:  Fever EXAM: PORTABLE CHEST 1 VIEW COMPARISON:  08/10/2019 chest radiograph. FINDINGS: Right internal jugular Port-A-Cath terminates in the lower third of the SVC. Stable cardiomediastinal silhouette with top-normal heart size. No pneumothorax. No pleural effusion. Mild curvilinear bibasilar lung opacities. No pulmonary edema. IMPRESSION: Mild curvilinear bibasilar lung opacities, favor scarring or atelectasis. Electronically Signed   By: Ilona Sorrel M.D.   On: 08/12/2019 20:06   DG Chest Portable 1 View  Result Date: 08/10/2019 CLINICAL DATA:  Dizziness for 2 days.  Shortness of breath EXAM: PORTABLE CHEST 1 VIEW COMPARISON:  04/24/2019 FINDINGS: Mild cardiomegaly with aortic and hilar contours that are stable. Right port with tip in good position. There is no edema, consolidation, effusion, or pneumothorax. IMPRESSION: Stable exam.  No evidence of active disease. Electronically Signed   By: Monte Fantasia M.D.   On: 08/10/2019 07:19   ECHOCARDIOGRAM  COMPLETE  Result Date: 08/11/2019    ECHOCARDIOGRAM REPORT   Patient Name:   Mary Chapman Date of Exam: 08/11/2019 Medical Rec #:  [id number]   Height:  66.0 in Accession #:    [id number]  Weight:       126.8 lb Date of Birth:  15-Jul-1945  BSA:          1.647 m Patient Age:    17 years    BP:           137/71 mmHg Patient Gender: F           HR:           83 bpm. Exam Location:  Inpatient Procedure: 2D Echo, 3D Echo, Color Doppler, Cardiac Doppler and Strain Analysis Indications:    I26.02 Pulmonary embolus  History:        Patient has prior history of Echocardiogram examinations, most                 recent 08/08/2016. Risk Factors:Hypertension. Liver Cancer.  Sonographer:    Raquel Sarna Senior RDCS Referring Phys: [phone] [place]  1. Normal LV systolic function; mild LVH; grade 1 diastolic dysfunction; moderate LAE.  2. Left ventricular ejection fraction, by estimation, is 50 to 55%. The left ventricle has low normal function. The left ventricle has no regional wall motion abnormalities. There is mild left ventricular hypertrophy. Left ventricular diastolic parameters are consistent with Grade I diastolic dysfunction (impaired relaxation).  3. Right ventricular systolic function is normal. The right ventricular size is normal.  4. Left atrial size was moderately dilated.  5. The mitral valve is normal in structure. Trivial mitral valve regurgitation. No evidence of mitral stenosis.  6. The aortic valve is normal in structure. Aortic valve regurgitation is not visualized. No aortic stenosis is present.  7. The inferior vena cava is normal in size with greater than 50% respiratory variability, suggesting right atrial pressure of 3 mmHg. FINDINGS  Left Ventricle: Left ventricular ejection fraction, by estimation, is 50 to 55%. The left ventricle has low normal function. The left ventricle has no regional wall motion abnormalities. The left ventricular internal cavity size was normal in size. There  is mild left ventricular hypertrophy. Left ventricular diastolic parameters are consistent with Grade I diastolic dysfunction (impaired relaxation). Right Ventricle: The right ventricular size is normal.Right ventricular systolic function is normal. Left Atrium: Left atrial size was moderately dilated. Right Atrium: Right atrial size was normal in size. Pericardium: Trivial pericardial effusion is present. Mitral Valve: The mitral valve is normal in structure. Normal mobility of the mitral valve leaflets. Trivial mitral valve regurgitation. No evidence of mitral valve stenosis. Tricuspid Valve: The tricuspid valve is normal in structure. Tricuspid valve regurgitation is trivial. No evidence of tricuspid stenosis. Aortic Valve: The aortic valve is normal in structure. Aortic valve regurgitation is not visualized. No aortic stenosis is present. Pulmonic Valve: The pulmonic valve was normal in structure. Pulmonic valve regurgitation is not visualized. No evidence of pulmonic stenosis. Aorta: The aortic root is normal in size and structure. Venous: The inferior vena cava is normal in size with greater than 50% respiratory variability, suggesting right atrial pressure of 3 mmHg. IAS/Shunts: No atrial level shunt detected by color flow Doppler. Additional Comments: Normal LV systolic function; mild LVH; grade 1 diastolic dysfunction; moderate LAE.  LEFT VENTRICLE PLAX 2D LVIDd:         4.70 cm      Diastology LVIDs:         3.70 cm      LV e' lateral:   5.77 cm/s LV PW:         1.30 cm  LV E/e' lateral: 8.9 LV IVS:        0.90 cm      LV e' medial:    5.98 cm/s LVOT diam:     1.90 cm      LV E/e' medial:  8.6 LV SV:         51 LV SV Index:   31 LVOT Area:     2.84 cm  LV Volumes (MOD) LV vol d, MOD A2C: 95.8 ml LV vol d, MOD A4C: 118.0 ml LV vol s, MOD A2C: 52.3 ml LV vol s, MOD A4C: 65.3 ml LV SV MOD A2C:     43.5 ml LV SV MOD A4C:     118.0 ml LV SV MOD BP:      46.1 ml RIGHT VENTRICLE RV S prime:     11.90 cm/s  TAPSE (M-mode): 2.3 cm LEFT ATRIUM             Index       RIGHT ATRIUM           Index LA diam:        2.70 cm 1.64 cm/m  RA Area:     19.00 cm LA Vol (A2C):   81.1 ml 49.23 ml/m RA Volume:   53.30 ml  32.35 ml/m LA Vol (A4C):   65.0 ml 39.45 ml/m LA Biplane Vol: 72.9 ml 44.25 ml/m  AORTIC VALVE LVOT Vmax:   101.00 cm/s LVOT Vmean:  70.700 cm/s LVOT VTI:    0.180 m  AORTA Ao Root diam: 2.70 cm Ao Asc diam:  3.20 cm MITRAL VALVE MV Area (PHT): 2.56 cm    SHUNTS MV Decel Time: 296 msec    Systemic VTI:  0.18 m MV E velocity: 51.40 cm/s  Systemic Diam: 1.90 cm MV A velocity: 92.00 cm/s MV E/A ratio:  0.56 Kirk Ruths MD Electronically signed by Kirk Ruths MD Signature Date/Time: 08/11/2019/11:46:37 AM    Final    VAS [place] LOWER EXTREMITY VENOUS (DVT)  Result Date: 08/11/2019  Lower Venous DVTStudy Indications: Pulmonary embolism.  Comparison Study: no prior Performing Technologist: Abram Sander RVS  Examination Guidelines: A complete evaluation includes B-mode imaging, spectral Doppler, color Doppler, and power Doppler as needed of all accessible portions of each vessel. Bilateral testing is considered an integral part of a complete examination. Limited examinations for reoccurring indications may be performed as noted. The reflux portion of the exam is performed with the patient in reverse Trendelenburg.  +---------+---------------+---------+-----------+----------+--------------+ RIGHT    CompressibilityPhasicitySpontaneityPropertiesThrombus Aging +---------+---------------+---------+-----------+----------+--------------+ CFV      Full           Yes      Yes                                 +---------+---------------+---------+-----------+----------+--------------+ SFJ      Full                                                        +---------+---------------+---------+-----------+----------+--------------+ FV Prox  Full                                                         +---------+---------------+---------+-----------+----------+--------------+  FV Mid   Full                                                        +---------+---------------+---------+-----------+----------+--------------+ FV DistalFull                                                        +---------+---------------+---------+-----------+----------+--------------+ PFV      Full                                                        +---------+---------------+---------+-----------+----------+--------------+ POP      Full           Yes      Yes                                 +---------+---------------+---------+-----------+----------+--------------+ PTV      Full                                                        +---------+---------------+---------+-----------+----------+--------------+ PERO     Full                                                        +---------+---------------+---------+-----------+----------+--------------+   +---------+---------------+---------+-----------+----------+--------------+ LEFT     CompressibilityPhasicitySpontaneityPropertiesThrombus Aging +---------+---------------+---------+-----------+----------+--------------+ CFV      Full           Yes      Yes                                 +---------+---------------+---------+-----------+----------+--------------+ SFJ      Full                                                        +---------+---------------+---------+-----------+----------+--------------+ FV Prox  Full                                                        +---------+---------------+---------+-----------+----------+--------------+ FV Mid   Full                                                        +---------+---------------+---------+-----------+----------+--------------+  FV DistalFull                                                         +---------+---------------+---------+-----------+----------+--------------+ PFV      Full                                                        +---------+---------------+---------+-----------+----------+--------------+ POP      Full           Yes      Yes                                 +---------+---------------+---------+-----------+----------+--------------+ PTV      Full                                                        +---------+---------------+---------+-----------+----------+--------------+ PERO     Full                                                        +---------+---------------+---------+-----------+----------+--------------+     Summary: BILATERAL: - No evidence of deep vein thrombosis seen in the lower extremities, bilaterally.   *See table(s) above for measurements and observations. Electronically signed by Servando Snare MD on 08/11/2019 at 4:42:37 PM.    Final     ASSESSMENT AND PLAN: 1. Fever, negative ID workup 2.  Acute metabolic encephalopathy, resolved  3. intrahepatic cholangiocarcinoma 4.  Pulmonary embolism 5.  Abdominal pain secondary to #1, controlled 6.  End-stage renal disease 7.  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  Hypertension 7.  Deconditioning   Recommendation -pt is clearly declining, she is not a candidate for more chemo, which we decided on last hospital admission -she has agreed with DNR -she will go home with [facility] home care and palliative care service from [place], this has been set up for her -we again discussed hospice care. She agrees if her fatigue and general condition get worse, and when it becomes difficult for her to get HD, she will stop HD. She understands her life expediency is likely less than a month, and she will agree with hospice service at some point.  -will continue supportive care -if she experience more GI bleeding, I would suggest her to decrease Eliquis dose   Truitt Merle  08/18/2019

## 2019-08-18 NOTE — Discharge Instructions (Addendum)

## 2019-08-18 NOTE — TOC Initial Note (Signed)
Transition of Care [facility]) - Initial/Assessment Note    Patient Details  Name: Mary Chapman MRN: [id number] Date of Birth: 08-05-1945  Transition [facility]) CM/SW Contact:    Maryclare Labrador, RN Phone Number: 08/18/2019, 3:21 PM  Clinical Narrative:     Pt very recently discharged home with [facility] and oxygen from [employer].   Pt is also ESRD on outpt HD.   [facility] aware of admit and will resume services post discharge. Pt has liver CA and [facility] palliative had been consulted.  CM received verbal order from attending to set up pt with [facility] the [facility] program. CM discussed referral with pt and she chose [facility] contacted and referral accepted by Nunzio Cory.                Expected Discharge Plan: [place]     Patient Goals and CMS Choice Patient states their goals for this hospitalization and ongoing recovery are:: Pt states she is ready to get back home CMS Medicare.gov Compare Post Acute Care list provided to:: Patient Choice offered to / list presented to : Patient  Expected Discharge Plan and Services Expected Discharge Plan: [place]     Post Acute Care Choice: (outpt Palliative [facility])                             HH Arranged: PT HH Agency: [place] Date [facility] Agency Contacted: 08/18/19 Time HH Agency Contacted: 41 Representative spoke with at [place]: [place] Arrangements/Services   Lives with:: Adult Children Patient language and need for interpreter reviewed:: Yes Do you feel safe going back to the place where you live?: Yes      Need for Family Participation in Patient Care: Yes (Comment) Care giver support system in place?: Yes (comment) Current home services: Home PT Criminal Activity/Legal Involvement Pertinent to Current Situation/Hospitalization: No - Comment as needed  Activities of Daily Living Home Assistive Devices/Equipment: None ADL Screening  (condition at time of admission) Patient's cognitive ability adequate to safely complete daily activities?: Yes Is the patient deaf or have difficulty hearing?: No Does the patient have difficulty seeing, even when wearing glasses/contacts?: No Does the patient have difficulty concentrating, remembering, or making decisions?: No Patient able to express need for assistance with ADLs?: Yes Does the patient have difficulty dressing or bathing?: No Independently performs ADLs?: Yes (appropriate for developmental age) Does the patient have difficulty walking or climbing stairs?: No Weakness of Legs: None Weakness of Arms/Hands: None  Permission Sought/Granted   Permission granted to share information with : Yes, Verbal Permission Granted              Emotional Assessment   Attitude/Demeanor/Rapport: Charismatic, Engaged, Gracious Affect (typically observed): Accepting, Adaptable Orientation: : Oriented to Self, Oriented to Place, Oriented to  Time, Oriented to Situation   Psych Involvement: No (comment)  Admission diagnosis:  Fever of unknown origin [R50.9] Sepsis without acute organ dysfunction, due to unspecified organism [facility]) [A41.9] Patient Active Problem List   Diagnosis Date Noted  . Fever of unknown origin 08/14/2019  . Anemia due to end stage renal disease ([place]) 08/11/2019  . Acute respiratory failure ([place]) 08/11/2019  . Pulmonary embolism ([place]) 08/10/2019  . GERD (gastroesophageal reflux disease) 08/10/2019  . Hypokalemia 08/10/2019  . Pneumonia 04/25/2019  . DNR (do not resuscitate) 04/24/2019  . ESRD (end  stage renal disease) ([place]) 04/23/2019  . Community acquired pneumonia of right lower lobe of lung   . Genetic testing 11/30/2018  . Port-A-Cath in place 05/18/2018  . Cough   . Cholangiocarcinoma ([place])   . Sepsis ([place]) 04/09/2018  . Intrahepatic cholangiocarcinoma ([place]) 03/11/2018  . Mass of cecum with bleeding 03/04/2018  . GI bleed 03/01/2018  .  Gastrointestinal hemorrhage associated with anorectal source   . Mass of right lobe of liver   . Diverticulitis 02/23/2018  . ESRD on dialysis ([place]) 02/23/2018  . Hypophosphatemia 03/17/2017  . Hyperphosphatemia 03/17/2017  . Elevated troponin 03/16/2017  . Hyperlipidemia 03/16/2017  . SOB (shortness of breath) 03/16/2017  . Focal segmental glomerulosclerosis 03/15/2017  . Nephrotic syndrome 08/20/2016  . Essential hypertension 08/08/2016  . Dyspnea 08/08/2016  . Tobacco abuse   . Anasarca 08/07/2016   PCP:  Patient, No Pcp Per Pharmacy:   [id number] [place], [place] - [address] [place] [address] Phone: [phone] Fax: [phone]  [place], [place] - [address] [place] [address] Phone: [phone] Fax: [place], [place] - [place] [address] Phone: [phone] Fax: [phone]  [facility] [place], [place] - [address] [place] [address] Phone: [phone] Fax: [phone]     Social Determinants of Health (SDOH) Interventions    Readmission Risk Interventions Readmission Risk Prevention Plan 08/17/2019  Transportation Screening Complete  Medication Review (RN Care Manager) Complete  HRI or [place] (No Data)  Some recent data might be hidden

## 2019-08-19 ENCOUNTER — Telehealth: Payer: Self-pay | Admitting: Hematology

## 2019-08-19 LAB — RENAL FUNCTION PANEL
Albumin: 1.9 g/dL — ABNORMAL LOW (ref 3.5–5.0)
Anion gap: 14 (ref 5–15)
BUN: 29 mg/dL — ABNORMAL HIGH (ref 8–23)
CO2: 27 mmol/L (ref 22–32)
Calcium: 8.2 mg/dL — ABNORMAL LOW (ref 8.9–10.3)
Chloride: 95 mmol/L — ABNORMAL LOW (ref 98–111)
Creatinine, Ser: 6.8 mg/dL — ABNORMAL HIGH (ref 0.44–1.00)
GFR calc Af Amer: 6 mL/min — ABNORMAL LOW (ref >60)
GFR calc non Af Amer: 6 mL/min — ABNORMAL LOW (ref >60)
Glucose, Bld: 97 mg/dL (ref 70–99)
Phosphorus: 2.7 mg/dL (ref 2.5–4.6)
Potassium: 4.2 mmol/L (ref 3.5–5.1)
Sodium: 136 mmol/L (ref 135–145)

## 2019-08-19 LAB — CBC
HCT: 30.1 % — ABNORMAL LOW (ref 36.0–46.0)
Hemoglobin: 8.5 g/dL — ABNORMAL LOW (ref 12.0–15.0)
MCH: 24.1 pg — ABNORMAL LOW (ref 26.0–34.0)
MCHC: 28.2 g/dL — ABNORMAL LOW (ref 30.0–36.0)
MCV: 85.3 fL (ref 80.0–100.0)
Platelets: 467 10*3/uL — ABNORMAL HIGH (ref 150–400)
RBC: 3.53 MIL/uL — ABNORMAL LOW (ref 3.87–5.11)
RDW: 20.7 % — ABNORMAL HIGH (ref 11.5–15.5)
WBC: 9.5 10*3/uL (ref 4.0–10.5)
nRBC: 0 % (ref 0.0–0.2)

## 2019-08-19 LAB — MRSA PCR SCREENING: MRSA by PCR: NEGATIVE

## 2019-08-19 MED ORDER — HEPARIN SODIUM (PORCINE) 1000 UNIT/ML DIALYSIS: 1000.0000 [IU] | INTRAMUSCULAR | Status: DC | PRN

## 2019-08-19 MED ORDER — APIXABAN 5 MG PO TABS: 5.0000 mg | ORAL_TABLET | Freq: Two times a day (BID) | ORAL | 0 refills | Status: AC

## 2019-08-19 MED ORDER — HEPARIN SODIUM (PORCINE) 1000 UNIT/ML DIALYSIS: 20.0000 [IU]/kg | INTRAMUSCULAR | Status: DC | PRN

## 2019-08-19 MED ORDER — SODIUM CHLORIDE 0.9 % IV SOLN: 100.0000 mL | INTRAVENOUS | Status: DC | PRN

## 2019-08-19 NOTE — Discharge Summary (Signed)
PATIENT DETAILS Name: Meleana Commerford Age: 74 y.o. Sex: female Date of Birth: 09/08/1945 MRN: [id number]. Admitting Physician: Norval Morton, MD AJG:[id number], No Pcp Per  Admit Date: 08/14/2019 Discharge date: 08/19/2019  Recommendations for Outpatient Follow-up:  Continue goals of care discussion in the outpatient setting She continues to decline-consider stopping anticoagulation, at some point will need to stop hemodialysis as well DNR in place  Admitted From:  Home  Disposition: Home with palliative care follow-up   Home Health: Yes  Equipment/Devices: None  Discharge Condition: Stable  CODE STATUS:  DNR  Diet recommendation:  Diet Order            Diet - low sodium heart healthy        Diet renal with fluid restriction Fluid restriction: 1200 mL Fluid; Room service appropriate? Yes; Fluid consistency: Thin  Diet effective now               Brief Narrative: Patient is a 74 y.o. female with history of metastatic cholangiocarcinoma, ESRD on HD, HTN-who was diagnosed with pulmonary embolism during her most recent admission (from 3/24-3/27)-and sent home on Eliquis-who presented with confusion and fever.  Thought to have noninfectious cause of fever-likely from underlying malignancy and or venous thromboembolism.  Managed with supportive care-afebrile for the past several days-extensive discussion with patient/family by this MD-she is now DNR-family aware of poor overall prognosis.  Plans are to discharge home with palliative care services-see below.  Significant events: 3/24-3/27>> admit for hypoxemia due to Covid-discharged home on Eliquis. 3/28>> admit for fever and confusion  Antimicrobial therapy: Vancomycin: 3/28>> 3/31 Cefepime: 3/28>> 3/30  Microbiology data: 3/28 >>blood culture: Negative 3/29>> urine culture: Negative  Procedures : None  Consults: Nephrology Oncology  Brief Hospital Course: Fever: Etiology not apparent-no infective foci  evident.  Blood cultures/urine cultures negative.  CT scan abdomen and chest x-ray without acute infection.  Thought to have either fever secondary to tumor or from recent VTE.  She is now afebrile-remains on Eliquis (dose increased to 5 mg this admission)   Acute metabolic encephalopathy: Currently awake-alert.  Nonfocal exam.  CT head without any acute findings.  Suspect she may have had transient encephalopathy in the setting of febrile illness.  ESRD on hemodialysis: Nephrology following and directing care.  Intrahepatic cholangiocarcinoma: Reviewed last oncology note on 3/26-limited additional treatments.  Per oncology-life expectancy is less than 1 month.  Very poor prognosis.  Extensive discussion with patient and daughter by this MD-she is now a DNR.  Family/patient aware that patient will likely continue to decline-and at some point in the near future-we will need to be transition to full comfort measures.  Also will need to stop anticoagulation and dialysis at some point.  Case management/social work has set up outpatient palliative care follow-up.  She continues to have RUQ pain that is well controlled with oxycodone.  ?  Melena:  Question if patient had black stools on 4/1-no further BM overnight.  Hemoglobin has actually improved.  Doubt if patient actually had melena yesterday.    Anemia: Suspect secondary to chronic illness-underlying malignancy and ESRD.  1 unit of PRBC transfused on 3/30.    No indication of blood loss-see above  HTN: BP controlled-continue amlodipine  Debility/deconditioning: Secondary to acute illness on top of multiple medical comorbidities and chronic debility.   Goals of care: See above-now a DNR.  Both patient family aware of poor overall prognosis-and the likelihood that she will continue to decline over the next few days/weeks.  Per oncology her life expectancy is less than a month.  Encourage family to continue goals of care discussion with outpatient  palliative care.  Both patient and family aware that at some point she will need to stop hemodialysis and also stop anticoagulation.  Continue oxycodone (patient has supply at home) for RUQ pain.   Discharge Diagnoses:  Principal Problem:   Fever of unknown origin Active Problems:   ESRD on dialysis ([place])   Intrahepatic cholangiocarcinoma ([place])   Pulmonary embolism ([facility])   GERD (gastroesophageal reflux disease)   Anemia due to end stage renal disease [facility])   Discharge Instructions:  Activity:  As tolerated with Full fall precautions use walker/cane & assistance as needed   Discharge Instructions    Diet - low sodium heart healthy   Complete by: As directed    Discharge instructions   Complete by: As directed    Follow with Primary MD   in 1-2 weeks  Please get a complete blood count and chemistry panel checked by your Primary MD at your next visit, and again as instructed by your Primary MD.  Get Medicines reviewed and adjusted: Please take all your medications with you for your next visit with your Primary MD  Laboratory/radiological data: Please request your Primary MD to go over all hospital tests and procedure/radiological results at the follow up, please ask your Primary MD to get all Hospital records sent to his/her office.  In some cases, they will be blood work, cultures and biopsy results pending at the time of your discharge. Please request that your primary care M.D. follows up on these results.  Also Note the following: If you experience worsening of your admission symptoms, develop shortness of breath, life threatening emergency, suicidal or homicidal thoughts you must seek medical attention immediately by calling 911 or calling your MD immediately  if symptoms less severe.  You must read complete instructions/literature along with all the possible adverse reactions/side effects for all the Medicines you take and that have been prescribed to you. Take any new  Medicines after you have completely understood and accpet all the possible adverse reactions/side effects.   Do not drive when taking Pain medications or sleeping medications (Benzodaizepines)  Do not take more than prescribed Pain, Sleep and Anxiety Medications. It is not advisable to combine anxiety,sleep and pain medications without talking with your primary care practitioner  Special Instructions: If you have smoked or chewed Tobacco  in the last 2 yrs please stop smoking, stop any regular Alcohol  and or any Recreational drug use.  Wear Seat belts while driving.  Please note: You were cared for by a hospitalist during your hospital stay. Once you are discharged, your primary care physician will handle any further medical issues. Please note that NO REFILLS for any discharge medications will be authorized once you are discharged, as it is imperative that you return to your primary care physician (or establish a relationship with a primary care physician if you do not have one) for your post hospital discharge needs so that they can reassess your need for medications and monitor your lab values.   Increase activity slowly   Complete by: As directed      Allergies as of 08/19/2019   No Known Allergies     Medication List    STOP taking these medications   folic acid 1 MG tablet Commonly known as: FOLVITE     TAKE these medications   amLODipine 10 MG tablet Commonly known as:  NORVASC Take 10 mg by mouth at bedtime.   apixaban 5 MG Tabs tablet Commonly known as: ELIQUIS Take 1 tablet (5 mg total) by mouth 2 (two) times daily. What changed:   medication strength  how much to take   dronabinol 2.5 MG capsule Commonly known as: MARINOL Take 1 capsule (2.5 mg total) by mouth daily before lunch.   feeding supplement (NEPRO CARB STEADY) Liqd Take 237 mLs by mouth 3 (three) times daily as needed (Supplement).   gabapentin 100 MG capsule Commonly known as: NEURONTIN Take 1  capsule (100 mg total) by mouth at bedtime as needed (pain).   multivitamin Tabs tablet Take 1 tablet by mouth 3 (three) times a week. What changed: additional instructions   oxycodone 5 MG capsule Commonly known as: OXY-IR Take 1-2 capsules (5-10 mg total) by mouth every 8 (eight) hours as needed. What changed: reasons to take this   pantoprazole 40 MG tablet Commonly known as: PROTONIX TAKE 1 TABLET BY MOUTH TWICE A DAY   polyethylene glycol 17 g packet Commonly known as: MiraLax Take 17 g by mouth daily. What changed:   when to take this  reasons to take this   senna-docusate 8.6-50 MG tablet Commonly known as: Senokot-S Take 2 tablets by mouth 2 (two) times daily. What changed:   when to take this  reasons to take this      Follow-up Information    [name], MD. Schedule an appointment as soon as possible for a visit in 1 week(s).   Specialty: Cardiology Contact information: [address] [place] [address] [phone]        Truitt Merle, MD. Schedule an appointment as soon as possible for a visit in 2 week(s).   Specialties: Hematology, Oncology Contact information: [place] [address] [phone]          No Known Allergies   Other Procedures/Studies: CT Abdomen Pelvis Wo Contrast  Result Date: 08/02/2019 CLINICAL DATA:  Intrahepatic cholangiocarcinoma diagnosed in 2019. Right-sided abdominal pain with nausea and vomiting. EXAM: CT ABDOMEN AND PELVIS WITHOUT CONTRAST TECHNIQUE: Multidetector CT imaging of the abdomen and pelvis was performed following the standard protocol without IV contrast. COMPARISON:  05/02/2019 FINDINGS: Lower chest: 5 mm right lower lobe subpleural nodule on 16/6 is stable. Hepatobiliary: The large ill-defined hypoattenuating lesion in the central liver has progressed in the interval measuring 12.3 x 7.9 cm today compared to 8.6 x 5.8 cm previously. Lesion now largely  replaces segment IV B. inferior right hepatic lesions are also progressive with 4.4 cm lesion on 38/2 increased from 2.6 cm previously. Gallbladder not visualized and presumed surgically absent. Periportal edema noted. No extrahepatic biliary duct dilatation. Pancreas: No focal mass lesion. No dilatation of the main duct. No intraparenchymal cyst. No peripancreatic edema. Spleen: No splenomegaly. No focal mass lesion. Adrenals/Urinary Tract: No adrenal nodule or mass. Right kidney unremarkable. Nonobstructing stones again noted left kidney. No hydroureter. Bladder decompressed. Stomach/Bowel: Stomach is unremarkable. No gastric wall thickening. No evidence of outlet obstruction. Duodenum is normally positioned as is the ligament of Treitz. No small bowel wall thickening. No small bowel dilatation. The terminal ileum is normal. The appendix is normal. Diverticuli are seen scattered along the entire length of the colon without CT findings of diverticulitis. Vascular/Lymphatic: There is abdominal aortic atherosclerosis without aneurysm. Probable small lymph nodes in the gastrohepatic ligament and hepatoduodenal ligament although assessment markedly limited due to lack of intravenous contrast material. No  bulky retroperitoneal lymphadenopathy. No pelvic sidewall lymphadenopathy. Reproductive: The uterus is unremarkable.  There is no adnexal mass. Other: Moderate volume ascites with diffuse mesenteric edema. Musculoskeletal: Small left groin hernia contains only fat. Diffuse body wall edema evident. No worrisome lytic or sclerotic osseous abnormality. IMPRESSION: 1. Interval progression of the large ill-defined hypoattenuating lesion in the central liver with progression of inferior right hepatic lesions. 2. Moderate volume ascites with mesenteric/omental edema and with diffuse body wall edema. 3. Stable 5 mm right lower lobe pulmonary nodule. 4. Nonobstructing left renal stones. 5. Small left groin hernia contains only  fat. 6. Aortic Atherosclerosis (ICD10-I70.0). Electronically Signed   By: Misty Stanley M.D.   On: 08/02/2019 13:43   CT Head Wo Contrast  Result Date: 08/14/2019 CLINICAL DATA:  Altered mental status, history of recent PE EXAM: CT HEAD WITHOUT CONTRAST TECHNIQUE: Contiguous axial images were obtained from the base of the skull through the vertex without intravenous contrast. COMPARISON:  12/17/2018 FINDINGS: Brain: No findings to suggest acute hemorrhage, acute infarction or space occupying mass lesion are noted. Mild atrophic changes commensurate with the patient's given age are noted. Basal ganglia calcifications are noted. Vascular: No hyperdense vessel or unexpected calcification. Skull: Normal. Negative for fracture or focal lesion. Sinuses/Orbits: No acute abnormality noted. Other: None. IMPRESSION: Mild atrophic changes commensurate with the patient's given age. No acute abnormality is identified. Electronically Signed   By: Inez Catalina M.D.   On: 08/14/2019 22:05   CT Angio Chest PE W and/or Wo Contrast  Result Date: 08/10/2019 CLINICAL DATA:  Shortness of breath. Near syncope. Dizziness. Elevated D-dimer. Intrahepatic cholangiocarcinoma. EXAM: CT ANGIOGRAPHY CHEST WITH CONTRAST TECHNIQUE: Multidetector CT imaging of the chest was performed using the standard protocol during bolus administration of intravenous contrast. Multiplanar CT image reconstructions and MIPs were obtained to evaluate the vascular anatomy. CONTRAST:  150m OMNIPAQUE IOHEXOL 350 MG/ML SOLN COMPARISON:  Portable chest obtained earlier today. Chest, abdomen and pelvis CT dated 05/02/2019. FINDINGS: Cardiovascular: Single small right lower lobe pulmonary arterial filling defect. Stable enlarged heart. A previously demonstrated small pericardial effusion is no longer seen. Mediastinum/Nodes: No enlarged lymph nodes. The previously demonstrated 2 cm right lobe thyroid nodule is not as well visualized, currently measuring  approximately 1.7 cm in maximum diameter with a better defined 1.0 cm oval low density component. Lungs/Pleura: Small right posterior diaphragmatic defect with herniated fat at the posteromedial right lung base. Minimal bilateral dependent atelectasis. Mild bilateral upper lobe centrilobular bullous changes. No pleural fluid. Upper Abdomen: Multiple new liver masses of varying sizes. The previously demonstrated larger masses in the inferior right lobe of the liver are not currently included. Pronounced heterogeneity of the spleen compatible with early phase imaging. Small to moderate amount of free peritoneal fluid, increased. The included portion of the right kidney is small. Musculoskeletal: Thoracic and lower cervical spine degenerative changes. Review of the MIP images confirms the above findings. IMPRESSION: 1. Small right lower lobe pulmonary embolus without right heart strain. 2. Multiple new liver masses compatible with progressive metastatic disease 3. Mild changes of COPD with centrilobular emphysema in both upper lobes. 4. Stable right posterior diaphragmatic hernia containing herniated fat. 5. No interval increase in size of the previously demonstrated right lobe thyroid nodule. Critical Value/emergent results were called by telephone at the time of interpretation on 08/10/2019 at 10:40 am [facility] verbally acknowledged these results. Emphysema (ICD10-J43.9). Electronically Signed   By: SClaudie ReveringM.D.   On: 08/10/2019 10:45  CT Abdomen Pelvis W Contrast  Result Date: 08/14/2019 CLINICAL DATA:  Worsening abdominal pain, history of cholangiocarcinoma EXAM: CT ABDOMEN AND PELVIS WITH CONTRAST TECHNIQUE: Multidetector CT imaging of the abdomen and pelvis was performed using the standard protocol following bolus administration of intravenous contrast. CONTRAST:  33m OMNIPAQUE IOHEXOL 300 MG/ML  SOLN COMPARISON:  08/02/2019 FINDINGS: Lower chest: Lung bases demonstrate some  mild atelectatic changes. Hepatobiliary: Gallbladder is not well seen. The known large cholangio carcinoma is again seen within the liver measuring at least 12 cm in greatest dimension. Scattered smaller hypodense lesions are noted throughout the liver consistent with local metastatic disease. These are stable in appearance from the prior exam as well. Pancreas: Unremarkable. No pancreatic ductal dilatation or surrounding inflammatory changes. Spleen: Normal in size without focal abnormality. Adrenals/Urinary Tract: Adrenal glands are within normal limits. Kidneys demonstrate a normal enhancement pattern bilaterally. Nonobstructing left renal stones are again seen and stable. No obstructive changes are seen. The bladder is partially distended. Stomach/Bowel: Scattered diverticular change of the colon is noted without evidence of diverticulitis. No obstructive or inflammatory changes are seen. The appendix is within normal limits. No obstructive changes of the small bowel are noted. Vascular/Lymphatic: Aortic atherosclerosis. No enlarged abdominal or pelvic lymph nodes. Reproductive: Uterus and bilateral adnexa are unremarkable. Other: Mild ascites is again identified and stable. Small fat containing left groin hernia is again seen. Musculoskeletal: No acute or significant osseous findings. IMPRESSION: Persistent large neoplastic lesion within the right lobe of the liver with multiple small metastatic lesions throughout the liver stable in appearance from the prior exam. This is consistent with the given clinical history. Diverticulosis without diverticulitis. Ascites stable in appearance from the prior exam. No definitive peritoneal implants are seen. Nonobstructing left renal stones unchanged from the prior study. Mild bibasilar atelectatic changes. Previously seen right lower lobe pulmonary nodule is not as well appreciated on today's exam. Electronically Signed   By: MInez CatalinaM.D.   On: 08/14/2019 22:17    DG Chest Port 1 View  Result Date: 08/14/2019 CLINICAL DATA:  Shortness of breath and fever EXAM: PORTABLE CHEST 1 VIEW COMPARISON:  August 12, 2019 FINDINGS: The right-sided Port-A-Cath is stable in position. The heart size is stable in size. There is no large focal infiltrate or large pleural effusion. The previously demonstrated bibasilar airspace opacities have improved. There is some mild atelectasis at the lung bases. There is no acute osseous abnormality. IMPRESSION: No active disease. Electronically Signed   By: CConstance HolsterM.D.   On: 08/14/2019 22:14   DG Chest Port 1 View  Result Date: 08/12/2019 CLINICAL DATA:  Fever EXAM: PORTABLE CHEST 1 VIEW COMPARISON:  08/10/2019 chest radiograph. FINDINGS: Right internal jugular Port-A-Cath terminates in the lower third of the SVC. Stable cardiomediastinal silhouette with top-normal heart size. No pneumothorax. No pleural effusion. Mild curvilinear bibasilar lung opacities. No pulmonary edema. IMPRESSION: Mild curvilinear bibasilar lung opacities, favor scarring or atelectasis. Electronically Signed   By: JIlona SorrelM.D.   On: 08/12/2019 20:06   DG Chest Portable 1 View  Result Date: 08/10/2019 CLINICAL DATA:  Dizziness for 2 days.  Shortness of breath EXAM: PORTABLE CHEST 1 VIEW COMPARISON:  04/24/2019 FINDINGS: Mild cardiomegaly with aortic and hilar contours that are stable. Right port with tip in good position. There is no edema, consolidation, effusion, or pneumothorax. IMPRESSION: Stable exam.  No evidence of active disease. Electronically Signed   By: JMonte FantasiaM.D.   On: 08/10/2019 07:19   ECHOCARDIOGRAM  COMPLETE  Result Date: 08/11/2019    ECHOCARDIOGRAM REPORT   Patient Name:   KHANIYA TENAGLIA Date of Exam: 08/11/2019 Medical Rec #:  [id number]   Height:       66.0 in Accession #:    [id number]  Weight:       126.8 lb Date of Birth:  Oct 16, 1945  BSA:          1.647 m Patient Age:    89 years    BP:           137/71 mmHg Patient  Gender: F           HR:           83 bpm. Exam Location:  Inpatient Procedure: 2D Echo, 3D Echo, Color Doppler, Cardiac Doppler and Strain Analysis Indications:    I26.02 Pulmonary embolus  History:        Patient has prior history of Echocardiogram examinations, most                 recent 08/08/2016. Risk Factors:Hypertension. Liver Cancer.  Sonographer:    Raquel Sarna Senior RDCS Referring Phys: [phone] [place]  1. Normal LV systolic function; mild LVH; grade 1 diastolic dysfunction; moderate LAE.  2. Left ventricular ejection fraction, by estimation, is 50 to 55%. The left ventricle has low normal function. The left ventricle has no regional wall motion abnormalities. There is mild left ventricular hypertrophy. Left ventricular diastolic parameters are consistent with Grade I diastolic dysfunction (impaired relaxation).  3. Right ventricular systolic function is normal. The right ventricular size is normal.  4. Left atrial size was moderately dilated.  5. The mitral valve is normal in structure. Trivial mitral valve regurgitation. No evidence of mitral stenosis.  6. The aortic valve is normal in structure. Aortic valve regurgitation is not visualized. No aortic stenosis is present.  7. The inferior vena cava is normal in size with greater than 50% respiratory variability, suggesting right atrial pressure of 3 mmHg. FINDINGS  Left Ventricle: Left ventricular ejection fraction, by estimation, is 50 to 55%. The left ventricle has low normal function. The left ventricle has no regional wall motion abnormalities. The left ventricular internal cavity size was normal in size. There is mild left ventricular hypertrophy. Left ventricular diastolic parameters are consistent with Grade I diastolic dysfunction (impaired relaxation). Right Ventricle: The right ventricular size is normal.Right ventricular systolic function is normal. Left Atrium: Left atrial size was moderately dilated. Right Atrium: Right atrial  size was normal in size. Pericardium: Trivial pericardial effusion is present. Mitral Valve: The mitral valve is normal in structure. Normal mobility of the mitral valve leaflets. Trivial mitral valve regurgitation. No evidence of mitral valve stenosis. Tricuspid Valve: The tricuspid valve is normal in structure. Tricuspid valve regurgitation is trivial. No evidence of tricuspid stenosis. Aortic Valve: The aortic valve is normal in structure. Aortic valve regurgitation is not visualized. No aortic stenosis is present. Pulmonic Valve: The pulmonic valve was normal in structure. Pulmonic valve regurgitation is not visualized. No evidence of pulmonic stenosis. Aorta: The aortic root is normal in size and structure. Venous: The inferior vena cava is normal in size with greater than 50% respiratory variability, suggesting right atrial pressure of 3 mmHg. IAS/Shunts: No atrial level shunt detected by color flow Doppler. Additional Comments: Normal LV systolic function; mild LVH; grade 1 diastolic dysfunction; moderate LAE.  LEFT VENTRICLE PLAX 2D LVIDd:         4.70 cm  Diastology LVIDs:         3.70 cm      LV e' lateral:   5.77 cm/s LV PW:         1.30 cm      LV E/e' lateral: 8.9 LV IVS:        0.90 cm      LV e' medial:    5.98 cm/s LVOT diam:     1.90 cm      LV E/e' medial:  8.6 LV SV:         51 LV SV Index:   31 LVOT Area:     2.84 cm  LV Volumes (MOD) LV vol d, MOD A2C: 95.8 ml LV vol d, MOD A4C: 118.0 ml LV vol s, MOD A2C: 52.3 ml LV vol s, MOD A4C: 65.3 ml LV SV MOD A2C:     43.5 ml LV SV MOD A4C:     118.0 ml LV SV MOD BP:      46.1 ml RIGHT VENTRICLE RV S prime:     11.90 cm/s TAPSE (M-mode): 2.3 cm LEFT ATRIUM             Index       RIGHT ATRIUM           Index LA diam:        2.70 cm 1.64 cm/m  RA Area:     19.00 cm LA Vol (A2C):   81.1 ml 49.23 ml/m RA Volume:   53.30 ml  32.35 ml/m LA Vol (A4C):   65.0 ml 39.45 ml/m LA Biplane Vol: 72.9 ml 44.25 ml/m  AORTIC VALVE LVOT Vmax:   101.00 cm/s LVOT  Vmean:  70.700 cm/s LVOT VTI:    0.180 m  AORTA Ao Root diam: 2.70 cm Ao Asc diam:  3.20 cm MITRAL VALVE MV Area (PHT): 2.56 cm    SHUNTS MV Decel Time: 296 msec    Systemic VTI:  0.18 m MV E velocity: 51.40 cm/s  Systemic Diam: 1.90 cm MV A velocity: 92.00 cm/s MV E/A ratio:  0.56 Kirk Ruths MD Electronically signed by Kirk Ruths MD Signature Date/Time: 08/11/2019/11:46:37 AM    Final    VAS [place] LOWER EXTREMITY VENOUS (DVT)  Result Date: 08/11/2019  Lower Venous DVTStudy Indications: Pulmonary embolism.  Comparison Study: no prior Performing Technologist: Abram Sander RVS  Examination Guidelines: A complete evaluation includes B-mode imaging, spectral Doppler, color Doppler, and power Doppler as needed of all accessible portions of each vessel. Bilateral testing is considered an integral part of a complete examination. Limited examinations for reoccurring indications may be performed as noted. The reflux portion of the exam is performed with the patient in reverse Trendelenburg.  +---------+---------------+---------+-----------+----------+--------------+ RIGHT    CompressibilityPhasicitySpontaneityPropertiesThrombus Aging +---------+---------------+---------+-----------+----------+--------------+ CFV      Full           Yes      Yes                                 +---------+---------------+---------+-----------+----------+--------------+ SFJ      Full                                                        +---------+---------------+---------+-----------+----------+--------------+ FV Prox  Full                                                        +---------+---------------+---------+-----------+----------+--------------+  FV Mid   Full                                                        +---------+---------------+---------+-----------+----------+--------------+ FV DistalFull                                                         +---------+---------------+---------+-----------+----------+--------------+ PFV      Full                                                        +---------+---------------+---------+-----------+----------+--------------+ POP      Full           Yes      Yes                                 +---------+---------------+---------+-----------+----------+--------------+ PTV      Full                                                        +---------+---------------+---------+-----------+----------+--------------+ PERO     Full                                                        +---------+---------------+---------+-----------+----------+--------------+   +---------+---------------+---------+-----------+----------+--------------+ LEFT     CompressibilityPhasicitySpontaneityPropertiesThrombus Aging +---------+---------------+---------+-----------+----------+--------------+ CFV      Full           Yes      Yes                                 +---------+---------------+---------+-----------+----------+--------------+ SFJ      Full                                                        +---------+---------------+---------+-----------+----------+--------------+ FV Prox  Full                                                        +---------+---------------+---------+-----------+----------+--------------+ FV Mid   Full                                                        +---------+---------------+---------+-----------+----------+--------------+  FV DistalFull                                                        +---------+---------------+---------+-----------+----------+--------------+ PFV      Full                                                        +---------+---------------+---------+-----------+----------+--------------+ POP      Full           Yes      Yes                                  +---------+---------------+---------+-----------+----------+--------------+ PTV      Full                                                        +---------+---------------+---------+-----------+----------+--------------+ PERO     Full                                                        +---------+---------------+---------+-----------+----------+--------------+     Summary: BILATERAL: - No evidence of deep vein thrombosis seen in the lower extremities, bilaterally.   *See table(s) above for measurements and observations. Electronically signed by Servando Snare MD on 08/11/2019 at 4:42:37 PM.    Final      TODAY-DAY OF DISCHARGE:  Subjective:   [name] today has no headache,no chest abdominal pain,no new weakness tingling or numbness, feels much better wants to go home today.  Objective:   Blood pressure 116/67, pulse 78, temperature 98.2 F (36.8 C), temperature source Oral, resp. rate 18, height '5\' 6"'$  (1.676 m), weight 58.9 kg, SpO2 94 %.  Intake/Output Summary (Last 24 hours) at 08/19/2019 1214 Last data filed at 08/19/2019 0546 Gross per 24 hour  Intake 296 ml  Output 2 ml  Net 294 ml   Filed Weights   08/17/19 1115 08/18/19 0502 08/19/19 0700  Weight: 59.4 kg 55.4 kg 58.9 kg    Exam: Awake Alert, Oriented *3, No new F.N deficits, Normal affect [place].AT,PERRAL Supple Neck,No JVD, No cervical lymphadenopathy appriciated.  Symmetrical Chest wall movement, Good air movement bilaterally, CTAB RRR,No Gallops,Rubs or new Murmurs, No Parasternal Heave +ve B.Sounds, Abd Soft, Non tender, No organomegaly appriciated, No rebound -guarding or rigidity. No Cyanosis, Clubbing or edema, No new Rash or bruise   PERTINENT RADIOLOGIC STUDIES: No results found.   PERTINENT LAB RESULTS: CBC: Recent Labs    08/17/19 0405 08/19/19 0319  WBC 10.5 9.5  HGB 8.4* 8.5*  HCT 27.6* 30.1*  PLT 407* 467*   CMET CMP     Component Value Date/Time   NA 136 08/19/2019 0319   K 4.2  08/19/2019 0319   CL 95 (L) 08/19/2019 0319   CO2  27 08/19/2019 0319   GLUCOSE 97 08/19/2019 0319   BUN 29 (H) 08/19/2019 0319   CREATININE 6.80 (H) 08/19/2019 0319   CREATININE 3.05 (HH) 07/13/2019 1235   CALCIUM 8.2 (L) 08/19/2019 0319   PROT 6.5 08/14/2019 1928   ALBUMIN 1.9 (L) 08/19/2019 0319   AST 20 08/14/2019 1928   AST 13 (L) 07/13/2019 1235   ALT 8 08/14/2019 1928   ALT <6 07/13/2019 1235   ALKPHOS 145 (H) 08/14/2019 1928   BILITOT 0.7 08/14/2019 1928   BILITOT 0.6 07/13/2019 1235   GFRNONAA 6 (L) 08/19/2019 0319   GFRNONAA 15 (L) 07/13/2019 1235   GFRAA 6 (L) 08/19/2019 0319   GFRAA 17 (L) 07/13/2019 1235    GFR Estimated Creatinine Clearance: 6.9 mL/min (A) (by C-G formula based on SCr of 6.8 mg/dL (H)). No results for input(s): LIPASE, AMYLASE in the last 72 hours. No results for input(s): CKTOTAL, CKMB, CKMBINDEX, TROPONINI in the last 72 hours. Invalid input(s): POCBNP No results for input(s): DDIMER in the last 72 hours. No results for input(s): HGBA1C in the last 72 hours. No results for input(s): CHOL, HDL, LDLCALC, TRIG, CHOLHDL, LDLDIRECT in the last 72 hours. No results for input(s): TSH, T4TOTAL, T3FREE, THYROIDAB in the last 72 hours.  Invalid input(s): FREET3 No results for input(s): VITAMINB12, FOLATE, FERRITIN, TIBC, IRON, RETICCTPCT in the last 72 hours. Coags: No results for input(s): INR in the last 72 hours.  Invalid input(s): PT Microbiology: Recent Results (from the past 240 hour(s))  Respiratory Panel by RT PCR (Flu A&B, Covid) - Nasopharyngeal Swab     Status: None   Collection Time: 08/10/19 11:02 AM   Specimen: Nasopharyngeal Swab  Result Value Ref Range Status   SARS Coronavirus 2 by RT PCR NEGATIVE NEGATIVE Final    Comment: (NOTE) SARS-CoV-2 target nucleic acids are NOT DETECTED. The SARS-CoV-2 RNA is generally detectable in upper respiratoy specimens during the acute phase of infection. The lowest concentration of SARS-CoV-2  viral copies this assay can detect is 131 copies/mL. A negative result does not preclude SARS-Cov-2 infection and should not be used as the sole basis for treatment or other patient management decisions. A negative result may occur with  improper specimen collection/handling, submission of specimen other than nasopharyngeal swab, presence of viral mutation(s) within the areas targeted by this assay, and inadequate number of viral copies (<131 copies/mL). A negative result must be combined with clinical observations, patient history, and epidemiological information. The expected result is Negative. Fact Sheet for Patients:  PinkCheek.be Fact Sheet for Healthcare Providers:  GravelBags.it This test is not yet ap proved or cleared by the [place] FDA and  has been authorized for detection and/or diagnosis of SARS-CoV-2 by FDA under an Emergency Use Authorization (EUA). This EUA will remain  in effect (meaning this test can be used) for the duration of the COVID-19 declaration under Section 564(b)(1) of the Act, 21 U.S.C. section 360bbb-3(b)(1), unless the authorization is terminated or revoked sooner.    Influenza A by PCR NEGATIVE NEGATIVE Final   Influenza B by PCR NEGATIVE NEGATIVE Final    Comment: (NOTE) The Xpert Xpress SARS-CoV-2/FLU/RSV assay is intended as an aid in  the diagnosis of influenza from Nasopharyngeal swab specimens and  should not be used as a sole basis for treatment. Nasal washings and  aspirates are unacceptable for Xpert Xpress SARS-CoV-2/FLU/RSV  testing. Fact Sheet for Patients: PinkCheek.be Fact Sheet for Healthcare Providers: GravelBags.it This test is not yet approved or cleared  by the [place] and  has been authorized for detection and/or diagnosis of SARS-CoV-2 by  FDA under an Emergency Use Authorization (EUA). This EUA will  remain  in effect (meaning this test can be used) for the duration of the  Covid-19 declaration under Section 564(b)(1) of the Act, 21  U.S.C. section 360bbb-3(b)(1), unless the authorization is  terminated or revoked. Performed at [facility], [address] [place], [place] [address]   Blood Culture (routine x 2)     Status: None (Preliminary result)   Collection Time: 08/14/19  7:53 PM   Specimen: BLOOD  Result Value Ref Range Status   Specimen Description   Final    BLOOD RIGHT CHEST Performed at [facility], [address] [place], [place] [address]    Special Requests   Final    BOTTLES DRAWN AEROBIC AND ANAEROBIC Blood Culture adequate volume Performed at [facility], [address] [place], [place] [address]    Culture   Final    NO GROWTH 3 DAYS Performed at [facility], [place] [address] [place], [place] [address]    Report Status PENDING  Incomplete  Blood Culture (routine x 2)     Status: None (Preliminary result)   Collection Time: 08/14/19  7:59 PM   Specimen: BLOOD  Result Value Ref Range Status   Specimen Description   Final    BLOOD RIGHT ARM Performed at [facility], [address] [place], [place] [address]    Special Requests   Final    BOTTLES DRAWN AEROBIC AND ANAEROBIC Blood Culture adequate volume Performed at [facility], [address] [place], [place] [address]    Culture   Final    NO GROWTH 3 DAYS Performed at [facility], [place] [address], [place], [place] [address]    Report Status PENDING  Incomplete  SARS Coronavirus 2 Ag (30 min TAT) - Nasal Swab (BD Veritor Kit)     Status: None   Collection Time: 08/14/19 10:13 PM   Specimen: Nasal Swab (BD Veritor Kit)  Result Value Ref Range Status   SARS Coronavirus 2 Ag NEGATIVE NEGATIVE Final    Comment: (NOTE) SARS-CoV-2 antigen NOT DETECTED.  Negative results are presumptive.  Negative results do not  preclude SARS-CoV-2 infection and should not be used as the sole basis for treatment or other patient management decisions, including infection  control decisions, particularly in the presence of clinical signs and  symptoms consistent with COVID-19, or in those who have been in contact with the virus.  Negative results must be combined with clinical observations, patient history, and epidemiological information. The expected result is Negative. Fact Sheet for Patients: PodPark.tn Fact Sheet for Healthcare Providers: GiftContent.is This test is not yet approved or cleared by the [place] FDA and  has been authorized for detection and/or diagnosis of SARS-CoV-2 by FDA under an Emergency Use Authorization (EUA).  This EUA will remain in effect (meaning this test can be used) for the duration of  the COVID-19 de claration under Section 564(b)(1) of the Act, 21 U.S.C. section 360bbb-3(b)(1), unless the authorization is terminated or revoked sooner. Performed at [facility], [address] [place], [place] [address]   SARS CORONAVIRUS 2 (TAT 6-24 HRS) Nasopharyngeal Nasopharyngeal Swab     Status: None   Collection Time: 08/15/19  1:12 AM   Specimen: Nasopharyngeal  Swab  Result Value Ref Range Status   SARS Coronavirus 2 NEGATIVE NEGATIVE Final    Comment: (NOTE) SARS-CoV-2 target nucleic acids are NOT DETECTED. The SARS-CoV-2 RNA is generally detectable in upper and lower respiratory specimens during the acute phase of infection. Negative results do not preclude SARS-CoV-2 infection, do not rule out co-infections with other pathogens, and should not be used as the sole basis for treatment or other patient management decisions. Negative results must be combined with clinical observations, patient history, and epidemiological information. The expected result is Negative. Fact Sheet for  Patients: SugarRoll.be Fact Sheet for Healthcare Providers: https://www.woods-mathews.com/ This test is not yet approved or cleared by the [place] FDA and  has been authorized for detection and/or diagnosis of SARS-CoV-2 by FDA under an Emergency Use Authorization (EUA). This EUA will remain  in effect (meaning this test can be used) for the duration of the COVID-19 declaration under Section 56 4(b)(1) of the Act, 21 U.S.C. section 360bbb-3(b)(1), unless the authorization is terminated or revoked sooner. Performed at [facility], [place] [address] [place], [place] [address]   Urine culture     Status: None   Collection Time: 08/15/19 12:51 PM   Specimen: In/Out Cath Urine  Result Value Ref Range Status   Specimen Description   Final    IN/OUT CATH URINE Performed at [facility], [address] [place], [place] [address]    Special Requests   Final    NONE Performed at [facility], [address] [place], [place] [address]    Culture   Final    NO GROWTH Performed at [facility], [place] [address] [place], [place] [address]    Report Status 08/16/2019 FINAL  Final  MRSA PCR Screening     Status: None   Collection Time: 08/19/19 12:45 AM   Specimen: Nasal Mucosa; Nasopharyngeal  Result Value Ref Range Status   MRSA by PCR NEGATIVE NEGATIVE Final    Comment:        The GeneXpert MRSA Assay (FDA approved for NASAL specimens only), is one component of a comprehensive MRSA colonization surveillance program. It is not intended to diagnose MRSA infection nor to guide or monitor treatment for MRSA infections. Performed at [facility], [place] [address] [place], [place] [address]     FURTHER DISCHARGE INSTRUCTIONS:  Get Medicines reviewed and adjusted: Please take all your medications with you for your next visit with your Primary MD  Laboratory/radiological data: Please request your  Primary MD to go over all hospital tests and procedure/radiological results at the follow up, please ask your Primary MD to get all Hospital records sent to his/her office.  In some cases, they will be blood work, cultures and biopsy results pending at the time of your discharge. Please request that your primary care M.D. goes through all the records of your hospital data and follows up on these results.  Also Note the following: If you experience worsening of your admission symptoms, develop shortness of breath, life threatening emergency, suicidal or homicidal thoughts you must seek medical attention immediately by calling 911 or calling your MD immediately  if symptoms less severe.  You must read complete instructions/literature along with all the possible adverse reactions/side effects for all the Medicines you take and that have been prescribed to you. Take any new Medicines after you have completely understood and accpet all the possible adverse reactions/side effects.   Do  not drive when taking Pain medications or sleeping medications (Benzodaizepines)  Do not take more than prescribed Pain, Sleep and Anxiety Medications. It is not advisable to combine anxiety,sleep and pain medications without talking with your primary care practitioner  Special Instructions: If you have smoked or chewed Tobacco  in the last 2 yrs please stop smoking, stop any regular Alcohol  and or any Recreational drug use.  Wear Seat belts while driving.  Please note: You were cared for by a hospitalist during your hospital stay. Once you are discharged, your primary care physician will handle any further medical issues. Please note that NO REFILLS for any discharge medications will be authorized once you are discharged, as it is imperative that you return to your primary care physician (or establish a relationship with a primary care physician if you do not have one) for your post hospital discharge needs so that they  can reassess your need for medications and monitor your lab values.  Total Time spent coordinating discharge including counseling, education and face to face time equals 35 minutes.  SignedOren Binet 08/19/2019 12:14 PM

## 2019-08-19 NOTE — Progress Notes (Signed)
PT Cancellation Note  Patient Details Name: Mary Chapman MRN: [id number] DOB: 03-28-1946   Cancelled Treatment:    Reason Eval/Treat Not Completed: Other (comment)(Patient declined due to fatigue)  Patient just finished working with OT. Patient declined PT session. Dtr present and noted she will be there all the time or will arrange for other family members to be present. Patient and dtr declined having any concerns about mobility for discharge and had no questions for PT.  [name] 08/19/2019, 12:38 PM

## 2019-08-19 NOTE — Progress Notes (Signed)
[name] to be D/C'd Home per MD order. Discussed with the patient and all questions fully answered.  Allergies as of 08/19/2019   No Known Allergies     Medication List    STOP taking these medications   folic acid 1 MG tablet Commonly known as: FOLVITE     TAKE these medications   amLODipine 10 MG tablet Commonly known as: NORVASC Take 10 mg by mouth at bedtime.   apixaban 5 MG Tabs tablet Commonly known as: ELIQUIS Take 1 tablet (5 mg total) by mouth 2 (two) times daily. What changed:   medication strength  how much to take   dronabinol 2.5 MG capsule Commonly known as: MARINOL Take 1 capsule (2.5 mg total) by mouth daily before lunch.   feeding supplement (NEPRO CARB STEADY) Liqd Take 237 mLs by mouth 3 (three) times daily as needed (Supplement).   gabapentin 100 MG capsule Commonly known as: NEURONTIN Take 1 capsule (100 mg total) by mouth at bedtime as needed (pain).   multivitamin Tabs tablet Take 1 tablet by mouth 3 (three) times a week. What changed: additional instructions   oxycodone 5 MG capsule Commonly known as: OXY-IR Take 1-2 capsules (5-10 mg total) by mouth every 8 (eight) hours as needed. What changed: reasons to take this   pantoprazole 40 MG tablet Commonly known as: PROTONIX TAKE 1 TABLET BY MOUTH TWICE A DAY   polyethylene glycol 17 g packet Commonly known as: MiraLax Take 17 g by mouth daily. What changed:   when to take this  reasons to take this   senna-docusate 8.6-50 MG tablet Commonly known as: Senokot-S Take 2 tablets by mouth 2 (two) times daily. What changed:   when to take this  reasons to take this            Durable Medical Equipment  (From admission, onward)         Start     Ordered   08/19/19 1216  For home use only DME Bedside commode  Once    Question:  Patient needs a bedside commode to treat with the following condition  Answer:  Physical deconditioning   08/19/19 1215   08/19/19 1216  For  home use only DME Walker rolling  Once    Question Answer Comment  Walker: With [place]   Patient needs a walker to treat with the following condition Physical deconditioning      08/19/19 1215          VVS, Skin clean, dry and intact without evidence of skin break down, no evidence of skin tears noted.  IV catheter discontinued intact. Site without signs and symptoms of complications. Dressing and pressure applied.  An After Visit Summary was printed and given to the patient.  Patient escorted via [place], and D/C home via private auto.  Ascencion Dike  08/19/2019 2:30 PM

## 2019-08-19 NOTE — Telephone Encounter (Signed)
Pt was discharged home today from [facility] hospital. Palliative care referral to [facility] was made before her discharge. I called [facility] and spoke with their referral coordinator, particularly if she is able to continue hemodialysis if she enrolls to hospice program.  The referral coordinator will discuss with their MD, and further discuss with pt and her family when they meet. I called pt's daughter Cecille Rubin and relayed that information, and encourage her to consider hospice if HD will be allowed, she voiced good understanding and appreciated the call.   Her F/U appointment will be open at this point, Cecille Rubin knows to call me if she has any concerns, and I can always do virtual visit with her if needed.  We will continue supportive care.  Truitt Merle  08/19/2019

## 2019-08-19 NOTE — Procedures (Signed)
Seen and examined on dialysis at 8:00 am.  Procedure supervised. Blood pressure 114/70 and HR 75.  Tolerating goal.  Left AVF in use. BF 400.  Denies dizziness or cramping.  No bloody or dark stool overnight.  Hb stable.  Claudia Desanctis, MD 08/19/2019   8:02 AM

## 2019-08-19 NOTE — TOC Transition Note (Signed)
Transition of Care [facility]) - CM/SW Discharge Note   Patient Details  Name: Mary Chapman MRN: [id number] Date of Birth: [birth year]  [facility] CM/SW Contact:  Maryclare Labrador, RN Phone Number: 08/19/2019, 1:15 PM   Clinical Narrative:   Pt deemed stable for discharge home today - pt's daughter will be with her 24 hours a day at discharge .  CM informed Alvis Lemmings that pt will discharge home today and the need for OT in addition to PT - agency accepts.  Pt's daughter/pt in agreement with DME as ordered - [facility] chosen - referral accepted.  Pt has PCP and denied barriers with paying for medications. No other CM needs determined - CM signing off    Final next level of care: [place] Barriers to Discharge: Barriers Resolved   Patient Goals and CMS Choice Patient states their goals for this hospitalization and ongoing recovery are:: Pt states she is ready to get back home CMS Medicare.gov Compare Post Acute Care list provided to:: Patient Choice offered to / list presented to : Patient, Adult Children  Discharge Placement                       Discharge Plan and Services     Post Acute Care Choice: (outpt Palliative Progam)          DME Arranged: 3-N-1, Walker rolling DME Agency: [facility] Date DME Agency Contacted: 08/19/19 Time DME Agency Contacted: [id number] Representative spoke with at DME Agency: [place]: PT [place]: [place] Date [place]: 08/18/19 Time [place]: 1520 Representative spoke with at [place]: [place] ([place]) Interventions     Readmission Risk Interventions Readmission Risk Prevention Plan 08/17/2019  Transportation Screening Complete  Medication Review Press photographer) Complete  HRI or [place] (No Data)  Some recent data might be hidden

## 2019-08-19 NOTE — Progress Notes (Signed)
[facility] Progress Note   Subjective: Seen on HD. Says she is going home today.   Objective Vitals:   08/19/19 0845 08/19/19 0915 08/19/19 0945 08/19/19 1015  BP: 112/72 115/76 116/72 111/68  Pulse: 77 78 78 78  Resp: 18 16 14 16   Temp:      TempSrc:      SpO2:      Weight:      Height:       Physical Exam General: Chronically ill appearing female in NAD Heart:S1,S2, RRR Lungs: CTAB A/P Abdomen: distended, firm.  Extremities: No LE edema Dialysis Access: L AVF cannulated at present.   Additional Objective Labs: Basic Metabolic Panel: Recent Labs  Lab 08/16/19 0343 08/17/19 0405 08/19/19 0319  NA 133* 130* 136  K 5.0 4.7 4.2  CL 97* 96* 95*  CO2 25 22 27   GLUCOSE 91 96 97  BUN 42* 49* 29*  CREATININE 7.63* 8.37* 6.80*  CALCIUM 7.8* 7.9* 8.2*  PHOS  --  2.8 2.7   Liver Function Tests: Recent Labs  Lab 08/14/19 1928 08/17/19 0405 08/19/19 0319  AST 20  --   --   ALT 8  --   --   ALKPHOS 145*  --   --   BILITOT 0.7  --   --   PROT 6.5  --   --   ALBUMIN 2.3* 2.0* 1.9*   No results for input(s): LIPASE, AMYLASE in the last 168 hours. CBC: Recent Labs  Lab 08/13/19 0336 08/13/19 0336 08/14/19 1928 08/14/19 1928 08/16/19 0343 08/16/19 0343 08/16/19 1425 08/17/19 0405 08/19/19 0319  WBC 8.2   < > 11.4*   < > 10.1  --   --  10.5 9.5  NEUTROABS  --   --  9.0*  --   --   --   --  8.2*  --   HGB 7.4*   < > 7.3*   < > 6.7*   < > 7.7* 8.4* 8.5*  HCT 26.4*   < > 25.6*   < > 22.5*   < > 25.2* 27.6* 30.1*  MCV 86.0  --  85.3  --  81.5  --   --  81.2 85.3  PLT 348   < > 360   < > 360  --   --  407* 467*   < > = values in this interval not displayed.   Blood Culture    Component Value Date/Time   SDES  08/15/2019 1251    IN/OUT CATH URINE Performed at [facility], [address] Madelaine Bhat [place], [place] [address]    [facility]  08/15/2019 1251    NONE Performed at [facility], [address] [place], [place]  [address]    CULT  08/15/2019 1251    NO GROWTH Performed at [facility], [place] [address], [place],  [address]    REPTSTATUS 08/16/2019 FINAL 08/15/2019 1251    Cardiac Enzymes: No results for input(s): CKTOTAL, CKMB, CKMBINDEX, TROPONINI in the last 168 hours. CBG: Recent Labs  Lab 08/12/19 1852  GLUCAP 99   Iron Studies: No results for input(s): IRON, TIBC, TRANSFERRIN, FERRITIN in the last 72 hours. @lablastinr3 @ Studies/Results: No results found. Medications: . sodium chloride    . sodium chloride    . [START ON 08/20/2019] sodium chloride    . [START ON 08/20/2019] sodium chloride     . sodium chloride   Intravenous Once  . amLODipine  10 mg Oral QHS  . apixaban  5 mg Oral BID  . Chlorhexidine Gluconate Cloth  6 each Topical Daily  . dronabinol  2.5 mg Oral QAC lunch  . folic acid  1 mg Oral Daily  . metoprolol tartrate  50 mg Oral BID  . multivitamin  1 tablet Oral Once per day on Mon Wed Fri  . pantoprazole  40 mg Oral BID  . sodium chloride flush  10-40 mL Intracatheter Q12H  . sodium chloride flush  3 mL Intravenous Q12H     Dialysis Orders: [facility] MWF 3.75h 400/800 EDW 54kg 2K/2.5Ca UFP 2 AVF -Heparin 4000 units IV initial bolus. Heparin 2000 units IV midrun  -Hectorol 6 mcg IV TIW Venofer 100 mg IV q HD X 10 doses started 08/15/2019 Mircera 225 mcg IV q 2 weeks (last 3/22)   Assessment/Plan: 1.Fever/leukocytosis on admission. CXR clear. COVID negative. Urine culture neg. Blood cultures neg to date. Antibiotics stopped. Etiology appears noninfectious.  2. ESRD -HDMWF. HD today on schedule. K+ 4.7. Usual heparin  3. HTN/volume-BP stable. EDW just lowered. Weights variable. UF as tolerated. 4. Anemia-Hgb 6.7> 8.4 s/p 1 unit prbcs 3/30 On high dose ESA as outpatient. Getting Fe load as OP.  5.MBD of CKD -Corr Ca/ ok. Phos ok/low w/o binders. Continue hectorol 6. Nutrition -ongoing anorexia. Renal diet/vitamins/prot supp  7. Pulmonary  embolism - dx last admission. On Eliquis 2.5 bid  8. Progressive cholangiocarcinoma. Followed by Dr. Burr Medico. Limited treatment options at this point. Continue ongoing goals of care discussions. Primary will arrange for outpatient follow-up with palliative care.   Trinidad Ingle H. Marchele Decock NP-C 08/19/2019, 11:06 AM  [employer] [phone]

## 2019-08-19 NOTE — Progress Notes (Signed)
Occupational Therapy Treatment Patient Details Name: Mary Chapman MRN: [id number] DOB: [birth year] Today's Date: 08/19/2019    History of present illness Mary Chapman is a 74 y.o. female with medical history significant of HTN, intrahepatic cholangiocarcinoma, ESRD on HD(M/W/F), ED and  pulmonary embolus 3/24 on Eliquis with oxygen as needed.  Presents with complaints of fever and confusion.    OT comments  Pt with planned discharge today, so session focused on pt/family education and DME training. Pt Supervision for bed mobility with use of bed rails. Pt supervision for sit to stand at bedside with RW implementing proper hand placement when cued. Pt min guard progressing to supervision for mobility to/from bathroom with intermittent cues for RW use, as this is pt's first time using RW. No overt LOB noted. Pt supervision for toileting task and hand hygiene standing at sink. Provided education that RW will be beneficial on days when feeling weaker to provide additional stability. Provided education on 3 in 1 BSC use to daughter including tub/shower use. Expressed caution if planning to use suction grab bars in shower. Pt/daughter verbalized understanding and agreement for energy conservation strategies to maximize ADL independence/safety at home. Recommend HHOT follow-up at DC.   Follow Up Recommendations  Home health OT;Supervision/Assistance - 24 hour    Equipment Recommendations  3 in 1 bedside commode;Other (comment)(RW)    Recommendations for Other Services      Precautions / Restrictions Precautions Precautions: Fall Restrictions Weight Bearing Restrictions: No       Mobility Bed Mobility Overal bed mobility: Needs Assistance Bed Mobility: Sit to Supine;Supine to Sit     Supine to sit: Supervision Sit to supine: Supervision   General bed mobility comments: Supervision, used bed rail but reports regular bed at home   Transfers Overall transfer level: Needs  assistance Equipment used: Rolling walker (2 wheeled) Transfers: Sit to/from [facility] Sit to Stand: Supervision Stand pivot transfers: Min guard;Supervision       General transfer comment: min guard progressing to supervision    Balance Overall balance assessment: No apparent balance deficits (not formally assessed)                                         ADL either performed or assessed with clinical judgement   ADL Overall ADL's : Needs assistance/impaired     Grooming: Supervision/safety;Standing;Wash/dry hands Grooming Details (indicate cue type and reason): cued for RW mgmt at sink                 Toilet Transfer: Supervision/safety;Ambulation;Regular Toilet;RW;Grab bars Toilet Transfer Details (indicate cue type and reason): Min guard progressing to supervision with ambulation to bathroom, cues for RW mgmt  Toileting- Clothing Manipulation and Hygiene: Supervision/safety;Sit to/from stand Toileting - Clothing Manipulation Details (indicate cue type and reason): No physical assist required     Functional mobility during ADLs: Min guard;Supervision/safety;Rolling walker General ADL Comments: Pt functionally capable of performing tasks, provided education on energy conservation strategies to implement     Vision       Perception     Praxis      Cognition Arousal/Alertness: Awake/alert Behavior During Therapy: WFL for tasks assessed/performed Overall Cognitive Status: Within Functional Limits for tasks assessed  Exercises     Shoulder Instructions       General Comments VSS on RA. Denies dizziness, BP WFL    Pertinent Vitals/ Pain       Pain Assessment: 0-10 Pain Score: 7  Pain Location: Reports liver pain  Pain Descriptors / Indicators: Grimacing;Guarding Pain Intervention(s): Limited activity within patient's tolerance;Monitored during session;Other  (comment)(Reports having pain meds)  Home Living                                          Prior Functioning/Environment              Frequency  Min 2X/week        Progress Toward Goals  OT Goals(current goals can now be found in the care plan section)  Progress towards OT goals: Progressing toward goals  Acute Rehab OT Goals Patient Stated Goal: to go home OT Goal Formulation: With patient Time For Goal Achievement: 09/01/19 Potential to Achieve Goals: Good ADL Goals Pt Will Perform Grooming: with modified independence;standing Pt Will Perform Upper Body Bathing: with set-up;sitting Pt Will Perform Lower Body Bathing: with supervision;sit to/from stand Pt Will Perform Upper Body Dressing: with set-up;sitting Pt Will Perform Lower Body Dressing: with supervision;sit to/from stand Pt Will Transfer to Toilet: with supervision;ambulating;bedside commode Pt Will Perform Toileting - Clothing Manipulation and hygiene: with supervision;sit to/from stand  Plan Discharge plan remains appropriate    Co-evaluation                 AM-PAC OT "6 Clicks" Daily Activity     Outcome Measure   Help from another person eating meals?: None Help from another person taking care of personal grooming?: A Little Help from another person toileting, which includes using toliet, bedpan, or urinal?: A Little Help from another person bathing (including washing, rinsing, drying)?: A Little Help from another person to put on and taking off regular upper body clothing?: None Help from another person to put on and taking off regular lower body clothing?: A Little 6 Click Score: 20    End of Session Equipment Utilized During Treatment: Gait belt;Rolling walker  OT Visit Diagnosis: Unsteadiness on feet (R26.81);Other abnormalities of gait and mobility (R26.89);Muscle weakness (generalized) (M62.81)   Activity Tolerance Patient tolerated treatment well   Patient Left in  bed;with call bell/phone within reach;with bed alarm set;with family/visitor present(Daughter present)   Nurse Communication Mobility status        Time: 1287-8676 OT Time Calculation (min): 29 min  Charges: OT General Charges $OT Visit: 1 Visit OT Treatments $Self Care/Home Management : 8-22 mins $Therapeutic Activity: 8-22 mins  Layla Maw, OTR/L   Layla Maw 08/19/2019, 12:40 PM

## 2019-08-20 LAB — CULTURE, BLOOD (ROUTINE X 2)
Culture: NO GROWTH
Culture: NO GROWTH
Special Requests: ADEQUATE
Special Requests: ADEQUATE

## 2019-08-23 ENCOUNTER — Telehealth: Payer: Self-pay

## 2019-08-23 NOTE — Telephone Encounter (Signed)
Quenton Fetter, Rn from hospice of the [facility] division called stating that after physician review of Ms Bracher needs and continuing hemodialysis, she has been enrolled in palliative care services.

## 2019-08-26 ENCOUNTER — Other Ambulatory Visit: Payer: Self-pay | Admitting: Hematology

## 2019-08-26 ENCOUNTER — Telehealth: Payer: Self-pay

## 2019-08-26 MED ORDER — OXYCODONE HCL 5 MG PO CAPS: 5.0000 mg | ORAL_CAPSULE | ORAL | 0 refills | Status: DC | PRN

## 2019-08-26 MED ORDER — MORPHINE SULFATE ER 15 MG PO TBCR: 15.0000 mg | EXTENDED_RELEASE_TABLET | Freq: Two times a day (BID) | ORAL | 0 refills | Status: DC

## 2019-08-26 NOTE — Telephone Encounter (Signed)
I have called in these pain meds. Please let pt's daughter Mary Chapman know that I am available if she needs [place] or wants to do virtual visit with [place], in additional to home palliative care service.   Truitt Merle MD

## 2019-08-26 NOTE — Telephone Encounter (Signed)
I spoke with Cecille Rubin Ms Fiumara' Daughter and reviewed new pain medication with her and use of oxycodone for break through pain.  She verbalized understanding.  I also told her that if she or Ms Erck' would like to speak with Dr. Burr Medico we can arrange a phone visit.  She verbalized understanding and appreciation. I also spoke with Jeanne Ivan, Rn from [place] letting her know Dr. Burr Medico sent the Rx for new pain medication.

## 2019-08-26 NOTE — Telephone Encounter (Signed)
Jeanne Ivan, RN from [place], palliative care department, called regarding Mary Chapman' pain management. Current regimen is not controlling her pain.  Recommendations from [facility]: Mary contin 15mg  q12h, oxycodone 5-10 mg q4hr prn for breakthrough.

## 2019-09-22 ENCOUNTER — Other Ambulatory Visit: Payer: Self-pay | Admitting: Hematology

## 2019-09-22 ENCOUNTER — Telehealth: Payer: Self-pay

## 2019-09-22 MED ORDER — ONDANSETRON 4 MG PO TBDP: 4.0000 mg | ORAL_TABLET | Freq: Three times a day (TID) | ORAL | 2 refills | Status: AC | PRN

## 2019-09-22 MED ORDER — MORPHINE SULFATE ER 15 MG PO TBCR: 15.0000 mg | EXTENDED_RELEASE_TABLET | Freq: Two times a day (BID) | ORAL | 0 refills | Status: AC

## 2019-09-22 NOTE — Telephone Encounter (Signed)
Tammy RN from the palliative care division [facility] called regarding Mary Chapman.  She states Mary Chapman' pain is well controlled with the Mary contin.  She states there are days she needs to take 2 oxycodone 5mg  tablets 4 times a day and some days takes none.  She als states she has been having issues with intermittent nausea.  She is requesting a refill for Mary contin and new RX for Zofran ODT.

## 2019-09-27 ENCOUNTER — Other Ambulatory Visit: Payer: Self-pay | Admitting: Hematology and Oncology

## 2019-09-28 ENCOUNTER — Telehealth: Payer: Self-pay

## 2019-09-28 NOTE — Telephone Encounter (Signed)
I spoke with Mary Ivan RN from Hospice. We received a message via [facility] 09/27/2019 requesting adjustment of pain medication.  Mary Chapman states that her MS contin has been increased to 30 mg q 12 hours.

## 2019-09-30 ENCOUNTER — Other Ambulatory Visit: Payer: Self-pay | Admitting: Hematology

## 2019-09-30 ENCOUNTER — Telehealth: Payer: Self-pay

## 2019-09-30 MED ORDER — FENTANYL 50 MCG/HR TD PT72: 1.0000 | MEDICATED_PATCH | TRANSDERMAL | 0 refills | Status: AC

## 2019-09-30 MED ORDER — OXYCODONE HCL 15 MG PO TABS: 15.0000 mg | ORAL_TABLET | ORAL | 0 refills | Status: AC | PRN

## 2019-09-30 NOTE — Telephone Encounter (Signed)
I spoke with Mary Chapman this morning.  I reviewed Dr. Ernestina Penna changes for pain management.  I reviewed use of duragesic patch.  I instructed her to continue Mary contin for 2 doses after applying the duragesic patch.  She verbalized understanding

## 2019-10-10 ENCOUNTER — Telehealth: Payer: Self-pay

## 2019-10-10 NOTE — Telephone Encounter (Signed)
Received fax notification from [facility] [place] of Mary Chapman death on 2019-11-02.

## 2019-10-18 DEATH — deceased

## 2019-10-31 ENCOUNTER — Other Ambulatory Visit: Payer: Self-pay | Admitting: Hematology

## 2019-10-31 DIAGNOSIS — C221 Intrahepatic bile duct carcinoma: Secondary | ICD-10-CM

## 2019-12-01 ENCOUNTER — Encounter: Payer: Self-pay | Admitting: Genetic Counselor

## 2021-03-10 IMAGING — CT CT CERVICAL SPINE WITHOUT CONTRAST
3 of 4 series · 14 of 33 positions shown, 17 images · non-contrast
Comparison: None.

CLINICAL DATA: Loss of consciousness.  Head trauma.

EXAM:
CT HEAD WITHOUT CONTRAST
CT CERVICAL SPINE WITHOUT CONTRAST
TECHNIQUE: Multidetector CT imaging of the head and cervical spine was
performed following the standard protocol without intravenous
contrast. Multiplanar CT image reconstructions of the cervical spine
were also generated.

[Series 6: c_spine 2.0 sag bone · sagittal · 0.37mm/px · 5 of 61 slices shown, 6 images]
[im 21/61  bone]
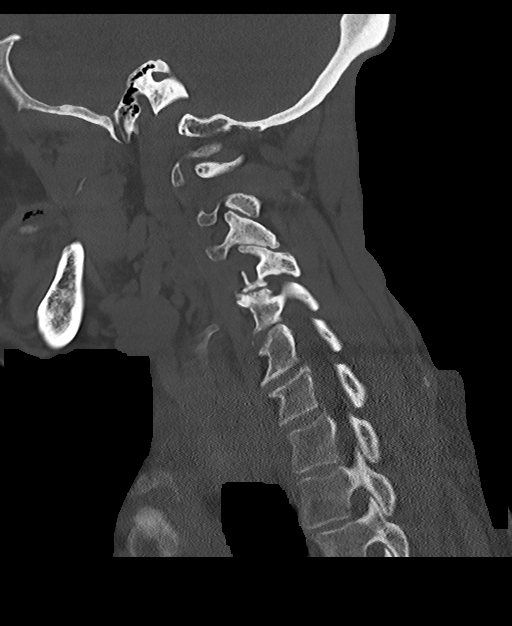
[im 26/61  bone]
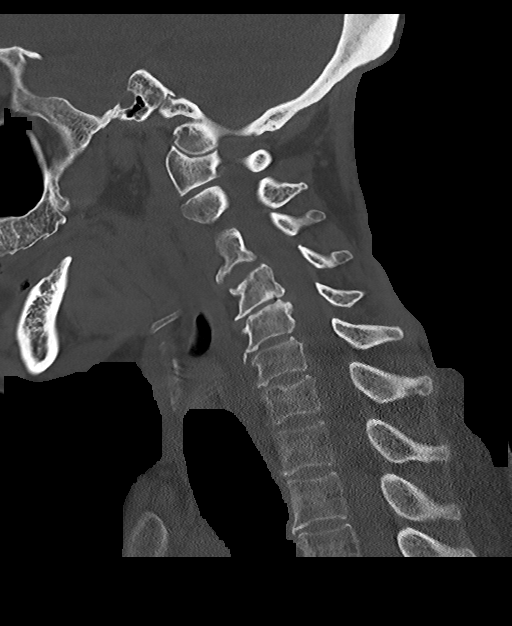
[im 31/61  soft-tissue]
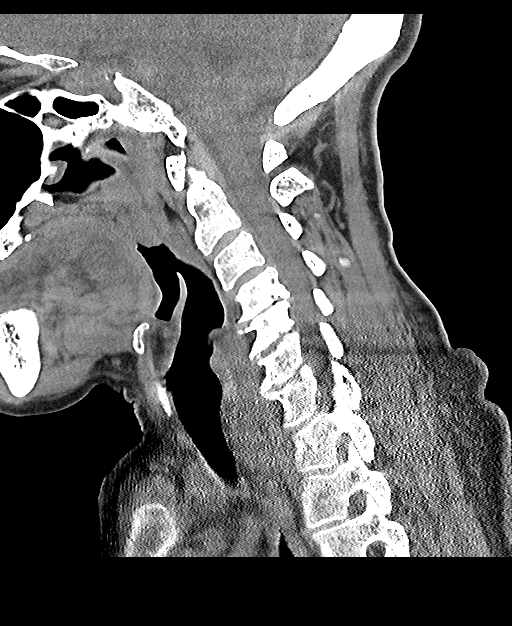
[im 31/61  bone]
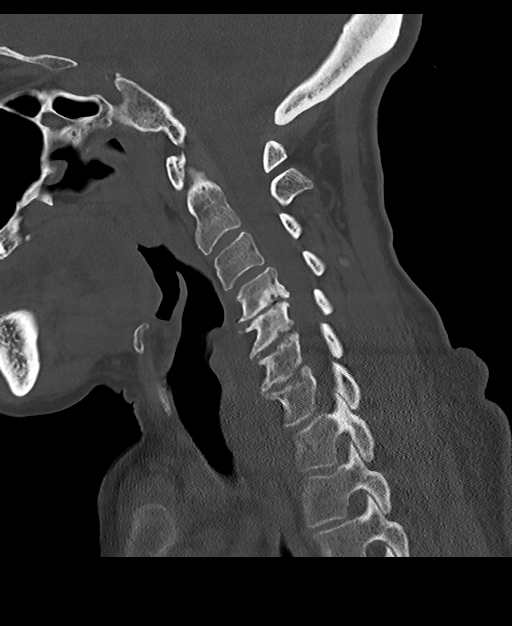
[im 36/61  bone]
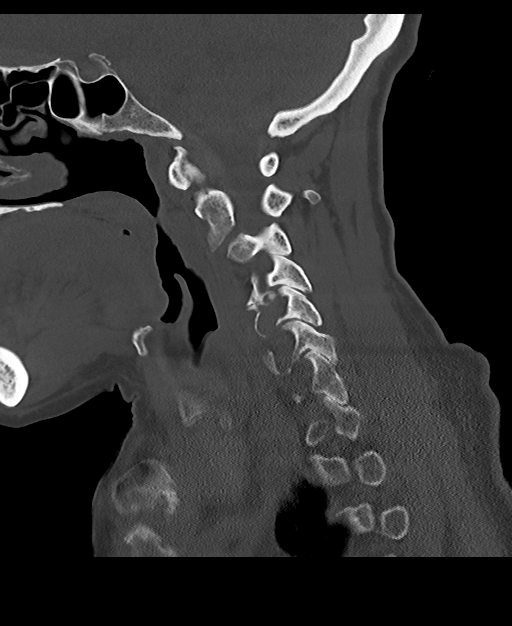
[im 41/61  bone]
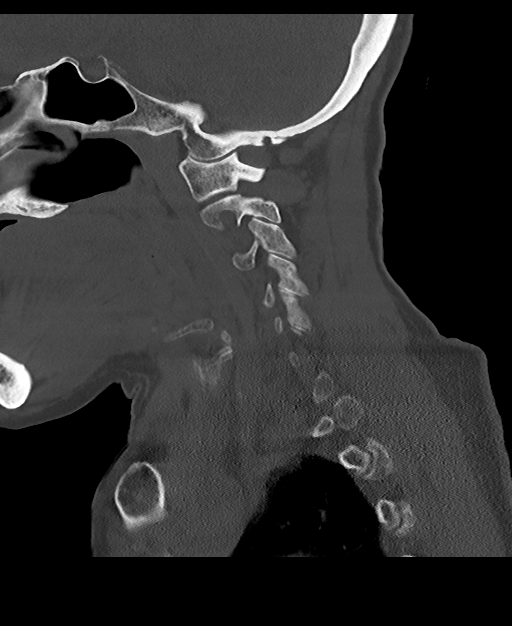

[Series 7: c_spine 2.0 cor bone · coronal · 0.43mm/px · 3 of 76 slices shown]
[im 16/76  bone]
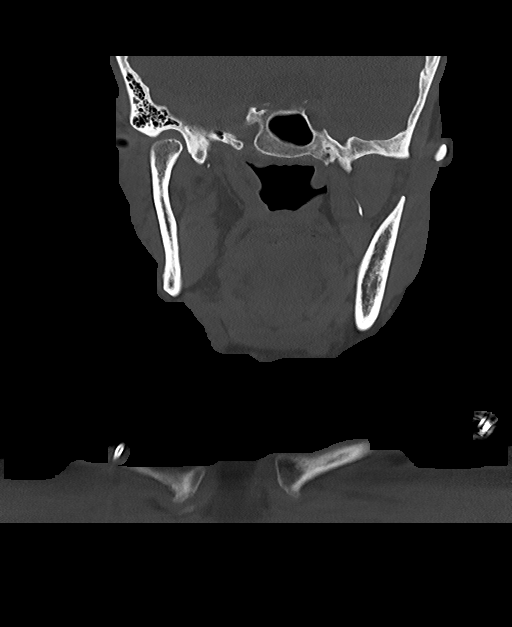
[im 31/76  bone]
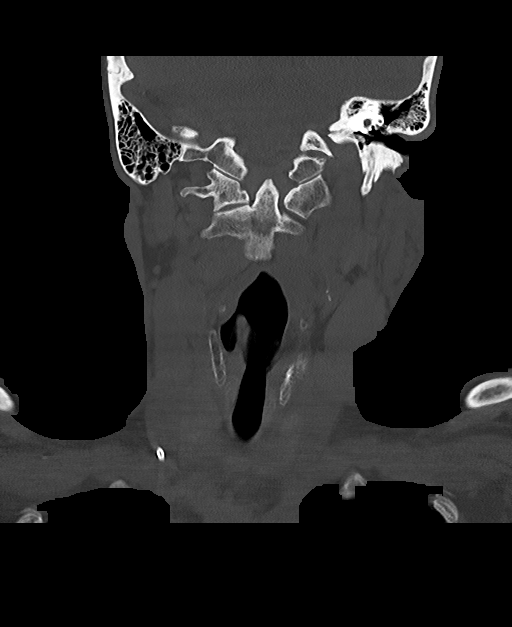
[im 46/76  bone]
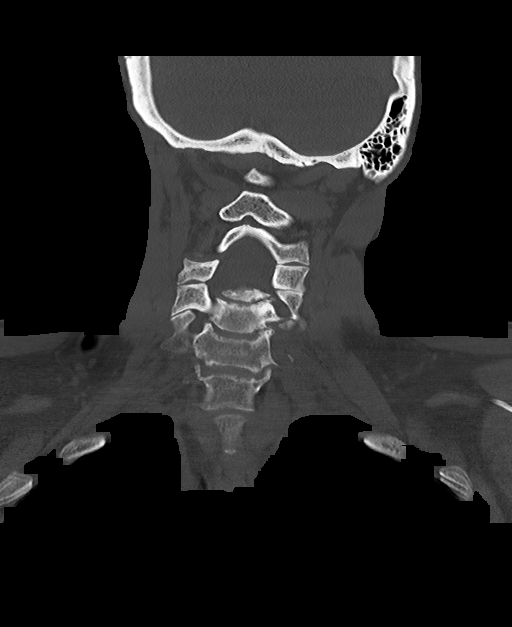

[Series 9: c_spine 1.0 st thins · axial · 0.39mm/px · z∈[-297,-139]mm · 6 of 283 slices shown, 8 images]
[im 29/283  soft-tissue]
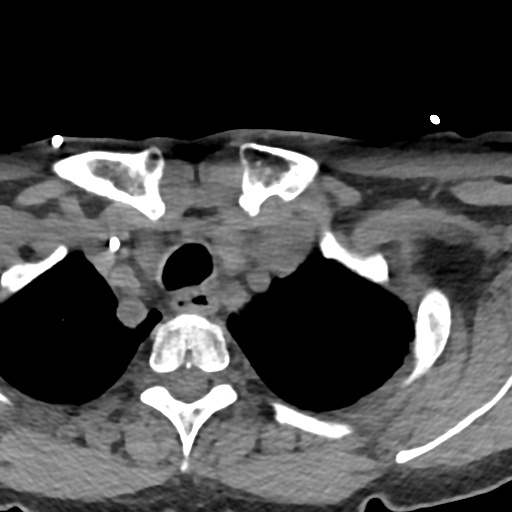
[im 29/283  bone]
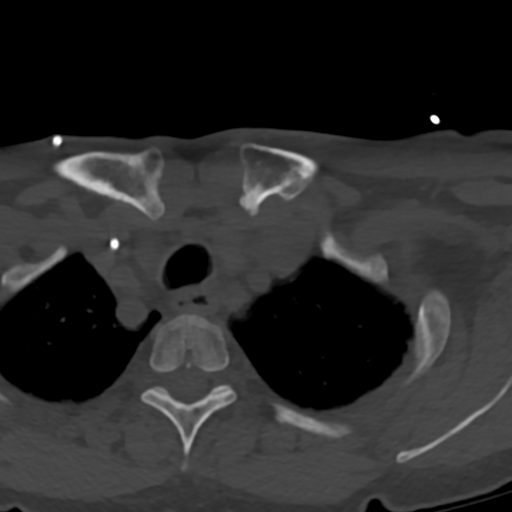
[im 85/283  bone]
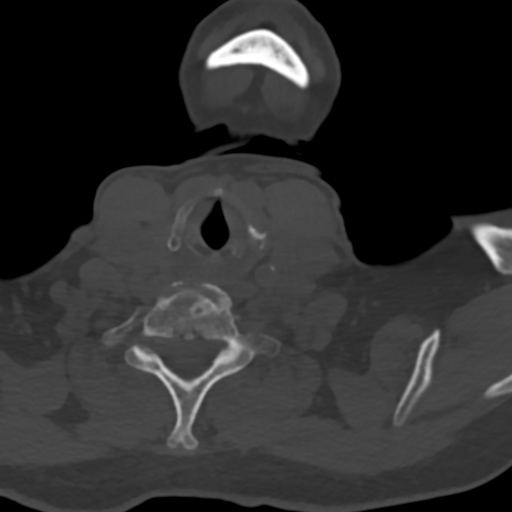
[im 113/283  bone]
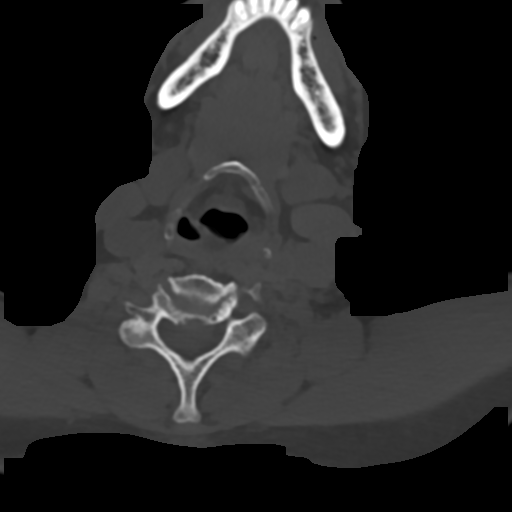
[im 170/283  bone]
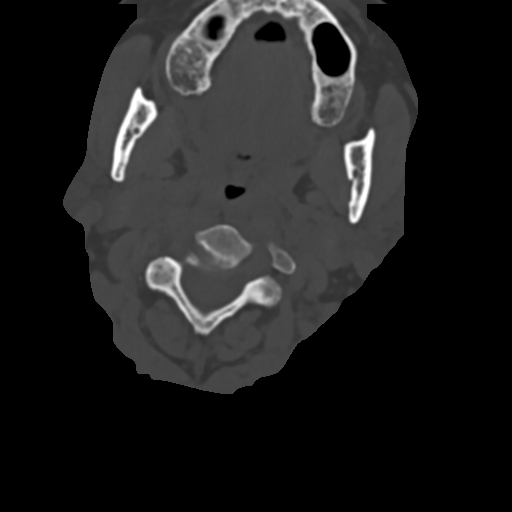
[im 198/283  soft-tissue]
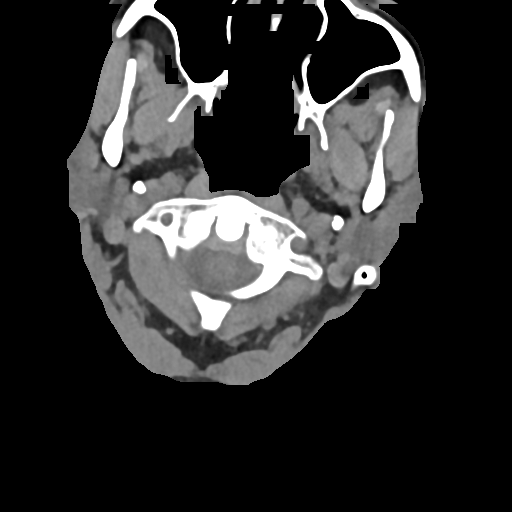
[im 198/283  bone]
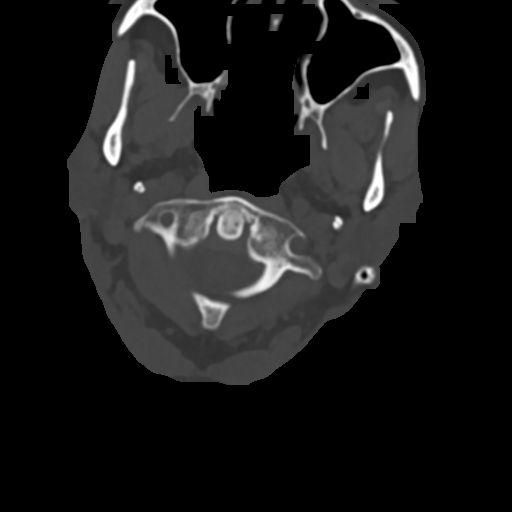
[im 254/283  bone]
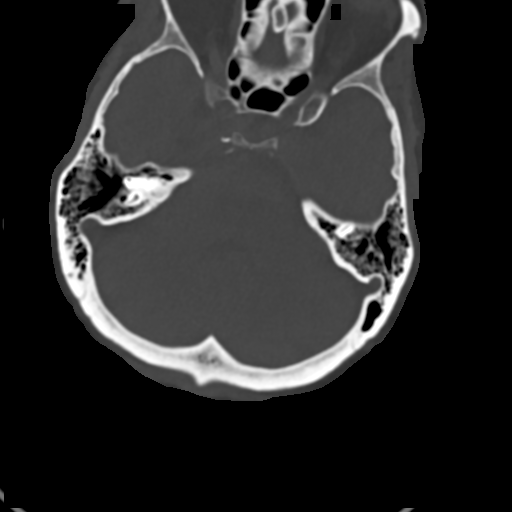

[14 of 33 positions shown; findings below may reference images not displayed]

FINDINGS: CT HEAD FINDINGS

Brain: No evidence of acute infarction, hemorrhage, hydrocephalus,
extra-axial collection or mass lesion/mass effect.

Vascular: No hyperdense vessel or unexpected calcification.

Skull: Normal. Negative for fracture or focal lesion.

Sinuses/Orbits: Normal globes and orbits. Visualized sinuses and
mastoid air cells are clear.

Other: None.

CT CERVICAL SPINE FINDINGS

Alignment: Neck positioned in flexion.  No spondylolisthesis.

Skull base and vertebrae: No acute fracture. No primary bone lesion
or focal pathologic process.

Soft tissues and spinal canal: No prevertebral fluid or swelling. No
visible canal hematoma.

Disc levels: Mild loss of disc height at C3-C4. Moderate to marked
loss of disc height at C4-C5. Moderate loss of disc height at C5-C6
and C6-C7. Mild spondylotic disc bulging noted at these levels. No
convincing disc herniation.

Upper chest: No acute finding. 1.5 cm hypoattenuating right thyroid
nodule. Emphysema noted at the lung apices.

Other: None.
IMPRESSION: HEAD CT

1. Normal exam.

CERVICAL CT

1. No fracture or acute finding.
2. **An incidental finding of potential clinical significance has
been found. 1.5 cm right thyroid lobe nodule. Consider further
evaluation with thyroid ultrasound, if this has not been previously
performed. If patient is clinically hyperthyroid, consider nuclear
medicine thyroid uptake and scan.**

## 2021-12-09 ENCOUNTER — Other Ambulatory Visit: Payer: Self-pay
